# Patient Record
Sex: Male | Born: 1940
Health system: Southern US, Community
[De-identification: ages and names within clinical notes are randomized; demographics above are authoritative.]

## PROBLEM LIST (undated history)

## (undated) DIAGNOSIS — R310 Gross hematuria: Secondary | ICD-10-CM

## (undated) DIAGNOSIS — N401 Enlarged prostate with lower urinary tract symptoms: Secondary | ICD-10-CM

## (undated) DIAGNOSIS — I259 Chronic ischemic heart disease, unspecified: Secondary | ICD-10-CM

## (undated) DIAGNOSIS — K219 Gastro-esophageal reflux disease without esophagitis: Secondary | ICD-10-CM

## (undated) DIAGNOSIS — N281 Cyst of kidney, acquired: Secondary | ICD-10-CM

## (undated) DIAGNOSIS — E785 Hyperlipidemia, unspecified: Secondary | ICD-10-CM

## (undated) DIAGNOSIS — Z8719 Personal history of other diseases of the digestive system: Secondary | ICD-10-CM

## (undated) DIAGNOSIS — G4730 Sleep apnea, unspecified: Secondary | ICD-10-CM

## (undated) DIAGNOSIS — N200 Calculus of kidney: Secondary | ICD-10-CM

## (undated) DIAGNOSIS — I219 Acute myocardial infarction, unspecified: Secondary | ICD-10-CM

## (undated) DIAGNOSIS — N138 Other obstructive and reflux uropathy: Secondary | ICD-10-CM

## (undated) DIAGNOSIS — I2699 Other pulmonary embolism without acute cor pulmonale: Secondary | ICD-10-CM

## (undated) DIAGNOSIS — M1990 Unspecified osteoarthritis, unspecified site: Secondary | ICD-10-CM

## (undated) DIAGNOSIS — I2510 Atherosclerotic heart disease of native coronary artery without angina pectoris: Secondary | ICD-10-CM

## (undated) DIAGNOSIS — J189 Pneumonia, unspecified organism: Secondary | ICD-10-CM

## (undated) DIAGNOSIS — R972 Elevated prostate specific antigen [PSA]: Secondary | ICD-10-CM

## (undated) DIAGNOSIS — N529 Male erectile dysfunction, unspecified: Secondary | ICD-10-CM

## (undated) DIAGNOSIS — I499 Cardiac arrhythmia, unspecified: Secondary | ICD-10-CM

## (undated) DIAGNOSIS — R319 Hematuria, unspecified: Secondary | ICD-10-CM

## (undated) DIAGNOSIS — I509 Heart failure, unspecified: Secondary | ICD-10-CM

## (undated) DIAGNOSIS — D510 Vitamin B12 deficiency anemia due to intrinsic factor deficiency: Secondary | ICD-10-CM

## (undated) DIAGNOSIS — Z9289 Personal history of other medical treatment: Secondary | ICD-10-CM

## (undated) DIAGNOSIS — I10 Essential (primary) hypertension: Secondary | ICD-10-CM

## (undated) DIAGNOSIS — Z87442 Personal history of urinary calculi: Secondary | ICD-10-CM

## (undated) DIAGNOSIS — G629 Polyneuropathy, unspecified: Secondary | ICD-10-CM

## (undated) DIAGNOSIS — M4800 Spinal stenosis, site unspecified: Secondary | ICD-10-CM

## (undated) HISTORY — PX: BACK SURGERY: SHX140

## (undated) HISTORY — PX: OTHER SURGICAL HISTORY: SHX169

## (undated) HISTORY — DX: Other obstructive and reflux uropathy: N13.8

## (undated) HISTORY — DX: Hyperlipidemia, unspecified: E78.5

## (undated) HISTORY — DX: Other pulmonary embolism without acute cor pulmonale: I26.99

## (undated) HISTORY — DX: Hematuria, unspecified: R31.9

## (undated) HISTORY — DX: Vitamin B12 deficiency anemia due to intrinsic factor deficiency: D51.0

## (undated) HISTORY — DX: Spinal stenosis, site unspecified: M48.00

## (undated) HISTORY — DX: Chronic ischemic heart disease, unspecified: I25.9

## (undated) HISTORY — DX: Cardiac arrhythmia, unspecified: I49.9

## (undated) HISTORY — DX: Atherosclerotic heart disease of native coronary artery without angina pectoris: I25.10

## (undated) HISTORY — DX: Calculus of kidney: N20.0

## (undated) HISTORY — DX: Male erectile dysfunction, unspecified: N52.9

## (undated) HISTORY — DX: Other obstructive and reflux uropathy: N40.1

## (undated) HISTORY — DX: Personal history of urinary calculi: Z87.442

## (undated) HISTORY — DX: Sleep apnea, unspecified: G47.30

## (undated) HISTORY — DX: Elevated prostate specific antigen (PSA): R97.20

## (undated) HISTORY — PX: LAMINECTOMY: SHX219

## (undated) HISTORY — PX: CHOLECYSTECTOMY: SHX55

## (undated) HISTORY — DX: Heart failure, unspecified: I50.9

## (undated) HISTORY — DX: Morbid (severe) obesity due to excess calories: E66.01

## (undated) HISTORY — DX: Cyst of kidney, acquired: N28.1

## (undated) HISTORY — DX: Gross hematuria: R31.0

## (undated) HISTORY — DX: Polyneuropathy, unspecified: G62.9

## (undated) HISTORY — PX: APPENDECTOMY: SHX54

---

## 1989-09-06 DIAGNOSIS — I259 Chronic ischemic heart disease, unspecified: Secondary | ICD-10-CM

## 1989-09-06 HISTORY — DX: Chronic ischemic heart disease, unspecified: I25.9

## 1993-09-06 DIAGNOSIS — J189 Pneumonia, unspecified organism: Secondary | ICD-10-CM

## 1993-09-06 HISTORY — DX: Pneumonia, unspecified organism: J18.9

## 2005-07-23 ENCOUNTER — Ambulatory Visit: Payer: Self-pay | Admitting: Urology

## 2005-08-10 ENCOUNTER — Other Ambulatory Visit: Payer: Self-pay

## 2005-08-10 ENCOUNTER — Ambulatory Visit: Payer: Self-pay | Admitting: Surgery

## 2005-08-13 ENCOUNTER — Ambulatory Visit: Payer: Self-pay | Admitting: Surgery

## 2006-01-25 ENCOUNTER — Ambulatory Visit: Payer: Self-pay | Admitting: Internal Medicine

## 2006-04-23 ENCOUNTER — Inpatient Hospital Stay: Payer: Self-pay | Admitting: Internal Medicine

## 2006-04-23 ENCOUNTER — Other Ambulatory Visit: Payer: Self-pay

## 2006-04-24 ENCOUNTER — Other Ambulatory Visit: Payer: Self-pay

## 2006-04-25 ENCOUNTER — Other Ambulatory Visit: Payer: Self-pay

## 2006-06-08 ENCOUNTER — Ambulatory Visit: Payer: Self-pay | Admitting: Unknown Physician Specialty

## 2006-09-22 ENCOUNTER — Ambulatory Visit: Payer: Self-pay | Admitting: Pain Medicine

## 2006-10-05 ENCOUNTER — Ambulatory Visit: Payer: Self-pay | Admitting: Pain Medicine

## 2006-11-10 ENCOUNTER — Ambulatory Visit: Payer: Self-pay | Admitting: Pain Medicine

## 2006-11-21 ENCOUNTER — Ambulatory Visit: Payer: Self-pay | Admitting: Pain Medicine

## 2006-12-20 ENCOUNTER — Ambulatory Visit: Payer: Self-pay | Admitting: Pain Medicine

## 2007-03-28 ENCOUNTER — Ambulatory Visit: Payer: Self-pay | Admitting: Internal Medicine

## 2007-04-12 ENCOUNTER — Ambulatory Visit: Payer: Self-pay | Admitting: Unknown Physician Specialty

## 2007-05-10 ENCOUNTER — Ambulatory Visit: Payer: Self-pay | Admitting: Neurology

## 2007-07-26 ENCOUNTER — Other Ambulatory Visit: Payer: Self-pay

## 2007-07-26 ENCOUNTER — Ambulatory Visit: Payer: Self-pay | Admitting: Unknown Physician Specialty

## 2007-07-31 ENCOUNTER — Inpatient Hospital Stay: Payer: Self-pay | Admitting: Unknown Physician Specialty

## 2008-05-06 ENCOUNTER — Ambulatory Visit: Payer: Self-pay | Admitting: Urology

## 2008-09-06 DIAGNOSIS — I2510 Atherosclerotic heart disease of native coronary artery without angina pectoris: Secondary | ICD-10-CM

## 2008-09-06 HISTORY — DX: Atherosclerotic heart disease of native coronary artery without angina pectoris: I25.10

## 2009-05-13 ENCOUNTER — Inpatient Hospital Stay: Payer: Self-pay | Admitting: Internal Medicine

## 2009-05-14 ENCOUNTER — Encounter: Payer: Self-pay | Admitting: Cardiovascular Disease

## 2009-05-15 ENCOUNTER — Encounter: Payer: Self-pay | Admitting: Cardiovascular Disease

## 2010-04-01 ENCOUNTER — Ambulatory Visit: Payer: Self-pay | Admitting: Unknown Physician Specialty

## 2010-04-06 ENCOUNTER — Ambulatory Visit: Payer: Self-pay | Admitting: Oncology

## 2010-04-20 ENCOUNTER — Ambulatory Visit: Payer: Self-pay | Admitting: Unknown Physician Specialty

## 2010-04-23 ENCOUNTER — Inpatient Hospital Stay: Payer: Self-pay | Admitting: Unknown Physician Specialty

## 2010-04-25 ENCOUNTER — Encounter: Payer: Self-pay | Admitting: Cardiovascular Disease

## 2010-04-26 ENCOUNTER — Encounter: Payer: Self-pay | Admitting: Cardiovascular Disease

## 2010-05-01 ENCOUNTER — Ambulatory Visit: Payer: Self-pay | Admitting: Cardiovascular Disease

## 2010-05-07 ENCOUNTER — Ambulatory Visit: Payer: Self-pay | Admitting: Oncology

## 2010-05-15 ENCOUNTER — Ambulatory Visit: Payer: Self-pay | Admitting: Oncology

## 2010-05-28 ENCOUNTER — Ambulatory Visit: Payer: Self-pay | Admitting: Cardiovascular Disease

## 2010-05-28 DIAGNOSIS — E785 Hyperlipidemia, unspecified: Secondary | ICD-10-CM

## 2010-05-28 DIAGNOSIS — I2510 Atherosclerotic heart disease of native coronary artery without angina pectoris: Secondary | ICD-10-CM | POA: Insufficient documentation

## 2010-05-28 DIAGNOSIS — I4891 Unspecified atrial fibrillation: Secondary | ICD-10-CM | POA: Insufficient documentation

## 2010-06-06 ENCOUNTER — Ambulatory Visit: Payer: Self-pay | Admitting: Oncology

## 2010-10-06 NOTE — Assessment & Plan Note (Signed)
Summary: NP6/AMD   Visit Type:  Initial Consult Primary Alexandrya Chim:  Darrol Poke.  CC:  F/U [facility] from back surgery with an abnormal EKG..  History of Present Illness: Mr. Niess is a 70 year old gentleman with a history of coronary artery disease, PTCA in 1991 of his RCA that later occluded 100%, with chest pain in September 2010 and stent placed to his LAD, also a history of atrial fibrillation following recent back surgery in the spontaneously converted with improved pain control presents to establish care. he does have diabetes and complications including lower extremity neuropathy.  He reports that since his discharge following his back surgery for lumbar spinal stenosis and DJD, he has felt very fatigued. He did participate in physical therapy though could not afford to keep going with this treatment. He denies any chest pain, shortness of breath, lower extremity edema. He denies any tachycardia palpitations. He has not had atrial fibrillation before 05/2010. He has not been taking his simvastatin and asked to take a "break "once in a while. He is unable to tolerate aspirin due to her history of GI problems 20 years ago  Cardiac catheterization September 2000 and details a 75% proximal LAD lesion, 100% RCA lesion, 30% left circumflex lesion a xience 2.75 x 15 mm DES stent was placed.  EKG shows normal sinus rhythm with a rate of 89 beats per minute, no significant ST or T wave changes    Preventive Screening-Counseling & Management  Alcohol-Tobacco     Smoking Status: never  Caffeine-Diet-Exercise     Does Patient Exercise: yes      Drug Use:  no.    Current Medications (verified): 1)  B-12 Injection .... One Monthly 2)  Effient 10 Mg Tabs (Prasugrel Hcl) .... One Tablet Once Daily 3)  Th Fish Oil Extra Strength 1200 Mg Caps (Omega-3 Fatty Acids) .... Two Tablets Once Daily 4)  Glimepiride 4 Mg Tabs (Glimepiride) .... One Tablet Once Daily 5)  Hydrochlorothiazide 25 Mg Tabs  (Hydrochlorothiazide) .... One Tablet Once Daily 6)  Omeprazole 20 Mg Cpdr (Omeprazole) .... One Tablet Once Daily 7)  Percocet 5-325 Mg Tabs (Oxycodone-Acetaminophen) .Marland Kitchen.. 1 Tablet Every Four Hours As Needed 8)  Simvastatin 40 Mg Tabs (Simvastatin) .... One Tablet Once Daily 9)  Valium 5 Mg Tabs (Diazepam) .... One Tablet As Needed 10)  Viagra 100 Mg Tabs (Sildenafil Citrate) .... One Tablet Once Daily As Needed 11)  Vitamin D 2000 I.u. .... One Tablet Daily  Allergies (verified): 1)  ! * Nitroglycerin 2)  ! * Novacaine  Past History:  Family History: Last updated: 06-18-10 Mother: CHF; deceased Father:  Deceased   Siblings: 1 Brother deceased MI age 56                3 living sisters; 2 with tachycardia.  Social History: Last updated: 18-Jun-2010 Married  Retired  Tobacco Use - No.  Alcohol Use - yes--rare Regular Exercise - yes--walks. Drug Use - no  Risk Factors: Exercise: yes (2010-06-18)  Risk Factors: Smoking Status: never (June 18, 2010)  Past Medical History: CAD; 2010 (stent) Ischemic heart disease with an angioplasty in 1991 Hyperlipidemia spinal stenosis pernicious anemia peripheral neuropathy elevated PSA previous pulmonary embolus morbid obesity sleep apnea kidney stones diabetes  Past Surgical History: CAD; stent 2010 Bilateral inguinal hernia repair cholecystectomy appendectomy laminectomy  Family History: Mother: CHF; deceased Father:  Deceased   Siblings: 1 Brother deceased MI age 24  3 living sisters; 2 with tachycardia.  Social History: Married  Retired  Tobacco Use - No.  Alcohol Use - yes--rare Regular Exercise - yes--walks. Drug Use - no Smoking Status:  never Does Patient Exercise:  yes Drug Use:  no  Review of Systems       The patient complains of dyspnea on exertion.  The patient denies fever, weight loss, weight gain, vision loss, decreased hearing, hoarseness, chest pain, syncope, peripheral edema,  prolonged cough, abdominal pain, incontinence, muscle weakness, depression, and enlarged lymph nodes.         Fatigue and back pain  Vital Signs:  Patient profile:   70 year old male Height:      73 inches Weight:      277 pounds BMI:     36.68 Pulse rate:   89 / minute BP sitting:   152 / 77  (left arm) Cuff size:   large  Vitals Entered By: Bishop Dublin, CMA (May 28, 2010 11:00 AM)  Physical Exam  General:  Well developed, well nourished, in no acute distress. Head:  normocephalic and atraumatic Neck:  Neck supple, no JVD. No masses, thyromegaly or abnormal cervical nodes. Lungs:  Clear bilaterally to auscultation and percussion. Heart:  Non-displaced PMI, chest non-tender; regular rate and rhythm, S1, S2 without murmurs, rubs or gallops. Carotid upstroke normal, no bruit. Pedals normal pulses. No edema, no varicosities. Abdomen:   abdomen soft and non-tender without masses, obese Msk:  Back normal, normal gait. Muscle strength and tone normal. Pulses:  diminished pulses in his lower extremities, trace to 1+ bilaterally Extremities:  No clubbing or cyanosis. Neurologic:  Alert and oriented x 3. Skin:  Intact without lesions or rashes. Psych:  Normal affect.   Impression & Recommendations:  Problem # 1:  CAD (ICD-414.00) history of coronary artery disease. He has not been taking his simvastatin. We have encouraged him to restart his simvastatin. Will try to obtain his most recent lipid panel for our records  His updated medication list for this problem includes:    Effient 10 Mg Tabs (Prasugrel hcl) ..... One tablet once daily    Metoprolol Tartrate 25 Mg Tabs (Metoprolol tartrate) .Marland Kitchen... Take one tablet by mouth twice a day  Problem # 2:  ATRIAL FIBRILLATION (ICD-427.31) Postop mitral fibrillation following back surgery. We will start him on metoprolol 25 mg b.i.d. for improved rhythm control. His wife reports that he reaches heart rates in the 100s with minimal  exertion.  His updated medication list for this problem includes:    Effient 10 Mg Tabs (Prasugrel hcl) ..... One tablet once daily    Metoprolol Tartrate 25 Mg Tabs (Metoprolol tartrate) .Marland Kitchen... Take one tablet by mouth twice a day  Problem # 3:  HYPERLIPIDEMIA-MIXED (ICD-272.4) He has not been taking his simvastatin. We have encouraged him to restart the medication.  His updated medication list for this problem includes:    Simvastatin 40 Mg Tabs (Simvastatin) ..... One tablet once daily  Patient Instructions: 1)  Your physician has recommended you make the following change in your medication: START Metoprolol two times a day  2)  Your physician wants you to follow-up in:  6 months  You will receive a reminder letter in the mail two months in advance. If you don't receive a letter, please call our office to schedule the follow-up appointment. Prescriptions: METOPROLOL TARTRATE 25 MG TABS (METOPROLOL TARTRATE) Take one tablet by mouth twice a day  #180 x 4   Entered  by:   Benedict Needy, RN   Authorized by:   Dossie Arbour MD   Signed by:   Benedict Needy, RN on 05/28/2010   Method used:   Electronically to        [facility]  [address] (retail)       [address]       [place], [place]  [address]       Ph: [phone]       Fax: [phone]   RxID:   [id number]   Appended Document: NP6/AMD notes indicate he has a history of sleep apnea and previous DVT  Echocardiogram done at outside facility shows normal systolic function, atria of normal size no significant valvular disease. This was done prior to his back surgery by Dr. Juel Burrow

## 2010-10-06 NOTE — Letter (Signed)
Summary: [facility] - Cardiac Cath  [facility] - Cardiac Cath   Imported By: Marylou Mccoy 06/30/2010 14:15:11  _____________________________________________________________________  External Attachment:    Type:   Image     Comment:   External Document

## 2010-10-06 NOTE — Letter (Signed)
Summary: Patient Receipt of NPP  Patient Receipt of NPP   Imported By: Marylou Mccoy 06/09/2010 13:59:27  _____________________________________________________________________  External Attachment:    Type:   Image     Comment:   External Document

## 2010-10-06 NOTE — Letter (Signed)
Summary: [facility] - Cardiac Cath  [facility] - Cardiac Cath   Imported By: Marylou Mccoy 06/30/2010 14:16:00  _____________________________________________________________________  External Attachment:    Type:   Image     Comment:   External Document

## 2011-03-24 ENCOUNTER — Encounter: Payer: Self-pay | Admitting: Cardiology

## 2011-05-19 ENCOUNTER — Ambulatory Visit (INDEPENDENT_AMBULATORY_CARE_PROVIDER_SITE_OTHER): Payer: Medicare Other | Admitting: Cardiovascular Disease

## 2011-05-19 ENCOUNTER — Encounter: Payer: Self-pay | Admitting: Cardiovascular Disease

## 2011-05-19 VITALS — BP 152/76 | HR 73 | Ht 73.0 in | Wt 289.0 lb

## 2011-05-19 DIAGNOSIS — E785 Hyperlipidemia, unspecified: Secondary | ICD-10-CM

## 2011-05-19 DIAGNOSIS — I2510 Atherosclerotic heart disease of native coronary artery without angina pectoris: Secondary | ICD-10-CM

## 2011-05-19 DIAGNOSIS — I4891 Unspecified atrial fibrillation: Secondary | ICD-10-CM

## 2011-05-19 MED ORDER — CLOPIDOGREL BISULFATE 75 MG PO TABS: 75.0000 mg | ORAL_TABLET | Freq: Every day | ORAL | Status: DC

## 2011-05-19 NOTE — Assessment & Plan Note (Signed)
He is currently on simvastatin. Goal LDL less than 70. Cholesterol is monitored by Dr. Yates Decamp.

## 2011-05-19 NOTE — Assessment & Plan Note (Signed)
Maintaining normal sinus rhythm. Off warfarin 

## 2011-05-19 NOTE — Progress Notes (Signed)
Patient ID: Cody Price, male    DOB: 1940-10-01, 70 y.o.   MRN: [id number]  HPI Comments: Cody Price is a 70 year old gentleman with a history of coronary artery disease, PTCA in 1991 of his RCA that later occluded 100%, with chest pain in September 2010 and stent placed to his LAD, also a history of atrial fibrillation following recent back surgery in the spontaneously converted with improved pain control presents to establish care. he does have diabetes and complications including lower extremity neuropathy.    back surgery for lumbar spinal stenosis and DJD, he has felt very fatigued.  He is unable to tolerate aspirin due to her history of GI problems 20 years ago He stopped the metoprolol on his own as he reported having fatigue.  He states that his blood pressure typically runs mildly elevated with systolic pressures in the 140, occasionally 150 range.  He does state that his sugars have been running a little bit high.  He denies any significant nausea or chest discomfort or chest tightness or heaviness. This was his previous anginal equivalent.   Cardiac catheterization September 2000 and details a 75% proximal LAD lesion, 100% RCA lesion, 30% left circumflex lesion a xience 2.75 x 15 mm DES stent was placed.   EKG shows normal sinus rhythm with a rate of 71 beats per minute, no significant ST or T wave changes        Outpatient Encounter Prescriptions as of 05/19/2011  Medication Sig Dispense Refill  . Cholecalciferol (VITAMIN D) 2000 UNITS CAPS Take by mouth daily.        . Coenzyme Q10 (CO Q-10 PO) Take by mouth.        . Cyanocobalamin (VITAMIN B-12 IJ) Inject as directed every 30 (thirty) days.        . diazepam (VALIUM) 5 MG tablet Take 5 mg by mouth as needed.        Marland Kitchen glimepiride (AMARYL) 4 MG tablet Take 4 mg by mouth daily.        . hydrochlorothiazide 25 MG tablet Take 25 mg by mouth daily.        . Omega-3 Fatty Acids (TH FISH OIL EXTRA STRENGTH) 1200 MG CAPS Take  2 tablets by mouth daily.        Marland Kitchen omeprazole (PRILOSEC) 20 MG capsule Take 20 mg by mouth daily.        Marland Kitchen oxyCODONE-acetaminophen (PERCOCET) 5-325 MG per tablet Take 1 tablet by mouth every 4 (four) hours as needed.        . sildenafil (VIAGRA) 100 MG tablet Take 100 mg by mouth daily as needed.        . simvastatin (ZOCOR) 40 MG tablet Take 40 mg by mouth daily.        Marland Kitchen DISCONTD: prasugrel (EFFIENT) 10 MG TABS Take by mouth daily.        . clopidogrel (PLAVIX) 75 MG tablet Take 1 tablet (75 mg total) by mouth daily.  30 tablet  11  . DISCONTD: metoprolol tartrate (LOPRESSOR) 25 MG tablet Take 25 mg by mouth 2 (two) times daily.           Review of Systems  Constitutional: Negative.   HENT: Negative.   Eyes: Negative.   Respiratory: Negative.   Cardiovascular: Negative.   Gastrointestinal: Negative.   Musculoskeletal: Negative.   Skin: Negative.   Neurological: Negative.   Hematological: Negative.   Psychiatric/Behavioral: Negative.   All other systems reviewed and are negative.  BP 152/76  Pulse 73  Ht 6\' 1"  (1.854 m)  Wt 289 lb (131.09 kg)  BMI 38.13 kg/m2 Recheck of his blood pressure showed systolic pressure in the low 140s over 70  Physical Exam  Nursing note and vitals reviewed. Constitutional: He is oriented to person, place, and time. He appears well-developed and well-nourished.  HENT:  Head: Normocephalic.  Nose: Nose normal.  Mouth/Throat: Oropharynx is clear and moist.  Eyes: Conjunctivae are normal. Pupils are equal, round, and reactive to light.  Neck: Normal range of motion. Neck supple. No JVD present.  Cardiovascular: Normal rate, regular rhythm, S1 normal, S2 normal, normal heart sounds and intact distal pulses.  Exam reveals no gallop and no friction rub.   No murmur heard. Pulmonary/Chest: Effort normal and breath sounds normal. No respiratory distress. He has no wheezes. He has no rales. He exhibits no tenderness.  Abdominal: Soft. Bowel sounds  are normal. He exhibits no distension. There is no tenderness.  Musculoskeletal: Normal range of motion. He exhibits no edema and no tenderness.  Lymphadenopathy:    He has no cervical adenopathy.  Neurological: He is alert and oriented to person, place, and time. Coordination normal.  Skin: Skin is warm and dry. No rash noted. No erythema.  Psychiatric: He has a normal mood and affect. His behavior is normal. Judgment and thought content normal.           Assessment and Plan

## 2011-05-19 NOTE — Assessment & Plan Note (Addendum)
Currently with no symptoms of angina. No further workup at this time. Continue current medication regimen. He did comment that the effient is very expensive. Will change him to Plavix at his request. There could be a slight interaction between omeprazole and Plavix though he is now 2 years out from his stent. He is unable to tolerate aspirin, even 81 mg secondary to GI irritation.

## 2011-05-19 NOTE — Patient Instructions (Addendum)
You are doing well. Please stop the effient Start plavix one daily Please call us if you have new issues that need to be addressed before your next appt.  We will call you for a follow up Appt. In 12 months

## 2012-04-21 ENCOUNTER — Encounter: Payer: Self-pay | Admitting: *Deleted

## 2012-04-21 NOTE — Telephone Encounter (Signed)
error 

## 2012-06-07 ENCOUNTER — Telehealth: Payer: Self-pay | Admitting: Cardiovascular Disease

## 2012-06-07 NOTE — Telephone Encounter (Signed)
Pt wife calling pt needs an epidural in his back. Pt needs to stop plavix 7 days prior is pt ok to do this. Gavin Potters [phone] Dr Yves Dill

## 2012-06-07 NOTE — Telephone Encounter (Signed)
Has appt with you 10/15 Ok to hold Plavix?

## 2012-06-07 NOTE — Telephone Encounter (Signed)
Ok to hold plavix and asa for procedure

## 2012-06-08 NOTE — Telephone Encounter (Signed)
Letter faxed to [phone]

## 2012-06-11 ENCOUNTER — Other Ambulatory Visit: Payer: Self-pay | Admitting: Cardiovascular Disease

## 2012-06-12 NOTE — Telephone Encounter (Signed)
Has appt with Dr. Mariah Milling 06/20/12 Will send in 1 refill

## 2012-06-20 ENCOUNTER — Encounter: Payer: Self-pay | Admitting: Cardiovascular Disease

## 2012-06-20 ENCOUNTER — Ambulatory Visit (INDEPENDENT_AMBULATORY_CARE_PROVIDER_SITE_OTHER): Payer: Medicare Other | Admitting: Cardiovascular Disease

## 2012-06-20 VITALS — BP 152/80 | HR 62 | Ht 74.0 in | Wt 280.0 lb

## 2012-06-20 DIAGNOSIS — R079 Chest pain, unspecified: Secondary | ICD-10-CM

## 2012-06-20 DIAGNOSIS — I2510 Atherosclerotic heart disease of native coronary artery without angina pectoris: Secondary | ICD-10-CM

## 2012-06-20 DIAGNOSIS — I4891 Unspecified atrial fibrillation: Secondary | ICD-10-CM

## 2012-06-20 DIAGNOSIS — E785 Hyperlipidemia, unspecified: Secondary | ICD-10-CM

## 2012-06-20 DIAGNOSIS — E119 Type 2 diabetes mellitus without complications: Secondary | ICD-10-CM

## 2012-06-20 MED ORDER — LISINOPRIL 10 MG PO TABS: 10.0000 mg | ORAL_TABLET | Freq: Every day | ORAL | Status: DC

## 2012-06-20 NOTE — Progress Notes (Signed)
Patient ID: Cody Price, male    DOB: 10-29-1940, 71 y.o.   MRN: [id number]  HPI Comments: Cody Price is a 71 year old gentleman with a history of coronary artery disease, PTCA in 1991 of his RCA that later occluded 100%, with chest pain in September 2010 and stent placed to his LAD, also a history of atrial fibrillation following recent back surgery in the spontaneously converted with improved pain control presents to establish care. he does have diabetes and complications including lower extremity neuropathy.   s/p back surgery for lumbar spinal stenosis and DJD,   He is unable to tolerate aspirin due to her history of GI problems 20 years ago, erosion He stopped the metoprolol on his own as he reported having fatigue.  Hemoglobin A1c 6.3 Total cholesterol 114, LDL 43 He denies any significant nausea or chest discomfort or chest tightness or heaviness. This was his previous anginal equivalent. He had epidural shots in his back last week with no significant improvement in his symptoms.   Cardiac catheterization September 2000 and details a 75% proximal LAD lesion, 100% RCA lesion, 30% left circumflex lesion a xience 2.75 x 15 mm DES stent was placed.   EKG shows normal sinus rhythm with a rate of 62 beats per minute, no significant ST or T wave changes      Outpatient Encounter Prescriptions as of 06/20/2012  Medication Sig Dispense Refill  . Cholecalciferol (VITAMIN D) 2000 UNITS CAPS Take by mouth daily.        . clopidogrel (PLAVIX) 75 MG tablet TAKE ONE TABLET BY MOUTH EVERY DAY  30 tablet  1  . Coenzyme Q10 (CO Q-10 PO) Take by mouth.        . Cyanocobalamin (VITAMIN B-12 IJ) Inject as directed every 30 (thirty) days.        . diazepam (VALIUM) 5 MG tablet Take 5 mg by mouth as needed.        Marland Kitchen glimepiride (AMARYL) 4 MG tablet Take 4 mg by mouth daily.        . hydrochlorothiazide 25 MG tablet Take 25 mg by mouth daily.        . Omega-3 Fatty Acids (TH FISH OIL EXTRA STRENGTH)  1200 MG CAPS Take 2 tablets by mouth daily.        Marland Kitchen omeprazole (PRILOSEC) 20 MG capsule Take 20 mg by mouth daily.        Marland Kitchen oxyCODONE-acetaminophen (PERCOCET) 5-325 MG per tablet Take 1 tablet by mouth every 4 (four) hours as needed.        . sildenafil (VIAGRA) 100 MG tablet Take 100 mg by mouth daily as needed.        . simvastatin (ZOCOR) 40 MG tablet Take 40 mg by mouth daily.        Marland Kitchen lisinopril (PRINIVIL,ZESTRIL) 10 MG tablet Take 1 tablet (10 mg total) by mouth daily.  90 tablet  3     Review of Systems  Constitutional: Negative.   HENT: Negative.   Eyes: Negative.   Respiratory: Negative.   Cardiovascular: Negative.   Gastrointestinal: Negative.   Musculoskeletal: Positive for back pain.  Skin: Negative.   Neurological: Negative.   Hematological: Negative.   Psychiatric/Behavioral: Negative.   All other systems reviewed and are negative.    BP 152/80  Pulse 62  Ht 6\' 2"  (1.88 m)  Wt 280 lb (127.007 kg)  BMI 35.95 kg/m2  Physical Exam  Nursing note and vitals reviewed. Constitutional: He is oriented  to person, place, and time. He appears well-developed and well-nourished.  HENT:  Head: Normocephalic.  Nose: Nose normal.  Mouth/Throat: Oropharynx is clear and moist.  Eyes: Conjunctivae normal are normal. Pupils are equal, round, and reactive to light.  Neck: Normal range of motion. Neck supple. No JVD present.  Cardiovascular: Normal rate, regular rhythm, S1 normal, S2 normal, normal heart sounds and intact distal pulses.  Exam reveals no gallop and no friction rub.   No murmur heard. Pulmonary/Chest: Effort normal and breath sounds normal. No respiratory distress. He has no wheezes. He has no rales. He exhibits no tenderness.  Abdominal: Soft. Bowel sounds are normal. He exhibits no distension. There is no tenderness.  Musculoskeletal: Normal range of motion. He exhibits no edema and no tenderness.  Lymphadenopathy:    He has no cervical adenopathy.    Neurological: He is alert and oriented to person, place, and time. Coordination normal.  Skin: Skin is warm and dry. No rash noted. No erythema.  Psychiatric: He has a normal mood and affect. His behavior is normal. Judgment and thought content normal.           Assessment and Plan

## 2012-06-20 NOTE — Patient Instructions (Addendum)
You are doing well. Please start lisinopril 10 mg one a day  Please call us if you have new issues that need to be addressed before your next appt.  Your physician wants you to follow-up in: 12 months.  You will receive a reminder letter in the mail two months in advance. If you don't receive a letter, please call our office to schedule the follow-up appointment.

## 2012-06-20 NOTE — Assessment & Plan Note (Signed)
Maintaining normal sinus rhythm. No changes to his medications made

## 2012-06-20 NOTE — Assessment & Plan Note (Signed)
We have encouraged continued exercise, careful diet management in an effort to lose weight. Hemoglobin A1c well controlled 

## 2012-06-20 NOTE — Assessment & Plan Note (Signed)
Currently with no symptoms of angina. No further workup at this time. Continue current medication regimen. 

## 2012-06-20 NOTE — Assessment & Plan Note (Signed)
Cholesterol is at goal on the current lipid regimen. No changes to the medications were made.  

## 2012-07-06 ENCOUNTER — Ambulatory Visit: Payer: Self-pay | Admitting: Physical Medicine and Rehabilitation

## 2012-08-12 ENCOUNTER — Other Ambulatory Visit: Payer: Self-pay | Admitting: Cardiovascular Disease

## 2012-08-14 ENCOUNTER — Other Ambulatory Visit: Payer: Self-pay

## 2012-08-14 MED ORDER — CLOPIDOGREL BISULFATE 75 MG PO TABS: 75.0000 mg | ORAL_TABLET | Freq: Every day | ORAL | Status: DC

## 2012-08-14 NOTE — Telephone Encounter (Signed)
Refill sent for plavix  

## 2012-09-06 HISTORY — PX: CARPAL TUNNEL RELEASE: SHX101

## 2013-04-06 ENCOUNTER — Inpatient Hospital Stay: Payer: Self-pay | Admitting: Internal Medicine

## 2013-04-06 LAB — COMPREHENSIVE METABOLIC PANEL
Albumin: 4.1 g/dL (ref 3.4–5.0)
BUN: 14 mg/dL (ref 7–18)
Bilirubin,Total: 1.5 mg/dL — ABNORMAL HIGH (ref 0.2–1.0)
Chloride: 101 mmol/L (ref 98–107)
Co2: 28 mmol/L (ref 21–32)
Creatinine: 0.91 mg/dL (ref 0.60–1.30)
EGFR (African American): >60
Glucose: 178 mg/dL — ABNORMAL HIGH (ref 65–99)
Potassium: 3 mmol/L — ABNORMAL LOW (ref 3.5–5.1)
SGOT(AST): 18 U/L (ref 15–37)
SGPT (ALT): 16 U/L (ref 12–78)
Sodium: 136 mmol/L (ref 136–145)

## 2013-04-06 LAB — CBC
HGB: 14.8 g/dL (ref 13.0–18.0)
RDW: 13.8 % (ref 11.5–14.5)
WBC: 10.7 10*3/uL — ABNORMAL HIGH (ref 3.8–10.6)

## 2013-04-06 LAB — DIFFERENTIAL
Basophil #: 0 10*3/uL (ref 0.0–0.1)
Eosinophil #: 0.1 10*3/uL (ref 0.0–0.7)
Eosinophil %: 0.5 %
Lymphocyte %: 15.2 %
Monocyte #: 2.7 x10 3/mm — ABNORMAL HIGH (ref 0.2–1.0)

## 2013-04-06 LAB — URINALYSIS, COMPLETE
Bacteria: NONE SEEN
Bilirubin,UR: NEGATIVE
Blood: NEGATIVE
Glucose,UR: NEGATIVE mg/dL (ref 0–75)
Ketone: NEGATIVE
Leukocyte Esterase: NEGATIVE
Protein: NEGATIVE
RBC,UR: 1 /HPF (ref 0–5)
Specific Gravity: 1.059 (ref 1.003–1.030)
WBC UR: 1 /HPF (ref 0–5)

## 2013-04-06 LAB — MAGNESIUM: Magnesium: 1.5 mg/dL — ABNORMAL LOW

## 2013-04-07 LAB — CLOSTRIDIUM DIFFICILE BY PCR

## 2013-04-07 LAB — CBC WITH DIFFERENTIAL/PLATELET
Basophil #: 0.1 10*3/uL (ref 0.0–0.1)
Basophil %: 0.8 %
Eosinophil %: 0.8 %
HCT: 33.4 % — ABNORMAL LOW (ref 40.0–52.0)
HGB: 12.2 g/dL — ABNORMAL LOW (ref 13.0–18.0)
Lymphocyte #: 1.5 10*3/uL (ref 1.0–3.6)
MCH: 31.6 pg (ref 26.0–34.0)
MCHC: 36.6 g/dL — ABNORMAL HIGH (ref 32.0–36.0)
Monocyte #: 1.8 x10 3/mm — ABNORMAL HIGH (ref 0.2–1.0)
Monocyte %: 26.1 %
Neutrophil #: 3.5 10*3/uL (ref 1.4–6.5)
Neutrophil %: 50.2 %
Platelet: 74 10*3/uL — ABNORMAL LOW (ref 150–440)
RBC: 3.86 10*6/uL — ABNORMAL LOW (ref 4.40–5.90)
RDW: 14.4 % (ref 11.5–14.5)
WBC: 7 10*3/uL (ref 3.8–10.6)

## 2013-04-07 LAB — BASIC METABOLIC PANEL
BUN: 8 mg/dL (ref 7–18)
Calcium, Total: 8.3 mg/dL — ABNORMAL LOW (ref 8.5–10.1)
Chloride: 106 mmol/L (ref 98–107)
Co2: 28 mmol/L (ref 21–32)
Glucose: 132 mg/dL — ABNORMAL HIGH (ref 65–99)
Osmolality: 278 (ref 275–301)
Potassium: 3.2 mmol/L — ABNORMAL LOW (ref 3.5–5.1)
Sodium: 139 mmol/L (ref 136–145)

## 2013-04-07 LAB — MAGNESIUM: Magnesium: 1.8 mg/dL

## 2013-04-10 LAB — STOOL CULTURE

## 2013-05-16 ENCOUNTER — Other Ambulatory Visit: Payer: Self-pay | Admitting: Cardiovascular Disease

## 2013-05-16 NOTE — Telephone Encounter (Signed)
Refilled Plavix sent to [facility] pharmacy.

## 2013-06-20 ENCOUNTER — Ambulatory Visit: Payer: Self-pay | Admitting: Neurosurgery

## 2013-06-21 ENCOUNTER — Ambulatory Visit (INDEPENDENT_AMBULATORY_CARE_PROVIDER_SITE_OTHER): Payer: Medicare Other | Admitting: Cardiovascular Disease

## 2013-06-21 ENCOUNTER — Encounter: Payer: Self-pay | Admitting: Cardiovascular Disease

## 2013-06-21 VITALS — BP 140/62 | HR 89 | Ht 74.0 in | Wt 264.2 lb

## 2013-06-21 DIAGNOSIS — G8929 Other chronic pain: Secondary | ICD-10-CM

## 2013-06-21 DIAGNOSIS — E119 Type 2 diabetes mellitus without complications: Secondary | ICD-10-CM

## 2013-06-21 DIAGNOSIS — I2510 Atherosclerotic heart disease of native coronary artery without angina pectoris: Secondary | ICD-10-CM

## 2013-06-21 DIAGNOSIS — M549 Dorsalgia, unspecified: Secondary | ICD-10-CM

## 2013-06-21 DIAGNOSIS — E785 Hyperlipidemia, unspecified: Secondary | ICD-10-CM

## 2013-06-21 DIAGNOSIS — I4891 Unspecified atrial fibrillation: Secondary | ICD-10-CM

## 2013-06-21 NOTE — Progress Notes (Signed)
Patient ID: Cody Price, male    DOB: 12-29-1940, 72 y.o.   MRN: [id number]  HPI Comments: Cody Price is a 72 year old gentleman with a history of coronary artery disease, PTCA in 1991 of his RCA that later occluded 100%, with chest pain in September 2010 and stent placed to his LAD, also a history of atrial fibrillation following recent back surgery in the spontaneously converted with improved pain control presents to establish care. he does have diabetes and complications including lower extremity neuropathy.   s/p back surgery for lumbar spinal stenosis and DJD,   He is unable to tolerate aspirin due to her history of GI problems 20 years ago, erosion He stopped the metoprolol on his own as he reported having fatigue.  Lab work from 2013 :  Hemoglobin A1c 6.3 Total cholesterol 114, LDL 43 He denies any significant nausea or chest discomfort or chest tightness or heaviness. This was his previous anginal equivalent.  His back continues to be a major issue. Recently at today flat for 2 hours for MRA. Is being seen in [place] for his back disease.  He reports having gastroenteritis in August 2014. In the hospital for dehydration also having problems with his hands, possible carpal tunnel  Cardiac catheterization September 2000 and details a 75% proximal LAD lesion, 100% RCA lesion, 30% left circumflex lesion a xience 2.75 x 15 mm DES stent was placed.   EKG shows normal sinus rhythm with a rate of 89 beats per minute, no significant ST or T wave changes      Outpatient Encounter Prescriptions as of 06/21/2013  Medication Sig Dispense Refill  . Cholecalciferol (VITAMIN D) 2000 UNITS CAPS Take by mouth daily.        . clopidogrel (PLAVIX) 75 MG tablet TAKE ONE TABLET BY MOUTH EVERY DAY  30 tablet  3  . Coenzyme Q10 (CO Q-10 PO) Take by mouth.        . Cyanocobalamin (VITAMIN B-12 IJ) Inject as directed every 30 (thirty) days.        . diazepam (VALIUM) 5 MG tablet Take 5 mg by mouth  as needed.        Marland Kitchen glimepiride (AMARYL) 4 MG tablet Take 4 mg by mouth daily.        . hydrochlorothiazide 25 MG tablet Take 25 mg by mouth daily.        Marland Kitchen lisinopril (PRINIVIL,ZESTRIL) 10 MG tablet Take 1 tablet (10 mg total) by mouth daily.  90 tablet  3  . Omega-3 Fatty Acids (TH FISH OIL EXTRA STRENGTH) 1200 MG CAPS Take 2 tablets by mouth daily.        Marland Kitchen omeprazole (PRILOSEC) 20 MG capsule Take 20 mg by mouth daily.        Marland Kitchen oxyCODONE-acetaminophen (PERCOCET) 5-325 MG per tablet Take 1 tablet by mouth every 4 (four) hours as needed.        . sildenafil (VIAGRA) 100 MG tablet Take 100 mg by mouth daily as needed.        . simvastatin (ZOCOR) 40 MG tablet Take 40 mg by mouth daily.         No facility-administered encounter medications on file as of 06/21/2013.    Review of Systems  Constitutional: Negative.   HENT: Negative.   Eyes: Negative.   Respiratory: Negative.   Cardiovascular: Negative.   Gastrointestinal: Negative.   Endocrine: Negative.   Musculoskeletal: Positive for arthralgias, back pain, neck pain and neck stiffness.  Pain in his hands  Skin: Negative.   Allergic/Immunologic: Negative.   Neurological: Negative.   Hematological: Negative.   Psychiatric/Behavioral: Negative.   All other systems reviewed and are negative.    BP 140/62  Pulse 89  Ht 6\' 2"  (1.88 m)  Wt 264 lb 4 oz (119.863 kg)  BMI 33.91 kg/m2  Physical Exam  Nursing note and vitals reviewed. Constitutional: He is oriented to person, place, and time. He appears well-developed and well-nourished.  HENT:  Head: Normocephalic.  Nose: Nose normal.  Mouth/Throat: Oropharynx is clear and moist.  Eyes: Conjunctivae are normal. Pupils are equal, round, and reactive to light.  Neck: Normal range of motion. Neck supple. No JVD present.  Cardiovascular: Normal rate, regular rhythm, S1 normal, S2 normal, normal heart sounds and intact distal pulses.  Exam reveals no gallop and no friction rub.    No murmur heard. Pulmonary/Chest: Effort normal and breath sounds normal. No respiratory distress. He has no wheezes. He has no rales. He exhibits no tenderness.  Abdominal: Soft. Bowel sounds are normal. He exhibits no distension. There is no tenderness.  Musculoskeletal: Normal range of motion. He exhibits no edema and no tenderness.  Lymphadenopathy:    He has no cervical adenopathy.  Neurological: He is alert and oriented to person, place, and time. Coordination normal.  Skin: Skin is warm and dry. No rash noted. No erythema.  Psychiatric: He has a normal mood and affect. His behavior is normal. Judgment and thought content normal.      Assessment and Plan

## 2013-06-21 NOTE — Assessment & Plan Note (Signed)
Sees [facility]

## 2013-06-21 NOTE — Assessment & Plan Note (Signed)
Currently with no symptoms of angina. No further workup at this time. Continue current medication regimen. 

## 2013-06-21 NOTE — Assessment & Plan Note (Signed)
We have encouraged continued exercise, careful diet management in an effort to lose weight. 

## 2013-06-21 NOTE — Patient Instructions (Signed)
You are doing well. No medication changes were made.  Please call us if you have new issues that need to be addressed before your next appt.  Your physician wants you to follow-up in: 12 months.  You will receive a reminder letter in the mail two months in advance. If you don't receive a letter, please call our office to schedule the follow-up appointment. 

## 2013-06-21 NOTE — Assessment & Plan Note (Signed)
   Maintaining NSR. 

## 2013-06-21 NOTE — Assessment & Plan Note (Signed)
Cholesterol is at goal on the current lipid regimen. No changes to the medications were made.  

## 2013-07-03 ENCOUNTER — Encounter: Payer: Self-pay | Admitting: *Deleted

## 2013-07-16 ENCOUNTER — Other Ambulatory Visit: Payer: Self-pay | Admitting: *Deleted

## 2013-07-16 ENCOUNTER — Other Ambulatory Visit: Payer: Self-pay | Admitting: Cardiovascular Disease

## 2013-07-16 MED ORDER — LISINOPRIL 10 MG PO TABS: 10.0000 mg | ORAL_TABLET | Freq: Every day | ORAL | Status: DC

## 2013-07-16 NOTE — Telephone Encounter (Signed)
Requested Prescriptions   Signed Prescriptions Disp Refills  . lisinopril (PRINIVIL,ZESTRIL) 10 MG tablet 90 tablet 3    Sig: Take 1 tablet (10 mg total) by mouth daily.    Authorizing Provider: GOLLAN, TIMOTHY J    Ordering User: LOPEZ, MARINA C    

## 2013-08-06 ENCOUNTER — Ambulatory Visit: Payer: Self-pay | Admitting: Unknown Physician Specialty

## 2013-10-18 ENCOUNTER — Other Ambulatory Visit: Payer: Self-pay | Admitting: Cardiovascular Disease

## 2013-10-25 ENCOUNTER — Ambulatory Visit: Payer: Self-pay | Admitting: Unknown Physician Specialty

## 2013-10-26 LAB — PATHOLOGY REPORT

## 2014-04-25 ENCOUNTER — Emergency Department: Payer: Self-pay | Admitting: Emergency Medicine

## 2014-04-25 LAB — COMPREHENSIVE METABOLIC PANEL
ALBUMIN: 3.9 g/dL (ref 3.4–5.0)
ALT: 13 U/L — AB
Alkaline Phosphatase: 64 U/L
Anion Gap: 10 (ref 7–16)
BILIRUBIN TOTAL: 1 mg/dL (ref 0.2–1.0)
BUN: 16 mg/dL (ref 7–18)
CALCIUM: 9.6 mg/dL (ref 8.5–10.1)
CHLORIDE: 101 mmol/L (ref 98–107)
Co2: 26 mmol/L (ref 21–32)
Creatinine: 1.11 mg/dL (ref 0.60–1.30)
EGFR (Non-African Amer.): >60
Glucose: 177 mg/dL — ABNORMAL HIGH (ref 65–99)
OSMOLALITY: 279 (ref 275–301)
POTASSIUM: 4 mmol/L (ref 3.5–5.1)
SGOT(AST): 16 U/L (ref 15–37)
Sodium: 137 mmol/L (ref 136–145)
Total Protein: 7.2 g/dL (ref 6.4–8.2)

## 2014-04-25 LAB — URINALYSIS, COMPLETE
BACTERIA: NONE SEEN
Bilirubin,UR: NEGATIVE
Blood: NEGATIVE
Glucose,UR: 50 mg/dL (ref 0–75)
KETONE: NEGATIVE
Leukocyte Esterase: NEGATIVE
Nitrite: NEGATIVE
PH: 5 (ref 4.5–8.0)
Protein: NEGATIVE
RBC,UR: 1 /HPF (ref 0–5)
Specific Gravity: 1.023 (ref 1.003–1.030)
Squamous Epithelial: 1

## 2014-04-25 LAB — CBC
HCT: 38.8 % — AB (ref 40.0–52.0)
HGB: 13.5 g/dL (ref 13.0–18.0)
MCH: 31.3 pg (ref 26.0–34.0)
MCHC: 34.9 g/dL (ref 32.0–36.0)
MCV: 90 fL (ref 80–100)
PLATELETS: 70 10*3/uL — AB (ref 150–440)
RBC: 4.32 10*6/uL — ABNORMAL LOW (ref 4.40–5.90)
RDW: 15 % — ABNORMAL HIGH (ref 11.5–14.5)
WBC: 11.4 10*3/uL — AB (ref 3.8–10.6)

## 2014-05-08 DIAGNOSIS — E669 Obesity, unspecified: Secondary | ICD-10-CM | POA: Insufficient documentation

## 2014-05-08 DIAGNOSIS — N200 Calculus of kidney: Secondary | ICD-10-CM | POA: Insufficient documentation

## 2014-05-08 DIAGNOSIS — E119 Type 2 diabetes mellitus without complications: Secondary | ICD-10-CM | POA: Insufficient documentation

## 2014-05-08 DIAGNOSIS — I10 Essential (primary) hypertension: Secondary | ICD-10-CM | POA: Insufficient documentation

## 2014-05-08 DIAGNOSIS — Z87442 Personal history of urinary calculi: Secondary | ICD-10-CM | POA: Insufficient documentation

## 2014-05-08 DIAGNOSIS — IMO0002 Reserved for concepts with insufficient information to code with codable children: Secondary | ICD-10-CM | POA: Insufficient documentation

## 2014-05-08 DIAGNOSIS — R972 Elevated prostate specific antigen [PSA]: Secondary | ICD-10-CM | POA: Insufficient documentation

## 2014-05-08 DIAGNOSIS — Z87898 Personal history of other specified conditions: Secondary | ICD-10-CM | POA: Insufficient documentation

## 2014-05-08 DIAGNOSIS — M4800 Spinal stenosis, site unspecified: Secondary | ICD-10-CM | POA: Insufficient documentation

## 2014-05-08 DIAGNOSIS — Z86711 Personal history of pulmonary embolism: Secondary | ICD-10-CM | POA: Insufficient documentation

## 2014-05-08 DIAGNOSIS — D649 Anemia, unspecified: Secondary | ICD-10-CM | POA: Insufficient documentation

## 2014-05-31 ENCOUNTER — Ambulatory Visit: Payer: Self-pay | Admitting: Neurosurgery

## 2014-06-12 ENCOUNTER — Other Ambulatory Visit (HOSPITAL_COMMUNITY): Payer: Self-pay | Admitting: Neurosurgery

## 2014-06-17 ENCOUNTER — Other Ambulatory Visit: Payer: Self-pay | Admitting: Cardiovascular Disease

## 2014-06-21 ENCOUNTER — Ambulatory Visit (INDEPENDENT_AMBULATORY_CARE_PROVIDER_SITE_OTHER): Payer: Medicare Other | Admitting: Cardiovascular Disease

## 2014-06-21 ENCOUNTER — Encounter: Payer: Self-pay | Admitting: Cardiovascular Disease

## 2014-06-21 ENCOUNTER — Encounter (INDEPENDENT_AMBULATORY_CARE_PROVIDER_SITE_OTHER): Payer: Self-pay

## 2014-06-21 VITALS — BP 150/80 | HR 69 | Ht 73.0 in | Wt 274.2 lb

## 2014-06-21 DIAGNOSIS — I2510 Atherosclerotic heart disease of native coronary artery without angina pectoris: Secondary | ICD-10-CM

## 2014-06-21 DIAGNOSIS — E785 Hyperlipidemia, unspecified: Secondary | ICD-10-CM

## 2014-06-21 DIAGNOSIS — E1159 Type 2 diabetes mellitus with other circulatory complications: Secondary | ICD-10-CM

## 2014-06-21 DIAGNOSIS — G8929 Other chronic pain: Secondary | ICD-10-CM

## 2014-06-21 DIAGNOSIS — M549 Dorsalgia, unspecified: Secondary | ICD-10-CM

## 2014-06-21 DIAGNOSIS — I4891 Unspecified atrial fibrillation: Secondary | ICD-10-CM

## 2014-06-21 MED ORDER — CLOPIDOGREL BISULFATE 75 MG PO TABS: 75.0000 mg | ORAL_TABLET | Freq: Every day | ORAL | Status: DC

## 2014-06-21 NOTE — Assessment & Plan Note (Signed)
Currently with no symptoms of angina. No further workup at this time. Continue current medication regimen. 

## 2014-06-21 NOTE — Progress Notes (Signed)
Patient ID: Cody Price, male    DOB: 07-28-1941, 73 y.o.   MRN: [id number]  HPI Comments: Cody Price is a 73 year old gentleman with a history of coronary artery disease, PTCA in 1991 of his RCA that later occluded 100%, with chest pain in September 2010 and stent placed to his LAD, also a history of atrial fibrillation following recent back surgery in the spontaneously converted with improved pain control presents to establish care. he does have diabetes and complications including lower extremity neuropathy. s/p back surgery for lumbar spinal stenosis and DJD,    He is unable to tolerate aspirin due to her history of GI problems 20 years ago, erosion He stopped the metoprolol on his own as he reported having fatigue.   In followup today, he reports that he is scheduled to have back surgery in November 2015 in [place]. Continues to have severe back pain. He is active, Limited in his activities by his back pain but denies any significant shortness of breath or chest pain with exertion. He feels that his heart is doing fine. No anginal symptoms. Hemoglobin A1c has improved from 6.1 down to 5.8 Total cholesterol of 80, LDL 32 in August 2015 Minimal lower extremity edema He denies any significant nausea, chest tightness or heaviness. This was his previous anginal equivalent.  Cardiac catheterization September 2000 and details a 75% proximal LAD lesion, 100% RCA lesion, 30% left circumflex lesion a xience 2.75 x 15 mm DES stent was placed.   EKG shows normal sinus rhythm with a rate of 69 beats per minute, no significant ST or T wave changes      Outpatient Encounter Prescriptions as of 06/21/2014  Medication Sig  . Cholecalciferol (VITAMIN D) 2000 UNITS CAPS Take by mouth daily.    . clopidogrel (PLAVIX) 75 MG tablet TAKE ONE TABLET BY MOUTH ONCE DAILY  . Coenzyme Q10 (CO Q-10 PO) Take by mouth.    . Cyanocobalamin (VITAMIN B-12 IJ) Inject as directed every 30 (thirty) days.    .  diazepam (VALIUM) 5 MG tablet Take 5 mg by mouth as needed.    . diltiazem (CARDIZEM CD) 180 MG 24 hr capsule Take 180 mg by mouth daily.  Marland Kitchen glimepiride (AMARYL) 4 MG tablet Take 4 mg by mouth daily.    . hydrochlorothiazide 25 MG tablet Take 25 mg by mouth daily.    Marland Kitchen lisinopril (PRINIVIL,ZESTRIL) 10 MG tablet Take 1 tablet (10 mg total) by mouth daily.  . Omega-3 Fatty Acids (TH FISH OIL EXTRA STRENGTH) 1200 MG CAPS Take 2 tablets by mouth daily.    Marland Kitchen omeprazole (PRILOSEC) 20 MG capsule Take 20 mg by mouth 2 (two) times daily.   Marland Kitchen oxyCODONE-acetaminophen (PERCOCET) 5-325 MG per tablet Take 1 tablet by mouth every 4 (four) hours as needed.    . sildenafil (VIAGRA) 100 MG tablet Take 100 mg by mouth daily as needed.    . simvastatin (ZOCOR) 40 MG tablet Take 40 mg by mouth daily.      Review of Systems  Constitutional: Negative.   HENT: Negative.   Eyes: Negative.   Respiratory: Negative.   Cardiovascular: Negative.   Gastrointestinal: Negative.   Endocrine: Negative.   Musculoskeletal: Positive for arthralgias, back pain, neck pain and neck stiffness.       Pain in his hands  Skin: Negative.   Allergic/Immunologic: Negative.   Neurological: Negative.   Hematological: Negative.   Psychiatric/Behavioral: Negative.   All other systems reviewed and are negative.  BP 150/80  Pulse 69  Ht 6\' 1"  (1.854 m)  Wt 274 lb 4 oz (124.399 kg)  BMI 36.19 kg/m2  Physical Exam  Nursing note and vitals reviewed. Constitutional: He is oriented to person, place, and time. He appears well-developed and well-nourished.  HENT:  Head: Normocephalic.  Nose: Nose normal.  Mouth/Throat: Oropharynx is clear and moist.  Eyes: Conjunctivae are normal. Pupils are equal, round, and reactive to light.  Neck: Normal range of motion. Neck supple. No JVD present.  Cardiovascular: Normal rate, regular rhythm, S1 normal, S2 normal, normal heart sounds and intact distal pulses.  Exam reveals no gallop and no  friction rub.   No murmur heard. Pulmonary/Chest: Effort normal and breath sounds normal. No respiratory distress. He has no wheezes. He has no rales. He exhibits no tenderness.  Abdominal: Soft. Bowel sounds are normal. He exhibits no distension. There is no tenderness.  Musculoskeletal: Normal range of motion. He exhibits no edema and no tenderness.  Lymphadenopathy:    He has no cervical adenopathy.  Neurological: He is alert and oriented to person, place, and time. Coordination normal.  Skin: Skin is warm and dry. No rash noted. No erythema.  Psychiatric: He has a normal mood and affect. His behavior is normal. Judgment and thought content normal.      Assessment and Plan

## 2014-06-21 NOTE — Patient Instructions (Addendum)
Your next appointment will be scheduled in our new office located at :  [facility]  [address], [place], [place] [address]   You are doing well. No medication changes were made.  Ok to stop plavic 10 days before the surgery Consider taking aspirin 81 mg during those 10 days   Please call us if you have new issues that need to be addressed before your next appt.  Your physician wants you to follow-up in: 6 months.  You will receive a reminder letter in the mail two months in advance. If you don't receive a letter, please call our office to schedule the follow-up appointment.

## 2014-06-21 NOTE — Assessment & Plan Note (Signed)
He is scheduled for surgery in November 2015. He would be susceptible risk. No further cardiac testing needed. Would recommend minimizing IV fluids to avoid risk of arrhythmia in the post surgical period

## 2014-06-21 NOTE — Assessment & Plan Note (Signed)
Maintaining NSR. No changes to his medications

## 2014-06-21 NOTE — Assessment & Plan Note (Signed)
Cholesterol is at goal on the current lipid regimen. No changes to the medications were made. we did offer to cut his statin in half as his cholesterol is very low. He preferred to stay on his current dose

## 2014-06-21 NOTE — Assessment & Plan Note (Signed)
well controlled. We have encouraged continued exercise, careful diet management in an effort to lose weight.

## 2014-06-26 ENCOUNTER — Ambulatory Visit: Payer: Self-pay | Admitting: Urology

## 2014-07-03 ENCOUNTER — Encounter (HOSPITAL_COMMUNITY): Payer: Self-pay | Admitting: Pharmacy Technician

## 2014-07-08 ENCOUNTER — Encounter (HOSPITAL_COMMUNITY): Payer: Self-pay

## 2014-07-08 ENCOUNTER — Encounter (HOSPITAL_COMMUNITY)
Admission: RE | Admit: 2014-07-08 | Discharge: 2014-07-08 | Disposition: A | Discharge status: Home / Self Care | Payer: Medicare Other | Source: Ambulatory Visit | Attending: Neurosurgery | Admitting: Neurosurgery

## 2014-07-08 DIAGNOSIS — Z01812 Encounter for preprocedural laboratory examination: Secondary | ICD-10-CM | POA: Diagnosis not present

## 2014-07-08 DIAGNOSIS — M549 Dorsalgia, unspecified: Secondary | ICD-10-CM | POA: Insufficient documentation

## 2014-07-08 HISTORY — DX: Pneumonia, unspecified organism: J18.9

## 2014-07-08 HISTORY — DX: Essential (primary) hypertension: I10

## 2014-07-08 HISTORY — DX: Acute myocardial infarction, unspecified: I21.9

## 2014-07-08 HISTORY — DX: Gastro-esophageal reflux disease without esophagitis: K21.9

## 2014-07-08 HISTORY — DX: Personal history of other diseases of the digestive system: Z87.19

## 2014-07-08 HISTORY — DX: Unspecified osteoarthritis, unspecified site: M19.90

## 2014-07-08 HISTORY — DX: Personal history of other medical treatment: Z92.89

## 2014-07-08 LAB — BASIC METABOLIC PANEL
Anion gap: 13 (ref 5–15)
BUN: 14 mg/dL (ref 6–23)
CALCIUM: 10.1 mg/dL (ref 8.4–10.5)
CO2: 26 mEq/L (ref 19–32)
Chloride: 103 mEq/L (ref 96–112)
Creatinine, Ser: 0.78 mg/dL (ref 0.50–1.35)
GFR calc non Af Amer: 88 mL/min — ABNORMAL LOW (ref >90)
Glucose, Bld: 181 mg/dL — ABNORMAL HIGH (ref 70–99)
POTASSIUM: 4 meq/L (ref 3.7–5.3)
SODIUM: 142 meq/L (ref 137–147)

## 2014-07-08 LAB — ABO/RH: ABO/RH(D): A POS

## 2014-07-08 LAB — CBC
HCT: 38.4 % — ABNORMAL LOW (ref 39.0–52.0)
Hemoglobin: 13.6 g/dL (ref 13.0–17.0)
MCH: 30.6 pg (ref 26.0–34.0)
MCHC: 35.4 g/dL (ref 30.0–36.0)
MCV: 86.5 fL (ref 78.0–100.0)
Platelets: 62 10*3/uL — ABNORMAL LOW (ref 150–400)
RBC: 4.44 MIL/uL (ref 4.22–5.81)
RDW: 14.4 % (ref 11.5–15.5)
WBC: 6 10*3/uL (ref 4.0–10.5)

## 2014-07-08 LAB — TYPE AND SCREEN
ABO/RH(D): A POS
Antibody Screen: NEGATIVE

## 2014-07-08 LAB — SURGICAL PCR SCREEN
MRSA, PCR: NEGATIVE
Staphylococcus aureus: POSITIVE — AB

## 2014-07-08 NOTE — Progress Notes (Signed)
Pt aware of need to hold plavix, substitutiion  Is recommended /w aspirin- 81mg ., per Dr. Donivan Scull note.  Reinforced same with pt.  Pt. Reports aspirin "burns my stomach". Suggested to pt. That he take aspirin EC or /&  /w food.

## 2014-07-08 NOTE — Pre-Procedure Instructions (Signed)
Cody Price  07/08/2014   Your procedure is scheduled on:  07/17/2014  Report to [facility] Admitting at 7:30 AM.  Call this number if you have problems the morning of surgery: [phone]   Remember:   Do not eat food or drink liquids after midnight. On TUESDAY  Take these medicines the morning of surgery with A SIP OF WATER: Diltiazem, Omeprazole, pain medicine    Do not wear jewelry  Do not wear lotions, powders, or perfumes. You may wear deodorant.   Men may shave face and neck.  Do not bring valuables to the hospital.  [facility] is not responsible                  for any belongings or valuables.               Contacts, dentures or bridgework may not be worn into surgery.  Leave suitcase in the car. After surgery it may be brought to your room.  For patients admitted to the hospital, discharge time is determined by your                treatment team.               Patients discharged the day of surgery will not be allowed to drive  home.  Name and phone number of your driver: /w spouse   Special Instructions: Special Instructions: Cody Price - Preparing for Surgery  Before surgery, you can play an important role.  Because skin is not sterile, your skin needs to be as free of germs as possible.  You can reduce the number of germs on you skin by washing with CHG (chlorahexidine gluconate) soap before surgery.  CHG is an antiseptic cleaner which kills germs and bonds with the skin to continue killing germs even after washing.  Please DO NOT use if you have an allergy to CHG or antibacterial soaps.  If your skin becomes reddened/irritated stop using the CHG and inform your nurse when you arrive at Short Stay.  Do not shave (including legs and underarms) for at least 48 hours prior to the first CHG shower.  You may shave your face.  Please follow these instructions carefully:   1.  Shower with CHG Soap the night before surgery and the  morning of Surgery.  2.  If  you choose to wash your hair, wash your hair first as usual with your  normal shampoo.  3.  After you shampoo, rinse your hair and body thoroughly to remove the  Shampoo.  4.  Use CHG as you would any other liquid soap.  You can apply chg directly to the skin and wash gently with scrungie or a clean washcloth.  5.  Apply the CHG Soap to your body ONLY FROM THE NECK DOWN.    Do not use on open wounds or open sores.  Avoid contact with your eyes, ears, mouth and genitals (private parts).  Wash genitals (private parts)   with your normal soap.  6.  Wash thoroughly, paying special attention to the area where your surgery will be performed.  7.  Thoroughly rinse your body with warm water from the neck down.  8.  DO NOT shower/wash with your normal soap after using and rinsing off   the CHG Soap.  9.  Pat yourself dry with a clean towel.            10.  Wear clean pajamas.  11.  Place clean sheets on your bed the night of your first shower and do not sleep with pets.  Day of Surgery  Do not apply any lotions/deodorants the morning of surgery.  Please wear clean clothes to the hospital/surgery center.    Please read over the following fact sheets that you were given: Pain Booklet, Coughing and Deep Breathing, Blood Transfusion Information, MRSA Information and Surgical Site Infection Prevention

## 2014-07-08 NOTE — Progress Notes (Signed)
   07/08/14 1035  OBSTRUCTIVE SLEEP APNEA  Have you ever been diagnosed with sleep apnea through a sleep study? No  Do you snore loudly (loud enough to be heard through closed doors)?  0  Do you often feel tired, fatigued, or sleepy during the daytime? 0  Has anyone observed you stop breathing during your sleep? 0  Do you have, or are you being treated for high blood pressure? 1  BMI more than 35 kg/m2? 1  Age over 72 years old? 1  Neck circumference greater than 40 cm/16 inches? 1  Gender: 1  Obstructive Sleep Apnea Score 5  Score 4 or greater  Results sent to PCP

## 2014-07-09 ENCOUNTER — Encounter (HOSPITAL_COMMUNITY): Payer: Self-pay | Admitting: Vascular Surgery

## 2014-07-09 NOTE — Progress Notes (Signed)
Anesthesia Chart Review:  Patient is a 73 year old male scheduled for L3-4 PLIF with revisio of existing hardware on 07/17/14 by Dr. Christella Noa.  History includes former smoker, CAD/MI s/p PTCA '91 RCA that later occluded and s/p LAD stent 05/2009, post-operative afib ~ 2011, HLD, pernicious anemia, peripheral neuropathy, PE, OSA, GERD, hiatal hernia. He is unable to tolerate ASA due to GI issues/erosion and stopped metoprolol due to fatigue. BMI is consistent with obesity. PCP is Dr. Lisette Grinder (Care Everywhere).     Cardiologist is Dr. Rockey Situ, who cleared patient with CV acceptable risk on 06/21/14.  No further cardiac testing needed but recommended minimizing IV fluids to avoid risk of arrhythmia in the post surgical period.  According to Dr. Donivan Scull notes, cardiac catheterization in 05/2009 [facility]) showed a 75% proximal LAD lesion, 100% RCA lesion, 30% left circumflex lesion. A Xience 2.75 x 15 mm DES stent was placed in the mid LAD with 0% residual stenosis on 05/15/2009.  Preoperative labs noted. PLT count low at 62K.  No reported history of thrombocytopenia or documented in most recent PCP note in Care Everywhere.  A1C 6.1 01/29/14.  No recent CBC noted in Care Everywhere. There are comparison labs from 02/14/12 under Media tab that show a PLT count of 107K and 04/25/14 labs from [facility] show a PLT count of 70K.  Only rare ETOH use reported.  PLT count called to Manuela Schwartz at Dr. Lacy Duverney office.  She will review with him for additional recommendations. I recommended input from his PCP since I don't see any mention of thrombocytopenia in Dr. Thomes Dinning notes--although it doesn't appear to be a completely new issue, but worse since 2013.  I'll await update from Dr. Lacy Duverney office.  George [facility] Short Stay Center/Anesthesiology Phone [phone] 07/09/2014 5:32 PM

## 2014-07-16 ENCOUNTER — Ambulatory Visit: Payer: Self-pay | Admitting: Oncology

## 2014-07-17 ENCOUNTER — Telehealth: Payer: Self-pay | Admitting: Cardiovascular Disease

## 2014-07-17 ENCOUNTER — Encounter (HOSPITAL_COMMUNITY): Admission: RE | Payer: Self-pay | Source: Ambulatory Visit

## 2014-07-17 ENCOUNTER — Inpatient Hospital Stay (HOSPITAL_COMMUNITY): Admission: RE | Admit: 2014-07-17 | Payer: Medicare Other | Source: Ambulatory Visit | Admitting: Neurosurgery

## 2014-07-17 SURGERY — POSTERIOR LUMBAR FUSION 1 WITH HARDWARE REMOVAL
Anesthesia: General

## 2014-07-17 NOTE — Telephone Encounter (Signed)
I was just trying to help him out. The last CBC I have access to was 07/08/2014 which shows a platelet count of 62,000. This was 9 days ago. It would be nice to have a current CBC to make an informed decision regarding the patient's surgery and medications.   However, it does not look like that will be the case. I advise him to continue to stay off his Plavix until his Platelet count returns to normal. He will need to follow up with his PCP and Hem/Onc to have this lab done and determine why his platelet count dipped low. He can then get in touch with his surgeon regarding rescheduling the surgery.

## 2014-07-17 NOTE — Telephone Encounter (Signed)
Pt was suppose to have a produce today but blood count was low,  And so they wont' do procedure until it  That goes up, so pt wanted to know if he can go back on blood thinner. Pt was suppose to have back surgery.  Please call pt, he is worried.

## 2014-07-17 NOTE — Telephone Encounter (Signed)
Spoke w/ pt.  Advised him of Ryan's recommendation.  Pt cut me off after I asked him to come in for labs.  He states that he will not come over for any more labs, as he has had them drawn in [place] and [place], and each visit costs him $40.  Pt became upset, asking why Dr. Rockey Situ did not call him personally to speak w/ him.  Advised him that Dr. Rockey Situ is in clinic seeing pts at the moment, that I had asked Christell Faith, PA his opinion to avoid pt having to wait until after clinic.  He states that if anyone wants to know about his labs, that we need to call Dr. Thomes Dinning office to discuss.  Pt repeatedly states that he we need to call Dr. Thomes Dinning office and hung up on me.

## 2014-07-17 NOTE — Telephone Encounter (Signed)
-  Platelet count was 62,000 on 07/08/2014  -Now 07/17/2014 if there has not been a more recent CBC drawn (I do not seen one) please see if he can come in today for a stat CBC so we can make a clinical decision and forward to his surgeon --If platelet count has improved surgeon can readdress  --If platelet count remains suppressed will hold Plavix   -Prior platelet count of 107,000 in 2014

## 2014-07-17 NOTE — Telephone Encounter (Signed)
Spoke w/ pt.  He reports that he was sched for back surgery, but his platelets were low, so his procedure was cancelled.  He reports that he has not been given any further instructions.  He is unsure of when this will be rescheduled.  He has been off of his Plavix for 10 days in preparation.  He would like to know if he should restart his Plavix or wait to see how soon his platelets will be back up to have this rescheduled.  Please advise.  Thank you.

## 2014-07-18 ENCOUNTER — Other Ambulatory Visit: Payer: Self-pay | Admitting: Cardiovascular Disease

## 2014-07-19 ENCOUNTER — Other Ambulatory Visit: Payer: Self-pay | Admitting: Cardiovascular Disease

## 2014-07-19 NOTE — Telephone Encounter (Signed)
Would discuss the low platelets with Dr. Gilford Rile.  Would repeat the blood work before assuming it is low. Certainly could be a lab error. We can certainly check his lab work without a fee through our clinic  If the repeat blood work shows low platelets, may need referral to hematology Dr before proceeding with surgery  In terms of the aspirin, Plavix question Would probably stay on at least one low-dose baby aspirin Would probably hold Plavix for now as this tends to stay in his system longer If he does have a nosebleed or other bleed from low platelets, would be less of an issue with low-dose aspirin

## 2014-07-22 NOTE — Telephone Encounter (Signed)
Spoke w/ pt.  Advised him of Dr. Donivan Scull recommendation. He reports that he is sched to have repeat blood work on Wednesday.  When is suggested possible referral to hematology, he states that "I'm taking care of that on Wednesday".  Pt is agreeable to staying on low dose aspirin and reports that he has not yet resumed his Plavix.  Asked pt to keep [place] posted on his condition and call w/ any questions or concerns.

## 2014-07-24 ENCOUNTER — Ambulatory Visit: Payer: Self-pay | Admitting: Oncology

## 2014-07-24 LAB — IRON AND TIBC
Iron Bind.Cap.(Total): 331 ug/dL (ref 250–450)
Iron Saturation: 20 %
Iron: 66 ug/dL (ref 65–175)
Unbound Iron-Bind.Cap.: 265 ug/dL

## 2014-07-24 LAB — CBC CANCER CENTER
BASOS ABS: 0 x10 3/mm (ref 0.0–0.1)
Basophil %: 0.6 %
Eosinophil #: 0.1 x10 3/mm (ref 0.0–0.7)
Eosinophil %: 0.7 %
HCT: 39.6 % — ABNORMAL LOW (ref 40.0–52.0)
HGB: 13.3 g/dL (ref 13.0–18.0)
Lymphocyte #: 1.5 x10 3/mm (ref 1.0–3.6)
Lymphocyte %: 20.5 %
MCH: 30 pg (ref 26.0–34.0)
MCHC: 33.6 g/dL (ref 32.0–36.0)
MCV: 89 fL (ref 80–100)
MONOS PCT: 34.4 %
Monocyte #: 2.4 x10 3/mm — ABNORMAL HIGH (ref 0.2–1.0)
Neutrophil #: 3.1 x10 3/mm (ref 1.4–6.5)
Neutrophil %: 43.8 %
Platelet: 69 x10 3/mm — ABNORMAL LOW (ref 150–440)
RBC: 4.44 10*6/uL (ref 4.40–5.90)
RDW: 15 % — ABNORMAL HIGH (ref 11.5–14.5)
WBC: 7.1 x10 3/mm (ref 3.8–10.6)

## 2014-07-24 LAB — APTT: Activated PTT: 33.9 secs (ref 23.6–35.9)

## 2014-07-24 LAB — PROTIME-INR
INR: 1
Prothrombin Time: 13.5 secs (ref 11.5–14.7)

## 2014-07-24 LAB — LACTATE DEHYDROGENASE: LDH: 151 U/L (ref 85–241)

## 2014-07-24 LAB — FOLATE: Folic Acid: 11.5 ng/mL (ref 3.1–100.0)

## 2014-07-24 LAB — FERRITIN: Ferritin (ARMC): 23 ng/mL (ref 8–388)

## 2014-07-26 LAB — PROT IMMUNOELECTROPHORES(ARMC)

## 2014-08-06 ENCOUNTER — Ambulatory Visit: Payer: Self-pay | Admitting: Oncology

## 2014-08-07 LAB — PROTIME-INR
INR: 1
Prothrombin Time: 13.5 secs (ref 11.5–14.7)

## 2014-08-07 LAB — CBC CANCER CENTER
Basophil #: 0.1 x10 3/mm (ref 0.0–0.1)
Basophil %: 0.9 %
EOS PCT: 0.7 %
Eosinophil #: 0 x10 3/mm (ref 0.0–0.7)
HCT: 42.1 % (ref 40.0–52.0)
HGB: 14.4 g/dL (ref 13.0–18.0)
LYMPHS ABS: 1.3 x10 3/mm (ref 1.0–3.6)
Lymphocyte %: 22.4 %
MCH: 30.4 pg (ref 26.0–34.0)
MCHC: 34.1 g/dL (ref 32.0–36.0)
MCV: 89 fL (ref 80–100)
MONO ABS: 1.7 x10 3/mm — AB (ref 0.2–1.0)
Monocyte %: 28.7 %
NEUTROS PCT: 47.3 %
Neutrophil #: 2.7 x10 3/mm (ref 1.4–6.5)
PLATELETS: 59 x10 3/mm — AB (ref 150–440)
RBC: 4.74 10*6/uL (ref 4.40–5.90)
RDW: 14.5 % (ref 11.5–14.5)
WBC: 5.8 x10 3/mm (ref 3.8–10.6)

## 2014-08-07 LAB — APTT: Activated PTT: 33.2 secs (ref 23.6–35.9)

## 2014-08-14 LAB — CBC CANCER CENTER
BASOS PCT: 0.5 %
Basophil #: 0.1 x10 3/mm (ref 0.0–0.1)
Eosinophil #: 0 x10 3/mm (ref 0.0–0.7)
Eosinophil %: 0.1 %
HCT: 42.4 % (ref 40.0–52.0)
HGB: 14.4 g/dL (ref 13.0–18.0)
LYMPHS ABS: 1.9 x10 3/mm (ref 1.0–3.6)
LYMPHS PCT: 17.3 %
MCH: 30.1 pg (ref 26.0–34.0)
MCHC: 33.9 g/dL (ref 32.0–36.0)
MCV: 89 fL (ref 80–100)
MONO ABS: 2.8 x10 3/mm — AB (ref 0.2–1.0)
Monocyte %: 25 %
NEUTROS ABS: 6.3 x10 3/mm (ref 1.4–6.5)
NEUTROS PCT: 57.1 %
PLATELETS: 79 x10 3/mm — AB (ref 150–440)
RBC: 4.78 10*6/uL (ref 4.40–5.90)
RDW: 14.6 % — AB (ref 11.5–14.5)
WBC: 11.1 x10 3/mm — ABNORMAL HIGH (ref 3.8–10.6)

## 2014-08-17 HISTORY — PX: CARDIAC CATHETERIZATION: SHX172

## 2014-08-21 LAB — CBC CANCER CENTER
Basophil: 1 %
Comment - H1-Com1: NORMAL
Comment - H1-Com2: NORMAL
Eosinophil: 1 %
HCT: 37.2 % — ABNORMAL LOW (ref 40.0–52.0)
HGB: 12.8 g/dL — ABNORMAL LOW (ref 13.0–18.0)
LYMPHS PCT: 30 %
MCH: 30.4 pg (ref 26.0–34.0)
MCHC: 34.4 g/dL (ref 32.0–36.0)
MCV: 88 fL (ref 80–100)
MONOS PCT: 25 %
Platelet: 93 x10 3/mm — ABNORMAL LOW (ref 150–440)
RBC: 4.22 10*6/uL — ABNORMAL LOW (ref 4.40–5.90)
RDW: 14.4 % (ref 11.5–14.5)
Segmented Neutrophils: 38 %
Variant Lymphocyte: 5 %
WBC: 7.8 x10 3/mm (ref 3.8–10.6)

## 2014-08-23 ENCOUNTER — Ambulatory Visit: Payer: Self-pay | Admitting: Cardiovascular Disease

## 2014-08-23 ENCOUNTER — Ambulatory Visit (INDEPENDENT_AMBULATORY_CARE_PROVIDER_SITE_OTHER): Payer: Medicare Other

## 2014-08-23 ENCOUNTER — Telehealth: Payer: Self-pay | Admitting: Cardiovascular Disease

## 2014-08-23 DIAGNOSIS — R079 Chest pain, unspecified: Secondary | ICD-10-CM

## 2014-08-23 LAB — TROPONIN I: Troponin-I: <0.02 ng/mL

## 2014-08-23 LAB — CK: CK, Total: 72 U/L (ref 39–308)

## 2014-08-23 NOTE — Telephone Encounter (Signed)
Spoke w/ pt's wife.  She reports that pt has been having increasing chest pain on and off for the past week, worse over the past 2 days.  Reports pain in center of chest that radiates to both arms, pt will become SOB, have n/v and sweat profusely.  Reports that sx occur during exertion or rest and subside on their own.  She states that pt has stents, has been off of blood thinners for some time in an effort "to get his blood back up before he has back surgery". Advised her that pt needs immediate attention but we have no openings in clinic. She states that she would feel more comfortable taking pt to ED, she just wanted to see if pt could be seen in the office instead.   Advised Dr. Rockey Situ of pt's sx. Advised pt's wife that Dr. Rockey Situ recommend pt come to the office for EKG and stat troponin & cardiac enzymes. She is agreeable to this and states that she will have pt here within the hour.

## 2014-08-23 NOTE — Telephone Encounter (Signed)
Please see nurse visit for today.

## 2014-08-23 NOTE — Telephone Encounter (Signed)
Pt wife calling stating that pt has had chest pains and arm pains over a week. Wants to come in

## 2014-08-23 NOTE — Progress Notes (Signed)
1.) Reason for visit: Chest pain  2.) Name of MD requesting visit: Dr. Rockey Situ  3.) H&P:  Pt's wife called this am stating that pt has been having increasing chest pain on and off for the past week, worse over the past 2 days. Pt reports pain in the center of his chest that radiates to both arms, pt will become SOB, have n/v and sweat profusely.  She reports that sx occur during exertion or rest and subside on their own.  She states that pt has stents, has been off of blood thinners for some time in an effort "to get his blood back up before he has back surgery."  4.) ROS related to problem: Pt presents to the office, wincing and stating that he is in quite a bit of pain.  He is unable to tell me if chest pain or neck/back pain is worse.  EKG performed and pt given orders for STAT labs to be taken to the [facility] now.  Advised pt that I asked that results be paged to Dr. Rockey Situ and for them to wait in [place] for our call.   5.) Assessment and plan per MD:   Dr. Rockey Situ reviewed labs & EKG.  Advised to that these were WNL, he can go home for now, but we can set up a cath.  Advised pt that if his sx worsen or continue, to call 911 or proceed to ED.  He verbalizes understanding and states that he does not feel that a cath needs to be done urgently and will wait to see how he feels over the weekend.

## 2014-08-23 NOTE — Telephone Encounter (Signed)
Called pt's home, as pt has not arrived to office yet.  No answer.

## 2014-08-23 NOTE — Telephone Encounter (Signed)
EKG unchanged, cardiac enzymes negative If he continues to have symptoms, would schedule a cardiac catheterization for next week at [facility]

## 2014-08-23 NOTE — Patient Instructions (Signed)
Please take orders to the [facility] for STAT labs:  Troponin & cardiac enzymes  Please remain at the [facility] until we call you with results

## 2014-08-25 ENCOUNTER — Inpatient Hospital Stay: Payer: Self-pay | Admitting: Internal Medicine

## 2014-08-25 LAB — BASIC METABOLIC PANEL
Anion Gap: 7 (ref 7–16)
BUN: 18 mg/dL (ref 7–18)
CHLORIDE: 99 mmol/L (ref 98–107)
CREATININE: 1.38 mg/dL — AB (ref 0.60–1.30)
Calcium, Total: 9.6 mg/dL (ref 8.5–10.1)
Co2: 28 mmol/L (ref 21–32)
EGFR (African American): >60
EGFR (Non-African Amer.): 54 — ABNORMAL LOW
Glucose: 249 mg/dL — ABNORMAL HIGH (ref 65–99)
OSMOLALITY: 279 (ref 275–301)
Potassium: 4.3 mmol/L (ref 3.5–5.1)
Sodium: 134 mmol/L — ABNORMAL LOW (ref 136–145)

## 2014-08-25 LAB — CBC
HCT: 35.5 % — AB (ref 40.0–52.0)
HGB: 12.5 g/dL — ABNORMAL LOW (ref 13.0–18.0)
MCH: 31 pg (ref 26.0–34.0)
MCHC: 35.1 g/dL (ref 32.0–36.0)
MCV: 88 fL (ref 80–100)
Platelet: 72 10*3/uL — ABNORMAL LOW (ref 150–440)
RBC: 4.02 10*6/uL — ABNORMAL LOW (ref 4.40–5.90)
RDW: 15 % — AB (ref 11.5–14.5)
WBC: 8.6 10*3/uL (ref 3.8–10.6)

## 2014-08-25 LAB — TROPONIN I
Troponin-I: <0.02 ng/mL
Troponin-I: <0.02 ng/mL
Troponin-I: <0.02 ng/mL

## 2014-08-26 DIAGNOSIS — E785 Hyperlipidemia, unspecified: Secondary | ICD-10-CM

## 2014-08-26 DIAGNOSIS — R079 Chest pain, unspecified: Secondary | ICD-10-CM

## 2014-08-26 DIAGNOSIS — I10 Essential (primary) hypertension: Secondary | ICD-10-CM

## 2014-08-26 DIAGNOSIS — I25110 Atherosclerotic heart disease of native coronary artery with unstable angina pectoris: Secondary | ICD-10-CM

## 2014-08-26 LAB — BASIC METABOLIC PANEL
Anion Gap: 5 — ABNORMAL LOW (ref 7–16)
BUN: 19 mg/dL — ABNORMAL HIGH (ref 7–18)
Calcium, Total: 9.2 mg/dL (ref 8.5–10.1)
Chloride: 101 mmol/L (ref 98–107)
Co2: 29 mmol/L (ref 21–32)
Creatinine: 1.11 mg/dL (ref 0.60–1.30)
EGFR (African American): >60
EGFR (Non-African Amer.): >60
Glucose: 156 mg/dL — ABNORMAL HIGH (ref 65–99)
OSMOLALITY: 276 (ref 275–301)
POTASSIUM: 4.2 mmol/L (ref 3.5–5.1)
Sodium: 135 mmol/L — ABNORMAL LOW (ref 136–145)

## 2014-08-26 LAB — LIPID PANEL
Cholesterol: 94 mg/dL (ref 0–200)
HDL: 23 mg/dL — AB (ref 40–60)
Ldl Cholesterol, Calc: 33 mg/dL (ref 0–100)
Triglycerides: 191 mg/dL (ref 0–200)
VLDL CHOLESTEROL, CALC: 38 mg/dL (ref 5–40)

## 2014-08-26 LAB — PROTIME-INR
INR: 1.1
Prothrombin Time: 14.3 secs (ref 11.5–14.7)

## 2014-08-28 ENCOUNTER — Encounter: Payer: Self-pay | Admitting: Cardiovascular Disease

## 2014-09-06 ENCOUNTER — Ambulatory Visit: Payer: Self-pay | Admitting: Oncology

## 2014-09-16 ENCOUNTER — Ambulatory Visit (INDEPENDENT_AMBULATORY_CARE_PROVIDER_SITE_OTHER): Payer: Medicare Other | Admitting: Cardiovascular Disease

## 2014-09-16 ENCOUNTER — Encounter: Payer: Self-pay | Admitting: Cardiovascular Disease

## 2014-09-16 VITALS — BP 130/66 | HR 65 | Ht 74.0 in | Wt 267.5 lb

## 2014-09-16 DIAGNOSIS — E1159 Type 2 diabetes mellitus with other circulatory complications: Secondary | ICD-10-CM

## 2014-09-16 DIAGNOSIS — G8929 Other chronic pain: Secondary | ICD-10-CM

## 2014-09-16 DIAGNOSIS — E785 Hyperlipidemia, unspecified: Secondary | ICD-10-CM

## 2014-09-16 DIAGNOSIS — I2510 Atherosclerotic heart disease of native coronary artery without angina pectoris: Secondary | ICD-10-CM

## 2014-09-16 DIAGNOSIS — M549 Dorsalgia, unspecified: Secondary | ICD-10-CM

## 2014-09-16 DIAGNOSIS — I4891 Unspecified atrial fibrillation: Secondary | ICD-10-CM

## 2014-09-16 NOTE — Assessment & Plan Note (Signed)
We have encouraged continued exercise, careful diet management in an effort to lose weight. 

## 2014-09-16 NOTE — Assessment & Plan Note (Signed)
Maintaining normal sinus rhythm. No medication changes

## 2014-09-16 NOTE — Assessment & Plan Note (Signed)
Recent cardiac cath showing stable disease. Suggested he continue his Plavix, simvastatin

## 2014-09-16 NOTE — Progress Notes (Signed)
Patient ID: Cody Price, male    DOB: 1941-07-02, 74 y.o.   MRN: [id number]  HPI Comments: [name] is a 74 year old gentleman with a history of coronary artery disease, PTCA in 1991 of his RCA that later occluded 100%, with chest pain in September 2010 and stent placed to his LAD, also a history of atrial fibrillation following recent back surgery in the spontaneously converted with improved pain control presenting for routine follow-up of his coronary artery disease. history of diabetes and complications including lower extremity neuropathy. s/p back surgery for lumbar spinal stenosis and DJD  He is unable to tolerate aspirin due to her history of GI problems 20 years ago, erosion He previously stopped the metoprolol on his own as he reported having fatigue.  Previously seen by Dr. Grayland Ormond for low platelets, treated with prednisone  In followup today, he continues to have severe back pain. He takes pain medication He presented to the emergency room December 20 with chest pain. Cardiac enzymes were negative Recovered well from his cardiac catheterization 08/26/2014. Cardiac catheterization was performed for chest pain. This showed an occluded proximal RCA which was chronic, mild to moderate proximal left circumflex, moderate distal left circumflex disease of a small vessel, moderate proximal OM disease moderate size vessel, LAD proximal stent patent, normal ejection fraction. Medical management was recommended.  Since the cardiac catheterization, he denies any further episodes of chest pain. He does have significant fatigue. He is active, Limited in his activities by his back pain but denies any significant shortness of breath or chest pain with exertion.  Minimal lower extremity edema He denies any significant nausea, chest tightness or heaviness. This was his previous anginal equivalent.  EKG on today's visit shows normal sinus rhythm with rate 65 bpm, no significant ST or T-wave  changes Recent lab work 08/26/2014 showing total cholesterol 94, LDL 33, HDL 23  Cardiac catheterization September 2000 and details a 75% proximal LAD lesion, 100% RCA lesion, 30% left circumflex lesion a xience 2.75 x 15 mm DES stent was placed.     Allergies  Allergen Reactions  . Nitroglycerin Other (See Comments)    Makes Blood pressure go to low.   . Procaine Hcl Other (See Comments)    Passes out    Outpatient Encounter Prescriptions as of 09/16/2014  Medication Sig  . Cholecalciferol (VITAMIN D) 2000 UNITS CAPS Take by mouth daily.    . clopidogrel (PLAVIX) 75 MG tablet Take 1 tablet (75 mg total) by mouth daily.  . Coenzyme Q10 (CO Q-10 PO) Take 1 capsule by mouth daily.   . Cyanocobalamin (VITAMIN B-12 IJ) Inject 1 vial into the muscle every 30 (thirty) days.   . diazepam (VALIUM) 5 MG tablet Take 5 mg by mouth every 12 (twelve) hours as needed for muscle spasms.   Marland Kitchen diltiazem (CARDIZEM CD) 180 MG 24 hr capsule Take 180 mg by mouth daily before breakfast.   . glimepiride (AMARYL) 4 MG tablet Take 4 mg by mouth daily. Sometimes pt. Takes this med. Twice daily  . hydrochlorothiazide 25 MG tablet Take 25 mg by mouth daily.    Marland Kitchen lisinopril (PRINIVIL,ZESTRIL) 10 MG tablet TAKE ONE TABLET BY MOUTH ONCE DAILY  . Omega-3 Fatty Acids (TH FISH OIL EXTRA STRENGTH) 1200 MG CAPS Take 1 tablet by mouth daily.   Marland Kitchen omeprazole (PRILOSEC) 20 MG capsule Take 20 mg by mouth 2 (two) times daily.   Marland Kitchen oxyCODONE-acetaminophen (PERCOCET) 5-325 MG per tablet Take 2 tablets by mouth  every 4 (four) hours as needed for moderate pain.   . sildenafil (VIAGRA) 100 MG tablet Take 100 mg by mouth daily as needed for erectile dysfunction.   . simvastatin (ZOCOR) 40 MG tablet Take 40 mg by mouth daily before breakfast.     Past Medical History  Diagnosis Date  . CAD (coronary artery disease) 2010    stent  . Ischemic heart disease 1991    with angioplasty  . HLD (hyperlipidemia)   . Spinal stenosis   .  Pernicious anemia   . Peripheral neuropathy   . Elevated PSA   . Pulmonary embolism     previous  . Morbid obesity   . Kidney stone   . Diabetes mellitus   . Hypertension   . Myocardial infarction   . Sleep apnea     pt. denies sleep apnea, pt. states he has had 2 studies - last one 2011  . Pneumonia 1995    /w PE,   . H/O CT scan     recent renal CT due to hematuria, cystoscopy - normal    . GERD (gastroesophageal reflux disease)   . History of hiatal hernia   . Arthritis     degenerative back     Past Surgical History  Procedure Laterality Date  . Cad      stent 2010  . Bilateral inguinal hernia repair    . Cholecystectomy    . Appendectomy    . Laminectomy    . Back surgery      x3 back surgeries - /w fusioin, 1- [place]  . Carpal tunnel release  2014    bilateral   . Cardiac catheterization  08/17/2014    Social History  reports that he quit smoking about 28 years ago. He does not have any smokeless tobacco history on file. He reports that he drinks alcohol. He reports that he does not use illicit drugs.  Family History family history includes Heart attack in his brother; Heart failure in his mother.   Review of Systems  Constitutional: Negative.   Respiratory: Negative.   Cardiovascular: Negative.   Gastrointestinal: Negative.   Musculoskeletal: Positive for back pain, arthralgias, neck pain and neck stiffness.       Pain in his hands  Neurological: Negative.   Hematological: Negative.   Psychiatric/Behavioral: Negative.   All other systems reviewed and are negative.   BP 130/66 mmHg  Pulse 65  Ht 6\' 2"  (1.88 m)  Wt 267 lb 8 oz (121.337 kg)  BMI 34.33 kg/m2  Physical Exam  Constitutional: He is oriented to person, place, and time. He appears well-developed and well-nourished.  Obese  HENT:  Head: Normocephalic.  Nose: Nose normal.  Mouth/Throat: Oropharynx is clear and moist.  Eyes: Conjunctivae are normal. Pupils are equal,  round, and reactive to light.  Neck: Normal range of motion. Neck supple. No JVD present.  Cardiovascular: Normal rate, regular rhythm, S1 normal, S2 normal, normal heart sounds and intact distal pulses.  Exam reveals no gallop and no friction rub.   No murmur heard. Pulmonary/Chest: Effort normal and breath sounds normal. No respiratory distress. He has no wheezes. He has no rales. He exhibits no tenderness.  Abdominal: Soft. Bowel sounds are normal. He exhibits no distension. There is no tenderness.  Musculoskeletal: Normal range of motion. He exhibits no edema or tenderness.  Lymphadenopathy:    He has no cervical adenopathy.  Neurological: He is alert and oriented to person, place, and  time. Coordination normal.  Skin: Skin is warm and dry. No rash noted. No erythema.  Psychiatric: He has a normal mood and affect. His behavior is normal. Judgment and thought content normal.      Assessment and Plan   Nursing note and vitals reviewed.

## 2014-09-16 NOTE — Assessment & Plan Note (Signed)
Cholesterol is at goal on the current lipid regimen. No changes to the medications were made.  

## 2014-09-16 NOTE — Patient Instructions (Signed)
You are doing well. No medication changes were made.  Please call us if you have new issues that need to be addressed before your next appt.  Your physician wants you to follow-up in: 6 months.  You will receive a reminder letter in the mail two months in advance. If you don't receive a letter, please call our office to schedule the follow-up appointment.   

## 2014-09-16 NOTE — Assessment & Plan Note (Signed)
He has indicated that he does not want surgery for his back. Currently on pain medication

## 2014-12-27 NOTE — Discharge Summary (Signed)
PATIENT NAME:  Cody Price, Cody Price MR#:  [id number] DATE OF BIRTH:  May 15, 1941  DATE OF ADMISSION:  04/06/2013 DATE OF DISCHARGE:  04/08/2013  PRIMARY CARE PHYSICIAN: Dr. Lisette Grinder.   FINAL DIAGNOSES: 1.  Gastroenteritis, possibly viral, with dehydration.  2.  Degenerative disk disease. With chronic low back pain.  3.  Diabetes mellitus, with diabetic peripheral neuropathy.  4.  Coronary artery disease.  5.  Hypertension.  6.  Hyperlipidemia.  7.  Gastroesophageal reflux disease.  8.  Testosterone deficiency.   HISTORY AND PHYSICAL: Please see dictated admission history and physical.   [place]: The patient was admitted after having nausea, vomiting, abdominal pain, and diarrhea. He was placed on IV fluids for replacement of his dehydration. Stool studies were sent, which were negative for C. difficile, Salmonella, shigella. His CBC revealed evidence of monocytosis, which suggests possible viral gastroenteritis. His diet was slowly advanced, his abdominal pain was better. He had some nausea, but no further vomiting. Due to the location of his discomfort, mainly in the left lower quadrant, he underwent CT of the abdomen and pelvis, showed no significant abnormalities. He was placed empirically on Flagyl for possibility of underlying diverticulitis. He remained afebrile.   His blood pressure medications were slowly reintroduced except for hydrochlorothiazide. He was ambulating around in the room and felt back to baseline, so at this time he will be discharged home in stable condition with his physical activity to be up as tolerated. He should follow a 2 gram sodium, 1800 calorie ADA diet. He should check his blood sugar daily and record this. He should check his blood pressure 2 to 3 times a week. We have recommended that he keep his diet bland for the next 2 to 3 days. He will follow up with Dr. Lisette Grinder in the next 1 to 2 weeks.   DISCHARGE MEDICATIONS: 1.  Percocet 5/325 mg  1 to 2 p.o. every 4 hours p.r.n., severe pain.  2.  Valium 5 mg p.o. t.i.d. as needed for anxiety.  3.  Viagra 100 mg p.o. daily as needed.  4.  Vitamin B12 1000 mcg IM q. month.  5.  Cardizem CD 180 mg p.o. daily.  6.  Fish oil 1000 mg, 2 p.o. daily.  7.  Plavix 75 mg p.o. daily.  8.  Simvastatin 40 mg p.o. at bedtime.  9.  Vitamin D 1000 units p.o. b.i.d.  10.  Testosterone 200 mg IM every 4 weeks.  11.  Glimiperide 4 mg p.o. b.i.d.  12.  Ondansetron 4 mg p.o. q. 8 hours as needed for nausea or vomiting.  13.  Pantoprazole 40 mg p.o. daily.  14.  Flagyl 500 mg p.o. t.i.d. x 7 days.   He was given instructions to hold hydrochlorothiazide for now, to restart this if his blood pressure gets over 140/90.   He should stop omeprazole given his use of clopidogrel. This was replaced with a pantoprazole prescription, and written instructions given to the patient.     ____________________________ Adin Hector, MD [id number] D: 04/08/2013 09:53:18 ET T: 04/08/2013 11:51:16 ET JOB#: [id number]  cc: Tama High III, MD, <Dictator> John B. Sarina Ser, MD  Ramonita Lab MD ELECTRONICALLY SIGNED 04/09/2013 7:54

## 2014-12-27 NOTE — H&P (Signed)
PATIENT NAME:  Cody Price, Cody Price MR#:  [id number] DATE OF BIRTH:  14-Sep-1940  DATE OF ADMISSION:  04/06/2013  PRIMARY CARE PHYSICIAN:  Lisette Grinder, M.D.   CHIEF COMPLAINT: Nausea, vomiting and diarrhea.   HISTORY OF PRESENT ILLNESS: This is a 74 year old man who states that he ate something last Tuesday night at [employer], some ham and he did bring it home. That night he started developing nausea, vomiting, diarrhea. His son ate the leftovers and then also got sick, but his son got over it, but the patient is still sick with nausea, vomiting and diarrhea. He has lost 15 pounds, unable to keep anything down. He has severe abdominal pain, 8 out of 10 in intensity a knot-like feeling in his stomach that comes and goes. No blood in the bowel movements. No blood in the vomitus. In the ER, he was found to be hypokalemic and hospitalist services were contacted for further evaluation.   PAST MEDICAL HISTORY: Diabetes with neuropathy, coronary artery disease, hypertension, hyperlipidemia, large platelets, gastroesophageal reflux disease, anxiety and low testosterone.   PAST SURGICAL HISTORY: Low back surgery x3. Appendectomy, gallbladder and 3 hernia surgeries.   ALLERGIES: AMITRIPTYLINE, ASPIRIN, LYRICA AND METFORMIN, NEURONTIN, NITROGLYCERIN AND NOVOCAIN.   SOCIAL HISTORY: No smoking. Rare alcohol. No drug use. He is disabled. He used to be a Dealer in the past.   FAMILY HISTORY: Mother died at 1 of an MI. Father died of an MI at age 36.   REVIEW OF SYSTEMS:  CONSTITUTIONAL: Positive for fever. Positive for chills. Positive for sweats. Positive for weight loss 15 pounds over the last few days. Positive for fatigue.  EYES: He does wear glasses.  EARS, NOSE, MOUTH, AND THROAT: No hearing loss. No sore throat. No difficulty swallowing.  CARDIOVASCULAR: Positive for chest pain No palpitations.  RESPIRATORY: No shortness of breath. No cough. No sputum. No hemoptysis.  GASTROINTESTINAL: Positive for  nausea. Positive for vomiting. No hematemesis. Positive for abdominal pain. Positive for diarrhea. No bright red blood per rectum. No melena.  GENITOURINARY: Positive for burning on urination and decreased urination.  MUSCULOSKELETAL: Positive for neuropathy pain.  INTEGUMENT: No rashes or eruptions.  NEUROLOGIC: Positive for neuropathy.  PSYCHIATRIC: On medication for anxiety.  ENDOCRINE: No thyroid problems.  HEMATOLOGIC AND LYMPHATIC: History of large platelets.   PHYSICAL EXAMINATION: VITAL SIGNS: Temperature 97.6, pulse 63, respirations 20, blood pressure 134/60, pulse ox 98% on room air.  GENERAL: Obese male lying flat in bed in no respiratory distress.  EYES: Conjunctivae and lids normal. Pupils equal, round, and reactive to light. Extraocular muscles intact. No nystagmus.  EARS, NOSE, MOUTH, AND THROAT: Tympanic membranes: No erythema. Nasal mucosa: No erythema. Throat: No erythema, no exudate seen. Lips and gums: No lesions.  NECK: No JVD. No bruits. No lymphadenopathy. No thyromegaly. No thyroid nodules palpated.  RESPIRATORY:  Lungs clear to auscultation. No use of accessory muscles to breathe. No rhonchi, rales, or wheeze heard.  CARDIOVASCULAR: S1, S2 normal. No gallops, rubs or murmurs heard. Carotid upstroke 2+ bilaterally. No bruits. Dorsalis pedis pulses 2+ bilaterally, 1+ edema, bilateral lower extremity.  ABDOMEN: Soft. Positive tenderness throughout the entire abdomen. No organomegaly/splenomegaly. Normoactive bowel sounds. No masses felt.  LYMPHATIC: No lymph nodes in the neck.  MUSCULOSKELETAL: 1+ edema. No clubbing. No cyanosis.  SKIN: No ulcers or lesions seen.  NEUROLOGIC: Cranial nerves II through XII grossly intact. Deep tendon reflexes 1+ bilateral lower extremities.  PSYCHIATRIC: The patient is oriented to person, place and time.  LABORATORY AND RADIOLOGICAL DATA: Urinalysis negative. White blood cell count 10.7, hemoglobin 14.8, platelet count 84 and large,  glucose 178, BUN 14, creatinine 0.91, sodium 136, potassium 3.0, chloride 101, CO2 28, calcium 9.2, total bilirubin 1.5. Other liver function tests normal range. Lipase 145. I added on a magnesium which was 1.5. CT scan of the abdomen and pelvis showed post surgical changes about the abdomen, but no comment on those post surgical changes. Prostate enlargement with slight irregularity, mild splenomegaly. No acute abnormality. EKG no acute changes.   ASSESSMENT AND PLAN: 1.  Acute gastroenteritis with severe abdominal pain, nausea, vomiting and diarrhea and weight loss. This still could be a viral gastroenteritis but could also be bacterial or even a food poisoning. We will give supportive care with IV pain medications and oral pain medications if able to tolerate. IV nausea medications. We will give IV fluid hydration and another fluid bolus wide open and then next liter with potassium. We will try to hold off on antibiotics. We will send stool cultures.  2.  Hypokalemia and hypomagnesemia. We will give a magnesium bolus 2 grams IV x1, repeat in the a.m. and then a potassium in the IV fluids and potassium bolus and recheck labs in the a.m.  3.  Hypertension. We will hold hydrochlorothiazide at this point since that can worsen electrolyte abnormalities.  4.  Diabetes with neuropathy. We will put on sliding scale and hold on the glyburide at this point since the patient is going to be on liquid diet initially.  5.  Coronary artery disease. The patient is not on a beta blocker at this time. Continue Plavix.  6.  Hyperlipidemia. Continue simvastatin.  7.  Obesity with a BMI of 34.2. Weight loss is recommended.   TIME SPENT ON ADMISSION: 55 minutes.   CODE STATUS: THE PATIENT IS A FULL CODE.    ____________________________ Tana Conch. Leslye Peer, MD [id number] D: 04/06/2013 13:09:01 ET T: 04/06/2013 13:46:54 ET JOB#: [id number]  cc: Tana Conch. Leslye Peer, MD, <Dictator> John B. Sarina Ser, MD Marisue Brooklyn  MD ELECTRONICALLY SIGNED 04/20/2013 16:31

## 2014-12-28 NOTE — H&P (Signed)
PATIENT NAME:  Cody Price, Cody Price MR#:  [id number] DATE OF BIRTH:  Nov 12, 1940  DATE OF ADMISSION:  08/25/2014  PRIMARY CARE PHYSICIAN: Lisette Grinder, MD  CHIEF COMPLAINT: Chest pain.   History Of Present Illness: A 74 year old obese, [name] patient with history of prior CAD with stent in 2010 presents to the Emergency Room with worsening chest pain. The patient noticed that he had some left-sided chest pain going to his jaw and left arm. He initially noticed on Friday, called Dr. Donivan Scull office, when he had blood work and EKG done which was normal. He was told return to the Emergency Room if he had any further worsening. Pain seems to be slowly worsening. This is nonexertional. The patient comes to the Emergency Room, presently his pain is almost resolved, but is being admitted for cardiac catheterization which is scheduled tomorrow with Dr. Rockey Situ.   He does not have any nausea, vomiting, shortness of breath; had some sweating with the pain. No relation to position or food.   The patient stopped taking his Plavix 2 months prior for a back surgery. He does have chronic thrombocytopenia ranging between 60-80 without any bleeding, for which he follows with Dr. Grayland Ormond.   HE IS ALLERGIC TO ASPIRIN AND NITROGLYCERIN. ASPIRIN CAUSES PEPTIC ULCER DISEASE AND NITROGLYCERIN CAUSES HYPOTENSION.   PAST MEDICAL HISTORY:  1.  Diabetes mellitus with neuropathy.  2.  CAD with PCI in 2010.  3.  Hypertension.  4.  Hyperlipidemia.  5.  Thrombocytopenia.  6.  GERD. 7.  Anxiety.  8.  Low testosterone levels.  9.  Morbid obesity.  10.  Chronic pain syndrome.   PAST SURGICAL HISTORY:  1.  Low back surgery x 3.  2.  Appendectomy. 3.  Gallbladder surgery. 4.  Three hernia surgeries. 5.  Cardiac catheterization in 2010.   ALLERGIES: AMITRIPTYLINE, ASPIRIN, LYRICA, METFORMIN, NEURONTIN, NITROGLYCERIN AND NOVOCAIN.   SOCIAL HISTORY: The patient does not smoke. Rare alcohol use. No drug use. 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   FAMILY HISTORY: Mother died the age of 28 of MI; father died of MI at age 75.   REVIEW OF SYSTEMS: CONSTITUTIONAL: No fever, fatigue.  EYES: No blurred vision, pain or redness.  EARS, NOSE, AND THROAT: No tinnitus, ear pain or hearing loss.  RESPIRATORY: No cough, wheeze, hemoptysis.  CARDIOVASCULAR: Has chest pain.  GASTROINTESTINAL: No nausea, vomiting, diarrhea, abdominal pain.  GENITOURINARY: No dysuria, hematuria, frequency.  ENDOCRINOLOGY: No polyuria, nocturia, thyroid problems.  HEMATOLOGIC AND LYMPHATIC: No anemia, easy bruising, bleeding.  INTEGUMENTARY: No acne, rash, lesion.  MUSCULOSKELETAL: Has chronic back pain.  NEUROLOGIC: No focal numbness, weakness.  PSYCHIATRIC: No anxiety or depression.   HOME MEDICATIONS:  1.  Cardizem 180 mg daily.  2.  Hydrochlorothiazide 25 mg daily.  3.  Fish oil 1000 mg 2 capsules daily. 4.  Flonase 1 spray nasal daily.  5.  Glimepiride 4 mg 2 times a day. 6.  Omeprazole 20 mg 2 times a day.  7.  Oxycodone 5 mg every 6 hours as needed.  8.  Percocet 5/325 one tablet every 4 hours as needed for pain.  9.  Simvastatin 40 mg daily.  10.  Testosterone injection monthly.  11.  Valium 5 mg oral 3 times a day as needed.  12.  Viagra 100 mg oral as needed.  13.  Vitamin B12 at 1000 mcg injectable once a month.  14.  Vitamin D3 at 1000 international units oral  2 times a day.   PHYSICAL EXAMINATION: VITAL SIGNS: Temperature 97.7, pulse of 73, blood pressure 140/56, saturating 98% on room air.  GENERAL: Obese, [name] patient lying in bed, overall seems comfortable, conversational, cooperative with examination. PSYCHIATRIC: Alert, oriented x 3, pleasant.  HEENT: Atraumatic, normocephalic. Oral mucosa moist and pink. External ears and nose normal. No pallor. No icterus. Pupils bilaterally equal and reactive to light.  NECK: Supple. No thyromegaly. No palpable lymph nodes. Trachea midline.  No carotid bruit or JVD.  CARDIOVASCULAR: S1, S2, without any murmurs. Peripheral pulses 2+. No edema.  RESPIRATORY: Normal work of breathing. Clear to auscultation both sides.  GASTROINTESTINAL: Soft abdomen, nontender. Bowel sounds present. No organomegaly palpable.  SKIN: Warm and dry. No petechiae, rash, ulcers.  MUSCULOSKELETAL: No joint swelling, redness, effusion of the large joints. Normal muscle tone.  NEUROLOGICAL: Motor strength 5/5 in upper and lower extremities. Sensation was intact all over.  LYMPHATIC: No cervical lymphadenopathy.  GENITOURINARY: No significant bladder distention.   LABORATORY STUDIES: Chest x-ray shows nothing acute.   Glucose of 249, BUN 18, creatinine 1.38 with sodium of 134, potassium 4.3, GFR 54. Troponin less than 0.02. WBC 8.6, hemoglobin 12.5, platelets 72,000.   EKG shows normal sinus rhythm with poor R wave progression in the atrial leads.  ASSESSMENT AND PLAN:  1.  Unstable angina in a patient of coronary artery disease, prior percutaneous coronary arteriogram in 2010. At this point, the patient will be admitted to the hospitalist service for further evaluation and management. He will be placed on bedrest. The patient has received a dose of Plavix.  THE PATIENT STATES THAT HE IS ALLERGIC TO ASPIRIN, WHICH HAS CAUSED PEPTIC ULCER DISEASE IN THE PAST. HE IS ALSO ALLERGIC TO NITROGLYCERIN, WHICH CAUSES HYPOTENSION. I have tried to discuss with the patient regarding using nitroglycerin in a monitored condition, but he does not want nitroglycerin. Use morphine for any pain he may have. He is scheduled for cath tomorrow with Dr. Rockey Situ. 2.  For his coronary artery disease, we will add Toprol-XL 25 mg daily. Continue the statin. He is on.  3.  Hypertension. Continue hydrochlorothiazide, add Toprol, decrease the Cardizem dose, monitor closely.  4.  Diabetes mellitus. Continue his glimepiride. Put him on sliding scale insulin, diabetic diet.  5.  Chronic kidney  disease stage 3, creatinine seems to be worse compared to his last available blood work. We will put him on gentle hydration 16 mL per hour considering he is going to get contrast with the catheterization.  6.  Chronic thrombocytopenia, stable. No bleeding. 7.  Chronic pain syndrome. Continue home medications.  8.  Deep venous thrombosis prophylaxis. Sequential compression devices. No heparin or Lovenox.  9.  Code status is full code.   TIME SPENT TODAY ON THIS CASE: Forty-five minutes.   ____________________________ Leia Alf Arabelle Bollig, MD srs:TT D: 08/25/2014 16:10:19 ET T: 08/25/2014 17:00:15 ET JOB#: [id number]  cc: Alveta Heimlich R. Darvin Neighbours, MD, <Dictator> Minna Merritts, MD Hewitt Blade. Sarina Ser, MD  Alveta Heimlich Arlice Colt MD ELECTRONICALLY SIGNED 08/27/2014 20:50

## 2014-12-28 NOTE — Consult Note (Signed)
General Aspect Primary Cardiologist: Dr. Rockey Situ, MD _______________________ 74 year old male with history of CAD with history of remote MI s/p PTCA to RCA in 1991 s/p PCI/DES to LAD in 2010, remote history of a-fib following back surgery (converted with improved pain control), HTN, HLD, DM2 with peripheral neuropathy, history of PE, sleep apnea (patient denies), and morbid obesity who called our office on 12/18 with a 2-3 week history of persistent chest pain that radiated to his neck and left arm. He came in for an EKG and cardiac enzymes that were WNL. He was advised we could set up an outpatient cath but to come to the ED should his pain worsen. He presented to the ED on 12/20 with persistent chest pain that radiated to his neck and left arm. Troponin negative x 4. EKG NSR, 75 bpm, few PACs/PVCs, baseline artifact, nonspecific st/t changes. He is for cardiac cath today.  _____________________  PMH: 1. CAD with history of remote MI s/p PTCA to RCA in 1991 s/p PCI/DES to LAD in 2010 2. Remote history of a-fib following back surgery (converted with improved pain control) 3. HTN 4. HLD 5. DM2 with peripheral neuroapthy 6. History of PE 7. Sleep apnea (patient denies) 8. Morbid obesity ______________________   Present Illness 74 year old male with the above problem list who presented to [facility] on 12/20 with a 2-3 week history of persistent chest pain that radiated to his neck and left arm. He was initially evaluated at our office through cardiac enzymes and EKG that were WNL.  Patient with known CAD with remote MI. Status post PTCA in [facility] at [facility] to the RCA.  Subsequent cath showed 75% LAD, known 100% RCA, 30% LCx with PCI/DES to LAD. No evaluations or interventions have been done since that time. He has a remote history of a-fib in the setting of pain related to back surgery. Once his pain was well controlled he converted back to NSR. No known further episodes of a-fib. He has been preparing to  undergo a 4th back surgery and has been off his Plavix for the past 2 months (stent is 95-78 years old). He is noted to have chronic thrombocytopenia. In prep for this surgery his was being treated by ortho with high-dose prednisone. Unfortunately, a around the same time his symptoms developed is when his prednisone was stopped without a taper.   Patient called our office with a 2-3 week history of persistent chest pain that radiated to his neck and left arm. He came in for EKG and cardiac enzymes, both of which were WNL. He was advised to come to the ER if his pain worsened. He presented to the ER on 12/20 for further evalution.   Upon his arrival to [facility] ED he was found to have troponin negative x 4, EKG NSR, 75, PACs, PVCs, baseline artifact, nonspecific st/t changes. Chest pain occurs both at rest and with exertion. It is associated with nausea. No diaphoresis, vomiting, SOB, presyncope, or syncope. No edema. He is currently chest pain free. He was started on Toprol 25 mg. He is scheduled for cath this AM.   Physical Exam:  GEN well developed, well nourished, no acute distress, obese   HEENT hearing intact to voice, moist oral mucosa   NECK supple   RESP normal resp effort  clear BS   CARD Regular rate and rhythm  Normal, S1, S2  No murmur   ABD denies tenderness  soft   EXTR negative edema  SKIN normal to palpation   NEURO motor/sensory function intact   PSYCH alert, A+O to time, place, person, good insight   Review of Systems:  Subjective/Chief Complaint Chest pain   General: Fatigue   Skin: No Complaints   ENT: No Complaints   Eyes: No Complaints   Neck: No Complaints   Respiratory: No Complaints   Cardiovascular: Chest pain or discomfort  Tightness   Gastrointestinal: Nausea   Genitourinary: No Complaints   Vascular: No Complaints   Musculoskeletal: No Complaints   Neurologic: No Complaints   Hematologic: No Complaints   Endocrine: No Complaints    Psychiatric: No Complaints   Review of Systems: All other systems were reviewed and found to be negative   Medications/Allergies Reviewed Medications/Allergies reviewed   Family & Social History:  Family and Social History:  Family History Coronary Artery Disease  Hypertension   Social History negative tobacco, negative ETOH, negative Illicit drugs   + Tobacco Prior (greater than 1 year)   Place of Living Home     Ulcer:    Hyperlipidemia:    HTN:    Gastric Reflux:    Cardiac Stents:    back injury:    neuropathy:    diabetes type 2:    Myocardial Infarct:    Appendectomy:    Cholecystectomy:    Back surgery x 2:    Hernia surgery:    Angioplasty x 2:          Admit Diagnosis:   UNSTABLE ANGINA: Onset Date: 26-Aug-2014, Status: Active, Description: UNSTABLE ANGINA  Home Medications: Medication Instructions Status  Percocet 5/325 oral tablet 1 tab(s) orally every 4 hours, As Needed - for Pain Active  Valium 5 mg oral tablet 1 tab(s) orally 3 times a day, As Needed Active  Viagra 100 mg oral tablet 1 tab(s) orally , As Needed Active  simvastatin 40 mg oral tablet 1 tab(s) orally once a day (at bedtime) Active  Vitamin D3 1000 intl units oral tablet 1 tab(s) orally 2 times a day Active  glimepiride tablet 4 mg 1 tab(s) orally 2 times a day Active  Cardizem CD 180 mg/24 hours capsule, extended release 1 cap(s) orally once a day Active  Fish Oil 1000 mg oral capsule 2 cap(s) orally once a day Active  hydrochlorothiazide 25 mg oral tablet 1 tab(s) orally once a day Active  omeprazole 20 mg oral delayed release capsule 1 cap(s) orally 2 times a day Active  Flonase 50 mcg/inh nasal spray 1 spray(s) nasal once a day Active  oxyCODONE 5 mg oral tablet 1 tab(s) orally every 6 hours, As Needed Active  testosterone cypionate 200 mg/mL intramuscular solution 1 milliliter(s) intramuscular once a month Active  Vitamin B-12 1000 mcg/mL injectable solution 1  milliliter(s) injectable once a month Active   Lab Results:  Routine Chem:  21-Dec-15 03:55   Cholesterol, Serum 94  Triglycerides, Serum 191  HDL (INHOUSE)  23  VLDL Cholesterol Calculated 38  LDL Cholesterol Calculated 33 (Result(s) reported on 26 Aug 2014 at 09:40AM.)  Glucose, Serum  156  BUN  19  Creatinine (comp) 1.11  Sodium, Serum  135  Potassium, Serum 4.2  Chloride, Serum 101  CO2, Serum 29  Calcium (Total), Serum 9.2  Anion Gap  5  Osmolality (calc) 276  eGFR (African American) >60  eGFR (Non-African American) >60 (eGFR values <51m/min/1.73 m2 may be an indication of chronic kidney disease (CKD). Calculated eGFR, using the MRDR Study equation, is useful in  patients  with stable renal function. The eGFR calculation will not be reliable in acutely ill patients when serum creatinine is changing rapidly. It is not useful in patients on dialysis. The eGFR calculation may not be applicable to patients at the low and high extremes of body sizes, pregnant women, and vegetarians.)  Cardiac:  20-Dec-15 13:49   Troponin I < 0.02 (0.00-0.05 0.05 ng/mL or less: NEGATIVE  Repeat testing in 3-6 hrs  if clinically indicated. >0.05 ng/mL: POTENTIAL  MYOCARDIAL INJURY. Repeat  testing in 3-6 hrs if  clinically indicated. NOTE: An increase or decrease  of 30% or more on serial  testing suggests a  clinically important change)    17:54   Troponin I < 0.02 (0.00-0.05 0.05 ng/mL or less: NEGATIVE  Repeat testing in 3-6 hrs  if clinically indicated. >0.05 ng/mL: POTENTIAL  MYOCARDIAL INJURY. Repeat  testing in 3-6 hrs if  clinically indicated. NOTE: An increase or decrease  of 30% or more on serial  testing suggests a  clinically important change)    22:34   Troponin I < 0.02 (0.00-0.05 0.05 ng/mL or less: NEGATIVE  Repeat testing in 3-6 hrs  if clinically indicated. >0.05 ng/mL: POTENTIAL  MYOCARDIAL INJURY. Repeat  testing in 3-6 hrs if  clinically  indicated. NOTE: An increase or decrease  of 30% or more on serial  testing suggests a  clinically important change)  Routine Coag:  21-Dec-15 03:55   Prothrombin 14.3  INR 1.1 (INR reference interval applies to patients on anticoagulant therapy. A single INR therapeutic range for coumarins is not optimal for all indications; however, the suggested range for most indications is 2.0 - 3.0. Exceptions to the INR Reference Range may include: Prosthetic heart valves, acute myocardial infarction, prevention of myocardial infarction, and combinations of aspirin and anticoagulant. The need for a higher or lower target INR must be assessed individually. Reference: The Pharmacology and Management of the Vitamin K  antagonists: the seventh ACCP Conference on Antithrombotic and Thrombolytic Therapy. BULAG.5364 Sept:126 (3suppl): N9146842. A HCT value >55% may artifactually increase the PT.  In one study,  the increase was an average of 25%. Reference:  "Effect on Routine and Special Coagulation Testing Values of Citrate Anticoagulant Adjustment in Patients with High HCT Values." American Journal of Clinical Pathology 2006;126:400-405.)   EKG:  EKG Interp. by me   Interpretation EKG shows NSR, 75, PACs/PVCs, baseline artifact, nonspecific st/t changes   Radiology Results: XRay:    20-Dec-15 15:24, Chest Portable Single View  Chest Portable Single View   REASON FOR EXAM:    chest pain  COMMENTS:       PROCEDURE: DXR - DXR PORTABLE CHEST SINGLE VIEW  - Aug 25 2014  3:24PM     CLINICAL DATA:  Chest pain radiating into the left arm and left side  of the neck for 2-3 days.    EXAM:  PORTABLE CHEST - 1 VIEW    COMPARISON:  PA and lateral chest 04/20/2010.    FINDINGS:  The lungs are clear. Heart size is upper normal. There is no  pneumothorax or pleural effusion. No focal bony abnormality is  identified.     IMPRESSION:  No acute disease.      Electronically Signed    By:  Inge Rise M.D.    On: 08/25/2014 15:47         Verified By: Ramond Dial, M.D.,    Novocain: Other  Nitroglycerin: Other  Metformin: N/V/Diarrhea  Aspirin: GI Distress  Amitriptyline: Unknown  Neurontin: Unknown  Lyrica: Unknown  Vital Signs/Nurse's Notes: **Vital Signs.:   21-Dec-15 04:34  Vital Signs Type Routine  Temperature Temperature (F) 98.6  Celsius 37  Pulse Pulse 71  Respirations Respirations 18  Systolic BP Systolic BP 211  Diastolic BP (mmHg) Diastolic BP (mmHg) 68  Mean BP 81  Pulse Ox % Pulse Ox % 94  Pulse Ox Activity Level  At rest  Oxygen Delivery Room Air/ 21 %    Impression 74 year old male with history of CAD with history of remote MI s/p PTCA to RCA in 1991 s/p PCI/DES to LAD in 2010, remote history of a-fib following back surgery (converted with improved pain control), HTN, HLD, DM2 with peripheral neuropathy, history of PE, sleep apnea (patient denies), and morbid obesity who called our office on 12/18 with a 2-3 week history of persistent chest pain that radiated to his neck and left arm. He came in for an EKG and cardiac enzymes that were WNL. He was advised we could set up an outpatient cath but to come to the ED should his pain worsen. He presented to the ED on 12/20 with persistent chest pain that radiated to his neck and left arm. Troponin negative x 4. EKG NSR, 75 bpm, few PACs/PVCs, baseline artifact, nonspecific st/t changes. He is for cardiac cath today.   1. Chest pain with moderate risk for cardiac etiology: cardiac cath this morning - risk and benefits of cardiac cath were discussed with him in detail including bleeding, bruising, infection, renal damage, stroke, and death. He understands these risks and is willing to move forward with the procedure. -If cardiac cath is unrarkable for cardiac etiology of symptoms given his recent history of high-dose prednisone and discontinuation without taper and onset of symptoms along this  timeline could consider cortisol level vs ACTH stim test to ensure adrenal function   2. CAD as above: -Continue Toprol 25 mg, simvastatin 40 mg  3. HTN: -Stable -Toprol 25 mg  -Diltiazem 120 mg  3. HLD:  -Check FLP -Simvastatin 40 mg  4. DM2 with peripheral neuropathy: -SSI per IM  5. Chronic thrombocytopenia: -Stable  6. Chronic back pain: -No further plans to move forward with surgery  7. CKD stage III: -Stable   Electronic Signatures: Rise Mu (PA-C)  (Signed 21-Dec-15 09:02)  Authored: General Aspect/Present Illness, History and Physical Exam, Review of System, Family & Social History, Past Medical History, Home Medications, Labs, EKG , Radiology, Allergies, Vital Signs/Nurse's Notes, Impression/Plan Ida Rogue (MD)  (Signed 21-Dec-15 09:51)  Authored: General Aspect/Present Illness, History and Physical Exam, Review of System, Family & Social History, Health Issues, Labs, EKG , Impression/Plan  Co-Signer: General Aspect/Present Illness, Home Medications, Allergies   Last Updated: 21-Dec-15 09:51 by Ida Rogue (MD)

## 2014-12-28 NOTE — Consult Note (Signed)
Novocain: Other  Nitroglycerin: Other  Metformin: N/V/Diarrhea  Aspirin: GI Distress  Amitriptyline: Unknown  Neurontin: Unknown  Lyrica: Unknown   Impression Contacted by teh ER that Mr. Eloy End presented to the ER with chest pain. He is seen in [facility] clinic as an outpt. He called the office for chest pain sx on Friday. outpt EKG and troponin negative. We suggested he present to the ER for further discomfort. He presents today. --We will try to arrange cardiac cath for tomorrow late morning (schedule is full) NPO in the AM   Electronic Signatures: Ida Rogue (MD)  (Signed 20-Dec-15 15:41)  Authored: Allergies, Impression/Plan   Last Updated: 20-Dec-15 15:41 by Ida Rogue (MD)

## 2014-12-30 ENCOUNTER — Ambulatory Visit
Admit: 2014-12-30 | Disposition: A | Discharge status: Home / Self Care | Payer: Self-pay | Attending: Urology | Admitting: Urology

## 2015-02-10 DIAGNOSIS — D693 Immune thrombocytopenic purpura: Secondary | ICD-10-CM | POA: Insufficient documentation

## 2015-03-17 ENCOUNTER — Encounter: Payer: Self-pay | Admitting: Cardiovascular Disease

## 2015-03-17 ENCOUNTER — Ambulatory Visit (INDEPENDENT_AMBULATORY_CARE_PROVIDER_SITE_OTHER): Payer: Medicare Other | Admitting: Cardiovascular Disease

## 2015-03-17 VITALS — BP 144/60 | HR 64 | Ht 72.0 in | Wt 271.5 lb

## 2015-03-17 DIAGNOSIS — I2510 Atherosclerotic heart disease of native coronary artery without angina pectoris: Secondary | ICD-10-CM

## 2015-03-17 DIAGNOSIS — I4891 Unspecified atrial fibrillation: Secondary | ICD-10-CM

## 2015-03-17 DIAGNOSIS — G8929 Other chronic pain: Secondary | ICD-10-CM

## 2015-03-17 DIAGNOSIS — E785 Hyperlipidemia, unspecified: Secondary | ICD-10-CM

## 2015-03-17 DIAGNOSIS — M549 Dorsalgia, unspecified: Secondary | ICD-10-CM

## 2015-03-17 NOTE — Progress Notes (Signed)
Patient ID: Cody Price, male    DOB: [birth year], 74 y.o.   MRN: [id number]  HPI Comments: Cody Price is a 74 year old gentleman with a history of coronary artery disease, PTCA in 1991 of his RCA that later occluded 100%, with chest pain in September 2010 and stent placed to his LAD, also a history of atrial fibrillation following recent back surgery in the spontaneously converted with improved pain control presenting for routine follow-up of his coronary artery disease. history of diabetes and complications including lower extremity neuropathy. s/p back surgery for lumbar spinal stenosis and DJD  He is unable to tolerate aspirin due to her history of GI problems 20 years ago, erosion He previously stopped the metoprolol on his own as he reported having fatigue.  Previously seen by Dr. Grayland Ormond for low platelets, treated with prednisone  In follow-up today, he reports that he is doing well. Denies any significant chest pain or shortness of breath with exertion. Tries to stay active. Goes out early in the morning, tends to his garden, usually finishes his day in the mid afternoon Has neuropathy. Severe chronic back pain for which she takes pain medication  EKG on today's visit shows normal sinus rhythm with no significant ST or T-wave changes  Other past medical history He presented to the emergency room August 25 2014 with chest pain. Cardiac enzymes were negative Recovered well from his cardiac catheterization 08/26/2014. Cardiac catheterization was performed for chest pain. This showed an occluded proximal RCA which was chronic, mild to moderate proximal left circumflex, moderate distal left circumflex disease of a small vessel, moderate proximal OM disease moderate size vessel, LAD proximal stent patent, normal ejection fraction. Medical management was recommended.  Since the cardiac catheterization, he denies any further episodes of chest pain. He does have significant fatigue. He is  active, Limited in his activities by his back pain but denies any significant shortness of breath or chest pain with exertion.  Minimal lower extremity edema He denies any significant nausea, chest tightness or heaviness. This was his previous anginal equivalent.   lab work 08/26/2014 showing total cholesterol 94, LDL 33, HDL 23  Cardiac catheterization September 2000 and details a 75% proximal LAD lesion, 100% RCA lesion, 30% left circumflex lesion a xience 2.75 x 15 mm DES stent was placed.     Allergies  Allergen Reactions  . Nitroglycerin Other (See Comments)    Makes Blood pressure go to low.   . Procaine Hcl Other (See Comments)    Passes out    Outpatient Encounter Prescriptions as of 03/17/2015  Medication Sig  . Cholecalciferol (VITAMIN D) 2000 UNITS CAPS Take by mouth daily.    . clopidogrel (PLAVIX) 75 MG tablet Take 1 tablet (75 mg total) by mouth daily.  . Coenzyme Q10 (CO Q-10 PO) Take 1 capsule by mouth daily.   . Cyanocobalamin (VITAMIN B-12 IJ) Inject 1 vial into the muscle every 30 (thirty) days.   . diazepam (VALIUM) 5 MG tablet Take 5 mg by mouth every 12 (twelve) hours as needed for muscle spasms.   Marland Kitchen diltiazem (CARDIZEM CD) 180 MG 24 hr capsule Take 180 mg by mouth daily before breakfast.   . glimepiride (AMARYL) 4 MG tablet Take 4 mg by mouth daily. Sometimes pt. Takes this med. Twice daily  . hydrochlorothiazide 25 MG tablet Take 25 mg by mouth daily.    Marland Kitchen lisinopril (PRINIVIL,ZESTRIL) 10 MG tablet TAKE ONE TABLET BY MOUTH ONCE DAILY  . Omega-3 Fatty Acids (  TH FISH OIL EXTRA STRENGTH) 1200 MG CAPS Take 1 tablet by mouth daily.   Marland Kitchen omeprazole (PRILOSEC) 20 MG capsule Take 20 mg by mouth 2 (two) times daily.   Marland Kitchen oxyCODONE-acetaminophen (PERCOCET) 5-325 MG per tablet Take 2 tablets by mouth every 4 (four) hours as needed for moderate pain.   . sildenafil (VIAGRA) 100 MG tablet Take 100 mg by mouth daily as needed for erectile dysfunction.   . simvastatin (ZOCOR)  40 MG tablet Take 40 mg by mouth daily before breakfast.   . testosterone cypionate (DEPOTESTOSTERONE CYPIONATE) 200 MG/ML injection every 30 (thirty) days.   No facility-administered encounter medications on file as of 03/17/2015.    Past Medical History  Diagnosis Date  . CAD (coronary artery disease) 2010    stent  . Ischemic heart disease 1991    with angioplasty  . HLD (hyperlipidemia)   . Spinal stenosis   . Pernicious anemia   . Peripheral neuropathy   . Elevated PSA   . Pulmonary embolism     previous  . Morbid obesity   . Kidney stone   . Diabetes mellitus   . Hypertension   . Myocardial infarction   . Sleep apnea     pt. denies sleep apnea, pt. states he has had 2 studies - last one 2011  . Pneumonia 1995    /w PE,   . H/O CT scan     recent renal CT due to hematuria, cystoscopy - normal    . GERD (gastroesophageal reflux disease)   . History of hiatal hernia   . Arthritis     degenerative back     Past Surgical History  Procedure Laterality Date  . Cad      stent 2010  . Bilateral inguinal hernia repair    . Cholecystectomy    . Appendectomy    . Laminectomy    . Back surgery      x3 back surgeries - /w fusioin, 1- [place]  . Carpal tunnel release  2014    bilateral   . Cardiac catheterization  08/17/2014    Social History  reports that he quit smoking about 29 years ago. He does not have any smokeless tobacco history on file. He reports that he drinks alcohol. He reports that he does not use illicit drugs.  Family History family history includes Heart attack in his brother; Heart failure in his mother.   Review of Systems  Constitutional: Negative.   Respiratory: Negative.   Cardiovascular: Negative.   Gastrointestinal: Negative.   Musculoskeletal: Positive for back pain, arthralgias, neck pain and neck stiffness.       Pain in his hands  Neurological: Negative.   Hematological: Negative.   Psychiatric/Behavioral: Negative.    All other systems reviewed and are negative.   BP 144/60 mmHg  Pulse 64  Ht 6' (1.829 m)  Wt 271 lb 8 oz (123.152 kg)  BMI 36.81 kg/m2  Physical Exam  Constitutional: He is oriented to person, place, and time. He appears well-developed and well-nourished.  Obese  HENT:  Head: Normocephalic.  Nose: Nose normal.  Mouth/Throat: Oropharynx is clear and moist.  Eyes: Conjunctivae are normal. Pupils are equal, round, and reactive to light.  Neck: Normal range of motion. Neck supple. No JVD present.  Cardiovascular: Normal rate, regular rhythm, S1 normal, S2 normal, normal heart sounds and intact distal pulses.  Exam reveals no gallop and no friction rub.   No murmur heard. Pulmonary/Chest: Effort  normal and breath sounds normal. No respiratory distress. He has no wheezes. He has no rales. He exhibits no tenderness.  Abdominal: Soft. Bowel sounds are normal. He exhibits no distension. There is no tenderness.  Musculoskeletal: Normal range of motion. He exhibits no edema or tenderness.  Lymphadenopathy:    He has no cervical adenopathy.  Neurological: He is alert and oriented to person, place, and time. Coordination normal.  Skin: Skin is warm and dry. No rash noted. No erythema.  Psychiatric: He has a normal mood and affect. His behavior is normal. Judgment and thought content normal.      Assessment and Plan   Nursing note and vitals reviewed.

## 2015-03-17 NOTE — Assessment & Plan Note (Signed)
Maintaining normal sinus rhythm. No medication changes

## 2015-03-17 NOTE — Assessment & Plan Note (Signed)
Takes pain medication daily, Valium for sleep Several prior back surgeries. Was told he was not a candidate for additional back surgery as platelets were too low

## 2015-03-17 NOTE — Patient Instructions (Signed)
You are doing well. No medication changes were made.  Please call us if you have new issues that need to be addressed before your next appt.  Your physician wants you to follow-up in: 6 months.  You will receive a reminder letter in the mail two months in advance. If you don't receive a letter, please call our office to schedule the follow-up appointment.   

## 2015-03-17 NOTE — Assessment & Plan Note (Signed)
Currently with no symptoms of angina. No further workup at this time. Continue current medication regimen. 

## 2015-03-17 NOTE — Assessment & Plan Note (Signed)
Cholesterol is at goal on the current lipid regimen. No changes to the medications were made.  

## 2015-05-26 DIAGNOSIS — E538 Deficiency of other specified B group vitamins: Secondary | ICD-10-CM | POA: Insufficient documentation

## 2015-07-17 ENCOUNTER — Other Ambulatory Visit: Payer: Self-pay | Admitting: Cardiovascular Disease

## 2015-09-23 ENCOUNTER — Ambulatory Visit (INDEPENDENT_AMBULATORY_CARE_PROVIDER_SITE_OTHER): Payer: Medicare Other | Admitting: Cardiovascular Disease

## 2015-09-23 ENCOUNTER — Encounter: Payer: Self-pay | Admitting: Cardiovascular Disease

## 2015-09-23 VITALS — BP 145/60 | HR 63 | Ht 74.0 in | Wt 265.8 lb

## 2015-09-23 DIAGNOSIS — R0602 Shortness of breath: Secondary | ICD-10-CM | POA: Diagnosis not present

## 2015-09-23 DIAGNOSIS — M549 Dorsalgia, unspecified: Secondary | ICD-10-CM

## 2015-09-23 DIAGNOSIS — R079 Chest pain, unspecified: Secondary | ICD-10-CM | POA: Diagnosis not present

## 2015-09-23 DIAGNOSIS — G8929 Other chronic pain: Secondary | ICD-10-CM

## 2015-09-23 DIAGNOSIS — D696 Thrombocytopenia, unspecified: Secondary | ICD-10-CM

## 2015-09-23 DIAGNOSIS — I2510 Atherosclerotic heart disease of native coronary artery without angina pectoris: Secondary | ICD-10-CM

## 2015-09-23 DIAGNOSIS — I4891 Unspecified atrial fibrillation: Secondary | ICD-10-CM

## 2015-09-23 DIAGNOSIS — I209 Angina pectoris, unspecified: Secondary | ICD-10-CM | POA: Insufficient documentation

## 2015-09-23 DIAGNOSIS — E1159 Type 2 diabetes mellitus with other circulatory complications: Secondary | ICD-10-CM

## 2015-09-23 DIAGNOSIS — E785 Hyperlipidemia, unspecified: Secondary | ICD-10-CM

## 2015-09-23 NOTE — Progress Notes (Signed)
Patient ID: Cody Price, male    DOB: [birth year], 75 y.o.   MRN: [id number]  HPI Comments: Cody Price is a 75 year old gentleman with a history of coronary artery disease, PTCA in 1991 of his RCA that later occluded 100%, with chest pain in September 2010 and stent placed to his LAD, also a history of atrial fibrillation following recent back surgery in the spontaneously converted with improved pain control presenting for routine follow-up of his coronary artery disease. history of diabetes and complications including lower extremity neuropathy. s/p back surgery for lumbar spinal stenosis and DJD  He is unable to tolerate aspirin due to her history of GI problems 20 years ago, erosion He previously stopped the metoprolol on his own as he reported having fatigue.  Previously seen by Dr. Grayland Ormond for low platelets, treated with prednisone  In follow-up today, he reports having chest pain 2 days ago lasting all day Does not think it was his heart, just got his attention. Pain left of his mediastinum, not reproducible. May have over done it the day prior, lifted something heavy  Continues to have chronic health issues such as neuropathy in his hands and feet, chronic right low back pain Previously seen in the pain clinic, received shots, on pain medications Reports feeling weaker the past 2-3 weeks Platelets typically run 50-70  Recent lab work reviewed showing total cholesterol 111, hemoglobin A1c of 6  Nonsmoker EKG from today reviewed with him showing normal sinus rhythm, rate 66 bpm, no significant ST or T-wave changes, left axis deviation, no change from prior EKG  Other past medical history He presented to the emergency room August 25 2014 with chest pain. Cardiac enzymes were negative Recovered well from his cardiac catheterization 08/26/2014. Cardiac catheterization was performed for chest pain. This showed an occluded proximal RCA which was chronic, mild to moderate proximal left  circumflex, moderate distal left circumflex disease of a small vessel, moderate proximal OM disease moderate size vessel, LAD proximal stent patent, normal ejection fraction. Medical management was recommended.  Since the cardiac catheterization, he denies any further episodes of chest pain. He does have significant fatigue. He is active, Limited in his activities by his back pain but denies any significant shortness of breath or chest pain with exertion.  Minimal lower extremity edema He denies any significant nausea, chest tightness or heaviness. This was his previous anginal equivalent.   lab work 08/26/2014 showing total cholesterol 94, LDL 33, HDL 23  Cardiac catheterization September 2000 and details a 75% proximal LAD lesion, 100% RCA lesion, 30% left circumflex lesion a xience 2.75 x 15 mm DES stent was placed.     Allergies  Allergen Reactions  . Nitroglycerin Other (See Comments)    Makes Blood pressure go to low.   . Procaine Hcl Other (See Comments)    Passes out    Outpatient Encounter Prescriptions as of 09/23/2015  Medication Sig  . Cholecalciferol (VITAMIN D) 2000 UNITS CAPS Take by mouth daily.    . clopidogrel (PLAVIX) 75 MG tablet TAKE ONE TABLET BY MOUTH ONCE DAILY  . Coenzyme Q10 (CO Q-10 PO) Take 1 capsule by mouth daily.   . Cyanocobalamin (VITAMIN B-12 IJ) Inject 1 vial into the muscle every 30 (thirty) days.   . diazepam (VALIUM) 5 MG tablet Take 5 mg by mouth every 12 (twelve) hours as needed for muscle spasms.   Marland Kitchen diltiazem (CARDIZEM CD) 180 MG 24 hr capsule Take 180 mg by mouth daily before breakfast.   .  glimepiride (AMARYL) 4 MG tablet Take 4 mg by mouth daily. Sometimes pt. Takes this med. Twice daily  . hydrochlorothiazide 25 MG tablet Take 25 mg by mouth daily.    Marland Kitchen lisinopril (PRINIVIL,ZESTRIL) 10 MG tablet TAKE ONE TABLET BY MOUTH ONCE DAILY  . Omega-3 Fatty Acids (TH FISH OIL EXTRA STRENGTH) 1200 MG CAPS Take 1 tablet by mouth daily.   Marland Kitchen omeprazole  (PRILOSEC) 20 MG capsule Take 20 mg by mouth 2 (two) times daily.   Marland Kitchen oxyCODONE-acetaminophen (PERCOCET) 5-325 MG per tablet Take 2 tablets by mouth every 4 (four) hours as needed for moderate pain.   . sildenafil (VIAGRA) 100 MG tablet Take 100 mg by mouth daily as needed for erectile dysfunction.   . simvastatin (ZOCOR) 40 MG tablet Take 40 mg by mouth daily before breakfast.   . testosterone cypionate (DEPOTESTOSTERONE CYPIONATE) 200 MG/ML injection every 30 (thirty) days.   No facility-administered encounter medications on file as of 09/23/2015.    Past Medical History  Diagnosis Date  . CAD (coronary artery disease) 2010    stent  . Ischemic heart disease 1991    with angioplasty  . HLD (hyperlipidemia)   . Spinal stenosis   . Pernicious anemia   . Peripheral neuropathy ([place])   . Elevated PSA   . Pulmonary embolism ([place])     previous  . Morbid obesity ([place])   . Kidney stone   . Diabetes mellitus   . Hypertension   . Myocardial infarction ([place])   . Sleep apnea     pt. denies sleep apnea, pt. states he has had 2 studies - last one 2011  . Pneumonia 1995    /w PE,   . H/O CT scan     recent renal CT due to hematuria, cystoscopy - normal    . GERD (gastroesophageal reflux disease)   . History of hiatal hernia   . Arthritis     degenerative back     Past Surgical History  Procedure Laterality Date  . Cad      stent 2010  . Bilateral inguinal hernia repair    . Cholecystectomy    . Appendectomy    . Laminectomy    . Back surgery      x3 back surgeries - /w fusioin, 1- [place]  . Carpal tunnel release  2014    bilateral   . Cardiac catheterization  08/17/2014    Social History  reports that he quit smoking about 29 years ago. He does not have any smokeless tobacco history on file. He reports that he drinks alcohol. He reports that he does not use illicit drugs.  Family History family history includes Heart attack in his brother; Heart failure in his  mother.   Review of Systems  Constitutional: Negative.   Respiratory: Negative.   Cardiovascular: Negative.   Gastrointestinal: Negative.   Musculoskeletal: Positive for back pain, arthralgias, neck pain and neck stiffness.       Pain in his hands  Neurological: Negative.   Hematological: Negative.   Psychiatric/Behavioral: Negative.   All other systems reviewed and are negative.   BP 145/60 mmHg  Pulse 63  Ht 6\' 2"  (1.88 m)  Wt 265 lb 12 oz (120.543 kg)  BMI 34.11 kg/m2  Physical Exam  Constitutional: He is oriented to person, place, and time. He appears well-developed and well-nourished.  Obese  HENT:  Head: Normocephalic.  Nose: Nose normal.  Mouth/Throat: Oropharynx is clear and moist.  Eyes: Conjunctivae are normal. Pupils are equal, round, and reactive to light.  Neck: Normal range of motion. Neck supple. No JVD present.  Cardiovascular: Normal rate, regular rhythm, S1 normal, S2 normal, normal heart sounds and intact distal pulses.  Exam reveals no gallop and no friction rub.   No murmur heard. Pulmonary/Chest: Effort normal and breath sounds normal. No respiratory distress. He has no wheezes. He has no rales. He exhibits no tenderness.  Abdominal: Soft. Bowel sounds are normal. He exhibits no distension. There is no tenderness.  Musculoskeletal: Normal range of motion. He exhibits no edema or tenderness.  Lymphadenopathy:    He has no cervical adenopathy.  Neurological: He is alert and oriented to person, place, and time. Coordination normal.  Skin: Skin is warm and dry. No rash noted. No erythema.  Psychiatric: He has a normal mood and affect. His behavior is normal. Judgment and thought content normal.      Assessment and Plan   Nursing note and vitals reviewed.

## 2015-09-23 NOTE — Assessment & Plan Note (Signed)
Maintaining normal sinus rhythm. No medication changes 

## 2015-09-23 NOTE — Patient Instructions (Signed)
You are doing well. No medication changes were made.  Please call if you have more chest pain symptoms We might need to perform a cardiac cath  Please call us if you have new issues that need to be addressed before your next appt.  Your physician wants you to follow-up in: 6 months.  You will receive a reminder letter in the mail two months in advance. If you don't receive a letter, please call our office to schedule the follow-up appointment.

## 2015-09-23 NOTE — Assessment & Plan Note (Signed)
Hemoglobin A1c well controlled We have encouraged continued exercise, careful diet management in an effort to lose weight.

## 2015-09-23 NOTE — Assessment & Plan Note (Signed)
Long discussion today concerning his recent chest pain 2 days ago. He's been active the past 2 days with no recurrent symptoms. Etiology of his chest pain is unclear, unable to exclude ischemia. We have discussed this with him. He does not want stress testing or catheterization at this time. He reports he is unable to take nitroglycerin sublingual and he does not want any as this causes profound hypotension. He will call us if he has additional episodes of chest pain for workup at that time.

## 2015-09-23 NOTE — Assessment & Plan Note (Signed)
Cholesterol is at goal on the current lipid regimen. No changes to the medications were made.  

## 2015-09-23 NOTE — Assessment & Plan Note (Signed)
Recent chest pain symptoms discussed above. He will call for any additional episodes of chest discomfort. EKG unchanged, no symptoms in the past 2 days. Atypical in nature, presenting at rest.

## 2015-09-23 NOTE — Assessment & Plan Note (Signed)
Platelets typically run 50-70 Managed by hematology, previously treated with steroids

## 2015-09-23 NOTE — Assessment & Plan Note (Signed)
He reports he is not a good surgical candidate given low platelets Currently on pain medication

## 2015-09-25 ENCOUNTER — Encounter: Payer: Self-pay | Admitting: *Deleted

## 2015-09-30 ENCOUNTER — Encounter: Payer: Self-pay | Admitting: Urology

## 2015-09-30 ENCOUNTER — Ambulatory Visit (INDEPENDENT_AMBULATORY_CARE_PROVIDER_SITE_OTHER): Payer: Medicare Other | Admitting: Urology

## 2015-09-30 ENCOUNTER — Telehealth: Payer: Self-pay | Admitting: Urology

## 2015-09-30 VITALS — BP 152/70 | HR 83 | Ht 72.0 in | Wt 268.3 lb

## 2015-09-30 DIAGNOSIS — R351 Nocturia: Secondary | ICD-10-CM

## 2015-09-30 DIAGNOSIS — N528 Other male erectile dysfunction: Secondary | ICD-10-CM | POA: Diagnosis not present

## 2015-09-30 DIAGNOSIS — N529 Male erectile dysfunction, unspecified: Secondary | ICD-10-CM

## 2015-09-30 LAB — BLADDER SCAN AMB NON-IMAGING: SCAN RESULT: 0

## 2015-09-30 NOTE — Progress Notes (Signed)
09/30/2015 9:36 AM   Cody Price 12-13-1940 [id number]  Referring provider: Madelyn Brunner, MD [facility] [place], [place] [address]  Chief Complaint  Patient presents with  . Nocturia    x 1 month    HPI: Patient is 75 year old [name] who has experienced nocturia for the last month.  Prior to a month ago, he was not getting up at night to urinate. Now he is getting up twice nightly. He is experiencing increase in his back pain and this is causing him to wake up at night. It is then when he uses the restroom. He states the urge to urinate and does not wake him up. He has not experienced any dysuria, gross hematuria or suprapubic pain. He has not had any fevers, chills, nausea or vomiting. He has been tested for sleep apnea in the past and was found not to have apneic events.  He is I PSS score is 13/2. His PVR is 0 mL.      IPSS      09/30/15 0800       International Prostate Symptom Score   How often have you had the sensation of not emptying your bladder? Not at All     How often have you had to urinate less than every two hours? Almost always     How often have you found you stopped and started again several times when you urinated? Less than 1 in 5 times     How often have you found it difficult to postpone urination? Almost always     How often have you had a weak urinary stream? Not at All     How often have you had to strain to start urination? Not at All     How many times did you typically get up at night to urinate? 2 Times     Total IPSS Score 13     Quality of Life due to urinary symptoms   If you were to spend the rest of your life with your urinary condition just the way it is now how would you feel about that? Mostly Satisfied        Score:  1-7 Mild 8-19 Moderate 20-35 Severe  Erectile dysfunction He has been having difficulty with erections for several years.   His major complaint is achieving an erection.   His libido is preserved.   His risk factors for ED are age, hyperlipidemia, diabetes, anticoagulation therapy, hypertension, hypogonadism and BPH.  He denies any painful erections or curvatures with his erections.   He has tried PDE 5 inhibitors in the past, but they have been ineffective.  PMH: Past Medical History  Diagnosis Date  . CAD (coronary artery disease) 2010    stent  . Ischemic heart disease 1991    with angioplasty  . HLD (hyperlipidemia)   . Spinal stenosis   . Pernicious anemia   . Peripheral neuropathy ([place])   . Elevated PSA   . Pulmonary embolism ([place])     previous  . Morbid obesity ([place])   . Kidney stone   . Diabetes mellitus   . Hypertension   . Myocardial infarction ([place])   . Sleep apnea     pt. denies sleep apnea, pt. states he has had 2 studies - last one 2011  . Pneumonia 1995    /w PE,   . H/O CT scan     recent renal CT due to  hematuria, cystoscopy - normal    . GERD (gastroesophageal reflux disease)   . History of hiatal hernia   . Arthritis     degenerative back   . Renal cyst   . ED (erectile dysfunction)   . Hematuria   . BPH with obstruction/lower urinary tract symptoms   . History of kidney stones     Surgical History: Past Surgical History  Procedure Laterality Date  . Cad      stent 2010  . Bilateral inguinal hernia repair    . Cholecystectomy    . Appendectomy    . Laminectomy    . Back surgery      x3 back surgeries - /w fusioin, 1- [place]  . Carpal tunnel release  2014    bilateral   . Cardiac catheterization  08/17/2014    Home Medications:    Medication List       This list is accurate as of: 09/30/15  9:36 AM.  Always use your most recent med list.               clopidogrel 75 MG tablet  Commonly known as:  PLAVIX  TAKE ONE TABLET BY MOUTH ONCE DAILY     CO Q-10 PO  Take 1 capsule by mouth daily.     diltiazem 180 MG 24 hr capsule  Commonly known as:  CARDIZEM CD  Take 180 mg by mouth daily  before breakfast.     glimepiride 4 MG tablet  Commonly known as:  AMARYL  Take 4 mg by mouth daily. Sometimes pt. Takes this med. Twice daily     hydrochlorothiazide 25 MG tablet  Commonly known as:  HYDRODIURIL  Take 25 mg by mouth daily.     lisinopril 10 MG tablet  Commonly known as:  PRINIVIL,ZESTRIL  TAKE ONE TABLET BY MOUTH ONCE DAILY     omeprazole 20 MG capsule  Commonly known as:  PRILOSEC  Take 20 mg by mouth 2 (two) times daily.     PERCOCET 5-325 MG tablet  Generic drug:  oxyCODONE-acetaminophen  Take 2 tablets by mouth every 4 (four) hours as needed for moderate pain.     simvastatin 40 MG tablet  Commonly known as:  ZOCOR  Take 40 mg by mouth daily before breakfast.     testosterone cypionate 200 MG/ML injection  Commonly known as:  DEPOTESTOSTERONE CYPIONATE  every 30 (thirty) days.     TH FISH OIL EXTRA STRENGTH 1200 MG Caps  Take 1 tablet by mouth daily.     VALIUM 5 MG tablet  Generic drug:  diazepam  Take 5 mg by mouth every 12 (twelve) hours as needed for muscle spasms.     VIAGRA 100 MG tablet  Generic drug:  sildenafil  Take 100 mg by mouth daily as needed for erectile dysfunction.     VITAMIN B-12 IJ  Inject 1 vial into the muscle every 30 (thirty) days.     Vitamin D 2000 units Caps  Take by mouth daily.        Allergies:  Allergies  Allergen Reactions  . Amitriptyline   . Lyrica [Pregabalin]   . Neurontin [Gabapentin]   . Nitroglycerin Other (See Comments)    Makes Blood pressure go to low.   . Novocain [Procaine]   . Procaine Hcl Other (See Comments)    Passes out    Family History: Family History  Problem Relation Age of Onset  . Heart failure Mother   .  Heart attack Brother   . Skin cancer Sister   . Heart disease Father   . Kidney disease Neg Hx   . Prostate cancer Neg Hx     Social History:  reports that he quit smoking about 29 years ago. He does not have any smokeless tobacco history on file. He reports that he  drinks alcohol. He reports that he does not use illicit drugs.  ROS: UROLOGY Frequent Urination?: Yes Hard to postpone urination?: No Burning/pain with urination?: No Get up at night to urinate?: No Leakage of urine?: No Urine stream starts and stops?: No Trouble starting stream?: No Do you have to strain to urinate?: No Blood in urine?: No Urinary tract infection?: No Sexually transmitted disease?: No Injury to kidneys or bladder?: No Painful intercourse?: No Weak stream?: Yes Erection problems?: Yes Penile pain?: No  Gastrointestinal Nausea?: No Vomiting?: No Indigestion/heartburn?: No Diarrhea?: No Constipation?: No  Constitutional Fever: No Night sweats?: Yes Weight loss?: No Fatigue?: No  Skin Skin rash/lesions?: No Itching?: No  Eyes Blurred vision?: No Double vision?: No  Ears/Nose/Throat Sore throat?: No Sinus problems?: No  Hematologic/Lymphatic Swollen glands?: No Easy bruising?: No  Cardiovascular Leg swelling?: No Chest pain?: No  Respiratory Cough?: No Shortness of breath?: No  Endocrine Excessive thirst?: No  Musculoskeletal Back pain?: No Joint pain?: No  Neurological Headaches?: No Dizziness?: No  Psychologic Depression?: No Anxiety?: No  Physical Exam: BP 152/70 mmHg  Pulse 83  Ht 6' (1.829 m)  Wt 268 lb 4.8 oz (121.7 kg)  BMI 36.38 kg/m2  Constitutional: Well nourished. Alert and oriented, No acute distress. HEENT: [place] AT, moist mucus membranes. Trachea midline, no masses. Cardiovascular: No clubbing, cyanosis, or edema. Respiratory: Normal respiratory effort, no increased work of breathing. GI: Abdomen is soft, non tender, non distended, no abdominal masses. Liver and spleen not palpable.  No hernias appreciated.  Stool sample for occult testing is not indicated.   GU: No CVA tenderness.  No bladder fullness or masses.  Patient with uncircumcised phallus.  Foreskin easily retracted  Urethral meatus is patent.  No  penile discharge. No penile lesions or rashes. Scrotum without lesions, cysts, rashes and/or edema.  Testicles are located scrotally bilaterally. No masses are appreciated in the testicles. Left and right epididymis are normal. Rectal: Patient with  normal sphincter tone. Anus and perineum without scarring or rashes. No rectal masses are appreciated. Prostate (could not palpated entire gland due to buttocks tissue). Skin: No rashes, bruises or suspicious lesions. Lymph: No cervical or inguinal adenopathy. Neurologic: Grossly intact, no focal deficits, moving all 4 extremities. Psychiatric: Normal mood and affect.  Laboratory Data: Lab Results  Component Value Date   WBC 8.6 08/25/2014   HGB 12.5* 08/25/2014   HCT 35.5* 08/25/2014   MCV 88 08/25/2014   PLT 72* 08/25/2014   Lab Results  Component Value Date   CREATININE 1.11 08/26/2014   Lab Results  Component Value Date   AST 16 04/25/2014   Lab Results  Component Value Date   ALT 13* 04/25/2014    Pertinent Imaging: Results for DEMEIR, QUATTRONE (MRN [id number]) as of 09/30/2015 09:26  Ref. Range 09/30/2015 09:02  Scan Result Unknown 0    Assessment & Plan:    1. Nocturia:  Patient is not being woken of with the urge to urinate at this time. He is experiencing an increase in his back pain. He does not want to address his back pain further as he is frustrated with his  restriction on receiving narcotic pain medications. He would like a medication to see if it would reduce trips to the bathroom in the evening. I have given him Myrbetriq 50 mg samples.  I have advised the patient of the side effects of Myrbetriq, such as: elevation in BP, urinary retention and/or HA.  He will RTC in one month for an IPSS score and PVR.  - BLADDER SCAN AMB NON-IMAGING  2. Erectile dysfunction:   Patient is no longer having erections with PDE 5 inhibitors. He would like to pursue intracavernosal injections. [facility]  prescription for Trimix.  Patient will contact the office when he receives his tri-mix medication for his injection of a test dose of Trimix.     Return in about 1 month (around 10/31/2015) for IPSS score and PVR.  These notes generated with voice recognition software. I apologize for typographical errors.  Zara Council, [facility] [address], [place], [place] [address] [phone]

## 2015-09-30 NOTE — Telephone Encounter (Signed)
Medication has been called into [facility].

## 2015-09-30 NOTE — Telephone Encounter (Signed)
Would you call in a Trimix prescription to [facility]?

## 2015-10-02 DIAGNOSIS — R351 Nocturia: Secondary | ICD-10-CM | POA: Insufficient documentation

## 2015-10-02 DIAGNOSIS — N529 Male erectile dysfunction, unspecified: Secondary | ICD-10-CM | POA: Insufficient documentation

## 2015-11-03 ENCOUNTER — Inpatient Hospital Stay
Admission: EM | Admit: 2015-11-03 | Discharge: 2015-11-06 | DRG: 871 | Disposition: A | Discharge status: Home Health | Payer: Medicare Other | Attending: Internal Medicine | Admitting: Internal Medicine

## 2015-11-03 ENCOUNTER — Emergency Department: Payer: Medicare Other

## 2015-11-03 ENCOUNTER — Encounter: Payer: Self-pay | Admitting: Emergency Medicine

## 2015-11-03 ENCOUNTER — Ambulatory Visit: Payer: Medicare Other | Admitting: Urology

## 2015-11-03 DIAGNOSIS — Z888 Allergy status to other drugs, medicaments and biological substances status: Secondary | ICD-10-CM

## 2015-11-03 DIAGNOSIS — D696 Thrombocytopenia, unspecified: Secondary | ICD-10-CM | POA: Diagnosis present

## 2015-11-03 DIAGNOSIS — Z87891 Personal history of nicotine dependence: Secondary | ICD-10-CM | POA: Diagnosis not present

## 2015-11-03 DIAGNOSIS — K219 Gastro-esophageal reflux disease without esophagitis: Secondary | ICD-10-CM | POA: Diagnosis present

## 2015-11-03 DIAGNOSIS — G629 Polyneuropathy, unspecified: Secondary | ICD-10-CM | POA: Diagnosis present

## 2015-11-03 DIAGNOSIS — I4891 Unspecified atrial fibrillation: Secondary | ICD-10-CM | POA: Diagnosis present

## 2015-11-03 DIAGNOSIS — N529 Male erectile dysfunction, unspecified: Secondary | ICD-10-CM | POA: Diagnosis present

## 2015-11-03 DIAGNOSIS — R062 Wheezing: Secondary | ICD-10-CM

## 2015-11-03 DIAGNOSIS — Z955 Presence of coronary angioplasty implant and graft: Secondary | ICD-10-CM

## 2015-11-03 DIAGNOSIS — Z7952 Long term (current) use of systemic steroids: Secondary | ICD-10-CM

## 2015-11-03 DIAGNOSIS — I2510 Atherosclerotic heart disease of native coronary artery without angina pectoris: Secondary | ICD-10-CM | POA: Diagnosis present

## 2015-11-03 DIAGNOSIS — M549 Dorsalgia, unspecified: Secondary | ICD-10-CM | POA: Diagnosis present

## 2015-11-03 DIAGNOSIS — J9601 Acute respiratory failure with hypoxia: Secondary | ICD-10-CM | POA: Diagnosis present

## 2015-11-03 DIAGNOSIS — Z885 Allergy status to narcotic agent status: Secondary | ICD-10-CM | POA: Diagnosis not present

## 2015-11-03 DIAGNOSIS — Z6835 Body mass index (BMI) 35.0-35.9, adult: Secondary | ICD-10-CM

## 2015-11-03 DIAGNOSIS — N401 Enlarged prostate with lower urinary tract symptoms: Secondary | ICD-10-CM | POA: Diagnosis present

## 2015-11-03 DIAGNOSIS — A419 Sepsis, unspecified organism: Secondary | ICD-10-CM | POA: Diagnosis not present

## 2015-11-03 DIAGNOSIS — Z7902 Long term (current) use of antithrombotics/antiplatelets: Secondary | ICD-10-CM

## 2015-11-03 DIAGNOSIS — J189 Pneumonia, unspecified organism: Secondary | ICD-10-CM | POA: Diagnosis present

## 2015-11-03 DIAGNOSIS — Z7951 Long term (current) use of inhaled steroids: Secondary | ICD-10-CM

## 2015-11-03 DIAGNOSIS — G8929 Other chronic pain: Secondary | ICD-10-CM | POA: Diagnosis present

## 2015-11-03 DIAGNOSIS — E119 Type 2 diabetes mellitus without complications: Secondary | ICD-10-CM | POA: Diagnosis present

## 2015-11-03 DIAGNOSIS — R0602 Shortness of breath: Secondary | ICD-10-CM

## 2015-11-03 DIAGNOSIS — Z7984 Long term (current) use of oral hypoglycemic drugs: Secondary | ICD-10-CM | POA: Diagnosis not present

## 2015-11-03 DIAGNOSIS — I252 Old myocardial infarction: Secondary | ICD-10-CM | POA: Diagnosis not present

## 2015-11-03 DIAGNOSIS — E785 Hyperlipidemia, unspecified: Secondary | ICD-10-CM | POA: Diagnosis present

## 2015-11-03 DIAGNOSIS — Z79891 Long term (current) use of opiate analgesic: Secondary | ICD-10-CM

## 2015-11-03 DIAGNOSIS — I10 Essential (primary) hypertension: Secondary | ICD-10-CM | POA: Diagnosis present

## 2015-11-03 DIAGNOSIS — Z79899 Other long term (current) drug therapy: Secondary | ICD-10-CM | POA: Diagnosis not present

## 2015-11-03 DIAGNOSIS — Z86711 Personal history of pulmonary embolism: Secondary | ICD-10-CM

## 2015-11-03 DIAGNOSIS — M479 Spondylosis, unspecified: Secondary | ICD-10-CM | POA: Diagnosis present

## 2015-11-03 DIAGNOSIS — M4800 Spinal stenosis, site unspecified: Secondary | ICD-10-CM | POA: Diagnosis present

## 2015-11-03 LAB — CBC WITH DIFFERENTIAL/PLATELET
BASOS ABS: 0 10*3/uL (ref 0–0.1)
Basophils Relative: 0 %
EOS ABS: 0 10*3/uL (ref 0–0.7)
Eosinophils Relative: 0 %
HEMATOCRIT: 36.5 % — AB (ref 40.0–52.0)
HEMOGLOBIN: 12.9 g/dL — AB (ref 13.0–18.0)
LYMPHS PCT: 5 %
Lymphs Abs: 0.9 10*3/uL — ABNORMAL LOW (ref 1.0–3.6)
MCH: 30.9 pg (ref 26.0–34.0)
MCHC: 35.4 g/dL (ref 32.0–36.0)
MCV: 87.2 fL (ref 80.0–100.0)
Monocytes Absolute: 6 10*3/uL — ABNORMAL HIGH (ref 0.2–1.0)
Monocytes Relative: 32 %
NEUTROS PCT: 63 %
Neutro Abs: 12 10*3/uL — ABNORMAL HIGH (ref 1.4–6.5)
Platelets: 75 10*3/uL — ABNORMAL LOW (ref 150–440)
RBC: 4.19 MIL/uL — AB (ref 4.40–5.90)
RDW: 14.1 % (ref 11.5–14.5)
WBC: 18.9 10*3/uL — AB (ref 3.8–10.6)

## 2015-11-03 LAB — COMPREHENSIVE METABOLIC PANEL
ALK PHOS: 44 U/L (ref 38–126)
ALT: 14 U/L — ABNORMAL LOW (ref 17–63)
ANION GAP: 9 (ref 5–15)
AST: 25 U/L (ref 15–41)
Albumin: 4.1 g/dL (ref 3.5–5.0)
BILIRUBIN TOTAL: 1 mg/dL (ref 0.3–1.2)
BUN: 14 mg/dL (ref 6–20)
CALCIUM: 10 mg/dL (ref 8.9–10.3)
CO2: 25 mmol/L (ref 22–32)
Chloride: 101 mmol/L (ref 101–111)
Creatinine, Ser: 0.85 mg/dL (ref 0.61–1.24)
GFR calc non Af Amer: >60 mL/min (ref >60)
Glucose, Bld: 190 mg/dL — ABNORMAL HIGH (ref 65–99)
Potassium: 3.6 mmol/L (ref 3.5–5.1)
SODIUM: 135 mmol/L (ref 135–145)
TOTAL PROTEIN: 6.9 g/dL (ref 6.5–8.1)

## 2015-11-03 LAB — GLUCOSE, CAPILLARY
GLUCOSE-CAPILLARY: 338 mg/dL — AB (ref 65–99)
Glucose-Capillary: 368 mg/dL — ABNORMAL HIGH (ref 65–99)

## 2015-11-03 LAB — TROPONIN I: Troponin I: 0.03 ng/mL (ref <0.031)

## 2015-11-03 MED ORDER — PANTOPRAZOLE SODIUM 40 MG PO TBEC
40.0000 mg | DELAYED_RELEASE_TABLET | Freq: Every day | ORAL | Status: DC
  Administered 2015-11-04 – 2015-11-06 (×3): 40 mg via ORAL
  Filled 2015-11-03 (×3): qty 1

## 2015-11-03 MED ORDER — LISINOPRIL 10 MG PO TABS
10.0000 mg | ORAL_TABLET | Freq: Every day | ORAL | Status: DC
  Administered 2015-11-04 – 2015-11-06 (×3): 10 mg via ORAL
  Filled 2015-11-03 (×3): qty 1

## 2015-11-03 MED ORDER — DEXTROSE 5 % IV SOLN
1.0000 g | INTRAVENOUS | Status: DC
  Administered 2015-11-04 – 2015-11-05 (×2): 1 g via INTRAVENOUS
  Filled 2015-11-03 (×2): qty 10

## 2015-11-03 MED ORDER — OXYCODONE-ACETAMINOPHEN 5-325 MG PO TABS
2.0000 | ORAL_TABLET | ORAL | Status: DC | PRN
  Administered 2015-11-04 – 2015-11-06 (×5): 2 via ORAL
  Filled 2015-11-03 (×5): qty 2

## 2015-11-03 MED ORDER — SIMVASTATIN 40 MG PO TABS: 40.0000 mg | ORAL_TABLET | Freq: Every day | ORAL | Status: DC

## 2015-11-03 MED ORDER — HYDROCHLOROTHIAZIDE 25 MG PO TABS
25.0000 mg | ORAL_TABLET | Freq: Every day | ORAL | Status: DC
  Administered 2015-11-04 – 2015-11-06 (×3): 25 mg via ORAL
  Filled 2015-11-03 (×3): qty 1

## 2015-11-03 MED ORDER — IPRATROPIUM-ALBUTEROL 0.5-2.5 (3) MG/3ML IN SOLN
3.0000 mL | Freq: Four times a day (QID) | RESPIRATORY_TRACT | Status: DC
  Administered 2015-11-03 – 2015-11-04 (×4): 3 mL via RESPIRATORY_TRACT
  Filled 2015-11-03 (×5): qty 3

## 2015-11-03 MED ORDER — INSULIN ASPART 100 UNIT/ML ~~LOC~~ SOLN
0.0000 [IU] | Freq: Every day | SUBCUTANEOUS | Status: DC
  Administered 2015-11-03: 5 [IU] via SUBCUTANEOUS
  Filled 2015-11-03: qty 3

## 2015-11-03 MED ORDER — IPRATROPIUM-ALBUTEROL 0.5-2.5 (3) MG/3ML IN SOLN
9.0000 mL | Freq: Once | RESPIRATORY_TRACT | Status: AC
  Administered 2015-11-03: 9 mL via RESPIRATORY_TRACT
  Filled 2015-11-03: qty 9

## 2015-11-03 MED ORDER — SODIUM CHLORIDE 0.9 % IV SOLN
INTRAVENOUS | Status: DC
Start: 2015-11-03 — End: 2015-11-06
  Administered 2015-11-03 – 2015-11-05 (×5): via INTRAVENOUS

## 2015-11-03 MED ORDER — DIAZEPAM 2 MG PO TABS: 5.0000 mg | ORAL_TABLET | Freq: Two times a day (BID) | ORAL | Status: DC | PRN

## 2015-11-03 MED ORDER — DEXTROSE 5 % IV SOLN
1.0000 g | Freq: Once | INTRAVENOUS | Status: AC
  Administered 2015-11-03: 1 g via INTRAVENOUS
  Filled 2015-11-03: qty 10

## 2015-11-03 MED ORDER — ENOXAPARIN SODIUM 40 MG/0.4ML ~~LOC~~ SOLN
40.0000 mg | SUBCUTANEOUS | Status: DC
  Administered 2015-11-03 – 2015-11-05 (×3): 40 mg via SUBCUTANEOUS
  Filled 2015-11-03 (×3): qty 0.4

## 2015-11-03 MED ORDER — DEXTROSE 5 % IV SOLN
500.0000 mg | Freq: Once | INTRAVENOUS | Status: AC
  Administered 2015-11-03: 500 mg via INTRAVENOUS
  Filled 2015-11-03: qty 500

## 2015-11-03 MED ORDER — DEXTROSE 5 % IV SOLN
500.0000 mg | INTRAVENOUS | Status: DC
  Administered 2015-11-04 – 2015-11-05 (×2): 500 mg via INTRAVENOUS
  Filled 2015-11-03 (×2): qty 500

## 2015-11-03 MED ORDER — METHYLPREDNISOLONE SODIUM SUCC 125 MG IJ SOLR
125.0000 mg | Freq: Once | INTRAMUSCULAR | Status: AC
  Administered 2015-11-03: 125 mg via INTRAVENOUS
  Filled 2015-11-03: qty 2

## 2015-11-03 MED ORDER — INSULIN ASPART 100 UNIT/ML ~~LOC~~ SOLN
0.0000 [IU] | Freq: Three times a day (TID) | SUBCUTANEOUS | Status: DC
  Administered 2015-11-03: 9 [IU] via SUBCUTANEOUS
  Administered 2015-11-04: 0 [IU] via SUBCUTANEOUS
  Administered 2015-11-04: 2 [IU] via SUBCUTANEOUS
  Administered 2015-11-04: 3 [IU] via SUBCUTANEOUS
  Filled 2015-11-03: qty 3
  Filled 2015-11-03: qty 2
  Filled 2015-11-03: qty 9

## 2015-11-03 MED ORDER — DILTIAZEM HCL ER COATED BEADS 180 MG PO CP24
180.0000 mg | ORAL_CAPSULE | Freq: Every day | ORAL | Status: DC
  Administered 2015-11-04 – 2015-11-06 (×3): 180 mg via ORAL
  Filled 2015-11-03 (×3): qty 1

## 2015-11-03 MED ORDER — CLOPIDOGREL BISULFATE 75 MG PO TABS
75.0000 mg | ORAL_TABLET | Freq: Every day | ORAL | Status: DC
Start: 2015-11-04 — End: 2015-11-06
  Administered 2015-11-04 – 2015-11-06 (×3): 75 mg via ORAL
  Filled 2015-11-03 (×3): qty 1

## 2015-11-03 MED ORDER — GUAIFENESIN-DM 100-10 MG/5ML PO SYRP
5.0000 mL | ORAL_SOLUTION | ORAL | Status: DC | PRN
  Administered 2015-11-03 – 2015-11-05 (×3): 5 mL via ORAL
  Filled 2015-11-03 (×2): qty 5
  Filled 2015-11-03: qty 35

## 2015-11-03 NOTE — ED Notes (Signed)
Cough x 1 week, wife with same symptoms

## 2015-11-03 NOTE — ED Provider Notes (Signed)
Care transitioned from [facility]  ED ECG REPORT I, Cody Price, the attending physician, personally viewed and interpreted this ECG.   Date: 11/03/2015  EKG Time: 752  Rate: 99  Rhythm: normal sinus rhythm  Axis: Left axis deviation  Intervals:none  ST&T Change: No ST segment elevation or depression. T-wave inversions in 1 as well as aVL. T-wave inversions appear new from EKG done about one month ago on 09/23/2015.  Report from the PA is that the patient has been on antibiotics and is worsening. He was reporting a cough with some chest pain which is described as a pressure. I examined the patient at the bedside and he says that he has been on doxycycline over the past 2 days. He is also been taking prednisone without any improvement. He says this feels similar to when he had a pneumonia several years ago. He denies any smoking. Denies any sick contacts. Says he has been coughing for about 7-10 days. Mild amount of sputum production.  He is in no acute distress. Speaks in full sentences. However, he does have diffuse wheezing on his lung exam.  Chest x-ray showing a left lower lobe infiltrate consistent with pneumonia. Also within elevated white blood cell count of 18. Patient aware of need for admission to the hospital for this pneumonia. Has a failure of outpatient antibiotics. Signed out to Dr. Bridgett Larsson. Patient denies being a minute at a hospital in the past 3 months. We'll treat for community-acquired pneumonia. Also with new T-wave inversions. Troponin of 0.03.    Cody Pyo, MD 11/03/15 [phone]

## 2015-11-03 NOTE — H&P (Signed)
[facility] [place] NAME: Cody Price    MR#:  [id number]  DATE OF BIRTH:  19-Feb-1941  DATE OF ADMISSION:  11/03/2015  PRIMARY CARE PHYSICIAN: Madelyn Brunner, MD   REQUESTING/REFERRING PHYSICIAN: Orbie Pyo, MD  CHIEF COMPLAINT:   Chief Complaint  Patient presents with  . Cough   Cough, shortness breath and chest tightness for 1 week. HISTORY OF PRESENT ILLNESS:  Cody Price  is a 75 y.o. male with a known history of hypertension, diabetes, CAD and a pulmonary embolism. The patient has had cough, shortness of breath and chest tightness for the past one week. He started to have a cough 1 week ago and was diagnosed with bronchitis, treated with doxycycline, prednisone and nebulizer. Mother his symptoms has been worsening for the past that 2 days. He denies any fever or chills, no headache or dizziness no orthopnea or nocturnal dyspnea or leg edema.  PAST MEDICAL HISTORY:   Past Medical History  Diagnosis Date  . CAD (coronary artery disease) 2010    stent  . Ischemic heart disease 1991    with angioplasty  . HLD (hyperlipidemia)   . Spinal stenosis   . Pernicious anemia   . Peripheral neuropathy ([place])   . Elevated PSA   . Pulmonary embolism ([place])     previous  . Morbid obesity ([place])   . Kidney stone   . Diabetes mellitus   . Hypertension   . Myocardial infarction ([place])   . Sleep apnea     pt. denies sleep apnea, pt. states he has had 2 studies - last one 2011  . Pneumonia 1995    /w PE,   . H/O CT scan     recent renal CT due to hematuria, cystoscopy - normal    . GERD (gastroesophageal reflux disease)   . History of hiatal hernia   . Arthritis     degenerative back   . Renal cyst   . ED (erectile dysfunction)   . Hematuria   . BPH with obstruction/lower urinary tract symptoms   . History of kidney stones     PAST SURGICAL HISTORY:   Past Surgical History  Procedure Laterality Date  . Cad       stent 2010  . Bilateral inguinal hernia repair    . Cholecystectomy    . Appendectomy    . Laminectomy    . Back surgery      x3 back surgeries - /w fusioin, 1- [place]  . Carpal tunnel release  2014    bilateral   . Cardiac catheterization  08/17/2014    SOCIAL HISTORY:   Social History  Substance Use Topics  . Smoking status: Former Smoker -- 3.00 packs/day for 15 years    Quit date: 10/18/1985  . Smokeless tobacco: Not on file     Comment: tobacco use- no   . Alcohol Use: Yes     Comment: rare    FAMILY HISTORY:   Family History  Problem Relation Age of Onset  . Heart failure Mother   . Heart attack Brother   . Skin cancer Sister   . Heart disease Father   . Kidney disease Neg Hx   . Prostate cancer Neg Hx     DRUG ALLERGIES:   Allergies  Allergen Reactions  . Amitriptyline   . Lyrica [Pregabalin]   . Neurontin [Gabapentin]   . Nitroglycerin Other (See Comments)  Makes Blood pressure go to low.   . Novocain [Procaine]   . Procaine Hcl Other (See Comments)    Passes out    REVIEW OF SYSTEMS:  CONSTITUTIONAL: No fever, fatigue or weakness.  EYES: No blurred or double vision.  EARS, NOSE, AND THROAT: No tinnitus or ear pain.  RESPIRATORY: has cough, shortness of breath, wheezing but no hemoptysis.  CARDIOVASCULAR: Has chest tightness, no orthopnea, edema.  GASTROINTESTINAL: No nausea, vomiting, diarrhea or abdominal pain.  GENITOURINARY: No dysuria, hematuria.  ENDOCRINE: No polyuria, nocturia,  HEMATOLOGY: No anemia, easy bruising or bleeding SKIN: No rash or lesion. MUSCULOSKELETAL: No joint pain or arthritis.   NEUROLOGIC: No tingling, numbness, weakness.  PSYCHIATRY: No anxiety or depression.   MEDICATIONS AT HOME:   Prior to Admission medications   Medication Sig Start Date End Date Taking? Authorizing Provider  albuterol (PROAIR HFA) 108 (90 Base) MCG/ACT inhaler Inhale 1-2 puffs into the lungs every 6 (six) hours as  needed. 11/01/15 10/31/16 Yes Historical Provider, MD  Cholecalciferol (VITAMIN D) 2000 UNITS CAPS Take by mouth daily.     Yes Historical Provider, MD  clopidogrel (PLAVIX) 75 MG tablet TAKE ONE TABLET BY MOUTH ONCE DAILY 07/18/15  Yes Minna Merritts, MD  Coenzyme Q10 (CO Q-10 PO) Take 1 capsule by mouth daily.    Yes Historical Provider, MD  Cyanocobalamin (VITAMIN B-12 IJ) Inject 1 vial into the muscle every 30 (thirty) days.    Yes Historical Provider, MD  diazepam (VALIUM) 5 MG tablet Take 5 mg by mouth every 12 (twelve) hours as needed for muscle spasms.    Yes Historical Provider, MD  diltiazem (CARDIZEM CD) 180 MG 24 hr capsule Take 180 mg by mouth daily before breakfast.    Yes Historical Provider, MD  doxycycline (VIBRA-TABS) 100 MG tablet Take 1 tablet by mouth 2 (two) times daily. 11/01/15 11/11/15 Yes Historical Provider, MD  glimepiride (AMARYL) 4 MG tablet Take 4 mg by mouth daily. Sometimes pt. Takes this med. Twice daily   Yes Historical Provider, MD  hydrochlorothiazide 25 MG tablet Take 25 mg by mouth daily.     Yes Historical Provider, MD  HYDROcodone-homatropine (HYCODAN) 5-1.5 MG/5ML syrup Take 5 mLs by mouth every 6 (six) hours as needed. 11/01/15 11/11/15 Yes Historical Provider, MD  lisinopril (PRINIVIL,ZESTRIL) 10 MG tablet TAKE ONE TABLET BY MOUTH ONCE DAILY 07/18/15  Yes Minna Merritts, MD  Omega-3 Fatty Acids (TH FISH OIL EXTRA STRENGTH) 1200 MG CAPS Take 1 tablet by mouth daily.    Yes Historical Provider, MD  omeprazole (PRILOSEC) 20 MG capsule Take 20 mg by mouth 2 (two) times daily.    Yes Historical Provider, MD  oxyCODONE-acetaminophen (PERCOCET) 5-325 MG per tablet Take 2 tablets by mouth every 4 (four) hours as needed for moderate pain.    Yes Historical Provider, MD  predniSONE (DELTASONE) 10 MG tablet Take 1-2 tablets by mouth daily. Take 2 tablets for five days, then 1 tablet for five days 11/01/15 11/11/15 Yes Historical Provider, MD  sildenafil (VIAGRA) 100 MG  tablet Take 100 mg by mouth daily as needed for erectile dysfunction.    Yes Historical Provider, MD  simvastatin (ZOCOR) 40 MG tablet Take 40 mg by mouth daily before breakfast.    Yes Historical Provider, MD  testosterone cypionate (DEPOTESTOSTERONE CYPIONATE) 200 MG/ML injection every 30 (thirty) days. 02/11/15  Yes Historical Provider, MD      VITAL SIGNS:  Blood pressure 111/53, pulse 86, temperature 98.8 F (37.1 C),  temperature source Oral, resp. rate 25, height 6' (1.829 m), weight 120.203 kg (265 lb), SpO2 95 %.  PHYSICAL EXAMINATION:  GENERAL:  75 y.o.-year-old patient lying in the bed with no acute distress. Obese. EYES: Pupils equal, round, reactive to light and accommodation. No scleral icterus. Extraocular muscles intact.  HEENT: Head atraumatic, normocephalic. Oropharynx and nasopharynx clear.  NECK:  Supple, no jugular venous distention. No thyroid enlargement, no tenderness.  LUNGS: Normal breath sounds bilaterally, no wheezing, rales or rhonchi but has crackles. No use of accessory muscles of respiration.  CARDIOVASCULAR: S1, S2 normal. No murmurs, rubs, or gallops.  ABDOMEN: Soft, nontender, nondistended. Bowel sounds present. No organomegaly or mass.  EXTREMITIES: No pedal edema, cyanosis, or clubbing.  NEUROLOGIC: Cranial nerves II through XII are intact. Muscle strength 5/5 in all extremities. Sensation intact. Gait not checked.  PSYCHIATRIC: The patient is alert and oriented x 3.  SKIN: No obvious rash, lesion, or ulcer.   LABORATORY PANEL:   CBC  Recent Labs Lab 11/03/15 0815  WBC 18.9*  HGB 12.9*  HCT 36.5*  PLT 75*   ------------------------------------------------------------------------------------------------------------------  Chemistries   Recent Labs Lab 11/03/15 0815  NA 135  K 3.6  CL 101  CO2 25  GLUCOSE 190*  BUN 14  CREATININE 0.85  CALCIUM 10.0  AST 25  ALT 14*  ALKPHOS 44  BILITOT 1.0    ------------------------------------------------------------------------------------------------------------------  Cardiac Enzymes  Recent Labs Lab 11/03/15 0815  TROPONINI 0.03   ------------------------------------------------------------------------------------------------------------------  RADIOLOGY:  Dg Chest 2 View  11/03/2015  CLINICAL DATA:  Cough. EXAM: CHEST  2 VIEW COMPARISON:  08/25/2014. FINDINGS: Mediastinum and hilar structures are normal. Borderline cardiomegaly. Left lower lobe infiltrate most consistent with pneumonia. No pleural effusion or pneumothorax . IMPRESSION: 1. Left lower lobe infiltrate consistent with pneumonia. 2. Borderline cardiomegaly. Electronically Signed   By: Marcello Moores  Register   On: 11/03/2015 08:10    EKG:   Orders placed or performed during the hospital encounter of 11/03/15  . ED EKG  . ED EKG    IMPRESSION AND PLAN:   Pneumonia (LLL, CAP) The patient will be admitted to medical floor. I will continue Zithromax and Rocephin, follow-up CBC, blood culture and sputum culture. Give DuoNeb and Robitussin is seen when necessary.  Leukoctyosis. Due to pneumonia, follow-up CBC.  HTN. Controlled, continue hypertension medication. DM. Hold by mouth diabetes medication and start sliding scale, check hemoglobin A1c.  CAD. Continue Plavix and Zocor. Chronic thrombocytopenia. Stable  All the records are reviewed and case discussed with ED provider. Management plans discussed with the patient, his wife and they are in agreement.  CODE STATUS: full code.  TOTAL TIME TAKING CARE OF THIS PATIENT: 56 minutes.    Demetrios Loll M.D on 11/03/2015 at 10:56 AM  Between 7am to 6pm - Pager - [phone]  After 6pm go to [email or web address] - password [email or web address] [facility]  Office  [phone]  CC: Primary care physician; Madelyn Brunner, MD

## 2015-11-03 NOTE — ED Notes (Addendum)
States he developed a cough about 1 week. Has been seen by his PCP and placed on antibiotics but states he feels worse  Was unable to lie flat last pm to sleep  resp labored with exertion   Also having some chest discomfort

## 2015-11-03 NOTE — ED Notes (Signed)
Pt moved to room 7  Report given to [employer]

## 2015-11-03 NOTE — ED Provider Notes (Signed)
[facility] Emergency Department Provider Note  ____________________________________________  Time seen: Approximately 7:32 AM  I have reviewed the triage vital signs and the nursing notes.   HISTORY  Chief Complaint Cough    HPI Cody Price is a 75 y.o. male who presents to emergency department complaining of cough, shortness of breath, chest tightness/pain. Patient states that symptoms have been ongoing 1 week. Initially symptoms began with cough. He was seen by his primary care provider 3 days prior and was diagnosed with "bronchitis" and placed on antibiotics, prednisone, and albuterol inhaler. Patient states that his symptoms have continued to worsen. He states over the last 36 hours he has developed shortness of breath that is unresolved with use of medications.Patient does have a significant cardiac history and upon questioning patient is endorsing some "chest tightness/pain" over the last 24 hours. Patient denies fevers or chills, headache, neck pain, nasal congestion, sore throat, abdominal pain, nausea or vomiting.   Past Medical History  Diagnosis Date  . CAD (coronary artery disease) 2010    stent  . Ischemic heart disease 1991    with angioplasty  . HLD (hyperlipidemia)   . Spinal stenosis   . Pernicious anemia   . Peripheral neuropathy ([place])   . Elevated PSA   . Pulmonary embolism ([place])     previous  . Morbid obesity ([place])   . Kidney stone   . Diabetes mellitus   . Hypertension   . Myocardial infarction ([place])   . Sleep apnea     pt. denies sleep apnea, pt. states he has had 2 studies - last one 2011  . Pneumonia 1995    /w PE,   . H/O CT scan     recent renal CT due to hematuria, cystoscopy - normal    . GERD (gastroesophageal reflux disease)   . History of hiatal hernia   . Arthritis     degenerative back   . Renal cyst   . ED (erectile dysfunction)   . Hematuria   . BPH with obstruction/lower urinary tract symptoms   .  History of kidney stones     Patient Active Problem List   Diagnosis Date Noted  . Nocturia 10/02/2015  . Erectile dysfunction of organic origin 10/02/2015  . Angina pectoris ([place]) 09/23/2015  . Thrombocytopenia ([place]) 09/23/2015  . Chronic back pain 06/21/2013  . Diabetes mellitus ([place]) 06/20/2012  . Hyperlipidemia 05/28/2010  . Coronary atherosclerosis 05/28/2010  . ATRIAL FIBRILLATION 05/28/2010    Past Surgical History  Procedure Laterality Date  . Cad      stent 2010  . Bilateral inguinal hernia repair    . Cholecystectomy    . Appendectomy    . Laminectomy    . Back surgery      x3 back surgeries - /w fusioin, 1- [place]  . Carpal tunnel release  2014    bilateral   . Cardiac catheterization  08/17/2014    Current Outpatient Rx  Name  Route  Sig  Dispense  Refill  . Cholecalciferol (VITAMIN D) 2000 UNITS CAPS   Oral   Take by mouth daily.           . clopidogrel (PLAVIX) 75 MG tablet      TAKE ONE TABLET BY MOUTH ONCE DAILY   90 tablet   2   . Coenzyme Q10 (CO Q-10 PO)   Oral   Take 1 capsule by mouth daily.          Marland Kitchen  Cyanocobalamin (VITAMIN B-12 IJ)   Intramuscular   Inject 1 vial into the muscle every 30 (thirty) days.          . diazepam (VALIUM) 5 MG tablet   Oral   Take 5 mg by mouth every 12 (twelve) hours as needed for muscle spasms.          Marland Kitchen diltiazem (CARDIZEM CD) 180 MG 24 hr capsule   Oral   Take 180 mg by mouth daily before breakfast.          . glimepiride (AMARYL) 4 MG tablet   Oral   Take 4 mg by mouth daily. Sometimes pt. Takes this med. Twice daily         . hydrochlorothiazide 25 MG tablet   Oral   Take 25 mg by mouth daily.           Marland Kitchen lisinopril (PRINIVIL,ZESTRIL) 10 MG tablet      TAKE ONE TABLET BY MOUTH ONCE DAILY   90 tablet   2   . Omega-3 Fatty Acids (TH FISH OIL EXTRA STRENGTH) 1200 MG CAPS   Oral   Take 1 tablet by mouth daily.          Marland Kitchen omeprazole (PRILOSEC) 20 MG capsule    Oral   Take 20 mg by mouth 2 (two) times daily.          Marland Kitchen oxyCODONE-acetaminophen (PERCOCET) 5-325 MG per tablet   Oral   Take 2 tablets by mouth every 4 (four) hours as needed for moderate pain.          . sildenafil (VIAGRA) 100 MG tablet   Oral   Take 100 mg by mouth daily as needed for erectile dysfunction.          . simvastatin (ZOCOR) 40 MG tablet   Oral   Take 40 mg by mouth daily before breakfast.          . testosterone cypionate (DEPOTESTOSTERONE CYPIONATE) 200 MG/ML injection      every 30 (thirty) days.           Allergies Amitriptyline; Lyrica; Neurontin; Nitroglycerin; Novocain; and Procaine hcl  Family History  Problem Relation Age of Onset  . Heart failure Mother   . Heart attack Brother   . Skin cancer Sister   . Heart disease Father   . Kidney disease Neg Hx   . Prostate cancer Neg Hx     Social History Social History  Substance Use Topics  . Smoking status: Former Smoker -- 3.00 packs/day for 15 years    Quit date: 10/18/1985  . Smokeless tobacco: None     Comment: tobacco use- no   . Alcohol Use: Yes     Comment: rare     Review of Systems  Constitutional: No fever/chills Eyes: No visual changes. No discharge ENT: No sore throat. No nasal congestion. Cardiovascular: Positive for chest pain. Respiratory: Positive for productive cough. Positive for SOB. Gastrointestinal: No abdominal pain.  No nausea, no vomiting.   Musculoskeletal: Negative for back pain. Skin: Negative for rash. Neurological: Negative for headaches, focal weakness or numbness. 10-point ROS otherwise negative.  ____________________________________________   PHYSICAL EXAM:  VITAL SIGNS: ED Triage Vitals  Enc Vitals Group     BP 11/03/15 0722 163/57 mmHg     Pulse Rate 11/03/15 0722 2     Resp 11/03/15 0722 18     Temp 11/03/15 0722 98.4 F (36.9 C)     Temp Source 11/03/15  [id number] Oral     SpO2 11/03/15 0722 96 %     Weight 11/03/15 0722 265 lb  (120.203 kg)     Height 11/03/15 0722 6' (1.829 m)     Head Cir --      Peak Flow --      Pain Score 11/03/15 0723 5     Pain Loc --      Pain Edu? --      Excl. in [place]? --      Constitutional: Alert and oriented. Mildly ill-appearing and in mild distress. Eyes: Conjunctivae are normal. PERRL. EOMI. Head: Atraumatic. ENT:      Ears: EACs and TMs are unremarkable bilaterally. Cerumen is noted bilaterally.      Nose: Mild clear congestion/rhinnorhea.      Mouth/Throat: Mucous membranes are moist. Oropharynx is not erythematous and nonedematous. Uvula is midline. Neck: No stridor.   Hematological/Lymphatic/Immunilogical: No cervical lymphadenopathy. Cardiovascular: Normal rate, regular rhythm. Normal S1 and S2.  Good peripheral circulation. Respiratory: Increased respiratory effort with tachypnea. No retractions. Lungs with mild rhonchi bilateral lower lung fields. Good air entry into the bases. her decreased breath sounds. no wheezing noted. Gastrointestinal: Soft and nontender. No distention. No bruits. No pulsatile mass.. Musculoskeletal: No lower extremity edema. Neurologic:  Normal speech and language. No gross focal neurologic deficits are appreciated.  Skin:  Skin is warm, dry and intact. No rash noted. Psychiatric: Mood and affect are normal. Speech and behavior are normal. Patient exhibits appropriate insight and judgement.   ____________________________________________   LABS (all labs ordered are listed, but only abnormal results are displayed)  Labs Reviewed  COMPREHENSIVE METABOLIC PANEL  CBC WITH DIFFERENTIAL/PLATELET  TROPONIN I   ____________________________________________  EKG   ____________________________________________  RADIOLOGY   No results found.  ____________________________________________    PROCEDURES  Procedure(s) performed:       Medications - No data to display   ____________________________________________   INITIAL  IMPRESSION / ASSESSMENT AND PLAN / ED COURSE  Pertinent labs & imaging results that were available during my care of the patient were reviewed by me and considered in my medical decision making (see chart for details).  Patient presents to emergency department for complaint of a weeklong history of cough. Patient states that he was seen by his primary care provider and started on antibiotics, steroids, albuterol inhaler for symptoms. He states that he has worsened with medication use. Over the last 36 hours he has developed shortness of breath with tachypnea and increased work of breathing. Patient does have a significant cardiac history and is now endorsing chest pain in addition to other symptoms. At this time based on patient's presentation and history patient is moved to the major side of emergency department for closer monitoring with continuous cardiac and pulse ox monitoring. Labs, x-ray, EKG is ordered. Patient care is discussed with Dr. Clearnce Hasten who accepts patient care.     ____________________________________________        Darletta Moll, PA-C 11/03/15 [id number]  Orbie Pyo, MD 11/03/15 [phone]

## 2015-11-04 LAB — STREP PNEUMONIAE URINARY ANTIGEN: STREP PNEUMO URINARY ANTIGEN: NEGATIVE

## 2015-11-04 LAB — GLUCOSE, CAPILLARY
GLUCOSE-CAPILLARY: 231 mg/dL — AB (ref 65–99)
GLUCOSE-CAPILLARY: 132 mg/dL — AB (ref 65–99)
Glucose-Capillary: 202 mg/dL — ABNORMAL HIGH (ref 65–99)
Glucose-Capillary: 175 mg/dL — ABNORMAL HIGH (ref 65–99)
Glucose-Capillary: 204 mg/dL — ABNORMAL HIGH (ref 65–99)

## 2015-11-04 LAB — EXPECTORATED SPUTUM ASSESSMENT W GRAM STAIN, RFLX TO RESP C

## 2015-11-04 LAB — EXPECTORATED SPUTUM ASSESSMENT W REFEX TO RESP CULTURE

## 2015-11-04 LAB — HEMOGLOBIN A1C: HEMOGLOBIN A1C: 6.1 % — AB (ref 4.0–6.0)

## 2015-11-04 MED ORDER — IPRATROPIUM-ALBUTEROL 0.5-2.5 (3) MG/3ML IN SOLN
3.0000 mL | Freq: Four times a day (QID) | RESPIRATORY_TRACT | Status: DC | PRN
  Administered 2015-11-06: 3 mL via RESPIRATORY_TRACT
  Filled 2015-11-04: qty 3

## 2015-11-04 MED ORDER — ATORVASTATIN CALCIUM 20 MG PO TABS
20.0000 mg | ORAL_TABLET | Freq: Every day | ORAL | Status: DC
  Administered 2015-11-04 – 2015-11-05 (×2): 20 mg via ORAL
  Filled 2015-11-04 (×2): qty 1

## 2015-11-04 MED ORDER — INSULIN ASPART 100 UNIT/ML ~~LOC~~ SOLN
0.0000 [IU] | Freq: Three times a day (TID) | SUBCUTANEOUS | Status: DC
  Administered 2015-11-04: 2 [IU] via SUBCUTANEOUS
  Administered 2015-11-05: 3 [IU] via SUBCUTANEOUS
  Administered 2015-11-05 – 2015-11-06 (×3): 2 [IU] via SUBCUTANEOUS
  Administered 2015-11-06: 3 [IU] via SUBCUTANEOUS
  Filled 2015-11-04 (×3): qty 2
  Filled 2015-11-04: qty 3
  Filled 2015-11-04: qty 2
  Filled 2015-11-04: qty 3

## 2015-11-04 NOTE — Progress Notes (Signed)
Inpatient Diabetes Program Recommendations  AACE/ADA: New Consensus Statement on Inpatient Glycemic Control (2015)  Target Ranges:  Prepandial:   less than 140 mg/dL      Peak postprandial:   less than 180 mg/dL (1-2 hours)      Critically ill patients:  140 - 180 mg/dL  Results for Cody Price, Cody Price (MRN [id number]) as of 11/04/2015 09:56  Ref. Range 11/03/2015 16:27 11/03/2015 21:42 11/04/2015 07:27  Glucose-Capillary Latest Ref Range: 65-99 mg/dL 368 (H) 338 (H) 231 (H)   Review of Glycemic Control  Diabetes history: DM2 Outpatient Diabetes medications: Amaryl 4 mg daily Current orders for Inpatient glycemic control: Novolog 0-9 units TID with meals, Novolog 0-5 units QHS  Inpatient Diabetes Program Recommendations: Correction (SSI): Please consider increasing Novolog correction to moderate scale. HgbA1C: A1C in process.  NOTE: Patient received a one time dose of Solumedrol 125 mg on 11/03/15 at 10:40 am which is likely cause of hyperglycemia.  Thanks, Barnie Alderman, RN, MSN, CDE Diabetes Coordinator Inpatient Diabetes Program [phone] (Team Pager from [place] to [place]) [phone] ([place] office) [phone] [facility] office) [phone] [facility] office)

## 2015-11-04 NOTE — Progress Notes (Signed)
[facility] NAME: Cody Price    MR#:  [id number]  DATE OF BIRTH:  September 24, 1940  SUBJECTIVE:  States breathing somewhat improved today however is not back to baseline no fevers chills  REVIEW OF SYSTEMS:  CONSTITUTIONAL: No fever, fatigue or weakness.  EYES: No blurred or double vision.  EARS, NOSE, AND THROAT: No tinnitus or ear pain.  RESPIRATORY: Positive cough, shortness of breath, denies wheezing or hemoptysis.  CARDIOVASCULAR: No chest pain, orthopnea, edema.  GASTROINTESTINAL: No nausea, vomiting, diarrhea or abdominal pain.  GENITOURINARY: No dysuria, hematuria.  ENDOCRINE: No polyuria, nocturia,  HEMATOLOGY: No anemia, easy bruising or bleeding SKIN: No rash or lesion. MUSCULOSKELETAL: No joint pain or arthritis.   NEUROLOGIC: No tingling, numbness, weakness.  PSYCHIATRY: No anxiety or depression.   DRUG ALLERGIES:   Allergies  Allergen Reactions  . Amitriptyline   . Lyrica [Pregabalin]   . Neurontin [Gabapentin]   . Nitroglycerin Other (See Comments)    Makes Blood pressure go to low.   . Novocain [Procaine]   . Procaine Hcl Other (See Comments)    Passes out    VITALS:  Blood pressure 131/60, pulse 91, temperature 98.2 F (36.8 C), temperature source Oral, resp. rate 22, height 6' (1.829 m), weight 118.616 kg (261 lb 8 oz), SpO2 96 %.  PHYSICAL EXAMINATION:  VITAL SIGNS: Filed Vitals:   11/04/15 0612 11/04/15 1235  BP: 126/63 131/60  Pulse: 75 91  Temp: 97.6 F (36.4 C) 98.2 F (36.8 C)  Resp: 24 2   GENERAL:74 y.o.male currently in no acute distress.  HEAD: Normocephalic, atraumatic.  EYES: Pupils equal, round, reactive to light. Extraocular muscles intact. No scleral icterus.  MOUTH: Moist mucosal membrane. Dentition intact. No abscess noted.  EAR, NOSE, THROAT: Clear without exudates. No external lesions.  NECK: Supple. No thyromegaly. No nodules. No JVD.  PULMONARY: Decreased breath  sounds without wheeze rails or rhonci. No use of accessory muscles, poor respiratory effort. Poor air entry bilaterally CHEST: Nontender to palpation.  CARDIOVASCULAR: S1 and S2. Regular rate and rhythm. No murmurs, rubs, or gallops. No edema. Pedal pulses 2+ bilaterally.  GASTROINTESTINAL: Soft, nontender, nondistended. No masses. Positive bowel sounds. No hepatosplenomegaly.  MUSCULOSKELETAL: No swelling, clubbing, or edema. Range of motion full in all extremities.  NEUROLOGIC: Cranial nerves II through XII are intact. No gross focal neurological deficits. Sensation intact. Reflexes intact.  SKIN: No ulceration, lesions, rashes, or cyanosis. Skin warm and dry. Turgor intact.  PSYCHIATRIC: Mood, affect within normal limits. The patient is awake, alert and oriented x 3. Insight, judgment intact.      LABORATORY PANEL:   CBC  Recent Labs Lab 11/03/15 0815  WBC 18.9*  HGB 12.9*  HCT 36.5*  PLT 75*   ------------------------------------------------------------------------------------------------------------------  Chemistries   Recent Labs Lab 11/03/15 0815  NA 135  K 3.6  CL 101  CO2 25  GLUCOSE 190*  BUN 14  CREATININE 0.85  CALCIUM 10.0  AST 25  ALT 14*  ALKPHOS 44  BILITOT 1.0   ------------------------------------------------------------------------------------------------------------------  Cardiac Enzymes  Recent Labs Lab 11/03/15 0815  TROPONINI 0.03   ------------------------------------------------------------------------------------------------------------------  RADIOLOGY:  Dg Chest 2 View  11/03/2015  CLINICAL DATA:  Cough. EXAM: CHEST  2 VIEW COMPARISON:  08/25/2014. FINDINGS: Mediastinum and hilar structures are normal. Borderline cardiomegaly. Left lower lobe infiltrate most consistent with pneumonia. No pleural effusion or pneumothorax . IMPRESSION: 1. Left lower lobe infiltrate consistent with pneumonia. 2. Borderline cardiomegaly.  Electronically Signed   By: Marcello Moores  Register   On: 11/03/2015 08:10    EKG:   Orders placed or performed during the hospital encounter of 11/03/15  . ED EKG  . ED EKG    ASSESSMENT AND PLAN:   75 year old Caucasian gentleman with history of type 2 diabetes non-insulin-requiring presenting with shortness of breath cough admitted 11/03/15  1. Sepsis present on arrival: Secondary to community acquired pneumonia, follow culture data continue azithromycin and ceftriaxone continue oxygen, breathing treatments as required 2. Essential hypertension: Lisinopril 3. Type 2 diabetes non-insulin-requiring poorly controlled: Hold oral agents at insulin sliding scale for increased coverage today given poor control 4. GERD without esophagitis: PPI therapy 5. Hyperlipidemia unspecified statin therapy 6. Venous thromboembolism prophylactic: Lovenox     All the records are reviewed and case discussed with Care Management/Social Workerr. Management plans discussed with the patient, family and they are in agreement.  CODE STATUS: Full  TOTAL TIME TAKING CARE OF THIS PATIENT: 28 minutes.   POSSIBLE D/C IN 1-2 DAYS, DEPENDING ON CLINICAL CONDITION.  Disposition: Physical therapy  Trysten Bernard,  Karenann Cai.D on 11/04/2015 at 2:13 PM  Between 7am to 6pm - Pager - [phone]  After 6pm: House Pager: - [facility]  Office  [phone]  CC: Primary care physician; Madelyn Brunner, MD

## 2015-11-04 NOTE — Progress Notes (Signed)
Pharmacist - Prescriber Communication  Per [place], the order for simvastatin 40 mg has been changed to atorvastatin 20 mg mg to avoid drug-drug interaction with diltiazem. Please call pharmacy if you have any questions about this substitution.  Laural Benes, Pharm.D., BCPS Clinical Pharmacist 11/04/2015 [phone]

## 2015-11-04 NOTE — Evaluation (Signed)
Physical Therapy Evaluation Patient Details Name: Cody Price MRN: [id number] DOB: 1941/03/19 Today's Date: 11/04/2015   History of Present Illness  presented to ER seconadry to cough, SOB and chest tightness; admitted with L LL PNA.  Clinical Impression  Upon evaluation, patient alert and oriented; follows commands and demonstrates good insight/safety awareness.  Bilat UE/LE strength and ROM grossly WFL and symmetrical; reports baseline paresthesia bilat knees distally (unchanged with this admission).  Able to complete bed mobility with mod indep; sit/stand, basic transfers and gait (250') with RW, cga/close sup.  Performs mild dynamic gait components without buckling, LOB or safety concern.  Gait speed [facility] for age/genger (10' walk time, 6 seconds). Vitals stable and WFL (sats mid-90s; HR 80-90s) without reports of chest tightness/pain; BORG 8-9/10. Would benefit from skilled PT to promote mobility and functional endurance during remaining hospitalization; anticipate no skilled PT needs upon discharge.      Follow Up Recommendations No PT follow up (anticipate no PT needs upon discharge)    Equipment Recommendations       Recommendations for Other Services       Precautions / Restrictions Precautions Precautions: Fall Restrictions Weight Bearing Restrictions: No      Mobility  Bed Mobility Overal bed mobility: Modified Independent                Transfers Overall transfer level: Needs assistance Equipment used: Rolling walker (2 wheeled) Transfers: Sit to/from Stand Sit to Stand: Supervision         General transfer comment: requires UE assist to complete  Ambulation/Gait Ambulation/Gait assistance: Min guard;Supervision Ambulation Distance (Feet): 250 Feet Assistive device: Rolling walker (2 wheeled)   Gait velocity: 10' walk time, 6 seconds Gait velocity interpretation: at or above normal speed for age/gender General Gait Details: reciprocal stepping  with fair step height/length; fair cadence and gait speed.  Able to complete mild dynamic gait components without buckling, LOB or safety concern.  Patient does prefer use of RW at this time due to weakness  Stairs            Wheelchair Mobility    Modified Rankin (Stroke Patients Only)       Balance Overall balance assessment: Needs assistance Sitting-balance support: No upper extremity supported;Feet supported Sitting balance-Leahy Scale: Good     Standing balance support: Bilateral upper extremity supported Standing balance-Leahy Scale: Fair                               Pertinent Vitals/Pain Pain Assessment: 0-10 Pain Score: 4  Pain Location: back Pain Descriptors / Indicators: Aching;Sore (chronic) Pain Intervention(s): Limited activity within patient's tolerance;Monitored during session;Repositioned    Home Living Family/patient expects to be discharged to:: Private residence Living Arrangements: Alone   Type of Home: House Home Access: Stairs to enter Entrance Stairs-Rails: None Entrance Stairs-Number of Steps: 1 Home Layout: One level Home Equipment: Walker - 2 wheels      Prior Function Level of Independence: Independent         Comments: Indep with ADLs, household activities; denies fall history.     Hand Dominance        Extremity/Trunk Assessment   Upper Extremity Assessment: Overall WFL for tasks assessed           Lower Extremity Assessment: Overall WFL for tasks assessed (grossly 4+ to 5/5 throughout; does report baseline paresthesia bilat knees distally (with near-absent sensation plantar surface of bilat feet)  due to neuropathy)         Communication   Communication: No difficulties  Cognition Arousal/Alertness: Awake/alert Behavior During Therapy: WFL for tasks assessed/performed Overall Cognitive Status: Within Functional Limits for tasks assessed                      General Comments       Exercises        Assessment/Plan    PT Assessment Patient needs continued PT services  PT Diagnosis Difficulty walking;Generalized weakness   PT Problem List Cardiopulmonary status limiting activity;Decreased activity tolerance  PT Treatment Interventions DME instruction;Gait training;Stair training;Functional mobility training;Therapeutic activities;Therapeutic exercise;Balance training;Patient/family education   PT Goals (Current goals can be found in the Care Plan section) Acute Rehab PT Goals Patient Stated Goal: to return home PT Goal Formulation: With patient Time For Goal Achievement: 11/18/15 Potential to Achieve Goals: Good    Frequency Min 2X/week   Barriers to discharge Decreased caregiver support      Co-evaluation               End of Session Equipment Utilized During Treatment: Gait belt Activity Tolerance: Patient tolerated treatment well Patient left: in bed;with call bell/phone within reach;with bed alarm set Nurse Communication: Mobility status         Time: 1414-1431 PT Time Calculation (min) (ACUTE ONLY): 17 min   Charges:   PT Evaluation $PT Eval Low Complexity: 1 Procedure     PT G Codes:        Rica Heather H. Owens Shark, PT, DPT, NCS 11/04/2015, 3:30 PM [phone]

## 2015-11-05 LAB — GLUCOSE, CAPILLARY
Glucose-Capillary: 156 mg/dL — ABNORMAL HIGH (ref 65–99)
Glucose-Capillary: 150 mg/dL — ABNORMAL HIGH (ref 65–99)
Glucose-Capillary: 203 mg/dL — ABNORMAL HIGH (ref 65–99)

## 2015-11-05 MED ORDER — DOCUSATE SODIUM 100 MG PO CAPS
100.0000 mg | ORAL_CAPSULE | Freq: Two times a day (BID) | ORAL | Status: DC
  Administered 2015-11-05 – 2015-11-06 (×3): 100 mg via ORAL
  Filled 2015-11-05 (×3): qty 1

## 2015-11-05 MED ORDER — AZITHROMYCIN 250 MG PO TABS
500.0000 mg | ORAL_TABLET | Freq: Every day | ORAL | Status: DC
  Administered 2015-11-06: 500 mg via ORAL
  Filled 2015-11-05: qty 2

## 2015-11-05 MED ORDER — POLYETHYLENE GLYCOL 3350 17 G PO PACK
17.0000 g | PACK | Freq: Every day | ORAL | Status: DC | PRN
  Administered 2015-11-05: 17 g via ORAL
  Filled 2015-11-05: qty 1

## 2015-11-05 MED ORDER — ALUM & MAG HYDROXIDE-SIMETH 200-200-20 MG/5ML PO SUSP: 30.0000 mL | ORAL | Status: DC | PRN

## 2015-11-05 MED ORDER — GUAIFENESIN-CODEINE 100-10 MG/5ML PO SOLN
10.0000 mL | ORAL | Status: DC | PRN
  Administered 2015-11-05 – 2015-11-06 (×2): 10 mL via ORAL
  Filled 2015-11-05 (×2): qty 10

## 2015-11-05 MED ORDER — CEFUROXIME AXETIL 250 MG/5ML PO SUSR
250.0000 mg | Freq: Two times a day (BID) | ORAL | Status: DC
  Administered 2015-11-05 – 2015-11-06 (×2): 250 mg via ORAL
  Filled 2015-11-05 (×3): qty 5

## 2015-11-05 NOTE — Progress Notes (Addendum)
[facility] at [place] NAME: Cody Price    MR#:  [id number]  DATE OF BIRTH:  1940/12/23  SUBJECTIVE:  States feels poorly today Complains of worsening cough  REVIEW OF SYSTEMS:  CONSTITUTIONAL: No fever, fatigue or weakness.  EYES: No blurred or double vision.  EARS, NOSE, AND THROAT: No tinnitus or ear pain.  RESPIRATORY: Positive cough, shortness of breath, denies wheezing or hemoptysis.  CARDIOVASCULAR: No chest pain, orthopnea, edema.  GASTROINTESTINAL: No nausea, vomiting, diarrhea or abdominal pain.  GENITOURINARY: No dysuria, hematuria.  ENDOCRINE: No polyuria, nocturia,  HEMATOLOGY: No anemia, easy bruising or bleeding SKIN: No rash or lesion. MUSCULOSKELETAL: No joint pain or arthritis.   NEUROLOGIC: No tingling, numbness, weakness.  PSYCHIATRY: No anxiety or depression.   DRUG ALLERGIES:   Allergies  Allergen Reactions  . Amitriptyline   . Lyrica [Pregabalin]   . Neurontin [Gabapentin]   . Nitroglycerin Other (See Comments)    Makes Blood pressure go to low.   . Novocain [Procaine]   . Procaine Hcl Other (See Comments)    Passes out    VITALS:  Blood pressure 131/66, pulse 66, temperature 98 F (36.7 C), temperature source Oral, resp. rate 19, height 6' (1.829 m), weight 118.616 kg (261 lb 8 oz), SpO2 97 %.  PHYSICAL EXAMINATION:  VITAL SIGNS: Filed Vitals:   11/05/15 0504 11/05/15 1350  BP: 124/65 131/66  Pulse: 72 66  Temp: 97.9 F (36.6 C) 98 F (36.7 C)  Resp: 61 19   GENERAL:75 y.o.male currently in no acute distress.  HEAD: Normocephalic, atraumatic.  EYES: Pupils equal, round, reactive to light. Extraocular muscles intact. No scleral icterus.  MOUTH: Moist mucosal membrane. Dentition intact. No abscess noted.  EAR, NOSE, THROAT: Clear without exudates. No external lesions.  NECK: Supple. No thyromegaly. No nodules. No JVD.  PULMONARY: Decreased breath sounds without wheeze rails or rhonci.  No use of accessory muscles, poor respiratory effort. Poor air entry bilaterally CHEST: Nontender to palpation.  CARDIOVASCULAR: S1 and S2. Regular rate and rhythm. No murmurs, rubs, or gallops. No edema. Pedal pulses 2+ bilaterally.  GASTROINTESTINAL: Soft, nontender, nondistended. No masses. Positive bowel sounds. No hepatosplenomegaly.  MUSCULOSKELETAL: No swelling, clubbing, or edema. Range of motion full in all extremities.  NEUROLOGIC: Cranial nerves II through XII are intact. No gross focal neurological deficits. Sensation intact. Reflexes intact.  SKIN: No ulceration, lesions, rashes, or cyanosis. Skin warm and dry. Turgor intact.  PSYCHIATRIC: Mood, affect within normal limits. The patient is awake, alert and oriented x 3. Insight, judgment intact.      LABORATORY PANEL:   CBC  Recent Labs Lab 11/03/15 0815  WBC 18.9*  HGB 12.9*  HCT 36.5*  PLT 75*   ------------------------------------------------------------------------------------------------------------------  Chemistries   Recent Labs Lab 11/03/15 0815  NA 135  K 3.6  CL 101  CO2 25  GLUCOSE 190*  BUN 14  CREATININE 0.85  CALCIUM 10.0  AST 25  ALT 14*  ALKPHOS 44  BILITOT 1.0   ------------------------------------------------------------------------------------------------------------------  Cardiac Enzymes  Recent Labs Lab 11/03/15 0815  TROPONINI 0.03   ------------------------------------------------------------------------------------------------------------------  RADIOLOGY:  No results found.  EKG:   Orders placed or performed during the hospital encounter of 11/03/15  . ED EKG  . ED EKG    ASSESSMENT AND PLAN:   75 year old Caucasian gentleman with history of type 2 diabetes non-insulin-requiring presenting with shortness of breath cough admitted 11/03/15  1. Sepsis present on arrival: Secondary to community  acquired pneumonia, follow culture data continue azithromycin and  ceftriaxone continue oxygen, breathing treatments as required-change antibiotics to oral 2. Essential hypertension: Lisinopril 3. Type 2 diabetes non-insulin-requiring poorly controlled: Hold oral agents at insulin sliding scale for increased coverage today given poor control 4. GERD without esophagitis: PPI therapy 5. Hyperlipidemia unspecified statin therapy 6. Venous thromboembolism prophylactic: Lovenox  Disposition: Home without needs   All the records are reviewed and case discussed with Care Management/Social Workerr. Management plans discussed with the patient, family and they are in agreement.  CODE STATUS: Full  TOTAL TIME TAKING CARE OF THIS PATIENT: 28 minutes.   POSSIBLE D/C IN 1 DAYS, DEPENDING ON CLINICAL CONDITION.  Disposition: Physical therapy  Hower,  Karenann Cai.D on 11/05/2015 at 2:37 PM  Between 7am to 6pm - Pager - [phone]  After 6pm: House Pager: - [facility]  Office  [phone]  CC: Primary care physician; Madelyn Brunner, MD

## 2015-11-05 NOTE — Care Management Important Message (Signed)
Important Message  Patient Details  Name: MADYX BLINDER MRN: [id number] Date of Birth: [birth year]   Medicare Important Message Given:  Yes    Juliann Pulse A Kenzlee Fishburn 11/05/2015, 10:15 AM

## 2015-11-05 NOTE — Progress Notes (Signed)
Initial Nutrition Assessment   INTERVENTION:   Meals and Snacks: Cater to patient preferences on Heart/Carb Modified diet order Medical Food Supplement Therapy: pt may benefit on follow if intake inadequate   NUTRITION DIAGNOSIS:   Inadequate oral intake related to poor appetite as evidenced by per patient/family report.  GOAL:   Patient will meet greater than or equal to 90% of their needs  MONITOR:    (Energy Intake, Glucose Profile, Anthropometrics, Digestive system)  REASON FOR ASSESSMENT:   Malnutrition Screening Tool    ASSESSMENT:   Pt admitted with sepsis secondary to pna.  Past Medical History  Diagnosis Date  . CAD (coronary artery disease) 2010    stent  . Ischemic heart disease 1991    with angioplasty  . HLD (hyperlipidemia)   . Spinal stenosis   . Pernicious anemia   . Peripheral neuropathy ([place])   . Elevated PSA   . Pulmonary embolism ([place])     previous  . Morbid obesity ([place])   . Kidney stone   . Diabetes mellitus   . Hypertension   . Myocardial infarction ([place])   . Sleep apnea     pt. denies sleep apnea, pt. states he has had 2 studies - last one 2011  . Pneumonia 1995    /w PE,   . H/O CT scan     recent renal CT due to hematuria, cystoscopy - normal    . GERD (gastroesophageal reflux disease)   . History of hiatal hernia   . Arthritis     degenerative back   . Renal cyst   . ED (erectile dysfunction)   . Hematuria   . BPH with obstruction/lower urinary tract symptoms   . History of kidney stones     Diet Order:  Diet heart healthy/carb modified Room service appropriate?: Yes; Fluid consistency:: Thin    Current Nutrition: Pt reports eating eggs and some toast this am. Pt reports not having much of an appetite this am, however also asking for meat servings on trays. Recorded po intake 75-100% of meals since admission.   Food/Nutrition-Related History: Pt reports poor appetite for the past couple of weeks. Pt reports still trying  to eat but not as much as usual. Pt reports eating applesauce, chicken noodle soups, and lighter foods.    Scheduled Medications:  . atorvastatin  20 mg Oral q1800  . azithromycin  500 mg Intravenous Q24H  . cefTRIAXone (ROCEPHIN)  IV  1 g Intravenous Q24H  . clopidogrel  75 mg Oral Daily  . diltiazem  180 mg Oral QAC breakfast  . docusate sodium  100 mg Oral BID  . enoxaparin (LOVENOX) injection  40 mg Subcutaneous Q24H  . hydrochlorothiazide  25 mg Oral Daily  . insulin aspart  0-15 Units Subcutaneous TID WC  . lisinopril  10 mg Oral Daily  . pantoprazole  40 mg Oral Daily    Continuous Medications:  . sodium chloride 75 mL/hr at 11/05/15 0646     Electrolyte/Renal Profile and Glucose Profile:   Recent Labs Lab 11/03/15 0815  NA 135  K 3.6  CL 101  CO2 25  BUN 14  CREATININE 0.85  CALCIUM 10.0  GLUCOSE 190*   Protein Profile:  Recent Labs Lab 11/03/15 0815  ALBUMIN 4.1    Gastrointestinal Profile: Last BM:  11/01/2015   Nutrition-Focused Physical Exam Findings: Nutrition-Focused physical exam completed. Findings are WDL for fat depletion, muscle depletion, and edema.    Weight Change: Pt reports weight  loss of 3lbs from UBW of 266lbs when admitted. Pr CHL weight encounters pt weight loss of 2.6% in one month.    Height:   Ht Readings from Last 1 Encounters:  11/03/15 6' (1.829 m)    Weight:   Wt Readings from Last 1 Encounters:  11/03/15 261 lb 8 oz (118.616 kg)   Wt Readings from Last 10 Encounters:  11/03/15 261 lb 8 oz (118.616 kg)  09/30/15 268 lb 4.8 oz (121.7 kg)  09/23/15 265 lb 12 oz (120.543 kg)  03/17/15 271 lb 8 oz (123.152 kg)  09/16/14 267 lb 8 oz (121.337 kg)  08/23/14 263 lb 8 oz (119.523 kg)  07/08/14 267 lb (121.11 kg)  06/21/14 274 lb 4 oz (124.399 kg)  06/21/13 264 lb 4 oz (119.863 kg)  06/20/12 280 lb (127.007 kg)    Ideal Body Weight:   80.9kg  BMI:  Body mass index is 35.46 kg/(m^2).  Estimated Nutritional Needs:    Kcal:  BEE: 1587kcals, TEE: (IF 1.1-1.3)(AF 1.2) [id number], using IBW of 80.9kg  Protein:  89-105g protein (1.1-1.3g/kg)  Fluid:  2023-2436mL of fluid (25-54mL/kg)  EDUCATION NEEDS:   No education needs identified at this time   [place], RD, LDN Pager [phone] Weekend/On-Call Pager [phone]

## 2015-11-05 NOTE — Progress Notes (Signed)
Educated patient about needing the bed alarm on due to his history of falls but he refused.

## 2015-11-06 ENCOUNTER — Inpatient Hospital Stay: Payer: Medicare Other

## 2015-11-06 LAB — GLUCOSE, CAPILLARY
Glucose-Capillary: 142 mg/dL — ABNORMAL HIGH (ref 65–99)
Glucose-Capillary: 193 mg/dL — ABNORMAL HIGH (ref 65–99)

## 2015-11-06 MED ORDER — AZITHROMYCIN 250 MG PO TABS: 250.0000 mg | ORAL_TABLET | Freq: Every day | ORAL | Status: DC

## 2015-11-06 MED ORDER — PREDNISONE 10 MG (21) PO TBPK: 10.0000 mg | ORAL_TABLET | Freq: Every day | ORAL | Status: DC

## 2015-11-06 MED ORDER — METHYLPREDNISOLONE SODIUM SUCC 125 MG IJ SOLR
60.0000 mg | INTRAMUSCULAR | Status: DC
  Administered 2015-11-06: 60 mg via INTRAVENOUS
  Filled 2015-11-06: qty 2

## 2015-11-06 MED ORDER — GUAIFENESIN-CODEINE 100-10 MG/5ML PO SOLN: 10.0000 mL | ORAL | Status: DC | PRN

## 2015-11-06 MED ORDER — CEFUROXIME AXETIL 250 MG PO TABS: 250.0000 mg | ORAL_TABLET | Freq: Two times a day (BID) | ORAL | Status: DC

## 2015-11-06 MED ORDER — FUROSEMIDE 10 MG/ML IJ SOLN
40.0000 mg | Freq: Once | INTRAMUSCULAR | Status: AC
  Administered 2015-11-06: 40 mg via INTRAVENOUS
  Filled 2015-11-06: qty 4

## 2015-11-06 NOTE — Progress Notes (Signed)
11/06/2015  SATURATION QUALIFICATIONS: (This note is used to comply with regulatory documentation for home oxygen)  Patient Saturations on Room Air at Rest = 95%  Patient Saturations on Room Air while Ambulating = 84%  Patient Saturations on 0 Liters of oxygen while Ambulating = 84%  Please briefly explain why patient needs home oxygen: Pt desaturates to 84% while ambulating  Dola Argyle, RN

## 2015-11-06 NOTE — Progress Notes (Signed)
SATURATION QUALIFICATIONS: (This note is used to comply with regulatory documentation for home oxygen)  Patient Saturations on Room Air at Rest = 95%  Patient Saturations on Room Air while Ambulating = 86%  Patient Saturations on 1 Liters of oxygen while Ambulating = 94%  Please briefly explain why patient needs home oxygen: Pt desaturates when ambulating on room air.  Dola Argyle, RN

## 2015-11-06 NOTE — Discharge Summary (Addendum)
[facility] [place] NAME: Cody Price    MR#:  [id number]  DATE OF BIRTH:  1940/09/22  DATE OF ADMISSION:  11/03/2015 ADMITTING PHYSICIAN: Demetrios Loll, MD  DATE OF DISCHARGE: No discharge date for patient encounter.  PRIMARY CARE PHYSICIAN: Madelyn Brunner, MD    ADMISSION DIAGNOSIS:  Wheezing [R06.2] CAP (community acquired pneumonia) [J18.9]  DISCHARGE DIAGNOSIS:  Acute hypoxic respiratory failure secondary to community-acquired pneumonia  SECONDARY DIAGNOSIS:   Past Medical History  Diagnosis Date  . CAD (coronary artery disease) 2010    stent  . Ischemic heart disease 1991    with angioplasty  . HLD (hyperlipidemia)   . Spinal stenosis   . Pernicious anemia   . Peripheral neuropathy ([place])   . Elevated PSA   . Pulmonary embolism ([place])     previous  . Morbid obesity ([place])   . Kidney stone   . Diabetes mellitus   . Hypertension   . Myocardial infarction ([place])   . Sleep apnea     pt. denies sleep apnea, pt. states he has had 2 studies - last one 2011  . Pneumonia 1995    /w PE,   . H/O CT scan     recent renal CT due to hematuria, cystoscopy - normal    . GERD (gastroesophageal reflux disease)   . History of hiatal hernia   . Arthritis     degenerative back   . Renal cyst   . ED (erectile dysfunction)   . Hematuria   . BPH with obstruction/lower urinary tract symptoms   . History of kidney stones     HOSPITAL COURSE:  Cody Price  is a 75 y.o. male admitted 11/03/2015 with chief complaint cough, shortness of breath, generalized weakness. Please see H&P performed by Dr. Bridgett Larsson for further information. He was noted to have pneumonia on chest x-ray upon arrival to the hospital. Started on antibiotics with ceftriaxone and azithromycin, breathing treatments, steroids with some improvement. He has no further desaturations at rest however on ambulation he desaturates into the mid 80s requiring supplemental oxygen.   DISCHARGE CONDITIONS:   Stable  CONSULTS OBTAINED:     DRUG ALLERGIES:   Allergies  Allergen Reactions  . Amitriptyline   . Lyrica [Pregabalin]   . Neurontin [Gabapentin]   . Nitroglycerin Other (See Comments)    Makes Blood pressure go to low.   . Novocain [Procaine]   . Procaine Hcl Other (See Comments)    Passes out    DISCHARGE MEDICATIONS:   Current Discharge Medication List    START taking these medications   Details  azithromycin (ZITHROMAX) 250 MG tablet Take 1 tablet (250 mg total) by mouth daily. Qty: 6 each, Refills: 0    cefUROXime (CEFTIN) 250 MG tablet Take 1 tablet (250 mg total) by mouth 2 (two) times daily with a meal. Qty: 8 tablet, Refills: 0    guaiFENesin-codeine 100-10 MG/5ML syrup Take 10 mLs by mouth every 4 (four) hours as needed for cough. Qty: 118 mL, Refills: 0    predniSONE (STERAPRED UNI-PAK 21 TAB) 10 MG (21) TBPK tablet Take 1 tablet (10 mg total) by mouth daily. 40mg  oral 1 day, then 20mg  oral for 2 days, then 10mg  oral 2 days, then stop Qty: 10 tablet, Refills: 0      CONTINUE these medications which have NOT CHANGED   Details  albuterol (PROAIR HFA) 108 (90 Base) MCG/ACT inhaler Inhale 1-2 puffs  into the lungs every 6 (six) hours as needed.    Cholecalciferol (VITAMIN D) 2000 UNITS CAPS Take by mouth daily.      clopidogrel (PLAVIX) 75 MG tablet TAKE ONE TABLET BY MOUTH ONCE DAILY Qty: 90 tablet, Refills: 2    Coenzyme Q10 (CO Q-10 PO) Take 1 capsule by mouth daily.     Cyanocobalamin (VITAMIN B-12 IJ) Inject 1 vial into the muscle every 30 (thirty) days.     diazepam (VALIUM) 5 MG tablet Take 5 mg by mouth every 12 (twelve) hours as needed for muscle spasms.     diltiazem (CARDIZEM CD) 180 MG 24 hr capsule Take 180 mg by mouth daily before breakfast.     doxycycline (VIBRA-TABS) 100 MG tablet Take 1 tablet by mouth 2 (two) times daily.    glimepiride (AMARYL) 4 MG tablet Take 4 mg by mouth daily. Sometimes pt. Takes this  med. Twice daily    hydrochlorothiazide 25 MG tablet Take 25 mg by mouth daily.      HYDROcodone-homatropine (HYCODAN) 5-1.5 MG/5ML syrup Take 5 mLs by mouth every 6 (six) hours as needed.    lisinopril (PRINIVIL,ZESTRIL) 10 MG tablet TAKE ONE TABLET BY MOUTH ONCE DAILY Qty: 90 tablet, Refills: 2    Omega-3 Fatty Acids (TH FISH OIL EXTRA STRENGTH) 1200 MG CAPS Take 1 tablet by mouth daily.     omeprazole (PRILOSEC) 20 MG capsule Take 20 mg by mouth 2 (two) times daily.     oxyCODONE-acetaminophen (PERCOCET) 5-325 MG per tablet Take 2 tablets by mouth every 4 (four) hours as needed for moderate pain.     sildenafil (VIAGRA) 100 MG tablet Take 100 mg by mouth daily as needed for erectile dysfunction.     simvastatin (ZOCOR) 40 MG tablet Take 40 mg by mouth daily before breakfast.     testosterone cypionate (DEPOTESTOSTERONE CYPIONATE) 200 MG/ML injection every 30 (thirty) days.      STOP taking these medications     predniSONE (DELTASONE) 10 MG tablet          DISCHARGE INSTRUCTIONS:    DIET:  Diabetic diet  DISCHARGE CONDITION:  Stable  ACTIVITY:  Activity as tolerated  OXYGEN:  Home Oxygen: Yes.     Oxygen Delivery: 2 liters/min via Patient connected to nasal cannula oxygen  DISCHARGE LOCATION:  home   If you experience worsening of your admission symptoms, develop shortness of breath, life threatening emergency, suicidal or homicidal thoughts you must seek medical attention immediately by calling 911 or calling your MD immediately  if symptoms less severe.  You Must read complete instructions/literature along with all the possible adverse reactions/side effects for all the Medicines you take and that have been prescribed to you. Take any new Medicines after you have completely understood and accpet all the possible adverse reactions/side effects.   Please note  You were cared for by a hospitalist during your hospital stay. If you have any questions about your  discharge medications or the care you received while you were in the hospital after you are discharged, you can call the unit and asked to speak with the hospitalist on call if the hospitalist that took care of you is not available. Once you are discharged, your primary care physician will handle any further medical issues. Please note that NO REFILLS for any discharge medications will be authorized once you are discharged, as it is imperative that you return to your primary care physician (or establish a relationship with a primary care  physician if you do not have one) for your aftercare needs so that they can reassess your need for medications and monitor your lab values.    On the day of Discharge:   VITAL SIGNS:  Blood pressure 122/67, pulse 66, temperature 98.4 F (36.9 C), temperature source Oral, resp. rate 18, height 6' (1.829 m), weight 261 lb 8 oz (118.616 kg), SpO2 95 %.  I/O:   Intake/Output Summary (Last 24 hours) at 11/06/15 1434 Last data filed at 11/06/15 0826  Gross per 24 hour  Intake   2597 ml  Output    650 ml  Net   1947 ml    PHYSICAL EXAMINATION:  GENERAL:  75 y.o.-year-old patient lying in the bed with no acute distress.  EYES: Pupils equal, round, reactive to light and accommodation. No scleral icterus. Extraocular muscles intact.  HEENT: Head atraumatic, normocephalic. Oropharynx and nasopharynx clear.  NECK:  Supple, no jugular venous distention. No thyroid enlargement, no tenderness.  LUNGS: Normal breath sounds bilaterally, no wheezing, scant rhonchi . No use of accessory muscles of respiration.  CARDIOVASCULAR: S1, S2 normal. No murmurs, rubs, or gallops.  ABDOMEN: Soft, non-tender, non-distended. Bowel sounds present. No organomegaly or mass.  EXTREMITIES: No pedal edema, cyanosis, or clubbing.  NEUROLOGIC: Cranial nerves II through XII are intact. Muscle strength 5/5 in all extremities. Sensation intact. Gait not checked.  PSYCHIATRIC: The patient is  alert and oriented x 3.  SKIN: No obvious rash, lesion, or ulcer.   DATA REVIEW:   CBC  Recent Labs Lab 11/03/15 0815  WBC 18.9*  HGB 12.9*  HCT 36.5*  PLT 75*    Chemistries   Recent Labs Lab 11/03/15 0815  NA 135  K 3.6  CL 101  CO2 25  GLUCOSE 190*  BUN 14  CREATININE 0.85  CALCIUM 10.0  AST 25  ALT 14*  ALKPHOS 44  BILITOT 1.0    Cardiac Enzymes  Recent Labs Lab 11/03/15 0815  TROPONINI 0.03    Microbiology Results  Results for orders placed or performed during the hospital encounter of 11/03/15  Culture, blood (routine x 2) Call MD if unable to obtain prior to antibiotics being given     Status: None (Preliminary result)   Collection Time: 11/03/15  1:21 PM  Result Value Ref Range Status   Specimen Description BLOOD RIGHT ASSIST CONTROL  Final   Special Requests BOTTLES DRAWN AEROBIC AND ANAEROBIC  3CC  Final   Culture NO GROWTH 2 DAYS  Final   Report Status PENDING  Incomplete  Culture, blood (routine x 2) Call MD if unable to obtain prior to antibiotics being given     Status: None (Preliminary result)   Collection Time: 11/03/15  1:29 PM  Result Value Ref Range Status   Specimen Description BLOOD LEFT ASSIST CONTROL  Final   Special Requests BOTTLES DRAWN AEROBIC AND ANAEROBIC 4CC  Final   Culture NO GROWTH 2 DAYS  Final   Report Status PENDING  Incomplete  Culture, sputum-assessment     Status: None   Collection Time: 11/03/15  4:39 PM  Result Value Ref Range Status   Specimen Description SPUTUM  Final   Special Requests NONE  Final   Sputum evaluation   Final    Sputum specimen not acceptable for testing.  Please recollect.   SPOKE WITH ANITA HARRIS AT 1817 11/03/15 SDR    Report Status 11/04/2015 FINAL  Final    RADIOLOGY:  Dg Chest Port 1 View  11/06/2015  CLINICAL DATA:  Worsening cough, shortness of Breath EXAM: PORTABLE CHEST 1 VIEW COMPARISON:  11/03/2015 FINDINGS: Cardiomediastinal silhouette is stable. Persistent streaky  infiltrate/ pneumonia in left lower lobe. No pulmonary edema. No pleural effusion. Bony thorax is stable. IMPRESSION: Persistent streaky infiltrate/ pneumonia in left lower lobe. No pulmonary edema. Electronically Signed   By: Lahoma Crocker M.D.   On: 11/06/2015 12:53     Management plans discussed with the patient, family and they are in agreement.  CODE STATUS:     Code Status Orders        Start     Ordered   11/03/15 1307  Full code   Continuous     11/03/15 1306    Code Status History    Date Active Date Inactive Code Status Order ID Comments User Context   This patient has a current code status but no historical code status.      TOTAL TIME TAKING CARE OF THIS PATIENT: 28 minutes.    Hower,  Karenann Cai.D on 11/06/2015 at 2:34 PM  Between 7am to 6pm - Pager - [phone]  After 6pm go to [email or web address] - password [email or web address] [facility]  Office  [phone]  CC: Primary care physician; Madelyn Brunner, MD

## 2015-11-06 NOTE — Care Management (Signed)
Plan for patient to discharge to home today.  Patient oxygen saturation was 84% with ambulation.  MD has order home O2.  Patient will need to be set up with home O2 prior to discharge.  Will from [facility] notified of referral .  Awaiting documentation of qualifying saturations.  O2 to be delivered to room prior to discharge.  RNCM signing off

## 2015-11-06 NOTE — Progress Notes (Signed)
11/06/2015 4:35 PM  Cody Price to be D/C'd Home per MD order.  Discussed prescriptions and follow up appointments with the patient. Prescriptions given to patient, medication list explained in detail. Pt verbalized understanding.    Medication List    STOP taking these medications        predniSONE 10 MG tablet  Commonly known as:  DELTASONE  Replaced by:  predniSONE 10 MG (21) Tbpk tablet      TAKE these medications        azithromycin 250 MG tablet  Commonly known as:  ZITHROMAX  Take 1 tablet (250 mg total) by mouth daily.     cefUROXime 250 MG tablet  Commonly known as:  CEFTIN  Take 1 tablet (250 mg total) by mouth 2 (two) times daily with a meal.     clopidogrel 75 MG tablet  Commonly known as:  PLAVIX  TAKE ONE TABLET BY MOUTH ONCE DAILY     CO Q-10 PO  Take 1 capsule by mouth daily.     diltiazem 180 MG 24 hr capsule  Commonly known as:  CARDIZEM CD  Take 180 mg by mouth daily before breakfast.     doxycycline 100 MG tablet  Commonly known as:  VIBRA-TABS  Take 1 tablet by mouth 2 (two) times daily.     glimepiride 4 MG tablet  Commonly known as:  AMARYL  Take 4 mg by mouth daily. Sometimes pt. Takes this med. Twice daily     guaiFENesin-codeine 100-10 MG/5ML syrup  Take 10 mLs by mouth every 4 (four) hours as needed for cough.     hydrochlorothiazide 25 MG tablet  Commonly known as:  HYDRODIURIL  Take 25 mg by mouth daily.     HYDROcodone-homatropine 5-1.5 MG/5ML syrup  Commonly known as:  HYCODAN  Take 5 mLs by mouth every 6 (six) hours as needed.     lisinopril 10 MG tablet  Commonly known as:  PRINIVIL,ZESTRIL  TAKE ONE TABLET BY MOUTH ONCE DAILY     omeprazole 20 MG capsule  Commonly known as:  PRILOSEC  Take 20 mg by mouth 2 (two) times daily.     PERCOCET 5-325 MG tablet  Generic drug:  oxyCODONE-acetaminophen  Take 2 tablets by mouth every 4 (four) hours as needed for moderate pain.     predniSONE 10 MG (21) Tbpk tablet  Commonly  known as:  STERAPRED UNI-PAK 21 TAB  Take 1 tablet (10 mg total) by mouth daily. 40mg  oral 1 day, then 20mg  oral for 2 days, then 10mg  oral 2 days, then stop     PROAIR HFA 108 (90 Base) MCG/ACT inhaler  Generic drug:  albuterol  Inhale 1-2 puffs into the lungs every 6 (six) hours as needed.     simvastatin 40 MG tablet  Commonly known as:  ZOCOR  Take 40 mg by mouth daily before breakfast.     testosterone cypionate 200 MG/ML injection  Commonly known as:  DEPOTESTOSTERONE CYPIONATE  every 30 (thirty) days.     TH FISH OIL EXTRA STRENGTH 1200 MG Caps  Take 1 tablet by mouth daily.     VALIUM 5 MG tablet  Generic drug:  diazepam  Take 5 mg by mouth every 12 (twelve) hours as needed for muscle spasms.     VIAGRA 100 MG tablet  Generic drug:  sildenafil  Take 100 mg by mouth daily as needed for erectile dysfunction.     VITAMIN B-12 IJ  Inject 1 vial  into the muscle every 30 (thirty) days.     Vitamin D 2000 units Caps  Take by mouth daily.        Filed Vitals:   11/06/15 1106 11/06/15 1343  BP: 130/58 122/67  Pulse:  66  Temp:  98.4 F (36.9 C)  Resp:  18    Skin clean, dry and intact without evidence of skin break down, no evidence of skin tears noted. IV catheter discontinued intact. Site without signs and symptoms of complications. Dressing and pressure applied. Pt denies pain at this time. No complaints noted.  An After Visit Summary was printed and given to the patient. Patient escorted via [place], and D/C home via private auto.  Dola Argyle

## 2015-11-08 LAB — CULTURE, BLOOD (ROUTINE X 2)
Culture: NO GROWTH
Culture: NO GROWTH

## 2016-01-01 ENCOUNTER — Encounter: Payer: Self-pay | Admitting: *Deleted

## 2016-01-01 ENCOUNTER — Ambulatory Visit (INDEPENDENT_AMBULATORY_CARE_PROVIDER_SITE_OTHER): Payer: Medicare Other | Admitting: Urology

## 2016-01-01 VITALS — BP 145/69 | HR 76 | Ht 73.0 in | Wt 260.6 lb

## 2016-01-01 DIAGNOSIS — R351 Nocturia: Secondary | ICD-10-CM

## 2016-01-01 DIAGNOSIS — N529 Male erectile dysfunction, unspecified: Secondary | ICD-10-CM

## 2016-01-01 DIAGNOSIS — N401 Enlarged prostate with lower urinary tract symptoms: Secondary | ICD-10-CM

## 2016-01-01 DIAGNOSIS — N400 Enlarged prostate without lower urinary tract symptoms: Secondary | ICD-10-CM | POA: Insufficient documentation

## 2016-01-01 DIAGNOSIS — N138 Other obstructive and reflux uropathy: Secondary | ICD-10-CM

## 2016-01-01 LAB — URINALYSIS, COMPLETE
Bilirubin, UA: NEGATIVE
Ketones, UA: NEGATIVE
Leukocytes, UA: NEGATIVE
NITRITE UA: NEGATIVE
PH UA: 7 (ref 5.0–7.5)
Protein, UA: NEGATIVE
Specific Gravity, UA: 1.02 (ref 1.005–1.030)
UUROB: 2 mg/dL — AB (ref 0.2–1.0)

## 2016-01-01 LAB — MICROSCOPIC EXAMINATION: BACTERIA UA: NONE SEEN

## 2016-01-01 NOTE — Progress Notes (Signed)
2:18 PM   Cody Price 11-Dec-1940 [id number]  Referring provider: Madelyn Brunner, MD [facility] [place], [place] [address]  Chief Complaint  Patient presents with  . Nocturia    follow up  . Erectile Dysfunction    HPI: Patient is a 75 year old [name] with BPH with LUTS, nocturia and ED who presents today for a one year follow up.    BPH WITH LUTS His IPSS score today is 4, which is mild lower urinary tract symptomatology. He is mostly satisfied with his quality life due to his urinary symptoms.  His previous IPSS score was 13/2.  His previous PVR is 0 mL.  He denies any dysuria, hematuria or suprapubic pain.  He also denies any recent fevers, chills, nausea or vomiting.  He does not have a family history of PCa.     IPSS      01/01/16 1400       International Prostate Symptom Score   How often have you had the sensation of not emptying your bladder? Not at All     How often have you had to urinate less than every two hours? Less than 1 in 5 times     How often have you found you stopped and started again several times when you urinated? Not at All     How often have you found it difficult to postpone urination? Not at All     How often have you had a weak urinary stream? Less than half the time     How often have you had to strain to start urination? Not at All     How many times did you typically get up at night to urinate? 1 Time     Total IPSS Score 4     Quality of Life due to urinary symptoms   If you were to spend the rest of your life with your urinary condition just the way it is now how would you feel about that? Mostly Satisfied        Score:  1-7 Mild 8-19 Moderate 20-35 Severe  Nocturia Patient states that his nocturia has spontaneously resolved.     Erectile dysfunction Patient was given a SHIM score sheet, but refused to fill it out.  He has been having difficulty with erections for several years.   His  major complaint is achieving an erection.  His libido is preserved.   His risk factors for ED are age, hyperlipidemia, diabetes, anticoagulation therapy, hypertension, hypogonadism and BPH.  He denies any painful erections or curvatures with his erections.   He has tried PDE 5 inhibitors in the past, but they have been ineffective.  He was given a prescription for Trimix injections, but he found it cost prohibitive.  He stated that he is just using the Viagra.  PMH: Past Medical History  Diagnosis Date  . CAD (coronary artery disease) 2010    stent  . Ischemic heart disease 1991    with angioplasty  . HLD (hyperlipidemia)   . Spinal stenosis   . Pernicious anemia   . Peripheral neuropathy ([place])   . Elevated PSA   . Pulmonary embolism ([place])     previous  . Morbid obesity ([place])   . Kidney stone   . Diabetes mellitus   . Hypertension   . Myocardial infarction ([place])   . Sleep apnea     pt. denies sleep apnea, pt. states  he has had 2 studies - last one 2011  . Pneumonia 1995    /w PE,   . H/O CT scan     recent renal CT due to hematuria, cystoscopy - normal    . GERD (gastroesophageal reflux disease)   . History of hiatal hernia   . Arthritis     degenerative back   . Renal cyst   . ED (erectile dysfunction)   . Hematuria   . BPH with obstruction/lower urinary tract symptoms   . History of kidney stones   . Gross hematuria     Surgical History: Past Surgical History  Procedure Laterality Date  . Cad      stent 2010  . Bilateral inguinal hernia repair    . Cholecystectomy    . Appendectomy    . Laminectomy    . Back surgery      x3 back surgeries - /w fusioin, 1- [place]  . Carpal tunnel release  2014    bilateral   . Cardiac catheterization  08/17/2014    Home Medications:    Medication List       This list is accurate as of: 01/01/16  2:18 PM.  Always use your most recent med list.               azithromycin 250 MG tablet  Commonly known as:   ZITHROMAX  Take 1 tablet (250 mg total) by mouth daily.     cefUROXime 250 MG tablet  Commonly known as:  CEFTIN  Take 1 tablet (250 mg total) by mouth 2 (two) times daily with a meal.     clopidogrel 75 MG tablet  Commonly known as:  PLAVIX  TAKE ONE TABLET BY MOUTH ONCE DAILY     CO Q-10 PO  Take 1 capsule by mouth daily.     diltiazem 180 MG 24 hr capsule  Commonly known as:  CARDIZEM CD  Take 180 mg by mouth daily before breakfast.     fluconazole 100 MG tablet  Commonly known as:  DIFLUCAN  Reported on 01/01/2016     glimepiride 4 MG tablet  Commonly known as:  AMARYL  Take 4 mg by mouth daily. Sometimes pt. Takes this med. Twice daily     guaiFENesin-codeine 100-10 MG/5ML syrup  Take 10 mLs by mouth every 4 (four) hours as needed for cough.     hydrochlorothiazide 25 MG tablet  Commonly known as:  HYDRODIURIL  Take 25 mg by mouth daily.     lisinopril 10 MG tablet  Commonly known as:  PRINIVIL,ZESTRIL  TAKE ONE TABLET BY MOUTH ONCE DAILY     omeprazole 20 MG capsule  Commonly known as:  PRILOSEC  Take 20 mg by mouth 2 (two) times daily.     ONE TOUCH ULTRA TEST test strip  Generic drug:  glucose blood     PERCOCET 5-325 MG tablet  Generic drug:  oxyCODONE-acetaminophen  Take 2 tablets by mouth every 4 (four) hours as needed for moderate pain.     predniSONE 10 MG (21) Tbpk tablet  Commonly known as:  STERAPRED UNI-PAK 21 TAB  Take 1 tablet (10 mg total) by mouth daily. 40mg  oral 1 day, then 20mg  oral for 2 days, then 10mg  oral 2 days, then stop     PROAIR HFA 108 (90 Base) MCG/ACT inhaler  Generic drug:  albuterol  Inhale 1-2 puffs into the lungs every 6 (six) hours as needed. Reported on 01/01/2016  simvastatin 40 MG tablet  Commonly known as:  ZOCOR  Take 40 mg by mouth daily before breakfast.     testosterone cypionate 200 MG/ML injection  Commonly known as:  DEPOTESTOSTERONE CYPIONATE  every 30 (thirty) days.     TH FISH OIL EXTRA STRENGTH  1200 MG Caps  Take 1 tablet by mouth daily.     VALIUM 5 MG tablet  Generic drug:  diazepam  Take 5 mg by mouth every 12 (twelve) hours as needed for muscle spasms.     VIAGRA 100 MG tablet  Generic drug:  sildenafil  Take 100 mg by mouth daily as needed for erectile dysfunction.     VITAMIN B-12 IJ  Inject 1 vial into the muscle every 30 (thirty) days.     Vitamin D 2000 units Caps  Take by mouth daily.        Allergies:  Allergies  Allergen Reactions  . Amitriptyline   . Lyrica [Pregabalin]   . Neurontin [Gabapentin]   . Nitroglycerin Other (See Comments)    Makes Blood pressure go to low.   . Novocain [Procaine]   . Procaine Hcl Other (See Comments)    Passes out    Family History: Family History  Problem Relation Age of Onset  . Heart failure Mother   . Heart attack Brother   . Skin cancer Sister   . Heart disease Father   . Kidney disease Neg Hx   . Prostate cancer Neg Hx     Social History:  reports that he quit smoking about 30 years ago. He does not have any smokeless tobacco history on file. He reports that he drinks alcohol. He reports that he does not use illicit drugs.  ROS: UROLOGY Frequent Urination?: No Hard to postpone urination?: No Burning/pain with urination?: No Get up at night to urinate?: No Leakage of urine?: No Urine stream starts and stops?: No Trouble starting stream?: No Do you have to strain to urinate?: No Blood in urine?: No Urinary tract infection?: No Sexually transmitted disease?: No Injury to kidneys or bladder?: No Painful intercourse?: No Weak stream?: No Erection problems?: Yes Penile pain?: No  Gastrointestinal Nausea?: No Vomiting?: No Indigestion/heartburn?: No Diarrhea?: No Constipation?: No  Constitutional Fever: No Night sweats?: No Weight loss?: No Fatigue?: No  Skin Skin rash/lesions?: No Itching?: No  Eyes Blurred vision?: No Double vision?: No  Ears/Nose/Throat Sore throat?: No Sinus  problems?: No  Hematologic/Lymphatic Swollen glands?: No Easy bruising?: No  Cardiovascular Leg swelling?: No Chest pain?: Yes  Respiratory Cough?: No Shortness of breath?: No  Endocrine Excessive thirst?: No  Musculoskeletal Back pain?: No Joint pain?: No  Neurological Headaches?: No Dizziness?: No  Psychologic Depression?: No Anxiety?: No  Physical Exam: BP 145/69 mmHg  Pulse 76  Ht 6\' 1"  (1.854 m)  Wt 260 lb 9.6 oz (118.207 kg)  BMI 34.39 kg/m2  Constitutional: Well nourished. Alert and oriented, No acute distress. HEENT: [place] AT, moist mucus membranes. Trachea midline, no masses. Cardiovascular: No clubbing, cyanosis, or edema. Respiratory: Normal respiratory effort, no increased work of breathing. GI: Abdomen is soft, non tender, non distended, no abdominal masses. Liver and spleen not palpable.  No hernias appreciated.  Stool sample for occult testing is not indicated.   GU: No CVA tenderness.  No bladder fullness or masses.  Patient with uncircumcised phallus.  Foreskin easily retracted  Urethral meatus is patent.  No penile discharge. No penile lesions or rashes. Scrotum without lesions, cysts, rashes and/or edema.  Testicles are located scrotally bilaterally. No masses are appreciated in the testicles. Left and right epididymis are normal. Rectal: Patient with  normal sphincter tone. Anus and perineum without scarring or rashes. No rectal masses are appreciated. Prostate (could not palpated entire gland due to buttocks tissue). Skin: No rashes, bruises or suspicious lesions. Lymph: No cervical or inguinal adenopathy. Neurologic: Grossly intact, no focal deficits, moving all 4 extremities. Psychiatric: Normal mood and affect.  Laboratory Data: Lab Results  Component Value Date   WBC 18.9* 11/03/2015   HGB 12.9* 11/03/2015   HCT 36.5* 11/03/2015   MCV 87.2 11/03/2015   PLT 75* 11/03/2015   Lab Results  Component Value Date   CREATININE 0.85 11/03/2015     Lab Results  Component Value Date   AST 25 11/03/2015   Lab Results  Component Value Date   ALT 14* 11/03/2015       Assessment & Plan:    1. Nocturia:  Resolved.  2. Erectile dysfunction:   Patient given samples of Viagra.  He will return in 1 year for SHIM score and exam.  3. BPH with LUTS:   IPSS score is 4/2.  We will continue to monitor.  He will have his IPSS score and exam  in 12 months.   Return in about 1 year (around 12/31/2016) for IPSS, SHIM and exam.  These notes generated with voice recognition software. I apologize for typographical errors.  Zara Council, [facility] [address], [place], [place] [address] [phone]

## 2016-01-02 LAB — PSA: Prostate Specific Ag, Serum: 2.3 ng/mL (ref 0.0–4.0)

## 2016-01-05 ENCOUNTER — Telehealth: Payer: Self-pay

## 2016-01-05 NOTE — Telephone Encounter (Signed)
-----   Message from Nori Riis, PA-C sent at 01/05/2016  1:44 PM EDT ----- PSA is stable.  We will see him in one year.

## 2016-01-05 NOTE — Telephone Encounter (Signed)
Spoke with pt in reference to PSA results. Pt voiced understanding.  

## 2016-03-24 ENCOUNTER — Ambulatory Visit (INDEPENDENT_AMBULATORY_CARE_PROVIDER_SITE_OTHER): Payer: Medicare Other | Admitting: Cardiovascular Disease

## 2016-03-24 ENCOUNTER — Encounter: Payer: Self-pay | Admitting: Cardiovascular Disease

## 2016-03-24 VITALS — BP 157/82 | HR 75 | Ht 74.0 in | Wt 263.0 lb

## 2016-03-24 DIAGNOSIS — E1159 Type 2 diabetes mellitus with other circulatory complications: Secondary | ICD-10-CM

## 2016-03-24 DIAGNOSIS — M549 Dorsalgia, unspecified: Secondary | ICD-10-CM

## 2016-03-24 DIAGNOSIS — I4891 Unspecified atrial fibrillation: Secondary | ICD-10-CM | POA: Diagnosis not present

## 2016-03-24 DIAGNOSIS — E785 Hyperlipidemia, unspecified: Secondary | ICD-10-CM

## 2016-03-24 DIAGNOSIS — I209 Angina pectoris, unspecified: Secondary | ICD-10-CM

## 2016-03-24 DIAGNOSIS — D696 Thrombocytopenia, unspecified: Secondary | ICD-10-CM

## 2016-03-24 DIAGNOSIS — I2510 Atherosclerotic heart disease of native coronary artery without angina pectoris: Secondary | ICD-10-CM | POA: Diagnosis not present

## 2016-03-24 DIAGNOSIS — G8929 Other chronic pain: Secondary | ICD-10-CM

## 2016-03-24 NOTE — Progress Notes (Signed)
Patient ID: Cody Price, male   DOB: 01-30-1941, 75 y.o.   MRN: [id number] Cardiology Office Note  Date:  03/24/2016   ID:  Cody Price, DOB 01/02/1941, MRN [id number]  PCP:  Cody Brunner, MD   Chief Complaint  Patient presents with  . other    6 month f/u. Meds reviewed verbally with pt.    HPI:  Cody Price is a 75 year old gentleman with a history of coronary artery disease, PTCA in 1991 of his RCA that later occluded 100%, with chest pain in September 2010 and stent placed to his LAD, also a history of atrial fibrillation following recent back surgery in the spontaneously converted with improved pain control presenting for routine follow-up of his coronary artery disease. history of diabetes and complications including lower extremity neuropathy. s/p back surgery for lumbar spinal stenosis and DJD He is unable to tolerate aspirin due to her history of GI problems 20 years ago, erosion He previously stopped the metoprolol on his own as he reported having fatigue.  Previously seen by Cody Price for low platelets, treated with prednisone  In follow-up, he denies any significant chest pain On his last clinic visit he did report having one episode, none since then He has been busy planting vegetables, growing tomatoes, corn, cantaloupe  Some significant fatigue at times, feels it is from his age Weight continues to be high, no regular exercise program Reports blood pressure runs [id number] to [id number] systolic, he does not feel it is high Overall no new complaints  Platelets typically run 50-70 If platelets were better, would like back surgery. He is troubled by chronic back pain Lab work reviewed showing hemoglobin A1c 6.1, total cholesterol 111, LDL 56  Nonsmoker   EKG from today reviewed with him showing normal sinus rhythm, rate 75 bpm, no significant ST or T-wave changes, left axis deviation, no change from prior EKG  Other past medical history He presented to the emergency  room August 25 2014 with chest pain. Cardiac enzymes were negative Recovered well from his cardiac catheterization 08/26/2014. Cardiac catheterization was performed for chest pain. This showed an occluded proximal RCA which was chronic, mild to moderate proximal left circumflex, moderate distal left circumflex disease of a small vessel, moderate proximal OM disease moderate size vessel, LAD proximal stent patent, normal ejection fraction. Medical management was recommended.  Since the cardiac catheterization, he denies any further episodes of chest pain. He does have significant fatigue. He is active, Limited in his activities by his back pain but denies any significant shortness of breath or chest pain with exertion.  Minimal lower extremity edema He denies any significant nausea, chest tightness or heaviness. This was his previous anginal equivalent.  lab work 08/26/2014 showing total cholesterol 94, LDL 33, HDL 23  Cardiac catheterization September 2000 and details a 75% proximal LAD lesion, 100% RCA lesion, 30% left circumflex lesion a xience 2.75 x 15 mm DES stent was placed.  PMH:   has a past medical history of CAD (coronary artery disease) (2010); Ischemic heart disease (1991); HLD (hyperlipidemia); Spinal stenosis; Pernicious anemia; Peripheral neuropathy ([place]); Elevated PSA; Pulmonary embolism ([place]); Morbid obesity ([place]); Kidney stone; Diabetes mellitus; Hypertension; Myocardial infarction ([place]); Sleep apnea; Pneumonia (1995); H/O CT scan; GERD (gastroesophageal reflux disease); History of hiatal hernia; Arthritis; Renal cyst; ED (erectile dysfunction); Hematuria; BPH with obstruction/lower urinary tract symptoms; History of kidney stones; and Gross hematuria.  PSH:    Past Surgical History  Procedure Laterality Date  .  Cad      stent 2010  . Bilateral inguinal hernia repair    . Cholecystectomy    . Appendectomy    . Laminectomy    . Back surgery      x3 back surgeries - /w  fusioin, 1- [place]  . Carpal tunnel release  2014    bilateral   . Cardiac catheterization  08/17/2014    Current Outpatient Prescriptions  Medication Sig Dispense Refill  . Cholecalciferol (VITAMIN D) 2000 UNITS CAPS Take by mouth daily.      . clopidogrel (PLAVIX) 75 MG tablet TAKE ONE TABLET BY MOUTH ONCE DAILY 90 tablet 2  . Coenzyme Q10 (CO Q-10 PO) Take 1 capsule by mouth daily.     . Cyanocobalamin (VITAMIN B-12 IJ) Inject 1 vial into the muscle every 30 (thirty) days.     . diazepam (VALIUM) 5 MG tablet Take 5 mg by mouth every 12 (twelve) hours as needed for muscle spasms.     Marland Kitchen diltiazem (CARDIZEM CD) 180 MG 24 hr capsule Take 180 mg by mouth daily before breakfast.     . glimepiride (AMARYL) 4 MG tablet Take 4 mg by mouth daily. Sometimes pt. Takes this med. Twice daily    . glucose blood (ONE TOUCH ULTRA TEST) test strip     . hydrochlorothiazide 25 MG tablet Take 25 mg by mouth daily.      Marland Kitchen lisinopril (PRINIVIL,ZESTRIL) 10 MG tablet TAKE ONE TABLET BY MOUTH ONCE DAILY 90 tablet 2  . Omega-3 Fatty Acids (TH FISH OIL EXTRA STRENGTH) 1200 MG CAPS Take 1 tablet by mouth daily.     Marland Kitchen omeprazole (PRILOSEC) 20 MG capsule Take 20 mg by mouth 2 (two) times daily.     Marland Kitchen oxyCODONE-acetaminophen (PERCOCET) 5-325 MG per tablet Take 2 tablets by mouth every 4 (four) hours as needed for moderate pain.     . sildenafil (VIAGRA) 100 MG tablet Take 100 mg by mouth daily as needed for erectile dysfunction.     . simvastatin (ZOCOR) 40 MG tablet Take 40 mg by mouth daily before breakfast.     . testosterone cypionate (DEPOTESTOSTERONE CYPIONATE) 200 MG/ML injection every 30 (thirty) days.     No current facility-administered medications for this visit.     Allergies:   Amitriptyline; Lyrica; Neurontin; Nitroglycerin; Novocain; and Procaine hcl   Social History:  The patient  reports that he quit smoking about 30 years ago. He does not have any smokeless tobacco history on file.  He reports that he drinks alcohol. He reports that he does not use illicit drugs.   Family History:   family history includes Heart attack in his brother; Heart disease in his father; Heart failure in his mother; Skin cancer in his sister. There is no history of Kidney disease or Prostate cancer.    Review of Systems: Review of Systems  Constitutional: Positive for malaise/fatigue.  Respiratory: Negative.   Cardiovascular: Negative.   Gastrointestinal: Negative.   Musculoskeletal: Positive for back pain.  Neurological: Negative.   Psychiatric/Behavioral: Negative.   All other systems reviewed and are negative.    PHYSICAL EXAM: VS:  BP 157/82 mmHg  Pulse 75  Ht 6\' 2"  (1.88 m)  Wt 263 lb (119.296 kg)  BMI 33.75 kg/m2 , BMI Body mass index is 33.75 kg/(m^2). GEN: Well nourished, well developed, in no acute distress, obese HEENT: normal Neck: no JVD, carotid bruits, or masses Cardiac: RRR; no murmurs, rubs, or gallops,no edema  Respiratory:  clear to auscultation bilaterally, normal work of breathing GI: soft, nontender, nondistended, + BS MS: no deformity or atrophy Skin: warm and dry, no rash Neuro:  Strength and sensation are intact Psych: euthymic mood, full affect    Recent Labs: 11/03/2015: ALT 14*; BUN 14; Creatinine, Ser 0.85; Hemoglobin 12.9*; Platelets 75*; Potassium 3.6; Sodium 135    Lipid Panel Lab Results  Component Value Date   CHOL 94 08/26/2014   HDL 23* 08/26/2014   LDLCALC 33 08/26/2014   TRIG 191 08/26/2014      Wt Readings from Last 3 Encounters:  03/24/16 263 lb (119.296 kg)  01/01/16 260 lb 9.6 oz (118.207 kg)  11/03/15 261 lb 8 oz (118.616 kg)       ASSESSMENT AND PLAN:  Atrial fibrillation, unspecified type ([place]) - Plan: EKG 12-Lead Maintaining normal sinus rhythm, frequent ectopy but he reports he is asymptomatic No changes to his medications  Angina pectoris ([place]) Currently with no symptoms of angina. No further workup at this  time. Continue current medication regimen.  Hyperlipidemia Cholesterol is at goal on the current lipid regimen. No changes to the medications were made.  Atherosclerosis of native coronary artery of native heart without angina pectoris As above, denies any angina Nonsmoker, diabetes well-controlled, cholesterol well controlled  Thrombocytopenia (H[facility]) Low platelets, previously managed with steroids  Type 2 diabetes mellitus with other circulatory complication ([place]) Diabetes well-controlled Recommended regular exercise program though limited secondary to his back  Chronic back pain Unable to perform surgery secondary to low platelets   Total encounter time more than 25 minutes  Greater than 50% was spent in counseling and coordination of care with the patient   Disposition:   F/U  6 months   Orders Placed This Encounter  Procedures  . EKG 12-Lead     Signed, Esmond Plants, M.D., Ph.D. 03/24/2016  [place], [place]

## 2016-03-24 NOTE — Patient Instructions (Signed)
Medication Instructions:   No new medication changes  Monitor the blood pressure   Follow-Up: It was a pleasure seeing you in the office today. Please call us if you have new issues that need to be addressed before your next appt.  [phone]  Your physician wants you to follow-up in: 6 months.  You will receive a reminder letter in the mail two months in advance. If you don't receive a letter, please call our office to schedule the follow-up appointment.  If you need a refill on your cardiac medications before your next appointment, please call your pharmacy.

## 2016-05-27 DIAGNOSIS — F1190 Opioid use, unspecified, uncomplicated: Secondary | ICD-10-CM | POA: Insufficient documentation

## 2016-09-21 ENCOUNTER — Encounter: Payer: Self-pay | Admitting: Cardiovascular Disease

## 2016-09-21 ENCOUNTER — Ambulatory Visit (INDEPENDENT_AMBULATORY_CARE_PROVIDER_SITE_OTHER): Payer: Medicare HMO | Admitting: Cardiovascular Disease

## 2016-09-21 VITALS — BP 154/78 | HR 94 | Ht 73.0 in | Wt 269.5 lb

## 2016-09-21 DIAGNOSIS — E1159 Type 2 diabetes mellitus with other circulatory complications: Secondary | ICD-10-CM | POA: Diagnosis not present

## 2016-09-21 DIAGNOSIS — G629 Polyneuropathy, unspecified: Secondary | ICD-10-CM | POA: Insufficient documentation

## 2016-09-21 DIAGNOSIS — D696 Thrombocytopenia, unspecified: Secondary | ICD-10-CM | POA: Diagnosis not present

## 2016-09-21 DIAGNOSIS — E782 Mixed hyperlipidemia: Secondary | ICD-10-CM | POA: Diagnosis not present

## 2016-09-21 DIAGNOSIS — G4700 Insomnia, unspecified: Secondary | ICD-10-CM

## 2016-09-21 DIAGNOSIS — I4891 Unspecified atrial fibrillation: Secondary | ICD-10-CM | POA: Diagnosis not present

## 2016-09-21 DIAGNOSIS — I2510 Atherosclerotic heart disease of native coronary artery without angina pectoris: Secondary | ICD-10-CM

## 2016-09-21 NOTE — Patient Instructions (Signed)

## 2016-09-21 NOTE — Progress Notes (Signed)
Cardiology Office Note  Date:  09/21/2016   ID:  Cody Price, DOB 01-07-1941, MRN [id number]  PCP:  Cody Brunner, MD   Chief Complaint  Patient presents with  . other    96mof/u. Pt c/o chest pain and sob at times. Reviewed meds with pt verbally.    HPI:  Mr. Cody Price a 76year old gentleman with a history of coronary artery disease, PTCA in 1991 of his RCA that later occluded 100%, with chest pain in September 2010 and stent placed to his LAD, also a history of atrial fibrillation following back surgery in the spontaneously converted with improved pain control presenting for routine follow-up of his coronary artery disease. history of diabetes and complications including lower extremity neuropathy. s/p back surgery for lumbar spinal stenosis and DJD He is unable to tolerate aspirin due to her history of GI problems 20 years ago, erosion He previously stopped the metoprolol on his own as he reported having fatigue.  Previously seen by Dr. FGrayland Ormondfor low platelets, treated with prednisone Quit smoking in 1987, smoked 20 years  In follow-up he reports no significant change to his cardiac issues Continues to be troubled by chronic back pain Unable to perform surgery secondary to low platelets Recent MRI of his back November 2017 Also has neuropathy in his lower extremities, Having problems with insomnia Would wake up in the middle of the night, moved to the recliner and sleeps better Takes Tylenol PM  BP 1768systolic at home Blood pressure elevated on today's visit. Does not want more medication  Compliant with hissimva 40 Total chol 93, LDL 33 HBA1C 6.3  Requires pain medication for chronic back pain  Over the summer is typically busy planting vegetables, growing tomatoes, corn, cantaloupe  Platelets typically run 50-70  EKG from today reviewed with him showing normal sinus rhythm, rate 94 bpm, no significant ST or T-wave changes, left axis deviation, PVCs and  APCs noted  Other past medical history He presented to the emergency room August 25 2014 with chest pain. Cardiac enzymes were negative Recovered well from his cardiac catheterization 08/26/2014. Cardiac catheterization was performed for chest pain. This showed an occluded proximal RCA which was chronic, mild to moderate proximal left circumflex, moderate distal left circumflex disease of a small vessel, moderate proximal OM disease moderate size vessel, LAD proximal stent patent, normal ejection fraction. Medical management was recommended.  Since the cardiac catheterization, he denies any further episodes of chest pain. He does have significant fatigue. He is active, Limited in his activities by his back pain but denies any significant shortness of breath or chest pain with exertion.  Minimal lower extremity edema He denies any significant nausea, chest tightness or heaviness. This was his previous anginal equivalent.  lab work 08/26/2014 showing total cholesterol 94, LDL 33, HDL 23  Cardiac catheterization September 2000 and details a 75% proximal LAD lesion, 100% RCA lesion, 30% left circumflex lesion a xience 2.75 x 15 mm DES stent was placed.  PMH:   has a past medical history of Arthritis; BPH with obstruction/lower urinary tract symptoms; CAD (coronary artery disease) (2010); Diabetes mellitus; ED (erectile dysfunction); Elevated PSA; GERD (gastroesophageal reflux disease); Gross hematuria; H/O CT scan; Hematuria; History of hiatal hernia; History of kidney stones; HLD (hyperlipidemia); Hypertension; Ischemic heart disease (1991); Kidney stone; Morbid obesity ([place]; Myocardial infarction; Peripheral neuropathy ([place]; Pernicious anemia; Pneumonia (1995); Pulmonary embolism ([place]; Renal cyst; Sleep apnea; and Spinal stenosis.  PSH:    Past Surgical  History:  Procedure Laterality Date  . APPENDECTOMY    . BACK SURGERY     x3 back surgeries - /w fusioin, 1- [place]  .  bilateral inguinal hernia repair    . cad     stent 2010  . CARDIAC CATHETERIZATION  08/17/2014  . CARPAL TUNNEL RELEASE  2014   bilateral   . CHOLECYSTECTOMY    . LAMINECTOMY      Current Outpatient Prescriptions  Medication Sig Dispense Refill  . Blood Glucose Monitoring Suppl (ONE TOUCH ULTRA 2) w/Device KIT Use as instructed.    . Cholecalciferol (VITAMIN D) 2000 UNITS CAPS Take by mouth daily.      . clopidogrel (PLAVIX) 75 MG tablet TAKE ONE TABLET BY MOUTH ONCE DAILY 90 tablet 2  . Coenzyme Q10 (CO Q-10 PO) Take 1 capsule by mouth daily.     . Cyanocobalamin (VITAMIN B-12 IJ) Inject 1 vial into the muscle every 30 (thirty) days.     . diazepam (VALIUM) 5 MG tablet Take 5 mg by mouth every 12 (twelve) hours as needed for muscle spasms.     Marland Kitchen diltiazem (CARDIZEM CD) 180 MG 24 hr capsule Take 180 mg by mouth daily before breakfast.     . glimepiride (AMARYL) 4 MG tablet Take 4 mg by mouth daily. Sometimes pt. Takes this med. Twice daily    . glucose blood (ONE TOUCH ULTRA TEST) test strip     . hydrochlorothiazide 25 MG tablet Take 25 mg by mouth daily.      Marland Kitchen lisinopril (PRINIVIL,ZESTRIL) 10 MG tablet TAKE ONE TABLET BY MOUTH ONCE DAILY 90 tablet 2  . Omega-3 Fatty Acids (TH FISH OIL EXTRA STRENGTH) 1200 MG CAPS Take 1 tablet by mouth daily.     Marland Kitchen omeprazole (PRILOSEC) 20 MG capsule Take 20 mg by mouth 2 (two) times daily.     Marland Kitchen oxyCODONE-acetaminophen (PERCOCET) 5-325 MG per tablet Take 2 tablets by mouth every 4 (four) hours as needed for moderate pain.     . sildenafil (VIAGRA) 100 MG tablet Take 100 mg by mouth daily as needed for erectile dysfunction.     . simvastatin (ZOCOR) 40 MG tablet Take 40 mg by mouth daily before breakfast.     . testosterone cypionate (DEPOTESTOSTERONE CYPIONATE) 200 MG/ML injection every 30 (thirty) days.     No current facility-administered medications for this visit.      Allergies:   Amitriptyline; Lyrica [pregabalin]; Metformin; Neurontin  [gabapentin]; Nitroglycerin; Novocain [procaine]; and Procaine hcl   Social History:  The patient  reports that he quit smoking about 30 years ago. He has a 45.00 pack-year smoking history. He has never used smokeless tobacco. He reports that he drinks alcohol. He reports that he does not use drugs.   Family History:   family history includes Heart attack in his brother; Heart disease in his father; Heart failure in his mother; Skin cancer in his sister.    Review of Systems: Review of Systems  Constitutional: Negative.   Respiratory: Negative.   Cardiovascular: Negative.   Gastrointestinal: Negative.   Musculoskeletal: Positive for back pain.  Neurological: Negative.        Neuropathy lower extremities  Psychiatric/Behavioral: Negative.   All other systems reviewed and are negative.    PHYSICAL EXAM: VS:  BP (!) 154/78 (BP Location: Left Arm, Patient Position: Sitting, Cuff Size: Normal)   Pulse 94   Ht '6\' 1"'  (1.854 m)   Wt 269 lb 8 oz (  122.2 kg)   BMI 35.56 kg/m  , BMI Body mass index is 35.56 kg/m. GEN: Well nourished, well developed, in no acute distress , obese HEENT: normal  Neck: no JVD, carotid bruits, or masses Cardiac: RRR; no murmurs, rubs, or gallops,no edema  Respiratory:  clear to auscultation bilaterally, normal work of breathing GI: soft, nontender, nondistended, + BS MS: no deformity or atrophy  Skin: warm and dry, no rash Neuro:  Strength and sensation are intact Psych: euthymic mood, full affect    Recent Labs: 11/03/2015: ALT 14; BUN 14; Creatinine, Ser 0.85; Hemoglobin 12.9; Platelets 75; Potassium 3.6; Sodium 135    Lipid Panel Lab Results  Component Value Date   CHOL 94 08/26/2014   HDL 23 (L) 08/26/2014   LDLCALC 33 08/26/2014   TRIG 191 08/26/2014      Wt Readings from Last 3 Encounters:  09/21/16 269 lb 8 oz (122.2 kg)  03/24/16 263 lb (119.3 kg)  01/01/16 260 lb 9.6 oz (118.2 kg)       ASSESSMENT AND PLAN:  Atrial  fibrillation, unspecified type ([place]) - Plan: EKG 12-Lead Remote history, Maintaining normal sinus rhythm Currently not on anticoagulation  Mixed hyperlipidemia Cholesterol is at goal on the current lipid regimen. No changes to the medications were made.  Type 2 diabetes mellitus with other circulatory complication, without long-term current use of insulin (HCC) Unable to exercise. Recommended low carbohydrate diet A1c relatively well-controlled  Thrombocytopenia (HCC) Managed by Dr. Grayland Price Reports he is unable to have surgery secondary to low platelets  Atherosclerosis of native coronary artery of native heart without angina pectoris Currently with no symptoms of angina. No further workup at this time. Continue current medication regimen.  Neuropathy He has tried numerous medications under the guidance of Dr. Gilford Rile Likely complication from his diabetes  Insomnia Likely secondary to chronic back pain, neuropathy He is taking Tylenol PM, Tylenol, pain medication   Total encounter time more than 25 minutes  Greater than 50% was spent in counseling and coordination of care with the patient   Disposition:   F/U  6 months   Orders Placed This Encounter  Procedures  . EKG 12-Lead     Signed, Esmond Plants, M.D., Ph.D. 09/21/2016  [place], [place]

## 2016-09-22 ENCOUNTER — Ambulatory Visit: Payer: Medicare Other | Admitting: Cardiovascular Disease

## 2016-12-31 NOTE — Progress Notes (Signed)
3:22 PM   Cody Price 1941/05/06 [id number]  Referring provider: Madelyn Brunner, MD [facility] [place], [place] [address]  Chief Complaint  Patient presents with  . Benign Prostatic Hypertrophy    1 year follow up   . Nocturia    HPI: Patient is a 75 year old [name] with BPH with LUTS and ED who presents today for a one year follow up.    BPH WITH LUTS His IPSS score today is 1, which is mild lower urinary tract symptomatology. He is delighted with his quality life due to his urinary symptoms.  His previous IPSS score was 4/2.  His previous PVR is 0 mL.  He denies any dysuria, hematuria or suprapubic pain.  He also denies any recent fevers, chills, nausea or vomiting.  He does not have a family history of PCa.     IPSS    Row Name 01/03/17 1500         International Prostate Symptom Score   How often have you had the sensation of not emptying your bladder? Not at All     How often have you had to urinate less than every two hours? Not at All     How often have you found you stopped and started again several times when you urinated? Not at All     How often have you found it difficult to postpone urination? Not at All     How often have you had a weak urinary stream? Not at All     How often have you had to strain to start urination? Not at All     How many times did you typically get up at night to urinate? 1 Time     Total IPSS Score 1       Quality of Life due to urinary symptoms   If you were to spend the rest of your life with your urinary condition just the way it is now how would you feel about that? Delighted        Score:  1-7 Mild 8-19 Moderate 20-35 Severe  Erectile dysfunction SHIM score is 12.  Patient was given a SHIM score sheet, but refused to fill it out at his last visit.  He has been having difficulty with erections for several years.   His major complaint is achieving an erection.  His libido is  preserved.   His risk factors for ED are age, hyperlipidemia, diabetes, anticoagulation therapy, hypertension, hypogonadism and BPH.  He denies any painful erections or curvatures with his erections.   He has tried PDE 5 inhibitors in the past, but they have been ineffective.  He was given a prescription for Trimix injections, but he found it cost prohibitive.  He stated that he is just using the Viagra.     SHIM    Row Name 01/03/17 1503         SHIM: Over the last 6 months:   How do you rate your confidence that you could get and keep an erection? Very Low     When you had erections with sexual stimulation, how often were your erections hard enough for penetration (entering your partner)? Sometimes (about half the time)     During sexual intercourse, how often were you able to maintain your erection after you had penetrated (entered) your partner? Sometimes (about half the time)     During sexual intercourse, how difficult was  it to maintain your erection to completion of intercourse? Very Difficult     When you attempted sexual intercourse, how often was it satisfactory for you? Sometimes (about half the time)       SHIM Total Score   SHIM 12         Testosterone deficiency Managed by his PCP with testosterone cypionate injections.  PMH: Past Medical History:  Diagnosis Date  . Arthritis    degenerative back   . BPH with obstruction/lower urinary tract symptoms   . CAD (coronary artery disease) 2010   stent  . Diabetes mellitus   . ED (erectile dysfunction)   . Elevated PSA   . GERD (gastroesophageal reflux disease)   . Gross hematuria   . H/O CT scan    recent renal CT due to hematuria, cystoscopy - normal    . Hematuria   . History of hiatal hernia   . History of kidney stones   . HLD (hyperlipidemia)   . Hypertension   . Ischemic heart disease 1991   with angioplasty  . Kidney stone   . Morbid obesity ([place])   . Myocardial infarction ([place])   . Peripheral  neuropathy   . Pernicious anemia   . Pneumonia 1995   /w PE,   . Pulmonary embolism ([place])    previous  . Renal cyst   . Sleep apnea    pt. denies sleep apnea, pt. states he has had 2 studies - last one 2011  . Spinal stenosis     Surgical History: Past Surgical History:  Procedure Laterality Date  . APPENDECTOMY    . BACK SURGERY     x3 back surgeries - /w fusioin, 1- [place]  . bilateral inguinal hernia repair    . cad     stent 2010  . CARDIAC CATHETERIZATION  08/17/2014  . CARPAL TUNNEL RELEASE  2014   bilateral   . CHOLECYSTECTOMY    . LAMINECTOMY      Home Medications:  Allergies as of 01/03/2017      Reactions   Amitriptyline    Lyrica [pregabalin]    Metformin Nausea Only   Neurontin [gabapentin]    Nitroglycerin Other (See Comments)   Makes Blood pressure go to low.    Novocain [procaine]    Procaine Hcl Other (See Comments)   Passes out      Medication List       Accurate as of 01/03/17  3:22 PM. Always use your most recent med list.          clopidogrel 75 MG tablet Commonly known as:  PLAVIX TAKE ONE TABLET BY MOUTH ONCE DAILY   CO Q-10 PO Take 1 capsule by mouth daily.   diltiazem 180 MG 24 hr capsule Commonly known as:  CARDIZEM CD Take 180 mg by mouth daily before breakfast.   glimepiride 4 MG tablet Commonly known as:  AMARYL Take 4 mg by mouth daily. Sometimes pt. Takes this med. Twice daily   hydrochlorothiazide 25 MG tablet Commonly known as:  HYDRODIURIL Take 25 mg by mouth daily.   lisinopril 10 MG tablet Commonly known as:  PRINIVIL,ZESTRIL TAKE ONE TABLET BY MOUTH ONCE DAILY   omeprazole 20 MG capsule Commonly known as:  PRILOSEC Take 20 mg by mouth 2 (two) times daily.   ONE TOUCH ULTRA 2 w/Device Kit Use as instructed.   ONE TOUCH ULTRA TEST test strip Generic drug:  glucose blood   PERCOCET 5-325 MG  tablet Generic drug:  oxyCODONE-acetaminophen Take 2 tablets by mouth every 4 (four) hours as needed  for moderate pain.   simvastatin 40 MG tablet Commonly known as:  ZOCOR Take 40 mg by mouth daily before breakfast.   testosterone cypionate 200 MG/ML injection Commonly known as:  DEPOTESTOSTERONE CYPIONATE every 30 (thirty) days.   FISH OIL BURP-LESS PO Take by mouth.   TH FISH OIL EXTRA STRENGTH 1200 MG Caps Take 1 tablet by mouth daily.   VALIUM 5 MG tablet Generic drug:  diazepam Take 5 mg by mouth every 12 (twelve) hours as needed for muscle spasms.   VIAGRA 100 MG tablet Generic drug:  sildenafil Take 100 mg by mouth daily as needed for erectile dysfunction.   VITAMIN B-12 IJ Inject 1 vial into the muscle every 30 (thirty) days.   Vitamin D 2000 units Caps Take by mouth daily.       Allergies:  Allergies  Allergen Reactions  . Amitriptyline   . Lyrica [Pregabalin]   . Metformin Nausea Only  . Neurontin [Gabapentin]   . Nitroglycerin Other (See Comments)    Makes Blood pressure go to low.   . Novocain [Procaine]   . Procaine Hcl Other (See Comments)    Passes out    Family History: Family History  Problem Relation Age of Onset  . Heart failure Mother   . Heart disease Father   . Heart attack Brother   . Skin cancer Sister   . Kidney disease Neg Hx   . Prostate cancer Neg Hx   . Kidney cancer Neg Hx   . Bladder Cancer Neg Hx     Social History:  reports that he quit smoking about 31 years ago. He has a 45.00 pack-year smoking history. He has never used smokeless tobacco. He reports that he drinks alcohol. He reports that he does not use drugs.  ROS: UROLOGY Frequent Urination?: No Hard to postpone urination?: No Burning/pain with urination?: No Get up at night to urinate?: Yes Leakage of urine?: No Urine stream starts and stops?: No Trouble starting stream?: No Do you have to strain to urinate?: No Blood in urine?: No Urinary tract infection?: No Sexually transmitted disease?: No Injury to kidneys or bladder?: No Painful intercourse?:  No Weak stream?: No Erection problems?: Yes Penile pain?: No  Gastrointestinal Nausea?: No Vomiting?: No Indigestion/heartburn?: No Diarrhea?: No Constipation?: No  Constitutional Fever: No Night sweats?: Yes Weight loss?: No Fatigue?: No  Skin Skin rash/lesions?: No Itching?: No  Eyes Blurred vision?: No Double vision?: No  Ears/Nose/Throat Sore throat?: No Sinus problems?: No  Hematologic/Lymphatic Swollen glands?: No Easy bruising?: No  Cardiovascular Leg swelling?: No Chest pain?: No  Respiratory Cough?: No Shortness of breath?: No  Endocrine Excessive thirst?: No  Musculoskeletal Back pain?: Yes Joint pain?: No  Neurological Headaches?: No Dizziness?: No  Psychologic Depression?: No Anxiety?: No  Physical Exam: BP (!) 142/69   Pulse 90   Ht 6' (1.829 m)   Wt 255 lb 11.2 oz (116 kg)   BMI 34.68 kg/m   Constitutional: Well nourished. Alert and oriented, No acute distress. HEENT: [place] AT, moist mucus membranes. Trachea midline, no masses. Cardiovascular: No clubbing, cyanosis, or edema. Respiratory: Normal respiratory effort, no increased work of breathing. GI: Abdomen is soft, non tender, non distended, no abdominal masses. Liver and spleen not palpable.  No hernias appreciated.  Stool sample for occult testing is not indicated.   GU: No CVA tenderness.  No bladder fullness or  masses.  Patient with uncircumcised phallus.  Foreskin easily retracted  Urethral meatus is patent.  No penile discharge. No penile lesions or rashes. Scrotum without lesions, cysts, rashes and/or edema.  Testicles are located scrotally bilaterally. No masses are appreciated in the testicles. Left and right epididymis are normal. Rectal: Patient with  normal sphincter tone. Anus and perineum without scarring or rashes. No rectal masses are appreciated. Prostate (could not palpated entire gland due to buttocks tissue). Skin: No rashes, bruises or suspicious  lesions. Lymph: No cervical or inguinal adenopathy. Neurologic: Grossly intact, no focal deficits, moving all 4 extremities. Psychiatric: Normal mood and affect.  Laboratory Data: Lab Results  Component Value Date   WBC 18.9 (H) 11/03/2015   HGB 12.9 (L) 11/03/2015   HCT 36.5 (L) 11/03/2015   MCV 87.2 11/03/2015   PLT 75 (L) 11/03/2015   Lab Results  Component Value Date   CREATININE 0.85 11/03/2015   Lab Results  Component Value Date   AST 25 11/03/2015   Lab Results  Component Value Date   ALT 14 (L) 11/03/2015    Assessment & Plan:    1. Erectile dysfunction:   Patient given samples of Viagra.  He will return in 1 year for SHIM score and exam.  2. BPH with LUTS:   IPSS score is 1/0.  We will continue to monitor.  He will have his IPSS score and exam  in 12 months.   Return in about 1 year (around 01/03/2018) for IPSS, SHIM and exam.  These notes generated with voice recognition software. I apologize for typographical errors.  Zara Council, [facility] [address], [place], [place] [address] [phone]

## 2017-01-03 ENCOUNTER — Ambulatory Visit (INDEPENDENT_AMBULATORY_CARE_PROVIDER_SITE_OTHER): Payer: Medicare HMO | Admitting: Urology

## 2017-01-03 ENCOUNTER — Encounter: Payer: Self-pay | Admitting: Urology

## 2017-01-03 VITALS — BP 142/69 | HR 90 | Ht 72.0 in | Wt 255.7 lb

## 2017-01-03 DIAGNOSIS — N138 Other obstructive and reflux uropathy: Secondary | ICD-10-CM

## 2017-01-03 DIAGNOSIS — N529 Male erectile dysfunction, unspecified: Secondary | ICD-10-CM | POA: Diagnosis not present

## 2017-01-03 DIAGNOSIS — N401 Enlarged prostate with lower urinary tract symptoms: Secondary | ICD-10-CM | POA: Diagnosis not present

## 2017-01-13 ENCOUNTER — Other Ambulatory Visit: Payer: Self-pay | Admitting: Internal Medicine

## 2017-01-13 DIAGNOSIS — D696 Thrombocytopenia, unspecified: Secondary | ICD-10-CM

## 2017-01-14 ENCOUNTER — Other Ambulatory Visit: Payer: Self-pay | Admitting: Internal Medicine

## 2017-01-14 DIAGNOSIS — D6859 Other primary thrombophilia: Secondary | ICD-10-CM

## 2017-01-17 ENCOUNTER — Ambulatory Visit: Admission: RE | Admit: 2017-01-17 | Payer: Medicare HMO | Source: Ambulatory Visit

## 2017-01-17 ENCOUNTER — Ambulatory Visit: Payer: Medicare HMO

## 2017-03-26 NOTE — Progress Notes (Signed)
Cardiology Office Note  Date:  03/28/2017   ID:  BERNICE MULLIN, DOB 09/15/1940, MRN [id number]  PCP:  Madelyn Brunner, MD   Chief Complaint  Patient presents with  . OTHER    6 month f/u c/o back pain. Meds reviewed verbally with pt.    HPI:  Mr. Ines is a 76 year old gentleman with a history of  coronary artery disease,  PTCA in 1991 of his RCA that later occluded 100%,  chest pain in September 2010   stent placed to his LAD Stable since that time atrial fibrillation following back surgery  spontaneously converted  Diabetes, lower extremity neuropathy. s/p back surgery for lumbar spinal stenosis and DJD  unable to tolerate aspirin, GI problems 20 years ago,  stopped the metoprolol on his own as he reported having fatigue.  seen by Dr. Grayland Ormond for low platelets, treated with prednisone Quit smoking in 1987, smoked 20 years presenting for routine follow-up of his coronary artery disease.  In follow-up today he reports having no significant cardiac issues Denies any anginal symptoms, no shortness of breath on exertion Continues to be troubled by chronic back pain, worse now Has been seen at [facility], scheduled to see neurosurgery later this week Appears to have been cleared by hematology for surgery He is eager to have surgery as he has severe pain, sciatica Much less active than he used to be over the past 6 months secondary to back limitations Over the summer is typically busy planting vegetables, growing tomatoes, corn, cantaloupe  Not doing much gardening this year  MRI of his back November 2017 Taking pain medication as well as Tylenol Now walking with a cane,  Compliant with hissimva 40 Total chol 93, LDL 33 HBA1C 6.3  EKG from today reviewed with him showing normal sinus rhythm, rate 73 bpm, no significant ST or T-wave changes  Other past medical history He presented to the emergency room August 25 2014 with chest pain. Cardiac enzymes were  negative Recovered well from his cardiac catheterization 08/26/2014. Cardiac catheterization was performed for chest pain. This showed an occluded proximal RCA which was chronic, mild to moderate proximal left circumflex, moderate distal left circumflex disease of a small vessel, moderate proximal OM disease moderate size vessel, LAD proximal stent patent, normal ejection fraction. Medical management was recommended.  Since the cardiac catheterization, he denies any further episodes of chest pain. He does have significant fatigue. He is active, Limited in his activities by his back pain but denies any significant shortness of breath or chest pain with exertion.  Minimal lower extremity edema He denies any significant nausea, chest tightness or heaviness. This was his previous anginal equivalent.  lab work 08/26/2014 showing total cholesterol 94, LDL 33, HDL 23  Cardiac catheterization September 2000 and details a 75% proximal LAD lesion, 100% RCA lesion, 30% left circumflex lesion a xience 2.75 x 15 mm DES stent was placed.  PMH:   has a past medical history of Arthritis; BPH with obstruction/lower urinary tract symptoms; CAD (coronary artery disease) (2010); Diabetes mellitus; ED (erectile dysfunction); Elevated PSA; GERD (gastroesophageal reflux disease); Gross hematuria; H/O CT scan; Hematuria; History of hiatal hernia; History of kidney stones; HLD (hyperlipidemia); Hypertension; Ischemic heart disease (1991); Kidney stone; Morbid obesity ([place]); Myocardial infarction ([place]); Peripheral neuropathy; Pernicious anemia; Pneumonia (1995); Pulmonary embolism ([place]); Renal cyst; Sleep apnea; and Spinal stenosis.  PSH:    Past Surgical History:  Procedure Laterality Date  . APPENDECTOMY    . BACK SURGERY  x3 back surgeries - /w fusioin, [place]  . bilateral inguinal hernia repair    . cad     stent 2010  . CARDIAC CATHETERIZATION  08/17/2014  . CARPAL TUNNEL RELEASE  2014    bilateral   . CHOLECYSTECTOMY    . LAMINECTOMY      Current Outpatient Prescriptions  Medication Sig Dispense Refill  . Blood Glucose Monitoring Suppl (ONE TOUCH ULTRA 2) w/Device KIT Use as instructed.    . Cholecalciferol (VITAMIN D) 2000 UNITS CAPS Take by mouth daily.      . clopidogrel (PLAVIX) 75 MG tablet TAKE ONE TABLET BY MOUTH ONCE DAILY 90 tablet 2  . Coenzyme Q10 (CO Q-10 PO) Take 1 capsule by mouth daily.     . Cyanocobalamin (VITAMIN B-12 IJ) Inject 1 vial into the muscle every 30 (thirty) days.     . diazepam (VALIUM) 5 MG tablet Take 5 mg by mouth every 12 (twelve) hours as needed for muscle spasms.     Marland Kitchen diltiazem (CARDIZEM CD) 180 MG 24 hr capsule Take 180 mg by mouth daily before breakfast.     . glimepiride (AMARYL) 4 MG tablet Take 4 mg by mouth daily. Sometimes pt. Takes this med. Twice daily    . glucose blood (ONE TOUCH ULTRA TEST) test strip     . hydrochlorothiazide 25 MG tablet Take 25 mg by mouth daily.      Marland Kitchen lisinopril (PRINIVIL,ZESTRIL) 10 MG tablet TAKE ONE TABLET BY MOUTH ONCE DAILY 90 tablet 2  . Omega-3 Fatty Acids (FISH OIL BURP-LESS PO) Take by mouth.    . Omega-3 Fatty Acids (TH FISH OIL EXTRA STRENGTH) 1200 MG CAPS Take 1 tablet by mouth daily.     Marland Kitchen omeprazole (PRILOSEC) 20 MG capsule Take 20 mg by mouth 2 (two) times daily.     Marland Kitchen oxyCODONE-acetaminophen (PERCOCET) 5-325 MG per tablet Take 2 tablets by mouth every 4 (four) hours as needed for moderate pain.     . sildenafil (VIAGRA) 100 MG tablet Take 100 mg by mouth daily as needed for erectile dysfunction.     . simvastatin (ZOCOR) 40 MG tablet Take 40 mg by mouth daily before breakfast.     . testosterone cypionate (DEPOTESTOSTERONE CYPIONATE) 200 MG/ML injection every 30 (thirty) days.     No current facility-administered medications for this visit.      Allergies:   Amitriptyline; Lyrica [pregabalin]; Metformin; Neurontin [gabapentin]; Nitroglycerin; Novocain [procaine]; and Procaine hcl    Social History:  The patient  reports that he quit smoking about 31 years ago. He has a 45.00 pack-year smoking history. He has never used smokeless tobacco. He reports that he drinks alcohol. He reports that he does not use drugs.   Family History:   family history includes Heart attack in his brother; Heart disease in his father; Heart failure in his mother; Skin cancer in his sister.    Review of Systems: Review of Systems  Constitutional: Negative.   Respiratory: Negative.   Cardiovascular: Negative.   Gastrointestinal: Negative.   Musculoskeletal: Positive for back pain.  Neurological: Negative.        Neuropathy lower extremities  Psychiatric/Behavioral: Negative.   All other systems reviewed and are negative.    PHYSICAL EXAM: VS:  BP 123/66 (BP Location: Left Arm, Patient Position: Sitting, Cuff Size: Large)   Pulse 73   Ht 6' 1" (1.854 m)   Wt 256 lb (116.1 kg)   BMI 33.78 kg/m  ,  BMI Body mass index is 33.78 kg/m.  GEN: Well nourished, well developed, in no acute distress , obese HEENT: normal  Neck: no JVD, carotid bruits, or masses Cardiac: RRR; no murmurs, rubs, or gallops,no edema  Respiratory:  clear to auscultation bilaterally, normal work of breathing GI: soft, nontender, nondistended, + BS MS: no deformity or atrophy  Skin: warm and dry, no rash Neuro:  Strength and sensation are intact Psych: euthymic mood, full affect    Recent Labs: No results found for requested labs within last 8760 hours.    Lipid Panel Lab Results  Component Value Date   CHOL 94 08/26/2014   HDL 23 (L) 08/26/2014   LDLCALC 33 08/26/2014   TRIG 191 08/26/2014      Wt Readings from Last 3 Encounters:  03/28/17 256 lb (116.1 kg)  01/03/17 255 lb 11.2 oz (116 kg)  09/21/16 269 lb 8 oz (122.2 kg)       ASSESSMENT AND PLAN:  Preop cardiovascular Acceptable risk for back surgery at [facility] Recommended he come off his Plavix 5 days or more, whatever is needed Would  suggest he consider taking low-dose aspirin 81 mg coated when he is off his Plavix No further testing needed, currently with no anginal symptoms, EKG stable  Atrial fibrillation, unspecified type ([place]) - Plan: EKG 12-Lead Remote history, Maintaining normal sinus rhythm Currently not on anticoagulation  Mixed hyperlipidemia Cholesterol is at goal on the current lipid regimen. No changes to the medications were made.  Type 2 diabetes mellitus with other circulatory complication, without long-term current use of insulin (HCC) Unable to exercise. Recommended low carbohydrate diet A1c relatively well-controlled   Thrombocytopenia (HCC) Recently seen by hematology at [facility]. Numbers are stable  Atherosclerosis of native coronary artery of native heart without angina pectoris Currently with no symptoms of angina. No further workup at this time. Continue current medication regimen.  Neuropathy Management per primary care    Total encounter time more than 25 minutes  Greater than 50% was spent in counseling and coordination of care with the patient   Disposition:   F/U  12 months   Orders Placed This Encounter  Procedures  . EKG 12-Lead     Signed, Esmond Plants, M.D., Ph.D. 03/28/2017  [place], [place]

## 2017-03-28 ENCOUNTER — Ambulatory Visit (INDEPENDENT_AMBULATORY_CARE_PROVIDER_SITE_OTHER): Payer: Medicare HMO | Admitting: Cardiovascular Disease

## 2017-03-28 ENCOUNTER — Encounter: Payer: Self-pay | Admitting: Cardiovascular Disease

## 2017-03-28 VITALS — BP 123/66 | HR 73 | Ht 73.0 in | Wt 256.0 lb

## 2017-03-28 DIAGNOSIS — I25118 Atherosclerotic heart disease of native coronary artery with other forms of angina pectoris: Secondary | ICD-10-CM | POA: Diagnosis not present

## 2017-03-28 DIAGNOSIS — I480 Paroxysmal atrial fibrillation: Secondary | ICD-10-CM | POA: Diagnosis not present

## 2017-03-28 DIAGNOSIS — E1159 Type 2 diabetes mellitus with other circulatory complications: Secondary | ICD-10-CM

## 2017-03-28 DIAGNOSIS — E782 Mixed hyperlipidemia: Secondary | ICD-10-CM

## 2017-03-28 NOTE — Patient Instructions (Signed)

## 2017-04-11 DIAGNOSIS — Z7902 Long term (current) use of antithrombotics/antiplatelets: Secondary | ICD-10-CM | POA: Insufficient documentation

## 2017-04-13 DIAGNOSIS — M961 Postlaminectomy syndrome, not elsewhere classified: Secondary | ICD-10-CM | POA: Insufficient documentation

## 2017-04-13 DIAGNOSIS — G8928 Other chronic postprocedural pain: Secondary | ICD-10-CM | POA: Insufficient documentation

## 2017-11-10 ENCOUNTER — Encounter: Payer: Self-pay | Admitting: Urology

## 2017-11-10 ENCOUNTER — Ambulatory Visit: Payer: Medicare HMO | Admitting: Urology

## 2017-11-10 VITALS — BP 159/68 | HR 69 | Ht 73.0 in | Wt 260.0 lb

## 2017-11-10 DIAGNOSIS — N138 Other obstructive and reflux uropathy: Secondary | ICD-10-CM

## 2017-11-10 DIAGNOSIS — R351 Nocturia: Secondary | ICD-10-CM | POA: Diagnosis not present

## 2017-11-10 DIAGNOSIS — N401 Enlarged prostate with lower urinary tract symptoms: Secondary | ICD-10-CM | POA: Diagnosis not present

## 2017-11-10 LAB — BLADDER SCAN AMB NON-IMAGING: Scan Result: 50

## 2017-11-10 NOTE — Progress Notes (Signed)
12:49 PM   Cody Price [birth year] [id number]  Referring provider: Madelyn Brunner, MD No address on file  Chief Complaint  Patient presents with  . Benign Prostatic Hypertrophy  . Erectile Dysfunction  . Follow-up    HPI: Patient is a 77 year old [name] with BPH with LUTS and ED who presents today for a one year follow up.    BPH WITH LUTS His IPSS score today is 14, which is moderate lower urinary tract symptomatology. He is terrible with his quality life due to his urinary symptoms.  His PVR is 50 mL.  His major complaints today are frequent urination x4-5, strong urgency, nocturia x1-3, urge incontinence, intermittency and a weak urinary stream.  His previous IPSS score was 1/0.  His previous PVR is 0 mL.  He denies any dysuria, hematuria or suprapubic pain.  He also denies any recent fevers, chills, nausea or vomiting.  He does not have a family history of PCa. IPSS    Row Name 11/10/17 1400         International Prostate Symptom Score   How often have you had the sensation of not emptying your bladder?  Not at All     How often have you had to urinate less than every two hours?  More than half the time     How often have you found you stopped and started again several times when you urinated?  Less than half the time     How often have you found it difficult to postpone urination?  Almost always     How often have you had a weak urinary stream?  Not at All     How often have you had to strain to start urination?  Not at All     How many times did you typically get up at night to urinate?  3 Times     Total IPSS Score  14       Quality of Life due to urinary symptoms   If you were to spend the rest of your life with your urinary condition just the way it is now how would you feel about that?  Terrible        Score:  1-7 Mild 8-19 Moderate 20-35 Severe  Erectile dysfunction He has been having difficulty with erections for several years.   His major  complaint is achieving an erection.  His libido is preserved.   His risk factors for ED are age, hyperlipidemia, diabetes, anticoagulation therapy, hypertension, hypogonadism and BPH.  He denies any painful erections or curvatures with his erections.   He has tried PDE 5 inhibitors in the past, but they have been ineffective.  He was given a prescription for Trimix injections, but he found it cost prohibitive.  He stated that he is just using the Viagra.    Testosterone deficiency Managed by his PCP with testosterone cypionate injections.  PMH: Past Medical History:  Diagnosis Date  . Arthritis    degenerative back   . BPH with obstruction/lower urinary tract symptoms   . CAD (coronary artery disease) 2010   stent  . Diabetes mellitus   . ED (erectile dysfunction)   . Elevated PSA   . GERD (gastroesophageal reflux disease)   . Gross hematuria   . H/O CT scan    recent renal CT due to hematuria, cystoscopy - normal    . Hematuria   . History of hiatal hernia   .  History of kidney stones   . HLD (hyperlipidemia)   . Hypertension   . Ischemic heart disease 1991   with angioplasty  . Kidney stone   . Morbid obesity ([place])   . Myocardial infarction ([place])   . Peripheral neuropathy   . Pernicious anemia   . Pneumonia 1995   /w PE,   . Pulmonary embolism ([place])    previous  . Renal cyst   . Sleep apnea    pt. denies sleep apnea, pt. states he has had 2 studies - last one 2011  . Spinal stenosis     Surgical History: Past Surgical History:  Procedure Laterality Date  . APPENDECTOMY    . BACK SURGERY     x3 back surgeries - /w fusioin, 1- [place]  . bilateral inguinal hernia repair    . cad     stent 2010  . CARDIAC CATHETERIZATION  08/17/2014  . CARPAL TUNNEL RELEASE  2014   bilateral   . CHOLECYSTECTOMY    . LAMINECTOMY      Home Medications:  Allergies as of 11/10/2017      Reactions   Amitriptyline    Lyrica [pregabalin]    Metformin Nausea Only    Neurontin [gabapentin]    Nitroglycerin Other (See Comments)   Makes Blood pressure go to low.    Novocain [procaine]    Procaine Hcl Other (See Comments)   Passes out      Medication List        Accurate as of 11/10/17 11:59 PM. Always use your most recent med list.          clopidogrel 75 MG tablet Commonly known as:  PLAVIX TAKE ONE TABLET BY MOUTH ONCE DAILY   CO Q-10 PO Take 1 capsule by mouth daily.   diltiazem 180 MG 24 hr capsule Commonly known as:  CARDIZEM CD Take 180 mg by mouth daily before breakfast.   glimepiride 4 MG tablet Commonly known as:  AMARYL Take 4 mg by mouth daily. Sometimes pt. Takes this med. Twice daily   hydrochlorothiazide 25 MG tablet Commonly known as:  HYDRODIURIL Take 25 mg by mouth daily.   lisinopril 10 MG tablet Commonly known as:  PRINIVIL,ZESTRIL TAKE ONE TABLET BY MOUTH ONCE DAILY   omeprazole 20 MG capsule Commonly known as:  PRILOSEC Take 20 mg by mouth 2 (two) times daily.   ONE TOUCH ULTRA 2 w/Device Kit Use as instructed.   ONE TOUCH ULTRA TEST test strip Generic drug:  glucose blood   PERCOCET 5-325 MG tablet Generic drug:  oxyCODONE-acetaminophen Take 2 tablets by mouth every 4 (four) hours as needed for moderate pain.   simvastatin 40 MG tablet Commonly known as:  ZOCOR Take 40 mg by mouth daily before breakfast.   testosterone cypionate 200 MG/ML injection Commonly known as:  DEPOTESTOSTERONE CYPIONATE every 30 (thirty) days.   FISH OIL BURP-LESS PO Take by mouth.   TH FISH OIL EXTRA STRENGTH 1200 MG Caps Take 1 tablet by mouth daily.   VALIUM 5 MG tablet Generic drug:  diazepam Take 5 mg by mouth every 12 (twelve) hours as needed for muscle spasms.   VIAGRA 100 MG tablet Generic drug:  sildenafil Take 100 mg by mouth daily as needed for erectile dysfunction.   VITAMIN B-12 IJ Inject 1 vial into the muscle every 30 (thirty) days.   Vitamin D 2000 units Caps Take by mouth daily.        Allergies:  Allergies  Allergen Reactions  . Amitriptyline   . Lyrica [Pregabalin]   . Metformin Nausea Only  . Neurontin [Gabapentin]   . Nitroglycerin Other (See Comments)    Makes Blood pressure go to low.   . Novocain [Procaine]   . Procaine Hcl Other (See Comments)    Passes out    Family History: Family History  Problem Relation Age of Onset  . Heart failure Mother   . Heart disease Father   . Heart attack Brother   . Skin cancer Sister   . Kidney disease Neg Hx   . Prostate cancer Neg Hx   . Kidney cancer Neg Hx   . Bladder Cancer Neg Hx     Social History:  reports that he quit smoking about 32 years ago. He has a 45.00 pack-year smoking history. he has never used smokeless tobacco. He reports that he drinks alcohol. He reports that he does not use drugs.  ROS: UROLOGY Frequent Urination?: Yes Hard to postpone urination?: Yes Burning/pain with urination?: No Get up at night to urinate?: Yes Leakage of urine?: Yes Urine stream starts and stops?: Yes Trouble starting stream?: No Do you have to strain to urinate?: No Blood in urine?: No Urinary tract infection?: No Sexually transmitted disease?: No Injury to kidneys or bladder?: No Painful intercourse?: No Weak stream?: Yes Erection problems?: Yes Penile pain?: No  Gastrointestinal Nausea?: No Vomiting?: No Indigestion/heartburn?: No Diarrhea?: No Constipation?: Yes  Constitutional Fever: No Night sweats?: No Weight loss?: No Fatigue?: No  Skin Skin rash/lesions?: No Itching?: No  Eyes Blurred vision?: No Double vision?: No  Ears/Nose/Throat Sore throat?: No Sinus problems?: No  Hematologic/Lymphatic Swollen glands?: No Easy bruising?: Yes  Cardiovascular Leg swelling?: No Chest pain?: No  Respiratory Cough?: No Shortness of breath?: No  Endocrine Excessive thirst?: No  Musculoskeletal Back pain?: Yes Joint pain?: No  Neurological Headaches?: No Dizziness?:  No  Psychologic Depression?: No Anxiety?: No  Physical Exam: BP (!) 159/68   Pulse 69   Ht '6\' 1"'$  (1.854 m)   Wt 260 lb (117.9 kg)   BMI 34.30 kg/m   Constitutional: Well nourished. Alert and oriented, No acute distress. HEENT: [place] AT, moist mucus membranes. Trachea midline, no masses. Cardiovascular: No clubbing, cyanosis, or edema. Respiratory: Normal respiratory effort, no increased work of breathing. GI: Abdomen is soft, non tender, non distended, no abdominal masses. Liver and spleen not palpable.  No hernias appreciated.  Stool sample for occult testing is not indicated.   GU: No CVA tenderness.  No bladder fullness or masses.  Patient with uncircumcised phallus.  Foreskin easily retracted  Urethral meatus is patent.  No penile discharge. No penile lesions or rashes. Scrotum without lesions, cysts, rashes and/or edema.  Testicles are located scrotally bilaterally. No masses are appreciated in the testicles. Left and right epididymis are normal. Rectal: Patient with  normal sphincter tone. Anus and perineum without scarring or rashes. No rectal masses are appreciated. Prostate (could not palpated entire gland due to buttocks tissue). Skin: No rashes, bruises or suspicious lesions. Lymph: No cervical or inguinal adenopathy. Neurologic: Grossly intact, no focal deficits, moving all 4 extremities. Psychiatric: Normal mood and affect.  Laboratory Data: Lab Results  Component Value Date   WBC 18.9 (H) 11/03/2015   HGB 12.9 (L) 11/03/2015   HCT 36.5 (L) 11/03/2015   MCV 87.2 11/03/2015   PLT 75 (L) 11/03/2015   Lab Results  Component Value Date   CREATININE 0.85 11/03/2015   Lab  Results  Component Value Date   AST 25 11/03/2015   Lab Results  Component Value Date   ALT 14 (L) 11/03/2015    Assessment & Plan:    1. BPH with LUTS  - IPSS score is 14/6, it is worsening  - Continue conservative management, avoiding bladder irritants and timed voiding's  - most  bothersome symptoms is/are frequency, urgency and urge incontinence  - samples given of Myrbetriq 25 mg daily, # 28; I have advised the patient of the side effects of Myrbetriq, such as: elevation in BP, urinary retention and/or HA.  - RTC in 3 weeks for I PSS and PVR   2. Nocturia  - I explained to the patient that nocturia is often multi-factorial and difficult to treat.  Sleeping disorders, heart conditions, peripheral vascular disease, diabetes, an enlarged prostate for men, an urethral stricture causing bladder outlet obstruction and/or certain medications can contribute to nocturia.  - I have suggested that the patient avoid caffeine after noon and alcohol in the evening.  He or she may also benefit from fluid restrictions after 6:00 in the evening and voiding just prior to bedtime.  - I have explained that research studies have showed that over 84% of patients with sleep apnea reported frequent nighttime urination.   With sleep apnea, oxygen decreases, carbon dioxide increases, the blood become more acidic, the heart rate drops and blood vessels in the lung constrict.  The body is then alerted that something is very wrong. The sleeper must wake enough to reopen the airway. By this time, the heart is racing and experiences a false signal of fluid overload. The heart excretes a hormone-like protein that tells the body to get rid of sodium and water, resulting in nocturia.  -  I also informed the patient that a recent study noted that decreasing sodium intake to 2.3 grams daily, if they don't have issues with hyponatremia, can also reduce the number of nightly voids  - The patient may benefit from a discussion with his or her primary care physician to see if he or she has risk factors for sleep apnea or other sleep disturbances and obtaining a sleep study.  3. Erectile dysfunction PDE5i not effective  Not interested in injections at this time  Return in about 3 weeks (around 12/01/2017) for IPSS  and PVR.  These notes generated with voice recognition software. I apologize for typographical errors.  Zara Council, [facility] [address], [place], [place] [address] [phone]

## 2017-12-01 ENCOUNTER — Ambulatory Visit: Payer: Medicare HMO | Admitting: Urology

## 2017-12-05 ENCOUNTER — Ambulatory Visit: Payer: Medicare HMO | Admitting: Urology

## 2018-01-03 ENCOUNTER — Ambulatory Visit: Payer: Medicare HMO | Admitting: Urology

## 2018-02-13 ENCOUNTER — Encounter: Payer: Self-pay | Admitting: Cardiovascular Disease

## 2018-02-13 ENCOUNTER — Ambulatory Visit: Payer: Medicare HMO | Admitting: Cardiovascular Disease

## 2018-02-13 VITALS — BP 120/60 | HR 89 | Ht 73.5 in | Wt 254.0 lb

## 2018-02-13 DIAGNOSIS — E1159 Type 2 diabetes mellitus with other circulatory complications: Secondary | ICD-10-CM

## 2018-02-13 DIAGNOSIS — I2510 Atherosclerotic heart disease of native coronary artery without angina pectoris: Secondary | ICD-10-CM

## 2018-02-13 DIAGNOSIS — R531 Weakness: Secondary | ICD-10-CM | POA: Diagnosis not present

## 2018-02-13 DIAGNOSIS — I480 Paroxysmal atrial fibrillation: Secondary | ICD-10-CM | POA: Diagnosis not present

## 2018-02-13 DIAGNOSIS — E782 Mixed hyperlipidemia: Secondary | ICD-10-CM | POA: Diagnosis not present

## 2018-02-13 DIAGNOSIS — R079 Chest pain, unspecified: Secondary | ICD-10-CM | POA: Diagnosis not present

## 2018-02-13 MED ORDER — APIXABAN 5 MG PO TABS: 5.0000 mg | ORAL_TABLET | Freq: Two times a day (BID) | ORAL | 6 refills | Status: DC

## 2018-02-13 NOTE — Progress Notes (Signed)
Cardiology Office Note  Date:  02/13/2018   ID:  Cody Price, DOB 1940/10/27, MRN [id number]  PCP:  Baxter Hire, MD   Chief Complaint  Patient presents with  . other    12 month follow up. Pt. c/o shortness of breath, burning across chest, chest pain on a scale from 1-10, being a 9, weakness and fatigue.     HPI:  Cody Price is a 77 year old gentleman with a history of  coronary artery disease,  PTCA in 1991 of his RCA that later occluded 100%,  chest pain in September 2010   stent placed to his LAD Stable since that time atrial fibrillation following back surgery  spontaneously converted , previously declined anticoagulation Diabetes, lower extremity neuropathy. s/p back surgery for lumbar spinal stenosis and DJD  unable to tolerate aspirin, GI problems 20 years ago,  stopped the metoprolol on his own as he reported having fatigue.  seen by Dr. Grayland Ormond for low platelets, treated with prednisone Quit smoking in 1987, smoked 20 years Chronically low platelets, avg 60s to 70s presenting for routine follow-up of his coronary artery disease.  In follow-up he reports that over the past 2 weeks he has had new symptoms Chest burning, at rest No energy, gets weak, has to sit no energy to do anything Wants to be put in the hospital did not specific as to his symptoms, just weakness  Seen by primary care last week noted to be in atrial fibrillation, no medication changes made Reports his sugars are running higher No exercise program Denies typical anginal symptoms on exertion, no shortness of breath,  Only chest pain at rest with weakness  Lab work reviewed Sugars 266 HBA1C 7.4 Total chol 89, TSH 1.8  chronic back pain,  Has been seen at [facility], neurosurgery  Walking with a cane  Other past medical history reviewed MRI of his back November 2017 Taking pain medication as well as Tylenol  Compliant with hissimva 40 Total chol 93, LDL 33 HBA1C 6.3  EKG personally  reviewed by myself on todays visit Shows atrial fibrillation ventricular rate 89 bpm no significant ST-T wave changes  Other past medical history He presented to the emergency room August 25 2014 with chest pain. Cardiac enzymes were negative Recovered well from his cardiac catheterization 08/26/2014. Cardiac catheterization was performed for chest pain. This showed an occluded proximal RCA which was chronic, mild to moderate proximal left circumflex, moderate distal left circumflex disease of a small vessel, moderate proximal OM disease moderate size vessel, LAD proximal stent patent, normal ejection fraction. Medical management was recommended.  Since the cardiac catheterization, he denies any further episodes of chest pain. He does have significant fatigue. He is active, Limited in his activities by his back pain but denies any significant shortness of breath or chest pain with exertion.  Minimal lower extremity edema He denies any significant nausea, chest tightness or heaviness. This was his previous anginal equivalent.  lab work 08/26/2014 showing total cholesterol 94, LDL 33, HDL 23  Cardiac catheterization September 2000 and details a 75% proximal LAD lesion, 100% RCA lesion, 30% left circumflex lesion a xience 2.75 x 15 mm DES stent was placed.  PMH:   has a past medical history of Arthritis, BPH with obstruction/lower urinary tract symptoms, CAD (coronary artery disease) (2010), Diabetes mellitus, ED (erectile dysfunction), Elevated PSA, GERD (gastroesophageal reflux disease), Gross hematuria, H/O CT scan, Hematuria, History of hiatal hernia, History of kidney stones, HLD (hyperlipidemia), Hypertension, Ischemic heart disease (1991),  Kidney stone, Morbid obesity ([place]), Myocardial infarction [facility]), Peripheral neuropathy, Pernicious anemia, Pneumonia (1995), Pulmonary embolism ([place]), Renal cyst, Sleep apnea, and Spinal stenosis.  PSH:    Past Surgical History:  Procedure  Laterality Date  . APPENDECTOMY    . BACK SURGERY     x3 back surgeries - /w fusioin, 1- [place]  . bilateral inguinal hernia repair    . cad     stent 2010  . CARDIAC CATHETERIZATION  08/17/2014  . CARPAL TUNNEL RELEASE  2014   bilateral   . CHOLECYSTECTOMY    . LAMINECTOMY      Current Outpatient Medications  Medication Sig Dispense Refill  . Blood Glucose Monitoring Suppl (ONE TOUCH ULTRA 2) w/Device KIT Use as instructed.    . Cholecalciferol (VITAMIN D) 2000 UNITS CAPS Take by mouth daily.      . clopidogrel (PLAVIX) 75 MG tablet TAKE ONE TABLET BY MOUTH ONCE DAILY 90 tablet 2  . Coenzyme Q10 (CO Q-10 PO) Take 1 capsule by mouth daily.     . Cyanocobalamin (VITAMIN B-12 IJ) Inject 1 vial into the muscle every 30 (thirty) days.     . diazepam (VALIUM) 5 MG tablet Take 5 mg by mouth every 12 (twelve) hours as needed for muscle spasms.     Marland Kitchen diltiazem (CARDIZEM CD) 180 MG 24 hr capsule Take 180 mg by mouth daily before breakfast.     . glimepiride (AMARYL) 4 MG tablet Take 4 mg by mouth daily. Sometimes pt. Takes this med. Twice daily    . glucose blood (ONE TOUCH ULTRA TEST) test strip     . hydrochlorothiazide 25 MG tablet Take 25 mg by mouth daily.      Marland Kitchen lisinopril (PRINIVIL,ZESTRIL) 10 MG tablet TAKE ONE TABLET BY MOUTH ONCE DAILY 90 tablet 2  . Omega-3 Fatty Acids (TH FISH OIL EXTRA STRENGTH) 1200 MG CAPS Take 1 tablet by mouth daily.     Marland Kitchen omeprazole (PRILOSEC) 20 MG capsule Take 20 mg by mouth 2 (two) times daily.     Marland Kitchen oxyCODONE-acetaminophen (PERCOCET) 10-325 MG tablet Take 1 tablet by mouth every 4 (four) hours as needed.     . sildenafil (VIAGRA) 100 MG tablet Take 100 mg by mouth daily as needed for erectile dysfunction.     . simvastatin (ZOCOR) 40 MG tablet Take 40 mg by mouth daily before breakfast.     . testosterone cypionate (DEPOTESTOSTERONE CYPIONATE) 200 MG/ML injection every 30 (thirty) days.     No current facility-administered medications for  this visit.      Allergies:   Amitriptyline; Lyrica [pregabalin]; Metformin; Neurontin [gabapentin]; Nitroglycerin; Novocain [procaine]; and Procaine hcl   Social History:  The patient  reports that he quit smoking about 32 years ago. He has a 45.00 pack-year smoking history. He has never used smokeless tobacco. He reports that he drinks alcohol. He reports that he does not use drugs.   Family History:   family history includes Heart attack in his brother; Heart disease in his father; Heart failure in his mother; Skin cancer in his sister.    Review of Systems: Review of Systems  Constitutional: Positive for malaise/fatigue.       Weakness  Respiratory: Negative.   Cardiovascular: Positive for chest pain.  Gastrointestinal: Negative.   Musculoskeletal: Positive for back pain.  Neurological: Negative.        Neuropathy lower extremities  Psychiatric/Behavioral: Negative.   All other systems reviewed and are negative.  PHYSICAL EXAM: VS:  BP 120/60 (BP Location: Left Arm, Patient Position: Sitting, Cuff Size: Normal)   Pulse 89   Ht 6' 1.5" (1.867 m)   Wt 254 lb (115.2 kg)   BMI 33.06 kg/m  , BMI Body mass index is 33.06 kg/m.  Constitutional:  oriented to person, place, and time. No distress.  HENT:  Head: Normocephalic and atraumatic.  Eyes:  no discharge. No scleral icterus.  Neck: Normal range of motion. Neck supple. No JVD present.  Cardiovascular: irregularly irregular, normal heart sounds and intact distal pulses. Exam reveals no gallop and no friction rub. No edema No murmur heard. Pulmonary/Chest: Effort normal and breath sounds normal. No stridor. No respiratory distress.  no wheezes.  no rales.  no tenderness.  Abdominal: Soft.  no distension.  no tenderness.  Musculoskeletal: Normal range of motion.  no  tenderness or deformity.  Neurological:  normal muscle tone. Coordination normal. No atrophy Skin: Skin is warm and dry. No rash noted. not diaphoretic.   Psychiatric:  normal mood and affect. behavior is normal. Thought content normal.    Recent Labs: No results found for requested labs within last 8760 hours.    Lipid Panel Lab Results  Component Value Date   CHOL 94 08/26/2014   HDL 23 (L) 08/26/2014   LDLCALC 33 08/26/2014   TRIG 191 08/26/2014      Wt Readings from Last 3 Encounters:  02/13/18 254 lb (115.2 kg)  11/10/17 260 lb (117.9 kg)  03/28/17 256 lb (116.1 kg)       ASSESSMENT AND PLAN:  Atrial fibrillation, unspecified type ([place]) - Plan: EKG 12-Lead Previously declined anticoagulation Appears to be in atrial fibrillation today Long discussion with him, this could be possibly causing his weakness Suggested he stop the Plavix and we will start eliquis 5 mg twice a day Recommend we meet again in one month to discuss getting him back to normal sinus rhythm  Mixed hyperlipidemia Cholesterol is at goal on the current lipid regimen. No changes to the medications were made. stable  Type 2 diabetes mellitus with other circulatory complication, without long-term current use of insulin (HCC)  Recommended low carbohydrate diet A1c elevated, will defer management to primary care Unable to exercise  Thrombocytopenia [facility]) previously seen by hematology at [facility]. Numbers are stable Numbers around 60-70  Atherosclerosis of native coronary artery of native heart without angina pectoris Take symptoms of chest pain arrest and weakness possibly from atrial fibrillation no unable to exclude ischemia We have recommended pharmacologic Myoview as he is unable to treadmill  Neuropathy Management per primary care stable   Total encounter time more than 45 minutes  Greater than 50% was spent in counseling and coordination of care with the patient   Disposition:   F/U  1 months   No orders of the defined types were placed in this encounter.    Signed, Esmond Plants, M.D., Ph.D. 02/13/2018  [place], [place]

## 2018-02-13 NOTE — Patient Instructions (Addendum)
You are in atrial fibrillation today  Medication Instructions:   Stop the plavix Start eliquis 5 mg twice a day  Labwork:  No new labs needed  Testing/Procedures:  We will order a lexiscan myoview for chest pain, CAD, angina  [name]  Your caregiver has ordered a Stress Test with nuclear imaging. The purpose of this test is to evaluate the blood supply to your heart muscle. This procedure is referred to as a "Non-Invasive Stress Test." This is because other than having an IV started in your vein, nothing is inserted or "invades" your body. Cardiac stress tests are done to find areas of poor blood flow to the heart by determining the extent of coronary artery disease (CAD). Some patients exercise on a treadmill, which naturally increases the blood flow to your heart, while others who are  unable to walk on a treadmill due to physical limitations have a pharmacologic/chemical stress agent called Lexiscan . This medicine will mimic walking on a treadmill by temporarily increasing your coronary blood flow.   Please note: these test may take anywhere between 2-4 hours to complete  PLEASE REPORT TO [place] AT THE FIRST DESK WILL DIRECT YOU WHERE TO GO  Date of Procedure: Tuesday 02/14/18  Arrival Time for Procedure: 8:45 am   Instructions regarding medication:   _x___ : Hold diabetes medication morning of procedure (glimepiride)   _x___:  Hold other medications as follows: diltiazem- the morning of your procedure  PLEASE NOTIFY THE OFFICE AT LEAST 24 HOURS IN ADVANCE IF YOU ARE UNABLE TO KEEP YOUR APPOINTMENT.  [phone] AND  PLEASE NOTIFY NUCLEAR MEDICINE AT [facility] AT LEAST 24 HOURS IN ADVANCE IF YOU ARE UNABLE TO KEEP YOUR APPOINTMENT. [phone]  How to prepare for your Myoview test:  1. Do not eat or drink after midnight 2. No caffeine for 24 hours prior to test 3. No smoking 24 hours prior to test. 4. Your medication may be taken  with water.  If your doctor stopped a medication because of this test, do not take that medication. 5. Ladies, please do not wear dresses.  Skirts or pants are appropriate. Please wear a short sleeve shirt. 6. No perfume, cologne or lotion. 7. Wear comfortable walking shoes. No heels!   Follow-Up: It was a pleasure seeing you in the office today. Please call us if you have new issues that need to be addressed before your next appt.  [phone]  Your physician wants you to follow-up in: 1 month.   If you need a refill on your cardiac medications before your next appointment, please call your pharmacy.  For educational health videos Log in to : [email or web address] Or : [email or web address], password : [email or web address]

## 2018-02-14 ENCOUNTER — Encounter
Admission: RE | Admit: 2018-02-14 | Discharge: 2018-02-14 | Disposition: A | Discharge status: Home / Self Care | Payer: Medicare HMO | Source: Ambulatory Visit | Attending: Cardiovascular Disease | Admitting: Cardiovascular Disease

## 2018-02-14 DIAGNOSIS — R079 Chest pain, unspecified: Secondary | ICD-10-CM | POA: Diagnosis not present

## 2018-02-14 MED ORDER — REGADENOSON 0.4 MG/5ML IV SOLN
0.4000 mg | Freq: Once | INTRAVENOUS | Status: AC
  Administered 2018-02-14: 0.4 mg via INTRAVENOUS

## 2018-02-14 MED ORDER — TECHNETIUM TC 99M TETROFOSMIN IV KIT
30.0000 | PACK | Freq: Once | INTRAVENOUS | Status: AC | PRN
  Administered 2018-02-14: 30.5 via INTRAVENOUS

## 2018-02-14 MED ORDER — TECHNETIUM TC 99M TETROFOSMIN IV KIT
13.0000 | PACK | Freq: Once | INTRAVENOUS | Status: AC | PRN
  Administered 2018-02-14: 13 via INTRAVENOUS

## 2018-02-15 ENCOUNTER — Telehealth: Payer: Self-pay | Admitting: Cardiovascular Disease

## 2018-02-15 LAB — NM MYOCAR MULTI W/SPECT W/WALL MOTION / EF
CHL CUP NUCLEAR SRS: 8
CHL CUP RESTING HR STRESS: 86 {beats}/min
CSEPHR: 75 %
LV dias vol: 144 mL (ref 62–150)
LVSYSVOL: 54 mL
Peak HR: 109 {beats}/min
SDS: 1
SSS: 2
TID: 1.46

## 2018-02-15 NOTE — Telephone Encounter (Signed)
Please call pt with stress test results.

## 2018-02-15 NOTE — Telephone Encounter (Signed)
I called and advised the patient's wife his stress test has not been resulted yet and we will call with results once Dr. Rockey Situ signs off.  She voices understanding.

## 2018-03-18 NOTE — Progress Notes (Signed)
Cardiology Office Note  Date:  03/20/2018   ID:  RIGGS DINEEN, DOB 06-06-1941, MRN [id number]  PCP:  Baxter Hire, MD   Chief Complaint  Patient presents with  . other    1 month follow up and discuss Myoview. Meds reviewed by the pt. verbally. Pt. c/o chest pain and shortness of breath that comes and goes; symptoms for about 1 month.     HPI:  Mr. Forti is a 77 year old gentleman with a history of  coronary artery disease,  PTCA in 1991 of his RCA that later occluded 100%,  chest pain in September 2010   stent placed to his LAD Stable since that time atrial fibrillation following back surgery  spontaneously converted , previously declined anticoagulation Diabetes, lower extremity neuropathy. s/p back surgery for lumbar spinal stenosis and DJD  unable to tolerate aspirin, GI problems 20 years ago,  stopped the metoprolol on his own as he reported having fatigue.  seen by Dr. Grayland Ormond for low platelets, treated with prednisone Quit smoking in 1987, smoked 20 years Chronically low platelets, avg 60s to 70s presenting for routine follow-up of his coronary artery disease.  On his last clinic visit he reported having chest pain and general malaise Stress test was ordered Stress test showed no significant  Ischemia Significant GI uptake artifact fixed inferior wall perfusion defect consistent with prior inferior wall MI dating back to 1991 Normal wall motion, EF estimated at 49% No EKG changes concerning for ischemia at peak stress or in recovery. Rhythm is atrial fibrillation with PVCs Low risk scan  Reports he continues to have atypical type chest pain Up at 5 AM,doesn't long day out on his property Generally feels tired Muscle aches, In his chest especially when bending over and reaching up to pick up something Hurts in the office  Today even sitting on the table, pushes on his lower ribs Active with mowing and planting his garden  Lab work reviewed with him  on B12  shots through primary care HBA1C 7.4 up from 6.4 Trying to change his diet Total chol 89, LDL 34 TSH 1.8  permanent Atrial fib since 2017. Previously declined anticoagulation Plavix held and started on anticoagulation on last clinic visit Tolerating eliquis  chronic back pain,  Has been seen at [facility], neurosurgery  Walking with a cane  EKG personally reviewed by myself on todays visit Shows atrial fibrillation with rate 90 bpmPVCs old inferior MI  Other past medical history reviewed MRI of his back November 2017 Taking pain medication as well as Tylenol  He presented to the emergency room August 25 2014 with chest pain. Cardiac enzymes were negative Recovered well from his cardiac catheterization 08/26/2014. Cardiac catheterization was performed for chest pain. This showed an occluded proximal RCA which was chronic, mild to moderate proximal left circumflex, moderate distal left circumflex disease of a small vessel, moderate proximal OM disease moderate size vessel, LAD proximal stent patent, normal ejection fraction. Medical management was recommended.  Since the cardiac catheterization, he denies any further episodes of chest pain. He does have significant fatigue. He is active, Limited in his activities by his back pain but denies any significant shortness of breath or chest pain with exertion.  Minimal lower extremity edema He denies any significant nausea, chest tightness or heaviness. This was his previous anginal equivalent.  lab work 08/26/2014 showing total cholesterol 94, LDL 33, HDL 23  Cardiac catheterization September 2000 and details a 75% proximal LAD lesion, 100% RCA lesion, 30%  left circumflex lesion a xience 2.75 x 15 mm DES stent was placed.  PMH:   has a past medical history of Arthritis, BPH with obstruction/lower urinary tract symptoms, CAD (coronary artery disease) (2010), Diabetes mellitus, ED (erectile dysfunction), Elevated PSA, GERD (gastroesophageal  reflux disease), Gross hematuria, H/O CT scan, Hematuria, History of hiatal hernia, History of kidney stones, HLD (hyperlipidemia), Hypertension, Ischemic heart disease (1991), Kidney stone, Morbid obesity ([place]), Myocardial infarction [facility]), Peripheral neuropathy, Pernicious anemia, Pneumonia (1995), Pulmonary embolism ([place]), Renal cyst, Sleep apnea, and Spinal stenosis.  PSH:    Past Surgical History:  Procedure Laterality Date  . APPENDECTOMY    . BACK SURGERY     x3 back surgeries - /w fusioin, 1- [place]  . bilateral inguinal hernia repair    . cad     stent 2010  . CARDIAC CATHETERIZATION  08/17/2014  . CARPAL TUNNEL RELEASE  2014   bilateral   . CHOLECYSTECTOMY    . LAMINECTOMY      Current Outpatient Medications  Medication Sig Dispense Refill  . apixaban (ELIQUIS) 5 MG TABS tablet Take 1 tablet (5 mg total) by mouth 2 (two) times daily. 60 tablet 6  . Blood Glucose Monitoring Suppl (ONE TOUCH ULTRA 2) w/Device KIT Use as instructed.    . Cholecalciferol (VITAMIN D) 2000 UNITS CAPS Take by mouth daily.      . Coenzyme Q10 (CO Q-10 PO) Take 1 capsule by mouth daily.     . Cyanocobalamin (VITAMIN B-12 IJ) Inject 1 vial into the muscle every 30 (thirty) days.     . diazepam (VALIUM) 5 MG tablet Take 5 mg by mouth every 12 (twelve) hours as needed for muscle spasms.     Marland Kitchen diltiazem (CARDIZEM CD) 180 MG 24 hr capsule Take 180 mg by mouth daily before breakfast.     . glimepiride (AMARYL) 4 MG tablet Take 4 mg by mouth daily. Sometimes pt. Takes this med. Twice daily    . glucose blood (ONE TOUCH ULTRA TEST) test strip     . hydrochlorothiazide 25 MG tablet Take 25 mg by mouth daily.      Marland Kitchen lisinopril (PRINIVIL,ZESTRIL) 10 MG tablet TAKE ONE TABLET BY MOUTH ONCE DAILY 90 tablet 2  . Omega-3 Fatty Acids (TH FISH OIL EXTRA STRENGTH) 1200 MG CAPS Take 1 tablet by mouth daily.     Marland Kitchen omeprazole (PRILOSEC) 20 MG capsule Take 20 mg by mouth 2 (two) times daily.     Marland Kitchen  oxyCODONE-acetaminophen (PERCOCET) 10-325 MG tablet Take 1 tablet by mouth every 4 (four) hours as needed.     . sildenafil (VIAGRA) 100 MG tablet Take 100 mg by mouth daily as needed for erectile dysfunction.     . simvastatin (ZOCOR) 40 MG tablet Take 40 mg by mouth daily before breakfast.     . testosterone cypionate (DEPOTESTOSTERONE CYPIONATE) 200 MG/ML injection every 30 (thirty) days.     No current facility-administered medications for this visit.      Allergies:   Amitriptyline; Lyrica [pregabalin]; Metformin; Neurontin [gabapentin]; Nitroglycerin; Novocain [procaine]; and Procaine hcl   Social History:  The patient  reports that he quit smoking about 32 years ago. He has a 45.00 pack-year smoking history. He has never used smokeless tobacco. He reports that he drinks alcohol. He reports that he does not use drugs.   Family History:   family history includes Heart attack in his brother; Heart disease in his father; Heart failure in his  mother; Skin cancer in his sister.    Review of Systems: Review of Systems  Constitutional: Positive for malaise/fatigue.       Weakness  Respiratory: Negative.   Cardiovascular: Positive for chest pain.  Gastrointestinal: Negative.   Musculoskeletal: Positive for back pain.  Neurological: Negative.        Neuropathy lower extremities  Psychiatric/Behavioral: Negative.   All other systems reviewed and are negative.   PHYSICAL EXAM: VS:  BP 140/60 (BP Location: Left Arm, Patient Position: Sitting, Cuff Size: Normal)   Pulse 90   Ht '6\' 1"'  (1.854 m)   Wt 254 lb 8 oz (115.4 kg)   BMI 33.58 kg/m  , BMI Body mass index is 33.58 kg/m. Constitutional:  oriented to person, place, and time. No distress.  HENT:  Head: Normocephalic and atraumatic.  Eyes:  no discharge. No scleral icterus.  Neck: Normal range of motion. Neck supple. No JVD present.  Cardiovascular: irregularly irregular, normal heart sounds and intact distal pulses. Exam reveals  no gallop and no friction rub. No edema No murmur heard. Pulmonary/Chest: Effort normal and breath sounds normal. No stridor. No respiratory distress.  no wheezes.  no rales.  no tenderness.  Abdominal: Soft.  no distension.  no tenderness.  Musculoskeletal: Normal range of motion.  no  tenderness or deformity.  Neurological:  normal muscle tone. Coordination normal. No atrophy Skin: Skin is warm and dry. No rash noted. not diaphoretic.  Psychiatric:  normal mood and affect. behavior is normal. Thought content normal.    Recent Labs: No results found for requested labs within last 8760 hours.    Lipid Panel Lab Results  Component Value Date   CHOL 94 08/26/2014   HDL 23 (L) 08/26/2014   LDLCALC 33 08/26/2014   TRIG 191 08/26/2014      Wt Readings from Last 3 Encounters:  03/20/18 254 lb 8 oz (115.4 kg)  02/13/18 254 lb (115.2 kg)  11/10/17 260 lb (117.9 kg)       ASSESSMENT AND PLAN:  Atrial fibrillation, unspecified type ([place]) - Plan: EKG 12-Lead Tolerating anticoagulation Plavix previously held Permanent atrial fibrillation, we'll continue to monitor rate  Mixed hyperlipidemia Cholesterol is at goal on the current lipid regimen. No changes to the medications were made. stable  Type 2 diabetes mellitus with other circulatory complication, without long-term current use of insulin (HCC)  Recommended low carbohydrate diet A1c elevated, he seems surprised No regular exercise though only working in his yard  Thrombocytopenia [facility]) previously seen by hematology at [facility]. Numbers around 60-70. Numbers have been stable  Atherosclerosis of native coronary artery of native heart without angina pectoris Long discussion concerning his Myoview Fixed inferior wall defect with no other significant ischemia He is having atypical chest pain Recommended he try to hold the simvastatin for several weeks to see if this helps his discomfort Unable to exclude myalgias Less likely  GERD. Always seems to present at rest with movement reaching upwards and bending over  Neuropathy Management per primary care Stable. Chronic pain   Total encounter time more than 25 minutes  Greater than 50% was spent in counseling and coordination of care with the patient   Disposition:   F/U  1 months   Orders Placed This Encounter  Procedures  . EKG 12-Lead     Signed, Esmond Plants, M.D., Ph.D. 03/20/2018  [place], [place]

## 2018-03-20 ENCOUNTER — Encounter: Payer: Self-pay | Admitting: Cardiovascular Disease

## 2018-03-20 ENCOUNTER — Ambulatory Visit: Payer: Medicare HMO | Admitting: Cardiovascular Disease

## 2018-03-20 VITALS — BP 140/60 | HR 90 | Ht 73.0 in | Wt 254.5 lb

## 2018-03-20 DIAGNOSIS — R531 Weakness: Secondary | ICD-10-CM

## 2018-03-20 DIAGNOSIS — E782 Mixed hyperlipidemia: Secondary | ICD-10-CM | POA: Diagnosis not present

## 2018-03-20 DIAGNOSIS — D696 Thrombocytopenia, unspecified: Secondary | ICD-10-CM | POA: Diagnosis not present

## 2018-03-20 DIAGNOSIS — I480 Paroxysmal atrial fibrillation: Secondary | ICD-10-CM

## 2018-03-20 DIAGNOSIS — E1159 Type 2 diabetes mellitus with other circulatory complications: Secondary | ICD-10-CM

## 2018-03-20 DIAGNOSIS — I25118 Atherosclerotic heart disease of native coronary artery with other forms of angina pectoris: Secondary | ICD-10-CM | POA: Diagnosis not present

## 2018-03-20 NOTE — Patient Instructions (Addendum)
Medication Instructions:   No medication changes made  Ok to hold simvastatin for a few weeks to see if chest pain gets better  Labwork:  No new labs needed  Testing/Procedures:  No further testing at this time   Follow-Up: It was a pleasure seeing you in the office today. Please call us if you have new issues that need to be addressed before your next appt.  [phone]  Your physician wants you to follow-up in: 12 months.  You will receive a reminder letter in the mail two months in advance. If you don't receive a letter, please call our office to schedule the follow-up appointment.  If you need a refill on your cardiac medications before your next appointment, please call your pharmacy.  For educational health videos Log in to : [email or web address] Or : [email or web address], password : [email or web address]

## 2018-05-22 ENCOUNTER — Other Ambulatory Visit: Payer: Self-pay | Admitting: Student

## 2018-05-22 DIAGNOSIS — R634 Abnormal weight loss: Secondary | ICD-10-CM

## 2018-05-22 DIAGNOSIS — R1084 Generalized abdominal pain: Secondary | ICD-10-CM

## 2018-05-22 DIAGNOSIS — R630 Anorexia: Secondary | ICD-10-CM

## 2018-05-22 DIAGNOSIS — R112 Nausea with vomiting, unspecified: Secondary | ICD-10-CM

## 2018-05-25 ENCOUNTER — Ambulatory Visit
Admission: RE | Admit: 2018-05-25 | Discharge: 2018-05-25 | Disposition: A | Discharge status: Home / Self Care | Payer: Medicare HMO | Source: Ambulatory Visit | Attending: Student | Admitting: Student

## 2018-05-29 ENCOUNTER — Telehealth: Payer: Self-pay | Admitting: Cardiovascular Disease

## 2018-05-29 NOTE — Telephone Encounter (Signed)
° °  [facility] Pre-operative Risk Assessment    Request for surgical clearance:  1. What type of surgery is being performed? Colonoscopy and Upper Endoscopy   2. When is this surgery scheduled? 07/17/18  3. What type of clearance is required (medical clearance vs. Pharmacy clearance to hold med vs. Both)? Both   4. Are there any medications that need to be held prior to surgery and how long? Eliquis to hold 3 days before   5. Practice name and name of physician performing surgery? [facility] Tammi Klippel PA   6. What is your office phone number [phone]   7.   What is your office fax number [phone]  8.   Anesthesia type (None, local, MAC, general) ?

## 2018-05-29 NOTE — Telephone Encounter (Signed)
Last myoview 02/14/18. Last office visit 03/20/18. On Eliquis.

## 2018-05-31 ENCOUNTER — Ambulatory Visit
Admission: RE | Admit: 2018-05-31 | Discharge: 2018-05-31 | Disposition: A | Discharge status: Home / Self Care | Payer: Medicare HMO | Source: Ambulatory Visit | Attending: Student | Admitting: Student

## 2018-05-31 DIAGNOSIS — N400 Enlarged prostate without lower urinary tract symptoms: Secondary | ICD-10-CM | POA: Insufficient documentation

## 2018-05-31 DIAGNOSIS — K5710 Diverticulosis of small intestine without perforation or abscess without bleeding: Secondary | ICD-10-CM | POA: Diagnosis not present

## 2018-05-31 DIAGNOSIS — R630 Anorexia: Secondary | ICD-10-CM | POA: Insufficient documentation

## 2018-05-31 DIAGNOSIS — R112 Nausea with vomiting, unspecified: Secondary | ICD-10-CM | POA: Insufficient documentation

## 2018-05-31 DIAGNOSIS — R1084 Generalized abdominal pain: Secondary | ICD-10-CM | POA: Insufficient documentation

## 2018-05-31 DIAGNOSIS — R634 Abnormal weight loss: Secondary | ICD-10-CM | POA: Insufficient documentation

## 2018-05-31 DIAGNOSIS — I2510 Atherosclerotic heart disease of native coronary artery without angina pectoris: Secondary | ICD-10-CM | POA: Diagnosis not present

## 2018-05-31 DIAGNOSIS — R161 Splenomegaly, not elsewhere classified: Secondary | ICD-10-CM | POA: Insufficient documentation

## 2018-05-31 DIAGNOSIS — I313 Pericardial effusion (noninflammatory): Secondary | ICD-10-CM | POA: Diagnosis not present

## 2018-05-31 MED ORDER — IOPAMIDOL (ISOVUE-300) INJECTION 61%
100.0000 mL | Freq: Once | INTRAVENOUS | Status: AC | PRN
  Administered 2018-05-31: 100 mL via INTRAVENOUS

## 2018-06-01 ENCOUNTER — Encounter: Payer: Self-pay | Admitting: Urology

## 2018-06-01 ENCOUNTER — Ambulatory Visit: Payer: Medicare HMO | Admitting: Urology

## 2018-06-01 VITALS — BP 144/75 | HR 93 | Resp 16 | Ht 73.0 in | Wt 247.0 lb

## 2018-06-01 DIAGNOSIS — N138 Other obstructive and reflux uropathy: Secondary | ICD-10-CM | POA: Diagnosis not present

## 2018-06-01 DIAGNOSIS — N529 Male erectile dysfunction, unspecified: Secondary | ICD-10-CM | POA: Diagnosis not present

## 2018-06-01 DIAGNOSIS — N401 Enlarged prostate with lower urinary tract symptoms: Secondary | ICD-10-CM

## 2018-06-01 NOTE — Progress Notes (Signed)
2:14 PM   Cody Price 08-Dec-1940 [id number]  Referring provider: Madelyn Brunner, MD No address on file  Chief Complaint  Patient presents with  . Benign Prostatic Hypertrophy    HPI: Patient is a 77 year old [name] with BPH with LUTS and ED who presents today for a one year follow up.    BPH WITH LUTS His IPSS score today is 4, which is mild lower urinary tract symptomatology.  He is delighted with his quality life due to his urinary symptoms.   His major complaints today are nocturia x 1-3.  His previous IPSS score was 14/5.  His previous PVR is 50 mL.  He denies any dysuria, hematuria or suprapubic pain.  He also denies any recent fevers, chills, nausea or vomiting.  He does not have a family history of PCa. IPSS    Row Name 06/01/18 1300         International Prostate Symptom Score   How often have you had the sensation of not emptying your bladder?  Not at All     How often have you had to urinate less than every two hours?  Less than half the time     How often have you found you stopped and started again several times when you urinated?  Less than 1 in 5 times     How often have you found it difficult to postpone urination?  Not at All     How often have you had a weak urinary stream?  Not at All     How often have you had to strain to start urination?  Not at All     How many times did you typically get up at night to urinate?  1 Time     Total IPSS Score  4       Quality of Life due to urinary symptoms   If you were to spend the rest of your life with your urinary condition just the way it is now how would you feel about that?  Delighted        Score:  1-7 Mild 8-19 Moderate 20-35 Severe  Erectile dysfunction SHIM score is 15, mild to moderate.  He has been having difficulty with erections for several years.   His major complaint is achieving an erection.  His libido is preserved.   His risk factors for ED are age, hyperlipidemia, diabetes,  anticoagulation therapy, hypertension, testosterone deficiency and BPH.  He denies any painful erections or curvatures with his erections.   He has tried PDE 5 inhibitors in the past, but they have been ineffective.  He was given a prescription for Trimix injections, but he found it cost prohibitive.  He stated that he is just using the Viagra. SHIM    Row Name 06/01/18 1338         SHIM: Over the last 6 months:   How do you rate your confidence that you could get and keep an erection?  Very Low     When you had erections with sexual stimulation, how often were your erections hard enough for penetration (entering your partner)?  Sometimes (about half the time)     During sexual intercourse, how often were you able to maintain your erection after you had penetrated (entered) your partner?  Most Times (much more than half the time)     During sexual intercourse, how difficult was it to maintain your erection to completion of  intercourse?  Difficult     When you attempted sexual intercourse, how often was it satisfactory for you?  Most Times (much more than half the time)       SHIM Total Score   SHIM  15         Testosterone deficiency Managed by his PCP with testosterone cypionate injections.  PMH: Past Medical History:  Diagnosis Date  . Arthritis    degenerative back   . BPH with obstruction/lower urinary tract symptoms   . CAD (coronary artery disease) 2010   stent  . Diabetes mellitus   . ED (erectile dysfunction)   . Elevated PSA   . GERD (gastroesophageal reflux disease)   . Gross hematuria   . H/O CT scan    recent renal CT due to hematuria, cystoscopy - normal    . Hematuria   . History of hiatal hernia   . History of kidney stones   . HLD (hyperlipidemia)   . Hypertension   . Ischemic heart disease 1991   with angioplasty  . Kidney stone   . Morbid obesity ([place])   . Myocardial infarction ([place])   . Peripheral neuropathy   . Pernicious anemia   . Pneumonia 1995     /w PE,   . Pulmonary embolism ([place])    previous  . Renal cyst   . Sleep apnea    pt. denies sleep apnea, pt. states he has had 2 studies - last one 2011  . Spinal stenosis     Surgical History: Past Surgical History:  Procedure Laterality Date  . APPENDECTOMY    . BACK SURGERY     x3 back surgeries - /w fusioin, 1- [facility]  . bilateral inguinal hernia repair    . cad     stent 2010  . CARDIAC CATHETERIZATION  08/17/2014  . CARPAL TUNNEL RELEASE  2014   bilateral   . CHOLECYSTECTOMY    . LAMINECTOMY      Home Medications:  Allergies as of 06/01/2018      Reactions   Amitriptyline    Lyrica [pregabalin]    Metformin Nausea Only   Neurontin [gabapentin]    Nitroglycerin Other (See Comments)   Makes Blood pressure go to low.    Novocain [procaine]    Procaine Hcl Other (See Comments)   Passes out      Medication List        Accurate as of 06/01/18  2:14 PM. Always use your most recent med list.          apixaban 5 MG Tabs tablet Commonly known as:  ELIQUIS Take 1 tablet (5 mg total) by mouth 2 (two) times daily.   CO Q-10 PO Take 1 capsule by mouth daily.   diltiazem 180 MG 24 hr capsule Commonly known as:  CARDIZEM CD Take 180 mg by mouth daily before breakfast.   glimepiride 4 MG tablet Commonly known as:  AMARYL Take 4 mg by mouth daily. Sometimes pt. Takes this med. Twice daily   hydrochlorothiazide 25 MG tablet Commonly known as:  HYDRODIURIL Take 25 mg by mouth daily.   lisinopril 10 MG tablet Commonly known as:  PRINIVIL,ZESTRIL TAKE ONE TABLET BY MOUTH ONCE DAILY   omeprazole 20 MG capsule Commonly known as:  PRILOSEC Take 20 mg by mouth 2 (two) times daily.   ONE TOUCH ULTRA 2 w/Device Kit Use as instructed.   ONE TOUCH ULTRA TEST test strip Generic drug:  glucose blood  oxyCODONE-acetaminophen 10-325 MG tablet Commonly known as:  PERCOCET Take 1 tablet by mouth every 4 (four) hours as needed.   simvastatin 40 MG  tablet Commonly known as:  ZOCOR Take 40 mg by mouth daily before breakfast.   sucralfate 1 g tablet Commonly known as:  CARAFATE DISSOLVE 1 TABLET IN 1 TABLESPOON OF WATER AND TAKE BY MOUTH 3 TIMES A DAY. HOLD VITAMIN D.   testosterone cypionate 200 MG/ML injection Commonly known as:  DEPOTESTOSTERONE CYPIONATE every 30 (thirty) days.   TH FISH OIL EXTRA STRENGTH 1200 MG Caps Take 1 tablet by mouth daily.   VALIUM 5 MG tablet Generic drug:  diazepam Take 5 mg by mouth every 12 (twelve) hours as needed for muscle spasms.   VIAGRA 100 MG tablet Generic drug:  sildenafil Take 100 mg by mouth daily as needed for erectile dysfunction.   VITAMIN B-12 IJ Inject 1 vial into the muscle every 30 (thirty) days.   Vitamin D 2000 units Caps Take by mouth daily.       Allergies:  Allergies  Allergen Reactions  . Amitriptyline   . Lyrica [Pregabalin]   . Metformin Nausea Only  . Neurontin [Gabapentin]   . Nitroglycerin Other (See Comments)    Makes Blood pressure go to low.   . Novocain [Procaine]   . Procaine Hcl Other (See Comments)    Passes out    Family History: Family History  Problem Relation Age of Onset  . Heart failure Mother   . Heart disease Father   . Heart attack Brother   . Skin cancer Sister   . Kidney disease Neg Hx   . Prostate cancer Neg Hx   . Kidney cancer Neg Hx   . Bladder Cancer Neg Hx     Social History:  reports that he quit smoking about 32 years ago. He has a 45.00 pack-year smoking history. He has quit using smokeless tobacco. He reports that he drinks alcohol. He reports that he does not use drugs.  ROS: UROLOGY Frequent Urination?: No Hard to postpone urination?: No Burning/pain with urination?: No Get up at night to urinate?: Yes Leakage of urine?: No Urine stream starts and stops?: No Trouble starting stream?: No Do you have to strain to urinate?: No Blood in urine?: No Urinary tract infection?: No Sexually transmitted  disease?: No Injury to kidneys or bladder?: No Painful intercourse?: No Weak stream?: No Erection problems?: Yes Penile pain?: No  Gastrointestinal Nausea?: No Vomiting?: No Indigestion/heartburn?: No Diarrhea?: No Constipation?: No  Constitutional Fever: No Night sweats?: No Weight loss?: No Fatigue?: No  Skin Skin rash/lesions?: Yes Itching?: No  Eyes Blurred vision?: No Double vision?: No  Ears/Nose/Throat Sore throat?: No Sinus problems?: No  Hematologic/Lymphatic Swollen glands?: No Easy bruising?: No  Cardiovascular Leg swelling?: No Chest pain?: No  Respiratory Cough?: No Shortness of breath?: No  Endocrine Excessive thirst?: No  Musculoskeletal Back pain?: Yes Joint pain?: No  Neurological Headaches?: No Dizziness?: No  Psychologic Depression?: No Anxiety?: No  Physical Exam: BP (!) 144/75   Pulse 93   Resp 16   Ht '6\' 1"'  (1.854 m)   Wt 247 lb (112 kg)   BMI 32.59 kg/m   Constitutional: Well nourished. Alert and oriented, No acute distress. HEENT: [place] AT, moist mucus membranes. Trachea midline, no masses. Cardiovascular: No clubbing, cyanosis, or edema. Respiratory: Normal respiratory effort, no increased work of breathing. GI: Abdomen is soft, non tender, non distended, no abdominal masses. Liver and spleen not  palpable.  No hernias appreciated.  Stool sample for occult testing is not indicated.   GU: No CVA tenderness.  No bladder fullness or masses.  Patient with uncircumcised phallus.  Foreskin easily retracted   Urethral meatus is patent.  No penile discharge. No penile lesions or rashes. Scrotum without lesions, cysts, rashes and/or edema.  Testicles are located scrotally bilaterally. No masses are appreciated in the testicles. Left and right epididymis are normal. Rectal: Patient with  normal sphincter tone. Anus and perineum without scarring or rashes. No rectal masses are appreciated. Prostate is approximately 60 grams, could  only palpate the apex and midportion of gland, no nodules are appreciated.  Skin: No rashes, bruises or suspicious lesions. Lymph: No cervical or inguinal adenopathy. Neurologic: Grossly intact, no focal deficits, moving all 4 extremities. Psychiatric: Normal mood and affect.  Laboratory Data: Lab Results  Component Value Date   WBC 18.9 (H) 11/03/2015   HGB 12.9 (L) 11/03/2015   HCT 36.5 (L) 11/03/2015   MCV 87.2 11/03/2015   PLT 75 (L) 11/03/2015   Lab Results  Component Value Date   CREATININE 0.85 11/03/2015   Lab Results  Component Value Date   AST 25 11/03/2015   Lab Results  Component Value Date   ALT 14 (L) 11/03/2015   I have reviewed the labs.  Assessment & Plan:    1. BPH with LUTS IPSS score is 4/0, it is improving Continue conservative management, avoiding bladder irritants and timed voiding's RTC in one year for I PSS and exam  2. Erectile dysfunction PDE5i not effective  Not interested in injections at this time  Return in about 1 year (around 06/02/2019) for I PSS and exam .  These notes generated with voice recognition software. I apologize for typographical errors.  Zara Council, PA-C  [facility] [address] [place], [place] [address] [phone]

## 2018-06-05 ENCOUNTER — Inpatient Hospital Stay
Admission: EM | Admit: 2018-06-05 | Discharge: 2018-06-08 | DRG: 247 | Disposition: A | Discharge status: Home / Self Care | Payer: Medicare HMO | Attending: Internal Medicine | Admitting: Internal Medicine

## 2018-06-05 ENCOUNTER — Emergency Department: Payer: Medicare HMO

## 2018-06-05 ENCOUNTER — Other Ambulatory Visit: Payer: Self-pay

## 2018-06-05 ENCOUNTER — Encounter: Payer: Self-pay | Admitting: Emergency Medicine

## 2018-06-05 DIAGNOSIS — E1141 Type 2 diabetes mellitus with diabetic mononeuropathy: Secondary | ICD-10-CM | POA: Diagnosis present

## 2018-06-05 DIAGNOSIS — Z79890 Hormone replacement therapy (postmenopausal): Secondary | ICD-10-CM

## 2018-06-05 DIAGNOSIS — Z7901 Long term (current) use of anticoagulants: Secondary | ICD-10-CM

## 2018-06-05 DIAGNOSIS — I10 Essential (primary) hypertension: Secondary | ICD-10-CM | POA: Diagnosis present

## 2018-06-05 DIAGNOSIS — R079 Chest pain, unspecified: Secondary | ICD-10-CM | POA: Diagnosis present

## 2018-06-05 DIAGNOSIS — E785 Hyperlipidemia, unspecified: Secondary | ICD-10-CM | POA: Diagnosis present

## 2018-06-05 DIAGNOSIS — Z7984 Long term (current) use of oral hypoglycemic drugs: Secondary | ICD-10-CM

## 2018-06-05 DIAGNOSIS — K219 Gastro-esophageal reflux disease without esophagitis: Secondary | ICD-10-CM | POA: Diagnosis present

## 2018-06-05 DIAGNOSIS — Z955 Presence of coronary angioplasty implant and graft: Secondary | ICD-10-CM

## 2018-06-05 DIAGNOSIS — I4821 Permanent atrial fibrillation: Secondary | ICD-10-CM | POA: Diagnosis present

## 2018-06-05 DIAGNOSIS — N529 Male erectile dysfunction, unspecified: Secondary | ICD-10-CM | POA: Diagnosis present

## 2018-06-05 DIAGNOSIS — E669 Obesity, unspecified: Secondary | ICD-10-CM | POA: Diagnosis present

## 2018-06-05 DIAGNOSIS — I214 Non-ST elevation (NSTEMI) myocardial infarction: Secondary | ICD-10-CM | POA: Diagnosis not present

## 2018-06-05 DIAGNOSIS — Z8249 Family history of ischemic heart disease and other diseases of the circulatory system: Secondary | ICD-10-CM

## 2018-06-05 DIAGNOSIS — Z87891 Personal history of nicotine dependence: Secondary | ICD-10-CM

## 2018-06-05 DIAGNOSIS — M4800 Spinal stenosis, site unspecified: Secondary | ICD-10-CM | POA: Diagnosis present

## 2018-06-05 DIAGNOSIS — I252 Old myocardial infarction: Secondary | ICD-10-CM

## 2018-06-05 DIAGNOSIS — Z23 Encounter for immunization: Secondary | ICD-10-CM

## 2018-06-05 DIAGNOSIS — Z6832 Body mass index (BMI) 32.0-32.9, adult: Secondary | ICD-10-CM

## 2018-06-05 DIAGNOSIS — D696 Thrombocytopenia, unspecified: Secondary | ICD-10-CM | POA: Diagnosis present

## 2018-06-05 DIAGNOSIS — G5790 Unspecified mononeuropathy of unspecified lower limb: Secondary | ICD-10-CM | POA: Diagnosis present

## 2018-06-05 DIAGNOSIS — I2510 Atherosclerotic heart disease of native coronary artery without angina pectoris: Secondary | ICD-10-CM | POA: Diagnosis present

## 2018-06-05 LAB — TROPONIN I
TROPONIN I: 0.05 ng/mL — AB (ref <0.03)
TROPONIN I: 0.66 ng/mL — AB (ref <0.03)
TROPONIN I: 0.6 ng/mL — AB (ref <0.03)
Troponin I: 0.54 ng/mL (ref <0.03)

## 2018-06-05 LAB — BASIC METABOLIC PANEL
Anion gap: 10 (ref 5–15)
BUN: 10 mg/dL (ref 8–23)
CO2: 26 mmol/L (ref 22–32)
Calcium: 10.2 mg/dL (ref 8.9–10.3)
Chloride: 101 mmol/L (ref 98–111)
Creatinine, Ser: 0.84 mg/dL (ref 0.61–1.24)
GFR calc Af Amer: >60 mL/min (ref >60)
GFR calc non Af Amer: >60 mL/min (ref >60)
GLUCOSE: 268 mg/dL — AB (ref 70–99)
Potassium: 3.6 mmol/L (ref 3.5–5.1)
Sodium: 137 mmol/L (ref 135–145)

## 2018-06-05 LAB — CBC
HCT: 38.9 % — ABNORMAL LOW (ref 40.0–52.0)
Hemoglobin: 14.2 g/dL (ref 13.0–18.0)
MCH: 32.2 pg (ref 26.0–34.0)
MCHC: 36.5 g/dL — ABNORMAL HIGH (ref 32.0–36.0)
MCV: 88.1 fL (ref 80.0–100.0)
PLATELETS: 82 10*3/uL — AB (ref 150–440)
RBC: 4.42 MIL/uL (ref 4.40–5.90)
RDW: 16 % — ABNORMAL HIGH (ref 11.5–14.5)
WBC: 6.9 10*3/uL (ref 3.8–10.6)

## 2018-06-05 LAB — HEPATIC FUNCTION PANEL
ALT: 12 U/L (ref 0–44)
AST: 22 U/L (ref 15–41)
Albumin: 4.4 g/dL (ref 3.5–5.0)
Alkaline Phosphatase: 55 U/L (ref 38–126)
BILIRUBIN INDIRECT: 1.2 mg/dL — AB (ref 0.3–0.9)
Bilirubin, Direct: 0.3 mg/dL — ABNORMAL HIGH (ref 0.0–0.2)
TOTAL PROTEIN: 6.6 g/dL (ref 6.5–8.1)
Total Bilirubin: 1.5 mg/dL — ABNORMAL HIGH (ref 0.3–1.2)

## 2018-06-05 LAB — GLUCOSE, CAPILLARY
GLUCOSE-CAPILLARY: 148 mg/dL — AB (ref 70–99)
Glucose-Capillary: 105 mg/dL — ABNORMAL HIGH (ref 70–99)

## 2018-06-05 LAB — CREATININE, SERUM
Creatinine, Ser: 0.81 mg/dL (ref 0.61–1.24)
GFR calc Af Amer: >60 mL/min (ref >60)
GFR calc non Af Amer: >60 mL/min (ref >60)

## 2018-06-05 LAB — HEPARIN LEVEL (UNFRACTIONATED): HEPARIN UNFRACTIONATED: 2.24 [IU]/mL — AB (ref 0.30–0.70)

## 2018-06-05 LAB — PROTIME-INR
INR: 1.4
PROTHROMBIN TIME: 17 s — AB (ref 11.4–15.2)

## 2018-06-05 LAB — APTT: aPTT: 40 seconds — ABNORMAL HIGH (ref 24–36)

## 2018-06-05 LAB — BRAIN NATRIURETIC PEPTIDE: B NATRIURETIC PEPTIDE 5: 235 pg/mL — AB (ref 0.0–100.0)

## 2018-06-05 MED ORDER — VITAMIN D3 25 MCG (1000 UNIT) PO TABS
1000.0000 [IU] | ORAL_TABLET | Freq: Two times a day (BID) | ORAL | Status: DC
  Administered 2018-06-06 – 2018-06-08 (×4): 1000 [IU] via ORAL
  Filled 2018-06-05 (×7): qty 1

## 2018-06-05 MED ORDER — LISINOPRIL 10 MG PO TABS
10.0000 mg | ORAL_TABLET | Freq: Every day | ORAL | Status: DC
  Administered 2018-06-06 – 2018-06-08 (×3): 10 mg via ORAL
  Filled 2018-06-05 (×3): qty 1

## 2018-06-05 MED ORDER — GLIMEPIRIDE 2 MG PO TABS
4.0000 mg | ORAL_TABLET | Freq: Two times a day (BID) | ORAL | Status: DC
  Administered 2018-06-06: 4 mg via ORAL
  Filled 2018-06-05 (×2): qty 1
  Filled 2018-06-05: qty 2
  Filled 2018-06-05: qty 1

## 2018-06-05 MED ORDER — HYDROCHLOROTHIAZIDE 25 MG PO TABS
25.0000 mg | ORAL_TABLET | Freq: Every day | ORAL | Status: DC
  Administered 2018-06-06: 25 mg via ORAL
  Filled 2018-06-05: qty 1

## 2018-06-05 MED ORDER — ASPIRIN 81 MG PO CHEW
324.0000 mg | CHEWABLE_TABLET | Freq: Once | ORAL | Status: AC
  Administered 2018-06-05: 324 mg via ORAL
  Filled 2018-06-05: qty 4

## 2018-06-05 MED ORDER — ONDANSETRON HCL 4 MG/2ML IJ SOLN
4.0000 mg | Freq: Four times a day (QID) | INTRAMUSCULAR | Status: DC | PRN
  Administered 2018-06-05 – 2018-06-07 (×3): 4 mg via INTRAVENOUS
  Filled 2018-06-05 (×3): qty 2

## 2018-06-05 MED ORDER — OXYCODONE-ACETAMINOPHEN 5-325 MG PO TABS
1.0000 | ORAL_TABLET | ORAL | Status: DC | PRN
  Administered 2018-06-05 – 2018-06-08 (×12): 1 via ORAL
  Filled 2018-06-05 (×13): qty 1

## 2018-06-05 MED ORDER — OXYCODONE-ACETAMINOPHEN 10-325 MG PO TABS: 1.0000 | ORAL_TABLET | ORAL | Status: DC | PRN

## 2018-06-05 MED ORDER — HEPARIN BOLUS VIA INFUSION
4000.0000 [IU] | Freq: Once | INTRAVENOUS | Status: AC
  Administered 2018-06-05: 4000 [IU] via INTRAVENOUS
  Filled 2018-06-05: qty 4000

## 2018-06-05 MED ORDER — OXYCODONE HCL 5 MG PO TABS
5.0000 mg | ORAL_TABLET | ORAL | Status: DC | PRN
  Administered 2018-06-05 – 2018-06-07 (×11): 5 mg via ORAL
  Administered 2018-06-07: 15:00:00 via ORAL
  Administered 2018-06-08 (×2): 5 mg via ORAL
  Filled 2018-06-05 (×14): qty 1

## 2018-06-05 MED ORDER — MAGNESIUM OXIDE 400 (241.3 MG) MG PO TABS
400.0000 mg | ORAL_TABLET | Freq: Every day | ORAL | Status: DC
  Administered 2018-06-06 – 2018-06-08 (×3): 400 mg via ORAL
  Filled 2018-06-05 (×3): qty 1

## 2018-06-05 MED ORDER — APIXABAN 5 MG PO TABS: 5.0000 mg | ORAL_TABLET | Freq: Two times a day (BID) | ORAL | Status: DC

## 2018-06-05 MED ORDER — DIAZEPAM 5 MG PO TABS
5.0000 mg | ORAL_TABLET | Freq: Three times a day (TID) | ORAL | Status: DC | PRN
  Administered 2018-06-06: 5 mg via ORAL
  Filled 2018-06-05: qty 1

## 2018-06-05 MED ORDER — INSULIN ASPART 100 UNIT/ML ~~LOC~~ SOLN
0.0000 [IU] | Freq: Three times a day (TID) | SUBCUTANEOUS | Status: DC
  Administered 2018-06-06: 2 [IU] via SUBCUTANEOUS
  Filled 2018-06-05: qty 1

## 2018-06-05 MED ORDER — HEPARIN (PORCINE) IN NACL 100-0.45 UNIT/ML-% IJ SOLN
1250.0000 [IU]/h | INTRAMUSCULAR | Status: DC
  Administered 2018-06-05 – 2018-06-06 (×2): 1250 [IU]/h via INTRAVENOUS
  Filled 2018-06-05 (×2): qty 250

## 2018-06-05 MED ORDER — ACETAMINOPHEN 325 MG PO TABS: 650.0000 mg | ORAL_TABLET | ORAL | Status: DC | PRN

## 2018-06-05 MED ORDER — OMEGA-3-ACID ETHYL ESTERS 1 G PO CAPS
2.0000 g | ORAL_CAPSULE | Freq: Every day | ORAL | Status: DC
  Administered 2018-06-06 – 2018-06-08 (×3): 2 g via ORAL
  Filled 2018-06-05 (×3): qty 2

## 2018-06-05 MED ORDER — MORPHINE SULFATE (PF) 2 MG/ML IV SOLN
1.0000 mg | INTRAVENOUS | Status: DC | PRN
  Administered 2018-06-05 (×2): 1 mg via INTRAVENOUS
  Filled 2018-06-05 (×2): qty 1

## 2018-06-05 MED ORDER — CO Q-10 200 MG PO CAPS: 200.0000 mg | ORAL_CAPSULE | Freq: Every day | ORAL | Status: DC

## 2018-06-05 MED ORDER — INFLUENZA VAC SPLIT HIGH-DOSE 0.5 ML IM SUSY
0.5000 mL | PREFILLED_SYRINGE | INTRAMUSCULAR | Status: AC
  Administered 2018-06-08: 0.5 mL via INTRAMUSCULAR
  Filled 2018-06-05 (×2): qty 0.5

## 2018-06-05 MED ORDER — SIMVASTATIN 20 MG PO TABS
40.0000 mg | ORAL_TABLET | Freq: Every day | ORAL | Status: DC
  Administered 2018-06-05: 40 mg via ORAL
  Filled 2018-06-05: qty 2

## 2018-06-05 MED ORDER — PANTOPRAZOLE SODIUM 40 MG PO TBEC
40.0000 mg | DELAYED_RELEASE_TABLET | Freq: Every day | ORAL | Status: DC
  Administered 2018-06-06 – 2018-06-08 (×3): 40 mg via ORAL
  Filled 2018-06-05 (×3): qty 1

## 2018-06-05 MED ORDER — DILTIAZEM HCL ER COATED BEADS 180 MG PO CP24
180.0000 mg | ORAL_CAPSULE | Freq: Every day | ORAL | Status: DC
  Administered 2018-06-06 – 2018-06-08 (×3): 180 mg via ORAL
  Filled 2018-06-05 (×3): qty 1

## 2018-06-05 MED ORDER — FENTANYL CITRATE (PF) 100 MCG/2ML IJ SOLN
50.0000 ug | Freq: Once | INTRAMUSCULAR | Status: AC
  Administered 2018-06-05: 50 ug via INTRAVENOUS
  Filled 2018-06-05: qty 2

## 2018-06-05 NOTE — Consult Note (Signed)
ANTICOAGULATION CONSULT NOTE - Initial Consult  Pharmacy Consult for heparin dosing. Indication: chest pain/ACS  Allergies  Allergen Reactions  . Amitriptyline   . Lyrica [Pregabalin]   . Metformin Nausea Only  . Neurontin [Gabapentin]   . Nitroglycerin Other (See Comments)    Makes Blood pressure go to low.   . Novocain [Procaine]   . Procaine Hcl Other (See Comments)    Passes out    Patient Measurements: Height: 6\' 1"  (185.4 cm) Weight: 247 lb (112 kg) IBW/kg (Calculated) : 79.9 Heparin Dosing Weight: 103.5  Vital Signs: Temp: 97.7 F (36.5 C) (09/30 0926) Temp Source: Oral (09/30 0926) BP: 109/52 (09/30 1800) Pulse Rate: 65 (09/30 1715)  Labs: Recent Labs    06/05/18 0928 06/05/18 1546  HGB 14.2  --   HCT 38.9*  --   PLT 82*  --   LABPROT 17.0*  --   INR 1.40  --   CREATININE 0.84 0.81  TROPONINI 0.05* 0.66*    Estimated Creatinine Clearance: 101.7 mL/min (by C-G formula based on SCr of 0.81 mg/dL).   Medical History: Past Medical History:  Diagnosis Date  . Arthritis    degenerative back   . BPH with obstruction/lower urinary tract symptoms   . CAD (coronary artery disease) 2010   stent  . Diabetes mellitus   . ED (erectile dysfunction)   . Elevated PSA   . GERD (gastroesophageal reflux disease)   . Gross hematuria   . H/O CT scan    recent renal CT due to hematuria, cystoscopy - normal    . Hematuria   . History of hiatal hernia   . History of kidney stones   . HLD (hyperlipidemia)   . Hypertension   . Ischemic heart disease 1991   with angioplasty  . Kidney stone   . Morbid obesity ([place])   . Myocardial infarction ([place])   . Peripheral neuropathy   . Pernicious anemia   . Pneumonia 1995   /w PE,   . Pulmonary embolism ([place])    previous  . Renal cyst   . Sleep apnea    pt. denies sleep apnea, pt. states he has had 2 studies - last one 2011  . Spinal stenosis     Medications:  Medications Prior to Admission  Medication Sig  Dispense Refill Last Dose  . acetaminophen (TYLENOL) 500 MG tablet Take 500 mg by mouth daily.   06/05/2018 at am  . apixaban (ELIQUIS) 5 MG TABS tablet Take 1 tablet (5 mg total) by mouth 2 (two) times daily. 60 tablet 6 06/05/2018 at 0700  . cholecalciferol (VITAMIN D) 1000 units tablet Take 1,000 Units by mouth 2 (two) times daily.   06/05/2018 at am  . Coenzyme Q10 (CO Q-10) 200 MG CAPS Take 200 mg by mouth daily.   06/05/2018 at am  . Cyanocobalamin (VITAMIN B-12 IJ) Inject 1 vial into the muscle every 30 (thirty) days.    Past Week at Unknown time  . diazepam (VALIUM) 5 MG tablet Take 5 mg by mouth 3 (three) times daily as needed for anxiety or muscle spasms.    unknown at unknown  . diltiazem (CARDIZEM CD) 180 MG 24 hr capsule Take 180 mg by mouth daily before breakfast.    06/05/2018 at am  . glimepiride (AMARYL) 4 MG tablet Take 4 mg by mouth 2 (two) times daily.    06/05/2018 at am  . hydrochlorothiazide 25 MG tablet Take 25 mg by mouth daily.  06/05/2018 at am  . lisinopril (PRINIVIL,ZESTRIL) 10 MG tablet TAKE ONE TABLET BY MOUTH ONCE DAILY (Patient taking differently: Take 10 mg by mouth daily. ) 90 tablet 2 06/05/2018 at am  . magnesium oxide (MAG-OX) 400 MG tablet Take 400 mg by mouth daily.   06/05/2018 at am  . omega-3 acid ethyl esters (LOVAZA) 1 g capsule Take 2 g by mouth daily.   06/05/2018 at am  . omeprazole (PRILOSEC) 20 MG capsule Take 20 mg by mouth 2 (two) times daily.    06/05/2018 at am  . oxyCODONE-acetaminophen (PERCOCET) 10-325 MG tablet Take 1 tablet by mouth every 4 (four) hours as needed for pain.    unknown at unknown  . sildenafil (VIAGRA) 100 MG tablet Take 100 mg by mouth daily as needed for erectile dysfunction.    unknown at unknown  . simvastatin (ZOCOR) 40 MG tablet Take 40 mg by mouth at bedtime.    06/04/2018 at pm  . sucralfate (CARAFATE) 1 g tablet 1 g 3 (three) times daily. Patient dissolves this in 1 tablespoon of warm water each time.  0 06/05/2018 at am  .  testosterone cypionate (DEPOTESTOSTERONE CYPIONATE) 200 MG/ML injection Inject 200 mg into the muscle every 28 (twenty-eight) days.    Past Week at Unknown time    Assessment: Patient previously on apixaban 5 mg twice a day for a.fib with last dose recorded at 0700 today 06/05/18.  Goal of Therapy:  Heparin level 0.3-0.7 units/ml aPTT 66-102 seconds Monitor platelets by anticoagulation protocol: Yes   Plan:  Will bolus with 4000 units then will start heparin infusion at 1250 units/hr. Check anti-Xa level every 8 hours until both aPPT and heparin level correlate to therapeutic range.  Heparin level will be drawn daily. Continue to monitor H&H and platelets  Forrest Moron 06/05/2018,6:34 PM

## 2018-06-05 NOTE — ED Provider Notes (Signed)
[facility] Emergency Department Provider Note  ____________________________________________   I have reviewed the triage vital signs and the nursing notes. Where available I have reviewed prior notes and, if possible and indicated, outside hospital notes.    HISTORY  Chief Complaint Abdominal Pain and Chest Pain    HPI Cody Price is a 77 y.o. male a history of arthritis, CAD status post stent MI in the past, erectile dysfunction, reflux disease hematuria multiple allergies, hypertension diabetes etc. who presents today complaining of a chest tightness which is been there every day all day for 3 weeks at least.  It is worse when he exerts himself better when he relaxes.  Is not positional there is no difference light leaning forward or lying back.  Is a tightness that does not radiate.  He is also been having some stomach pains.  He did have a CT scan on the 25th of this month which showed a very small pericardial effusion but no intra-abdominal pathology.  It is not food related.  He denies any change in stooling.  He denies any vomiting.  He does have a history of atrial fibrillation and he is on Eliquis which he takes as prescribed.  No melena no bright red blood per rectum.    Past Medical History:  Diagnosis Date  . Arthritis    degenerative back   . BPH with obstruction/lower urinary tract symptoms   . CAD (coronary artery disease) 2010   stent  . Diabetes mellitus   . ED (erectile dysfunction)   . Elevated PSA   . GERD (gastroesophageal reflux disease)   . Gross hematuria   . H/O CT scan    recent renal CT due to hematuria, cystoscopy - normal    . Hematuria   . History of hiatal hernia   . History of kidney stones   . HLD (hyperlipidemia)   . Hypertension   . Ischemic heart disease 1991   with angioplasty  . Kidney stone   . Morbid obesity ([place])   . Myocardial infarction ([place])   . Peripheral neuropathy   . Pernicious anemia   .  Pneumonia 1995   /w PE,   . Pulmonary embolism ([place])    previous  . Renal cyst   . Sleep apnea    pt. denies sleep apnea, pt. states he has had 2 studies - last one 2011  . Spinal stenosis     Patient Active Problem List   Diagnosis Date Noted  . Weakness 02/13/2018  . Chronic postoperative pain 04/13/2017  . Lumbar post-laminectomy syndrome 04/13/2017  . Antiplatelet or antithrombotic long-term use 04/11/2017  . Neuropathy 09/21/2016  . Chronic, continuous use of opioids 05/27/2016  . Erectile dysfunction 01/01/2016  . BPH with obstruction/lower urinary tract symptoms 01/01/2016  . Pneumonia 11/03/2015  . Nocturia 10/02/2015  . Erectile dysfunction of organic origin 10/02/2015  . Angina pectoris ([place]) 09/23/2015  . Thrombocytopenia ([place]) 09/23/2015  . B12 deficiency 05/26/2015  . Chronic idiopathic thrombocytopenia ([place]) 02/10/2015  . Anemia 05/08/2014  . Diabetes mellitus type 2, uncomplicated ([place]) 29/52/8413  . DDD (degenerative disc disease) 05/08/2014  . Elevated PSA 05/08/2014  . H/O ulcer disease 05/08/2014  . History of kidney stones 05/08/2014  . Hx of pulmonary embolus 05/08/2014  . Hypertension 05/08/2014  . Kidney stones 05/08/2014  . Obesity 05/08/2014  . Spinal stenosis 05/08/2014  . Chronic back pain 06/21/2013  . Diabetes mellitus ([place]) 06/20/2012  . Hyperlipidemia 05/28/2010  . Coronary  atherosclerosis 05/28/2010  . ATRIAL FIBRILLATION 05/28/2010    Past Surgical History:  Procedure Laterality Date  . APPENDECTOMY    . BACK SURGERY     x3 back surgeries - /w fusioin, 1- [place]  . bilateral inguinal hernia repair    . cad     stent 2010  . CARDIAC CATHETERIZATION  08/17/2014  . CARPAL TUNNEL RELEASE  2014   bilateral   . CHOLECYSTECTOMY    . LAMINECTOMY      Prior to Admission medications   Medication Sig Start Date End Date Taking? Authorizing Provider  apixaban (ELIQUIS) 5 MG TABS tablet Take 1 tablet (5 mg total) by mouth  2 (two) times daily. 02/13/18   Minna Merritts, MD  Blood Glucose Monitoring Suppl (ONE TOUCH ULTRA 2) w/Device KIT Use as instructed. 05/28/16   [provider]  Cholecalciferol (VITAMIN D) 2000 UNITS CAPS Take by mouth daily.      [provider]  Coenzyme Q10 (CO Q-10 PO) Take 1 capsule by mouth daily.     [provider]  Cyanocobalamin (VITAMIN B-12 IJ) Inject 1 vial into the muscle every 30 (thirty) days.     [provider]  diazepam (VALIUM) 5 MG tablet Take 5 mg by mouth every 12 (twelve) hours as needed for muscle spasms.     [provider]  diltiazem (CARDIZEM CD) 180 MG 24 hr capsule Take 180 mg by mouth daily before breakfast.     [provider]  glimepiride (AMARYL) 4 MG tablet Take 4 mg by mouth daily. Sometimes pt. Takes this med. Twice daily    [provider]  glucose blood (ONE TOUCH ULTRA TEST) test strip  12/22/15   [provider]  hydrochlorothiazide 25 MG tablet Take 25 mg by mouth daily.      [provider]  lisinopril (PRINIVIL,ZESTRIL) 10 MG tablet TAKE ONE TABLET BY MOUTH ONCE DAILY 07/18/15   Minna Merritts, MD  Omega-3 Fatty Acids (TH FISH OIL EXTRA STRENGTH) 1200 MG CAPS Take 1 tablet by mouth daily.     [provider]  omeprazole (PRILOSEC) 20 MG capsule Take 20 mg by mouth 2 (two) times daily.     [provider]  oxyCODONE-acetaminophen (PERCOCET) 10-325 MG tablet Take 1 tablet by mouth every 4 (four) hours as needed.     [provider]  sildenafil (VIAGRA) 100 MG tablet Take 100 mg by mouth daily as needed for erectile dysfunction.     [provider]  simvastatin (ZOCOR) 40 MG tablet Take 40 mg by mouth daily before breakfast.     [provider]  sucralfate (CARAFATE) 1 g tablet DISSOLVE 1 TABLET IN 1 TABLESPOON OF WATER AND TAKE BY MOUTH 3 TIMES A DAY. HOLD VITAMIN D. 05/23/18   [provider]  testosterone cypionate  (DEPOTESTOSTERONE CYPIONATE) 200 MG/ML injection every 30 (thirty) days. 02/11/15   [provider]    Allergies Amitriptyline; Lyrica [pregabalin]; Metformin; Neurontin [gabapentin]; Nitroglycerin; Novocain [procaine]; and Procaine hcl  Family History  Problem Relation Age of Onset  . Heart failure Mother   . Heart disease Father   . Heart attack Brother   . Skin cancer Sister   . Kidney disease Neg Hx   . Prostate cancer Neg Hx   . Kidney cancer Neg Hx   . Bladder Cancer Neg Hx     Social History Social History   Tobacco Use  . Smoking status: Former  Smoker    Packs/day: 3.00    Years: 15.00    Pack years: 45.00    Last attempt to quit: 10/18/1985    Years since quitting: 32.6  . Smokeless tobacco: Former Systems developer  . Tobacco comment: tobacco use- no   Substance Use Topics  . Alcohol use: Yes    Comment: rare  . Drug use: No    Review of Systems Constitutional: No fever/chills Eyes: No visual changes. ENT: No sore throat. No stiff neck no neck pain Cardiovascular: Denies chest pain. Respiratory: Denies shortness of breath. Gastrointestinal:   no vomiting.  No diarrhea.  No constipation. Genitourinary: Negative for dysuria. Musculoskeletal: Negative lower extremity swelling Skin: Negative for rash. Neurological: Negative for severe headaches, focal weakness or numbness.   ____________________________________________   PHYSICAL EXAM:  VITAL SIGNS: ED Triage Vitals  Enc Vitals Group     BP 06/05/18 0926 126/90     Pulse Rate 06/05/18 0926 72     Resp 06/05/18 0926 20     Temp 06/05/18 0926 97.7 F (36.5 C)     Temp Source 06/05/18 0926 Oral     SpO2 06/05/18 0926 99 %     Weight 06/05/18 0927 247 lb (112 kg)     Height 06/05/18 0927 '6\' 1"'  (1.854 m)     Head Circumference --      Peak Flow --      Pain Score 06/05/18 0933 8     Pain Loc --      Pain Edu? --      Excl. in [place]? --     Constitutional: Alert and oriented. Well appearing and in no  acute distress. Eyes: Conjunctivae are normal Head: Atraumatic HEENT: No congestion/rhinnorhea. Mucous membranes are moist.  Oropharynx non-erythematous Neck:   Nontender with no meningismus, no masses, no stridor Cardiovascular: Normal rate, regular rhythm. Grossly normal heart sounds.  Good peripheral circulation. Respiratory: Normal respiratory effort.  No retractions. Lungs CTAB. Abdominal: Soft and nontender. No distention. No guarding no rebound Back:  There is no focal tenderness or step off.  there is no midline tenderness there are no lesions noted. there is no CVA tenderness Musculoskeletal: No lower extremity tenderness, no upper extremity tenderness. No joint effusions, no DVT signs strong distal pulses no edema Neurologic:  Normal speech and language. No gross focal neurologic deficits are appreciated.  Skin:  Skin is warm, dry and intact. No rash noted. Psychiatric: Mood and affect are normal. Speech and behavior are normal.  ____________________________________________   LABS (all labs ordered are listed, but only abnormal results are displayed)  Labs Reviewed  BASIC METABOLIC PANEL - Abnormal; Notable for the following components:      Result Value   Glucose, Bld 268 (*)    All other components within normal limits  CBC - Abnormal; Notable for the following components:   HCT 38.9 (*)    MCHC 36.5 (*)    RDW 16.0 (*)    Platelets 82 (*)    All other components within normal limits  TROPONIN I - Abnormal; Notable for the following components:   Troponin I 0.05 (*)    All other components within normal limits  PROTIME-INR - Abnormal; Notable for the following components:   Prothrombin Time 17.0 (*)    All other components within normal limits  HEPATIC FUNCTION PANEL  BRAIN NATRIURETIC PEPTIDE    Pertinent labs  results that were available during my care of the patient were reviewed by me and  considered in my medical decision making (see chart for  details). ____________________________________________  EKG  I personally interpreted any EKGs ordered by me or triage Atrial fibrillation rate 90 bpm no acute ST elevation or depression, borderline LAD no acute ischemia PVC noted  ____________________________________________  RADIOLOGY  Pertinent labs & imaging results that were available during my care of the patient were reviewed by me and considered in my medical decision making (see chart for details). If possible, patient and/or family made aware of any abnormal findings.  Dg Chest 2 View  Result Date: 06/05/2018 CLINICAL DATA:  Onset of squeezing sub sternal chest and abdominal pain beginning a couple of weeks ago associated with shortness of breath. History of coronary artery disease and previous MI. Former smoker. EXAM: CHEST - 2 VIEW COMPARISON:  PA and lateral chest x-ray of November 03, 2015. FINDINGS: The lungs are well-expanded. The interstitial markings are coarse. The heart and pulmonary vascularity are normal. The mediastinum is normal in width. There is calcification in the wall of the aortic arch. There is no pleural effusion. There is multilevel degenerative disc disease of the thoracic spine. IMPRESSION: Chronic bronchitic-smoking related changes. No pneumonia, CHF, nor other acute cardiopulmonary abnormality. Thoracic aortic atherosclerosis. Electronically Signed   By: David  Martinique M.D.   On: 06/05/2018 10:16   ____________________________________________    PROCEDURES  Procedure(s) performed: None  Procedures  Critical Care performed: None  ____________________________________________   INITIAL IMPRESSION / ASSESSMENT AND PLAN / ED COURSE  Pertinent labs & imaging results that were available during my care of the patient were reviewed by me and considered in my medical decision making (see chart for details).  Patient with somewhat atypical chest pain, he has been having chest pressure now for the last 3  weeks uninterruptedly which lessens the suspicion for ACS.  He also complains of abdominal pain but his abdomen is benign.  This is not the kind of history when usually goes from PE or dissection, he has a small pericardial effusion noted on CT scan pericarditis or pericardial effusion could cause some of this although patient does not have any real positional changes.  He no other does he have alterans on his EKG. EKG does not show to my read any acute ischemic changes which is not unusual considering the length of time that he has been having the symptoms however, his troponin is borderline.  I talked to Dr. Fletcher Anon, I very much appreciate consult, he does not feel that any further intervention is needed in the emergency room.  I have given him aspirin, patient was initially reluctant to take it because of a history of GI problems but we did discuss the risk benefits and alternatives of aspirin therapy and he agrees to it.  In addition, patient is already anticoagulated and cardiology does not feel he needs heparin which I agree with.  Low suspicion for PE, patient does not have any pleuritic pain, nor does he have any shortness of breath.  And again patient has had noninterrupted pain for 3 weeks making acute infarct less likely however certainly not outside the possibility.  Giving him fentanyl for his discomfort.  His abdomen on serial exams is completely benign no evidence at this time of ischemic gut, and no complaints of ongoing abdominal pain here.    ____________________________________________   FINAL CLINICAL IMPRESSION(S) / ED DIAGNOSES  Final diagnoses:  Chest pain, unspecified type      This chart was dictated using voice recognition software.  Despite  best efforts to proofread,  errors can occur which can change meaning.   3   Schuyler Amor, MD 06/05/18 [phone]

## 2018-06-05 NOTE — ED Notes (Signed)
Urine collected just in case needed and sent to lab   Pt snacking on graham crackers and sprite. Wife at bedside having coffee.  Lm edt

## 2018-06-05 NOTE — ED Triage Notes (Signed)
Pt presents to ED via POV with c/o CP and Abd pain that started "a couple of weeks ago". Pt also endorses some SOB. Pt states CP is substernal with no radiation, states "it's like someone is squeezing me to death".

## 2018-06-05 NOTE — Plan of Care (Signed)

## 2018-06-05 NOTE — Consult Note (Signed)
Cardiology Consultation:   Patient ID: Cody Price MRN: [id number]; DOB: March 29, 1941  Admit date: 06/05/2018 Date of Consult: 06/05/2018  Primary Care Provider: Baxter Hire, MD Primary Cardiologist: No primary care provider on file. Cody Price Primary Electrophysiologist:  None    Patient Profile:   Cody Price is a 77 y.o. male known to [facility] with a hx of CAD s/p PCI (1991 stent to RCA later occluded 100%, 2010 DES to LAD ,2015 cath with recommendation for plavix), paroxysmal Afib, HTN, HLD, DM2 with lower extremity neuropathy, spinal stenosis s/p surgery,GERD, and prior smoker who is being seen today for the evaluation of chest pain at the request of Cody Price.  History of Present Illness:   Cody Price is a 77 yo male with PMH as above known to [facility] and with known CAD s/p PCI and paroxysmal Afib on Eliquis. Patient is a former smoker.   Last seen by [facility] as an outpatient with Cody Price for paroxysmal atrial fibrillation in 03/2018 and in atrial fib at the time of this visit. Per documentation, patient with c/o atypical chest pain but noted as stable since DES was placed to his LAD in 2010. Per 02/2018 office visit, patient c/o atypical chest pain and general malaise, described as burning that occurred at rest without radiation and was associated with fatigue. He subsequently underwent a 02/2018 stress test that showed normal wall motion, EF 49%, and ruled a low risk scan though unable to exclude a small region of fixed inferior wall perfusion defect. Also of note, per previous documentation, the patient has declined anticoagulation in the past and is documented as not able to tolerate ASA well d/t self reported and remote (20 years prior) h/o GI problems and thrombocytopenia. Per Cody Price, patient also previously stopped metoprolol d/t c/o fatigue.   05/31/18 CT scan of abdomen showed interval small pericardial effusion without signs of cardiac tamponade, stable  splenomegaly, mild colonic and distal ileal diverticulosis, and mild to diffuse pancreatic atrophy. Also noted were mild coronary artery atherosclerosis.   Today 06/05/2018, patient presented to [facility] ED with chest tightness and pain x3 weeks. The chest pain was described as Price sided, non-pleuritic, non-positional, non-radiating and unrelated to eating. At its worse, the pain was described as stabbing and rated 15/10. Per patient report, he became "weak in his knees" and light headed due to extreme chest pain when the pain was at its worst but did not pass out. Per patient report, the chest pain is currently and has been associated with both stomach and back pains. Patient denied any associated hematemesis or hematochezia. He denied any recent episodes of emesis but does endorse current abdominal pain and nausea associated with his chest pain. He reportedly decided to come to the ED because of new SOB associated with the chest pain this morning. PTA medications include Eliquis, and patient reports medication compliance. 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   In the ED Vitals: BP 126/90, HR 72, RR 20, T 97.7, SpO2 99% Labs: glucose 268, plts 82, K 3.6, Cr 0.84, AST 22, ALT 12, WBC 6.9. Hgb 14.2 Troponin 0.05 (and cycling); BNP 235 EKG: Atrial fibrillation with ventricular rate 90bpm  CXR: Chronic bronchitic-smoking related changes. No pneumonia, CHF, nor other acute cardiopulmonary abnormality. Thoracic aortic atherosclerosis.   During physical exam, patient continues to report Price sided, non-pleuritic, non positional, non-radiating chest pain, described as dull  and achy. No pericardial friction rub heard on exam and patient did not report alleviation of chest pain with leaning forward versus laying back in the bed. He does note resolution of his SOB now that he has been resting in bed. Intermittent PVCs were noted on  telemetry during exam; however, patient denied feeling the PVCs and reports he is asymptomatic with his afib. Patient has received ASA in the emergency room despite his reluctance to initially take the medication. He has also received fentanyl for his discomfort.  Not yet on heparin.  Update: Troponin 0.05  0.66   Past Medical History:  Diagnosis Date  . Arthritis    degenerative back   . BPH with obstruction/lower urinary tract symptoms   . CAD (coronary artery disease) 2010   stent  . Diabetes mellitus   . ED (erectile dysfunction)   . Elevated PSA   . GERD (gastroesophageal reflux disease)   . Gross hematuria   . H/O CT scan    recent renal CT due to hematuria, cystoscopy - normal    . Hematuria   . History of hiatal hernia   . History of kidney stones   . HLD (hyperlipidemia)   . Hypertension   . Ischemic heart disease 1991   with angioplasty  . Kidney stone   . Morbid obesity ([name])   . Myocardial infarction ([name])   . Peripheral neuropathy   . Pernicious anemia   . Pneumonia 1995   /w PE,   . Pulmonary embolism ([place])    previous  . Renal cyst   . Sleep apnea    pt. denies sleep apnea, pt. states he has had 2 studies - last one 2011  . Spinal stenosis     Past Surgical History:  Procedure Laterality Date  . APPENDECTOMY    . BACK SURGERY     x3 back surgeries - /w fusioin, 1- [place]  . bilateral inguinal hernia repair    . cad     stent 2010  . CARDIAC CATHETERIZATION  08/17/2014  . CARPAL TUNNEL RELEASE  2014   bilateral   . CHOLECYSTECTOMY    . LAMINECTOMY       Home Medications:  Prior to Admission medications   Medication Sig Start Date End Date Taking? Authorizing Provider  acetaminophen (TYLENOL) 500 MG tablet Take 500 mg by mouth daily.   Yes [provider]  apixaban (ELIQUIS) 5 MG TABS tablet Take 1 tablet (5 mg total) by mouth 2 (two) times daily. 02/13/18  Yes Minna Merritts, MD  cholecalciferol (VITAMIN D) 1000 units  tablet Take 1,000 Units by mouth 2 (two) times daily.   Yes [provider]  Coenzyme Q10 (CO Q-10) 200 MG CAPS Take 200 mg by mouth daily.   Yes [provider]  Cyanocobalamin (VITAMIN B-12 IJ) Inject 1 vial into the muscle every 30 (thirty) days.    Yes [provider]  diazepam (VALIUM) 5 MG tablet Take 5 mg by mouth 3 (three) times daily as needed for anxiety or muscle spasms.    Yes [provider]  diltiazem (CARDIZEM CD) 180 MG 24 hr capsule Take 180 mg by mouth daily before breakfast.    Yes [provider]  glimepiride (AMARYL) 4 MG tablet Take 4 mg by mouth 2 (two) times daily.    Yes [provider]  hydrochlorothiazide 25 MG tablet Take 25 mg by mouth daily.     Yes [provider]  lisinopril (PRINIVIL,ZESTRIL) 10 MG tablet TAKE ONE TABLET BY MOUTH ONCE DAILY Patient taking differently: Take 10 mg by mouth daily.  07/18/15  Yes Gollan, Kathlene November, MD  magnesium oxide (MAG-OX) 400 MG tablet Take 400 mg by mouth daily.   Yes [provider]  omega-3 acid ethyl esters (LOVAZA) 1 g capsule Take 2 g by mouth daily.   Yes [provider]  omeprazole (PRILOSEC) 20 MG capsule Take 20 mg by mouth 2 (two) times daily.    Yes [provider]  oxyCODONE-acetaminophen (PERCOCET) 10-325 MG tablet Take 1 tablet by mouth every 4 (four) hours as needed for pain.    Yes [provider]  sildenafil (VIAGRA) 100 MG tablet Take 100 mg by mouth daily as needed for erectile dysfunction.    Yes [provider]  simvastatin (ZOCOR) 40 MG tablet Take 40 mg by mouth at bedtime.    Yes [provider]  sucralfate (CARAFATE) 1 g tablet 1 g 3 (three) times daily. Patient dissolves this in 1 tablespoon of warm water each time.   Yes [provider]  testosterone cypionate (DEPOTESTOSTERONE CYPIONATE) 200 MG/ML injection Inject 200 mg into the muscle every 28 (twenty-eight) days.    Yes  [provider]    Inpatient Medications: Scheduled Meds: . insulin aspart  0-9 Units Subcutaneous TID WC   Continuous Infusions:  PRN Meds: morphine injection, ondansetron (ZOFRAN) IV  Allergies:    Allergies  Allergen Reactions  . Amitriptyline   . Lyrica [Pregabalin]   . Metformin Nausea Only  . Neurontin [Gabapentin]   . Nitroglycerin Other (See Comments)    Makes Blood pressure go to low.   . Novocain [Procaine]   . Procaine Hcl Other (See Comments)    Passes out    Social History:   Social History   Socioeconomic History  . Marital status: Married    Spouse name: Not on file  . Number of children: Not on file  . Years of education: Not on file  . Highest education level: Not on file  Occupational History  . Not on file  Social Needs  . Financial resource strain: Not on file  . Food insecurity:    Worry: Not on file    Inability: Not on file  . Transportation needs:    Medical: Not on file    Non-medical: Not on file  Tobacco Use  . Smoking status: Former Smoker    Packs/day: 3.00    Years: 15.00    Pack years: 45.00    Last attempt to quit: 10/18/1985    Years since quitting: 32.6  . Smokeless tobacco: Former Systems developer  . Tobacco comment: tobacco use- no   Substance and Sexual Activity  . Alcohol use: Yes    Comment: rare  . Drug use: No  . Sexual activity: Not on file  Lifestyle  . Physical activity:    Days per week: Not on file    Minutes per session: Not on file  . Stress: Not on file  Relationships  . Social connections:    Talks on phone: Not on file    Gets together: Not on file    Attends religious service: Not on file    Active member of club or organization: Not on file    Attends meetings of clubs or organizations: Not on file    Relationship status: Not on file  . Intimate partner violence:    Fear of current or ex partner: Not  on file    Emotionally abused: Not on file    Physically abused: Not on file    Forced sexual  activity: Not on file  Other Topics Concern  . Not on file  Social History Narrative   Walks regularly. Retired.     Family History:    Family History  Problem Relation Age of Onset  . Heart failure Mother   . Heart disease Father   . Heart attack Brother   . Skin cancer Sister   . Kidney disease Neg Hx   . Prostate cancer Neg Hx   . Kidney cancer Neg Hx   . Bladder Cancer Neg Hx      ROS:  Please see the history of present illness.   All other ROS reviewed and negative.     Physical Exam/Data:   Vitals:   06-Jun-2018 1045 2018-06-06 1100 06-06-2018 1130 06-06-18 1230  BP:  (!) 116/48 (!) 108/55 (!) 101/57  Pulse: 69 69 81 (!) 113  Resp: 18 15 15 16   Temp:      TempSrc:      SpO2: 98% 99% 97% 98%  Weight:      Height:       No intake or output data in the 24 hours ending June 06, 2018 1522 Filed Weights   Jun 06, 2018 0927  Weight: 112 kg   Body mass index is 32.59 kg/m.  General:  Well nourished, well developed, in no acute distress. Accompanied by wife and cousin HEENT: normal Lymph: no adenopathy Neck: no JVD Endocrine:  No thryomegaly Vascular: No carotid bruits; FA pulses 2+ bilaterally without bruits  Cardiac:  IRIR, ventricular rate  60s on telemetry, soft heart sounds/ S1, S2; no murmur  Lungs:  clear to auscultation bilaterally, no wheezing, rhonchi or rales  Abd: firm, nontender, no hepatomegaly  Ext: no edema Musculoskeletal:  No deformities,  Skin: warm and dry  Neuro:   no focal abnormalities noted Psych:  Normal affect   EKG: Refer to HPI Telemetry:  Telemetry was personally reviewed and demonstrates:  IRIR with ventricular rate in 60s-70s  CV Studies:   Relevant CV Studies:  06-Jun-2018 TTE Pending results  02/14/2018 NM Pharmacological myocardial perfusion imaging study with no significant  Ischemia Significant GI uptake artifact making interpretation of inferior wall perfusion difficult. Unable to exclude small region of fixed inferior wall  perfusion defect Normal wall motion, EF estimated at 49% No EKG changes concerning for ischemia at peak stress or in recovery. Rhythm is atrial fibrillation with PVCs Low risk scan   08/26/2014  Cardiac Cath:  -Coronary Circulation: Distal LAD: The vessel was small size.  There is a tubular 70% stenosis.  Proximal circumflex: There was a tubular 40% stenosis.  Distal circumflex: The vessel is small size there was a diffuse 50% stent stenosis.  1st  OM: The vessel was medium-sized.  There is a diffuse 50% stenosis RCA: The distal was supplied by limited collaterals from the distal LAD and the distal circumflex.  Proximal RCA: There was a diffuse 99% stenosis. Occluded proximal RCA (chronic) mild to moderate proximal left circumflex, moderate distal left circumflex (small vessel) and moderate proximal OM disease (moderate sized vessel).  LAD proximal stent is patent.  Normal EF estimated at greater than 55%.  No significant MR.  Medical management recommended.   *Suggested restart Plavix.    Laboratory Data:  Chemistry Recent Labs  Lab 2018/06/06 0928  NA 137  K 3.6  CL 101  CO2 26  GLUCOSE  268*  BUN 10  CREATININE 0.84  CALCIUM 10.2  GFRNONAA >60  GFRAA >60  ANIONGAP 10    Recent Labs  Lab 06/05/18 0928  PROT 6.6  ALBUMIN 4.4  AST 22  ALT 12  ALKPHOS 55  BILITOT 1.5*   Hematology Recent Labs  Lab 06/05/18 0928  WBC 6.9  RBC 4.42  HGB 14.2  HCT 38.9*  MCV 88.1  MCH 32.2  MCHC 36.5*  RDW 16.0*  PLT 82*   Cardiac Enzymes Recent Labs  Lab 06/05/18 0928  TROPONINI 0.05*   No results for input(s): TROPIPOC in the last 168 hours.  BNP Recent Labs  Lab 06/05/18 0928  BNP 235.0*    DDimer No results for input(s): DDIMER in the last 168 hours.  Radiology/Studies:  Dg Chest 2 View  Result Date: 06/05/2018 CLINICAL DATA:  Onset of squeezing sub sternal chest and abdominal pain beginning a couple of weeks ago associated with shortness of breath. History of  coronary artery disease and previous MI. Former smoker. EXAM: CHEST - 2 VIEW COMPARISON:  PA and lateral chest x-ray of November 03, 2015. FINDINGS: The lungs are well-expanded. The interstitial markings are coarse. The heart and pulmonary vascularity are normal. The mediastinum is normal in width. There is calcification in the wall of the aortic arch. There is no pleural effusion. There is multilevel degenerative disc disease of the thoracic spine. IMPRESSION: Chronic bronchitic-smoking related changes. No pneumonia, CHF, nor other acute cardiopulmonary abnormality. Thoracic aortic atherosclerosis. Electronically Signed   By: David  Martinique M.D.   On: 06/05/2018 10:16    Assessment and Plan:   NSTEMI with H/o CAD s/p PCI and mildly elevated troponin, current Afib - Current chest pain described as mild aching, Price sided, non-pleuritic, non-positional, non-radiating x3w.  - Atypical in nature but patient also reports as similar to pain experienced before previous stenting.  - No STE. EKG significant for Afib of unknown start date / time.  - Troponin 0.05  0.66 , BNP 235 - Continue to cycle troponin.  - Consider updating A1C, TSH - Cannot r/o cardiac etiology given risk factors and known h/o CAD with PCI.  - Renal function stable with Cr 0.81 and at baseline - Daily BMET, CBC. Continue to monitor renal function and electrolytes, as well as hemoglobin and platelets in setting of known but stable thrombocytopenia. - As above, CT scan shows signs of pericardial effusion. Patient without pericardial friction rub on exam and denies alleviation in pain with leaning forward. Cannot rule out at this time as possible etiology and pending results of today's echo.  - Recommend ASA 81mg , statin zocor 40mg , and nitro PRN as needed for further CP. Consider reducing current ASA 324mg  to 81mg  daily and place on PPI to protect against GI irritation given patient's reported intolerance to ASA.  Continue HCTZ and  monitor renal function. Continue lisinopril 10mg  daily. Consider starting on lopressor over Coreg once admitted.  - If plan for cardiac cath, hold Eliquis before cath and start on heparin - Troponin elevation and cardiac history with PCI as well as risk factors for cardiac etiology. Consider cardiac catheterization during admission to definitively r/o of cardiac etiology as cannot rule out cardiac /ischemic event at this time. Renal function is stable. If cardiac cath planned, will need to place patient on heparin and wait for Eliquis to wash out. Given patient's reported intolerance with antiplatelet therapy in the past, will also need to ensure patient will be compliant with antiplatelet therapy,  which will be prescribed if patient requires cardiac cath intervention. Further recommendations pending decision to cath / Dr. Fletcher Anon to see patient   Paroxysmal Atrial Fibrillation - Currently Afib. Patient is asymptomatic while in Afib and onset of Afib unknown; however, patient reports medication compliance on Eliquis and taking as prescribed at time of last visit with Cody Price in 03/2018. - Consider updating A1C, TSH. Continue to monitor electrolytes -CHA2DS2VASc score of at least 4  (HTN, agex2, DM2). Recommend continue long term anticoagulation at discharge. - Continue po diltiazem 180mg  v lopressor as directly above  - Currently rate controlled. In the future, can consider rhythm control if patient continues to remain anticoagulated.   HTN - BP currently 126/62 - Continue diltiazem 180mg  and titrate as HR and BP allow. - Continue HCTZ 25mg  daily - Continue lisinopril 10mg  daily - Continue to monitor  HLD - Continue Zocor 40mg   - AST ALT within normal range  - Consider updating lipid panel.  - Last known LDL 33 in 2015 - Goal LDL <70  Thrombocytopenia - Previously seen by [facility] hematology - Numbers stable around 60-70 with current plts 80s - Continue to monitor, particularly if undergo  procedures  DM2 with complication, on long term insulin - SSI - A1C 6.1 in 2017. Recommend update A1C - Documented neuropathy - Per IM, PCP    For questions or updates, please contact [place] Please consult [email or web address] for contact info under     Signed, Arvil Chaco, PA-C  06/05/2018 3:22 PM

## 2018-06-05 NOTE — Progress Notes (Signed)
PHARMACIST - PHYSICIAN ORDER COMMUNICATION  CONCERNING: P&T Medication Policy on Herbal Medications  DESCRIPTION:  This patient's order for:   Co Q 10 Caps 200mg    has been noted.  This product(s) is classified as an "herbal" or natural product. Due to a lack of definitive safety studies or FDA approval, nonstandard manufacturing practices, plus the potential risk of unknown drug-drug interactions while on inpatient medications, the Pharmacy and Therapeutics Committee does not permit the use of "herbal" or natural products of this type within [facility].   ACTION TAKEN: The pharmacy department is unable to verify this order at this time. Please reevaluate patient's clinical condition at discharge and address if the herbal or natural product(s) should be resumed at that time.  Pernell Dupre, PharmD, BCPS Clinical Pharmacist 06/05/2018 6:33 PM

## 2018-06-05 NOTE — Progress Notes (Signed)
Family Meeting Note  Advance Directive:{yes  Today a meeting took place with the patient and wife in the emergency room Cody Price came in with chest pain for two weeks on and off. Has chronic back pain with back surgeries, CAD with angioplasty and stent in the past, hypertension, diabetes, morbid obesity being admitted for chest pain rule out. He is chronic a fib on eliquis. Discuss code status with Cody Price with patient's wife in the room. Patient wishes to be full code. Time spent 16 minutes  Fritzi Mandes, MD

## 2018-06-05 NOTE — ED Notes (Signed)
Report given to admitting provider in ed at this time, she states she will inform Sona patel the pt admitting doctor that the pt latest troponin level is .66

## 2018-06-05 NOTE — H&P (Signed)
[name] NAME: Cody Price    MR#:  [id number]  DATE OF BIRTH:  Nov 17, 1940  DATE OF ADMISSION:  06/05/2018  PRIMARY CARE PHYSICIAN: Baxter Hire, MD   REQUESTING/REFERRING PHYSICIAN: Dr. Burlene Arnt  CHIEF COMPLAINT:   Chest pain on and off for two weeks  HISTORY OF PRESENT ILLNESS:  Cody Price  is a 77 y.o. male with a known history of coronary artery disease status post stent in the remote past, diabetes, obesity, hypertension comes to the emergency room with on and off chest discomfort mid substernal for last two weeks. Patient did not call his cardiologist or PCP. He states he is been compliant with his medication. There seems to be issues with noncompliance with diet according to wife. EKG does not show acute ST elevation her depression. She was chronic a fib. Patient on eliquis for anticoagulation. Troponin is .05. ER physician spoke with Dr. Fletcher Anon cardiology recommends admission for rule out. Cardiology also recommend not to start IV heparin.  CT scan of the abdomen was done for abdominal pain on 31 May 2018 which showed interval small pericardial effusion. No signs of cardiac tapenade. No signs of heart failure.  pt is being admitted for further evaluation of management.  My of you stress test was done in June 2019 which showed normal wall motion. EF of 49%. Unable to exclude small region of fixed inferior wall perfusion defect. Low risk scan PAST MEDICAL HISTORY:   Past Medical History:  Diagnosis Date  . Arthritis    degenerative back   . BPH with obstruction/lower urinary tract symptoms   . CAD (coronary artery disease) 2010   stent  . Diabetes mellitus   . ED (erectile dysfunction)   . Elevated PSA   . GERD (gastroesophageal reflux disease)   . Gross hematuria   . H/O CT scan    recent renal CT due to hematuria, cystoscopy - normal    . Hematuria   . History of hiatal hernia   . History of  kidney stones   . HLD (hyperlipidemia)   . Hypertension   . Ischemic heart disease 1991   with angioplasty  . Kidney stone   . Morbid obesity ([place])   . Myocardial infarction ([place])   . Peripheral neuropathy   . Pernicious anemia   . Pneumonia 1995   /w PE,   . Pulmonary embolism ([place])    previous  . Renal cyst   . Sleep apnea    pt. denies sleep apnea, pt. states he has had 2 studies - last one 2011  . Spinal stenosis     PAST SURGICAL HISTOIRY:   Past Surgical History:  Procedure Laterality Date  . APPENDECTOMY    . BACK SURGERY     x3 back surgeries - /w fusioin, 1- [facility]  . bilateral inguinal hernia repair    . cad     stent 2010  . CARDIAC CATHETERIZATION  08/17/2014  . CARPAL TUNNEL RELEASE  2014   bilateral   . CHOLECYSTECTOMY    . LAMINECTOMY      SOCIAL HISTORY:   Social History   Tobacco Use  . Smoking status: Former Smoker    Packs/day: 3.00    Years: 15.00    Pack years: 45.00    Last attempt to quit: 10/18/1985    Years since quitting: 32.6  . Smokeless tobacco: Former Systems developer  . Tobacco comment: tobacco use-  no   Substance Use Topics  . Alcohol use: Yes    Comment: rare    FAMILY HISTORY:   Family History  Problem Relation Age of Onset  . Heart failure Mother   . Heart disease Father   . Heart attack Brother   . Skin cancer Sister   . Kidney disease Neg Hx   . Prostate cancer Neg Hx   . Kidney cancer Neg Hx   . Bladder Cancer Neg Hx     DRUG ALLERGIES:   Allergies  Allergen Reactions  . Amitriptyline   . Lyrica [Pregabalin]   . Metformin Nausea Only  . Neurontin [Gabapentin]   . Nitroglycerin Other (See Comments)    Makes Blood pressure go to low.   . Novocain [Procaine]   . Procaine Hcl Other (See Comments)    Passes out    REVIEW OF SYSTEMS:  Review of Systems  Constitutional: Negative for chills, fever and weight loss.  HENT: Negative for ear discharge, ear pain and nosebleeds.   Eyes: Negative for  blurred vision, pain and discharge.  Respiratory: Negative for sputum production, shortness of breath, wheezing and stridor.   Cardiovascular: Positive for chest pain. Negative for palpitations, orthopnea and PND.  Gastrointestinal: Negative for abdominal pain, diarrhea, nausea and vomiting.  Genitourinary: Negative for frequency and urgency.  Musculoskeletal: Negative for back pain and joint pain.  Neurological: Negative for sensory change, speech change, focal weakness and weakness.  Psychiatric/Behavioral: Negative for depression and hallucinations. The patient is not nervous/anxious.      MEDICATIONS AT HOME:   Prior to Admission medications   Medication Sig Start Date End Date Taking? Authorizing Provider  acetaminophen (TYLENOL) 500 MG tablet Take 500 mg by mouth daily.   Yes [provider]  apixaban (ELIQUIS) 5 MG TABS tablet Take 1 tablet (5 mg total) by mouth 2 (two) times daily. 02/13/18  Yes Minna Merritts, MD  cholecalciferol (VITAMIN D) 1000 units tablet Take 1,000 Units by mouth 2 (two) times daily.   Yes [provider]  Coenzyme Q10 (CO Q-10) 200 MG CAPS Take 200 mg by mouth daily.   Yes [provider]  Cyanocobalamin (VITAMIN B-12 IJ) Inject 1 vial into the muscle every 30 (thirty) days.    Yes [provider]  diazepam (VALIUM) 5 MG tablet Take 5 mg by mouth 3 (three) times daily as needed for anxiety or muscle spasms.    Yes [provider]  diltiazem (CARDIZEM CD) 180 MG 24 hr capsule Take 180 mg by mouth daily before breakfast.    Yes [provider]  glimepiride (AMARYL) 4 MG tablet Take 4 mg by mouth 2 (two) times daily.    Yes [provider]  hydrochlorothiazide 25 MG tablet Take 25 mg by mouth daily.     Yes [provider]  lisinopril (PRINIVIL,ZESTRIL) 10 MG tablet TAKE ONE TABLET BY MOUTH ONCE DAILY Patient taking differently: Take 10 mg by mouth daily.  07/18/15  Yes Gollan, Kathlene November, MD   magnesium oxide (MAG-OX) 400 MG tablet Take 400 mg by mouth daily.   Yes [provider]  omega-3 acid ethyl esters (LOVAZA) 1 g capsule Take 2 g by mouth daily.   Yes [provider]  omeprazole (PRILOSEC) 20 MG capsule Take 20 mg by mouth 2 (two) times daily.    Yes [provider]  oxyCODONE-acetaminophen (PERCOCET) 10-325 MG tablet Take 1 tablet by mouth every 4 (four) hours as needed for pain.  Yes [provider]  sildenafil (VIAGRA) 100 MG tablet Take 100 mg by mouth daily as needed for erectile dysfunction.    Yes [provider]  simvastatin (ZOCOR) 40 MG tablet Take 40 mg by mouth at bedtime.    Yes [provider]  sucralfate (CARAFATE) 1 g tablet 1 g 3 (three) times daily. Patient dissolves this in 1 tablespoon of warm water each time.   Yes [provider]  testosterone cypionate (DEPOTESTOSTERONE CYPIONATE) 200 MG/ML injection Inject 200 mg into the muscle every 28 (twenty-eight) days.    Yes [provider]      VITAL SIGNS:  Blood pressure 126/90, pulse 72, temperature 97.7 F (36.5 C), temperature source Oral, resp. rate 20, height 6\' 1"  (1.854 m), weight 112 kg, SpO2 99 %.  PHYSICAL EXAMINATION:  GENERAL:  77 y.o.-year-old patient lying in the bed with no acute distress.  EYES: Pupils equal, round, reactive to light and accommodation. No scleral icterus. Extraocular muscles intact.  HEENT: Head atraumatic, normocephalic. Oropharynx and nasopharynx clear.  NECK:  Supple, no jugular venous distention. No thyroid enlargement, no tenderness.  LUNGS: Normal breath sounds bilaterally, no wheezing, rales,rhonchi or crepitation. No use of accessory muscles of respiration.  CARDIOVASCULAR: S1, S2 normal. No murmurs, rubs, or gallops.  ABDOMEN: Soft, nontender, nondistended. Bowel sounds present. No organomegaly or mass.  EXTREMITIES: No pedal edema, cyanosis, or clubbing.  NEUROLOGIC: Cranial nerves II  through XII are intact. Muscle strength 5/5 in all extremities. Sensation intact. Gait not checked.  PSYCHIATRIC: The patient is alert and oriented x 3.  SKIN: No obvious rash, lesion, or ulcer.   LABORATORY PANEL:   CBC Recent Labs  Lab 06/05/18 0928  WBC 6.9  HGB 14.2  HCT 38.9*  PLT 82*   ------------------------------------------------------------------------------------------------------------------  Chemistries  Recent Labs  Lab 06/05/18 0928  NA 137  K 3.6  CL 101  CO2 26  GLUCOSE 268*  BUN 10  CREATININE 0.84  CALCIUM 10.2  AST 22  ALT 12  ALKPHOS 55  BILITOT 1.5*   ------------------------------------------------------------------------------------------------------------------  Cardiac Enzymes Recent Labs  Lab 06/05/18 0928  TROPONINI 0.05*   ------------------------------------------------------------------------------------------------------------------  RADIOLOGY:  Dg Chest 2 View  Result Date: 06/05/2018 CLINICAL DATA:  Onset of squeezing sub sternal chest and abdominal pain beginning a couple of weeks ago associated with shortness of breath. History of coronary artery disease and previous MI. Former smoker. EXAM: CHEST - 2 VIEW COMPARISON:  PA and lateral chest x-ray of November 03, 2015. FINDINGS: The lungs are well-expanded. The interstitial markings are coarse. The heart and pulmonary vascularity are normal. The mediastinum is normal in width. There is calcification in the wall of the aortic arch. There is no pleural effusion. There is multilevel degenerative disc disease of the thoracic spine. IMPRESSION: Chronic bronchitic-smoking related changes. No pneumonia, CHF, nor other acute cardiopulmonary abnormality. Thoracic aortic atherosclerosis. Electronically Signed   By: David  Martinique M.D.   On: 06/05/2018 10:16    EKG:   A fib with RVR  no acute ST elevation her depression IMPRESSION AND PLAN:   Cody Price  is a 77 y.o. male with a known  history of coronary artery disease status post stent in the remote past, diabetes, obesity, hypertension comes to the emergency room with on and off chest discomfort mid substernal for last two weeks. Patient did not call his cardiologist or PCP. He states he is been compliant with his medication. There seems to be issues with noncompliance with diet  according to wife. EKG does not show acute ST elevation her depression. She was chronic a fib. Patient on eliquis for anticoagulation. Troponin is .05.  1. chest pain rule out MI. Patient has history of coronary artery disease status post stent and angioplasty in the past -recent myoview stress test in June 2019 was essentially a low risk scan. Normal wall motion. EF 49% -continue aspirin, statins, diltiazem. Lisinopril -patient is intolerant to beta blockers(causes fatigue and he stopped on his own in the past) -cardiology consultation with Dr. Fletcher Anon -echo of the heart   2. type II diabetes continue MRL and sliding scale insulin  3. A fib on eliquis will resume it  4. Hypertension continue hydrochlorothiazide, lisinopril, diltiazem  5. DVT prophylaxis already on eliquis  All the records are reviewed and case discussed with ED provider. Management plans discussed with the patient, family and they are in agreement.  CODE STATUS: full  TOTAL TIME TAKING CARE OF THIS PATIENT: *45* minutes.    Fritzi Mandes M.D on 06/05/2018 at 11:18 AM  Between 7am to 6pm - Pager - [phone]  After 6pm go to [email or web address] - password [email or web address] [facility]  [facility]  Office  [phone]  CC: Primary care physician; Baxter Hire, MD

## 2018-06-05 NOTE — ED Notes (Signed)
Pt asking for pain meds. Lorrie,RN is aware.

## 2018-06-06 ENCOUNTER — Observation Stay (HOSPITAL_COMMUNITY)
Admit: 2018-06-06 | Discharge: 2018-06-06 | Disposition: A | Discharge status: Home / Self Care | Payer: Medicare HMO | Attending: Internal Medicine | Admitting: Internal Medicine

## 2018-06-06 DIAGNOSIS — I340 Nonrheumatic mitral (valve) insufficiency: Secondary | ICD-10-CM | POA: Diagnosis not present

## 2018-06-06 DIAGNOSIS — K219 Gastro-esophageal reflux disease without esophagitis: Secondary | ICD-10-CM | POA: Diagnosis present

## 2018-06-06 DIAGNOSIS — Z87891 Personal history of nicotine dependence: Secondary | ICD-10-CM | POA: Diagnosis not present

## 2018-06-06 DIAGNOSIS — G5790 Unspecified mononeuropathy of unspecified lower limb: Secondary | ICD-10-CM | POA: Diagnosis present

## 2018-06-06 DIAGNOSIS — M4800 Spinal stenosis, site unspecified: Secondary | ICD-10-CM | POA: Diagnosis present

## 2018-06-06 DIAGNOSIS — E669 Obesity, unspecified: Secondary | ICD-10-CM | POA: Diagnosis present

## 2018-06-06 DIAGNOSIS — Z7984 Long term (current) use of oral hypoglycemic drugs: Secondary | ICD-10-CM | POA: Diagnosis not present

## 2018-06-06 DIAGNOSIS — I4821 Permanent atrial fibrillation: Secondary | ICD-10-CM | POA: Diagnosis not present

## 2018-06-06 DIAGNOSIS — I2510 Atherosclerotic heart disease of native coronary artery without angina pectoris: Secondary | ICD-10-CM | POA: Diagnosis present

## 2018-06-06 DIAGNOSIS — Z23 Encounter for immunization: Secondary | ICD-10-CM | POA: Diagnosis present

## 2018-06-06 DIAGNOSIS — Z79890 Hormone replacement therapy (postmenopausal): Secondary | ICD-10-CM | POA: Diagnosis not present

## 2018-06-06 DIAGNOSIS — Z8249 Family history of ischemic heart disease and other diseases of the circulatory system: Secondary | ICD-10-CM | POA: Diagnosis not present

## 2018-06-06 DIAGNOSIS — Z7901 Long term (current) use of anticoagulants: Secondary | ICD-10-CM | POA: Diagnosis not present

## 2018-06-06 DIAGNOSIS — I252 Old myocardial infarction: Secondary | ICD-10-CM | POA: Diagnosis not present

## 2018-06-06 DIAGNOSIS — I10 Essential (primary) hypertension: Secondary | ICD-10-CM | POA: Diagnosis present

## 2018-06-06 DIAGNOSIS — I214 Non-ST elevation (NSTEMI) myocardial infarction: Secondary | ICD-10-CM | POA: Diagnosis present

## 2018-06-06 DIAGNOSIS — E785 Hyperlipidemia, unspecified: Secondary | ICD-10-CM | POA: Diagnosis present

## 2018-06-06 DIAGNOSIS — E1141 Type 2 diabetes mellitus with diabetic mononeuropathy: Secondary | ICD-10-CM | POA: Diagnosis present

## 2018-06-06 DIAGNOSIS — D696 Thrombocytopenia, unspecified: Secondary | ICD-10-CM | POA: Diagnosis present

## 2018-06-06 DIAGNOSIS — N529 Male erectile dysfunction, unspecified: Secondary | ICD-10-CM | POA: Diagnosis present

## 2018-06-06 DIAGNOSIS — Z955 Presence of coronary angioplasty implant and graft: Secondary | ICD-10-CM | POA: Diagnosis not present

## 2018-06-06 DIAGNOSIS — R079 Chest pain, unspecified: Secondary | ICD-10-CM | POA: Diagnosis present

## 2018-06-06 DIAGNOSIS — Z6832 Body mass index (BMI) 32.0-32.9, adult: Secondary | ICD-10-CM | POA: Diagnosis not present

## 2018-06-06 LAB — APTT
APTT: 71 s — AB (ref 24–36)
aPTT: 91 seconds — ABNORMAL HIGH (ref 24–36)

## 2018-06-06 LAB — CBC
HEMATOCRIT: 39 % — AB (ref 40.0–52.0)
HEMOGLOBIN: 14 g/dL (ref 13.0–18.0)
MCH: 32 pg (ref 26.0–34.0)
MCHC: 35.8 g/dL (ref 32.0–36.0)
MCV: 89.3 fL (ref 80.0–100.0)
Platelets: 80 10*3/uL — ABNORMAL LOW (ref 150–440)
RBC: 4.37 MIL/uL — AB (ref 4.40–5.90)
RDW: 16 % — ABNORMAL HIGH (ref 11.5–14.5)
WBC: 7.7 10*3/uL (ref 3.8–10.6)

## 2018-06-06 LAB — GLUCOSE, CAPILLARY
Glucose-Capillary: 110 mg/dL — ABNORMAL HIGH (ref 70–99)
Glucose-Capillary: 180 mg/dL — ABNORMAL HIGH (ref 70–99)
Glucose-Capillary: 165 mg/dL — ABNORMAL HIGH (ref 70–99)
Glucose-Capillary: 113 mg/dL — ABNORMAL HIGH (ref 70–99)

## 2018-06-06 LAB — ECHOCARDIOGRAM COMPLETE
Height: 73 in
Weight: 3958.4 oz

## 2018-06-06 LAB — HEPARIN LEVEL (UNFRACTIONATED): Heparin Unfractionated: 1.65 IU/mL — ABNORMAL HIGH (ref 0.30–0.70)

## 2018-06-06 MED ORDER — SODIUM CHLORIDE 0.9 % IV SOLN: 250.0000 mL | INTRAVENOUS | Status: DC | PRN

## 2018-06-06 MED ORDER — SODIUM CHLORIDE 0.9 % WEIGHT BASED INFUSION
3.0000 mL/kg/h | INTRAVENOUS | Status: DC
  Administered 2018-06-07: 3 mL/kg/h via INTRAVENOUS

## 2018-06-06 MED ORDER — ASPIRIN 81 MG PO CHEW
81.0000 mg | CHEWABLE_TABLET | Freq: Every day | ORAL | Status: DC
  Administered 2018-06-06: 81 mg via ORAL
  Filled 2018-06-06: qty 1

## 2018-06-06 MED ORDER — SODIUM CHLORIDE 0.9% FLUSH: 3.0000 mL | INTRAVENOUS | Status: DC | PRN

## 2018-06-06 MED ORDER — INSULIN ASPART 100 UNIT/ML ~~LOC~~ SOLN
0.0000 [IU] | Freq: Three times a day (TID) | SUBCUTANEOUS | Status: DC
  Administered 2018-06-06 – 2018-06-07 (×2): 2 [IU] via SUBCUTANEOUS
  Administered 2018-06-07 – 2018-06-08 (×2): 1 [IU] via SUBCUTANEOUS
  Filled 2018-06-06 (×4): qty 1

## 2018-06-06 MED ORDER — SUCRALFATE 1 GM/10ML PO SUSP
1.0000 g | Freq: Three times a day (TID) | ORAL | Status: DC
  Administered 2018-06-06 – 2018-06-08 (×5): 1 g via ORAL
  Filled 2018-06-06 (×10): qty 10

## 2018-06-06 MED ORDER — ATORVASTATIN CALCIUM 20 MG PO TABS
40.0000 mg | ORAL_TABLET | Freq: Every day | ORAL | Status: DC
  Administered 2018-06-06 – 2018-06-07 (×2): 40 mg via ORAL
  Filled 2018-06-06 (×2): qty 2

## 2018-06-06 MED ORDER — SODIUM CHLORIDE 0.9 % WEIGHT BASED INFUSION
1.0000 mL/kg/h | INTRAVENOUS | Status: DC
  Administered 2018-06-07: 1 mL/kg/h via INTRAVENOUS

## 2018-06-06 MED ORDER — INSULIN ASPART 100 UNIT/ML ~~LOC~~ SOLN: 0.0000 [IU] | Freq: Every day | SUBCUTANEOUS | Status: DC

## 2018-06-06 MED ORDER — ASPIRIN 81 MG PO CHEW
81.0000 mg | CHEWABLE_TABLET | ORAL | Status: AC
  Administered 2018-06-07: 81 mg via ORAL
  Filled 2018-06-06: qty 1

## 2018-06-06 MED ORDER — SODIUM CHLORIDE 0.9% FLUSH
3.0000 mL | Freq: Two times a day (BID) | INTRAVENOUS | Status: DC
  Administered 2018-06-06 (×2): 3 mL via INTRAVENOUS

## 2018-06-06 NOTE — Progress Notes (Signed)
Patient ID: Cody Price, male   DOB: Jan 18, 1941, 77 y.o.   MRN: [id number]  [facility] PROGRESS NOTE  ULYSEE FYOCK TKW:[id number] DOB: [birth year] DOA: 06/05/2018 PCP: Baxter Hire, MD  HPI/Subjective: Patient states his chest pain is in the center of his chest.  Comes and goes but never goes away.  Right now about 6 out of 10 intensity.  Sometimes worse after moving around and after food.  He does not think this is acid reflux.  His gallbladder is already removed. His cardiac enzyme was elevated.  Objective: Vitals:   06/06/18 0436 06/06/18 1000  BP: 105/67 110/65  Pulse: 71 77  Resp:    Temp: 97.7 F (36.5 C) 97.8 F (36.6 C)  SpO2: 100% 100%    Intake/Output Summary (Last 24 hours) at 06/06/2018 1411 Last data filed at 06/06/2018 0800 Gross per 24 hour  Intake 167.69 ml  Output 675 ml  Net -507.31 ml   Filed Weights   06/05/18 0927 06/05/18 1844  Weight: 112 kg 112.2 kg    ROS: Review of Systems  Constitutional: Negative for chills and fever.  Eyes: Negative for blurred vision.  Respiratory: Positive for shortness of breath. Negative for cough.   Cardiovascular: Positive for chest pain.  Gastrointestinal: Positive for abdominal pain. Negative for constipation, diarrhea, nausea and vomiting.  Genitourinary: Negative for dysuria.  Musculoskeletal: Negative for joint pain.  Neurological: Negative for dizziness and headaches.   Exam: Physical Exam  Constitutional: He is oriented to person, place, and time.  HENT:  Nose: No mucosal edema.  Mouth/Throat: No oropharyngeal exudate or posterior oropharyngeal edema.  Eyes: Pupils are equal, round, and reactive to light. Conjunctivae, EOM and lids are normal.  Neck: No JVD present. Carotid bruit is not present. No edema present. No thyroid mass and no thyromegaly present.  Cardiovascular: S1 normal and S2 normal. Exam reveals no gallop.  No murmur heard. Pulses:      Dorsalis pedis pulses are 2+ on the right  side, and 2+ on the left side.  Respiratory: No respiratory distress. He has decreased breath sounds in the right lower field and the left lower field. He has no wheezes. He has no rhonchi. He has no rales.  GI: Soft. Bowel sounds are normal. There is no tenderness.  Musculoskeletal:       Right ankle: He exhibits swelling.       Left ankle: He exhibits swelling.  Lymphadenopathy:    He has no cervical adenopathy.  Neurological: He is alert and oriented to person, place, and time. No cranial nerve deficit.  Skin: Skin is warm. No rash noted. Nails show no clubbing.  Psychiatric: He has a normal mood and affect.      Data Reviewed: Basic Metabolic Panel: Recent Labs  Lab 06/05/18 0928 06/05/18 1546  NA 137  --   K 3.6  --   CL 101  --   CO2 26  --   GLUCOSE 268*  --   BUN 10  --   CREATININE 0.84 0.81  CALCIUM 10.2  --    Liver Function Tests: Recent Labs  Lab 06/05/18 0928  AST 22  ALT 12  ALKPHOS 55  BILITOT 1.5*  PROT 6.6  ALBUMIN 4.4   CBC: Recent Labs  Lab 06/05/18 0928 06/06/18 1157  WBC 6.9 7.7  HGB 14.2 14.0  HCT 38.9* 39.0*  MCV 88.1 89.3  PLT 82* 80*   Cardiac Enzymes: Recent Labs  Lab 06/05/18 0928 06/05/18  1546 06/05/18 1831 06/05/18 2117  TROPONINI 0.05* 0.66* 0.60* 0.54*   BNP (last 3 results) Recent Labs    06/05/18 0928  BNP 235.0*    CBG: Recent Labs  Lab 06/05/18 1540 06/05/18 2114 06/06/18 0750 06/06/18 1151  GLUCAP 105* 148* 110* 180*     Studies: Dg Chest 2 View  Result Date: 06/05/2018 CLINICAL DATA:  Onset of squeezing sub sternal chest and abdominal pain beginning a couple of weeks ago associated with shortness of breath. History of coronary artery disease and previous MI. Former smoker. EXAM: CHEST - 2 VIEW COMPARISON:  PA and lateral chest x-ray of November 03, 2015. FINDINGS: The lungs are well-expanded. The interstitial markings are coarse. The heart and pulmonary vascularity are normal. The mediastinum is  normal in width. There is calcification in the wall of the aortic arch. There is no pleural effusion. There is multilevel degenerative disc disease of the thoracic spine. IMPRESSION: Chronic bronchitic-smoking related changes. No pneumonia, CHF, nor other acute cardiopulmonary abnormality. Thoracic aortic atherosclerosis. Electronically Signed   By: David  Martinique M.D.   On: 06/05/2018 10:16    Scheduled Meds: . [START ON 06/07/2018] aspirin  81 mg Oral Pre-Cath  . aspirin  81 mg Oral Daily  . cholecalciferol  1,000 Units Oral BID  . diltiazem  180 mg Oral QAC breakfast  . glimepiride  4 mg Oral BID  . hydrochlorothiazide  25 mg Oral Daily  . Influenza vac split quadrivalent PF  0.5 mL Intramuscular Tomorrow-1000  . insulin aspart  0-9 Units Subcutaneous TID WC  . lisinopril  10 mg Oral Daily  . magnesium oxide  400 mg Oral Daily  . omega-3 acid ethyl esters  2 g Oral Daily  . pantoprazole  40 mg Oral Daily  . simvastatin  40 mg Oral QHS  . sodium chloride flush  3 mL Intravenous Q12H   Continuous Infusions: . sodium chloride    . [START ON 06/07/2018] sodium chloride     Followed by  . [START ON 06/07/2018] sodium chloride    . heparin 1,250 Units/hr (06/06/18 0710)    Assessment/Plan:  1. NSTEMI. Hx CAD.  Troponin rise from 0.05 to0.66 and then stayed flat after that.  Patient on heparin drip.  Holding Eliquis for cardiac catheterization for tomorrow.  Continue aspirin and statin. 2. Chronic atrial fibrillation on Eliquis at home.  Heparin drip while here. 3. Hyperlipidemia unspecified on Zocor 4. Type 2 diabetes mellitus.  Hold glimepiride for tomorrow morning.  Sliding scale for now. 5. Hypertension on hydrochlorothiazide lisinopril and diltiazem 6. GERD on PPI  Code Status:     Code Status Orders  (From admission, onward)         Start     Ordered   06/05/18 1534  Full code  Continuous     06/05/18 1533        Code Status History    Date Active Date Inactive Code  Status Order ID Comments User Context   11/03/2015 1306 11/06/2015 2018 Full Code [id number]  Demetrios Loll, MD Inpatient    Advance Directive Documentation     Most Recent Value  Type of Advance Directive  Living will  Pre-existing out of facility DNR order (yellow form or pink MOST form)  -  "MOST" Form in Place?  -     Family Communication: Permission to speak in front of family at the bedside Disposition Plan: To be determined based on cardiac cath results  Consultants:  Cardiology  Time spent: 28 minutes  [place]

## 2018-06-06 NOTE — Consult Note (Signed)
ANTICOAGULATION CONSULT NOTE -  Follow up [facility] for heparin dosing. Indication: chest pain/ACS  Allergies  Allergen Reactions  . Amitriptyline   . Lyrica [Pregabalin]   . Metformin Nausea Only  . Neurontin [Gabapentin]   . Nitroglycerin Other (See Comments)    Makes Blood pressure go to low.   . Novocain [Procaine]   . Procaine Hcl Other (See Comments)    Passes out    Patient Measurements: Height: 6\' 1"  (185.4 cm) Weight: 247 lb 6.4 oz (112.2 kg) IBW/kg (Calculated) : 79.9 Heparin Dosing Weight: 103.5  Vital Signs: Temp: 97.8 F (36.6 C) (10/01 1000) Temp Source: Oral (10/01 0436) BP: 110/65 (10/01 1000) Pulse Rate: 77 (10/01 1000)  Labs: Recent Labs    06/05/18 0928 06/05/18 1546 06/05/18 1831 06/05/18 1907 06/05/18 2117 06/06/18 0345 06/06/18 1157  HGB 14.2  --   --   --   --   --  14.0  HCT 38.9*  --   --   --   --   --  39.0*  PLT 82*  --   --   --   --   --  80*  APTT  --   --   --  40*  --  91* 71*  LABPROT 17.0*  --   --   --   --   --   --   INR 1.40  --   --   --   --   --   --   HEPARINUNFRC  --   --   --  2.24*  --  1.65*  --   CREATININE 0.84 0.81  --   --   --   --   --   TROPONINI 0.05* 0.66* 0.60*  --  0.54*  --   --     Estimated Creatinine Clearance: 101.8 mL/min (by C-G formula based on SCr of 0.81 mg/dL).   Assessment: Patient previously on apixaban 5 mg twice a day for a.fib with last dose recorded at 0700 today 06/05/18.  Heparin Course: Will bolus with 4000 units then will start heparin infusion at 1250 units/hr.  10/01 aPTT 91, heparin level 1.65. Continue current regimen. Recheck aPTT, heparin level, and CBC with tomorrow AM labs  Goal of Therapy:  Heparin level 0.3-0.7 units/ml aPTT 66-102 seconds Monitor platelets by anticoagulation protocol: Yes   Plan:  10/1 1200 aPTT 71, therapeutic x2. Continue heparin drip at current rate. Will check aPTT, HL and CBC in AM. Hgb and plt count stable.   Rayna Sexton L 06/06/2018,12:56 PM

## 2018-06-06 NOTE — Care Management Note (Signed)
Case Management Note  Patient Details  Name: Cody Price MRN: [id number] Date of Birth: Aug 24, 1941  Subjective/Objective:             Independent in all adls, denies issues accessing medical care, obtaining medications or with transportation.  Current with PCP.  No discharge needs identified at present by care manager or members of care team.  Will have cardiac cath tomorrow.  Currently on room air.  Chronic Eliquis.          Action/Plan:   Expected Discharge Date:                  Expected Discharge Plan:     In-House Referral:     Discharge planning Services  CM Consult  Post Acute Care Choice:    Choice offered to:     DME Arranged:    DME Agency:     HH Arranged:    HH Agency:     Status of Service:  In process, will continue to follow  If discussed at Long Length of Stay Meetings, dates discussed:    Additional Comments:  Elza Rafter, RN 06/06/2018, 5:37 PM

## 2018-06-06 NOTE — Progress Notes (Signed)
*  PRELIMINARY RESULTS* Echocardiogram 2D Echocardiogram has been performed.  Sherrie Sport 06/06/2018, 10:54 AM

## 2018-06-06 NOTE — Plan of Care (Signed)
  Problem: Activity: Goal: Risk for activity intolerance will decrease Outcome: Progressing   Problem: Nutrition: Goal: Adequate nutrition will be maintained Outcome: Progressing   Problem: Coping: Goal: Level of anxiety will decrease Outcome: Progressing Note:  Valium given once for some anxiety, did help   Problem: Elimination: Goal: Will not experience complications related to urinary retention Outcome: Progressing   Problem: Safety: Goal: Ability to remain free from injury will improve Outcome: Progressing   Problem: Cardiac: Goal: Ability to achieve and maintain adequate cardiovascular perfusion will improve Outcome: Progressing Note:  Pt started on a heparin gtt   Problem: Pain Managment: Goal: General experience of comfort will improve Outcome: Not Progressing Note:  Pt still continues to have pain, pt has chronic back pain and states 6 is his goal number, also having some chest pain, treated twice for pain with percocet & oxy IR, which helps   Problem: Education: Goal: Knowledge of General Education information will improve Description Including pain rating scale, medication(s)/side effects and non-pharmacologic comfort measures Outcome: Completed/Met   Problem: Clinical Measurements: Goal: Will remain free from infection Outcome: Not Applicable

## 2018-06-06 NOTE — Progress Notes (Signed)
Progress Note  Patient Name: Cody Price Date of Encounter: 06/06/2018  Primary Cardiologist: Ida Rogue, MD  Subjective   He continues to have moderate lower sternal chest discomfort.  This worsens slightly with deep breathing but otw hasn't changed much since yesterday.  This type of discomfort has been present for three wks - usually worse in the AM and only slightly better in the afternoons.  No change w/ food, position changes, or palpation.  Inpatient Medications    Scheduled Meds: . cholecalciferol  1,000 Units Oral BID  . diltiazem  180 mg Oral QAC breakfast  . glimepiride  4 mg Oral BID  . hydrochlorothiazide  25 mg Oral Daily  . Influenza vac split quadrivalent PF  0.5 mL Intramuscular Tomorrow-1000  . insulin aspart  0-9 Units Subcutaneous TID WC  . lisinopril  10 mg Oral Daily  . magnesium oxide  400 mg Oral Daily  . omega-3 acid ethyl esters  2 g Oral Daily  . pantoprazole  40 mg Oral Daily  . simvastatin  40 mg Oral QHS   Continuous Infusions: . heparin 1,250 Units/hr (06/06/18 0710)   PRN Meds: acetaminophen, diazepam, morphine injection, ondansetron (ZOFRAN) IV, oxyCODONE-acetaminophen **AND** oxyCODONE   Vital Signs    Vitals:   06/05/18 1835 06/05/18 1844 06/05/18 2009 06/06/18 0436  BP: 109/65  114/67 105/67  Pulse: (!) 57  75 71  Resp:      Temp: 97.7 F (36.5 C)  97.9 F (36.6 C) 97.7 F (36.5 C)  TempSrc: Oral  Oral Oral  SpO2: 99%  97% 100%  Weight:  112.2 kg    Height:  6\' 1"  (1.854 m)      Intake/Output Summary (Last 24 hours) at 06/06/2018 0813 Last data filed at 06/06/2018 0710 Gross per 24 hour  Intake 167.69 ml  Output 575 ml  Net -407.31 ml   Filed Weights   06/05/18 0927 06/05/18 1844  Weight: 112 kg 112.2 kg    Physical Exam   GEN: Well nourished, well developed, in no acute distress.  HEENT: Grossly normal.  Neck: Supple, no JVD, carotid bruits, or masses. Cardiac: Irreg irreg, no murmurs, rubs, or gallops. No  clubbing, cyanosis, edema.  Radials/DP/PT 1+ and equal bilaterally.  Respiratory:  Respirations regular and unlabored, clear to auscultation bilaterally. GI: Soft, nontender, nondistended, BS + x 4. MS: no deformity or atrophy. Skin: warm and dry, no rash. Neuro:  Strength and sensation are intact. Psych: AAOx3.  Normal affect.  Labs    Chemistry Recent Labs  Lab 06/05/18 0928 06/05/18 1546  NA 137  --   K 3.6  --   CL 101  --   CO2 26  --   GLUCOSE 268*  --   BUN 10  --   CREATININE 0.84 0.81  CALCIUM 10.2  --   PROT 6.6  --   ALBUMIN 4.4  --   AST 22  --   ALT 12  --   ALKPHOS 55  --   BILITOT 1.5*  --   GFRNONAA >60 >60  GFRAA >60 >60  ANIONGAP 10  --      Hematology Recent Labs  Lab 06/05/18 0928  WBC 6.9  RBC 4.42  HGB 14.2  HCT 38.9*  MCV 88.1  MCH 32.2  MCHC 36.5*  RDW 16.0*  PLT 82*    Cardiac Enzymes Recent Labs  Lab 06/05/18 0928 06/05/18 1546 06/05/18 1831 06/05/18 2117  TROPONINI 0.05* 0.66* 0.60* 0.54*  BNP Recent Labs  Lab 06/05/18 0928  BNP 235.0*     Radiology    Dg Chest 2 View  Result Date: 06/05/2018 CLINICAL DATA:  Onset of squeezing sub sternal chest and abdominal pain beginning a couple of weeks ago associated with shortness of breath. History of coronary artery disease and previous MI. Former smoker. EXAM: CHEST - 2 VIEW COMPARISON:  PA and lateral chest x-ray of November 03, 2015. FINDINGS: The lungs are well-expanded. The interstitial markings are coarse. The heart and pulmonary vascularity are normal. The mediastinum is normal in width. There is calcification in the wall of the aortic arch. There is no pleural effusion. There is multilevel degenerative disc disease of the thoracic spine. IMPRESSION: Chronic bronchitic-smoking related changes. No pneumonia, CHF, nor other acute cardiopulmonary abnormality. Thoracic aortic atherosclerosis. Electronically Signed   By: David  Martinique M.D.   On: 06/05/2018 10:16     Telemetry    Afib, 80's - Personally Reviewed  Cardiac Studies   02/2018 stress test that showed normal wall motion, EF 49%, and ruled a low risk scan though unable to exclude a small region of fixed inferior wall perfusion defect   Patient Profile     77 y.o. male known to [facility] with a hx of CAD s/p PCI (1991 PTCA to RCA later occluded 100%, 2010 DES to LAD ,2015 cath with recommendation for plavix), paroxysmal Afib, HTN, HLD, DM2 with lower extremity neuropathy, spinal stenosis s/p surgery,GERD, and prior smoker who was admitted 9/30 with a 3 wk h/o constant chest pain with worsening abd and back pain, and was found to have an elevated troponin.  Assessment & Plan    1. NSTEMI/CAD:  Pt presented 9/30 with a three wk h/o constant chest pain that worsened and became associated with abd and back pain.  CT abd/pelvis w/o acute findings.  Trop 0.05  0.66  0.60  0.54.  He continues to have chest pain that is sl worse with deep breathing.  Tele stable - rate controlled afib.  No new ST changes on admission ecg to suggest new ischemia or pericarditis.  As previously planned, eliquis is on hold and he is scheduled for diagnostic cath in the AM.  His pain is atypical for angina given prolonged duration. He tells me that he is also scheduled for an EGD/colonoscopy in November. Cont heparin, statin. Will add asa for now, though he was not on @ home previously.  2.   Permanent atrial fibrillation:  Rate controlled.  On eliquis @ home in setting of CHA2DS2VASc of 5.  This is on hold pending cath. Cont heparin.  3.  Essential HTN:  Stable on acei and dilt.  4.  HL:  Cont statin.  LDL 34 in 08/2017 w/ nl lft's on admission.  5.  DM II:  Insulin per IM.  6.  GERD:  Cont PPI.  Due for EGD in November.  ? Role in symptoms over the past three wks.  Also reports anorexia and wt loss over the past 3 months (10 lbs).  Signed, Murray Hodgkins, NP  06/06/2018, 8:13 AM    For questions or updates, please  contact   Please consult [email or web address] for contact info under Cardiology/STEMI.

## 2018-06-06 NOTE — Consult Note (Signed)
ANTICOAGULATION CONSULT NOTE - Initial Consult  Pharmacy Consult for heparin dosing. Indication: chest pain/ACS  Allergies  Allergen Reactions  . Amitriptyline   . Lyrica [Pregabalin]   . Metformin Nausea Only  . Neurontin [Gabapentin]   . Nitroglycerin Other (See Comments)    Makes Blood pressure go to low.   . Novocain [Procaine]   . Procaine Hcl Other (See Comments)    Passes out    Patient Measurements: Height: 6\' 1"  (185.4 cm) Weight: 247 lb 6.4 oz (112.2 kg) IBW/kg (Calculated) : 79.9 Heparin Dosing Weight: 103.5  Vital Signs: Temp: 97.7 F (36.5 C) (10/01 0436) Temp Source: Oral (10/01 0436) BP: 105/67 (10/01 0436) Pulse Rate: 71 (10/01 0436)  Labs: Recent Labs    06/05/18 0928 06/05/18 1546 06/05/18 1831 06/05/18 1907 06/05/18 2117 06/06/18 0345  HGB 14.2  --   --   --   --   --   HCT 38.9*  --   --   --   --   --   PLT 82*  --   --   --   --   --   APTT  --   --   --  40*  --  91*  LABPROT 17.0*  --   --   --   --   --   INR 1.40  --   --   --   --   --   HEPARINUNFRC  --   --   --  2.24*  --  1.65*  CREATININE 0.84 0.81  --   --   --   --   TROPONINI 0.05* 0.66* 0.60*  --  0.54*  --     Estimated Creatinine Clearance: 101.8 mL/min (by C-G formula based on SCr of 0.81 mg/dL).   Medical History: Past Medical History:  Diagnosis Date  . Arthritis    degenerative back   . BPH with obstruction/lower urinary tract symptoms   . CAD (coronary artery disease) 2010   stent  . Diabetes mellitus   . ED (erectile dysfunction)   . Elevated PSA   . GERD (gastroesophageal reflux disease)   . Gross hematuria   . H/O CT scan    recent renal CT due to hematuria, cystoscopy - normal    . Hematuria   . History of hiatal hernia   . History of kidney stones   . HLD (hyperlipidemia)   . Hypertension   . Ischemic heart disease 1991   with angioplasty  . Kidney stone   . Morbid obesity ([place])   . Myocardial infarction ([place])   . Peripheral neuropathy   .  Pernicious anemia   . Pneumonia 1995   /w PE,   . Pulmonary embolism ([place])    previous  . Renal cyst   . Sleep apnea    pt. denies sleep apnea, pt. states he has had 2 studies - last one 2011  . Spinal stenosis     Medications:  Medications Prior to Admission  Medication Sig Dispense Refill Last Dose  . acetaminophen (TYLENOL) 500 MG tablet Take 500 mg by mouth daily.   06/05/2018 at am  . apixaban (ELIQUIS) 5 MG TABS tablet Take 1 tablet (5 mg total) by mouth 2 (two) times daily. 60 tablet 6 06/05/2018 at 0700  . cholecalciferol (VITAMIN D) 1000 units tablet Take 1,000 Units by mouth 2 (two) times daily.   06/05/2018 at am  . Coenzyme Q10 (CO Q-10) 200 MG CAPS Take 200  mg by mouth daily.   06/05/2018 at am  . Cyanocobalamin (VITAMIN B-12 IJ) Inject 1 vial into the muscle every 30 (thirty) days.    Past Week at Unknown time  . diazepam (VALIUM) 5 MG tablet Take 5 mg by mouth 3 (three) times daily as needed for anxiety or muscle spasms.    unknown at unknown  . diltiazem (CARDIZEM CD) 180 MG 24 hr capsule Take 180 mg by mouth daily before breakfast.    06/05/2018 at am  . glimepiride (AMARYL) 4 MG tablet Take 4 mg by mouth 2 (two) times daily.    06/05/2018 at am  . hydrochlorothiazide 25 MG tablet Take 25 mg by mouth daily.     06/05/2018 at am  . lisinopril (PRINIVIL,ZESTRIL) 10 MG tablet TAKE ONE TABLET BY MOUTH ONCE DAILY (Patient taking differently: Take 10 mg by mouth daily. ) 90 tablet 2 06/05/2018 at am  . magnesium oxide (MAG-OX) 400 MG tablet Take 400 mg by mouth daily.   06/05/2018 at am  . omega-3 acid ethyl esters (LOVAZA) 1 g capsule Take 2 g by mouth daily.   06/05/2018 at am  . omeprazole (PRILOSEC) 20 MG capsule Take 20 mg by mouth 2 (two) times daily.    06/05/2018 at am  . oxyCODONE-acetaminophen (PERCOCET) 10-325 MG tablet Take 1 tablet by mouth every 4 (four) hours as needed for pain.    unknown at unknown  . sildenafil (VIAGRA) 100 MG tablet Take 100 mg by mouth daily as needed  for erectile dysfunction.    unknown at unknown  . simvastatin (ZOCOR) 40 MG tablet Take 40 mg by mouth at bedtime.    06/04/2018 at pm  . sucralfate (CARAFATE) 1 g tablet 1 g 3 (three) times daily. Patient dissolves this in 1 tablespoon of warm water each time.  0 06/05/2018 at am  . testosterone cypionate (DEPOTESTOSTERONE CYPIONATE) 200 MG/ML injection Inject 200 mg into the muscle every 28 (twenty-eight) days.    Past Week at Unknown time    Assessment: Patient previously on apixaban 5 mg twice a day for a.fib with last dose recorded at 0700 today 06/05/18.  Goal of Therapy:  Heparin level 0.3-0.7 units/ml aPTT 66-102 seconds Monitor platelets by anticoagulation protocol: Yes   Plan:  Will bolus with 4000 units then will start heparin infusion at 1250 units/hr. Check anti-Xa level every 8 hours until both aPPT and heparin level correlate to therapeutic range.  Heparin level will be drawn daily. Continue to monitor H&H and platelets  10/01 aPTT 91, heparin level 1.65. Continue current regimen. Recheck aPTT, heparin level, and CBC with tomorrow AM labs.  Cody Price 06/06/2018,7:01 AM

## 2018-06-06 NOTE — Telephone Encounter (Signed)
Acceptable risk to hold anticoagulation 3 days prior to procedure if needed Would restart following procedure

## 2018-06-06 NOTE — H&P (View-Only) (Signed)
Progress Note  Patient Name: Cody Price Date of Encounter: 06/06/2018  Primary Cardiologist: Ida Rogue, MD  Subjective   He continues to have moderate lower sternal chest discomfort.  This worsens slightly with deep breathing but otw hasn't changed much since yesterday.  This type of discomfort has been present for three wks - usually worse in the AM and only slightly better in the afternoons.  No change w/ food, position changes, or palpation.  Inpatient Medications    Scheduled Meds: . cholecalciferol  1,000 Units Oral BID  . diltiazem  180 mg Oral QAC breakfast  . glimepiride  4 mg Oral BID  . hydrochlorothiazide  25 mg Oral Daily  . Influenza vac split quadrivalent PF  0.5 mL Intramuscular Tomorrow-1000  . insulin aspart  0-9 Units Subcutaneous TID WC  . lisinopril  10 mg Oral Daily  . magnesium oxide  400 mg Oral Daily  . omega-3 acid ethyl esters  2 g Oral Daily  . pantoprazole  40 mg Oral Daily  . simvastatin  40 mg Oral QHS   Continuous Infusions: . heparin 1,250 Units/hr (06/06/18 0710)   PRN Meds: acetaminophen, diazepam, morphine injection, ondansetron (ZOFRAN) IV, oxyCODONE-acetaminophen **AND** oxyCODONE   Vital Signs    Vitals:   06/05/18 1835 06/05/18 1844 06/05/18 2009 06/06/18 0436  BP: 109/65  114/67 105/67  Pulse: (!) 57  75 71  Resp:      Temp: 97.7 F (36.5 C)  97.9 F (36.6 C) 97.7 F (36.5 C)  TempSrc: Oral  Oral Oral  SpO2: 99%  97% 100%  Weight:  112.2 kg    Height:  6\' 1"  (1.854 m)      Intake/Output Summary (Last 24 hours) at 06/06/2018 0813 Last data filed at 06/06/2018 0710 Gross per 24 hour  Intake 167.69 ml  Output 575 ml  Net -407.31 ml   Filed Weights   06/05/18 0927 06/05/18 1844  Weight: 112 kg 112.2 kg    Physical Exam   GEN: Well nourished, well developed, in no acute distress.  HEENT: Grossly normal.  Neck: Supple, no JVD, carotid bruits, or masses. Cardiac: Irreg irreg, no murmurs, rubs, or gallops. No  clubbing, cyanosis, edema.  Radials/DP/PT 1+ and equal bilaterally.  Respiratory:  Respirations regular and unlabored, clear to auscultation bilaterally. GI: Soft, nontender, nondistended, BS + x 4. MS: no deformity or atrophy. Skin: warm and dry, no rash. Neuro:  Strength and sensation are intact. Psych: AAOx3.  Normal affect.  Labs    Chemistry Recent Labs  Lab 06/05/18 0928 06/05/18 1546  NA 137  --   K 3.6  --   CL 101  --   CO2 26  --   GLUCOSE 268*  --   BUN 10  --   CREATININE 0.84 0.81  CALCIUM 10.2  --   PROT 6.6  --   ALBUMIN 4.4  --   AST 22  --   ALT 12  --   ALKPHOS 55  --   BILITOT 1.5*  --   GFRNONAA >60 >60  GFRAA >60 >60  ANIONGAP 10  --      Hematology Recent Labs  Lab 06/05/18 0928  WBC 6.9  RBC 4.42  HGB 14.2  HCT 38.9*  MCV 88.1  MCH 32.2  MCHC 36.5*  RDW 16.0*  PLT 82*    Cardiac Enzymes Recent Labs  Lab 06/05/18 0928 06/05/18 1546 06/05/18 1831 06/05/18 2117  TROPONINI 0.05* 0.66* 0.60* 0.54*  BNP Recent Labs  Lab 06/05/18 0928  BNP 235.0*     Radiology    Dg Chest 2 View  Result Date: 06/05/2018 CLINICAL DATA:  Onset of squeezing sub sternal chest and abdominal pain beginning a couple of weeks ago associated with shortness of breath. History of coronary artery disease and previous MI. Former smoker. EXAM: CHEST - 2 VIEW COMPARISON:  PA and lateral chest x-ray of November 03, 2015. FINDINGS: The lungs are well-expanded. The interstitial markings are coarse. The heart and pulmonary vascularity are normal. The mediastinum is normal in width. There is calcification in the wall of the aortic arch. There is no pleural effusion. There is multilevel degenerative disc disease of the thoracic spine. IMPRESSION: Chronic bronchitic-smoking related changes. No pneumonia, CHF, nor other acute cardiopulmonary abnormality. Thoracic aortic atherosclerosis. Electronically Signed   By: David  Martinique M.D.   On: 06/05/2018 10:16     Telemetry    Afib, 80's - Personally Reviewed  Cardiac Studies   02/2018 stress test that showed normal wall motion, EF 49%, and ruled a low risk scan though unable to exclude a small region of fixed inferior wall perfusion defect   Patient Profile     77 y.o. male known to [facility] with a hx of CAD s/p PCI (1991 PTCA to RCA later occluded 100%, 2010 DES to LAD ,2015 cath with recommendation for plavix), paroxysmal Afib, HTN, HLD, DM2 with lower extremity neuropathy, spinal stenosis s/p surgery,GERD, and prior smoker who was admitted 9/30 with a 3 wk h/o constant chest pain with worsening abd and back pain, and was found to have an elevated troponin.  Assessment & Plan    1. NSTEMI/CAD:  Pt presented 9/30 with a three wk h/o constant chest pain that worsened and became associated with abd and back pain.  CT abd/pelvis w/o acute findings.  Trop 0.05  0.66  0.60  0.54.  He continues to have chest pain that is sl worse with deep breathing.  Tele stable - rate controlled afib.  No new ST changes on admission ecg to suggest new ischemia or pericarditis.  As previously planned, eliquis is on hold and he is scheduled for diagnostic cath in the AM.  His pain is atypical for angina given prolonged duration. He tells me that he is also scheduled for an EGD/colonoscopy in November. Cont heparin, statin. Will add asa for now, though he was not on @ home previously.  2.   Permanent atrial fibrillation:  Rate controlled.  On eliquis @ home in setting of CHA2DS2VASc of 5.  This is on hold pending cath. Cont heparin.  3.  Essential HTN:  Stable on acei and dilt.  4.  HL:  Cont statin.  LDL 34 in 08/2017 w/ nl lft's on admission.  5.  DM II:  Insulin per IM.  6.  GERD:  Cont PPI.  Due for EGD in November.  ? Role in symptoms over the past three wks.  Also reports anorexia and wt loss over the past 3 months (10 lbs).  Signed, Murray Hodgkins, NP  06/06/2018, 8:13 AM    For questions or updates, please  contact   Please consult [email or web address] for contact info under Cardiology/STEMI.

## 2018-06-06 NOTE — Progress Notes (Signed)
Pt complains of nausea with no vomitus. PRN zofran will be given. I will continue to assess.

## 2018-06-06 NOTE — Progress Notes (Signed)
RN spoke to [employer], [place]. Plan is cardiac cath on Wed. Orders for heart carb diet received. I will continue to assess.

## 2018-06-07 ENCOUNTER — Encounter
Admission: EM | Disposition: A | Discharge status: Home / Self Care | Payer: Self-pay | Source: Home / Self Care | Attending: Internal Medicine

## 2018-06-07 ENCOUNTER — Encounter: Payer: Self-pay | Admitting: *Deleted

## 2018-06-07 HISTORY — PX: CORONARY STENT INTERVENTION: CATH118234

## 2018-06-07 HISTORY — PX: LEFT HEART CATH AND CORONARY ANGIOGRAPHY: CATH118249

## 2018-06-07 LAB — GLUCOSE, CAPILLARY
GLUCOSE-CAPILLARY: 123 mg/dL — AB (ref 70–99)
GLUCOSE-CAPILLARY: 156 mg/dL — AB (ref 70–99)
Glucose-Capillary: 138 mg/dL — ABNORMAL HIGH (ref 70–99)
Glucose-Capillary: 110 mg/dL — ABNORMAL HIGH (ref 70–99)
Glucose-Capillary: 158 mg/dL — ABNORMAL HIGH (ref 70–99)

## 2018-06-07 LAB — POCT ACTIVATED CLOTTING TIME
ACTIVATED CLOTTING TIME: 268 s
ACTIVATED CLOTTING TIME: 274 s

## 2018-06-07 LAB — BASIC METABOLIC PANEL
Anion gap: 8 (ref 5–15)
BUN: 13 mg/dL (ref 8–23)
CO2: 28 mmol/L (ref 22–32)
Calcium: 10 mg/dL (ref 8.9–10.3)
Chloride: 103 mmol/L (ref 98–111)
Creatinine, Ser: 0.71 mg/dL (ref 0.61–1.24)
GFR calc Af Amer: >60 mL/min (ref >60)
GFR calc non Af Amer: >60 mL/min (ref >60)
Glucose, Bld: 120 mg/dL — ABNORMAL HIGH (ref 70–99)
Potassium: 3.5 mmol/L (ref 3.5–5.1)
Sodium: 139 mmol/L (ref 135–145)

## 2018-06-07 LAB — CBC
HCT: 36.8 % — ABNORMAL LOW (ref 40.0–52.0)
Hemoglobin: 13.5 g/dL (ref 13.0–18.0)
MCH: 32.3 pg (ref 26.0–34.0)
MCHC: 36.6 g/dL — ABNORMAL HIGH (ref 32.0–36.0)
MCV: 88.3 fL (ref 80.0–100.0)
Platelets: 80 10*3/uL — ABNORMAL LOW (ref 150–440)
RBC: 4.17 MIL/uL — ABNORMAL LOW (ref 4.40–5.90)
RDW: 16.8 % — ABNORMAL HIGH (ref 11.5–14.5)
WBC: 7.2 10*3/uL (ref 3.8–10.6)

## 2018-06-07 LAB — APTT: aPTT: 71 seconds — ABNORMAL HIGH (ref 24–36)

## 2018-06-07 LAB — HEPARIN LEVEL (UNFRACTIONATED)
HEPARIN UNFRACTIONATED: 0.42 [IU]/mL (ref 0.30–0.70)
Heparin Unfractionated: 0.61 IU/mL (ref 0.30–0.70)

## 2018-06-07 SURGERY — LEFT HEART CATH AND CORONARY ANGIOGRAPHY
Anesthesia: Moderate Sedation

## 2018-06-07 MED ORDER — CLOPIDOGREL BISULFATE 75 MG PO TABS
75.0000 mg | ORAL_TABLET | Freq: Every day | ORAL | Status: DC
  Administered 2018-06-08: 75 mg via ORAL
  Filled 2018-06-07: qty 1

## 2018-06-07 MED ORDER — ASPIRIN 81 MG PO CHEW: 81.0000 mg | CHEWABLE_TABLET | Freq: Every day | ORAL | Status: DC

## 2018-06-07 MED ORDER — OXYCODONE HCL 5 MG PO TABS
ORAL_TABLET | ORAL | Status: AC
  Filled 2018-06-07: qty 1

## 2018-06-07 MED ORDER — LABETALOL HCL 5 MG/ML IV SOLN: 10.0000 mg | INTRAVENOUS | Status: AC | PRN

## 2018-06-07 MED ORDER — HEPARIN SODIUM (PORCINE) 1000 UNIT/ML IJ SOLN
INTRAMUSCULAR | Status: AC
  Filled 2018-06-07: qty 1

## 2018-06-07 MED ORDER — ADULT MULTIVITAMIN W/MINERALS CH
1.0000 | ORAL_TABLET | Freq: Every day | ORAL | Status: DC
  Administered 2018-06-07: 1 via ORAL
  Filled 2018-06-07 (×2): qty 1

## 2018-06-07 MED ORDER — VERAPAMIL HCL 2.5 MG/ML IV SOLN
INTRAVENOUS | Status: DC | PRN
  Administered 2018-06-07: 2.5 mg via INTRAVENOUS

## 2018-06-07 MED ORDER — HEPARIN (PORCINE) IN NACL 2000-0.9 UNIT/L-% IV SOLN
INTRAVENOUS | Status: DC | PRN
  Administered 2018-06-07: 7000 mL

## 2018-06-07 MED ORDER — VERAPAMIL HCL 2.5 MG/ML IV SOLN
INTRAVENOUS | Status: AC
  Filled 2018-06-07: qty 2

## 2018-06-07 MED ORDER — NITROGLYCERIN 1 MG/10 ML FOR IR/CATH LAB
INTRA_ARTERIAL | Status: DC | PRN
  Administered 2018-06-07: 200 ug via INTRA_ARTERIAL
  Administered 2018-06-07: 100 ug via INTRA_ARTERIAL

## 2018-06-07 MED ORDER — ASPIRIN 81 MG PO CHEW
CHEWABLE_TABLET | ORAL | Status: AC
  Filled 2018-06-07: qty 1

## 2018-06-07 MED ORDER — NITROGLYCERIN 5 MG/ML IV SOLN
INTRAVENOUS | Status: AC
  Filled 2018-06-07: qty 10

## 2018-06-07 MED ORDER — CLOPIDOGREL BISULFATE 75 MG PO TABS
ORAL_TABLET | ORAL | Status: DC | PRN
  Administered 2018-06-07: 600 mg via ORAL

## 2018-06-07 MED ORDER — HEPARIN SODIUM (PORCINE) 1000 UNIT/ML IJ SOLN
INTRAMUSCULAR | Status: DC | PRN
  Administered 2018-06-07: 2000 [IU] via INTRAVENOUS
  Administered 2018-06-07: 5000 [IU] via INTRAVENOUS

## 2018-06-07 MED ORDER — CLOPIDOGREL BISULFATE 75 MG PO TABS
ORAL_TABLET | ORAL | Status: AC
  Filled 2018-06-07: qty 8

## 2018-06-07 MED ORDER — FENTANYL CITRATE (PF) 100 MCG/2ML IJ SOLN
INTRAMUSCULAR | Status: DC | PRN
  Administered 2018-06-07: 25 ug via INTRAVENOUS

## 2018-06-07 MED ORDER — HEPARIN (PORCINE) IN NACL 1000-0.9 UT/500ML-% IV SOLN: INTRAVENOUS | Status: DC | PRN

## 2018-06-07 MED ORDER — SODIUM CHLORIDE 0.9 % IV SOLN
INTRAVENOUS | Status: AC
  Administered 2018-06-07: 15:00:00 via INTRAVENOUS

## 2018-06-07 MED ORDER — HEPARIN (PORCINE) IN NACL 1000-0.9 UT/500ML-% IV SOLN
INTRAVENOUS | Status: AC
  Filled 2018-06-07: qty 1000

## 2018-06-07 MED ORDER — HYDRALAZINE HCL 20 MG/ML IJ SOLN: 5.0000 mg | INTRAMUSCULAR | Status: AC | PRN

## 2018-06-07 MED ORDER — ENSURE ENLIVE PO LIQD: 237.0000 mL | ORAL | Status: DC

## 2018-06-07 MED ORDER — SODIUM CHLORIDE 0.9% FLUSH
3.0000 mL | Freq: Two times a day (BID) | INTRAVENOUS | Status: DC
  Administered 2018-06-07 (×2): 3 mL via INTRAVENOUS

## 2018-06-07 MED ORDER — SODIUM CHLORIDE 0.9% FLUSH: 3.0000 mL | INTRAVENOUS | Status: DC | PRN

## 2018-06-07 MED ORDER — IOPAMIDOL (ISOVUE-300) INJECTION 61%
INTRAVENOUS | Status: DC | PRN
  Administered 2018-06-07: 130 mL via INTRA_ARTERIAL

## 2018-06-07 MED ORDER — MIDAZOLAM HCL 2 MG/2ML IJ SOLN
INTRAMUSCULAR | Status: AC
  Filled 2018-06-07: qty 2

## 2018-06-07 MED ORDER — HEPARIN (PORCINE) IN NACL 100-0.45 UNIT/ML-% IJ SOLN
1250.0000 [IU]/h | INTRAMUSCULAR | Status: DC
  Administered 2018-06-07: 1250 [IU]/h via INTRAVENOUS
  Filled 2018-06-07: qty 250

## 2018-06-07 MED ORDER — SODIUM CHLORIDE 0.9 % IV SOLN: 250.0000 mL | INTRAVENOUS | Status: DC | PRN

## 2018-06-07 MED ORDER — HEPARIN (PORCINE) IN NACL 100-0.45 UNIT/ML-% IJ SOLN: 1250.0000 [IU]/h | INTRAMUSCULAR | Status: DC

## 2018-06-07 MED ORDER — POTASSIUM CHLORIDE CRYS ER 20 MEQ PO TBCR
40.0000 meq | EXTENDED_RELEASE_TABLET | Freq: Once | ORAL | Status: AC
  Administered 2018-06-07: 40 meq via ORAL
  Filled 2018-06-07: qty 2

## 2018-06-07 MED ORDER — FENTANYL CITRATE (PF) 100 MCG/2ML IJ SOLN
INTRAMUSCULAR | Status: AC
  Filled 2018-06-07: qty 2

## 2018-06-07 MED ORDER — MIDAZOLAM HCL 2 MG/2ML IJ SOLN
INTRAMUSCULAR | Status: DC | PRN
  Administered 2018-06-07: 1 mg via INTRAVENOUS

## 2018-06-07 SURGICAL SUPPLY — 18 items
BALLN MINITREK RX 2.0X12 (BALLOONS) ×3
BALLN ~~LOC~~ TREK RX 2.75X12 (BALLOONS) ×3
BALLOON MINITREK RX 2.0X12 (BALLOONS) ×1 IMPLANT
BALLOON ~~LOC~~ TREK RX 2.75X12 (BALLOONS) ×1 IMPLANT
CATH INFINITI 5 FR JL3.5 (CATHETERS) ×3 IMPLANT
CATH INFINITI JR4 5F (CATHETERS) ×6 IMPLANT
CATH LAUNCHER 6FR AL1 (CATHETERS) ×1 IMPLANT
CATHETER LAUNCHER 6FR AL1 (CATHETERS) ×3
DEVICE INFLAT 30 PLUS (MISCELLANEOUS) ×3 IMPLANT
DEVICE RAD TR BAND REGULAR (VASCULAR PRODUCTS) ×3 IMPLANT
GLIDESHEATH SLEND SS 6F .021 (SHEATH) ×3 IMPLANT
KIT MANI 3VAL PERCEP (MISCELLANEOUS) ×3 IMPLANT
PACK CARDIAC CATH (CUSTOM PROCEDURE TRAY) ×3 IMPLANT
STENT RESOLUTE ONYX 2.5X18 (Permanent Stent) ×3 IMPLANT
WIRE ASAHI PROWATER 180CM (WIRE) ×3 IMPLANT
WIRE HITORQ VERSACORE ST 145CM (WIRE) ×3 IMPLANT
WIRE ROSEN-J .035X260CM (WIRE) ×3 IMPLANT
WIRE RUNTHROUGH .014X180CM (WIRE) ×3 IMPLANT

## 2018-06-07 NOTE — Progress Notes (Signed)
Patient ID: Cody Price, male   DOB: 09-05-1941, 77 y.o.   MRN: [id number]  [facility] PROGRESS NOTE  Cody Price:[id number] DOB: 1941/05/29 DOA: 06/05/2018 PCP: Baxter Hire, MD  HPI/Subjective: Patient feeling better with regards to his chest pain.  He was seen in the recovery area after cardiac cath and stent in the LAD.  Patient states that he still has problems with acid reflux.  He was hoping to go home today and he wants to see me tomorrow morning  Objective: Vitals:   06/07/18 1345 06/07/18 1406  BP:  (!) 118/56  Pulse:  62  Resp: 13   Temp:  (!) 97.5 F (36.4 C)  SpO2:  95%    Filed Weights   06/05/18 0927 06/05/18 1844 06/07/18 0917  Weight: 112 kg 112.2 kg 112.2 kg    ROS: Review of Systems  Constitutional: Negative for chills and fever.  Eyes: Negative for blurred vision.  Respiratory: Negative for cough and shortness of breath.   Cardiovascular: Negative for chest pain.  Gastrointestinal: Positive for abdominal pain. Negative for constipation, diarrhea, nausea and vomiting.  Genitourinary: Negative for dysuria.  Musculoskeletal: Negative for joint pain.  Neurological: Negative for dizziness and headaches.   Exam: Physical Exam  Constitutional: He is oriented to person, place, and time.  HENT:  Nose: No mucosal edema.  Mouth/Throat: No oropharyngeal exudate or posterior oropharyngeal edema.  Eyes: Pupils are equal, round, and reactive to light. Conjunctivae, EOM and lids are normal.  Neck: No JVD present. Carotid bruit is not present. No edema present. No thyroid mass and no thyromegaly present.  Cardiovascular: S1 normal and S2 normal. Exam reveals no gallop.  No murmur heard. Pulses:      Dorsalis pedis pulses are 2+ on the right side, and 2+ on the left side.  Respiratory: No respiratory distress. He has no decreased breath sounds. He has no wheezes. He has no rhonchi. He has no rales.  GI: Soft. Bowel sounds are normal. There is no  tenderness.  Musculoskeletal:       Right ankle: He exhibits swelling.       Left ankle: He exhibits swelling.  Lymphadenopathy:    He has no cervical adenopathy.  Neurological: He is alert and oriented to person, place, and time. No cranial nerve deficit.  Skin: Skin is warm. No rash noted. Nails show no clubbing.  Psychiatric: He has a normal mood and affect.      Data Reviewed: Basic Metabolic Panel: Recent Labs  Lab 06/05/18 0928 06/05/18 1546 06/07/18 0517  NA 137  --  139  K 3.6  --  3.5  CL 101  --  103  CO2 26  --  28  GLUCOSE 268*  --  120*  BUN 10  --  13  CREATININE 0.84 0.81 0.71  CALCIUM 10.2  --  10.0   Liver Function Tests: Recent Labs  Lab 06/05/18 0928  AST 22  ALT 12  ALKPHOS 55  BILITOT 1.5*  PROT 6.6  ALBUMIN 4.4   CBC: Recent Labs  Lab 06/05/18 0928 06/06/18 1157 06/07/18 0517  WBC 6.9 7.7 7.2  HGB 14.2 14.0 13.5  HCT 38.9* 39.0* 36.8*  MCV 88.1 89.3 88.3  PLT 82* 80* 80*   Cardiac Enzymes: Recent Labs  Lab 06/05/18 0928 06/05/18 1546 06/05/18 1831 06/05/18 2117  TROPONINI 0.05* 0.66* 0.60* 0.54*   BNP (last 3 results) Recent Labs    06/05/18 0928  BNP 235.0*  CBG: Recent Labs  Lab 06/06/18 1637 06/06/18 2103 06/07/18 0737 06/07/18 0914 06/07/18 1146  GLUCAP 165* 113* 138* 123* 110*      Scheduled Meds: . aspirin  81 mg Oral Daily  . atorvastatin  40 mg Oral QHS  . cholecalciferol  1,000 Units Oral BID  . [START ON 06/08/2018] clopidogrel  75 mg Oral Q breakfast  . diltiazem  180 mg Oral QAC breakfast  . Influenza vac split quadrivalent PF  0.5 mL Intramuscular Tomorrow-1000  . insulin aspart  0-5 Units Subcutaneous QHS  . insulin aspart  0-9 Units Subcutaneous TID WC  . lisinopril  10 mg Oral Daily  . magnesium oxide  400 mg Oral Daily  . omega-3 acid ethyl esters  2 g Oral Daily  . pantoprazole  40 mg Oral Daily  . sodium chloride flush  3 mL Intravenous Q12H  . sucralfate  1 g Oral TID WC & HS    Continuous Infusions: . sodium chloride 75 mL/hr at 06/07/18 1438  . sodium chloride    . heparin 1,250 Units/hr (06/07/18 1532)    Assessment/Plan:  1. NSTEMI. Hx CAD.  Patient status post cardiac catheterization today with stent in the LAD.  Continue aspirin and statin.  Plavix added.  Patient not on beta-blocker currently will discuss with cardiology about this. 2. Chronic atrial fibrillation on Eliquis at home.  Heparin drip held while going for cardiac catheterization.  Likely can resume Eliquis tomorrow. 3. Hyperlipidemia unspecified.  Change to Lipitor.  Check lipid profile tomorrow 4. Type 2 diabetes mellitus.  Hold glimepiride. Sliding scale for now. 5. Hypertension on hydrochlorothiazide lisinopril and diltiazem 6. GERD on PPI and Carafate.  States he is still good to go for procedure with Dr. Vira Agar next month.  I told him that cardiology does not like to stop the Plavix after stent for 6 months and that this is not a good idea to do this procedure next month.  Code Status:     Code Status Orders  (From admission, onward)         Start     Ordered   06/05/18 1534  Full code  Continuous     06/05/18 1533        Code Status History    Date Active Date Inactive Code Status Order ID Comments User Context   11/03/2015 1306 11/06/2015 2018 Full Code [id number]  Demetrios Loll, MD Inpatient    Advance Directive Documentation     Most Recent Value  Type of Advance Directive  Living will  Pre-existing out of facility DNR order (yellow form or pink MOST form)  -  "MOST" Form in Place?  -     Family Communication: Permission to speak in front of family in recovery area Disposition Plan: Evaluate tomorrow morning after cardiology for potential discharge  Consultants:  Cardiology  Time spent: 27 minutes  [place]

## 2018-06-07 NOTE — Progress Notes (Signed)
Pt returns from cardiac cath s/p stent placement. Right radial site is clean, dry and intact. No hematoma noted. VSS. I will continue to assess.

## 2018-06-07 NOTE — Progress Notes (Signed)
Progress Note  Patient Name: Cody Price Date of Encounter: 06/07/2018  Primary Cardiologist: Ida Rogue, MD   Subjective   Continues to have constant lower sternal chest pain.  NPO for cath today.  Inpatient Medications    Scheduled Meds: . aspirin  81 mg Oral Daily  . atorvastatin  40 mg Oral QHS  . cholecalciferol  1,000 Units Oral BID  . diltiazem  180 mg Oral QAC breakfast  . Influenza vac split quadrivalent PF  0.5 mL Intramuscular Tomorrow-1000  . insulin aspart  0-5 Units Subcutaneous QHS  . insulin aspart  0-9 Units Subcutaneous TID WC  . lisinopril  10 mg Oral Daily  . magnesium oxide  400 mg Oral Daily  . omega-3 acid ethyl esters  2 g Oral Daily  . pantoprazole  40 mg Oral Daily  . potassium chloride  40 mEq Oral Once  . sodium chloride flush  3 mL Intravenous Q12H  . sucralfate  1 g Oral TID WC & HS   Continuous Infusions: . sodium chloride    . sodium chloride 1 mL/kg/hr (06/07/18 0534)  . heparin 1,250 Units/hr (06/07/18 0648)   PRN Meds: sodium chloride, acetaminophen, diazepam, morphine injection, ondansetron (ZOFRAN) IV, oxyCODONE-acetaminophen **AND** oxyCODONE, sodium chloride flush   Vital Signs    Vitals:   06/06/18 1636 06/06/18 2005 06/07/18 0537 06/07/18 0735  BP: 117/73 (!) 148/68 112/60 119/70  Pulse: 64 69 68 (!) 43  Resp:  18  18  Temp: 97.8 F (36.6 C) 97.8 F (36.6 C) 98.1 F (36.7 C) 97.8 F (36.6 C)  TempSrc: Oral Oral Oral Oral  SpO2: 95% 96% 98% 97%  Weight:      Height:        Intake/Output Summary (Last 24 hours) at 06/07/2018 0758 Last data filed at 06/07/2018 0713 Gross per 24 hour  Intake 800.51 ml  Output 1650 ml  Net -849.49 ml   Filed Weights   06/05/18 0927 06/05/18 1844  Weight: 112 kg 112.2 kg    Telemetry    Afib, 60's, pvc's - Personally Reviewed  Physical Exam   GEN: No acute distress.   Neck: No JVD Cardiac: IR, IR, no murmurs, rubs, or gallops.  Respiratory: Clear to auscultation  bilaterally. GI: Soft, nontender, non-distended  MS: No edema; No deformity. Neuro:  Nonfocal  Psych: Normal affect   Labs    Chemistry Recent Labs  Lab 06/05/18 0928 06/05/18 1546 06/07/18 0517  NA 137  --  139  K 3.6  --  3.5  CL 101  --  103  CO2 26  --  28  GLUCOSE 268*  --  120*  BUN 10  --  13  CREATININE 0.84 0.81 0.71  CALCIUM 10.2  --  10.0  PROT 6.6  --   --   ALBUMIN 4.4  --   --   AST 22  --   --   ALT 12  --   --   ALKPHOS 55  --   --   BILITOT 1.5*  --   --   GFRNONAA >60 >60 >60  GFRAA >60 >60 >60  ANIONGAP 10  --  8     Hematology Recent Labs  Lab 06/05/18 0928 06/06/18 1157 06/07/18 0517  WBC 6.9 7.7 7.2  RBC 4.42 4.37* 4.17*  HGB 14.2 14.0 13.5  HCT 38.9* 39.0* 36.8*  MCV 88.1 89.3 88.3  MCH 32.2 32.0 32.3  MCHC 36.5* 35.8 36.6*  RDW 16.0*  16.0* 16.8*  PLT 82* 80* 80*    Cardiac Enzymes Recent Labs  Lab 06/05/18 0928 06/05/18 1546 06/05/18 1831 06/05/18 2117  TROPONINI 0.05* 0.66* 0.60* 0.54*    BNP Recent Labs  Lab 06/05/18 0928  BNP 235.0*     Radiology    Dg Chest 2 View  Result Date: 06/05/2018 CLINICAL DATA:  Onset of squeezing sub sternal chest and abdominal pain beginning a couple of weeks ago associated with shortness of breath. History of coronary artery disease and previous MI. Former smoker. EXAM: CHEST - 2 VIEW COMPARISON:  PA and lateral chest x-ray of November 03, 2015. FINDINGS: The lungs are well-expanded. The interstitial markings are coarse. The heart and pulmonary vascularity are normal. The mediastinum is normal in width. There is calcification in the wall of the aortic arch. There is no pleural effusion. There is multilevel degenerative disc disease of the thoracic spine. IMPRESSION: Chronic bronchitic-smoking related changes. No pneumonia, CHF, nor other acute cardiopulmonary abnormality. Thoracic aortic atherosclerosis. Electronically Signed   By: David  Martinique M.D.   On: 06/05/2018 10:16    Cardiac  Studies   2D Echocardiogram 10.1.2019  Study Conclusions   - Left ventricle: The cavity size was normal. There was moderate   focal basal hypertrophy of the septum. Systolic function was   normal. The estimated ejection fraction was in the range of 55%   to 60%. There is hypokinesis of the basalinferior myocardium.   Doppler parameters are consistent with restrictive physiology,   indicative of decreased left ventricular diastolic compliance   and/or increased left atrial pressure. Doppler parameters are   consistent with high ventricular filling pressure. - Mitral valve: Mildly thickened leaflets . There was moderate   regurgitation. - Left atrium: The atrium was mildly dilated. - Right ventricle: The cavity size was normal. Wall thickness was   normal. Systolic function was normal. - Right atrium: The atrium was mildly dilated. - Pulmonary arteries: Systolic pressure was moderately increased,   in the range of 40 mm Hg to 45 mm Hg plus central venous/right   atrial pressure. - Pericardium, extracardiac: A trivial pericardial effusion was   identified posterior to the heart.   Patient Profile     77 [facility] a hx ofCAD s/p PCI (1991 PTCA to RCA later occluded 100%, 2010DES toLAD ,2015 cathwith recommendation for plavix), paroxysmal Afib, HTN, HLD, DM2 with lower extremity neuropathy, spinal stenosis s/p surgery,GERD, and prior smokerwho was admitted 9/30 with a 3 wk h/o constant chest pain with worsening abd and back pain, and was found to have an elevated troponin.  Assessment & Plan    1. NSTEMI/CAD: Admitted 9/30 w/ a three wk h/o constant chest pain that worsened and became associated w/ abd and back pain.  He now says that it also worsened w/ exertion.  CT Abd/pelvis w/o acute findings. Trop 0.05  0.66  0.60  0.54. He continyues to have c/p that is sl worse w/ deep breathing. Tele stable. Eliquis remains on hold and he has been heparinized.  Plan for  cath today.  The patient understands that risks include but are not limited to stroke (1 in 1000), death (1 in 68), kidney failure [usually temporary] (1 in 500), bleeding (1 in 200), allergic reaction [possibly serious] (1 in 200), and agrees to proceed. Cont asa/statin.  2.  Permanent Afib:  Rate remains well controlled on home dose of dilt.  Cont heparin for now w/ plan to transition back to eliquis post-cath.  3.  Essential HTN: stable on acei/dilt.  4.  HL:  LDL 34 08/2017. Nl lft's. Cont statin.  5.  DMII:  Insulin per IM.  6.  GERD:  Cont PPI.  Plan for egd/colonoscopy in November.  For questions or updates, please contact [place] Please consult [email or web address] for contact info under        Signed, Murray Hodgkins, NP  06/07/2018, 7:58 AM

## 2018-06-07 NOTE — Interval H&P Note (Signed)
History and Physical Interval Note:  06/07/2018 9:52 AM  Cody Price  has presented today for cardiac catheterization, with the diagnosis of NSTEMI. The various methods of treatment have been discussed with the patient and family. After consideration of risks, benefits and other options for treatment, the patient has consented to  Procedure(s): LEFT HEART CATH AND CORONARY ANGIOGRAPHY (N/A) as a surgical intervention .  The patient's history has been reviewed, patient examined, no change in status, stable for surgery.  I have reviewed the patient's chart and labs.  Questions were answered to the patient's satisfaction.    Cath Lab Visit (complete for each Cath Lab visit)  Clinical Evaluation Leading to the Procedure:   ACS: Yes.    Non-ACS:  N/A  Isay Perleberg

## 2018-06-07 NOTE — Consult Note (Signed)
ANTICOAGULATION CONSULT NOTE -  Follow up [place] for heparin dosing. Indication: chest pain/ACS  Allergies  Allergen Reactions  . Amitriptyline   . Lyrica [Pregabalin]   . Metformin Nausea Only  . Neurontin [Gabapentin]   . Nitroglycerin Other (See Comments)    Makes Blood pressure go to low.   . Novocain [Procaine]   . Procaine Hcl Other (See Comments)    Passes out    Patient Measurements: Height: 6\' 1"  (185.4 cm) Weight: 247 lb 5.7 oz (112.2 kg) IBW/kg (Calculated) : 79.9 Heparin Dosing Weight: 103.5  Vital Signs: Temp: 97.7 F (36.5 C) (10/02 0917) Temp Source: Oral (10/02 0917) BP: 115/56 (10/02 1300) Pulse Rate: 60 (10/02 1315)  Labs: Recent Labs    06/05/18 0928 06/05/18 1546 06/05/18 1831  06/05/18 1907 06/05/18 2117 06/06/18 0345 06/06/18 1157 06/07/18 0517  HGB 14.2  --   --   --   --   --   --  14.0 13.5  HCT 38.9*  --   --   --   --   --   --  39.0* 36.8*  PLT 82*  --   --   --   --   --   --  80* 80*  APTT  --   --   --    < > 40*  --  91* 71* 71*  LABPROT 17.0*  --   --   --   --   --   --   --   --   INR 1.40  --   --   --   --   --   --   --   --   HEPARINUNFRC  --   --   --   --  2.24*  --  1.65*  --  0.61  CREATININE 0.84 0.81  --   --   --   --   --   --  0.71  TROPONINI 0.05* 0.66* 0.60*  --   --  0.54*  --   --   --    < > = values in this interval not displayed.    Estimated Creatinine Clearance: 103.1 mL/min (by C-G formula based on SCr of 0.71 mg/dL).   Assessment: Patient previously on apixaban 5 mg twice a day for a.fib with last dose recorded at 0700 today 06/05/18.  Heparin Course: Will bolus with 4000 units then will start heparin infusion at 1250 units/hr.  10/01 aPTT 91, heparin level 1.65. Continue current regimen. Recheck aPTT, heparin level, and CBC with tomorrow AM labs  10/1 1200 aPTT 71, therapeutic x2. Continue heparin drip at current rate. Will check aPTT, HL and CBC in AM. Hgb and plt count  stable.   Goal of Therapy:  Heparin level 0.3-0.7 units/ml aPTT 66-102 seconds Monitor platelets by anticoagulation protocol: Yes   Plan: Heparin infusion was held this morning during cardiac catheterization. Pharmacy consulted post cath to resume heparin infusion two hours post TR band deflation. TR band deflated to 0 mL at 13:15 per post procedure assessment documentation. Will restart heparin infusion at 15:15 at previous therapeutic rate of 12.5 mL/hr. Check heparin level 8 hours after resuming heparin infusion.   Laural Benes, Pharm.D., BCPS Clinical Pharmacist 06/07/2018,1:32 PM

## 2018-06-07 NOTE — Telephone Encounter (Signed)
Copy of phone encounter faxed to Tammi Klippel, [place] at [facility] via [facility].

## 2018-06-07 NOTE — Progress Notes (Signed)
Initial Nutrition Assessment  DOCUMENTATION CODES:   Not applicable  INTERVENTION:  Liberalize diet to encourage PO intake MVI Ensure Enlive q 24: To provide 350kcal 20g per serving   NUTRITION DIAGNOSIS:   Inadequate oral intake related to acute illness as evidenced by estimated needs(NPO x 1 day for procedure, current diet order insufficent to meet needs).   GOAL:   Patient will meet greater than or equal to 90% of their needs  MONITOR:   PO intake, Diet advancement, Supplement acceptance  REASON FOR ASSESSMENT:   Malnutrition Screening Tool    ASSESSMENT:   77 y.o. male with a known history of coronary artery disease status post stent in the remote past, diabetes, obesity, hypertension comes to the emergency room with on and off chest discomfort mid substernal for last two weeks.  Pt s/p catherization at visit, wife and daughter in room. Pt reports eating 3 meals/day and a snack before bread. Breakfast is usually ham/sausage/bacon biscuit w/ a fried eggs and coffee. Lunch is 2-3 hotdogs w/ must, chili, onion. Dinner is fried fish/chicken, sometimes steak. Snacks on peanut butter crackers before bed. Pt stated all greens constipate him, especially leafy greens. Pt reports liking green beans if they are cooked correctly (w/fat back).  Spoke w/ pt and family about following a heart healthy; pt stated he was going to enjoy the foods that he likes and not interested in making adjustments to eating patterns.   Pt reports chronic constipation and finds relief from drinking prune juice. Educating pt on adding fiber supplement such as Metamucil to his water to aid in productive BM's. Last BM was 9/30 per pt report.   Medications Reviewed: vitD, novoLog (0-9) w/meals, Mag-ox, Omega-3, protonix  Lab Reviewed: Glucose 120 (H)  NUTRITION - FOCUSED PHYSICAL EXAM:    Most Recent Value  Orbital Region  No depletion  Upper Arm Region  No depletion  Thoracic and Lumbar Region  No  depletion  Buccal Region  No depletion  Temple Region  No depletion  Clavicle Bone Region  No depletion  Clavicle and Acromion Bone Region  No depletion  Scapular Bone Region  No depletion  Dorsal Hand  No depletion  Patellar Region  No depletion  Anterior Thigh Region  No depletion  Posterior Calf Region  No depletion  Edema (RD Assessment)  None  Hair  Reviewed  Eyes  Reviewed  Mouth  Reviewed  Skin  Reviewed  Nails  Reviewed       Diet Order:   Diet Order            Diet Carb Modified Fluid consistency: Thin; Room service appropriate? Yes  Diet effective now              EDUCATION NEEDS:   No education needs have been identified at this time  Skin:  Skin Assessment: Reviewed RN Assessment  Last BM:  9/30  Height:   Ht Readings from Last 1 Encounters:  06/07/18 6\' 1"  (1.854 m)    Weight:   Wt Readings from Last 1 Encounters:  06/07/18 112.2 kg    Ideal Body Weight:  83.6 kg  BMI:  Body mass index is 32.63 kg/m.  Estimated Nutritional Needs:   Kcal:  2090-2200 (25-26 IBW)  Protein:  120-135g  Fluid:  2.2L    Lajuan Lines, RD, LDN  After Hours/Weekend Pager: [phone]

## 2018-06-07 NOTE — Consult Note (Signed)
ANTICOAGULATION CONSULT NOTE -  Follow up [place] for heparin dosing. Indication: chest pain/ACS  Allergies  Allergen Reactions  . Amitriptyline   . Lyrica [Pregabalin]   . Metformin Nausea Only  . Neurontin [Gabapentin]   . Nitroglycerin Other (See Comments)    Makes Blood pressure go to low.   . Novocain [Procaine]   . Procaine Hcl Other (See Comments)    Passes out    Patient Measurements: Height: 6\' 1"  (185.4 cm) Weight: 247 lb 6.4 oz (112.2 kg) IBW/kg (Calculated) : 79.9 Heparin Dosing Weight: 103.5  Vital Signs: Temp: 97.8 F (36.6 C) (10/02 0735) Temp Source: Oral (10/02 0735) BP: 119/70 (10/02 0735) Pulse Rate: 43 (10/02 0735)  Labs: Recent Labs    06/05/18 0928 06/05/18 1546 06/05/18 1831  06/05/18 1907 06/05/18 2117 06/06/18 0345 06/06/18 1157 06/07/18 0517  HGB 14.2  --   --   --   --   --   --  14.0 13.5  HCT 38.9*  --   --   --   --   --   --  39.0* 36.8*  PLT 82*  --   --   --   --   --   --  80* 80*  APTT  --   --   --    < > 40*  --  91* 71* 71*  LABPROT 17.0*  --   --   --   --   --   --   --   --   INR 1.40  --   --   --   --   --   --   --   --   HEPARINUNFRC  --   --   --   --  2.24*  --  1.65*  --  0.61  CREATININE 0.84 0.81  --   --   --   --   --   --  0.71  TROPONINI 0.05* 0.66* 0.60*  --   --  0.54*  --   --   --    < > = values in this interval not displayed.    Estimated Creatinine Clearance: 103.1 mL/min (by C-G formula based on SCr of 0.71 mg/dL).   Assessment: Patient previously on apixaban 5 mg twice a day for a.fib with last dose recorded at 0700 today 06/05/18.  Heparin Course: Will bolus with 4000 units then will start heparin infusion at 1250 units/hr.  10/01 aPTT 91, heparin level 1.65. Continue current regimen. Recheck aPTT, heparin level, and CBC with tomorrow AM labs  10/1 1200 aPTT 71, therapeutic x2. Continue heparin drip at current rate. Will check aPTT, HL and CBC in AM. Hgb and plt count  stable.   Goal of Therapy:  Heparin level 0.3-0.7 units/ml aPTT 66-102 seconds Monitor platelets by anticoagulation protocol: Yes   Plan: 10/2 05:17 HL and aPTT remain therapeutic. CBC stable. Continue current rate. Pharmacy will continue to monitor daily per protocol. Since HL and aPTT now correlate we will proceed with monitoring via HL.   Laural Benes, Pharm.D., BCPS Clinical Pharmacist 06/07/2018,9:00 AM

## 2018-06-08 ENCOUNTER — Telehealth: Payer: Self-pay | Admitting: Cardiovascular Disease

## 2018-06-08 ENCOUNTER — Encounter: Payer: Self-pay | Admitting: Internal Medicine

## 2018-06-08 LAB — BASIC METABOLIC PANEL
ANION GAP: 6 (ref 5–15)
BUN: 9 mg/dL (ref 8–23)
CHLORIDE: 104 mmol/L (ref 98–111)
CO2: 29 mmol/L (ref 22–32)
Calcium: 10.1 mg/dL (ref 8.9–10.3)
Creatinine, Ser: 0.76 mg/dL (ref 0.61–1.24)
GFR calc non Af Amer: >60 mL/min (ref >60)
Glucose, Bld: 145 mg/dL — ABNORMAL HIGH (ref 70–99)
POTASSIUM: 4.4 mmol/L (ref 3.5–5.1)
SODIUM: 139 mmol/L (ref 135–145)

## 2018-06-08 LAB — CBC
HCT: 36.6 % — ABNORMAL LOW (ref 40.0–52.0)
HEMOGLOBIN: 13.1 g/dL (ref 13.0–18.0)
MCH: 31.9 pg (ref 26.0–34.0)
MCHC: 35.6 g/dL (ref 32.0–36.0)
MCV: 89.6 fL (ref 80.0–100.0)
Platelets: 75 10*3/uL — ABNORMAL LOW (ref 150–440)
RBC: 4.09 MIL/uL — AB (ref 4.40–5.90)
RDW: 16.9 % — ABNORMAL HIGH (ref 11.5–14.5)
WBC: 7.1 10*3/uL (ref 3.8–10.6)

## 2018-06-08 LAB — GLUCOSE, CAPILLARY: Glucose-Capillary: 123 mg/dL — ABNORMAL HIGH (ref 70–99)

## 2018-06-08 LAB — LIPID PANEL
CHOL/HDL RATIO: 3 ratio
Cholesterol: 69 mg/dL (ref 0–200)
HDL: 23 mg/dL — AB (ref >40)
LDL Cholesterol: 23 mg/dL (ref 0–99)
TRIGLYCERIDES: 113 mg/dL (ref <150)
VLDL: 23 mg/dL (ref 0–40)

## 2018-06-08 LAB — HEPARIN LEVEL (UNFRACTIONATED): Heparin Unfractionated: 0.4 IU/mL (ref 0.30–0.70)

## 2018-06-08 MED ORDER — CLOPIDOGREL BISULFATE 75 MG PO TABS: 75.0000 mg | ORAL_TABLET | Freq: Every day | ORAL | 0 refills | Status: DC

## 2018-06-08 MED ORDER — ENSURE ENLIVE PO LIQD: 237.0000 mL | ORAL | 0 refills | Status: DC

## 2018-06-08 MED ORDER — ATORVASTATIN CALCIUM 40 MG PO TABS: 40.0000 mg | ORAL_TABLET | Freq: Every day | ORAL | 0 refills | Status: DC

## 2018-06-08 MED ORDER — ADULT MULTIVITAMIN W/MINERALS CH: 1.0000 | ORAL_TABLET | Freq: Every day | ORAL | 0 refills | Status: AC

## 2018-06-08 MED ORDER — GLIMEPIRIDE 2 MG PO TABS: 2.0000 mg | ORAL_TABLET | Freq: Every day | ORAL | 0 refills | Status: DC

## 2018-06-08 MED ORDER — APIXABAN 5 MG PO TABS
5.0000 mg | ORAL_TABLET | Freq: Two times a day (BID) | ORAL | Status: DC
  Administered 2018-06-08: 5 mg via ORAL
  Filled 2018-06-08: qty 1

## 2018-06-08 NOTE — Telephone Encounter (Signed)
Patient irritated and only wants to see Dr Rockey Situ. He says they changed some of his medications when he left the hospital today and he doesn't trust all these doctors.  Advised him that it is ok to take the medications as they prescribed as the cardiology service is part of Dr Donivan Scull office. Patient was adamant that he wanted to see Dr Rockey Situ to find out what he wanted him to take. I offered to review all his medications with him and go over the list from the hospital while he looked at his medication bottles as well. Patient stated he had not gone to the pharmacy yet and was not at home and did not have his list or bottles with him. Was able to reschedule patient to see Dr Rockey Situ on 06/13/18. Patient agreeable to appointment change. Advised patient to take his medications as prescribed at discharge today and to call us if he wishes to go over them.  He verbalized understanding.

## 2018-06-08 NOTE — Discharge Summary (Signed)
[name] NAME: Cody Price    MR#:  [id number]  DATE OF BIRTH:  1941/01/31  DATE OF ADMISSION:  06/05/2018 ADMITTING PHYSICIAN: Fritzi Mandes, MD  DATE OF DISCHARGE: 06/08/2018 11:30 AM  PRIMARY CARE PHYSICIAN: Baxter Hire, MD    ADMISSION DIAGNOSIS:  Chest pain, unspecified type [R07.9]  DISCHARGE DIAGNOSIS:  Active Problems:   Chest pain   NSTEMI (non-ST elevated myocardial infarction) ([place])   SECONDARY DIAGNOSIS:   Past Medical History:  Diagnosis Date  . Arthritis    degenerative back   . BPH with obstruction/lower urinary tract symptoms   . CAD (coronary artery disease) 2010   stent  . Diabetes mellitus   . ED (erectile dysfunction)   . Elevated PSA   . GERD (gastroesophageal reflux disease)   . Gross hematuria   . H/O CT scan    recent renal CT due to hematuria, cystoscopy - normal    . Hematuria   . History of hiatal hernia   . History of kidney stones   . HLD (hyperlipidemia)   . Hypertension   . Ischemic heart disease 1991   with angioplasty  . Kidney stone   . Morbid obesity ([place])   . Myocardial infarction ([place])   . Peripheral neuropathy   . Pernicious anemia   . Pneumonia 1995   /w PE,   . Pulmonary embolism ([place])    previous  . Renal cyst   . Sleep apnea    pt. denies sleep apnea, pt. states he has had 2 studies - last one 2011  . Spinal stenosis     HOSPITAL COURSE:   1.   NSTEMI with history of coronary artery disease.  Patient had a cardiac catheterization and stent in the LAD.  Patient was placed on Plavix to keep the stent open.  Because of the patient's thrombocytopenia and high risk of bleeding his aspirin was discontinued but he did receive aspirin on admission and during the hospitalization.  Patient's Zocor was switched over to Lipitor high intensity statin.  Cardiology did not want to prescribe a beta-blocker because the patient was well controlled for chronic atrial fibrillation  with calcium channel blocker.  The patient was held on his Eliquis and placed on heparin drip during the hospital course and placed on Eliquis upon discharge. 2.  Chronic atrial fibrillation on Eliquis at home.  Eliquis was held and heparin drip was started while here.  Patient placed back on Eliquis upon going home.  Heart rate is well controlled with Cardizem CD. 3.  Hyperlipidemia unspecified.  LDL is 23.  Because of the  NSTEMI, I switched his Zocor over to Lipitor. 4.  Type 2 diabetes mellitus.  His sugars have been on the lower side and we held his glimepiride.  Can go back on a lower dose at home. 5.  GERD.  Patient on PPI and Carafate.  Patient states that he still going for procedure with Dr. Vira Agar next month.  I told him that cardiology would not like him to stop the Plavix after a stent for at least 6 months and this is not a good idea to have this procedure next month.  He says he still having the procedure. 6.  Reviewed note from Lars Pinks from [facility] in [place], that the patient does not trust the doctors in the hospital and wanted Dr. Rockey Situ to regulate his medications.  He did not mention any of this  to me when I discharged him today.   DISCHARGE CONDITIONS:   Satisfactory  CONSULTS OBTAINED:  Treatment Team:  Wellington Hampshire, MD  DRUG ALLERGIES:   Allergies  Allergen Reactions  . Amitriptyline   . Lyrica [Pregabalin]   . Metformin Nausea Only  . Neurontin [Gabapentin]   . Nitroglycerin Other (See Comments)    Makes Blood pressure go to low.   . Novocain [Procaine]   . Procaine Hcl Other (See Comments)    Passes out    DISCHARGE MEDICATIONS:   Allergies as of 06/08/2018      Reactions   Amitriptyline    Lyrica [pregabalin]    Metformin Nausea Only   Neurontin [gabapentin]    Nitroglycerin Other (See Comments)   Makes Blood pressure go to low.    Novocain [procaine]    Procaine Hcl Other (See Comments)   Passes out      Medication List     STOP taking these medications   hydrochlorothiazide 25 MG tablet Commonly known as:  HYDRODIURIL   simvastatin 40 MG tablet Commonly known as:  ZOCOR     TAKE these medications   acetaminophen 500 MG tablet Commonly known as:  TYLENOL Take 500 mg by mouth daily. Notes to patient:  None given today   apixaban 5 MG Tabs tablet Commonly known as:  ELIQUIS Take 1 tablet (5 mg total) by mouth 2 (two) times daily.   atorvastatin 40 MG tablet Commonly known as:  LIPITOR Take 1 tablet (40 mg total) by mouth at bedtime.   cholecalciferol 1000 units tablet Commonly known as:  VITAMIN D Take 1,000 Units by mouth 2 (two) times daily.   clopidogrel 75 MG tablet Commonly known as:  PLAVIX Take 1 tablet (75 mg total) by mouth daily with breakfast.   Co Q-10 200 MG Caps Take 200 mg by mouth daily. Notes to patient:  NONE GIVEN TODAY   diltiazem 180 MG 24 hr capsule Commonly known as:  CARDIZEM CD Take 180 mg by mouth daily before breakfast.   feeding supplement (ENSURE ENLIVE) Liqd Take 237 mLs by mouth daily. Notes to patient:  NONE GIVEN TODAY   glimepiride 2 MG tablet Commonly known as:  AMARYL Take 1 tablet (2 mg total) by mouth daily with breakfast. What changed:    medication strength  how much to take  when to take this   lisinopril 10 MG tablet Commonly known as:  PRINIVIL,ZESTRIL TAKE ONE TABLET BY MOUTH ONCE DAILY   magnesium oxide 400 MG tablet Commonly known as:  MAG-OX Take 400 mg by mouth daily.   multivitamin with minerals Tabs tablet Take 1 tablet by mouth daily.   omega-3 acid ethyl esters 1 g capsule Commonly known as:  LOVAZA Take 2 g by mouth daily.   omeprazole 20 MG capsule Commonly known as:  PRILOSEC Take 20 mg by mouth 2 (two) times daily.   oxyCODONE-acetaminophen 10-325 MG tablet Commonly known as:  PERCOCET Take 1 tablet by mouth every 4 (four) hours as needed for pain. Notes to patient:  DOSE GIVEN AT 0930   sucralfate 1 g  tablet Commonly known as:  CARAFATE 1 g 3 (three) times daily. Patient dissolves this in 1 tablespoon of warm water each time.   testosterone cypionate 200 MG/ML injection Commonly known as:  DEPOTESTOSTERONE CYPIONATE Inject 200 mg into the muscle every 28 (twenty-eight) days. Notes to patient:  RESUME REGULAR SCHEDULE   VALIUM 5 MG tablet Generic drug:  diazepam  Take 5 mg by mouth 3 (three) times daily as needed for anxiety or muscle spasms.   VIAGRA 100 MG tablet Generic drug:  sildenafil Take 100 mg by mouth daily as needed for erectile dysfunction.   VITAMIN B-12 IJ Inject 1 vial into the muscle every 30 (thirty) days. Notes to patient:  RESUME YOUR REGULAR SCHEDULE        DISCHARGE INSTRUCTIONS:   Follow-up with PMD 5 days Follow-up cardiology 1 week Follow-up with cardiac rehab  If you experience worsening of your admission symptoms, develop shortness of breath, life threatening emergency, suicidal or homicidal thoughts you must seek medical attention immediately by calling 911 or calling your MD immediately  if symptoms less severe.  You Must read complete instructions/literature along with all the possible adverse reactions/side effects for all the Medicines you take and that have been prescribed to you. Take any new Medicines after you have completely understood and accept all the possible adverse reactions/side effects.   Please note  You were cared for by a hospitalist during your hospital stay. If you have any questions about your discharge medications or the care you received while you were in the hospital after you are discharged, you can call the unit and asked to speak with the hospitalist on call if the hospitalist that took care of you is not available. Once you are discharged, your primary care physician will handle any further medical issues. Please note that NO REFILLS for any discharge medications will be authorized once you are discharged, as it is  imperative that you return to your primary care physician (or establish a relationship with a primary care physician if you do not have one) for your aftercare needs so that they can reassess your need for medications and monitor your lab values.    Today   CHIEF COMPLAINT:   Chief Complaint  Patient presents with  . Abdominal Pain  . Chest Pain    HISTORY OF PRESENT ILLNESS:  Cody Price  is a 77 y.o. male with a known history of CAD presents with chest pain.   VITAL SIGNS:  Blood pressure 125/67, pulse 75, temperature 97.9 F (36.6 C), temperature source Oral, resp. rate 18, height 6\' 1"  (1.854 m), weight 112.2 kg, SpO2 97 %.    PHYSICAL EXAMINATION:  GENERAL:  77 y.o.-year-old patient lying in the bed with no acute distress.  EYES: Pupils equal, round, reactive to light and accommodation. No scleral icterus. Extraocular muscles intact.  HEENT: Head atraumatic, normocephalic. Oropharynx and nasopharynx clear.  NECK:  Supple, no jugular venous distention. No thyroid enlargement, no tenderness.  LUNGS: Normal breath sounds bilaterally, no wheezing, rales,rhonchi or crepitation. No use of accessory muscles of respiration.  CARDIOVASCULAR: S1, S2 normal. No murmurs, rubs, or gallops.  ABDOMEN: Soft, non-tender, non-distended. Bowel sounds present. No organomegaly or mass.  EXTREMITIES: No pedal edema, cyanosis, or clubbing.  NEUROLOGIC: Cranial nerves II through XII are intact. Muscle strength 5/5 in all extremities. Sensation intact. Gait not checked.  PSYCHIATRIC: The patient is alert and oriented x 3.  SKIN: No obvious rash, lesion, or ulcer.   DATA REVIEW:   CBC Recent Labs  Lab 06/08/18 0518  WBC 7.1  HGB 13.1  HCT 36.6*  PLT 75*    Chemistries  Recent Labs  Lab 06/05/18 0928  06/08/18 0518  NA 137   < > 139  K 3.6   < > 4.4  CL 101   < > 104  CO2 26   < >  29  GLUCOSE 268*   < > 145*  BUN 10   < > 9  CREATININE 0.84   < > 0.76  CALCIUM 10.2   < > 10.1   AST 22  --   --   ALT 12  --   --   ALKPHOS 55  --   --   BILITOT 1.5*  --   --    < > = values in this interval not displayed.    Cardiac Enzymes Recent Labs  Lab 06/05/18 2117  TROPONINI 0.54*     Management plans discussed with the patient, and he is in agreement.  CODE STATUS:  Code Status History    Date Active Date Inactive Code Status Order ID Comments User Context   06/05/2018 1533 06/08/2018 1508 Full Code [id number]  Fritzi Mandes, MD ED   11/03/2015 1306 11/06/2015 2018 Full Code [id number]  Demetrios Loll, MD Inpatient    Advance Directive Documentation     Most Recent Value  Type of Advance Directive  Healthcare Power of Attorney  Pre-existing out of facility DNR order (yellow form or pink MOST form)  -  "MOST" Form in Place?  -      TOTAL TIME TAKING CARE OF THIS PATIENT: 35 minutes.    Loletha Grayer M.D on 06/08/2018 at 5:01 PM  Between 7am to 6pm - Pager - [phone]  After 6pm go to [email or web address] - password [email or web address] [facility]  [phone]  CC: Primary care physician; Baxter Hire, MD

## 2018-06-08 NOTE — Progress Notes (Signed)
Progress Note  Patient Name: Cody Price Date of Encounter: 06/08/2018  Primary Cardiologist: Ida Rogue, MD   Subjective   No chest pain or shortness of breath.  He had LAD stent done yesterday with chronic occlusion of the RCA.  Inpatient Medications    Scheduled Meds: . apixaban  5 mg Oral BID  . atorvastatin  40 mg Oral QHS  . cholecalciferol  1,000 Units Oral BID  . clopidogrel  75 mg Oral Q breakfast  . diltiazem  180 mg Oral QAC breakfast  . feeding supplement (ENSURE ENLIVE)  237 mL Oral Q24H  . Influenza vac split quadrivalent PF  0.5 mL Intramuscular Tomorrow-1000  . insulin aspart  0-5 Units Subcutaneous QHS  . insulin aspart  0-9 Units Subcutaneous TID WC  . lisinopril  10 mg Oral Daily  . magnesium oxide  400 mg Oral Daily  . multivitamin with minerals  1 tablet Oral Daily  . omega-3 acid ethyl esters  2 g Oral Daily  . pantoprazole  40 mg Oral Daily  . sodium chloride flush  3 mL Intravenous Q12H  . sucralfate  1 g Oral TID WC & HS   Continuous Infusions: . sodium chloride     PRN Meds: sodium chloride, acetaminophen, diazepam, morphine injection, ondansetron (ZOFRAN) IV, oxyCODONE-acetaminophen **AND** oxyCODONE, sodium chloride flush   Vital Signs    Vitals:   06/07/18 1633 06/07/18 1936 06/08/18 0505 06/08/18 0716  BP: 124/65 118/90 126/70 125/67  Pulse: 60 68 71 75  Resp: 18  16 18   Temp: 97.7 F (36.5 C) 98 F (36.7 C) 98.2 F (36.8 C) 97.9 F (36.6 C)  TempSrc: Oral Oral Oral Oral  SpO2: 100% 95% 96% 97%  Weight:      Height:        Intake/Output Summary (Last 24 hours) at 06/08/2018 0906 Last data filed at 06/08/2018 0753 Gross per 24 hour  Intake 318.23 ml  Output 1075 ml  Net -756.77 ml   Filed Weights   06/05/18 0927 06/05/18 1844 06/07/18 0917  Weight: 112 kg 112.2 kg 112.2 kg    Telemetry    Atrial fibrillation with ventricular rate in the 70s.- Personally Reviewed  ECG    Atrial fibrillation with old septal  infarct no significant ST or T wave changes.- Personally Reviewed  Physical Exam   GEN: No acute distress.   Neck: No JVD Cardiac: RRR, no murmurs, rubs, or gallops.  Respiratory: Clear to auscultation bilaterally. GI: Soft, nontender, non-distended  MS: No edema; No deformity. Neuro:  Nonfocal  Psych: Normal affect  Right radial pulses normal with no hematoma.  Labs    Chemistry Recent Labs  Lab 06/05/18 0928 06/05/18 1546 06/07/18 0517 06/08/18 0518  NA 137  --  139 139  K 3.6  --  3.5 4.4  CL 101  --  103 104  CO2 26  --  28 29  GLUCOSE 268*  --  120* 145*  BUN 10  --  13 9  CREATININE 0.84 0.81 0.71 0.76  CALCIUM 10.2  --  10.0 10.1  PROT 6.6  --   --   --   ALBUMIN 4.4  --   --   --   AST 22  --   --   --   ALT 12  --   --   --   ALKPHOS 55  --   --   --   BILITOT 1.5*  --   --   --  GFRNONAA >60 >60 >60 >60  GFRAA >60 >60 >60 >60  ANIONGAP 10  --  8 6     Hematology Recent Labs  Lab 06/06/18 1157 06/07/18 0517 06/08/18 0518  WBC 7.7 7.2 7.1  RBC 4.37* 4.17* 4.09*  HGB 14.0 13.5 13.1  HCT 39.0* 36.8* 36.6*  MCV 89.3 88.3 89.6  MCH 32.0 32.3 31.9  MCHC 35.8 36.6* 35.6  RDW 16.0* 16.8* 16.9*  PLT 80* 80* 75*    Cardiac Enzymes Recent Labs  Lab 06/05/18 0928 06/05/18 1546 06/05/18 1831 06/05/18 2117  TROPONINI 0.05* 0.66* 0.60* 0.54*   No results for input(s): TROPIPOC in the last 168 hours.   BNP Recent Labs  Lab 06/05/18 0928  BNP 235.0*     DDimer No results for input(s): DDIMER in the last 168 hours.   Radiology    No results found.  Cardiac Studies   Cardiac cath yesterday:  Conclusions: 1. Severe 2-vessel coronary artery disease, including 90% mid LAD stenosis involving small D1 branch just beyond old stent and chronic total occlusion of the distal RCA.  Severe apical LAD disease is stable from prior catheterization. 2. Moderate, non-obstructive disease involving the LCx. 3. Patent proximal LAD stent with mild in-stent  restenosis. 4. Mildly elevated left ventricular filling pressure. 5. Successful PCI to mid LAD using Resolute Onyx 2.5 x 18 mm drug-eluting stent with 0% residual stenosis and TIMI-3 flow.  Recommendations: 1. Medical therapy for chronic total occlusion of RCA and small diagonal branch. 2. Aggressive secondary prevention. 3. Restart heparin infusion 2 hours after deflation of TR band.  Recommend to resume Apixaban, at currently prescribed dose and frequency, on 06/08/18 if no evidence of bleeding or vascular injury.  Recommend concurrent antiplatelet therapy of Clopidogrel 75mg  daily for 12 months.   Nelva Bush, MD [place]  Patient Profile     77 [facility] a hx ofCAD s/p PCI (1991PTCAto RCA later occluded 100%, 2010DES toLAD ,2015 cathwith recommendation for plavix), paroxysmal Afib, HTN, HLD, DM2 with lower extremity neuropathy, spinal stenosis s/p surgery,GERD, and prior smokerwhowas admitted 9/30 with a 3 wk h/o constant chest pain with worsening abd and back pain, and was found to have an elevated troponin.  Assessment & Plan    1.  Non-ST elevation myocardial infarction: Status post successful PCI and drug-eluting stent placement to the LAD.  Recommend Plavix 75 mg once daily without aspirin given that he is on long-term anticoagulation with Eliquis.  Recommend outpatient cardiac rehab.  2.  Chronic atrial fibrillation: Given that ventricular rate has been well controlled on diltiazem.  We elected not to switch to a beta-blocker.  Stop heparin and resume Eliquis.  3.  Essential hypertension: Blood pressure is well controlled on current medications.  The patient can be discharged home from a cardiac standpoint.  We will arrange for follow-up in 1 to 2 weeks.  I discussed the case with Dr. Leslye Peer.  For questions or updates, please contact [place] Please consult [email or web address] for contact info under        Signed, Kathlyn Sacramento, MD    06/08/2018, 9:06 AM

## 2018-06-08 NOTE — Telephone Encounter (Signed)
Pt c/o medication issue:  1. Name of Medication: HCTZ and Cholesterol med   2. How are you currently taking this medication (dosage and times per day)? Please advise   3. Are you having a reaction (difficulty breathing--STAT)? No   4. What is your medication issue? Patient recent admission and wants to know why hctz was stopped and to clarify changing from simvastatin to atorvastatin and if taking it is necessary

## 2018-06-08 NOTE — Consult Note (Addendum)
ANTICOAGULATION CONSULT NOTE -  Follow up [facility] for heparin dosing. Indication: chest pain/ACS  Allergies  Allergen Reactions  . Amitriptyline   . Lyrica [Pregabalin]   . Metformin Nausea Only  . Neurontin [Gabapentin]   . Nitroglycerin Other (See Comments)    Makes Blood pressure go to low.   . Novocain [Procaine]   . Procaine Hcl Other (See Comments)    Passes out    Patient Measurements: Height: 6\' 1"  (185.4 cm) Weight: 247 lb 5.7 oz (112.2 kg) IBW/kg (Calculated) : 79.9 Heparin Dosing Weight: 103.5  Vital Signs: Temp: 98 F (36.7 C) (10/02 1936) Temp Source: Oral (10/02 1936) BP: 118/90 (10/02 1936) Pulse Rate: 68 (10/02 1936)  Labs: Recent Labs    06/05/18 0928 06/05/18 1546 06/05/18 1831  06/05/18 2117 06/06/18 0345 06/06/18 1157 06/07/18 0517 06/07/18 2318  HGB 14.2  --   --   --   --   --  14.0 13.5  --   HCT 38.9*  --   --   --   --   --  39.0* 36.8*  --   PLT 82*  --   --   --   --   --  80* 80*  --   APTT  --   --   --    < >  --  91* 71* 71*  --   LABPROT 17.0*  --   --   --   --   --   --   --   --   INR 1.40  --   --   --   --   --   --   --   --   HEPARINUNFRC  --   --   --    < >  --  1.65*  --  0.61 0.42  CREATININE 0.84 0.81  --   --   --   --   --  0.71  --   TROPONINI 0.05* 0.66* 0.60*  --  0.54*  --   --   --   --    < > = values in this interval not displayed.    Estimated Creatinine Clearance: 103.1 mL/min (by C-G formula based on SCr of 0.71 mg/dL).   Assessment: Patient previously on apixaban 5 mg twice a day for a.fib with last dose recorded at 0700 today 06/05/18.  Heparin Course: Will bolus with 4000 units then will start heparin infusion at 1250 units/hr.  10/01 aPTT 91, heparin level 1.65. Continue current regimen. Recheck aPTT, heparin level, and CBC with tomorrow AM labs  10/1 1200 aPTT 71, therapeutic x2. Continue heparin drip at current rate. Will check aPTT, HL and CBC in AM. Hgb and plt count  stable.   Goal of Therapy:  Heparin level 0.3-0.7 units/ml aPTT 66-102 seconds Monitor platelets by anticoagulation protocol: Yes   Plan: Heparin infusion was held this morning during cardiac catheterization. Pharmacy consulted post cath to resume heparin infusion two hours post TR band deflation. TR band deflated to 0 mL at 13:15 per post procedure assessment documentation. Will restart heparin infusion at 15:15 at previous therapeutic rate of 12.5 mL/hr. Check heparin level 8 hours after resuming heparin infusion.  10/02 2300 heparin level 0.42. Continue current regimen. Recheck heparin level and CBC with tomorrow AM labs.  10/03 AM heparin level 0.40. Continue current regimen. Recheck heparin level and CBC with tomorrow AM labs.  Sim Boast, PharmD, BCPS  06/08/18 12:53 AM

## 2018-06-08 NOTE — Progress Notes (Signed)
Discharged to home with son.  Radial Site is clean and dry.  No swelling, no bleeding.  Prescriptions and changes in medications explained to me.  Follow up appointments made.

## 2018-06-12 DIAGNOSIS — I25118 Atherosclerotic heart disease of native coronary artery with other forms of angina pectoris: Secondary | ICD-10-CM | POA: Insufficient documentation

## 2018-06-12 NOTE — Progress Notes (Signed)
Cardiology Office Note  Date:  06/13/2018   ID:  Cody Price, DOB 21-Feb-1941, MRN [id number]  PCP:  Baxter Hire, MD   Chief Complaint  Patient presents with  . OTHER    Post cardiac cath/[facility] c/o chest discomfort and discuss medications pt is mentioned he isn't changing any of his meds until His cardiologist has made changes! Meds reviewed verbally with pt.    HPI:  Cody Price is a 77 year old gentleman with a history of  coronary artery disease,  PTCA in 1991 of his RCA that later occluded 100%,  chest pain in September 2010   stent placed to his LAD Stable since that time atrial fibrillation following back surgery  spontaneously converted , previously declined anticoagulation Diabetes, lower extremity neuropathy. s/p back surgery for lumbar spinal stenosis and DJD  unable to tolerate aspirin, GI problems 20 years ago,  stopped the metoprolol on his own as he reported having fatigue.  seen by Dr. Grayland Ormond for low platelets, treated with prednisone Quit smoking in 1987, smoked 20 years Chronically low platelets, avg 60s to 70s presenting for routine follow-up of his coronary artery disease.  Recent hospitalization with discharge 4 days ago non-STEMI 06/07/2018 Hospital records reviewed with the patient in detail Cardiac catheterization, stent to the LAD Aspirin held given thrombocytopenia Not on beta-blocker as he has adequate rate control on calcium channel blocker for chronic atrial fibrillation Restarted on Eliquis  Cardiac catheterization June 07, 2018 1. Severe 2-vessel coronary artery disease, including 90% mid LAD stenosis involving small D1 branch just beyond old stent and chronic total occlusion of the distal RCA.  Severe apical LAD disease is stable from prior catheterization. 2. Moderate, non-obstructive disease involving the LCx. 3. Patent proximal LAD stent with mild in-stent restenosis. 4. Mildly elevated left ventricular filling pressure. 5. Successful  PCI to mid LAD using Resolute Onyx 2.5 x 18 mm drug-eluting stent with 0% residual stenosis and TIMI-3 flow.  Recommendations: 1. Medical therapy for chronic total occlusion of RCA and small diagonal branch.  In follow-up today denies any significant chest pain Ambulating without problems Concerned about hospital changing his medications He was discharged on Lipitor but preferred to go back on simvastatin Diabetes pills were cut in half down to 2 was previously taking four Feels the HCTZ is enough for mildly elevated pressures seen on echocardiogram Does not think he needs Lasix Denies any leg swelling  Main complaint is abdominal fullness Reports he is seen Dr. Vira Agar Potentially was being scheduled for colonoscopy EGD next month November but now has a new stent and is on Plavix  Hemoglobin A1c ranging between 6 and 7  permanent Atrial fib since 2017. Previously declined anticoagulation Now Tolerating eliquis  chronic back pain,  Has been seen at [facility], neurosurgery  Walking with a cane  EKG personally reviewed by myself on todays visit Shows atrial fibrillation with rate 95 bpm no significant ST or T wave changes  Other past medical history reviewed MRI of his back November 2017 Taking pain medication as well as Tylenol  He presented to the emergency room August 25 2014 with chest pain. Cardiac enzymes were negative Recovered well from his cardiac catheterization 08/26/2014. Cardiac catheterization was performed for chest pain. This showed an occluded proximal RCA which was chronic, mild to moderate proximal left circumflex, moderate distal left circumflex disease of a small vessel, moderate proximal OM disease moderate size vessel, LAD proximal stent patent, normal ejection fraction. Medical management was recommended.  Since the  cardiac catheterization, he denies any further episodes of chest pain. He does have significant fatigue. He is active, Limited in his  activities by his back pain but denies any significant shortness of breath or chest pain with exertion.  Minimal lower extremity edema He denies any significant nausea, chest tightness or heaviness. This was his previous anginal equivalent.  lab work 08/26/2014 showing total cholesterol 94, LDL 33, HDL 23  Cardiac catheterization September 2000 and details a 75% proximal LAD lesion, 100% RCA lesion, 30% left circumflex lesion a xience 2.75 x 15 mm DES stent was placed.  PMH:   has a past medical history of Arthritis, BPH with obstruction/lower urinary tract symptoms, CAD (coronary artery disease) (2010), Diabetes mellitus, ED (erectile dysfunction), Elevated PSA, GERD (gastroesophageal reflux disease), Gross hematuria, H/O CT scan, Hematuria, History of hiatal hernia, History of kidney stones, HLD (hyperlipidemia), Hypertension, Ischemic heart disease (1991), Kidney stone, Morbid obesity ([place]), Myocardial infarction [facility]), Peripheral neuropathy, Pernicious anemia, Pneumonia (1995), Pulmonary embolism ([place]), Renal cyst, Sleep apnea, and Spinal stenosis.  PSH:    Past Surgical History:  Procedure Laterality Date  . APPENDECTOMY    . BACK SURGERY     x3 back surgeries - /w fusioin, 1- [place]  . bilateral inguinal hernia repair    . cad     stent 2010  . CARDIAC CATHETERIZATION  08/17/2014  . CARPAL TUNNEL RELEASE  2014   bilateral   . CHOLECYSTECTOMY    . CORONARY STENT INTERVENTION N/A 06/07/2018   Procedure: CORONARY STENT INTERVENTION;  Surgeon: Nelva Bush, MD;  Location: [facility];  Service: Cardiovascular;  Laterality: N/A;  . LAMINECTOMY    . LEFT HEART CATH AND CORONARY ANGIOGRAPHY N/A 06/07/2018   Procedure: LEFT HEART CATH AND CORONARY ANGIOGRAPHY;  Surgeon: Nelva Bush, MD;  Location: [facility];  Service: Cardiovascular;  Laterality: N/A;    Current Outpatient Medications  Medication Sig Dispense Refill  . acetaminophen (TYLENOL)  500 MG tablet Take 500 mg by mouth as needed.     Marland Kitchen apixaban (ELIQUIS) 5 MG TABS tablet Take 1 tablet (5 mg total) by mouth 2 (two) times daily. 60 tablet 6  . clopidogrel (PLAVIX) 75 MG tablet Take 1 tablet (75 mg total) by mouth daily with breakfast. 30 tablet 0  . Coenzyme Q10 (CO Q-10) 200 MG CAPS Take 200 mg by mouth daily.    . Cyanocobalamin (VITAMIN B-12 IJ) Inject 1 vial into the muscle every 30 (thirty) days.     . diazepam (VALIUM) 5 MG tablet Take 5 mg by mouth 3 (three) times daily as needed for anxiety or muscle spasms.     Marland Kitchen diltiazem (CARDIZEM CD) 180 MG 24 hr capsule Take 180 mg by mouth daily before breakfast.     . feeding supplement, ENSURE ENLIVE, (ENSURE ENLIVE) LIQD Take 237 mLs by mouth daily. 60 Bottle 0  . glimepiride (AMARYL) 4 MG tablet Take 4 mg by mouth 2 (two) times daily.    . hydrochlorothiazide (HYDRODIURIL) 25 MG tablet Take 25 mg by mouth daily.    Marland Kitchen lisinopril (PRINIVIL,ZESTRIL) 10 MG tablet TAKE ONE TABLET BY MOUTH ONCE DAILY (Patient taking differently: Take 10 mg by mouth daily. ) 90 tablet 2  . magnesium oxide (MAG-OX) 400 MG tablet Take 400 mg by mouth daily.    . Multiple Vitamin (MULTIVITAMIN WITH MINERALS) TABS tablet Take 1 tablet by mouth daily. 30 tablet 0  . omega-3 acid ethyl esters (LOVAZA) 1  g capsule Take 2 g by mouth daily.    Marland Kitchen omeprazole (PRILOSEC) 20 MG capsule Take 20 mg by mouth 2 (two) times daily.     Marland Kitchen oxyCODONE-acetaminophen (PERCOCET) 10-325 MG tablet Take 1 tablet by mouth every 4 (four) hours as needed for pain.     . sildenafil (VIAGRA) 100 MG tablet Take 100 mg by mouth daily as needed for erectile dysfunction.     . simvastatin (ZOCOR) 40 MG tablet Take 40 mg by mouth daily.    . sucralfate (CARAFATE) 1 g tablet 1 g 3 (three) times daily. Patient dissolves this in 1 tablespoon of warm water each time.  0  . testosterone cypionate (DEPOTESTOSTERONE CYPIONATE) 200 MG/ML injection Inject 200 mg into the muscle every 28  (twenty-eight) days.      No current facility-administered medications for this visit.      Allergies:   Amitriptyline; Lyrica [pregabalin]; Metformin; Neurontin [gabapentin]; Nitroglycerin; Novocain [procaine]; and Procaine hcl   Social History:  The patient  reports that he quit smoking about 32 years ago. He has a 45.00 pack-year smoking history. He has quit using smokeless tobacco. He reports that he drinks alcohol. He reports that he does not use drugs.   Family History:   family history includes Heart attack in his brother; Heart disease in his father; Heart failure in his mother; Skin cancer in his sister.    Review of Systems: Review of Systems  Constitutional: Negative.        Weakness  Respiratory: Negative.   Cardiovascular: Negative.   Gastrointestinal: Negative.   Musculoskeletal: Positive for back pain.  Neurological: Negative.        Neuropathy lower extremities  Psychiatric/Behavioral: Negative.   All other systems reviewed and are negative.   PHYSICAL EXAM: VS:  BP 128/64 (BP Location: Left Arm, Patient Position: Sitting, Cuff Size: Normal)   Pulse 95   Ht 6\' 1"  (1.854 m)   Wt 241 lb 8 oz (109.5 kg)   BMI 31.86 kg/m  , BMI Body mass index is 31.86 kg/m.  No significant change in exam Constitutional:  oriented to person, place, and time. No distress. Obese HENT:  Head: Normocephalic and atraumatic.  Eyes:  no discharge. No scleral icterus.  Neck: Normal range of motion. Neck supple. No JVD present.  Cardiovascular: irregularly irregular, normal heart sounds and intact distal pulses. Exam reveals no gallop and no friction rub. No edema No murmur heard. Pulmonary/Chest: Effort normal and breath sounds normal. No stridor. No respiratory distress.  no wheezes.  no rales.  no tenderness.  Abdominal: Soft.  no distension.  no tenderness.  Musculoskeletal: Normal range of motion.  no  tenderness or deformity.  Neurological: Grossly nonfocal Skin: Skin is warm  and dry. No rash noted. not diaphoretic.  Psychiatric:  normal mood and affect. behavior is normal. Thought content normal.    Recent Labs: 06/05/2018: ALT 12; B Natriuretic Peptide 235.0 06/08/2018: BUN 9; Creatinine, Ser 0.76; Hemoglobin 13.1; Platelets 75; Potassium 4.4; Sodium 139    Lipid Panel Lab Results  Component Value Date   CHOL 69 06/08/2018   HDL 23 (L) 06/08/2018   LDLCALC 23 06/08/2018   TRIG 113 06/08/2018      Wt Readings from Last 3 Encounters:  06/13/18 241 lb 8 oz (109.5 kg)  06/07/18 247 lb 5.7 oz (112.2 kg)  06/01/18 247 lb (112 kg)       ASSESSMENT AND PLAN:  Atrial fibrillation, unspecified type ([place]) - Plan: EKG  12-Lead Rate well controlled, tolerating anticoagulation On Eliquis  Mixed hyperlipidemia Prefers to take simvastatin as he knows that he tolerates this Numbers at goal regardless  Type 2 diabetes mellitus with other circulatory complication, without long-term current use of insulin ([facility])  Recommended low carbohydrate diet  weight down 6 pounds she attributes to GI problem  Thrombocytopenia ([place]) previously seen by hematology at [facility]. Numbers around 60-70.  Stable  Atherosclerosis of native coronary artery of native heart without angina pectoris Stent to his LAD on recent hospitalization last week Recommended he not stop the Plavix for at least 6 months if not a year  Neuropathy Management per primary care Stable. Chronic pain   Total encounter time more than 25 minutes  Greater than 50% was spent in counseling and coordination of care with the patient   Disposition:   F/U 6 months   Orders Placed This Encounter  Procedures  . EKG 12-Lead     Signed, Esmond Plants, M.D., Ph.D. 06/13/2018  [place], [place]

## 2018-06-13 ENCOUNTER — Encounter: Payer: Self-pay | Admitting: Cardiovascular Disease

## 2018-06-13 ENCOUNTER — Ambulatory Visit: Payer: Medicare HMO | Admitting: Cardiovascular Disease

## 2018-06-13 ENCOUNTER — Telehealth: Payer: Self-pay

## 2018-06-13 VITALS — BP 128/64 | HR 95 | Ht 73.0 in | Wt 241.5 lb

## 2018-06-13 DIAGNOSIS — I480 Paroxysmal atrial fibrillation: Secondary | ICD-10-CM | POA: Diagnosis not present

## 2018-06-13 DIAGNOSIS — E1159 Type 2 diabetes mellitus with other circulatory complications: Secondary | ICD-10-CM | POA: Diagnosis not present

## 2018-06-13 DIAGNOSIS — I25118 Atherosclerotic heart disease of native coronary artery with other forms of angina pectoris: Secondary | ICD-10-CM

## 2018-06-13 DIAGNOSIS — E782 Mixed hyperlipidemia: Secondary | ICD-10-CM | POA: Diagnosis not present

## 2018-06-13 DIAGNOSIS — R531 Weakness: Secondary | ICD-10-CM

## 2018-06-13 NOTE — Patient Instructions (Addendum)
Medication Instructions:   Stay on plavix/clopidogrel That keeps the stent open Do not stop  Labwork:  No new labs needed  Testing/Procedures:  No further testing at this time   Follow-Up: It was a pleasure seeing you in the office today. Please call us if you have new issues that need to be addressed before your next appt.  [phone]  Your physician wants you to follow-up in: 6 months.  You will receive a reminder letter in the mail two months in advance. If you don't receive a letter, please call our office to schedule the follow-up appointment.  If you need a refill on your cardiac medications before your next appointment, please call your pharmacy.  For educational health videos Log in to : [email or web address] Or : [email or web address], password : [email or web address]

## 2018-06-13 NOTE — Telephone Encounter (Signed)
EMMI Follow-up: Mrs. Bochicchio left a voice message that they had received several automated calls from my number and checking to see why [place] had called.  I explained our process of 2 automated calls post discharge and we where just checking to see how Cody Price was doing and to see if he had any questions.  Mr. Chavous has some questions about medication changes but will talk with Dr. Candis Musa about this today. Thanked me for checking on her husband.  No other needs noted.

## 2018-06-14 ENCOUNTER — Telehealth: Payer: Self-pay | Admitting: Cardiovascular Disease

## 2018-06-14 ENCOUNTER — Other Ambulatory Visit: Payer: Self-pay

## 2018-06-14 ENCOUNTER — Encounter: Payer: Self-pay | Admitting: Emergency Medicine

## 2018-06-14 ENCOUNTER — Emergency Department
Admission: EM | Admit: 2018-06-14 | Discharge: 2018-06-14 | Disposition: A | Discharge status: Home / Self Care | Payer: Medicare HMO | Attending: Emergency Medicine | Admitting: Emergency Medicine

## 2018-06-14 DIAGNOSIS — Z79899 Other long term (current) drug therapy: Secondary | ICD-10-CM | POA: Insufficient documentation

## 2018-06-14 DIAGNOSIS — I10 Essential (primary) hypertension: Secondary | ICD-10-CM | POA: Diagnosis not present

## 2018-06-14 DIAGNOSIS — Z7984 Long term (current) use of oral hypoglycemic drugs: Secondary | ICD-10-CM | POA: Diagnosis not present

## 2018-06-14 DIAGNOSIS — I2510 Atherosclerotic heart disease of native coronary artery without angina pectoris: Secondary | ICD-10-CM | POA: Insufficient documentation

## 2018-06-14 DIAGNOSIS — I252 Old myocardial infarction: Secondary | ICD-10-CM | POA: Diagnosis not present

## 2018-06-14 DIAGNOSIS — Z87891 Personal history of nicotine dependence: Secondary | ICD-10-CM | POA: Diagnosis not present

## 2018-06-14 DIAGNOSIS — E1140 Type 2 diabetes mellitus with diabetic neuropathy, unspecified: Secondary | ICD-10-CM | POA: Diagnosis not present

## 2018-06-14 DIAGNOSIS — Z7902 Long term (current) use of antithrombotics/antiplatelets: Secondary | ICD-10-CM | POA: Insufficient documentation

## 2018-06-14 DIAGNOSIS — R319 Hematuria, unspecified: Secondary | ICD-10-CM

## 2018-06-14 LAB — COMPREHENSIVE METABOLIC PANEL
ALK PHOS: 55 U/L (ref 38–126)
ALT: 18 U/L (ref 0–44)
AST: 15 U/L (ref 15–41)
Albumin: 4.8 g/dL (ref 3.5–5.0)
Anion gap: 10 (ref 5–15)
BUN: 20 mg/dL (ref 8–23)
CALCIUM: 10.5 mg/dL — AB (ref 8.9–10.3)
CHLORIDE: 101 mmol/L (ref 98–111)
CO2: 24 mmol/L (ref 22–32)
CREATININE: 0.96 mg/dL (ref 0.61–1.24)
Glucose, Bld: 183 mg/dL — ABNORMAL HIGH (ref 70–99)
Potassium: 4.1 mmol/L (ref 3.5–5.1)
Sodium: 135 mmol/L (ref 135–145)
Total Bilirubin: 2.3 mg/dL — ABNORMAL HIGH (ref 0.3–1.2)
Total Protein: 7.3 g/dL (ref 6.5–8.1)

## 2018-06-14 LAB — CBC WITH DIFFERENTIAL/PLATELET
ABS IMMATURE GRANULOCYTES: 0.06 10*3/uL (ref 0.00–0.07)
BASOS PCT: 0 %
Basophils Absolute: 0 10*3/uL (ref 0.0–0.1)
EOS PCT: 1 %
Eosinophils Absolute: 0.1 10*3/uL (ref 0.0–0.5)
HCT: 40.9 % (ref 39.0–52.0)
Hemoglobin: 14.6 g/dL (ref 13.0–17.0)
Immature Granulocytes: 1 %
Lymphocytes Relative: 23 %
Lymphs Abs: 2 10*3/uL (ref 0.7–4.0)
MCH: 31.5 pg (ref 26.0–34.0)
MCHC: 35.7 g/dL (ref 30.0–36.0)
MCV: 88.1 fL (ref 80.0–100.0)
MONO ABS: 2.8 10*3/uL — AB (ref 0.1–1.0)
Monocytes Relative: 33 %
NEUTROS ABS: 3.7 10*3/uL (ref 1.7–7.7)
Neutrophils Relative %: 42 %
PLATELETS: 79 10*3/uL — AB (ref 150–400)
RBC: 4.64 MIL/uL (ref 4.22–5.81)
RDW: 16.1 % — ABNORMAL HIGH (ref 11.5–15.5)
WBC: 8.7 10*3/uL (ref 4.0–10.5)
nRBC: 0 % (ref 0.0–0.2)

## 2018-06-14 LAB — URINALYSIS, COMPLETE (UACMP) WITH MICROSCOPIC
BILIRUBIN URINE: NEGATIVE
Bacteria, UA: NONE SEEN
GLUCOSE, UA: 50 mg/dL — AB
Ketones, ur: NEGATIVE mg/dL
LEUKOCYTES UA: NEGATIVE
NITRITE: NEGATIVE
PROTEIN: 30 mg/dL — AB
Specific Gravity, Urine: 1.023 (ref 1.005–1.030)
pH: 6 (ref 5.0–8.0)

## 2018-06-14 NOTE — ED Triage Notes (Addendum)
Pt arrived with complaints of blood in his urine today. Pt started plavix yesterday. Pt denies any acute pain

## 2018-06-14 NOTE — ED Provider Notes (Signed)
[facility] Emergency Department Provider Note  ____________________________________________   I have reviewed the triage vital signs and the nursing notes. Where available I have reviewed prior notes and, if possible and indicated, outside hospital notes.    HISTORY  Chief Complaint Hematuria    HPI Cody Price is a 77 y.o. male with a history of multiple to her medical problems including trouble cytopenia, paroxysmal atrial fibrillation on Eliquis, who had recent stents placed about a week ago, has been placed again on Plavix yesterday and had a faint blush of blood in his urine today.  No frank hematuria no nausea no vomiting no obstructive symptoms, no lightheadedness, does not feel otherwise unwell.  He did see his cardiologist yesterday who had restarted his Plavix.    Past Medical History:  Diagnosis Date  . Arthritis    degenerative back   . BPH with obstruction/lower urinary tract symptoms   . CAD (coronary artery disease) 2010   stent  . Diabetes mellitus   . ED (erectile dysfunction)   . Elevated PSA   . GERD (gastroesophageal reflux disease)   . Gross hematuria   . H/O CT scan    recent renal CT due to hematuria, cystoscopy - normal    . Hematuria   . History of hiatal hernia   . History of kidney stones   . HLD (hyperlipidemia)   . Hypertension   . Ischemic heart disease 1991   with angioplasty  . Kidney stone   . Morbid obesity ([place])   . Myocardial infarction ([place])   . Peripheral neuropathy   . Pernicious anemia   . Pneumonia 1995   /w PE,   . Pulmonary embolism ([place])    previous  . Renal cyst   . Sleep apnea    pt. denies sleep apnea, pt. states he has had 2 studies - last one 2011  . Spinal stenosis     Patient Active Problem List   Diagnosis Date Noted  . Atherosclerosis of native coronary artery of native heart with stable angina pectoris ([place]) 06/12/2018  . Chest pain 06/05/2018  . NSTEMI (non-ST elevated  myocardial infarction) ([place])   . Weakness 02/13/2018  . Chronic postoperative pain 04/13/2017  . Lumbar post-laminectomy syndrome 04/13/2017  . Antiplatelet or antithrombotic long-term use 04/11/2017  . Neuropathy 09/21/2016  . Chronic, continuous use of opioids 05/27/2016  . Erectile dysfunction 01/01/2016  . BPH with obstruction/lower urinary tract symptoms 01/01/2016  . Pneumonia 11/03/2015  . Nocturia 10/02/2015  . Erectile dysfunction of organic origin 10/02/2015  . Angina pectoris ([place]) 09/23/2015  . Thrombocytopenia ([place]) 09/23/2015  . B12 deficiency 05/26/2015  . Chronic idiopathic thrombocytopenia ([place]) 02/10/2015  . Anemia 05/08/2014  . Diabetes mellitus type 2, uncomplicated ([place]) 91/63/8466  . DDD (degenerative disc disease) 05/08/2014  . Elevated PSA 05/08/2014  . H/O ulcer disease 05/08/2014  . History of kidney stones 05/08/2014  . Hx of pulmonary embolus 05/08/2014  . Hypertension 05/08/2014  . Kidney stones 05/08/2014  . Obesity 05/08/2014  . Spinal stenosis 05/08/2014  . Chronic back pain 06/21/2013  . Diabetes mellitus ([place]) 06/20/2012  . Hyperlipidemia 05/28/2010  . Coronary atherosclerosis 05/28/2010  . ATRIAL FIBRILLATION 05/28/2010    Past Surgical History:  Procedure Laterality Date  . APPENDECTOMY    . BACK SURGERY     x3 back surgeries - /w fusioin, 1- [place]  . bilateral inguinal hernia repair    . cad  stent 2010  . CARDIAC CATHETERIZATION  08/17/2014  . CARPAL TUNNEL RELEASE  2014   bilateral   . CHOLECYSTECTOMY    . CORONARY STENT INTERVENTION N/A 06/07/2018   Procedure: CORONARY STENT INTERVENTION;  Surgeon: Nelva Bush, MD;  Location: [facility];  Service: Cardiovascular;  Laterality: N/A;  . LAMINECTOMY    . LEFT HEART CATH AND CORONARY ANGIOGRAPHY N/A 06/07/2018   Procedure: LEFT HEART CATH AND CORONARY ANGIOGRAPHY;  Surgeon: Nelva Bush, MD;  Location: [facility];  Service: Cardiovascular;   Laterality: N/A;    Prior to Admission medications   Medication Sig Start Date End Date Taking? Authorizing Provider  acetaminophen (TYLENOL) 500 MG tablet Take 500 mg by mouth as needed.     [provider]  apixaban (ELIQUIS) 5 MG TABS tablet Take 1 tablet (5 mg total) by mouth 2 (two) times daily. 02/13/18   Minna Merritts, MD  clopidogrel (PLAVIX) 75 MG tablet Take 1 tablet (75 mg total) by mouth daily with breakfast. 06/08/18   Loletha Grayer, MD  Coenzyme Q10 (CO Q-10) 200 MG CAPS Take 200 mg by mouth daily.    [provider]  Cyanocobalamin (VITAMIN B-12 IJ) Inject 1 vial into the muscle every 30 (thirty) days.     [provider]  diazepam (VALIUM) 5 MG tablet Take 5 mg by mouth 3 (three) times daily as needed for anxiety or muscle spasms.     [provider]  diltiazem (CARDIZEM CD) 180 MG 24 hr capsule Take 180 mg by mouth daily before breakfast.     [provider]  feeding supplement, ENSURE ENLIVE, (ENSURE ENLIVE) LIQD Take 237 mLs by mouth daily. 06/08/18   Loletha Grayer, MD  glimepiride (AMARYL) 4 MG tablet Take 4 mg by mouth 2 (two) times daily.    [provider]  hydrochlorothiazide (HYDRODIURIL) 25 MG tablet Take 25 mg by mouth daily.    [provider]  lisinopril (PRINIVIL,ZESTRIL) 10 MG tablet TAKE ONE TABLET BY MOUTH ONCE DAILY Patient taking differently: Take 10 mg by mouth daily.  07/18/15   Minna Merritts, MD  magnesium oxide (MAG-OX) 400 MG tablet Take 400 mg by mouth daily.    [provider]  Multiple Vitamin (MULTIVITAMIN WITH MINERALS) TABS tablet Take 1 tablet by mouth daily. 06/08/18   Loletha Grayer, MD  omega-3 acid ethyl esters (LOVAZA) 1 g capsule Take 2 g by mouth daily.    [provider]  omeprazole (PRILOSEC) 20 MG capsule Take 20 mg by mouth 2 (two) times daily.     [provider]  oxyCODONE-acetaminophen (PERCOCET) 10-325 MG tablet Take 1 tablet by  mouth every 4 (four) hours as needed for pain.     [provider]  sildenafil (VIAGRA) 100 MG tablet Take 100 mg by mouth daily as needed for erectile dysfunction.     [provider]  simvastatin (ZOCOR) 40 MG tablet Take 40 mg by mouth daily.    [provider]  sucralfate (CARAFATE) 1 g tablet 1 g 3 (three) times daily. Patient dissolves this in 1 tablespoon of warm water each time.    [provider]  testosterone cypionate (DEPOTESTOSTERONE CYPIONATE) 200 MG/ML injection Inject 200 mg into the muscle every 28 (twenty-eight) days.     [provider]    Allergies Amitriptyline; Lyrica [pregabalin]; Metformin; Neurontin [gabapentin]; Nitroglycerin; Novocain [procaine]; and Procaine hcl  Family History  Problem Relation Age of Onset  . Heart  failure Mother   . Heart disease Father   . Heart attack Brother   . Skin cancer Sister   . Kidney disease Neg Hx   . Prostate cancer Neg Hx   . Kidney cancer Neg Hx   . Bladder Cancer Neg Hx     Social History Social History   Tobacco Use  . Smoking status: Former Smoker    Packs/day: 3.00    Years: 15.00    Pack years: 45.00    Last attempt to quit: 10/18/1985    Years since quitting: 32.6  . Smokeless tobacco: Former Systems developer  . Tobacco comment: tobacco use- no   Substance Use Topics  . Alcohol use: Yes    Comment: rare  . Drug use: No    Review of Systems Constitutional: No fever/chills Eyes: No visual changes. ENT: No sore throat. No stiff neck no neck pain Cardiovascular: Denies chest pain. Respiratory: Denies shortness of breath. Gastrointestinal:   no vomiting.  No diarrhea.  No constipation. Genitourinary: Negative for dysuria. Musculoskeletal: Negative lower extremity swelling Skin: Negative for rash. Neurological: Negative for severe headaches, focal weakness or numbness.   ____________________________________________   PHYSICAL EXAM:  VITAL SIGNS: ED Triage Vitals   Enc Vitals Group     BP 06/14/18 1436 (!) 150/59     Pulse Rate 06/14/18 1436 82     Resp --      Temp 06/14/18 1436 (!) 97.5 F (36.4 C)     Temp Source 06/14/18 1436 Oral     SpO2 06/14/18 1436 98 %     Weight 06/14/18 1436 241 lb (109.3 kg)     Height 06/14/18 1436 6\' 1"  (1.854 m)     Head Circumference --      Peak Flow --      Pain Score 06/14/18 1618 9     Pain Loc --      Pain Edu? --      Excl. in [place]? --     Constitutional: Alert and oriented. Well appearing and in no acute distress. Eyes: Conjunctivae are normal Head: Atraumatic HEENT: No congestion/rhinnorhea. Mucous membranes are moist.  Oropharynx non-erythematous Neck:   Nontender with no meningismus, no masses, no stridor Cardiovascular: Normal rate, regular rhythm. Grossly normal heart sounds.  Good peripheral circulation. Respiratory: Normal respiratory effort.  No retractions. Lungs CTAB. Abdominal: Soft and nontender. No distention. No guarding no rebound Back:  There is no focal tenderness or step off.  there is no midline tenderness there are no lesions noted. there is no CVA tenderness Normal external genitalia Musculoskeletal: No lower extremity tenderness, no upper extremity tenderness. No joint effusions, no DVT signs strong distal pulses no edema Neurologic:  Normal speech and language. No gross focal neurologic deficits are appreciated.  Skin:  Skin is warm, dry and intact. No rash noted. Psychiatric: Mood and affect are normal. Speech and behavior are normal.  ____________________________________________   LABS (all labs ordered are listed, but only abnormal results are displayed)  Labs Reviewed  URINALYSIS, COMPLETE (UACMP) WITH MICROSCOPIC - Abnormal; Notable for the following components:      Result Value   Color, Urine YELLOW (*)    APPearance CLEAR (*)    Glucose, UA 50 (*)    Hgb urine dipstick LARGE (*)    Protein, ur 30 (*)    RBC / HPF >50 (*)    All other components within normal  limits  CBC WITH DIFFERENTIAL/PLATELET - Abnormal; Notable for the following components:  RDW 16.1 (*)    Platelets 79 (*)    Monocytes Absolute 2.8 (*)    All other components within normal limits  COMPREHENSIVE METABOLIC PANEL - Abnormal; Notable for the following components:   Glucose, Bld 183 (*)    Calcium 10.5 (*)    Total Bilirubin 2.3 (*)    All other components within normal limits    Pertinent labs  results that were available during my care of the patient were reviewed by me and considered in my medical decision making (see chart for details). ____________________________________________  EKG  I personally interpreted any EKGs ordered by me or triage Sinus rhythm rate 61 bpm no acute ST elevation or depression nonspecific ST changes ____________________________________________  RADIOLOGY  Pertinent labs & imaging results that were available during my care of the patient were reviewed by me and considered in my medical decision making (see chart for details). If possible, patient and/or family made aware of any abnormal findings.  No results found. ____________________________________________    PROCEDURES  Procedure(s) performed: None  Procedures  Critical Care performed: None  ____________________________________________   INITIAL IMPRESSION / ASSESSMENT AND PLAN / ED COURSE  Pertinent labs & imaging results that were available during my care of the patient were reviewed by me and considered in my medical decision making (see chart for details).  Patient here with a blush of hematuria, he does have red blood cells in his urine he does have low platelets which is baseline for him, I talked to his cardiologist, Dr. Stanford Breed, who commended EKG to see if the patient happened to be in A. fib at this time which he is not.  Patient has no chest pain or shortness of breath hemoglobin is reassuring.  He feels that given recent stents Plavix is more important at this  point in Eliquis although there is a risk of taking off Eliquis which is a a history of atrial fibrillation.  Patient has no history of CVA.  Neurologist would prefer to keep the Plavix and get rid of the Eliquis given new bleeding, patient has a urologist and will follow-up.  I did explain to the patient that there is a risk to going off Eliquis to include CVA, and he is comfortable with this risk.  He has been off Eliquis before.  Given his symptoms of new hematuria with these anticoagulation medications he will stay on the Plavix, bleeding precautions and return precautions were given and understood.    ____________________________________________   FINAL CLINICAL IMPRESSION(S) / ED DIAGNOSES  Final diagnoses:  None      This chart was dictated using voice recognition software.  Despite best efforts to proofread,  errors can occur which can change meaning.      Schuyler Amor, MD 06/14/18 [phone]

## 2018-06-14 NOTE — Telephone Encounter (Signed)
Called and s/w patient. He states he did not start the plavix until yesterday evening when he took his Eliquis. He also took Eliquis this morning. He saw Dr Rockey Situ yesterday who advised him to continue both Eliquis and plavix. Patient had not continued both when discharged from the hospital on 10/3 as advised. This morning he started having pink to dark pink tinged urine. Says throughout the day it starts out bright pink then gets lighter. He complains of lower back pain but states he always has lower back pain so he is not sure if this is his kidneys or not.   Discussed with Christell Faith, PA-C who advised for patient to go to the ER for evaluation.  Patient verbalized understanding and also called his wife who verbalized understanding as well.

## 2018-06-14 NOTE — Discharge Instructions (Addendum)
Cardiologist would like you to stop the Eliquis at this time and continue the Plavix.  If you have severe bleeding, lightheadedness or you feel worse in any way return to the emergency room.  This would also include if you have significant pain or difficulty voiding or fever.  We have also recommended that you follow-up with your urologist tomorrow.  As you cannot remember their name, we are giving you information about who is on-call for this facility for urology but we do strongly advised that you follow closely with your urologist in the day or 2 for this as well.  There is some concern about stopping Eliquis as it does slightly increase her your chance of stroke with a history of atrial fibrillation.  If you have any numbness or weakness or other concerns return to the emergency department

## 2018-06-14 NOTE — Telephone Encounter (Signed)
Pt c/o medication issue:  1. Name of Medication: Plavix   2. How are you currently taking this medication (dosage and times per day)? 75 mg po q day   3. Are you having a reaction (difficulty breathing--STAT)? Yes   4. What is your medication issue? Tinged pink this morning in urine and now more red in color .  Patient daughter says he took plavix and eliquis last night and eliquis this morning . Please call to discuss

## 2018-06-14 NOTE — ED Notes (Signed)
Pt has been on Eliquis for 6 months, recently started on Plavix also (within the last week). Pt was here and had cardiac stent placed a week or so ago

## 2018-06-14 NOTE — ED Triage Notes (Signed)
First Nurse Note:  C/O Rectal Bleeding, onset of symptoms this am.  Sent for ED evaluation by PCP.  AAOx3.  Skin warm and dry. NAD

## 2018-07-05 ENCOUNTER — Other Ambulatory Visit: Payer: Self-pay | Admitting: Cardiovascular Disease

## 2018-07-12 ENCOUNTER — Ambulatory Visit: Payer: Medicare HMO | Admitting: Nurse Practitioner

## 2018-07-12 ENCOUNTER — Other Ambulatory Visit: Payer: Self-pay | Admitting: Cardiovascular Disease

## 2018-07-17 ENCOUNTER — Encounter: Admission: RE | Payer: Self-pay | Source: Ambulatory Visit

## 2018-07-17 ENCOUNTER — Ambulatory Visit
Admission: RE | Admit: 2018-07-17 | Payer: Medicare HMO | Source: Ambulatory Visit | Admitting: Unknown Physician Specialty

## 2018-07-17 SURGERY — EGD (ESOPHAGOGASTRODUODENOSCOPY)
Anesthesia: General

## 2018-09-16 DIAGNOSIS — R69 Illness, unspecified: Secondary | ICD-10-CM | POA: Diagnosis not present

## 2018-09-25 ENCOUNTER — Other Ambulatory Visit: Payer: Self-pay

## 2018-09-25 ENCOUNTER — Ambulatory Visit: Payer: Medicare HMO | Admitting: Urology

## 2018-09-25 ENCOUNTER — Encounter: Payer: Self-pay | Admitting: Urology

## 2018-09-25 VITALS — BP 138/69 | HR 96 | Temp 96.1°F | Ht 73.0 in | Wt 248.0 lb

## 2018-09-25 DIAGNOSIS — N481 Balanitis: Secondary | ICD-10-CM | POA: Diagnosis not present

## 2018-09-25 DIAGNOSIS — N529 Male erectile dysfunction, unspecified: Secondary | ICD-10-CM | POA: Diagnosis not present

## 2018-09-25 MED ORDER — NYSTATIN 100000 UNIT/GM EX CREA: 1.0000 "application " | TOPICAL_CREAM | Freq: Two times a day (BID) | CUTANEOUS | 0 refills | Status: DC

## 2018-09-25 NOTE — Progress Notes (Signed)
3:40 PM   JABRE HEO 10/01/1940 [id number]  Referring provider: Baxter Hire, MD [place], [place] [address]  Chief Complaint  Patient presents with  . Rash    rash on penis    HPI: Patient is a 78 year old Caucasian diabetic male with BPH with LUTS and ED who presents today for the complaint of rash on the penis.    Patient states his rash has been present on his penis for several months.   He states it comes and goes.  He has been trying OTC without relief.   He also has tried changing soap and detergent without a clearance of the rash.   He has not had any penile discharge or dysuria.  He is not sexually active.  Patient denies any gross hematuria or suprapubic/flank pain.  Patient denies any fevers, chills, nausea or vomiting.   Erectile dysfunction SHIM score is 15, mild to moderate.  He has been having difficulty with erections for several years.   His major complaint is achieving an erection.  His libido is preserved.   His risk factors for ED are age, hyperlipidemia, diabetes, anticoagulation therapy, hypertension, testosterone deficiency and BPH.  He denies any painful erections or curvatures with his erections.   He has tried PDE 5 inhibitors in the past, but they have been ineffective.  He was given a prescription for Trimix injections, but he found it cost prohibitive.  He stated that he is just using the Viagra.  He is now interested in starting the Trimix injections.     Testosterone deficiency Managed by his PCP with testosterone cypionate injections.  PMH: Past Medical History:  Diagnosis Date  . Arthritis    degenerative back   . BPH with obstruction/lower urinary tract symptoms   . CAD (coronary artery disease) 2010   stent  . Diabetes mellitus   . ED (erectile dysfunction)   . Elevated PSA   . GERD (gastroesophageal reflux disease)   . Gross hematuria   . H/O CT scan    recent renal CT due to hematuria, cystoscopy - normal    .  Hematuria   . History of hiatal hernia   . History of kidney stones   . HLD (hyperlipidemia)   . Hypertension   . Ischemic heart disease 1991   with angioplasty  . Kidney stone   . Morbid obesity ([place])   . Myocardial infarction ([place])   . Peripheral neuropathy   . Pernicious anemia   . Pneumonia 1995   /w PE,   . Pulmonary embolism ([place])    previous  . Renal cyst   . Sleep apnea    pt. denies sleep apnea, pt. states he has had 2 studies - last one 2011  . Spinal stenosis     Surgical History: Past Surgical History:  Procedure Laterality Date  . APPENDECTOMY    . BACK SURGERY     x3 back surgeries - /w fusioin, 1- [place]  . bilateral inguinal hernia repair    . cad     stent 2010  . CARDIAC CATHETERIZATION  08/17/2014  . CARPAL TUNNEL RELEASE  2014   bilateral   . CHOLECYSTECTOMY    . CORONARY STENT INTERVENTION N/A 06/07/2018   Procedure: CORONARY STENT INTERVENTION;  Surgeon: Nelva Bush, MD;  Location: [facility];  Service: Cardiovascular;  Laterality: N/A;  . LAMINECTOMY    . LEFT HEART CATH AND CORONARY  ANGIOGRAPHY N/A 06/07/2018   Procedure: LEFT HEART CATH AND CORONARY ANGIOGRAPHY;  Surgeon: Nelva Bush, MD;  Location: [facility];  Service: Cardiovascular;  Laterality: N/A;    Home Medications:  Allergies as of 09/25/2018      Reactions   Amitriptyline    Lyrica [pregabalin]    Metformin Nausea Only   Neurontin [gabapentin]    Nitroglycerin Other (See Comments)   Makes Blood pressure go to low.    Novocain [procaine]    Procaine Hcl Other (See Comments)   Passes out      Medication List       Accurate as of September 25, 2018  3:40 PM. Always use your most recent med list.        acetaminophen 500 MG tablet Commonly known as:  TYLENOL Take 500 mg by mouth as needed.   atorvastatin 40 MG tablet Commonly known as:  LIPITOR TAKE 1 TABLET BY MOUTH EVERYDAY AT BEDTIME   clopidogrel 75 MG tablet Commonly  known as:  PLAVIX TAKE 1 TABLET (75 MG TOTAL) BY MOUTH DAILY WITH BREAKFAST.   Co Q-10 200 MG Caps Take 200 mg by mouth daily.   diltiazem 180 MG 24 hr capsule Commonly known as:  CARDIZEM CD Take 180 mg by mouth daily before breakfast.   feeding supplement (ENSURE ENLIVE) Liqd Take 237 mLs by mouth daily.   glimepiride 4 MG tablet Commonly known as:  AMARYL Take 4 mg by mouth 2 (two) times daily.   hydrochlorothiazide 25 MG tablet Commonly known as:  HYDRODIURIL Take 25 mg by mouth daily.   lisinopril 10 MG tablet Commonly known as:  PRINIVIL,ZESTRIL TAKE ONE TABLET BY MOUTH ONCE DAILY   multivitamin with minerals Tabs tablet Take 1 tablet by mouth daily.   nystatin cream Commonly known as:  MYCOSTATIN Apply 1 application topically 2 (two) times daily.   omega-3 acid ethyl esters 1 g capsule Commonly known as:  LOVAZA Take 2 g by mouth daily.   omeprazole 20 MG capsule Commonly known as:  PRILOSEC Take 20 mg by mouth 2 (two) times daily.   oxyCODONE-acetaminophen 10-325 MG tablet Commonly known as:  PERCOCET Take 1 tablet by mouth every 4 (four) hours as needed for pain.   simvastatin 40 MG tablet Commonly known as:  ZOCOR Take 40 mg by mouth daily.   testosterone cypionate 200 MG/ML injection Commonly known as:  DEPOTESTOSTERONE CYPIONATE Inject 200 mg into the muscle every 28 (twenty-eight) days.   VALIUM 5 MG tablet Generic drug:  diazepam Take 5 mg by mouth 3 (three) times daily as needed for anxiety or muscle spasms.   VIAGRA 100 MG tablet Generic drug:  sildenafil Take 100 mg by mouth daily as needed for erectile dysfunction.   VITAMIN B-12 IJ Inject 1 vial into the muscle every 30 (thirty) days.       Allergies:  Allergies  Allergen Reactions  . Amitriptyline   . Lyrica [Pregabalin]   . Metformin Nausea Only  . Neurontin [Gabapentin]   . Nitroglycerin Other (See Comments)    Makes Blood pressure go to low.   . Novocain [Procaine]   .  Procaine Hcl Other (See Comments)    Passes out    Family History: Family History  Problem Relation Age of Onset  . Heart failure Mother   . Heart disease Father   . Heart attack Brother   . Skin cancer Sister   . Kidney disease Neg Hx   . Prostate  cancer Neg Hx   . Kidney cancer Neg Hx   . Bladder Cancer Neg Hx     Social History:  reports that he quit smoking about 32 years ago. He has a 45.00 pack-year smoking history. He has quit using smokeless tobacco. He reports current alcohol use. He reports that he does not use drugs.  ROS: UROLOGY Frequent Urination?: No Hard to postpone urination?: No Burning/pain with urination?: No Get up at night to urinate?: No Leakage of urine?: No Urine stream starts and stops?: No Trouble starting stream?: No Do you have to strain to urinate?: No Blood in urine?: No Urinary tract infection?: No Sexually transmitted disease?: No Injury to kidneys or bladder?: No Painful intercourse?: No Weak stream?: No Erection problems?: No Penile pain?: No  Gastrointestinal Nausea?: No Vomiting?: No Indigestion/heartburn?: No Diarrhea?: No Constipation?: No  Constitutional Fever: No Night sweats?: No Weight loss?: No Fatigue?: No  Skin Skin rash/lesions?: Yes Itching?: No  Eyes Blurred vision?: No Double vision?: No  Ears/Nose/Throat Sore throat?: No Sinus problems?: No  Hematologic/Lymphatic Swollen glands?: No Easy bruising?: No  Cardiovascular Leg swelling?: No Chest pain?: No  Respiratory Cough?: No Shortness of breath?: No  Endocrine Excessive thirst?: No  Musculoskeletal Back pain?: Yes Joint pain?: No  Neurological Headaches?: No Dizziness?: No  Psychologic Depression?: No Anxiety?: No  Physical Exam: BP 138/69   Pulse 96   Temp (!) 96.1 F (35.6 C)   Ht 6\' 1"  (1.854 m)   Wt 248 lb (112.5 kg)   BMI 32.72 kg/m   Constitutional:  Well nourished. Alert and oriented, No acute distress. HEENT:  [place] AT, moist mucus membranes.  Trachea midline, no masses. Cardiovascular: No clubbing, cyanosis, or edema. Respiratory: Normal respiratory effort, no increased work of breathing. GI: Abdomen is soft, non tender, non distended, no abdominal masses. Liver and spleen not palpable.  No hernias appreciated.  Stool sample for occult testing is not indicated.   GU: No CVA tenderness.  No bladder fullness or masses.  Patient with uncircumcised phallus. Foreskin easily retracted.  Balanitis present on the glans.  Urethral meatus is patent.  No penile discharge. No penile lesions or rashes. Scrotum without lesions, cysts, rashes and/or edema.  Testicles are located scrotally bilaterally. No masses are appreciated in the testicles. Left and right epididymis are normal. Rectal: Deferred. Skin: No rashes, bruises or suspicious lesions. Neurologic: Grossly intact, no focal deficits, moving all 4 extremities.  Has neuropathy in both hands.   Psychiatric: Normal mood and affect.  Laboratory Data: Lab Results  Component Value Date   WBC 8.7 06/14/2018   HGB 14.6 06/14/2018   HCT 40.9 06/14/2018   MCV 88.1 06/14/2018   PLT 79 (L) 06/14/2018   Lab Results  Component Value Date   CREATININE 0.96 06/14/2018   Lab Results  Component Value Date   AST 15 06/14/2018   Lab Results  Component Value Date   ALT 18 06/14/2018   I have reviewed the labs.  Assessment & Plan:    1. Balanitis  - explained to the patient that this is due to a yeast infection on the head of his penis and his not being circumcised with DM makes him more prone to have this  - advised to pull back the foreskin and wash with soapy water and completely dry the head of the penis and pull the foreskin back over the glans BID  - sent a prescription for Nystatin cream to be applied BID  - patient  would like a circumcision, but I explained that he could not be done in the office due to risk of bleeding due to his anticoagulation and  thrombocytopenia and he would be a poor surgical candidate at this time due to his recent MI (06/2018)  - will RTC in 12/2018 for recheck  2. Erectile dysfunction  - explained to the patient that due to his recent MI (06/2018) it would not be safe to start Trimix at this time  - will RTC in 12/2018 to reassess    Return in about 3 months (around 12/25/2018) for recheck .  These notes generated with voice recognition software. I apologize for typographical errors.  Zara Council, PA-C  [facility] [address] [place], [place] [address] [phone]

## 2018-09-27 ENCOUNTER — Other Ambulatory Visit: Payer: Self-pay | Admitting: Cardiovascular Disease

## 2018-10-02 DIAGNOSIS — E538 Deficiency of other specified B group vitamins: Secondary | ICD-10-CM | POA: Diagnosis not present

## 2018-11-02 DIAGNOSIS — E538 Deficiency of other specified B group vitamins: Secondary | ICD-10-CM | POA: Diagnosis not present

## 2018-11-02 DIAGNOSIS — R69 Illness, unspecified: Secondary | ICD-10-CM | POA: Diagnosis not present

## 2018-11-17 DIAGNOSIS — G8929 Other chronic pain: Secondary | ICD-10-CM | POA: Diagnosis not present

## 2018-11-17 DIAGNOSIS — Z6833 Body mass index (BMI) 33.0-33.9, adult: Secondary | ICD-10-CM | POA: Diagnosis not present

## 2018-11-17 DIAGNOSIS — M2550 Pain in unspecified joint: Secondary | ICD-10-CM | POA: Diagnosis not present

## 2018-11-17 DIAGNOSIS — I2510 Atherosclerotic heart disease of native coronary artery without angina pectoris: Secondary | ICD-10-CM | POA: Diagnosis not present

## 2018-11-17 DIAGNOSIS — E119 Type 2 diabetes mellitus without complications: Secondary | ICD-10-CM | POA: Diagnosis not present

## 2018-11-17 DIAGNOSIS — E785 Hyperlipidemia, unspecified: Secondary | ICD-10-CM | POA: Diagnosis not present

## 2018-11-17 DIAGNOSIS — I252 Old myocardial infarction: Secondary | ICD-10-CM | POA: Diagnosis not present

## 2018-11-17 DIAGNOSIS — E669 Obesity, unspecified: Secondary | ICD-10-CM | POA: Diagnosis not present

## 2018-11-17 DIAGNOSIS — Z7902 Long term (current) use of antithrombotics/antiplatelets: Secondary | ICD-10-CM | POA: Diagnosis not present

## 2018-11-17 DIAGNOSIS — I10 Essential (primary) hypertension: Secondary | ICD-10-CM | POA: Diagnosis not present

## 2018-12-04 DIAGNOSIS — E538 Deficiency of other specified B group vitamins: Secondary | ICD-10-CM | POA: Diagnosis not present

## 2018-12-12 DIAGNOSIS — E119 Type 2 diabetes mellitus without complications: Secondary | ICD-10-CM | POA: Diagnosis not present

## 2018-12-19 DIAGNOSIS — E119 Type 2 diabetes mellitus without complications: Secondary | ICD-10-CM | POA: Diagnosis not present

## 2018-12-19 DIAGNOSIS — G6289 Other specified polyneuropathies: Secondary | ICD-10-CM | POA: Diagnosis not present

## 2018-12-19 DIAGNOSIS — D693 Immune thrombocytopenic purpura: Secondary | ICD-10-CM | POA: Diagnosis not present

## 2018-12-19 DIAGNOSIS — Z0000 Encounter for general adult medical examination without abnormal findings: Secondary | ICD-10-CM | POA: Diagnosis not present

## 2018-12-19 DIAGNOSIS — R69 Illness, unspecified: Secondary | ICD-10-CM | POA: Diagnosis not present

## 2018-12-19 DIAGNOSIS — I10 Essential (primary) hypertension: Secondary | ICD-10-CM | POA: Diagnosis not present

## 2018-12-19 DIAGNOSIS — E7800 Pure hypercholesterolemia, unspecified: Secondary | ICD-10-CM | POA: Diagnosis not present

## 2018-12-19 DIAGNOSIS — M5416 Radiculopathy, lumbar region: Secondary | ICD-10-CM | POA: Diagnosis not present

## 2018-12-25 ENCOUNTER — Ambulatory Visit: Payer: Medicare HMO | Admitting: Urology

## 2019-01-04 DIAGNOSIS — E538 Deficiency of other specified B group vitamins: Secondary | ICD-10-CM | POA: Diagnosis not present

## 2019-02-04 DIAGNOSIS — R69 Illness, unspecified: Secondary | ICD-10-CM | POA: Diagnosis not present

## 2019-02-05 DIAGNOSIS — E538 Deficiency of other specified B group vitamins: Secondary | ICD-10-CM | POA: Diagnosis not present

## 2019-02-12 ENCOUNTER — Ambulatory Visit: Payer: Medicare HMO | Admitting: Urology

## 2019-02-16 ENCOUNTER — Telehealth: Payer: Self-pay

## 2019-02-16 NOTE — Telephone Encounter (Signed)
Virtual Visit Pre-Appointment Phone Call  "Cody Price, I am calling you today to discuss your upcoming appointment. We are currently trying to limit exposure to the virus that causes COVID-19 by seeing patients at home rather than in the office."  1. "What is the BEST phone number to call the day of the visit?" - include this in appointment notes  2. Do you have or have access to (through a family member/friend) a smartphone with video capability that we can use for your visit?" a. If yes - list this number in appt notes as cell (if different from BEST phone #) and list the appointment type as a VIDEO visit in appointment notes b. If no - list the appointment type as a PHONE visit in appointment notes  3. Confirm consent - "In the setting of the current Covid19 crisis, you are scheduled for a phone visit with your provider on 03/14/2019 at 10:40AM.  Just as we do with many in-office visits, in order for you to participate in this visit, we must obtain consent.  If you'd like, I can send this to your mychart (if signed up) or email for you to review.  Otherwise, I can obtain your verbal consent now.  All virtual visits are billed to your insurance company just like a normal visit would be.  By agreeing to a virtual visit, we'd like you to understand that the technology does not allow for your provider to perform an examination, and thus may limit your provider's ability to fully assess your condition. If your provider identifies any concerns that need to be evaluated in person, we will make arrangements to do so.  Finally, though the technology is pretty good, we cannot assure that it will always work on either your or our end, and in the setting of a video visit, we may have to convert it to a phone-only visit.  In either situation, we cannot ensure that we have a secure connection.  Are you willing to proceed?" STAFF: Did the patient verbally acknowledge consent to telehealth visit? Document YES/NO  here: YES PER WIFE Cody Price  4. Advise patient to be prepared - "Two hours prior to your appointment, go ahead and check your blood pressure, pulse, oxygen saturation, and your weight (if you have the equipment to check those) and write them all down. When your visit starts, your provider will ask you for this information. If you have an Apple Watch or Kardia device, please plan to have heart rate information ready on the day of your appointment. Please have a pen and paper handy nearby the day of the visit as well."  5. Give patient instructions for MyChart download to smartphone OR [email or web address] as below if video visit (depending on what platform provider is using)  6. Inform patient they will receive a phone call 15 minutes prior to their appointment time (may be from unknown caller ID) so they should be prepared to answer    TELEPHONE CALL NOTE  Cody Price has been deemed a candidate for a follow-up tele-health visit to limit community exposure during the Covid-19 pandemic. I spoke with the patient via phone to ensure availability of phone/video source, confirm preferred email & phone number, and discuss instructions and expectations.  I reminded Cody Price to be prepared with any vital sign and/or heart rhythm information that could potentially be obtained via home monitoring, at the time of his visit. I reminded Cody Price to expect a phone call prior  to his visit.  Cody Price 02/16/2019 9:06 AM    FULL LENGTH CONSENT FOR TELE-HEALTH VISIT   I hereby voluntarily request, consent and authorize [facility] and its employed or contracted physicians, physician assistants, nurse practitioners or other licensed health care professionals (the Practitioner), to provide me with telemedicine health care services (the Services") as deemed necessary by the treating Practitioner. I acknowledge and consent to receive the Services by the Practitioner via telemedicine. I  understand that the telemedicine visit will involve communicating with the Practitioner through live audiovisual communication technology and the disclosure of certain medical information by electronic transmission. I acknowledge that I have been given the opportunity to request an in-person assessment or other available alternative prior to the telemedicine visit and am voluntarily participating in the telemedicine visit.  I understand that I have the right to withhold or withdraw my consent to the use of telemedicine in the course of my care at any time, without affecting my right to future care or treatment, and that the Practitioner or I may terminate the telemedicine visit at any time. I understand that I have the right to inspect all information obtained and/or recorded in the course of the telemedicine visit and may receive copies of available information for a reasonable fee.  I understand that some of the potential risks of receiving the Services via telemedicine include:   Delay or interruption in medical evaluation due to technological equipment failure or disruption;  Information transmitted may not be sufficient (e.g. poor resolution of images) to allow for appropriate medical decision making by the Practitioner; and/or   In rare instances, security protocols could fail, causing a breach of personal health information.  Furthermore, I acknowledge that it is my responsibility to provide information about my medical history, conditions and care that is complete and accurate to the best of my ability. I acknowledge that Practitioner's advice, recommendations, and/or decision may be based on factors not within their control, such as incomplete or inaccurate data provided by me or distortions of diagnostic images or specimens that may result from electronic transmissions. I understand that the practice of medicine is not an exact science and that Practitioner makes no warranties or guarantees  regarding treatment outcomes. I acknowledge that I will receive a copy of this consent concurrently upon execution via email to the email address I last provided but may also request a printed copy by calling the office of [facility].    I understand that my insurance will be billed for this visit.   I have read or had this consent read to me.  I understand the contents of this consent, which adequately explains the benefits and risks of the Services being provided via telemedicine.   I have been provided ample opportunity to ask questions regarding this consent and the Services and have had my questions answered to my satisfaction.  I give my informed consent for the services to be provided through the use of telemedicine in my medical care  By participating in this telemedicine visit I agree to the above.

## 2019-03-08 DIAGNOSIS — E538 Deficiency of other specified B group vitamins: Secondary | ICD-10-CM | POA: Diagnosis not present

## 2019-03-11 NOTE — Progress Notes (Signed)
4:01 PM   Cody Price July 15, 1941 [id number]  Referring provider: Baxter Hire, MD [place],  [place] [address]  Chief Complaint  Patient presents with  . Benign Prostatic Hypertrophy    3 month    HPI: Cody Price is a 78 year old diabetic male with a history of a penile rash, BPH with LUTS and ED who presents today for follow up.    Penile rash He states the rash is still occurring on and off, but the nystatin cream is helping.    BPH WITH LUTS  (prostate and/or bladder) IPSS score: 9/5    PVR: 26  mL     Previous score: 4/0 Previous PVR: 50 mL  Major complaint(s):  Frequency x a lot, urgency, nocturia x 3-4, intermittency and straining to urinate x a few months years. Denies any dysuria, hematuria or suprapubic pain.  He wants medicine for his prostate.   Denies any recent fevers, chills, nausea or vomiting.  IPSS    Row Name 03/12/19 1300         International Prostate Symptom Score   How often have you had the sensation of not emptying your bladder?  Not at All     How often have you had to urinate less than every two hours?  Less than half the time     How often have you found you stopped and started again several times when you urinated?  Less than half the time     How often have you found it difficult to postpone urination?  Less than 1 in 5 times     How often have you had a weak urinary stream?  Not at All     How often have you had to strain to start urination?  Less than 1 in 5 times     How many times did you typically get up at night to urinate?  3 Times     Total IPSS Score  9       Quality of Life due to urinary symptoms   If you were to spend the rest of your life with your urinary condition just the way it is now how would you feel about that?  Unhappy        Score:  1-7 Mild 8-19 Moderate 20-35 Severe   Erectile dysfunction SHIM score is 4, which is severe ED.  His previous SHIM was 15.  He has been having difficulty  with erections for several years.   His major complaint is achieving an erection.  His libido is preserved.   His risk factors for ED are age, hyperlipidemia, diabetes, MI, anticoagulation therapy, hypertension, testosterone deficiency and BPH.  He denies any painful erections or curvatures with his erections.   He has tried PDE 5 inhibitors in the past, but they have been ineffective.  He would like to try the Trimix at this time.  SHIM    Row Name 03/12/19 1333         SHIM: Over the last 6 months:   How do you rate your confidence that you could get and keep an erection?  Very Low     When you had erections with sexual stimulation, how often were your erections hard enough for penetration (entering your partner)?  Almost Never or Never     During sexual intercourse, how often were you able to maintain your erection after you had penetrated (entered) your partner?  Almost  Never or Never     During sexual intercourse, how difficult was it to maintain your erection to completion of intercourse?  Extremely Difficult       SHIM Total Score   SHIM  4        Testosterone deficiency Managed by his PCP with testosterone cypionate injections.  PMH: Past Medical History:  Diagnosis Date  . Arthritis    degenerative back   . BPH with obstruction/lower urinary tract symptoms   . CAD (coronary artery disease) 2010   stent  . Diabetes mellitus   . ED (erectile dysfunction)   . Elevated PSA   . GERD (gastroesophageal reflux disease)   . Gross hematuria   . H/O CT scan    recent renal CT due to hematuria, cystoscopy - normal    . Hematuria   . History of hiatal hernia   . History of kidney stones   . HLD (hyperlipidemia)   . Hypertension   . Ischemic heart disease 1991   with angioplasty  . Kidney stone   . Morbid obesity ([place])   . Myocardial infarction ([place])   . Peripheral neuropathy   . Pernicious anemia   . Pneumonia 1995   /w PE,   . Pulmonary embolism ([place])    previous  .  Renal cyst   . Sleep apnea    pt. denies sleep apnea, pt. states he has had 2 studies - last one 2011  . Spinal stenosis     Surgical History: Past Surgical History:  Procedure Laterality Date  . APPENDECTOMY    . BACK SURGERY     x3 back surgeries - /w fusioin, 1- [place]  . bilateral inguinal hernia repair    . cad     stent 2010  . CARDIAC CATHETERIZATION  08/17/2014  . CARPAL TUNNEL RELEASE  2014   bilateral   . CHOLECYSTECTOMY    . CORONARY STENT INTERVENTION N/A 06/07/2018   Procedure: CORONARY STENT INTERVENTION;  Surgeon: Nelva Bush, MD;  Location: [facility];  Service: Cardiovascular;  Laterality: N/A;  . LAMINECTOMY    . LEFT HEART CATH AND CORONARY ANGIOGRAPHY N/A 06/07/2018   Procedure: LEFT HEART CATH AND CORONARY ANGIOGRAPHY;  Surgeon: Nelva Bush, MD;  Location: [facility];  Service: Cardiovascular;  Laterality: N/A;    Home Medications:  Allergies as of 03/12/2019      Reactions   Amitriptyline    Lyrica [pregabalin]    Metformin Nausea Only   Neurontin [gabapentin]    Nitroglycerin Other (See Comments)   Makes Blood pressure go to low.    Novocain [procaine]    Procaine Hcl Other (See Comments)   Passes out      Medication List       Accurate as of March 12, 2019  4:01 PM. If you have any questions, ask your nurse or doctor.        acetaminophen 500 MG tablet Commonly known as: TYLENOL Take 500 mg by mouth as needed.   atorvastatin 40 MG tablet Commonly known as: LIPITOR TAKE 1 TABLET BY MOUTH EVERYDAY AT BEDTIME   clopidogrel 75 MG tablet Commonly known as: PLAVIX TAKE 1 TABLET (75 MG TOTAL) BY MOUTH DAILY WITH BREAKFAST.   Co Q-10 200 MG Caps Take 200 mg by mouth daily.   diltiazem 180 MG 24 hr capsule Commonly known as: CARDIZEM CD Take 180 mg by mouth daily before breakfast.   feeding supplement (ENSURE ENLIVE) Liqd  Take 237 mLs by mouth daily.   finasteride 5 MG tablet Commonly known as:  Proscar Take 1 tablet (5 mg total) by mouth daily. Started by: Zara Council, PA-C   glimepiride 4 MG tablet Commonly known as: AMARYL Take 4 mg by mouth 2 (two) times daily.   hydrochlorothiazide 25 MG tablet Commonly known as: HYDRODIURIL Take 25 mg by mouth daily.   lisinopril 10 MG tablet Commonly known as: ZESTRIL TAKE ONE TABLET BY MOUTH ONCE DAILY   multivitamin with minerals Tabs tablet Take 1 tablet by mouth daily.   nystatin cream Commonly known as: MYCOSTATIN Apply 1 application topically 2 (two) times daily.   omega-3 acid ethyl esters 1 g capsule Commonly known as: LOVAZA Take 2 g by mouth daily.   omeprazole 20 MG capsule Commonly known as: PRILOSEC Take 20 mg by mouth 2 (two) times daily.   oxyCODONE-acetaminophen 10-325 MG tablet Commonly known as: PERCOCET Take 1 tablet by mouth every 4 (four) hours as needed for pain.   simvastatin 40 MG tablet Commonly known as: ZOCOR Take 40 mg by mouth daily.   tamsulosin 0.4 MG Caps capsule Commonly known as: FLOMAX Take 1 capsule (0.4 mg total) by mouth daily. Started by: Zara Council, PA-C   testosterone cypionate 200 MG/ML injection Commonly known as: DEPOTESTOSTERONE CYPIONATE Inject 200 mg into the muscle every 28 (twenty-eight) days.   Valium 5 MG tablet Generic drug: diazepam Take 5 mg by mouth 3 (three) times daily as needed for anxiety or muscle spasms.   Viagra 100 MG tablet Generic drug: sildenafil Take 100 mg by mouth daily as needed for erectile dysfunction.   VITAMIN B-12 IJ Inject 1 vial into the muscle every 30 (thirty) days.       Allergies:  Allergies  Allergen Reactions  . Amitriptyline   . Lyrica [Pregabalin]   . Metformin Nausea Only  . Neurontin [Gabapentin]   . Nitroglycerin Other (See Comments)    Makes Blood pressure go to low.   . Novocain [Procaine]   . Procaine Hcl Other (See Comments)    Passes out    Family History: Family History  Problem Relation  Age of Onset  . Heart failure Mother   . Heart disease Father   . Heart attack Brother   . Skin cancer Sister   . Kidney disease Neg Hx   . Prostate cancer Neg Hx   . Kidney cancer Neg Hx   . Bladder Cancer Neg Hx     Social History:  reports that he quit smoking about 33 years ago. He has a 45.00 pack-year smoking history. He has quit using smokeless tobacco. He reports current alcohol use. He reports that he does not use drugs.  ROS: UROLOGY Frequent Urination?: Yes Hard to postpone urination?: Yes Burning/pain with urination?: No Get up at night to urinate?: Yes Leakage of urine?: No Urine stream starts and stops?: Yes Trouble starting stream?: No Do you have to strain to urinate?: Yes Blood in urine?: No Urinary tract infection?: No Sexually transmitted disease?: No Injury to kidneys or bladder?: No Painful intercourse?: No Weak stream?: No Erection problems?: Yes Penile pain?: No  Gastrointestinal Nausea?: No Vomiting?: No Indigestion/heartburn?: No Diarrhea?: No Constipation?: No  Constitutional Fever: No Night sweats?: No Weight loss?: No Fatigue?: No  Skin Skin rash/lesions?: No Itching?: No  Eyes Blurred vision?: No Double vision?: No  Ears/Nose/Throat Sore throat?: No Sinus problems?: No  Hematologic/Lymphatic Swollen glands?: No Easy bruising?: No  Cardiovascular Leg swelling?:  No Chest pain?: Yes  Respiratory Cough?: No Shortness of breath?: No  Endocrine Excessive thirst?: No  Musculoskeletal Back pain?: Yes Joint pain?: No  Neurological Headaches?: No Dizziness?: No  Psychologic Depression?: No Anxiety?: No  Physical Exam: BP 123/66   Pulse (!) 101   Ht 6\' 1"  (1.854 m)   Wt 260 lb (117.9 kg)   BMI 34.30 kg/m   Constitutional:  Well nourished. Alert and oriented, No acute distress. HEENT: [place] AT, moist mucus membranes.  Trachea midline, no masses. Cardiovascular: No clubbing, cyanosis, or edema. Respiratory:  Normal respiratory effort, no increased work of breathing. GI: Abdomen is soft, non tender, non distended, no abdominal masses. Liver and spleen not palpable.  No hernias appreciated.  Stool sample for occult testing is not indicated.   GU: No CVA tenderness.  No bladder fullness or masses.  Patient with uncircumcised phallus.  Foreskin easily retracted.  Urethral meatus is patent.  No penile discharge. No penile lesions or rashes. Scrotum without lesions, cysts, rashes and/or edema.  Testicles are located scrotally bilaterally. No masses are appreciated in the testicles. Left and right epididymis are normal. Rectal: Patient with  normal sphincter tone. Anus and perineum without scarring or rashes. No rectal masses are appreciated. Prostate is approximately 60 grams, no nodules are appreciated.  Neurologic: Grossly intact, no focal deficits, moving all 4 extremities. Psychiatric: Normal mood and affect.  Laboratory Data: Lab Results  Component Value Date   WBC 8.7 06/14/2018   HGB 14.6 06/14/2018   HCT 40.9 06/14/2018   MCV 88.1 06/14/2018   PLT 79 (L) 06/14/2018   Lab Results  Component Value Date   CREATININE 0.96 06/14/2018   Lab Results  Component Value Date   AST 15 06/14/2018   Lab Results  Component Value Date   ALT 18 06/14/2018   I have reviewed the labs.  Assessment & Plan:    1. Balanitis Controlled with the Nystatin cream  2. Erectile dysfunction He has an appointment with his cardiologist this week and will seek clearance for sexual activity  3. BPH with LUTS IPSS score is 9/5, it is worsening Continue conservative management, avoiding bladder irritants and timed voiding's Most bothersome symptoms is/are frequency, urgency, nocturia, intermittency and straining to urinate  Initiate alpha-blocker (tamsulosin), discussed side effects  Initiate 5 alpha reductase inhibitor (finasteride), discussed side effects  RTC in 3 months for I PSS and PVR    Return in  about 3 months (around 06/12/2019) for IPSS and PVR.  These notes generated with voice recognition software. I apologize for typographical errors.  Zara Council, PA-C  [facility] [address] [place], [place] [address] [phone]

## 2019-03-12 ENCOUNTER — Other Ambulatory Visit: Payer: Self-pay

## 2019-03-12 ENCOUNTER — Ambulatory Visit: Payer: Medicare HMO | Admitting: Urology

## 2019-03-12 ENCOUNTER — Encounter: Payer: Self-pay | Admitting: Urology

## 2019-03-12 VITALS — BP 123/66 | HR 101 | Ht 73.0 in | Wt 260.0 lb

## 2019-03-12 DIAGNOSIS — N401 Enlarged prostate with lower urinary tract symptoms: Secondary | ICD-10-CM

## 2019-03-12 DIAGNOSIS — N481 Balanitis: Secondary | ICD-10-CM

## 2019-03-12 DIAGNOSIS — N529 Male erectile dysfunction, unspecified: Secondary | ICD-10-CM | POA: Diagnosis not present

## 2019-03-12 DIAGNOSIS — N138 Other obstructive and reflux uropathy: Secondary | ICD-10-CM

## 2019-03-12 LAB — BLADDER SCAN AMB NON-IMAGING

## 2019-03-12 MED ORDER — TAMSULOSIN HCL 0.4 MG PO CAPS: 0.4000 mg | ORAL_CAPSULE | Freq: Every day | ORAL | 3 refills | Status: DC

## 2019-03-12 MED ORDER — FINASTERIDE 5 MG PO TABS: 5.0000 mg | ORAL_TABLET | Freq: Every day | ORAL | 3 refills | Status: DC

## 2019-03-13 ENCOUNTER — Telehealth: Payer: Self-pay | Admitting: Cardiovascular Disease

## 2019-03-13 NOTE — Telephone Encounter (Signed)

## 2019-03-13 NOTE — Progress Notes (Signed)
Cardiology Office Note  Date:  03/14/2019   ID:  Cody Price, DOB [birth year], MRN [id number]  PCP:  Baxter Hire, MD   Chief Complaint  Patient presents with  . office visit    6 month F/U    HPI:  Cody Price is a 78 year old gentleman with a history of  coronary artery disease,  PTCA in 1991 of his RCA that later occluded 100%,  chest pain in September 2010   stent placed to his LAD Stable since that time atrial fibrillation following back surgery  spontaneously converted , previously declined anticoagulation Diabetes, lower extremity neuropathy. s/p back surgery for lumbar spinal stenosis and DJD  unable to tolerate aspirin, GI problems 20 years ago,  stopped the metoprolol on his own as he reported having fatigue.  seen by Dr. Grayland Ormond for low platelets, treated with prednisone Quit smoking in 1987, smoked 20 years Chronically low platelets, avg 60s to 70s presenting for routine follow-up of his coronary artery disease.  Hematuria 06/14/2018 eliquis was stopped, platelets at that time 79 On plavix alone today Back in atrial fibrillation on EKG Heart rate 101 with urology 03/12/2019  Recent platelets of 67  Active works in the garden, works in bursts then sits , back pain Denies SOB, no edema Has neuorpathy  Almost 10 months since stent as placed He does not want dual "blood thinners"  He has seen urology for hematuria Frequency x a lot, urgency, nocturia x 3-4   Total chol 69, LDL 23 HBA1C 6.4  chronic back pain,  Has been [facility], neurosurgery   EKG personally reviewed by myself on todays visit Shows atrial fibrillation with rate 92 bpm no significant ST or T wave changes  hospitalization  non-STEMI 06/07/2018 Cardiac catheterization, stent to the LAD Aspirin held given thrombocytopenia Not on beta-blocker as he has adequate rate control on calcium channel blocker for chronic atrial fibrillation Restarted on Eliquis  Cardiac catheterization  June 07, 2018 1. Severe 2-vessel coronary artery disease, including 90% mid LAD stenosis involving small D1 branch just beyond old stent and chronic total occlusion of the distal RCA.  Severe apical LAD disease is stable from prior catheterization. 2. Moderate, non-obstructive disease involving the LCx. 3. Patent proximal LAD stent with mild in-stent restenosis. 4. Mildly elevated left ventricular filling pressure. 5. Successful PCI to mid LAD using Resolute Onyx 2.5 x 18 mm drug-eluting stent with 0% residual stenosis and TIMI-3 flow.  Recommendations: 1. Medical therapy for chronic total occlusion of RCA and small diagonal branch.  Other past medical hx permanent Atrial fib since 2017. Previously declined anticoagulation Was taking eliquis until 06/2018, stopped for hematuria   emergency room August 25 2014 with chest pain. Cardiac enzymes were negative  cardiac catheterization 08/26/2014.  This showed an occluded proximal RCA which was chronic, mild to moderate proximal left circumflex, moderate distal left circumflex disease of a small vessel, moderate proximal OM disease moderate size vessel, LAD proximal stent patent, normal ejection fraction. Medical management was recommended.  Cardiac catheterization September 2000 and details a 75% proximal LAD lesion, 100% RCA lesion, 30% left circumflex lesion a xience 2.75 x 15 mm DES stent was placed.  PMH:   has a past medical history of Arthritis, BPH with obstruction/lower urinary tract symptoms, CAD (coronary artery disease) (2010), Diabetes mellitus, ED (erectile dysfunction), Elevated PSA, GERD (gastroesophageal reflux disease), Gross hematuria, H/O CT scan, Hematuria, History of hiatal hernia, History of kidney stones, HLD (hyperlipidemia), Hypertension, Ischemic heart disease (  1991), Kidney stone, Morbid obesity ([place]), Myocardial infarction [facility]), Peripheral neuropathy, Pernicious anemia, Pneumonia (1995), Pulmonary embolism ([place]),  Renal cyst, Sleep apnea, and Spinal stenosis.  PSH:    Past Surgical History:  Procedure Laterality Date  . APPENDECTOMY    . BACK SURGERY     x3 back surgeries - /w fusioin, 1- [place]  . bilateral inguinal hernia repair    . cad     stent 2010  . CARDIAC CATHETERIZATION  08/17/2014  . CARPAL TUNNEL RELEASE  2014   bilateral   . CHOLECYSTECTOMY    . CORONARY STENT INTERVENTION N/A 06/07/2018   Procedure: CORONARY STENT INTERVENTION;  Surgeon: Nelva Bush, MD;  Location: [facility];  Service: Cardiovascular;  Laterality: N/A;  . LAMINECTOMY    . LEFT HEART CATH AND CORONARY ANGIOGRAPHY N/A 06/07/2018   Procedure: LEFT HEART CATH AND CORONARY ANGIOGRAPHY;  Surgeon: Nelva Bush, MD;  Location: [facility];  Service: Cardiovascular;  Laterality: N/A;    Current Outpatient Medications  Medication Sig Dispense Refill  . acetaminophen (TYLENOL) 500 MG tablet Take 500 mg by mouth as needed.     Marland Kitchen atorvastatin (LIPITOR) 40 MG tablet TAKE 1 TABLET BY MOUTH EVERYDAY AT BEDTIME 90 tablet 3  . clopidogrel (PLAVIX) 75 MG tablet TAKE 1 TABLET (75 MG TOTAL) BY MOUTH DAILY WITH BREAKFAST. 90 tablet 1  . Coenzyme Q10 (CO Q-10) 200 MG CAPS Take 200 mg by mouth daily.    . Cyanocobalamin (VITAMIN B-12 IJ) Inject 1 vial into the muscle every 30 (thirty) days.     . diazepam (VALIUM) 5 MG tablet Take 5 mg by mouth 3 (three) times daily as needed for anxiety or muscle spasms.     Marland Kitchen diltiazem (CARDIZEM CD) 180 MG 24 hr capsule Take 180 mg by mouth daily before breakfast.     . feeding supplement, ENSURE ENLIVE, (ENSURE ENLIVE) LIQD Take 237 mLs by mouth daily. 60 Bottle 0  . finasteride (PROSCAR) 5 MG tablet Take 1 tablet (5 mg total) by mouth daily. 90 tablet 3  . glimepiride (AMARYL) 4 MG tablet Take 4 mg by mouth 2 (two) times daily.    . hydrochlorothiazide (HYDRODIURIL) 25 MG tablet Take 25 mg by mouth daily.    Marland Kitchen lisinopril (PRINIVIL,ZESTRIL) 10 MG tablet TAKE  ONE TABLET BY MOUTH ONCE DAILY (Patient taking differently: Take 10 mg by mouth daily. ) 90 tablet 2  . Multiple Vitamin (MULTIVITAMIN WITH MINERALS) TABS tablet Take 1 tablet by mouth daily. 30 tablet 0  . nystatin cream (MYCOSTATIN) Apply 1 application topically 2 (two) times daily. 30 g 0  . omega-3 acid ethyl esters (LOVAZA) 1 g capsule Take 2 g by mouth daily.    Marland Kitchen omeprazole (PRILOSEC) 20 MG capsule Take 20 mg by mouth 2 (two) times daily.     Marland Kitchen oxyCODONE-acetaminophen (PERCOCET) 10-325 MG tablet Take 1 tablet by mouth every 4 (four) hours as needed for pain.     . sildenafil (VIAGRA) 100 MG tablet Take 100 mg by mouth daily as needed for erectile dysfunction.     . simvastatin (ZOCOR) 40 MG tablet Take 40 mg by mouth daily.    . tamsulosin (FLOMAX) 0.4 MG CAPS capsule Take 1 capsule (0.4 mg total) by mouth daily. 90 capsule 3  . testosterone cypionate (DEPOTESTOSTERONE CYPIONATE) 200 MG/ML injection Inject 200 mg into the muscle every 28 (twenty-eight) days.      No current facility-administered medications for this  visit.      Allergies:   Amitriptyline, Lyrica [pregabalin], Metformin, Neurontin [gabapentin], Nitroglycerin, Novocain [procaine], and Procaine hcl   Social History:  The patient  reports that he quit smoking about 33 years ago. He has a 45.00 pack-year smoking history. He has quit using smokeless tobacco. He reports current alcohol use. He reports that he does not use drugs.   Family History:   family history includes Heart attack in his brother; Heart disease in his father; Heart failure in his mother; Skin cancer in his sister.    Review of Systems: Review of Systems  Constitutional: Positive for malaise/fatigue.       Weakness  Respiratory: Negative.   Cardiovascular: Negative.   Gastrointestinal: Negative.   Musculoskeletal: Positive for back pain.  Neurological: Negative.        Neuropathy lower extremities  Psychiatric/Behavioral: Negative.   All other  systems reviewed and are negative.   PHYSICAL EXAM: VS:  BP 130/80 (BP Location: Left Arm, Patient Position: Sitting, Cuff Size: Normal)   Pulse 92   Ht 6\' 2"  (1.88 m)   Wt 252 lb 12 oz (114.6 kg)   SpO2 98%   BMI 32.45 kg/m  , BMI Body mass index is 32.45 kg/m.  No significant change in exam Constitutional:  oriented to person, place, and time. No distress. Obese HENT:  Head: Normocephalic and atraumatic.  Eyes:  no discharge. No scleral icterus.  Neck: Normal range of motion. Neck supple. No JVD present.  Cardiovascular: irregularly irregular, normal heart sounds and intact distal pulses. Exam reveals no gallop and no friction rub. No edema No murmur heard. Pulmonary/Chest: Effort normal and breath sounds normal. No stridor. No respiratory distress.  no wheezes.  no rales.  no tenderness.  Abdominal: Soft.  no distension.  no tenderness.  Musculoskeletal: Normal range of motion.  no  tenderness or deformity.  Neurological: Grossly nonfocal Skin: Skin is warm and dry. No rash noted. not diaphoretic.  Psychiatric:  normal mood and affect. behavior is normal. Thought content normal.    Recent Labs: 06/05/2018: B Natriuretic Peptide 235.0 06/14/2018: ALT 18; BUN 20; Creatinine, Ser 0.96; Hemoglobin 14.6; Platelets 79; Potassium 4.1; Sodium 135    Lipid Panel Lab Results  Component Value Date   CHOL 69 06/08/2018   HDL 23 (L) 06/08/2018   LDLCALC 23 06/08/2018   TRIG 113 06/08/2018      Wt Readings from Last 3 Encounters:  03/14/19 252 lb 12 oz (114.6 kg)  03/12/19 260 lb (117.9 kg)  09/25/18 248 lb (112.5 kg)     ASSESSMENT AND PLAN:  Atrial fibrillation, unspecified type ([place]) - Plan: EKG 12-Lead Case discussed with Dr. Saunders Revel, He has been on Plavix 10 months, we will now hold Plavix start low-dose aspirin and restart Eliquis 5 twice daily  Mixed hyperlipidemia Cholesterol is at goal on the current lipid regimen. No changes to the medications were made.  Type 2  diabetes mellitus with other circulatory complication, without long-term current use of insulin ([place]) Numbers at goal Weight is stable  Thrombocytopenia ([place]) previously seen by hematology at [facility]. Numbers around 70.  We will need to monitor carefully on low-dose aspirin with Eliquis  Atherosclerosis of native coronary artery of native heart without angina pectoris Stent to his LAD  We will change to low-dose aspirin with Eliquis 5 twice daily, monitor for hematuria  Neuropathy Management per primary care Stable. Chronic pain   Total encounter time more than 45 minutes  Greater than  50% was spent in counseling and coordination of care with the patient   Disposition:   F/U 2 month   Orders Placed This Encounter  Procedures  . EKG 12-Lead     Signed, Esmond Plants, M.D., Ph.D. 03/14/2019  [place], [place]

## 2019-03-14 ENCOUNTER — Encounter: Payer: Self-pay | Admitting: Cardiovascular Disease

## 2019-03-14 ENCOUNTER — Telehealth: Payer: Self-pay | Admitting: Cardiovascular Disease

## 2019-03-14 ENCOUNTER — Other Ambulatory Visit: Payer: Self-pay

## 2019-03-14 ENCOUNTER — Ambulatory Visit: Payer: Medicare HMO | Admitting: Cardiovascular Disease

## 2019-03-14 VITALS — BP 130/80 | HR 92 | Ht 74.0 in | Wt 252.8 lb

## 2019-03-14 DIAGNOSIS — E1159 Type 2 diabetes mellitus with other circulatory complications: Secondary | ICD-10-CM

## 2019-03-14 DIAGNOSIS — I25118 Atherosclerotic heart disease of native coronary artery with other forms of angina pectoris: Secondary | ICD-10-CM | POA: Diagnosis not present

## 2019-03-14 DIAGNOSIS — I480 Paroxysmal atrial fibrillation: Secondary | ICD-10-CM

## 2019-03-14 DIAGNOSIS — E782 Mixed hyperlipidemia: Secondary | ICD-10-CM | POA: Diagnosis not present

## 2019-03-14 MED ORDER — APIXABAN 5 MG PO TABS: 5.0000 mg | ORAL_TABLET | Freq: Two times a day (BID) | ORAL | 3 refills | Status: DC

## 2019-03-14 MED ORDER — ASPIRIN EC 81 MG PO TBEC: 81.0000 mg | DELAYED_RELEASE_TABLET | Freq: Every day | ORAL | 3 refills | Status: DC

## 2019-03-14 NOTE — Telephone Encounter (Signed)
Left voicemail message for patient to call back regarding changes.

## 2019-03-14 NOTE — Patient Instructions (Addendum)
Medication Instructions:  Your physician has recommended you make the following change in your medication:  1. HOLD Plavix (Clopidigrel) 2. START Eliquis (Apixaban) 5 mg twice a day 3. START Aspirin 81 mg once daily   If you need a refill on your cardiac medications before your next appointment, please call your pharmacy.    Lab work: No new labs needed   If you have labs (blood work) drawn today and your tests are completely normal, you will receive your results only by: Marland Kitchen MyChart Message (if you have MyChart) OR . A paper copy in the mail If you have any lab test that is abnormal or we need to change your treatment, we will call you to review the results.   Testing/Procedures: No new testing needed   Follow-Up: At [facility], you and your health needs are our priority.  As part of our continuing mission to provide you with exceptional heart care, we have created designated Provider Care Teams.  These Care Teams include your primary Cardiologist (physician) and Advanced Practice Providers (APPs -  Physician Assistants and Nurse Practitioners) who all work together to provide you with the care you need, when you need it.  . You will need a follow up appointment in 2 month   . Providers on your designated Care Team:   . Murray Hodgkins, NP . Christell Faith, PA-C . Marrianne Mood, PA-C  Any Other Special Instructions Will Be Listed Below (If Applicable).  For educational health videos Log in to : [email or web address] Or : [email or web address], password : [email or web address]

## 2019-03-14 NOTE — Telephone Encounter (Signed)
Spoke with patients wife per release form and reviewed that Dr. Rockey Situ wanted him to discontinue the Plavix and start Eliquis 5 mg twice a day and start a baby aspirin 81 mg once a day. She reports that she will try to talk him into the aspirin because he reports some stomach irritation. Advised that I would mail out a new summary and to please have him call if he should have any questions. She verbalized understanding with no further questions.

## 2019-03-20 DIAGNOSIS — D485 Neoplasm of uncertain behavior of skin: Secondary | ICD-10-CM | POA: Diagnosis not present

## 2019-03-20 DIAGNOSIS — Z85828 Personal history of other malignant neoplasm of skin: Secondary | ICD-10-CM | POA: Diagnosis not present

## 2019-03-20 DIAGNOSIS — Z08 Encounter for follow-up examination after completed treatment for malignant neoplasm: Secondary | ICD-10-CM | POA: Diagnosis not present

## 2019-03-20 DIAGNOSIS — L821 Other seborrheic keratosis: Secondary | ICD-10-CM | POA: Diagnosis not present

## 2019-03-20 DIAGNOSIS — L570 Actinic keratosis: Secondary | ICD-10-CM | POA: Diagnosis not present

## 2019-03-22 ENCOUNTER — Other Ambulatory Visit: Payer: Self-pay | Admitting: Cardiovascular Disease

## 2019-03-24 DIAGNOSIS — R69 Illness, unspecified: Secondary | ICD-10-CM | POA: Diagnosis not present

## 2019-04-09 DIAGNOSIS — E538 Deficiency of other specified B group vitamins: Secondary | ICD-10-CM | POA: Diagnosis not present

## 2019-04-17 DIAGNOSIS — E119 Type 2 diabetes mellitus without complications: Secondary | ICD-10-CM | POA: Diagnosis not present

## 2019-04-19 DIAGNOSIS — X32XXXA Exposure to sunlight, initial encounter: Secondary | ICD-10-CM | POA: Diagnosis not present

## 2019-04-19 DIAGNOSIS — L570 Actinic keratosis: Secondary | ICD-10-CM | POA: Diagnosis not present

## 2019-04-24 DIAGNOSIS — Z87898 Personal history of other specified conditions: Secondary | ICD-10-CM | POA: Diagnosis not present

## 2019-04-24 DIAGNOSIS — G6289 Other specified polyneuropathies: Secondary | ICD-10-CM | POA: Diagnosis not present

## 2019-04-24 DIAGNOSIS — I10 Essential (primary) hypertension: Secondary | ICD-10-CM | POA: Diagnosis not present

## 2019-04-24 DIAGNOSIS — D693 Immune thrombocytopenic purpura: Secondary | ICD-10-CM | POA: Diagnosis not present

## 2019-04-24 DIAGNOSIS — E119 Type 2 diabetes mellitus without complications: Secondary | ICD-10-CM | POA: Diagnosis not present

## 2019-04-24 DIAGNOSIS — Z0001 Encounter for general adult medical examination with abnormal findings: Secondary | ICD-10-CM | POA: Diagnosis not present

## 2019-05-01 DIAGNOSIS — R1084 Generalized abdominal pain: Secondary | ICD-10-CM | POA: Diagnosis not present

## 2019-05-01 DIAGNOSIS — D649 Anemia, unspecified: Secondary | ICD-10-CM | POA: Diagnosis not present

## 2019-05-01 DIAGNOSIS — R1013 Epigastric pain: Secondary | ICD-10-CM | POA: Diagnosis not present

## 2019-05-01 DIAGNOSIS — Z86010 Personal history of colonic polyps: Secondary | ICD-10-CM | POA: Diagnosis not present

## 2019-05-01 DIAGNOSIS — R198 Other specified symptoms and signs involving the digestive system and abdomen: Secondary | ICD-10-CM | POA: Diagnosis not present

## 2019-05-01 DIAGNOSIS — R638 Other symptoms and signs concerning food and fluid intake: Secondary | ICD-10-CM | POA: Diagnosis not present

## 2019-05-04 DIAGNOSIS — R749 Abnormal serum enzyme level, unspecified: Secondary | ICD-10-CM | POA: Diagnosis not present

## 2019-05-10 DIAGNOSIS — R69 Illness, unspecified: Secondary | ICD-10-CM | POA: Diagnosis not present

## 2019-05-11 ENCOUNTER — Telehealth: Payer: Self-pay | Admitting: Cardiovascular Disease

## 2019-05-11 DIAGNOSIS — E538 Deficiency of other specified B group vitamins: Secondary | ICD-10-CM | POA: Diagnosis not present

## 2019-05-15 NOTE — Telephone Encounter (Signed)
° °  [facility] Pre-operative Risk Assessment    Request for surgical clearance:  1. What type of surgery is being performed? Colonoscopy and or upper endoscopy   2. When is this surgery scheduled? 07/11/19   3. What type of clearance is required (medical clearance vs. Pharmacy clearance to hold med vs. Both)? Both   4. Are there any medications that need to be held prior to surgery and how long? Eliquis x 3 days or please advise   5. Practice name and name of physician performing surgery?  [facility]  6. What is your office phone number [phone]    7.   What is your office fax number [phone]   8.   Anesthesia type (None, local, MAC, general) ? Monitored anesthesia    Clarisse Gouge 05/15/2019, 2:45 PM  _________________________________________________________________   (provider comments below)

## 2019-05-15 NOTE — Telephone Encounter (Signed)
Please comment on eliquis. 

## 2019-05-16 NOTE — Telephone Encounter (Signed)
Patient with diagnosis of A.FIB on ELIQUIS for anticoagulation.    Procedure: COLOSCOPY AND ENDOSCOPY Date of procedure: 07/11/2019  CHADS2-VASc score of 5 (HTN, AGE X 2, DM2, CAD)  CrCl > 100ML/MIN Platelet count = 79  Per office protocol, patient can hold ELIQUIS for 2 days prior to procedure.

## 2019-05-17 DIAGNOSIS — X32XXXA Exposure to sunlight, initial encounter: Secondary | ICD-10-CM | POA: Diagnosis not present

## 2019-05-17 DIAGNOSIS — L570 Actinic keratosis: Secondary | ICD-10-CM | POA: Diagnosis not present

## 2019-05-17 NOTE — Telephone Encounter (Signed)
   Primary Cardiologist: Ida Rogue, MD  Chart reviewed as part of pre-operative protocol coverage. Patient was contacted 05/17/2019 in reference to pre-operative risk assessment for pending surgery as outlined below.  Cody Price was last seen on 03/14/2019 by Dr. Rockey Situ.  Since that day, Cody Price has done fine from a cardiac standpoint. His main issues at this time are stomach pains and back pains. He is somewhat limited in activity by back pain but is able to walk on level ground and perform ADLs/household chores without chest pain or SOB. He can easily complete 4 METs without anginal complaints.  Therefore, based on ACC/AHA guidelines, the patient would be at acceptable risk for the planned procedure without further cardiovascular testing.   Per pharmacy recommendations, patient can hold eliquis 2 days prior to his upcoming endoscopic procedure. He should restart eliquis as soon as he is cleared to do so by his gastroenterologist.  I will route this recommendation to the requesting party via [facility] fax function and remove from pre-op pool.  Please call with questions.  Abigail Butts, PA-C 05/17/2019, 3:36 PM

## 2019-05-24 ENCOUNTER — Other Ambulatory Visit: Payer: Self-pay | Admitting: Gastroenterology

## 2019-05-24 DIAGNOSIS — R1900 Intra-abdominal and pelvic swelling, mass and lump, unspecified site: Secondary | ICD-10-CM | POA: Diagnosis not present

## 2019-05-24 DIAGNOSIS — R1084 Generalized abdominal pain: Secondary | ICD-10-CM | POA: Diagnosis not present

## 2019-05-24 DIAGNOSIS — R1032 Left lower quadrant pain: Secondary | ICD-10-CM | POA: Diagnosis not present

## 2019-05-30 ENCOUNTER — Other Ambulatory Visit: Payer: Self-pay

## 2019-05-30 ENCOUNTER — Ambulatory Visit
Admission: RE | Admit: 2019-05-30 | Discharge: 2019-05-30 | Disposition: A | Discharge status: Home / Self Care | Payer: Medicare HMO | Source: Ambulatory Visit | Attending: Gastroenterology | Admitting: Gastroenterology

## 2019-05-30 DIAGNOSIS — R1032 Left lower quadrant pain: Secondary | ICD-10-CM

## 2019-05-30 DIAGNOSIS — R161 Splenomegaly, not elsewhere classified: Secondary | ICD-10-CM | POA: Diagnosis not present

## 2019-05-30 DIAGNOSIS — R102 Pelvic and perineal pain: Secondary | ICD-10-CM | POA: Diagnosis not present

## 2019-05-30 MED ORDER — IOHEXOL 300 MG/ML  SOLN
100.0000 mL | Freq: Once | INTRAMUSCULAR | Status: AC | PRN
  Administered 2019-05-30: 100 mL via INTRAVENOUS

## 2019-05-31 NOTE — Progress Notes (Signed)
9:02 AM   Cody Price 1941-02-09 [id number]  Referring provider: Baxter Hire, MD [place],  [place] [address]  Chief Complaint  Patient presents with  . Benign Prostatic Hypertrophy    HPI: Cody Price is a 78 year old diabetic male with a history of a penile rash, BPH with LUTS and ED who presents today for follow up.    Penile rash Occurs occasionally.  Responds to Nystatin cream.    BPH WITH LUTS  (prostate and/or bladder) IPSS score: 9/1   PVR: 26  mL     Previous score: 9/5     Previous PVR: 26 mL  Major complaint(s):  None.  Patient denies any dysuria or suprapubic/flank pain.  Patient denies any fevers, chills, nausea or vomiting.   He did admit to having an episode of gross hematuria after he had his cardiac stents placed, but he was seen in the ED and it was attributed to his anticoagulant therapy.   He did undergo a hematuria work up in 2015 with CTU and cystoscopy.  Findings positive for right kidney cyst.  He has had a recent contrast CT in 05/2019 which the cyst in the right kidney.    IPSS    Row Name 06/04/19 1500         International Prostate Symptom Score   How often have you had the sensation of not emptying your bladder?  Not at All     How often have you had to urinate less than every two hours?  Less than half the time     How often have you found you stopped and started again several times when you urinated?  Less than 1 in 5 times     How often have you found it difficult to postpone urination?  More than half the time     How often have you had a weak urinary stream?  Not at All     How often have you had to strain to start urination?  Not at All     How many times did you typically get up at night to urinate?  2 Times     Total IPSS Score  9       Quality of Life due to urinary symptoms   If you were to spend the rest of your life with your urinary condition just the way it is now how would you feel about that?  Pleased         Score:  1-7 Mild 8-19 Moderate 20-35 Severe   Erectile dysfunction His previous SHIM was 4.  He has been having difficulty with erections for several years.   His major complaint is achieving an erection.  His libido is preserved.   His risk factors for ED are age, hyperlipidemia, diabetes, MI, anticoagulation therapy, hypertension, testosterone deficiency and BPH.  He denies any painful erections or curvatures with his erections.   He has tried PDE 5 inhibitors in the past, but they have been ineffective.  He is not interested in other treatment options at this time.     Testosterone deficiency Managed by his PCP with testosterone cypionate injections.  PMH: Past Medical History:  Diagnosis Date  . Arthritis    degenerative back   . BPH with obstruction/lower urinary tract symptoms   . CAD (coronary artery disease) 2010   stent  . Diabetes mellitus   . ED (erectile dysfunction)   . Elevated PSA   .  GERD (gastroesophageal reflux disease)   . Gross hematuria   . H/O CT scan    recent renal CT due to hematuria, cystoscopy - normal    . Hematuria   . History of hiatal hernia   . History of kidney stones   . HLD (hyperlipidemia)   . Hypertension   . Ischemic heart disease 1991   with angioplasty  . Kidney stone   . Morbid obesity ([place])   . Myocardial infarction ([place])   . Peripheral neuropathy   . Pernicious anemia   . Pneumonia 1995   /w PE,   . Pulmonary embolism ([place])    previous  . Renal cyst   . Sleep apnea    pt. denies sleep apnea, pt. states he has had 2 studies - last one 2011  . Spinal stenosis     Surgical History: Past Surgical History:  Procedure Laterality Date  . APPENDECTOMY    . BACK SURGERY     x3 back surgeries - /w fusioin, 1- [facility]  . bilateral inguinal hernia repair    . cad     stent 2010  . CARDIAC CATHETERIZATION  08/17/2014  . CARPAL TUNNEL RELEASE  2014   bilateral   . CHOLECYSTECTOMY    . CORONARY STENT  INTERVENTION N/A 06/07/2018   Procedure: CORONARY STENT INTERVENTION;  Surgeon: Nelva Bush, MD;  Location: [facility];  Service: Cardiovascular;  Laterality: N/A;  . LAMINECTOMY    . LEFT HEART CATH AND CORONARY ANGIOGRAPHY N/A 06/07/2018   Procedure: LEFT HEART CATH AND CORONARY ANGIOGRAPHY;  Surgeon: Nelva Bush, MD;  Location: [facility];  Service: Cardiovascular;  Laterality: N/A;    Home Medications:  Allergies as of 06/04/2019      Reactions   Amitriptyline    Dicyclomine    Other reaction(s): Abdominal Pain   Lyrica [pregabalin]    Metformin Nausea Only   Neurontin [gabapentin]    Nitroglycerin Other (See Comments)   Makes Blood pressure go to low.    Novocain [procaine]    Procaine Hcl Other (See Comments)   Passes out      Medication List       Accurate as of June 04, 2019 11:59 PM. If you have any questions, ask your nurse or doctor.        acetaminophen 500 MG tablet Commonly known as: TYLENOL Take 500 mg by mouth as needed.   apixaban 5 MG Tabs tablet Commonly known as: ELIQUIS Take 1 tablet (5 mg total) by mouth 2 (two) times daily.   aspirin EC 81 MG tablet Take 1 tablet (81 mg total) by mouth daily.   atorvastatin 40 MG tablet Commonly known as: LIPITOR TAKE 1 TABLET BY MOUTH EVERYDAY AT BEDTIME   Co Q-10 200 MG Caps Take 200 mg by mouth daily.   diltiazem 180 MG 24 hr capsule Commonly known as: CARDIZEM CD Take 180 mg by mouth daily before breakfast.   feeding supplement (ENSURE ENLIVE) Liqd Take 237 mLs by mouth daily.   finasteride 5 MG tablet Commonly known as: Proscar Take 1 tablet (5 mg total) by mouth daily.   glimepiride 4 MG tablet Commonly known as: AMARYL Take 4 mg by mouth 2 (two) times daily.   hydrochlorothiazide 25 MG tablet Commonly known as: HYDRODIURIL Take 25 mg by mouth daily.   Linzess 72 MCG capsule Generic drug: linaclotide TAKE 1 CAPSULE (72 MCG TOTAL) BY MOUTH ONCE DAILY    lisinopril  10 MG tablet Commonly known as: ZESTRIL TAKE ONE TABLET BY MOUTH ONCE DAILY   multivitamin with minerals Tabs tablet Take 1 tablet by mouth daily.   nystatin cream Commonly known as: MYCOSTATIN Apply 1 application topically 2 (two) times daily.   omega-3 acid ethyl esters 1 g capsule Commonly known as: LOVAZA Take 2 g by mouth daily.   omeprazole 20 MG capsule Commonly known as: PRILOSEC Take 20 mg by mouth 2 (two) times daily.   oxyCODONE-acetaminophen 10-325 MG tablet Commonly known as: PERCOCET Take 1 tablet by mouth every 4 (four) hours as needed for pain.   polyethylene glycol-electrolytes 420 g solution Commonly known as: NuLYTELY/GoLYTELY Take by mouth.   simvastatin 40 MG tablet Commonly known as: ZOCOR Take 40 mg by mouth daily.   tamsulosin 0.4 MG Caps capsule Commonly known as: FLOMAX Take 1 capsule (0.4 mg total) by mouth daily.   testosterone cypionate 200 MG/ML injection Commonly known as: DEPOTESTOSTERONE CYPIONATE Inject 200 mg into the muscle every 28 (twenty-eight) days.   Valium 5 MG tablet Generic drug: diazepam Take 5 mg by mouth 3 (three) times daily as needed for anxiety or muscle spasms.   Viagra 100 MG tablet Generic drug: sildenafil Take 100 mg by mouth daily as needed for erectile dysfunction.   VITAMIN B-12 IJ Inject 1 vial into the muscle every 30 (thirty) days.       Allergies:  Allergies  Allergen Reactions  . Amitriptyline   . Dicyclomine     Other reaction(s): Abdominal Pain  . Lyrica [Pregabalin]   . Metformin Nausea Only  . Neurontin [Gabapentin]   . Nitroglycerin Other (See Comments)    Makes Blood pressure go to low.   . Novocain [Procaine]   . Procaine Hcl Other (See Comments)    Passes out    Family History: Family History  Problem Relation Age of Onset  . Heart failure Mother   . Heart disease Father   . Heart attack Brother   . Skin cancer Sister   . Kidney disease Neg Hx   . Prostate  cancer Neg Hx   . Kidney cancer Neg Hx   . Bladder Cancer Neg Hx     Social History:  reports that he quit smoking about 33 years ago. He has a 45.00 pack-year smoking history. He has quit using smokeless tobacco. He reports current alcohol use. He reports that he does not use drugs.  ROS: UROLOGY Frequent Urination?: No Hard to postpone urination?: Yes Burning/pain with urination?: No Get up at night to urinate?: No Leakage of urine?: No Urine stream starts and stops?: No Trouble starting stream?: No Do you have to strain to urinate?: No Blood in urine?: No Urinary tract infection?: No Sexually transmitted disease?: No Injury to kidneys or bladder?: No Painful intercourse?: No Weak stream?: No Erection problems?: Yes Penile pain?: No  Gastrointestinal Nausea?: No Vomiting?: No Indigestion/heartburn?: No Diarrhea?: Yes Constipation?: Yes  Constitutional Fever: No Night sweats?: No Weight loss?: Yes Fatigue?: No  Skin Skin rash/lesions?: No Itching?: Yes  Eyes Blurred vision?: No Double vision?: No  Ears/Nose/Throat Sore throat?: No Sinus problems?: No  Hematologic/Lymphatic Swollen glands?: No Easy bruising?: No  Cardiovascular Leg swelling?: No Chest pain?: No  Respiratory Cough?: No Shortness of breath?: No  Endocrine Excessive thirst?: No  Musculoskeletal Back pain?: Yes Joint pain?: No  Neurological Headaches?: No Dizziness?: No  Psychologic Depression?: No Anxiety?: No  Physical Exam: BP 108/65 (BP Location: Left Arm, Patient Position: Sitting,  Cuff Size: Normal)   Pulse 86   Ht 6\' 2"  (1.88 m)   Wt 250 lb (113.4 kg)   BMI 32.10 kg/m   Constitutional:  Well nourished. Alert and oriented, No acute distress. HEENT: [place] AT, moist mucus membranes.  Trachea midline, no masses. Cardiovascular: No clubbing, cyanosis, or edema. Respiratory: Normal respiratory effort, no increased work of breathing. GI: Abdomen is soft, non tender,  non distended, no abdominal masses. Liver and spleen not palpable.  No hernias appreciated.  Stool sample for occult testing is not indicated.   GU: No CVA tenderness.  No bladder fullness or masses.  Patient with uncircumcised phallus.  Foreskin easily retracted.  Urethral meatus is patent.  No penile discharge. No penile lesions or rashes. Scrotum without lesions, cysts, rashes and/or edema.  Testicles are located scrotally bilaterally. No masses are appreciated in the testicles. Left and right epididymis are normal. Rectal: Patient with  normal sphincter tone. Anus and perineum without scarring or rashes. No rectal masses are appreciated. Prostate is approximately 60 grams, no nodules are appreciated. Seminal vesicles could not be palpated.   Skin: No rashes, bruises or suspicious lesions. Lymph: No inguinal adenopathy. Neurologic: Grossly intact, no focal deficits, moving all 4 extremities. Psychiatric: Normal mood and affect.  Laboratory Data: Urinalysis Component     Latest Ref Rng & Units 06/05/2019  Color, Urine     YELLOW AMBER (A)  Appearance     CLEAR CLEAR  Specific Gravity, Urine     1.005 - 1.030 1.020  pH     5.0 - 8.0 7.0  Glucose, UA     NEGATIVE mg/dL 100 (A)  Hgb urine dipstick     NEGATIVE NEGATIVE  Bilirubin Urine     NEGATIVE SMALL (A)  Ketones, ur     NEGATIVE mg/dL TRACE (A)  Protein     NEGATIVE mg/dL NEGATIVE  Nitrite     NEGATIVE NEGATIVE  Leukocytes,Ua     NEGATIVE NEGATIVE  Squamous Epithelial / LPF     0 - 5 0-5  WBC, UA     0 - 5 WBC/hpf 0-5  RBC / HPF     0 - 5 RBC/hpf 0-5  Bacteria, UA     NONE SEEN FEW (A)  Mucus      PRESENT   Lab Results  Component Value Date   WBC 8.7 06/14/2018   HGB 14.6 06/14/2018   HCT 40.9 06/14/2018   MCV 88.1 06/14/2018   PLT 79 (L) 06/14/2018   Lab Results  Component Value Date   CREATININE 0.96 06/14/2018   Lab Results  Component Value Date   AST 15 06/14/2018   Lab Results  Component Value  Date   ALT 18 06/14/2018   I have reviewed the labs.  Pertinent Imaging Results for LEXIE, SWENSON (MRN [id number]) as of 06/04/2019 15:22  Ref. Range 06/04/2019 15:17  Scan Result Unknown 26    Assessment & Plan:    1. Gross hematuria I explained to the patient that there are a number of causes that can be associated with blood in the urine, such as stones, UTI's, damage to the urinary tract and/or cancer. He had upper tract imaging in 05/2019 which did not identify any concerning GU findings Following the imaging study,  I've recommended a cystoscopy. I described how this is performed, typically in an office setting with a flexible cystoscope. We described the risks, benefits, and possible side effects, the most common of which is a  minor amount of blood in the urine and/or burning which usually resolves in 24 to 48 hours.  The patient had the opportunity to ask questions which were answered. Based upon this discussion, the patient is willing to proceed. Therefore, I've ordered a cystoscopy. RTC for cystoscopy for hematuria evaluation   2. Balanitis Controlled with the Nystatin cream  3. BPH with LUTS IPSS score is 9/1, it is stable  Continue conservative management, avoiding bladder irritants and timed voiding's No complaints at this visit.   Continue tamsulosin 0.4 mg daily and finasteride 5 mg daily - refills given  4. ED Not interested in sexual activity at this time   Return for schedule cystoscopy for hematuria .  These notes generated with voice recognition software. I apologize for typographical errors.  Zara Council, PA-C  [facility] [address] [place], [place] [address] [phone]

## 2019-06-04 ENCOUNTER — Encounter: Payer: Self-pay | Admitting: Urology

## 2019-06-04 ENCOUNTER — Other Ambulatory Visit: Payer: Self-pay

## 2019-06-04 ENCOUNTER — Ambulatory Visit: Payer: Medicare HMO | Admitting: Urology

## 2019-06-04 VITALS — BP 108/65 | HR 86 | Ht 74.0 in | Wt 250.0 lb

## 2019-06-04 DIAGNOSIS — N138 Other obstructive and reflux uropathy: Secondary | ICD-10-CM | POA: Diagnosis not present

## 2019-06-04 DIAGNOSIS — N401 Enlarged prostate with lower urinary tract symptoms: Secondary | ICD-10-CM | POA: Diagnosis not present

## 2019-06-04 DIAGNOSIS — R351 Nocturia: Secondary | ICD-10-CM | POA: Diagnosis not present

## 2019-06-04 LAB — BLADDER SCAN AMB NON-IMAGING: Scan Result: 26

## 2019-06-05 ENCOUNTER — Other Ambulatory Visit
Admission: RE | Admit: 2019-06-05 | Discharge: 2019-06-05 | Disposition: A | Discharge status: Home / Self Care | Payer: Medicare HMO | Source: Ambulatory Visit | Attending: Urology | Admitting: Urology

## 2019-06-05 ENCOUNTER — Ambulatory Visit: Payer: Medicare HMO | Admitting: Urology

## 2019-06-05 DIAGNOSIS — R351 Nocturia: Secondary | ICD-10-CM | POA: Diagnosis not present

## 2019-06-05 DIAGNOSIS — N138 Other obstructive and reflux uropathy: Secondary | ICD-10-CM | POA: Diagnosis not present

## 2019-06-05 DIAGNOSIS — N401 Enlarged prostate with lower urinary tract symptoms: Secondary | ICD-10-CM | POA: Diagnosis not present

## 2019-06-05 LAB — URINALYSIS, COMPLETE (UACMP) WITH MICROSCOPIC
Glucose, UA: 100 mg/dL — AB
Hgb urine dipstick: NEGATIVE
Leukocytes,Ua: NEGATIVE
Nitrite: NEGATIVE
Protein, ur: NEGATIVE mg/dL
Specific Gravity, Urine: 1.02 (ref 1.005–1.030)
pH: 7 (ref 5.0–8.0)

## 2019-06-07 ENCOUNTER — Telehealth: Payer: Self-pay | Admitting: Family Medicine

## 2019-06-07 DIAGNOSIS — H524 Presbyopia: Secondary | ICD-10-CM | POA: Diagnosis not present

## 2019-06-07 NOTE — Telephone Encounter (Signed)
-----   Message from Nori Riis, PA-C sent at 06/06/2019  3:10 PM EDT ----- I have spoken to Cody Price regarding the presence of the microscopic blood in his urine and with the gross hematuria he experienced a few months ago, I do recommend that he have a cystoscopy.  He would like to be scheduled in [place] office.

## 2019-06-08 NOTE — Telephone Encounter (Signed)
Appt made and confirmed

## 2019-06-12 DIAGNOSIS — E538 Deficiency of other specified B group vitamins: Secondary | ICD-10-CM | POA: Diagnosis not present

## 2019-06-12 DIAGNOSIS — Z23 Encounter for immunization: Secondary | ICD-10-CM | POA: Diagnosis not present

## 2019-06-26 ENCOUNTER — Other Ambulatory Visit: Payer: Self-pay

## 2019-06-26 DIAGNOSIS — N138 Other obstructive and reflux uropathy: Secondary | ICD-10-CM

## 2019-06-26 DIAGNOSIS — N401 Enlarged prostate with lower urinary tract symptoms: Secondary | ICD-10-CM

## 2019-06-27 DIAGNOSIS — R69 Illness, unspecified: Secondary | ICD-10-CM | POA: Diagnosis not present

## 2019-06-29 ENCOUNTER — Other Ambulatory Visit
Admission: RE | Admit: 2019-06-29 | Discharge: 2019-06-29 | Disposition: A | Discharge status: Home / Self Care | Payer: Medicare HMO | Attending: Urology | Admitting: Urology

## 2019-06-29 ENCOUNTER — Other Ambulatory Visit: Payer: Self-pay

## 2019-06-29 ENCOUNTER — Encounter: Payer: Self-pay | Admitting: Urology

## 2019-06-29 ENCOUNTER — Ambulatory Visit: Payer: Medicare HMO | Admitting: Urology

## 2019-06-29 VITALS — BP 136/71 | HR 84 | Ht 73.0 in | Wt 240.0 lb

## 2019-06-29 DIAGNOSIS — N138 Other obstructive and reflux uropathy: Secondary | ICD-10-CM | POA: Insufficient documentation

## 2019-06-29 DIAGNOSIS — N401 Enlarged prostate with lower urinary tract symptoms: Secondary | ICD-10-CM | POA: Diagnosis not present

## 2019-06-29 DIAGNOSIS — R310 Gross hematuria: Secondary | ICD-10-CM

## 2019-06-29 LAB — URINALYSIS, COMPLETE (UACMP) WITH MICROSCOPIC
Bacteria, UA: NONE SEEN
Bilirubin Urine: NEGATIVE
Glucose, UA: 100 mg/dL — AB
Hgb urine dipstick: NEGATIVE
Ketones, ur: NEGATIVE mg/dL
Leukocytes,Ua: NEGATIVE
Nitrite: NEGATIVE
Protein, ur: NEGATIVE mg/dL
RBC / HPF: NONE SEEN RBC/hpf (ref 0–5)
Specific Gravity, Urine: 1.025 (ref 1.005–1.030)
pH: 6 (ref 5.0–8.0)

## 2019-06-29 NOTE — Progress Notes (Signed)
   06/29/19  CC:  Chief Complaint  Patient presents with  . Cysto    HPI: 78 year old male with a personal history of gross hematuria at the time of cardiac cath who presents today for cystoscopic evaluation.  Notably, he has a personal history of BPH status post TURP.  He is followed by Zara Council.  See previous notes for details.  Most recent imaging in the form of CT abdomen pelvis with contrast on 05/30/2019 - for any GU pathology.  No delayed imaging on this study but he did have negative hematuria evaluation in 2015.  Blood pressure 136/71, pulse 84, height 6\' 1"  (1.854 m), weight 240 lb (108.9 kg). NED. A&Ox3.   No respiratory distress   Abd soft, NT, ND Normal phallus with bilateral descended testicles  Cystoscopy Procedure Note  Patient identification was confirmed, informed consent was obtained, and patient was prepped using Betadine solution.  Lidocaine jelly was administered per urethral meatus.     Pre-Procedure: - Inspection reveals a normal caliber ureteral meatus.  Procedure: The flexible cystoscope was introduced without difficulty - No urethral strictures/lesions are present. - Enlarged prostate with irregular fossa, prostatic regrowth and irregular TURP defect, friable prostatic mucosa - Normal bladder neck - Bilateral ureteral orifices identified - Bladder mucosa  reveals no ulcers, tumors, or lesions - No bladder stones - Moderate trabeculation  Retroflexion shows with hypervascularity of the bladder neck, otherwise unremarkable   Post-Procedure: - Patient tolerated the procedure well  Assessment/ Plan:  1. Gross hematuria Friable prostatic mucosa most likely underlying etiology of gross hematuria   Remainder of the bladder is unremarkable  Cross-sectional imaging shows no upper tract pathology, no delayed imaging however the likelihood of upper tract urothelial cancer is relatively low and has had previous negative evaluations.  Offered  CT urogram but declined.  2. BPH with obstruction/lower urinary tract symptoms Prostatic regrowth, prostamegaly appreciated on cystoscopy today Symptoms relatively stable on Flomax and finasteride, not interested in surgical intervention at this time   F/u [facility] in 3 months  Hollice Espy, MD

## 2019-07-06 ENCOUNTER — Other Ambulatory Visit: Payer: Self-pay

## 2019-07-06 ENCOUNTER — Other Ambulatory Visit
Admission: RE | Admit: 2019-07-06 | Discharge: 2019-07-06 | Disposition: A | Discharge status: Home / Self Care | Payer: Medicare HMO | Source: Ambulatory Visit | Attending: Internal Medicine | Admitting: Internal Medicine

## 2019-07-06 DIAGNOSIS — Z20828 Contact with and (suspected) exposure to other viral communicable diseases: Secondary | ICD-10-CM | POA: Insufficient documentation

## 2019-07-06 DIAGNOSIS — Z01812 Encounter for preprocedural laboratory examination: Secondary | ICD-10-CM | POA: Insufficient documentation

## 2019-07-06 LAB — SARS CORONAVIRUS 2 (TAT 6-24 HRS): SARS Coronavirus 2: NEGATIVE

## 2019-07-11 ENCOUNTER — Ambulatory Visit
Admission: RE | Admit: 2019-07-11 | Discharge: 2019-07-11 | Disposition: A | Discharge status: Home / Self Care | Payer: Medicare HMO | Attending: Internal Medicine | Admitting: Internal Medicine

## 2019-07-11 ENCOUNTER — Encounter: Payer: Self-pay | Admitting: *Deleted

## 2019-07-11 ENCOUNTER — Encounter
Admission: RE | Disposition: A | Discharge status: Home / Self Care | Payer: Self-pay | Source: Home / Self Care | Attending: Internal Medicine

## 2019-07-11 ENCOUNTER — Ambulatory Visit: Payer: Medicare HMO | Admitting: Anesthesiology

## 2019-07-11 DIAGNOSIS — G4730 Sleep apnea, unspecified: Secondary | ICD-10-CM | POA: Insufficient documentation

## 2019-07-11 DIAGNOSIS — Z7901 Long term (current) use of anticoagulants: Secondary | ICD-10-CM | POA: Insufficient documentation

## 2019-07-11 DIAGNOSIS — K589 Irritable bowel syndrome without diarrhea: Secondary | ICD-10-CM | POA: Diagnosis not present

## 2019-07-11 DIAGNOSIS — N138 Other obstructive and reflux uropathy: Secondary | ICD-10-CM | POA: Insufficient documentation

## 2019-07-11 DIAGNOSIS — M4800 Spinal stenosis, site unspecified: Secondary | ICD-10-CM | POA: Insufficient documentation

## 2019-07-11 DIAGNOSIS — K228 Other specified diseases of esophagus: Secondary | ICD-10-CM | POA: Diagnosis not present

## 2019-07-11 DIAGNOSIS — R1084 Generalized abdominal pain: Secondary | ICD-10-CM | POA: Diagnosis not present

## 2019-07-11 DIAGNOSIS — K2970 Gastritis, unspecified, without bleeding: Secondary | ICD-10-CM | POA: Diagnosis not present

## 2019-07-11 DIAGNOSIS — D124 Benign neoplasm of descending colon: Secondary | ICD-10-CM | POA: Diagnosis not present

## 2019-07-11 DIAGNOSIS — K317 Polyp of stomach and duodenum: Secondary | ICD-10-CM | POA: Diagnosis not present

## 2019-07-11 DIAGNOSIS — I10 Essential (primary) hypertension: Secondary | ICD-10-CM | POA: Diagnosis not present

## 2019-07-11 DIAGNOSIS — Z7984 Long term (current) use of oral hypoglycemic drugs: Secondary | ICD-10-CM | POA: Insufficient documentation

## 2019-07-11 DIAGNOSIS — K648 Other hemorrhoids: Secondary | ICD-10-CM | POA: Diagnosis not present

## 2019-07-11 DIAGNOSIS — Z888 Allergy status to other drugs, medicaments and biological substances status: Secondary | ICD-10-CM | POA: Insufficient documentation

## 2019-07-11 DIAGNOSIS — Z7982 Long term (current) use of aspirin: Secondary | ICD-10-CM | POA: Insufficient documentation

## 2019-07-11 DIAGNOSIS — K3189 Other diseases of stomach and duodenum: Secondary | ICD-10-CM | POA: Insufficient documentation

## 2019-07-11 DIAGNOSIS — E785 Hyperlipidemia, unspecified: Secondary | ICD-10-CM | POA: Diagnosis not present

## 2019-07-11 DIAGNOSIS — K2100 Gastro-esophageal reflux disease with esophagitis, without bleeding: Secondary | ICD-10-CM | POA: Diagnosis not present

## 2019-07-11 DIAGNOSIS — D123 Benign neoplasm of transverse colon: Secondary | ICD-10-CM | POA: Insufficient documentation

## 2019-07-11 DIAGNOSIS — G709 Myoneural disorder, unspecified: Secondary | ICD-10-CM | POA: Insufficient documentation

## 2019-07-11 DIAGNOSIS — Z955 Presence of coronary angioplasty implant and graft: Secondary | ICD-10-CM | POA: Insufficient documentation

## 2019-07-11 DIAGNOSIS — E1142 Type 2 diabetes mellitus with diabetic polyneuropathy: Secondary | ICD-10-CM | POA: Insufficient documentation

## 2019-07-11 DIAGNOSIS — Z884 Allergy status to anesthetic agent status: Secondary | ICD-10-CM | POA: Insufficient documentation

## 2019-07-11 DIAGNOSIS — K219 Gastro-esophageal reflux disease without esophagitis: Secondary | ICD-10-CM | POA: Insufficient documentation

## 2019-07-11 DIAGNOSIS — Z87442 Personal history of urinary calculi: Secondary | ICD-10-CM | POA: Insufficient documentation

## 2019-07-11 DIAGNOSIS — K5790 Diverticulosis of intestine, part unspecified, without perforation or abscess without bleeding: Secondary | ICD-10-CM | POA: Diagnosis not present

## 2019-07-11 DIAGNOSIS — Z86010 Personal history of colonic polyps: Secondary | ICD-10-CM | POA: Insufficient documentation

## 2019-07-11 DIAGNOSIS — Z86711 Personal history of pulmonary embolism: Secondary | ICD-10-CM | POA: Insufficient documentation

## 2019-07-11 DIAGNOSIS — K5730 Diverticulosis of large intestine without perforation or abscess without bleeding: Secondary | ICD-10-CM | POA: Insufficient documentation

## 2019-07-11 DIAGNOSIS — N401 Enlarged prostate with lower urinary tract symptoms: Secondary | ICD-10-CM | POA: Insufficient documentation

## 2019-07-11 DIAGNOSIS — K640 First degree hemorrhoids: Secondary | ICD-10-CM | POA: Diagnosis not present

## 2019-07-11 DIAGNOSIS — I252 Old myocardial infarction: Secondary | ICD-10-CM | POA: Diagnosis not present

## 2019-07-11 DIAGNOSIS — K449 Diaphragmatic hernia without obstruction or gangrene: Secondary | ICD-10-CM | POA: Diagnosis not present

## 2019-07-11 DIAGNOSIS — I25119 Atherosclerotic heart disease of native coronary artery with unspecified angina pectoris: Secondary | ICD-10-CM | POA: Insufficient documentation

## 2019-07-11 DIAGNOSIS — I4891 Unspecified atrial fibrillation: Secondary | ICD-10-CM | POA: Insufficient documentation

## 2019-07-11 DIAGNOSIS — Z6831 Body mass index (BMI) 31.0-31.9, adult: Secondary | ICD-10-CM | POA: Insufficient documentation

## 2019-07-11 DIAGNOSIS — R1013 Epigastric pain: Secondary | ICD-10-CM | POA: Diagnosis not present

## 2019-07-11 DIAGNOSIS — R194 Change in bowel habit: Secondary | ICD-10-CM | POA: Insufficient documentation

## 2019-07-11 DIAGNOSIS — Z87891 Personal history of nicotine dependence: Secondary | ICD-10-CM | POA: Insufficient documentation

## 2019-07-11 DIAGNOSIS — Z79899 Other long term (current) drug therapy: Secondary | ICD-10-CM | POA: Insufficient documentation

## 2019-07-11 DIAGNOSIS — M1990 Unspecified osteoarthritis, unspecified site: Secondary | ICD-10-CM | POA: Diagnosis not present

## 2019-07-11 DIAGNOSIS — K635 Polyp of colon: Secondary | ICD-10-CM | POA: Diagnosis not present

## 2019-07-11 DIAGNOSIS — D649 Anemia, unspecified: Secondary | ICD-10-CM | POA: Insufficient documentation

## 2019-07-11 HISTORY — PX: ESOPHAGOGASTRODUODENOSCOPY (EGD) WITH PROPOFOL: SHX5813

## 2019-07-11 HISTORY — PX: COLONOSCOPY WITH PROPOFOL: SHX5780

## 2019-07-11 LAB — GLUCOSE, CAPILLARY: Glucose-Capillary: 157 mg/dL — ABNORMAL HIGH (ref 70–99)

## 2019-07-11 SURGERY — ESOPHAGOGASTRODUODENOSCOPY (EGD) WITH PROPOFOL
Anesthesia: General

## 2019-07-11 MED ORDER — FENTANYL CITRATE (PF) 100 MCG/2ML IJ SOLN
INTRAMUSCULAR | Status: DC | PRN
  Administered 2019-07-11 (×4): 25 ug via INTRAVENOUS

## 2019-07-11 MED ORDER — MIDAZOLAM HCL 2 MG/2ML IJ SOLN
INTRAMUSCULAR | Status: AC
  Filled 2019-07-11: qty 2

## 2019-07-11 MED ORDER — PROPOFOL 10 MG/ML IV BOLUS
INTRAVENOUS | Status: DC | PRN
  Administered 2019-07-11: 30 mg via INTRAVENOUS

## 2019-07-11 MED ORDER — PHENYLEPHRINE HCL (PRESSORS) 10 MG/ML IV SOLN
INTRAVENOUS | Status: DC | PRN
  Administered 2019-07-11: 100 ug via INTRAVENOUS

## 2019-07-11 MED ORDER — PROPOFOL 500 MG/50ML IV EMUL
INTRAVENOUS | Status: DC | PRN
  Administered 2019-07-11: 50 ug/kg/min via INTRAVENOUS

## 2019-07-11 MED ORDER — MIDAZOLAM HCL 2 MG/2ML IJ SOLN
INTRAMUSCULAR | Status: DC | PRN
  Administered 2019-07-11 (×2): 1 mg via INTRAVENOUS

## 2019-07-11 MED ORDER — SODIUM CHLORIDE 0.9 % IV SOLN
INTRAVENOUS | Status: DC
  Administered 2019-07-11: 12:00:00 via INTRAVENOUS

## 2019-07-11 MED ORDER — FENTANYL CITRATE (PF) 100 MCG/2ML IJ SOLN
INTRAMUSCULAR | Status: AC
  Filled 2019-07-11: qty 2

## 2019-07-11 NOTE — Interval H&P Note (Signed)
History and Physical Interval Note:  07/11/2019 11:54 AM  Cody Price  has presented today for surgery, with the diagnosis of EPIGASTRIC PAIN,IBS,HX.OF COLON POLYPS.  The various methods of treatment have been discussed with the patient and family. After consideration of risks, benefits and other options for treatment, the patient has consented to  Procedure(s): ESOPHAGOGASTRODUODENOSCOPY (EGD) WITH PROPOFOL (N/A) COLONOSCOPY WITH PROPOFOL (N/A) as a surgical intervention.  The patient's history has been reviewed, patient examined, no change in status, stable for surgery.  I have reviewed the patient's chart and labs.  Questions were answered to the patient's satisfaction.     [place], [place]

## 2019-07-11 NOTE — Transfer of Care (Signed)
Immediate Anesthesia Transfer of Care Note  Patient: Cody Price  Procedure(s) Performed: ESOPHAGOGASTRODUODENOSCOPY (EGD) WITH PROPOFOL (N/A ) COLONOSCOPY WITH PROPOFOL (N/A )  Patient Location: PACU  Anesthesia Type:General  Level of Consciousness: awake, alert  and oriented  Airway & Oxygen Therapy: Patient Spontanous Breathing and Patient connected to nasal cannula oxygen  Post-op Assessment: Report given to RN, Post -op Vital signs reviewed and stable and Patient moving all extremities  Post vital signs: Reviewed and stable  Last Vitals:  Vitals Value Taken Time  BP    Temp    Pulse 90 07/11/19 1237  Resp 18 07/11/19 1237  SpO2 99 % 07/11/19 1237  Vitals shown include unvalidated device data.  Last Pain:  Vitals:   07/11/19 1127  TempSrc: Temporal  PainSc: 0-No pain         Complications: No apparent anesthesia complications

## 2019-07-11 NOTE — Anesthesia Postprocedure Evaluation (Signed)
Anesthesia Post Note  Patient: Cody Price  Procedure(s) Performed: ESOPHAGOGASTRODUODENOSCOPY (EGD) WITH PROPOFOL (N/A ) COLONOSCOPY WITH PROPOFOL (N/A )  Patient location during evaluation: Endoscopy Anesthesia Type: General Level of consciousness: awake and alert and oriented Pain management: pain level controlled Vital Signs Assessment: post-procedure vital signs reviewed and stable Respiratory status: spontaneous breathing Cardiovascular status: blood pressure returned to baseline Anesthetic complications: no     Last Vitals:  Vitals:   07/11/19 1246 07/11/19 1256  BP: 122/70 121/70  Pulse: 87 76  Resp: (!) 24 16  Temp:    SpO2: 97% 98%    Last Pain:  Vitals:   07/11/19 1256  TempSrc:   PainSc: 0-No pain                 [place]hire Tygart

## 2019-07-11 NOTE — H&P (Signed)
Outpatient short stay form Pre-procedure 07/11/2019 10:12 AM Cody Price, M.D.  Primary Physician:  Harrel Lemon, M.D.  Reason for visit:  Change in bowel habits, generalized abdominal pain.  History of present illness: As above. Patient with several months of progressive mild to moderate abdominal pain with essentially negative abdominal CT scan. Patient denies weight loss or gross gastrointestinal bleeding.    No current facility-administered medications for this encounter.   Current Outpatient Medications:  .  dicyclomine (BENTYL) 10 MG capsule, Take 10 mg by mouth 4 (four) times daily -  before meals and at bedtime., Disp: , Rfl:  .  acetaminophen (TYLENOL) 500 MG tablet, Take 500 mg by mouth as needed. , Disp: , Rfl:  .  apixaban (ELIQUIS) 5 MG TABS tablet, Take 1 tablet (5 mg total) by mouth 2 (two) times daily., Disp: 180 tablet, Rfl: 3 .  aspirin EC 81 MG tablet, Take 1 tablet (81 mg total) by mouth daily., Disp: 90 tablet, Rfl: 3 .  atorvastatin (LIPITOR) 40 MG tablet, TAKE 1 TABLET BY MOUTH EVERYDAY AT BEDTIME, Disp: 90 tablet, Rfl: 3 .  Coenzyme Q10 (CO Q-10) 200 MG CAPS, Take 200 mg by mouth daily., Disp: , Rfl:  .  Cyanocobalamin (VITAMIN B-12 IJ), Inject 1 vial into the muscle every 30 (thirty) days. , Disp: , Rfl:  .  diazepam (VALIUM) 5 MG tablet, Take 5 mg by mouth 3 (three) times daily as needed for anxiety or muscle spasms. , Disp: , Rfl:  .  diltiazem (CARDIZEM CD) 180 MG 24 hr capsule, Take 180 mg by mouth daily before breakfast. , Disp: , Rfl:  .  feeding supplement, ENSURE ENLIVE, (ENSURE ENLIVE) LIQD, Take 237 mLs by mouth daily., Disp: 60 Bottle, Rfl: 0 .  finasteride (PROSCAR) 5 MG tablet, Take 1 tablet (5 mg total) by mouth daily., Disp: 90 tablet, Rfl: 3 .  glimepiride (AMARYL) 4 MG tablet, Take 4 mg by mouth 2 (two) times daily., Disp: , Rfl:  .  hydrochlorothiazide (HYDRODIURIL) 25 MG tablet, Take 25 mg by mouth daily., Disp: , Rfl:  .  LINZESS 72 MCG  capsule, TAKE 1 CAPSULE (72 MCG TOTAL) BY MOUTH ONCE DAILY, Disp: , Rfl:  .  lisinopril (PRINIVIL,ZESTRIL) 10 MG tablet, TAKE ONE TABLET BY MOUTH ONCE DAILY (Patient taking differently: Take 10 mg by mouth daily. ), Disp: 90 tablet, Rfl: 2 .  Multiple Vitamin (MULTIVITAMIN WITH MINERALS) TABS tablet, Take 1 tablet by mouth daily., Disp: 30 tablet, Rfl: 0 .  nystatin cream (MYCOSTATIN), Apply 1 application topically 2 (two) times daily., Disp: 30 g, Rfl: 0 .  omega-3 acid ethyl esters (LOVAZA) 1 g capsule, Take 2 g by mouth daily., Disp: , Rfl:  .  omeprazole (PRILOSEC) 20 MG capsule, Take 20 mg by mouth 2 (two) times daily. , Disp: , Rfl:  .  omeprazole (PRILOSEC) 40 MG capsule, Take 40 mg by mouth 2 (two) times daily., Disp: , Rfl:  .  oxyCODONE-acetaminophen (PERCOCET) 10-325 MG tablet, Take 1 tablet by mouth every 4 (four) hours as needed for pain. , Disp: , Rfl:  .  polyethylene glycol-electrolytes (NULYTELY/GOLYTELY) 420 g solution, Take by mouth., Disp: , Rfl:  .  sildenafil (VIAGRA) 100 MG tablet, Take 100 mg by mouth daily as needed for erectile dysfunction. , Disp: , Rfl:  .  simvastatin (ZOCOR) 40 MG tablet, Take 40 mg by mouth daily., Disp: , Rfl:  .  sucralfate (CARAFATE) 1 g tablet, Take 1 g by  mouth 4 (four) times daily., Disp: , Rfl:  .  tamsulosin (FLOMAX) 0.4 MG CAPS capsule, Take 1 capsule (0.4 mg total) by mouth daily., Disp: 90 capsule, Rfl: 3 .  testosterone cypionate (DEPOTESTOSTERONE CYPIONATE) 200 MG/ML injection, Inject 200 mg into the muscle every 28 (twenty-eight) days. , Disp: , Rfl:   No medications prior to admission.     Allergies  Allergen Reactions  . Amitriptyline   . Dicyclomine     Other reaction(s): Abdominal Pain  . Lyrica [Pregabalin]   . Metformin Nausea Only  . Neurontin [Gabapentin]   . Nitroglycerin Other (See Comments)    Makes Blood pressure go to low.   . Novocain [Procaine]   . Procaine Hcl Other (See Comments)    Passes out     Past  Medical History:  Diagnosis Date  . Arthritis    degenerative back   . BPH with obstruction/lower urinary tract symptoms   . CAD (coronary artery disease) 2010   stent  . Diabetes mellitus   . ED (erectile dysfunction)   . Elevated PSA   . GERD (gastroesophageal reflux disease)   . Gross hematuria   . H/O CT scan    recent renal CT due to hematuria, cystoscopy - normal    . Hematuria   . History of hiatal hernia   . History of kidney stones   . HLD (hyperlipidemia)   . Hypertension   . Ischemic heart disease 1991   with angioplasty  . Kidney stone   . Morbid obesity ([place])   . Myocardial infarction ([place])   . Peripheral neuropathy   . Pernicious anemia   . Pneumonia 1995   /w PE,   . Pulmonary embolism ([place])    previous  . Renal cyst   . Sleep apnea    pt. denies sleep apnea, pt. states he has had 2 studies - last one 2011  . Spinal stenosis     Review of systems:  Otherwise negative.    Physical Exam  Gen: Alert, oriented. Appears stated age.  HEENT: [place]/AT. PERRLA. Lungs: CTA, no wheezes. CV: RR nl S1, S2. Abd: soft, benign, no masses. BS+ Ext: No edema. Pulses 2+    Planned procedures: Proceed with EGD and colonoscopy. The patient understands the nature of the planned procedure, indications, risks, alternatives and potential complications including but not limited to bleeding, infection, perforation, damage to internal organs and possible oversedation/side effects from anesthesia. The patient agrees and gives consent to proceed.  Please refer to procedure notes for findings, recommendations and patient disposition/instructions.     Cody Price, M.D. Gastroenterology 07/11/2019  10:12 AM

## 2019-07-11 NOTE — Anesthesia Preprocedure Evaluation (Addendum)
Anesthesia Evaluation  Patient identified by MRN, date of birth, ID band Patient awake    Reviewed: Allergy & Precautions, NPO status , Patient's Chart, lab work & pertinent test results  Airway Mallampati: III       Dental   Pulmonary sleep apnea , former smoker,    Pulmonary exam normal        Cardiovascular hypertension, + angina + CAD and + Past MI  + dysrhythmias Atrial Fibrillation      Neuro/Psych  Neuromuscular disease negative psych ROS   GI/Hepatic hiatal hernia, GERD  ,  Endo/Other  diabetes  Renal/GU      Musculoskeletal  (+) Arthritis , Osteoarthritis,    Abdominal Normal abdominal exam  (+)   Peds negative pediatric ROS (+)  Hematology  (+) Blood dyscrasia, anemia ,   Anesthesia Other Findings Past Medical History: No date: Arthritis     Comment:  degenerative back  No date: BPH with obstruction/lower urinary tract symptoms 12/21/2008: CAD (coronary artery disease)     Comment:  stent No date: Diabetes mellitus No date: ED (erectile dysfunction) No date: Elevated PSA No date: GERD (gastroesophageal reflux disease) No date: Gross hematuria No date: H/O CT scan     Comment:  recent renal CT due to hematuria, cystoscopy - normal   No date: Hematuria No date: History of hiatal hernia No date: History of kidney stones No date: HLD (hyperlipidemia) No date: Hypertension 1991: Ischemic heart disease     Comment:  with angioplasty No date: Kidney stone No date: Morbid obesity ([facility]) No date: Myocardial infarction ([place]) No date: Peripheral neuropathy No date: Pernicious anemia 1995: Pneumonia     Comment:  /w PE,  No date: Pulmonary embolism ([facility])     Comment:  previous No date: Renal cyst No date: Sleep apnea     Comment:  pt. denies sleep apnea, pt. states he has had 2 studies               - last one 12/21/09 No date: Spinal stenosis  Reproductive/Obstetrics                             Anesthesia Physical Anesthesia Plan  ASA: III  Anesthesia Plan: General   Post-op Pain Management:    Induction: Intravenous  PONV Risk Score and Plan:   Airway Management Planned: Nasal Cannula  Additional Equipment:   Intra-op Plan:   Post-operative Plan:   Informed Consent: I have reviewed the patients History and Physical, chart, labs and discussed the procedure including the risks, benefits and alternatives for the proposed anesthesia with the patient or authorized representative who has indicated his/her understanding and acceptance.     Dental advisory given  Plan Discussed with: CRNA and Surgeon  Anesthesia Plan Comments:         Anesthesia Quick Evaluation

## 2019-07-11 NOTE — Op Note (Signed)
[facility] Gastroenterology Patient Name: Cody Price Procedure Date: 07/11/2019 10:58 AM MRN: [id number] Account #: [id number] Date of Birth: 14-Apr-1941 Admit Type: Outpatient Age: 78 Room: [facility] ENDO ROOM 3 Gender: Male Note Status: Finalized Procedure:             Upper GI endoscopy Indications:           Abdominal pain in the left lower quadrant, Generalized                         abdominal pain Providers:             Benay Pike. Kanon Novosel MD, MD Medicines:             Propofol per Anesthesia Complications:         No immediate complications. Procedure:             Pre-Anesthesia Assessment:                        - The risks and benefits of the procedure and the                         sedation options and risks were discussed with the                         patient. All questions were answered and informed                         consent was obtained.                        - Patient identification and proposed procedure were                         verified prior to the procedure by the nurse. The                         procedure was verified in the procedure room.                        - ASA Grade Assessment: III - A patient with severe                         systemic disease.                        - After reviewing the risks and benefits, the patient                         was deemed in satisfactory condition to undergo the                         procedure.                        After obtaining informed consent, the endoscope was                         passed under direct vision. Throughout the procedure,  the patient's blood pressure, pulse, and oxygen                         saturations were monitored continuously. The Endoscope                         was introduced through the mouth, and advanced to the                         third part of duodenum. The upper GI endoscopy was                         accomplished without  difficulty. The patient tolerated                         the procedure well. Findings:      The Z-line was irregular and was found 37 to 38 cm from the incisors.       Mucosa was biopsied with a cold forceps for histology. One specimen       bottle was sent to pathology.      Patchy moderate inflammation characterized by erythema was found in the       gastric antrum. Biopsies were taken with a cold forceps for Helicobacter       pylori testing.      A single 6 mm semi-sessile polyp with no bleeding and stigmata of recent       bleeding was found in the cardia. Biopsies were taken with a cold       forceps for histology. Biopsies were taken with a cold forceps for       histology.      The examined duodenum was normal. Impression:            - Z-line irregular, 37 to 38 cm from the incisors.                         Biopsied.                        - Gastritis. Biopsied.                        - A single gastric polyp. Biopsied.                        - Normal examined duodenum. Recommendation:        - Await pathology results.                        - Proceed with colonoscopy Procedure Code(s):     --- Professional ---                        [phone], Esophagogastroduodenoscopy, flexible,                         transoral; with biopsy, single or multiple Diagnosis Code(s):     --- Professional ---                        R10.84, Generalized abdominal pain  R10.32, Left lower quadrant pain                        K31.7, Polyp of stomach and duodenum                        K29.70, Gastritis, unspecified, without bleeding                        K22.8, Other specified diseases of esophagus CPT copyright 2019 American Medical Association. All rights reserved. The codes documented in this report are preliminary and upon coder review may  be revised to meet current compliance requirements. Efrain Sella MD, MD 07/11/2019 12:17:52 PM This report has been signed  electronically. Number of Addenda: 0 Note Initiated On: 07/11/2019 10:58 AM Estimated Blood Loss:  Estimated blood loss: none.      [facility]

## 2019-07-11 NOTE — Op Note (Signed)
[facility] Gastroenterology Patient Name: Cody Price Procedure Date: 07/11/2019 10:57 AM MRN: [id number] Account #: [id number] Date of Birth: [birth year] Admit Type: Outpatient Age: 78 Room: [facility] ENDO ROOM 3 Gender: Male Note Status: Finalized Procedure:             Colonoscopy Indications:           Change in bowel habits Providers:             Benay Pike. Keyunna Coco MD, MD Medicines:             Propofol per Anesthesia Complications:         No immediate complications. Procedure:             Pre-Anesthesia Assessment:                        - The risks and benefits of the procedure and the                         sedation options and risks were discussed with the                         patient. All questions were answered and informed                         consent was obtained.                        - Patient identification and proposed procedure were                         verified prior to the procedure by the nurse. The                         procedure was verified in the procedure room.                        - ASA Grade Assessment: III - A patient with severe                         systemic disease.                        - After reviewing the risks and benefits, the patient                         was deemed in satisfactory condition to undergo the                         procedure.                        After obtaining informed consent, the colonoscope was                         passed under direct vision. Throughout the procedure,                         the patient's blood pressure, pulse, and oxygen  saturations were monitored continuously. The                         Colonoscope was introduced through the anus and                         advanced to the the cecum, identified by appendiceal                         orifice and ileocecal valve. The colonoscopy was                         performed without difficulty. The  patient tolerated                         the procedure well. The quality of the bowel                         preparation was adequate. The ileocecal valve,                         appendiceal orifice, and rectum were photographed. Findings:      The perianal and digital rectal examinations were normal. Pertinent       negatives include normal sphincter tone and no palpable rectal lesions.      Many small-mouthed diverticula were found in the sigmoid colon.      Two sessile polyps were found in the descending colon and transverse       colon. The polyps were 3 to 4 mm in size. These polyps were removed with       a jumbo cold forceps. Resection and retrieval were complete.      Non-bleeding internal hemorrhoids were found during retroflexion. The       hemorrhoids were Grade I (internal hemorrhoids that do not prolapse).      The exam was otherwise without abnormality. Impression:            - Diverticulosis in the sigmoid colon.                        - Two 3 to 4 mm polyps in the descending colon and in                         the transverse colon, removed with a jumbo cold                         forceps. Resected and retrieved.                        - Non-bleeding internal hemorrhoids.                        - The examination was otherwise normal. Recommendation:        - Await pathology results from EGD, also performed                         today.                        - Patient has a contact number available  for                         emergencies. The signs and symptoms of potential                         delayed complications were discussed with the patient.                         Return to normal activities tomorrow. Written                         discharge instructions were provided to the patient.                        - Resume previous diet.                        - Continue present medications.                        - Await pathology results.                         - If polyps are benign or adenomatous without                         dysplasia, I will advise NO further colonoscopy due to                         advanced age and/or severe comorbidity.                        - Return to physician assistant in 3 months.                        - The findings and recommendations were discussed with                         the patient. Procedure Code(s):     --- Professional ---                        [phone], Colonoscopy, flexible; with biopsy, single or                         multiple Diagnosis Code(s):     --- Professional ---                        K57.30, Diverticulosis of large intestine without                         perforation or abscess without bleeding                        R19.4, Change in bowel habit                        K63.5, Polyp of colon                        K64.0, First degree hemorrhoids  CPT copyright 2019 American Medical Association. All rights reserved. The codes documented in this report are preliminary and upon coder review may  be revised to meet current compliance requirements. Efrain Sella MD, MD 07/11/2019 12:35:38 PM This report has been signed electronically. Number of Addenda: 0 Note Initiated On: 07/11/2019 10:57 AM Scope Withdrawal Time: 0 hours 6 minutes 24 seconds  Total Procedure Duration: 0 hours 11 minutes 6 seconds  Estimated Blood Loss:  Estimated blood loss: none.      [facility]

## 2019-07-11 NOTE — Anesthesia Post-op Follow-up Note (Signed)
Anesthesia QCDR form completed.        

## 2019-07-12 ENCOUNTER — Encounter: Payer: Self-pay | Admitting: Internal Medicine

## 2019-07-12 LAB — SURGICAL PATHOLOGY

## 2019-07-13 DIAGNOSIS — E538 Deficiency of other specified B group vitamins: Secondary | ICD-10-CM | POA: Diagnosis not present

## 2019-07-15 ENCOUNTER — Other Ambulatory Visit: Payer: Self-pay | Admitting: Cardiovascular Disease

## 2019-08-13 DIAGNOSIS — E538 Deficiency of other specified B group vitamins: Secondary | ICD-10-CM | POA: Diagnosis not present

## 2019-08-15 DIAGNOSIS — R69 Illness, unspecified: Secondary | ICD-10-CM | POA: Diagnosis not present

## 2019-08-21 DIAGNOSIS — E119 Type 2 diabetes mellitus without complications: Secondary | ICD-10-CM | POA: Diagnosis not present

## 2019-08-28 DIAGNOSIS — I10 Essential (primary) hypertension: Secondary | ICD-10-CM | POA: Diagnosis not present

## 2019-08-28 DIAGNOSIS — M4807 Spinal stenosis, lumbosacral region: Secondary | ICD-10-CM | POA: Diagnosis not present

## 2019-08-28 DIAGNOSIS — E119 Type 2 diabetes mellitus without complications: Secondary | ICD-10-CM | POA: Diagnosis not present

## 2019-08-28 DIAGNOSIS — G8929 Other chronic pain: Secondary | ICD-10-CM | POA: Diagnosis not present

## 2019-08-28 DIAGNOSIS — Z87891 Personal history of nicotine dependence: Secondary | ICD-10-CM | POA: Diagnosis not present

## 2019-08-28 DIAGNOSIS — I259 Chronic ischemic heart disease, unspecified: Secondary | ICD-10-CM | POA: Diagnosis not present

## 2019-08-28 DIAGNOSIS — D693 Immune thrombocytopenic purpura: Secondary | ICD-10-CM | POA: Diagnosis not present

## 2019-09-13 DIAGNOSIS — E538 Deficiency of other specified B group vitamins: Secondary | ICD-10-CM | POA: Diagnosis not present

## 2019-09-15 DIAGNOSIS — E1165 Type 2 diabetes mellitus with hyperglycemia: Secondary | ICD-10-CM | POA: Diagnosis not present

## 2019-09-15 DIAGNOSIS — E785 Hyperlipidemia, unspecified: Secondary | ICD-10-CM | POA: Diagnosis not present

## 2019-09-15 DIAGNOSIS — E1151 Type 2 diabetes mellitus with diabetic peripheral angiopathy without gangrene: Secondary | ICD-10-CM | POA: Diagnosis not present

## 2019-09-15 DIAGNOSIS — D6869 Other thrombophilia: Secondary | ICD-10-CM | POA: Diagnosis not present

## 2019-09-15 DIAGNOSIS — E291 Testicular hypofunction: Secondary | ICD-10-CM | POA: Diagnosis not present

## 2019-09-15 DIAGNOSIS — I4891 Unspecified atrial fibrillation: Secondary | ICD-10-CM | POA: Diagnosis not present

## 2019-09-15 DIAGNOSIS — Z008 Encounter for other general examination: Secondary | ICD-10-CM | POA: Diagnosis not present

## 2019-09-15 DIAGNOSIS — G8929 Other chronic pain: Secondary | ICD-10-CM | POA: Diagnosis not present

## 2019-09-15 DIAGNOSIS — R69 Illness, unspecified: Secondary | ICD-10-CM | POA: Diagnosis not present

## 2019-09-15 DIAGNOSIS — E669 Obesity, unspecified: Secondary | ICD-10-CM | POA: Diagnosis not present

## 2019-09-15 DIAGNOSIS — E1140 Type 2 diabetes mellitus with diabetic neuropathy, unspecified: Secondary | ICD-10-CM | POA: Diagnosis not present

## 2019-09-17 ENCOUNTER — Other Ambulatory Visit: Payer: Self-pay | Admitting: Cardiovascular Disease

## 2019-10-03 DIAGNOSIS — R69 Illness, unspecified: Secondary | ICD-10-CM | POA: Diagnosis not present

## 2019-10-15 ENCOUNTER — Other Ambulatory Visit: Payer: Self-pay | Admitting: Cardiovascular Disease

## 2019-10-15 DIAGNOSIS — K2100 Gastro-esophageal reflux disease with esophagitis, without bleeding: Secondary | ICD-10-CM | POA: Diagnosis not present

## 2019-10-15 DIAGNOSIS — K581 Irritable bowel syndrome with constipation: Secondary | ICD-10-CM | POA: Diagnosis not present

## 2019-10-15 NOTE — Telephone Encounter (Signed)
Please schedule overdue F/U with Dr. Gollan. Thank you! 

## 2019-10-16 DIAGNOSIS — E538 Deficiency of other specified B group vitamins: Secondary | ICD-10-CM | POA: Diagnosis not present

## 2019-10-17 ENCOUNTER — Other Ambulatory Visit: Payer: Self-pay | Admitting: Cardiovascular Disease

## 2019-10-18 NOTE — Progress Notes (Signed)
Office Visit    Patient Name: [name] Date of Encounter: 10/19/2019  Primary Care Provider:  Baxter Hire, MD Primary Cardiologist:  Ida Rogue, MD Electrophysiologist:  None   Chief Complaint    Cody Price is a 79 y.o. male with a hx of CAD, atrial fibrillation, DM2, back surgery, thrombocytopenia, previous tobacco abuse presents today for follow up of CAD and atrial fibrillation.   Past Medical History    Past Medical History:  Diagnosis Date  . Arthritis    degenerative back   . BPH with obstruction/lower urinary tract symptoms   . CAD (coronary artery disease) 2010   stent  . Diabetes mellitus   . ED (erectile dysfunction)   . Elevated PSA   . GERD (gastroesophageal reflux disease)   . Gross hematuria   . H/O CT scan    recent renal CT due to hematuria, cystoscopy - normal    . Hematuria   . History of hiatal hernia   . History of kidney stones   . HLD (hyperlipidemia)   . Hypertension   . Ischemic heart disease 1991   with angioplasty  . Kidney stone   . Morbid obesity ([place])   . Myocardial infarction ([place])   . Peripheral neuropathy   . Pernicious anemia   . Pneumonia 1995   /w PE,   . Pulmonary embolism ([place])    previous  . Renal cyst   . Sleep apnea    pt. denies sleep apnea, pt. states he has had 2 studies - last one 2011  . Spinal stenosis    Past Surgical History:  Procedure Laterality Date  . APPENDECTOMY    . BACK SURGERY     x3 back surgeries - /w fusioin, 1- [place]  . bilateral inguinal hernia repair    . cad     stent 2010  . CARDIAC CATHETERIZATION  08/17/2014  . CARPAL TUNNEL RELEASE  2014   bilateral   . CHOLECYSTECTOMY    . COLONOSCOPY WITH PROPOFOL N/A 07/11/2019   Procedure: COLONOSCOPY WITH PROPOFOL;  Surgeon: Toledo, Benay Pike, MD;  Location: [facility];  Service: Gastroenterology;  Laterality: N/A;  . CORONARY STENT INTERVENTION N/A 06/07/2018   Procedure: CORONARY STENT INTERVENTION;   Surgeon: Nelva Bush, MD;  Location: [facility];  Service: Cardiovascular;  Laterality: N/A;  . ESOPHAGOGASTRODUODENOSCOPY (EGD) WITH PROPOFOL N/A 07/11/2019   Procedure: ESOPHAGOGASTRODUODENOSCOPY (EGD) WITH PROPOFOL;  Surgeon: Toledo, Benay Pike, MD;  Location: [facility];  Service: Gastroenterology;  Laterality: N/A;  . LAMINECTOMY    . LEFT HEART CATH AND CORONARY ANGIOGRAPHY N/A 06/07/2018   Procedure: LEFT HEART CATH AND CORONARY ANGIOGRAPHY;  Surgeon: Nelva Bush, MD;  Location: [facility];  Service: Cardiovascular;  Laterality: N/A;    Allergies  Allergies  Allergen Reactions  . Novocain [Procaine] Other (See Comments)    Syncope  . Procaine Hcl Other (See Comments)    Passes out  . Lyrica [Pregabalin]   . Neurontin [Gabapentin]   . Amitriptyline Other (See Comments)    Sleepy  . Dicyclomine Other (See Comments)    Other reaction(s): Abdominal Pain  . Metformin Nausea Only  . Nitroglycerin Other (See Comments)    Makes Blood pressure go to low.     History of Present Illness    Cody Price is a 79 y.o. male with a hx of CAD, atrial fibrillation, DM2, back surgery, thrombocytopenia, previous tobacco abuse last seen  03/14/19 by Dr. Rockey Situ.  CAD s/p PTCA of RCA in 1999 that later occluded 100%. He had stent placed to his LAD September 2010.   He had atrial fibrillation post prior back surgery and spontaneously converted. He was previously anticoagulated, but in the setting of hematuria and thrombocytopenia his Eliquis was stopped 06/14/2018. However, recurrent atrial fibrillation on EKG 03/12/19.   Hx of statin intolerance.   Labs reviewed via Care Everywhere:   08/2019 Hb 13.2, RBC 4.13, Platelet 58, K 3.9, creatinine 0.8, GFR 94  04/2019 LDL 30, total cholesterol 80, normal AST/ALT  Reports feeling overall well. No chest, pain, tightness. No SOB nor DOE. Checks BP at home and reports readings consistently 140s-160s.   He is unaware of  his atrial fibrillation with no symptomatic dyspnea nor palpitations.   Reports compliance with Eliquis. No bleeding complications. Reports the medication is expensive, but not unaffordable.   Checks BP at home with readings 160/60. We discussed adjusting his medication for a more optimal BP goal of 130/80. He declines. Tells me he feels poorly when his blood pressure "normal". Long discussion regarding risks of uncontrolled hypertension.   EKGs/Labs/Other Studies Reviewed:   The following studies were reviewed today:   Cardiac catheterization June 07, 2018 1. Severe 2-vessel coronary artery disease, including 90% mid LAD stenosis involving small D1 branch just beyond old stent and chronic total occlusion of the distal RCA.  Severe apical LAD disease is stable from prior catheterization. 2. Moderate, non-obstructive disease involving the LCx. 3. Patent proximal LAD stent with mild in-stent restenosis. 4. Mildly elevated left ventricular filling pressure. 5. Successful PCI to mid LAD using Resolute Onyx 2.5 x 18 mm drug-eluting stent with 0% residual stenosis and TIMI-3 flow.   Recommendations: 1. Medical therapy for chronic total occlusion of RCA and small diagonal branch.  EKG:  EKG is ordered today.  The ekg ordered today demonstrates atrial fibrillation 96 bpm with no acute ST/T wave changes.  Recent Labs: No results found for requested labs within last 8760 hours.  Recent Lipid Panel    Component Value Date/Time   CHOL 69 06/08/2018 0518   CHOL 94 08/26/2014 0355   TRIG 113 06/08/2018 0518   TRIG 191 08/26/2014 0355   HDL 23 (L) 06/08/2018 0518   HDL 23 (L) 08/26/2014 0355   CHOLHDL 3.0 06/08/2018 0518   VLDL 23 06/08/2018 0518   VLDL 38 08/26/2014 0355   LDLCALC 23 06/08/2018 0518   LDLCALC 33 08/26/2014 0355    Home Medications   Current Meds  Medication Sig  . acetaminophen (TYLENOL) 500 MG tablet Take 500 mg by mouth as needed.   Marland Kitchen apixaban (ELIQUIS) 5 MG TABS  tablet Take 1 tablet (5 mg total) by mouth 2 (two) times daily.  Marland Kitchen atorvastatin (LIPITOR) 40 MG tablet Take 1 tablet (40 mg total) by mouth daily at 6 PM.  . Coenzyme Q10 (CO Q-10) 200 MG CAPS Take 200 mg by mouth daily.  . cyanocobalamin (,VITAMIN B-12,) 1000 MCG/ML injection Inject 1,000 mcg into the muscle every 30 (thirty) days.  . Cyanocobalamin (VITAMIN B-12 IJ) Inject 1 vial into the muscle every 30 (thirty) days.   . diazepam (VALIUM) 5 MG tablet Take 5 mg by mouth 3 (three) times daily as needed for anxiety or muscle spasms.   Marland Kitchen dicyclomine (BENTYL) 10 MG capsule Take 10 mg by mouth 4 (four) times daily -  before meals and at bedtime.  Marland Kitchen diltiazem (CARDIZEM CD) 180 MG 24 hr  capsule Take 180 mg by mouth daily before breakfast.   . feeding supplement, ENSURE ENLIVE, (ENSURE ENLIVE) LIQD Take 237 mLs by mouth daily.  . finasteride (PROSCAR) 5 MG tablet Take 1 tablet (5 mg total) by mouth daily.  Marland Kitchen glimepiride (AMARYL) 4 MG tablet Take 4 mg by mouth 2 (two) times daily.  . hydrochlorothiazide (HYDRODIURIL) 25 MG tablet Take 25 mg by mouth daily.  Marland Kitchen LINZESS 72 MCG capsule TAKE 1 CAPSULE (72 MCG TOTAL) BY MOUTH ONCE DAILY  . lisinopril (PRINIVIL,ZESTRIL) 10 MG tablet TAKE ONE TABLET BY MOUTH ONCE DAILY (Patient taking differently: Take 10 mg by mouth daily. )  . Multiple Vitamin (MULTIVITAMIN WITH MINERALS) TABS tablet Take 1 tablet by mouth daily.  Marland Kitchen nystatin cream (MYCOSTATIN) Apply 1 application topically 2 (two) times daily.  Marland Kitchen omega-3 acid ethyl esters (LOVAZA) 1 g capsule Take 2 g by mouth daily.  Marland Kitchen omeprazole (PRILOSEC) 40 MG capsule Take 40 mg by mouth 2 (two) times daily.  Marland Kitchen oxyCODONE-acetaminophen (PERCOCET) 10-325 MG tablet Take 1 tablet by mouth every 4 (four) hours as needed for pain.   . polyethylene glycol-electrolytes (NULYTELY/GOLYTELY) 420 g solution Take by mouth.  . sildenafil (VIAGRA) 100 MG tablet Take 100 mg by mouth daily as needed for erectile dysfunction.   .  simvastatin (ZOCOR) 40 MG tablet Take 40 mg by mouth daily.  . sucralfate (CARAFATE) 1 g tablet Take 1 g by mouth 4 (four) times daily.  . tamsulosin (FLOMAX) 0.4 MG CAPS capsule Take 1 capsule (0.4 mg total) by mouth daily.  Marland Kitchen testosterone cypionate (DEPOTESTOSTERONE CYPIONATE) 200 MG/ML injection Inject 200 mg into the muscle every 28 (twenty-eight) days.     Review of Systems      Review of Systems  Constitution: Negative for chills, fever and malaise/fatigue.  Cardiovascular: Negative for chest pain, dyspnea on exertion, irregular heartbeat, leg swelling, near-syncope, orthopnea, palpitations and syncope.  Respiratory: Negative for cough, shortness of breath and wheezing.   Gastrointestinal: Negative for melena, nausea and vomiting.  Genitourinary: Negative for hematuria.  Neurological: Negative for dizziness, light-headedness and weakness.   All other systems reviewed and are otherwise negative except as noted above.  Physical Exam    VS:  BP (!) 150/64 (BP Location: Left Arm, Patient Position: Sitting, Cuff Size: Normal)   Pulse 96   Ht 6\' 1"  (1.854 m)   Wt 250 lb 8 oz (113.6 kg)   SpO2 98%   BMI 33.05 kg/m  , BMI Body mass index is 33.05 kg/m. GEN: Well nourished, overweight, well developed, in no acute distress. HEENT: normal. Neck: Supple, no JVD, carotid bruits, or masses. Cardiac: irregularly irregular, no murmurs, rubs, or gallops. No clubbing, cyanosis, edema.  Radials/DP/PT 2+ and equal bilaterally.  Respiratory:  Respirations regular and unlabored, clear to auscultation bilaterally. GI: Soft, nontender, nondistended, BS + x 4. MS: No deformity or atrophy. Skin: Warm and dry, no rash. Neuro:  Strength and sensation are intact. Psych: Normal affect.  Accessory Clinical Findings    ECG personally reviewed by me today - atrial fibrillation 96 bpm - no acute changes.  Assessment & Plan    1. Permanent atrial fibrillation - EKG today shows continued atrial  fibrillation 96 bpm. On review, in atrial fibrillation since at least October 2019. Asymptomatic and unaware of his atrial fibrillation. Continue Diltiazem 180mg  daily - we discussed increasing dose for optimization of BP/HR, but he declined. Continue Eliquis by 5mg  BID.  2. Chronic anticoagulation - Secondary to persistent  atrial fibrillation and CHADs2VASc of at least 5. CBC Dec 2020 with Hb 13.1. No bleeding complications. Continue Eliquis 5mg  BID. Samples provided in the office today.  3. CAD - Stable with no anginal symptoms. EKG today without acute ST/T wave changes. No indication for ischemic evaluation at this time. GDMT includes statin, CCB. No aspirin secondary to chronic anticoagulation. Ideally would be on beta blocker in setting of CAD/atrial fibrillation. Would likely benefit from Coreg for optimization of BP but declines medication changes today.   4. HLD - LDL goal <70. Lipid profile 04/2019 with LDL 30.  Continue Pravastatin.   5. HTN - BP not at goal Reports home readings 140s-160s. He declines medication changes today despite long discussion of risks of uncontrolled HTN.   6. DM2 -Recent A1c 08/21/19 6.6. Importance of diet and exercise discussed. If additional agent needed, consider SGLT2i for cardioprotective benefit such as Jardiance.   Disposition: Follow up in 6 month(s) with Dr. Rockey Situ or APP.    Loel Dubonnet, NP 10/19/2019, 8:59 AM

## 2019-10-19 ENCOUNTER — Encounter: Payer: Self-pay | Admitting: Family

## 2019-10-19 ENCOUNTER — Ambulatory Visit: Payer: Medicare HMO | Admitting: Family

## 2019-10-19 ENCOUNTER — Other Ambulatory Visit: Payer: Self-pay

## 2019-10-19 VITALS — BP 150/64 | HR 96 | Ht 73.0 in | Wt 250.5 lb

## 2019-10-19 DIAGNOSIS — E119 Type 2 diabetes mellitus without complications: Secondary | ICD-10-CM

## 2019-10-19 DIAGNOSIS — I4821 Permanent atrial fibrillation: Secondary | ICD-10-CM | POA: Diagnosis not present

## 2019-10-19 DIAGNOSIS — I2510 Atherosclerotic heart disease of native coronary artery without angina pectoris: Secondary | ICD-10-CM | POA: Diagnosis not present

## 2019-10-19 DIAGNOSIS — Z7901 Long term (current) use of anticoagulants: Secondary | ICD-10-CM | POA: Diagnosis not present

## 2019-10-19 DIAGNOSIS — E785 Hyperlipidemia, unspecified: Secondary | ICD-10-CM

## 2019-10-19 DIAGNOSIS — I10 Essential (primary) hypertension: Secondary | ICD-10-CM | POA: Diagnosis not present

## 2019-10-19 NOTE — Patient Instructions (Addendum)
Medication Instructions:  Continue current medications.   *If you need a refill on your cardiac medications before your next appointment, please call your pharmacy*  Lab Work: None today.   Testing/Procedures: You had an EKG today. It showed rate controlled atrial fibrillation. This is a stable finding for you.  Follow-Up: At [facility], you and your health needs are our priority.  As part of our continuing mission to provide you with exceptional heart care, we have created designated Provider Care Teams.  These Care Teams include your primary Cardiologist (physician) and Advanced Practice Providers (APPs -  Physician Assistants and Nurse Practitioners) who all work together to provide you with the care you need, when you need it.  Your next appointment:  6 months in person with Dr. Rockey Situ or APP  Other:  Recommend blood pressure goal closer to 130/80. If it is consistently high, please let us know and we will likely adjust one of your current medications.    Atrial Fibrillation  Atrial fibrillation is a type of heartbeat that is irregular or fast. If you have this condition, your heart beats without any order. This makes it hard for your heart to pump blood in a normal way. Atrial fibrillation may come and go, or it may become a long-lasting problem. If this condition is not treated, it can put you at higher risk for stroke, heart failure, and other heart problems. What are the causes? This condition may be caused by diseases that damage the heart. They include:  High blood pressure.  Heart failure.  Heart valve disease.  Heart surgery. Other causes include:  Diabetes.  Thyroid disease.  Being overweight.  Kidney disease. Sometimes the cause is not known. What increases the risk? You are more likely to develop this condition if:  You are older.  You smoke.  You exercise often and very hard.  You have a family history of this condition.  You are a  man.  You use drugs.  You drink a lot of alcohol.  You have lung conditions, such as emphysema, pneumonia, or COPD.  You have sleep apnea. What are the signs or symptoms? Common symptoms of this condition include:  A feeling that your heart is beating very fast.  Chest pain or discomfort.  Feeling short of breath.  Suddenly feeling light-headed or weak.  Getting tired easily during activity.  Fainting.  Sweating. In some cases, there are no symptoms. How is this treated? Treatment for this condition depends on underlying conditions and how you feel when you have atrial fibrillation. They include:  Medicines to: ? Prevent blood clots. ? Treat heart rate or heart rhythm problems.  Using devices, such as a pacemaker, to correct heart rhythm problems.  Doing surgery to remove the part of the heart that sends bad signals.  Closing an area where clots can form in the heart (left atrial appendage). In some cases, your doctor will treat other underlying conditions. Follow these instructions at home: Medicines  Take over-the-counter and prescription medicines only as told by your doctor.  Do not take any new medicines without first talking to your doctor.  If you are taking blood thinners: ? Talk with your doctor before you take any medicines that have aspirin or NSAIDs, such as ibuprofen, in them. ? Take your medicine exactly as told by your doctor. Take it at the same time each day. ? Avoid activities that could hurt or bruise you. Follow instructions about how to prevent falls. ? Wear a  bracelet that says you are taking blood thinners. Or, carry a card that lists what medicines you take. Lifestyle      Do not use any products that have nicotine or tobacco in them. These include cigarettes, e-cigarettes, and chewing tobacco. If you need help quitting, ask your doctor.  Eat heart-healthy foods. Talk with your doctor about the right eating plan for you.  Exercise  regularly as told by your doctor.  Do not drink alcohol.  Lose weight if you are overweight.  Do not use drugs, including cannabis. General instructions  If you have a condition that causes breathing to stop for a short period of time (apnea), treat it as told by your doctor.  Keep a healthy weight. Do not use diet pills unless your doctor says they are safe for you. Diet pills may make heart problems worse.  Keep all follow-up visits as told by your doctor. This is important. Contact a doctor if:  You notice a change in the speed, rhythm, or strength of your heartbeat.  You are taking a blood-thinning medicine and you get more bruising.  You get tired more easily when you move or exercise.  You have a sudden change in weight. Get help right away if:   You have pain in your chest or your belly (abdomen).  You have trouble breathing.  You have side effects of blood thinners, such as blood in your vomit, poop (stool), or pee (urine), or bleeding that cannot stop.  You have any signs of a stroke. "BE FAST" is an easy way to remember the main warning signs: ? B - Balance. Signs are dizziness, sudden trouble walking, or loss of balance. ? E - Eyes. Signs are trouble seeing or a change in how you see. ? F - Face. Signs are sudden weakness or loss of feeling in the face, or the face or eyelid drooping on one side. ? A - Arms. Signs are weakness or loss of feeling in an arm. This happens suddenly and usually on one side of the body. ? S - Speech. Signs are sudden trouble speaking, slurred speech, or trouble understanding what people say. ? T - Time. Time to call emergency services. Write down what time symptoms started.  You have other signs of a stroke, such as: ? A sudden, very bad headache with no known cause. ? Feeling like you may vomit (nausea). ? Vomiting. ? A seizure. These symptoms may be an emergency. Do not wait to see if the symptoms will go away. Get medical help  right away. Call your local emergency services (911 in the [place] Do not drive yourself to the hospital. Summary  Atrial fibrillation is a type of heartbeat that is irregular or fast.  You are at higher risk of this condition if you smoke, are older, have diabetes, or are overweight.  Follow your doctor's instructions about medicines, diet, exercise, and follow-up visits.  Get help right away if you have signs or symptoms of a stroke.  Get help right away if you cannot catch your breath, or you have chest pain or discomfort. This information is not intended to replace advice given to you by your health care provider. Make sure you discuss any questions you have with your health care provider. Document Revised: 02/14/2019 Document Reviewed: 02/14/2019 Elsevier Patient Education  [place].

## 2019-11-11 ENCOUNTER — Other Ambulatory Visit: Payer: Self-pay | Admitting: Cardiovascular Disease

## 2019-11-12 NOTE — Telephone Encounter (Signed)
Can you please review.  Patients medication list has Atorvastatin and Simvastatin.   Patients last office Note with [facility] States Continue Pravastatin.   Can you please let me know which one to fill.   Thanks!

## 2019-11-13 NOTE — Telephone Encounter (Signed)
Looks like Atorvastatin 40mg  was most recently prescribed. Would recommend continuing this as it is more effective than Simvastatin.   Pravastatin in note was error in dictation, my apologies.   Likely beneficial to call patient to ensure he is not taking both Simvastatin and Atorvastatin. Likely has not been removed from med list.   Thanks!

## 2019-11-13 NOTE — Telephone Encounter (Signed)
Do you know which ones he should be taking?   Thanks

## 2019-11-14 NOTE — Telephone Encounter (Signed)
I had Caitlin review patients chart to clarify medications. I will call patient to confirm and then update as well.  Tks

## 2019-11-14 NOTE — Telephone Encounter (Signed)
Called and spoke with patients wife per release form and advised that patient should only be taking atorvastatin 40 mg once daily and to make sure he is not taking the simvastatin as well. She verbalized understanding of instructions and will make sure she gets the medication corrected with him. Sending in refill of atorvastatin for them to pick up. No further questions at this time.

## 2019-11-15 DIAGNOSIS — Z0100 Encounter for examination of eyes and vision without abnormal findings: Secondary | ICD-10-CM | POA: Diagnosis not present

## 2019-11-15 DIAGNOSIS — E1101 Type 2 diabetes mellitus with hyperosmolarity with coma: Secondary | ICD-10-CM | POA: Diagnosis not present

## 2019-11-16 DIAGNOSIS — E538 Deficiency of other specified B group vitamins: Secondary | ICD-10-CM | POA: Diagnosis not present

## 2019-11-19 DIAGNOSIS — E119 Type 2 diabetes mellitus without complications: Secondary | ICD-10-CM | POA: Diagnosis not present

## 2019-11-21 DIAGNOSIS — R69 Illness, unspecified: Secondary | ICD-10-CM | POA: Diagnosis not present

## 2019-11-22 ENCOUNTER — Ambulatory Visit: Payer: Medicare HMO | Attending: Internal Medicine

## 2019-11-22 DIAGNOSIS — Z23 Encounter for immunization: Secondary | ICD-10-CM

## 2019-11-22 NOTE — Progress Notes (Signed)
   Covid-19 Vaccination Clinic  Name:  Cody Price    MRN: [id number] DOB: [birth year]  11/22/2019  [name] was observed post Covid-19 immunization for 15 minutes without incident. He was provided with Vaccine Information Sheet and instruction to access the V-Safe system.   [name] was instructed to call 911 with any severe reactions post vaccine: Marland Kitchen Difficulty breathing  . Swelling of face and throat  . A fast heartbeat  . A bad rash all over body  . Dizziness and weakness   Immunizations Administered    Name Date Dose VIS Date Route   Pfizer COVID-19 Vaccine 11/22/2019  8:56 AM 0.3 mL 08/17/2019 Intramuscular   Manufacturer: [place]   Lot: [id number]   [place]: [id number]

## 2019-11-26 DIAGNOSIS — E119 Type 2 diabetes mellitus without complications: Secondary | ICD-10-CM | POA: Diagnosis not present

## 2019-11-26 DIAGNOSIS — I10 Essential (primary) hypertension: Secondary | ICD-10-CM | POA: Diagnosis not present

## 2019-11-26 DIAGNOSIS — Z0000 Encounter for general adult medical examination without abnormal findings: Secondary | ICD-10-CM | POA: Diagnosis not present

## 2019-11-26 DIAGNOSIS — E538 Deficiency of other specified B group vitamins: Secondary | ICD-10-CM | POA: Diagnosis not present

## 2019-11-26 DIAGNOSIS — M4807 Spinal stenosis, lumbosacral region: Secondary | ICD-10-CM | POA: Diagnosis not present

## 2019-11-26 DIAGNOSIS — Z87891 Personal history of nicotine dependence: Secondary | ICD-10-CM | POA: Diagnosis not present

## 2019-12-01 ENCOUNTER — Ambulatory Visit: Payer: Medicare HMO

## 2019-12-18 ENCOUNTER — Ambulatory Visit: Payer: Medicare HMO | Attending: Internal Medicine

## 2019-12-18 DIAGNOSIS — E538 Deficiency of other specified B group vitamins: Secondary | ICD-10-CM | POA: Diagnosis not present

## 2019-12-18 DIAGNOSIS — Z23 Encounter for immunization: Secondary | ICD-10-CM

## 2019-12-18 NOTE — Progress Notes (Signed)
   Covid-19 Vaccination Clinic  Name:  [name]    MRN: [id number] DOB: 15-Apr-1941  12/18/2019  [name] was observed post Covid-19 immunization for 15 minutes without incident. He was provided with Vaccine Information Sheet and instruction to access the V-Safe system.   [name] was instructed to call 911 with any severe reactions post vaccine: Marland Kitchen Difficulty breathing  . Swelling of face and throat  . A fast heartbeat  . A bad rash all over body  . Dizziness and weakness   Immunizations Administered    Name Date Dose VIS Date Route   Pfizer COVID-19 Vaccine 12/18/2019  8:37 AM 0.3 mL 08/17/2019 Intramuscular   Manufacturer: [place]   Lot: [id number]   [place]: [id number]

## 2019-12-24 ENCOUNTER — Encounter: Payer: Self-pay | Admitting: Urology

## 2019-12-24 ENCOUNTER — Telehealth: Payer: Self-pay | Admitting: Urology

## 2019-12-24 ENCOUNTER — Ambulatory Visit: Payer: Medicare HMO | Admitting: Urology

## 2019-12-24 ENCOUNTER — Other Ambulatory Visit: Payer: Self-pay

## 2019-12-24 VITALS — BP 122/63 | HR 90 | Ht 73.0 in | Wt 242.0 lb

## 2019-12-24 DIAGNOSIS — N401 Enlarged prostate with lower urinary tract symptoms: Secondary | ICD-10-CM

## 2019-12-24 DIAGNOSIS — N138 Other obstructive and reflux uropathy: Secondary | ICD-10-CM

## 2019-12-24 DIAGNOSIS — N529 Male erectile dysfunction, unspecified: Secondary | ICD-10-CM | POA: Diagnosis not present

## 2019-12-24 DIAGNOSIS — R351 Nocturia: Secondary | ICD-10-CM

## 2019-12-24 DIAGNOSIS — N481 Balanitis: Secondary | ICD-10-CM

## 2019-12-24 DIAGNOSIS — R310 Gross hematuria: Secondary | ICD-10-CM | POA: Diagnosis not present

## 2019-12-24 LAB — BLADDER SCAN AMB NON-IMAGING: Scan Result: 12

## 2019-12-24 MED ORDER — AMBULATORY NON FORMULARY MEDICATION: 0 refills | Status: DC

## 2019-12-24 NOTE — Progress Notes (Signed)
9:09 AM   Cody Price 1940-09-25 [id number]  Referring provider: Baxter Hire, MD [place],  [place] [address]  Chief Complaint  Patient presents with  . Benign Prostatic Hypertrophy    HPI: Cody Price is a 79 year old diabetic male with a history of a penile rash, BPH with LU TS, ED and hematuria who presents today for follow up.    Penile rash Occurs occasionally.  He states nystatin makes it worse.  His wife does not want him to have a circumcision.    BPH WITH LUTS  (prostate and/or bladder) IPSS score: 9/2      PVR: 12 mL     Previous score: 9/1     Previous PVR: 26 mL  Major complaint(s):  Frequency and intermittent dysuria x several years.  Patient denies any dysuria or suprapubic/flank pain.  Patient denies any fevers, chills, nausea or vomiting.   IPSS    Row Name 12/24/19 1500         International Prostate Symptom Score   How often have you had the sensation of not emptying your bladder?  Not at All     How often have you had to urinate less than every two hours?  More than half the time     How often have you found you stopped and started again several times when you urinated?  Not at All     How often have you found it difficult to postpone urination?  Less than half the time     How often have you had a weak urinary stream?  Not at All     How often have you had to strain to start urination?  Not at All     How many times did you typically get up at night to urinate?  3 Times     Total IPSS Score  9       Quality of Life due to urinary symptoms   If you were to spend the rest of your life with your urinary condition just the way it is now how would you feel about that?  Mostly Satisfied        Score:  1-7 Mild 8-19 Moderate 20-35 Severe   Erectile dysfunction His previous SHIM was 4.  He has been having difficulty with erections for several years.   His major complaint is achieving an erection.  His libido is preserved.    His risk factors for ED are age, hyperlipidemia, diabetes, MI, anticoagulation therapy, hypertension, testosterone deficiency and BPH.  He denies any painful erections or curvatures with his erections.   He has tried PDE 5 inhibitors in the past, but they have been ineffective.  He is interested in other treatment options at this time.     Testosterone deficiency Managed by his PCP with testosterone cypionate injections.  History of hematuria (high risk) Former smoker.  He did admit to having an episode of gross hematuria after he had his cardiac stents placed, but he was seen in the ED and it was attributed to his anticoagulant therapy.   He did undergo a hematuria work up in 2015 with CTU and cystoscopy.  Findings positive for right kidney cyst.  He has had a recent contrast CT in 05/2019 which the cyst in the right kidney.  Cystoscopy 06/29/2019 with Dr. Erlene Quan noted friable prostatic mucosa.  CT urogram was offered, but declined.  No reports of gross hematuria today.  PMH: Past Medical History:  Diagnosis Date  . Arthritis    degenerative back   . BPH with obstruction/lower urinary tract symptoms   . CAD (coronary artery disease) 2010   stent  . Diabetes mellitus   . ED (erectile dysfunction)   . Elevated PSA   . GERD (gastroesophageal reflux disease)   . Gross hematuria   . H/O CT scan    recent renal CT due to hematuria, cystoscopy - normal    . Hematuria   . History of hiatal hernia   . History of kidney stones   . HLD (hyperlipidemia)   . Hypertension   . Ischemic heart disease 1991   with angioplasty  . Kidney stone   . Morbid obesity ([place])   . Myocardial infarction ([place])   . Peripheral neuropathy   . Pernicious anemia   . Pneumonia 1995   /w PE,   . Pulmonary embolism ([place])    previous  . Renal cyst   . Sleep apnea    pt. denies sleep apnea, pt. states he has had 2 studies - last one 2011  . Spinal stenosis     Surgical History: Past Surgical History:    Procedure Laterality Date  . APPENDECTOMY    . BACK SURGERY     x3 back surgeries - /w fusioin, 1- [facility]  . bilateral inguinal hernia repair    . cad     stent 2010  . CARDIAC CATHETERIZATION  08/17/2014  . CARPAL TUNNEL RELEASE  2014   bilateral   . CHOLECYSTECTOMY    . COLONOSCOPY WITH PROPOFOL N/A 07/11/2019   Procedure: COLONOSCOPY WITH PROPOFOL;  Surgeon: Toledo, Benay Pike, MD;  Location: [facility];  Service: Gastroenterology;  Laterality: N/A;  . CORONARY STENT INTERVENTION N/A 06/07/2018   Procedure: CORONARY STENT INTERVENTION;  Surgeon: Nelva Bush, MD;  Location: [facility];  Service: Cardiovascular;  Laterality: N/A;  . ESOPHAGOGASTRODUODENOSCOPY (EGD) WITH PROPOFOL N/A 07/11/2019   Procedure: ESOPHAGOGASTRODUODENOSCOPY (EGD) WITH PROPOFOL;  Surgeon: Toledo, Benay Pike, MD;  Location: [facility];  Service: Gastroenterology;  Laterality: N/A;  . LAMINECTOMY    . LEFT HEART CATH AND CORONARY ANGIOGRAPHY N/A 06/07/2018   Procedure: LEFT HEART CATH AND CORONARY ANGIOGRAPHY;  Surgeon: Nelva Bush, MD;  Location: [facility];  Service: Cardiovascular;  Laterality: N/A;    Home Medications:  Allergies as of 12/24/2019      Reactions   Novocain [procaine] Other (See Comments)   Syncope   Procaine Hcl Other (See Comments)   Passes out   Lyrica [pregabalin]    Neurontin [gabapentin]    Amitriptyline Other (See Comments)   Sleepy   Dicyclomine Other (See Comments)   Other reaction(s): Abdominal Pain   Metformin Nausea Only   Nitroglycerin Other (See Comments)   Makes Blood pressure go to low.       Medication List       Accurate as of December 24, 2019 11:59 PM. If you have any questions, ask your nurse or doctor.        acetaminophen 500 MG tablet Commonly known as: TYLENOL Take 500 mg by mouth as needed.   AMBULATORY NON FORMULARY MEDICATION Trimix (30/1/10)-(Pap/Phent/PGE)  Test Dose  68ml vial   Qty #3 [place] [phone] Fax [phone] Started by: Zara Council, PA-C   apixaban 5 MG Tabs tablet Commonly known as: ELIQUIS Take 1 tablet (5 mg total) by mouth 2 (two) times  daily.   atorvastatin 40 MG tablet Commonly known as: LIPITOR TAKE 1 TABLET (40 MG TOTAL) BY MOUTH DAILY AT 6 PM.   Co Q-10 200 MG Caps Take 200 mg by mouth daily.   cyanocobalamin 1000 MCG/ML injection Commonly known as: (VITAMIN B-12) Inject 1,000 mcg into the muscle every 30 (thirty) days.   dicyclomine 10 MG capsule Commonly known as: BENTYL Take 10 mg by mouth 4 (four) times daily -  before meals and at bedtime.   diltiazem 180 MG 24 hr capsule Commonly known as: CARDIZEM CD Take 180 mg by mouth daily before breakfast.   DULoxetine 30 MG capsule Commonly known as: CYMBALTA Take by mouth.   feeding supplement (ENSURE ENLIVE) Liqd Take 237 mLs by mouth daily.   finasteride 5 MG tablet Commonly known as: Proscar Take 1 tablet (5 mg total) by mouth daily.   glimepiride 4 MG tablet Commonly known as: AMARYL Take 4 mg by mouth 2 (two) times daily.   hydrochlorothiazide 25 MG tablet Commonly known as: HYDRODIURIL Take 25 mg by mouth daily.   Linzess 72 MCG capsule Generic drug: linaclotide TAKE 1 CAPSULE (72 MCG TOTAL) BY MOUTH ONCE DAILY   lisinopril 10 MG tablet Commonly known as: ZESTRIL TAKE ONE TABLET BY MOUTH ONCE DAILY   multivitamin with minerals Tabs tablet Take 1 tablet by mouth daily.   nystatin cream Commonly known as: MYCOSTATIN Apply 1 application topically 2 (two) times daily.   omega-3 acid ethyl esters 1 g capsule Commonly known as: LOVAZA Take 2 g by mouth daily.   omeprazole 40 MG capsule Commonly known as: PRILOSEC Take 40 mg by mouth 2 (two) times daily.   oxyCODONE-acetaminophen 10-325 MG tablet Commonly known as: PERCOCET Take 1 tablet by mouth every 4 (four) hours as needed for pain.   polyethylene glycol-electrolytes 420 g  solution Commonly known as: NuLYTELY Take by mouth.   simvastatin 40 MG tablet Commonly known as: ZOCOR Take 40 mg by mouth daily.   sucralfate 1 g tablet Commonly known as: CARAFATE Take 1 g by mouth 4 (four) times daily.   tamsulosin 0.4 MG Caps capsule Commonly known as: FLOMAX Take 1 capsule (0.4 mg total) by mouth daily.   testosterone cypionate 200 MG/ML injection Commonly known as: DEPOTESTOSTERONE CYPIONATE Inject 200 mg into the muscle every 28 (twenty-eight) days.   Valium 5 MG tablet Generic drug: diazepam Take 5 mg by mouth 3 (three) times daily as needed for anxiety or muscle spasms.   Viagra 100 MG tablet Generic drug: sildenafil Take 100 mg by mouth daily as needed for erectile dysfunction.       Allergies:  Allergies  Allergen Reactions  . Novocain [Procaine] Other (See Comments)    Syncope  . Procaine Hcl Other (See Comments)    Passes out  . Lyrica [Pregabalin]   . Neurontin [Gabapentin]   . Amitriptyline Other (See Comments)    Sleepy  . Dicyclomine Other (See Comments)    Other reaction(s): Abdominal Pain  . Metformin Nausea Only  . Nitroglycerin Other (See Comments)    Makes Blood pressure go to low.     Family History: Family History  Problem Relation Age of Onset  . Heart failure Mother   . Heart disease Father   . Heart attack Brother   . Skin cancer Sister   . Kidney disease Neg Hx   . Prostate cancer Neg Hx   . Kidney cancer Neg Hx   . Bladder Cancer Neg Hx  Social History:  reports that he quit smoking about 34 years ago. He has a 45.00 pack-year smoking history. He has quit using smokeless tobacco. He reports current alcohol use. He reports that he does not use drugs.  ROS: For pertinent review of systems please refer to history of present illness  Physical Exam: BP 122/63   Pulse 90   Ht 6\' 1"  (1.854 m)   Wt 242 lb (109.8 kg)   BMI 31.93 kg/m   Constitutional:  Well nourished. Alert and oriented, No acute  distress. HEENT: [place] AT, mask in place.  Trachea midline. Cardiovascular: No clubbing, cyanosis, or edema. Respiratory: Normal respiratory effort, no increased work of breathing. Neurologic: Grossly intact, no focal deficits, moving all 4 extremities. Psychiatric: Normal mood and affect.  Laboratory Data: Urinalysis Component     Latest Ref Rng & Units 06/05/2019  Color, Urine     YELLOW AMBER (A)  Appearance     CLEAR CLEAR  Specific Gravity, Urine     1.005 - 1.030 1.020  pH     5.0 - 8.0 7.0  Glucose, UA     NEGATIVE mg/dL 100 (A)  Hgb urine dipstick     NEGATIVE NEGATIVE  Bilirubin Urine     NEGATIVE SMALL (A)  Ketones, ur     NEGATIVE mg/dL TRACE (A)  Protein     NEGATIVE mg/dL NEGATIVE  Nitrite     NEGATIVE NEGATIVE  Leukocytes,Ua     NEGATIVE NEGATIVE  Squamous Epithelial / LPF     0 - 5 0-5  WBC, UA     0 - 5 WBC/hpf 0-5  RBC / HPF     0 - 5 RBC/hpf 0-5  Bacteria, UA     NONE SEEN FEW (A)  Mucus      PRESENT   Lab Results  Component Value Date   WBC 8.7 06/14/2018   HGB 14.6 06/14/2018   HCT 40.9 06/14/2018   MCV 88.1 06/14/2018   PLT 79 (L) 06/14/2018   Lab Results  Component Value Date   CREATININE 0.96 06/14/2018   Lab Results  Component Value Date   AST 15 06/14/2018   Lab Results  Component Value Date   ALT 18 06/14/2018   I have reviewed the labs.  Pertinent Imaging Results for JACE, FREIER (MRN [id number]) as of 12/28/2019 09:12  Ref. Range 12/24/2019 15:13  Scan Result Unknown 12     Assessment & Plan:    1. Gross hematuria Hematuria work up completed in 06/2019 - findings positive for friable prostate No report of gross hematuria  RTC in one year for UA - patient to report any gross hematuria in the interim    2. Balanitis Controlled with the Nystatin cream Patient's wife does not want him to be circumcised   3. BPH with LUTS IPSS score is 9/2, it is stable  Continue conservative management, avoiding bladder  irritants and timed voiding's Frequency and painful urination Continue tamsulosin 0.4 mg daily and finasteride 5 mg daily  4. ED Patient would like to have a trial of intercavernousal injections at this time   Return for schedule a Trimix titration - it needs to be in the morning .  These notes generated with voice recognition software. I apologize for typographical errors.  Zara Council, PA-C  [facility] [address] [place],  [address] [phone]

## 2019-12-24 NOTE — Telephone Encounter (Signed)
Would you call in a test dose of Trimix for Cody Price?

## 2019-12-24 NOTE — Telephone Encounter (Signed)
Rx sent to pharmacy   

## 2020-01-07 ENCOUNTER — Ambulatory Visit: Payer: Medicare HMO | Admitting: Urology

## 2020-01-07 ENCOUNTER — Other Ambulatory Visit: Payer: Self-pay

## 2020-01-07 ENCOUNTER — Encounter: Payer: Self-pay | Admitting: Urology

## 2020-01-07 VITALS — BP 141/73 | HR 96 | Ht 73.0 in | Wt 242.0 lb

## 2020-01-07 DIAGNOSIS — N529 Male erectile dysfunction, unspecified: Secondary | ICD-10-CM

## 2020-01-07 DIAGNOSIS — R69 Illness, unspecified: Secondary | ICD-10-CM | POA: Diagnosis not present

## 2020-01-07 NOTE — Patient Instructions (Signed)
TRIMIX SELF-INJECTION INSTRUCTIONS    DETAILED PROCEDURE  1. GETTING SET UP  A. Proper hygiene is important. Wash your hands and keep the penis clean.  B. Assemble the following:  - Bottle of Trimix  - Alcohol pad  - Syringe  C. Keep the Trimix cold by returning the bottle to the refrigerator, or by placing the bottle in a cup of ice.   2. PREPARE THE SYRINGE  A. Wipe the rubber top of the vial with an alcohol pad.  B. After removing the cap of the needle, pull the plunger back to the desired dosage, filling this volume with air. Use a new needle and syringe each time.  C. Insert the needle through the rubber top and inject the air into the vial.  D. Turn the vial with needle and syringe inserted upside down. Pull back on the syringe plunger in a slow and steady motion until the desired dosage is achieved.  E. Tap the side of the syringe (1cc tuberculin syringe with a 29 gauge needle) to allow any air bubbles to float towards the needle. Avoid having these air bubbles in the syringe when self-injecting by first injecting out the collected bubbles that may form.  F. Remove the needle from the bottle and replace the protective cap on the needle.    3. SELECT AND PREPARE THE SITE FOR INJECTION  A. The proper location for injection is at the 9-11 and 1-3 o'clock positions, between the base and mid-portion of the penis.(see diagram) Avoid the midline because of potential for injury to the urethra (6 o'clock; for urinary passage) and the penile arteries and nerves (near 12 o'clock). Avoid any visible veins or arteries on the surface.  B. Grasp and pull the head of the penis toward the side of your leg with the index finger and thumb (use the left hand, if right handed). While maintaining light tension, select a site for injection.  C. Clean the site with an alcohol pad.   4: INJECT TRIMIX AND APPLY COMPRESSION  A. With a steady and continuous motion, penetrate the skin with the needle at a 90 o  angle. The needle should then be advanced to the hub. Slight resistance is encountered as the needle passes into the proper position within the erectile tissue (corporeal body).  B. Inject the Trimix over approximately 4 seconds. Withdraw the needle from the penis and apply compression to the injection site for approximately 1 minute. Several minutes of compression may be required to avoid bleeding, especially if you are an aspirin user.  C. Replace the cap on the needle and dispose of properly.     

## 2020-01-07 NOTE — Progress Notes (Signed)
Mr. Genest presents today for a Trimix titration.  He is no longer spontaneous erections for one year.   He has had no response to PDE5i's.   He denies any history of sickle cell anemia or trait, a history of multiple myeloma or a history of leukemia.  He has not taken trazodone or a PDE5i's today.    Patient's left corpus cavernosum is identified.  An area near the base of the penis is cleansed with rubbing alcohol.  Careful to avoid the dorsal vein, 2 mcg of Trimix (papaverine 30 mg, phentolamine 1 mg and prostaglandin E1 10 mcg, Lot # [id number]  exp # 02/14/2020) is injected at a 90 degree angle into the left corpus cavernosum near the base of the penis.  Patient experienced a semi firm erection in 15 minutes.  We then injected another 2 mcg into the right corpus cavernosum after cleansing the area with alcohol.  His erection was still semi firm, but he was satisfied with this.    He states he is comfortable with injecting himself.  I have advised him not to inject more than 4 mcg at one time and not more than every other day.  If he should have questions or feel the need to inject more medication, he is to contact the office.    Advised patient of the condition of priapism, painful erection lasting for more than four hours, and to contact the office immediately or seek treatment in the ED

## 2020-01-25 DIAGNOSIS — E538 Deficiency of other specified B group vitamins: Secondary | ICD-10-CM | POA: Diagnosis not present

## 2020-02-22 DIAGNOSIS — R69 Illness, unspecified: Secondary | ICD-10-CM | POA: Diagnosis not present

## 2020-02-25 ENCOUNTER — Other Ambulatory Visit: Payer: Self-pay | Admitting: Urology

## 2020-02-25 ENCOUNTER — Telehealth: Payer: Self-pay | Admitting: Urology

## 2020-02-25 DIAGNOSIS — E538 Deficiency of other specified B group vitamins: Secondary | ICD-10-CM | POA: Diagnosis not present

## 2020-02-25 MED ORDER — FINASTERIDE 5 MG PO TABS: 5.0000 mg | ORAL_TABLET | Freq: Every day | ORAL | 3 refills | Status: DC

## 2020-02-25 MED ORDER — TAMSULOSIN HCL 0.4 MG PO CAPS: 0.4000 mg | ORAL_CAPSULE | Freq: Every day | ORAL | 3 refills | Status: DC

## 2020-02-25 NOTE — Telephone Encounter (Signed)
Asks for refill of Flomax and Proscar to be sent to [facility] in [place]. Please advise.

## 2020-02-25 NOTE — Progress Notes (Unsigned)
Refills for finasteride and tamsulosin sent to his pharmacy.

## 2020-02-28 ENCOUNTER — Other Ambulatory Visit: Payer: Self-pay | Admitting: Cardiovascular Disease

## 2020-02-28 NOTE — Telephone Encounter (Signed)
Please review for refill. Thanks!  

## 2020-02-28 NOTE — Telephone Encounter (Signed)
Eliquis 5mg  refill request received. Patient is 79 years old, weight-109.8kg, Crea-0.90 on 3/15/20201 via [facility] from [facility], [place], and last seen by Sabas Sous on 10/19/2019. Dose is appropriate based on dosing criteria. Will send in refill to requested pharmacy.

## 2020-03-20 DIAGNOSIS — E119 Type 2 diabetes mellitus without complications: Secondary | ICD-10-CM | POA: Diagnosis not present

## 2020-03-27 DIAGNOSIS — Z79891 Long term (current) use of opiate analgesic: Secondary | ICD-10-CM | POA: Diagnosis not present

## 2020-03-27 DIAGNOSIS — D693 Immune thrombocytopenic purpura: Secondary | ICD-10-CM | POA: Diagnosis not present

## 2020-03-27 DIAGNOSIS — E119 Type 2 diabetes mellitus without complications: Secondary | ICD-10-CM | POA: Diagnosis not present

## 2020-03-27 DIAGNOSIS — M4807 Spinal stenosis, lumbosacral region: Secondary | ICD-10-CM | POA: Diagnosis not present

## 2020-03-27 DIAGNOSIS — E538 Deficiency of other specified B group vitamins: Secondary | ICD-10-CM | POA: Diagnosis not present

## 2020-03-27 DIAGNOSIS — I10 Essential (primary) hypertension: Secondary | ICD-10-CM | POA: Diagnosis not present

## 2020-03-27 DIAGNOSIS — E7800 Pure hypercholesterolemia, unspecified: Secondary | ICD-10-CM | POA: Diagnosis not present

## 2020-04-09 DIAGNOSIS — R69 Illness, unspecified: Secondary | ICD-10-CM | POA: Diagnosis not present

## 2020-04-19 NOTE — Progress Notes (Signed)
Cardiology Office Note  Date:  04/21/2020   ID:  Cody Price, DOB August 12, 1941, MRN [id number]  PCP:  Cody Hire, MD   Chief Complaint  Patient presents with  . other    6 month follow up. Meds reviewed by the pt. verbally. "doing well."     HPI:  Cody Price is a 79 year old gentleman with a history of  coronary artery disease,  PTCA in 1991 of his RCA that later occluded 100%,  chest pain in September 2010   stent placed to his LAD Stable since that time atrial fibrillation following back surgery  spontaneously converted , previously declined anticoagulation Diabetes, lower extremity neuropathy. s/p back surgery for lumbar spinal stenosis and DJD  unable to tolerate aspirin, GI problems 20 years ago,  stopped the metoprolol on his own as he reported having fatigue.  seen by Cody Price for low platelets, treated with prednisone Quit smoking in 1987, smoked 20 years Chronically low platelets, avg 60s to 70s presenting for routine follow-up of his coronary artery disease.  History of hematuria October 2019 Last seen in clinic March 14, 2019 History of low platelets 71, Eliquis held, was on Plavix alone Noted to be back in atrial fibrillation  Catheterization October 2019 chronic total occlusion of RCA Stent placed to mid LAD Severe apical LAD disease Moderate nonobstructive disease of left circumflex  In follow-up today, remains active 14s machinery, does gardening  Has neuropathy which limits his activities Tried numerous medications for neuropathy, none seem to help  Denies SOB, no edema Also limited by chronic back pain Previously seen by [facility] neurosurgery   urology for hematuria None recently  Lab work reviewed HBA1C 6.5 Total chol 80, LDL 30  EKG personally reviewed by myself on todays visit Atrial fibrillation with rate 83 bpm PVCs  Other past medical history reviewed hospitalization  non-STEMI 06/07/2018 Cardiac catheterization, stent to the  LAD Aspirin held given thrombocytopenia Not on beta-blocker as he has adequate rate control on calcium channel blocker for chronic atrial fibrillation Restarted on Eliquis  Cardiac catheterization June 07, 2018 1. Severe 2-vessel coronary artery disease, including 90% mid LAD stenosis involving small D1 branch just beyond old stent and chronic total occlusion of the distal RCA.  Severe apical LAD disease is stable from prior catheterization. 2. Moderate, non-obstructive disease involving the LCx. 3. Patent proximal LAD stent with mild in-stent restenosis. 4. Mildly elevated left ventricular filling pressure. 5. Successful PCI to mid LAD using Resolute Onyx 2.5 x 18 mm drug-eluting stent with 0% residual stenosis and TIMI-3 flow.  Recommendations: 1. Medical therapy for chronic total occlusion of RCA and small diagonal branch.  Other past medical hx permanent Atrial fib since 2017. Previously declined anticoagulation Was taking eliquis until 06/2018, stopped for hematuria   emergency room August 25 2014 with chest pain. Cardiac enzymes were negative  cardiac catheterization 08/26/2014.  This showed an occluded proximal RCA which was chronic, mild to moderate proximal left circumflex, moderate distal left circumflex disease of a small vessel, moderate proximal OM disease moderate size vessel, LAD proximal stent patent, normal ejection fraction. Medical management was recommended.  Cardiac catheterization September 2000 and details a 75% proximal LAD lesion, 100% RCA lesion, 30% left circumflex lesion a xience 2.75 x 15 mm DES stent was placed.  PMH:   has a past medical history of Arthritis, BPH with obstruction/lower urinary tract symptoms, CAD (coronary artery disease) (2010), Diabetes mellitus, ED (erectile dysfunction), Elevated PSA, GERD (gastroesophageal reflux disease), Gross  hematuria, H/O CT scan, Hematuria, History of hiatal hernia, History of kidney stones, HLD  (hyperlipidemia), Hypertension, Ischemic heart disease (1991), Kidney stone, Morbid obesity ([place]), Myocardial infarction [facility]), Peripheral neuropathy, Pernicious anemia, Pneumonia (1995), Pulmonary embolism ([place]), Renal cyst, Sleep apnea, and Spinal stenosis.  PSH:    Past Surgical History:  Procedure Laterality Date  . APPENDECTOMY    . BACK SURGERY     x3 back surgeries - /w fusioin, 1- [place]  . bilateral inguinal hernia repair    . cad     stent 2010  . CARDIAC CATHETERIZATION  08/17/2014  . CARPAL TUNNEL RELEASE  2014   bilateral   . CHOLECYSTECTOMY    . COLONOSCOPY WITH PROPOFOL N/A 07/11/2019   Procedure: COLONOSCOPY WITH PROPOFOL;  Surgeon: Price, Cody Pike, MD;  Location: [facility] ENDOSCOPY;  Service: Gastroenterology;  Laterality: N/A;  . CORONARY STENT INTERVENTION N/A 06/07/2018   Procedure: CORONARY STENT INTERVENTION;  Surgeon: Nelva Bush, MD;  Location: [place] [facility];  Service: Cardiovascular;  Laterality: N/A;  . ESOPHAGOGASTRODUODENOSCOPY (EGD) WITH PROPOFOL N/A 07/11/2019   Procedure: ESOPHAGOGASTRODUODENOSCOPY (EGD) WITH PROPOFOL;  Surgeon: Price, Cody Pike, MD;  Location: [facility] ENDOSCOPY;  Service: Gastroenterology;  Laterality: N/A;  . LAMINECTOMY    . LEFT HEART CATH AND CORONARY ANGIOGRAPHY N/A 06/07/2018   Procedure: LEFT HEART CATH AND CORONARY ANGIOGRAPHY;  Surgeon: Nelva Bush, MD;  Location: [place] [facility];  Service: Cardiovascular;  Laterality: N/A;    Current Outpatient Medications  Medication Sig Dispense Refill  . acetaminophen (TYLENOL) 500 MG tablet Take 500 mg by mouth as needed.     Marland Kitchen atorvastatin (LIPITOR) 40 MG tablet TAKE 1 TABLET (40 MG TOTAL) BY MOUTH DAILY AT 6 PM. 90 tablet 3  . Coenzyme Q10 (CO Q-10) 200 MG CAPS Take 200 mg by mouth daily.    . cyanocobalamin (,VITAMIN B-12,) 1000 MCG/ML injection Inject 1,000 mcg into the muscle every 30 (thirty) days.    . diazepam (VALIUM) 5 MG tablet Take 5 mg by mouth 3  (three) times daily as needed for anxiety or muscle spasms.     Marland Kitchen diltiazem (CARDIZEM CD) 180 MG 24 hr capsule Take 180 mg by mouth daily before breakfast.     . ELIQUIS 5 MG TABS tablet TAKE 1 TABLET BY MOUTH TWICE A DAY 180 tablet 2  . feeding supplement, ENSURE ENLIVE, (ENSURE ENLIVE) LIQD Take 237 mLs by mouth daily. 60 Bottle 0  . finasteride (PROSCAR) 5 MG tablet Take 1 tablet (5 mg total) by mouth daily. 90 tablet 3  . glimepiride (AMARYL) 4 MG tablet Take 4 mg by mouth 2 (two) times daily.    . hydrochlorothiazide (HYDRODIURIL) 25 MG tablet Take 25 mg by mouth daily.    Marland Kitchen lisinopril (PRINIVIL,ZESTRIL) 10 MG tablet TAKE ONE TABLET BY MOUTH ONCE DAILY (Patient taking differently: Take 10 mg by mouth daily. ) 90 tablet 2  . Multiple Vitamin (MULTIVITAMIN WITH MINERALS) TABS tablet Take 1 tablet by mouth daily. 30 tablet 0  . omega-3 acid ethyl esters (LOVAZA) 1 g capsule Take 2 g by mouth daily.    Marland Kitchen omeprazole (PRILOSEC) 40 MG capsule Take 40 mg by mouth 2 (two) times daily.    Marland Kitchen oxyCODONE-acetaminophen (PERCOCET) 10-325 MG tablet Take 1 tablet by mouth every 4 (four) hours as needed for pain.     . polyethylene glycol-electrolytes (NULYTELY/GOLYTELY) 420 g solution Take by mouth.    . sildenafil (VIAGRA) 100 MG tablet Take 100  mg by mouth daily as needed for erectile dysfunction.     . simvastatin (ZOCOR) 40 MG tablet Take 40 mg by mouth daily.    . sucralfate (CARAFATE) 1 g tablet Take 1 g by mouth 4 (four) times daily.    . tamsulosin (FLOMAX) 0.4 MG CAPS capsule Take 1 capsule (0.4 mg total) by mouth daily. 90 capsule 3  . testosterone cypionate (DEPOTESTOSTERONE CYPIONATE) 200 MG/ML injection Inject 200 mg into the muscle every 28 (twenty-eight) days.     . AMBULATORY NON FORMULARY MEDICATION Trimix (30/1/10)-(Pap/Phent/PGE)  Test Dose  44ml vial   Qty #3 Refills 0  [facility] [phone] Fax [phone] 3 mL 0  . nystatin cream (MYCOSTATIN) Apply 1 application  topically 2 (two) times daily. (Patient not taking: Reported on 04/21/2020) 30 g 0   No current facility-administered medications for this visit.     Allergies:   Novocain [procaine], Procaine hcl, Lyrica [pregabalin], Neurontin [gabapentin], Amitriptyline, Dicyclomine, Metformin, and Nitroglycerin   Social History:  The patient  reports that he quit smoking about 34 years ago. He has a 45.00 pack-year smoking history. He has quit using smokeless tobacco. He reports current alcohol use. He reports that he does not use drugs.   Family History:   family history includes Heart attack in his brother; Heart disease in his father; Heart failure in his mother; Skin cancer in his sister.    Review of Systems: Review of Systems  Constitutional: Negative.        Weakness  HENT: Negative.   Respiratory: Negative.   Cardiovascular: Negative.   Gastrointestinal: Negative.   Musculoskeletal: Positive for back pain.  Neurological: Negative.        Neuropathy lower extremities  Psychiatric/Behavioral: Negative.   All other systems reviewed and are negative.   PHYSICAL EXAM: VS:  BP 138/60 (BP Location: Left Arm, Patient Position: Sitting, Cuff Size: Normal)   Pulse 83   Ht 6\' 1"  (1.854 m)   Wt 252 lb 6 oz (114.5 kg)   SpO2 99%   BMI 33.30 kg/m  , BMI Body mass index is 33.3 kg/m.  Constitutional:  oriented to person, place, and time. No distress.  HENT:  Head: Grossly normal Eyes:  no discharge. No scleral icterus.  Neck: No JVD, no carotid bruits  Cardiovascular: Regular rate and rhythm, no murmurs appreciated Pulmonary/Chest: Clear to auscultation bilaterally, no wheezes or rails Abdominal: Soft.  no distension.  no tenderness.  Musculoskeletal: Normal range of motion Neurological:  normal muscle tone. Coordination normal. No atrophy Skin: Skin warm and dry Psychiatric: normal affect, pleasant  Recent Labs: No results found for requested labs within last 8760 hours.    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 Lab work done  through primary care Ideally needs new CBC, we will check with him Platelets were 57 in August 2020 58 in December 2020  Atherosclerosis of native coronary artery of native heart without angina pectoris Stent to his LAD  We will change to low-dose aspirin with Eliquis 5 twice daily, monitor for hematuria  Neuropathy Tried numerous medications, no pain relief Limited in his mobility   Total encounter time more than 25 minutes  Greater than 50% was spent in counseling and coordination of care with the patient    Orders Placed This Encounter  Procedures  . EKG 12-Lead     Signed, Esmond Plants, M.D., Ph.D. 04/21/2020  [address] [place], [place]

## 2020-04-21 ENCOUNTER — Ambulatory Visit: Payer: Medicare HMO | Admitting: Cardiovascular Disease

## 2020-04-21 ENCOUNTER — Encounter: Payer: Self-pay | Admitting: Cardiovascular Disease

## 2020-04-21 ENCOUNTER — Other Ambulatory Visit: Payer: Self-pay

## 2020-04-21 VITALS — BP 138/60 | HR 83 | Ht 73.0 in | Wt 252.4 lb

## 2020-04-21 DIAGNOSIS — E782 Mixed hyperlipidemia: Secondary | ICD-10-CM | POA: Diagnosis not present

## 2020-04-21 DIAGNOSIS — I4821 Permanent atrial fibrillation: Secondary | ICD-10-CM

## 2020-04-21 DIAGNOSIS — E119 Type 2 diabetes mellitus without complications: Secondary | ICD-10-CM

## 2020-04-21 DIAGNOSIS — I2510 Atherosclerotic heart disease of native coronary artery without angina pectoris: Secondary | ICD-10-CM

## 2020-04-21 DIAGNOSIS — E785 Hyperlipidemia, unspecified: Secondary | ICD-10-CM

## 2020-04-21 DIAGNOSIS — I10 Essential (primary) hypertension: Secondary | ICD-10-CM

## 2020-04-21 NOTE — Patient Instructions (Addendum)

## 2020-04-22 ENCOUNTER — Other Ambulatory Visit
Admission: RE | Admit: 2020-04-22 | Discharge: 2020-04-22 | Disposition: A | Discharge status: Home / Self Care | Payer: Medicare HMO | Source: Ambulatory Visit | Attending: Cardiovascular Disease | Admitting: Cardiovascular Disease

## 2020-04-22 ENCOUNTER — Telehealth: Payer: Self-pay | Admitting: *Deleted

## 2020-04-22 DIAGNOSIS — I25118 Atherosclerotic heart disease of native coronary artery with other forms of angina pectoris: Secondary | ICD-10-CM

## 2020-04-22 DIAGNOSIS — D649 Anemia, unspecified: Secondary | ICD-10-CM | POA: Diagnosis not present

## 2020-04-22 LAB — CBC
HCT: 34.8 % — ABNORMAL LOW (ref 39.0–52.0)
Hemoglobin: 12.3 g/dL — ABNORMAL LOW (ref 13.0–17.0)
MCH: 31.1 pg (ref 26.0–34.0)
MCHC: 35.3 g/dL (ref 30.0–36.0)
MCV: 88.1 fL (ref 80.0–100.0)
Platelets: 90 10*3/uL — ABNORMAL LOW (ref 150–400)
RBC: 3.95 MIL/uL — ABNORMAL LOW (ref 4.22–5.81)
RDW: 14.3 % (ref 11.5–15.5)
WBC: 8 10*3/uL (ref 4.0–10.5)
nRBC: 0 % (ref 0.0–0.2)

## 2020-04-22 NOTE — Telephone Encounter (Signed)
-----   Message from Minna Merritts, MD sent at 04/21/2020  6:22 PM EDT ----- Platelets were 57 end of 2020 Can we schedule him for a CBC Thx TG

## 2020-04-22 NOTE — Telephone Encounter (Signed)
Spoke with patient and reviewed that provider would like him to have some updated labs done. Instructed him to go over to the [facility] entrance and check in at registration desk and they will direct him where to go. Order entered for him to have this done and he will go later today. He was agreeable with recommendations and no further questions at this time.

## 2020-04-28 DIAGNOSIS — E538 Deficiency of other specified B group vitamins: Secondary | ICD-10-CM | POA: Diagnosis not present

## 2020-05-27 DIAGNOSIS — R69 Illness, unspecified: Secondary | ICD-10-CM | POA: Diagnosis not present

## 2020-05-30 DIAGNOSIS — E538 Deficiency of other specified B group vitamins: Secondary | ICD-10-CM | POA: Diagnosis not present

## 2020-06-24 DIAGNOSIS — E119 Type 2 diabetes mellitus without complications: Secondary | ICD-10-CM | POA: Diagnosis not present

## 2020-07-01 ENCOUNTER — Telehealth: Payer: Self-pay | Admitting: Cardiovascular Disease

## 2020-07-01 DIAGNOSIS — E119 Type 2 diabetes mellitus without complications: Secondary | ICD-10-CM | POA: Diagnosis not present

## 2020-07-01 DIAGNOSIS — I509 Heart failure, unspecified: Secondary | ICD-10-CM | POA: Diagnosis not present

## 2020-07-01 DIAGNOSIS — E538 Deficiency of other specified B group vitamins: Secondary | ICD-10-CM | POA: Diagnosis not present

## 2020-07-01 DIAGNOSIS — R69 Illness, unspecified: Secondary | ICD-10-CM | POA: Diagnosis not present

## 2020-07-01 DIAGNOSIS — Z0001 Encounter for general adult medical examination with abnormal findings: Secondary | ICD-10-CM | POA: Diagnosis not present

## 2020-07-01 DIAGNOSIS — I482 Chronic atrial fibrillation, unspecified: Secondary | ICD-10-CM | POA: Diagnosis not present

## 2020-07-01 DIAGNOSIS — Z23 Encounter for immunization: Secondary | ICD-10-CM | POA: Diagnosis not present

## 2020-07-01 DIAGNOSIS — R000 Tachycardia, unspecified: Secondary | ICD-10-CM | POA: Diagnosis not present

## 2020-07-01 NOTE — Telephone Encounter (Signed)
Spoke with patients wife and reviewed that Dr. Rockey Situ had spoke with Dr. Edwina Barth and wanted him to come in this week. Offered appointment tomorrow with Urban Gibson and confirmed date and time. She will review this with him and will make sure he gets here for that appointment. She was appreciative for the call with no further questions at this time.

## 2020-07-01 NOTE — Telephone Encounter (Signed)
Left voicemail message to call back to schedule appointment with APP this week. Dr. Rockey Situ spoke with Dr. Edwina Barth and he requested we schedule appointment with APP or him.

## 2020-07-02 ENCOUNTER — Other Ambulatory Visit: Payer: Self-pay

## 2020-07-02 ENCOUNTER — Ambulatory Visit: Payer: Medicare HMO | Admitting: Family

## 2020-07-02 ENCOUNTER — Encounter: Payer: Self-pay | Admitting: Family

## 2020-07-02 VITALS — BP 140/60 | HR 91 | Ht 73.0 in | Wt 257.0 lb

## 2020-07-02 DIAGNOSIS — I25118 Atherosclerotic heart disease of native coronary artery with other forms of angina pectoris: Secondary | ICD-10-CM | POA: Diagnosis not present

## 2020-07-02 DIAGNOSIS — E119 Type 2 diabetes mellitus without complications: Secondary | ICD-10-CM

## 2020-07-02 DIAGNOSIS — R0609 Other forms of dyspnea: Secondary | ICD-10-CM

## 2020-07-02 DIAGNOSIS — E785 Hyperlipidemia, unspecified: Secondary | ICD-10-CM

## 2020-07-02 DIAGNOSIS — I10 Essential (primary) hypertension: Secondary | ICD-10-CM

## 2020-07-02 DIAGNOSIS — R600 Localized edema: Secondary | ICD-10-CM | POA: Diagnosis not present

## 2020-07-02 DIAGNOSIS — Z7901 Long term (current) use of anticoagulants: Secondary | ICD-10-CM

## 2020-07-02 DIAGNOSIS — I4821 Permanent atrial fibrillation: Secondary | ICD-10-CM | POA: Diagnosis not present

## 2020-07-02 DIAGNOSIS — R0600 Dyspnea, unspecified: Secondary | ICD-10-CM | POA: Diagnosis not present

## 2020-07-02 MED ORDER — POTASSIUM CHLORIDE CRYS ER 20 MEQ PO TBCR
20.0000 meq | EXTENDED_RELEASE_TABLET | Freq: Every day | ORAL | 1 refills | Status: DC
Start: 2020-07-02 — End: 2020-10-06

## 2020-07-02 NOTE — Progress Notes (Signed)
Office Visit    Patient Name: Cody Price Date of Encounter: 07/03/2020  Primary Care Provider:  Baxter Hire, MD Primary Cardiologist:  Ida Rogue, MD Electrophysiologist:  None   Chief Complaint    Cody Price is a 79 y.o. male with a hx of CAD, atrial fibrillation, DM2, back surgery, thrombocytopenia, previous tobacco use presents today for shortness of breath and swelling  Past Medical History    Past Medical History:  Diagnosis Date  . Arthritis    degenerative back   . BPH with obstruction/lower urinary tract symptoms   . CAD (coronary artery disease) 2010   stent  . Diabetes mellitus   . ED (erectile dysfunction)   . Elevated PSA   . GERD (gastroesophageal reflux disease)   . Gross hematuria   . H/O CT scan    recent renal CT due to hematuria, cystoscopy - normal    . Hematuria   . History of hiatal hernia   . History of kidney stones   . HLD (hyperlipidemia)   . Hypertension   . Ischemic heart disease 1991   with angioplasty  . Kidney stone   . Morbid obesity ([place])   . Myocardial infarction ([place])   . Peripheral neuropathy   . Pernicious anemia   . Pneumonia 1995   /w PE,   . Pulmonary embolism ([place])    previous  . Renal cyst   . Sleep apnea    pt. denies sleep apnea, pt. states he has had 2 studies - last one 2011  . Spinal stenosis    Past Surgical History:  Procedure Laterality Date  . APPENDECTOMY    . BACK SURGERY     x3 back surgeries - /w fusioin, 1- [facility]  . bilateral inguinal hernia repair    . cad     stent 2010  . CARDIAC CATHETERIZATION  08/17/2014  . CARPAL TUNNEL RELEASE  2014   bilateral   . CHOLECYSTECTOMY    . COLONOSCOPY WITH PROPOFOL N/A 07/11/2019   Procedure: COLONOSCOPY WITH PROPOFOL;  Surgeon: Toledo, Benay Pike, MD;  Location: [facility];  Service: Gastroenterology;  Laterality: N/A;  . CORONARY STENT INTERVENTION N/A 06/07/2018   Procedure: CORONARY STENT INTERVENTION;  Surgeon: Nelva Bush, MD;  Location: [facility];  Service: Cardiovascular;  Laterality: N/A;  . ESOPHAGOGASTRODUODENOSCOPY (EGD) WITH PROPOFOL N/A 07/11/2019   Procedure: ESOPHAGOGASTRODUODENOSCOPY (EGD) WITH PROPOFOL;  Surgeon: Toledo, Benay Pike, MD;  Location: [facility]COPY;  Service: Gastroenterology;  Laterality: N/A;  . LAMINECTOMY    . LEFT HEART CATH AND CORONARY ANGIOGRAPHY N/A 06/07/2018   Procedure: LEFT HEART CATH AND CORONARY ANGIOGRAPHY;  Surgeon: Nelva Bush, MD;  Location: [facility];  Service: Cardiovascular;  Laterality: N/A;    Allergies  Allergies  Allergen Reactions  . Novocain [Procaine] Other (See Comments)    Syncope  . Procaine Hcl Other (See Comments)    Passes out  . Lyrica [Pregabalin]   . Neurontin [Gabapentin]   . Amitriptyline Other (See Comments)    Sleepy  . Dicyclomine Other (See Comments)    Other reaction(s): Abdominal Pain  . Metformin Nausea Only  . Nitroglycerin Other (See Comments)    Makes Blood pressure go to low.     History of Present Illness    Cody Price is a 79 y.o. male with a hx of CAD, atrial fibrillation, DM2, back surgery, thrombocytopenia, previous tobacco use last seen 04/21/2020 by Dr. Rockey Situ.  Coronary disease is s/p PTCA of RCA in 1999 which later occluded 100%.  He had stent placed to his LAD September 2010.  Cardiac catheterization October 2019 with severe two-vessel coronary disease 90% mid LAD stenosis involving small D1 branch just beyond old stent and chronic total occlusion of distal RCA.  Severe apical LAD disease stable from prior cath.  Moderate nonobstructive disease involving LCx, patent proximal LAD stent with mild in-stent restenosis, mildly elevated LV filling pressure.  He underwent successful PCI to mid LAD with DES.  Recommended for medical therapy of occlusion of RCA and small diagonal branch.  Echo at that time 06/2018 moderate focal basal hypertrophy of septum, EF 55 to 60%, Doppler  parameters consistent with high ventricular filling pressures, moderate MR, LA mildly dilated, RV normal size and function, RA mildly dilated, PASP moderately increased to 40-25 mmHg.   He has a history of statin intolerance.  Atrial fibrillation diagnosed post prior back surgery and spontaneously converted.  He was previously anticoagulated on the setting of hematuria and thrombocytopenia his Eliquis was stopped 06/14/2018.  However he had recurrent atrial fibrillation 03/12/2019 by EKG. Eliquis was resumed. As he was unaware of atrial fibrillation, rate control pursued.  When seen in clinic 10/19/2019 he was noted to be in atrial fibrillation 96 bpm.  Is also noted to be hypertensive with home readings 160/60 but declined adjustment of his blood pressure medication.  Seen by primary care provider yesterday noting edema and shortness of breath.  He was started on Lasix 40 mg daily.  Tells me he took the fluid pill last night and had significant urine output.  His lower extremity edema and breathing are improved compared to previous though not at his baseline.  They have been ongoing for 1 week. Reports mild orthopnea. Some PND over the last week for which he then goes and sits in the recliner and is able to fall back asleep. Reports productive white cough as well as wheeze.   Prefers his BP to be 160/70, tells me this is when he "feels good".   Looking forward to pig-picking this weekend.  EKGs/Labs/Other Studies Reviewed:   The following studies were reviewed today:  Cardiac catheterization June 07, 2018 1. Severe 2-vessel coronary artery disease, including 90% mid LAD stenosis involving small D1 branch just beyond old stent and chronic total occlusion of the distal RCA.  Severe apical LAD disease is stable from prior catheterization. 2. Moderate, non-obstructive disease involving the LCx. 3. Patent proximal LAD stent with mild in-stent restenosis. 4. Mildly elevated left ventricular filling  pressure. 5. Successful PCI to mid LAD using Resolute Onyx 2.5 x 18 mm drug-eluting stent with 0% residual stenosis and TIMI-3 flow.   Recommendations: 1. Medical therapy for chronic total occlusion of RCA and small diagonal branch.  EKG:  EKG is ordered today.  The ekg ordered today demonstrates atrial fibrillation 91 bpm with PVC's  Recent Labs: 04/22/2020: Hemoglobin 12.3; Platelets 90  Recent Lipid Panel    Component Value Date/Time   CHOL 69 06/08/2018 0518   CHOL 94 08/26/2014 0355   TRIG 113 06/08/2018 0518   TRIG 191 08/26/2014 0355   HDL 23 (L) 06/08/2018 0518   HDL 23 (L) 08/26/2014 0355   CHOLHDL 3.0 06/08/2018 0518   VLDL 23 06/08/2018 0518   VLDL 38 08/26/2014 0355   LDLCALC 23 06/08/2018 0518   LDLCALC 33 08/26/2014 0355    Home Medications   Current Meds  Medication Sig  . acetaminophen (TYLENOL)  500 MG tablet Take 500 mg by mouth as needed.   . AMBULATORY NON FORMULARY MEDICATION Trimix (30/1/10)-(Pap/Phent/PGE)  Test Dose  72ml vial   Qty #3 [place] [phone] Fax [phone]  . atorvastatin (LIPITOR) 40 MG tablet TAKE 1 TABLET (40 MG TOTAL) BY MOUTH DAILY AT 6 PM.  . Coenzyme Q10 (CO Q-10) 200 MG CAPS Take 200 mg by mouth daily.  . cyanocobalamin (,VITAMIN B-12,) 1000 MCG/ML injection Inject 1,000 mcg into the muscle every 30 (thirty) days.  . diazepam (VALIUM) 5 MG tablet Take 5 mg by mouth 3 (three) times daily as needed for anxiety or muscle spasms.   Marland Kitchen diltiazem (CARDIZEM CD) 180 MG 24 hr capsule Take 180 mg by mouth daily before breakfast.   . ELIQUIS 5 MG TABS tablet TAKE 1 TABLET BY MOUTH TWICE A DAY  . feeding supplement, ENSURE ENLIVE, (ENSURE ENLIVE) LIQD Take 237 mLs by mouth daily.  . finasteride (PROSCAR) 5 MG tablet Take 1 tablet (5 mg total) by mouth daily.  . furosemide (LASIX) 40 MG tablet Take 40 mg by mouth daily.  Marland Kitchen glimepiride (AMARYL) 4 MG tablet Take 4 mg by mouth 2 (two) times daily.  .  hydrochlorothiazide (HYDRODIURIL) 25 MG tablet Take 25 mg by mouth daily.  Marland Kitchen lisinopril (PRINIVIL,ZESTRIL) 10 MG tablet TAKE ONE TABLET BY MOUTH ONCE DAILY (Patient taking differently: Take 10 mg by mouth daily. )  . Multiple Vitamin (MULTIVITAMIN WITH MINERALS) TABS tablet Take 1 tablet by mouth daily.  Marland Kitchen nystatin cream (MYCOSTATIN) Apply 1 application topically 2 (two) times daily.  Marland Kitchen omega-3 acid ethyl esters (LOVAZA) 1 g capsule Take 2 g by mouth daily.  Marland Kitchen omeprazole (PRILOSEC) 40 MG capsule Take 40 mg by mouth 2 (two) times daily.  Marland Kitchen oxyCODONE-acetaminophen (PERCOCET) 10-325 MG tablet Take 1 tablet by mouth every 4 (four) hours as needed for pain.   . polyethylene glycol-electrolytes (NULYTELY/GOLYTELY) 420 g solution Take by mouth.  . sildenafil (VIAGRA) 100 MG tablet Take 100 mg by mouth daily as needed for erectile dysfunction.   . simvastatin (ZOCOR) 40 MG tablet Take 40 mg by mouth daily.  . sucralfate (CARAFATE) 1 g tablet Take 1 g by mouth 4 (four) times daily.  . tamsulosin (FLOMAX) 0.4 MG CAPS capsule Take 1 capsule (0.4 mg total) by mouth daily.  Marland Kitchen testosterone cypionate (DEPOTESTOSTERONE CYPIONATE) 200 MG/ML injection Inject 200 mg into the muscle every 28 (twenty-eight) days.      Review of Systems   Review of Systems  Constitutional: Negative for chills, fever and malaise/fatigue.  Cardiovascular: Positive for dyspnea on exertion and leg swelling. Negative for chest pain, irregular heartbeat, near-syncope, orthopnea, palpitations and syncope.  Respiratory: Positive for cough, shortness of breath, sputum production and wheezing.   Gastrointestinal: Negative for melena, nausea and vomiting.  Genitourinary: Negative for hematuria.  Neurological: Negative for dizziness, light-headedness and weakness.   All other systems reviewed and are otherwise negative except as noted above.  Physical Exam    VS:  BP 140/60 (BP Location: Left Arm, Patient Position: Sitting, Cuff Size:  Normal)   Pulse 91   Ht 6\' 1"  (1.854 m)   Wt 257 lb (116.6 kg)   BMI 33.91 kg/m  , BMI Body mass index is 33.91 kg/m.  Wt Readings from Last 3 Encounters:  07/02/20 257 lb (116.6 kg)  04/21/20 252 lb 6 oz (114.5 kg)  01/07/20 242 lb (109.8 kg)    GEN: Well nourished, well developed,  in no acute distress. HEENT: normal. Neck: Supple, no JVD, carotid bruits, or masses. Cardiac: irregularly irregular, no murmurs, rubs, or gallops. No clubbing, cyanosis, edema.  Radials/DP/PT 2+ and equal bilaterally.  Respiratory:  Respirations regular and unlabored, clear to auscultation bilaterally. GI: Soft, nontender, nondistended. MS: No deformity or atrophy. Skin: Warm and dry, no rash. Neuro:  Strength and sensation are intact. Psych: Normal affect.  Assessment & Plan    1. SOB / LE edema / weight gain - Concerning for new onset heart failure. Some improvement after single dose of Lasix 40mg  PO yesterday, continue Lasix 40mg  PO. Start Potassium 19mEq daily. Plan for echocardiogram. Discussed low salt diet and <2L fluid restriction. BMP on day of echo.  2. Permanent atrial fibrillation/chronic anticoagulation/PVC- Rate controlled by EKG today with PVC's, denies palpitations. Continue Diltiazem 180mg  dialy. May need to consider future ZIO for assessment of possible PVC mediated HF pending result of echo. Denies bleeding complications, continue Eliquis 5mg  BID for CHADS2VASC of at least 4 (age, HTN, DM2, CAD).  3. CAD-  His dyspnea is moreso consistent with HF etiology and as such, defer ischemic evaluation at this time. EKG today with no acute ST/T wave changes. GDMT includes statin. No aspirin secondary to chronic anticoagulation.   4. HLD, LDL goal less than 70- Labs 06/24/20 total cholesterol 66, HDL 26.9, LDL 24.   5. HTN- BP mildly elevated. Hesitant regarding changes to antihypertensive regimen, tells me he "feels best" at SBP 160. Defer changes until echo result.   6. DM2- A1c 06/24/20  6.6. Continue to follow with PCP.  7. Thrombocytopenia - Continue to follow with PCP.  Disposition: Follow up after echocardiogram with Dr. Rockey Situ or APP.   Signed, Loel Dubonnet, NP 07/03/2020, 8:58 AM [place]

## 2020-07-02 NOTE — Patient Instructions (Addendum)
Medication Instructions:  Your physician has recommended you make the following change in your medication:   CONTINUE Furosemide (Lasix) 40mg  ONCE daily. *This is the fluid pill started by Dr. Edwina Barth  START Potassium 20 mEq once daily *This will prevent your potassium from dropping while on the fluid pill  *If you need a refill on your cardiac medications before your next appointment, please call your pharmacy*  Lab Work: Your physician recommends that you return for lab work on day of echocardiogram for BMP, BNP  If you have labs (blood work) drawn today and your tests are completely normal, you will receive your results only by:  [place] (if you have MyChart) OR  A paper copy in the mail If you have any lab test that is abnormal or we need to change your treatment, we will call you to review the results.  Testing/Procedures: Your EKG today showed atrial fibrillation with an occasional early beat called a PVC.   Your physician has requested that you have an echocardiogram. Echocardiography is a painless test that uses sound waves to create images of your heart. It provides your doctor with information about the size and shape of your heart and how well your hearts chambers and valves are working. This procedure takes approximately one hour. There are no restrictions for this procedure.  Follow-Up: At [facility], you and your health needs are our priority.  As part of our continuing mission to provide you with exceptional heart care, we have created designated Provider Care Teams.  These Care Teams include your primary Cardiologist (physician) and Advanced Practice Providers (APPs -  Physician Assistants and Nurse Practitioners) who all work together to provide you with the care you need, when you need it.  We recommend signing up for the patient portal called "MyChart".  Sign up information is provided on this After Visit Summary.  MyChart is used to connect with patients  for Virtual Visits (Telemedicine).  Patients are able to view lab/test results, encounter notes, upcoming appointments, etc.  Non-urgent messages can be sent to your provider as well.   To learn more about what you can do with MyChart, go to [email or web address].    Your next appointment:   After echocardiogram  The format for your next appointment:   In Person  Provider:   You may see Ida Rogue, MD or one of the following Advanced Practice Providers on your designated Care Team:    Murray Hodgkins, NP  Christell Faith, PA-C  Marrianne Mood, PA-C  Cadence [place], [place]  Laurann [place], NP  Other Instructions  Continue low salt diet.   Recommend drinking less than 2 liters of fluid per day.  Recommend elevating your legs when sitting to prevent swelling.

## 2020-07-05 NOTE — Progress Notes (Signed)
9:32 AM   Cody Price [birth year] [id number]  Referring provider: Baxter Hire, MD [place],  [place] [id number]  Chief Complaint  Patient presents with  . Benign Prostatic Hypertrophy    50mofollow up    HPI: Mr. LNearis a 79year old diabetic male with a penile rash, BPH with LU TS, ED and high risk hematuria who presents today for follow up.    Penile rash Occurs occasionally.  He states nystatin makes it worse.  His wife does not want him to have a circumcision.    BPH WITH LUTS  (prostate and/or bladder) IPSS score: 10/2    Previous score: 9/2     Previous PVR: 12 mL  Major complaint(s):  Patient is on Lasix at this time.  Patient denies any modifying or aggravating factors.  Patient denies any gross hematuria, dysuria or suprapubic/flank pain.  Patient denies any fevers, chills, nausea or vomiting.     IPSS    Row Name 07/07/20 0900         International Prostate Symptom Score   How often have you had the sensation of not emptying your bladder? Not at All     How often have you had to urinate less than every two hours? About half the time     How often have you found you stopped and started again several times when you urinated? Less than half the time     How often have you found it difficult to postpone urination? Not at All     How often have you had a weak urinary stream? About half the time     How often have you had to strain to start urination? Not at All     How many times did you typically get up at night to urinate? 2 Times     Total IPSS Score 10       Quality of Life due to urinary symptoms   If you were to spend the rest of your life with your urinary condition just the way it is now how would you feel about that? Mostly Satisfied            Score:  1-7 Mild 8-19 Moderate 20-35 Severe   Erectile dysfunction Using Trimix   Testosterone deficiency Managed by his PCP with testosterone cypionate injections.  High  Risk Hematuria Former smoker.  He did admit to having an episode of gross hematuria after he had his cardiac stents placed, but he was seen in the ED and it was attributed to his anticoagulant therapy.   He did undergo a hematuria work up in 2015 with CTU and cystoscopy.  Findings positive for right kidney cyst.  He has had a recent contrast CT in 05/2019 which the cyst in the right kidney.  Cystoscopy 06/29/2019 with Dr. BErlene Quannoted friable prostatic mucosa.  CT urogram was offered, but declined.  He does endorse gross hematuria.     PMH: Past Medical History:  Diagnosis Date  . Arthritis    degenerative back   . BPH with obstruction/lower urinary tract symptoms   . CAD (coronary artery disease) 2010   stent  . Diabetes mellitus   . ED (erectile dysfunction)   . Elevated PSA   . GERD (gastroesophageal reflux disease)   . Gross hematuria   . H/O CT scan    recent renal CT due to hematuria, cystoscopy - normal    . Hematuria   .  History of hiatal hernia   . History of kidney stones   . HLD (hyperlipidemia)   . Hypertension   . Ischemic heart disease 1991   with angioplasty  . Kidney stone   . Morbid obesity ([place])   . Myocardial infarction ([place])   . Peripheral neuropathy   . Pernicious anemia   . Pneumonia 1995   /w PE,   . Pulmonary embolism ([place])    previous  . Renal cyst   . Sleep apnea    pt. denies sleep apnea, pt. states he has had 2 studies - last one 2011  . Spinal stenosis     Surgical History: Past Surgical History:  Procedure Laterality Date  . APPENDECTOMY    . BACK SURGERY     x3 back surgeries - /w fusioin, 1- [place]  . bilateral inguinal hernia repair    . cad     stent 2010  . CARDIAC CATHETERIZATION  08/17/2014  . CARPAL TUNNEL RELEASE  2014   bilateral   . CHOLECYSTECTOMY    . COLONOSCOPY WITH PROPOFOL N/A 07/11/2019   Procedure: COLONOSCOPY WITH PROPOFOL;  Surgeon: Toledo, Benay Pike, MD;  Location: [facility] ENDOSCOPY;  Service:  Gastroenterology;  Laterality: N/A;  . CORONARY STENT INTERVENTION N/A 06/07/2018   Procedure: CORONARY STENT INTERVENTION;  Surgeon: Nelva Bush, MD;  Location: [facility];  Service: Cardiovascular;  Laterality: N/A;  . ESOPHAGOGASTRODUODENOSCOPY (EGD) WITH PROPOFOL N/A 07/11/2019   Procedure: ESOPHAGOGASTRODUODENOSCOPY (EGD) WITH PROPOFOL;  Surgeon: Toledo, Benay Pike, MD;  Location: [facility] ENDOSCOPY;  Service: Gastroenterology;  Laterality: N/A;  . LAMINECTOMY    . LEFT HEART CATH AND CORONARY ANGIOGRAPHY N/A 06/07/2018   Procedure: LEFT HEART CATH AND CORONARY ANGIOGRAPHY;  Surgeon: Nelva Bush, MD;  Location: [place] [facility];  Service: Cardiovascular;  Laterality: N/A;    Home Medications:  Allergies as of 07/07/2020      Reactions   Novocain [procaine] Other (See Comments)   Syncope   Procaine Hcl Other (See Comments)   Passes out   Lyrica [pregabalin]    Neurontin [gabapentin]    Amitriptyline Other (See Comments)   Sleepy   Dicyclomine Other (See Comments)   Other reaction(s): Abdominal Pain   Metformin Nausea Only   Nitroglycerin Other (See Comments)   Makes Blood pressure go to low.       Medication List       Accurate as of July 07, 2020  9:32 AM. If you have any questions, ask your nurse or doctor.        STOP taking these medications   hydrochlorothiazide 25 MG tablet Commonly known as: HYDRODIURIL Stopped by: Levis Nazir, PA-C     TAKE these medications   acetaminophen 500 MG tablet Commonly known as: TYLENOL Take 500 mg by mouth as needed.   AMBULATORY NON FORMULARY MEDICATION Trimix (30/1/10)-(Pap/Phent/PGE)  Test Dose  64m vial   Qty #3 [id number] [phone] [phone]  atorvastatin 40 MG tablet Commonly known as: LIPITOR TAKE 1 TABLET (40 MG TOTAL) BY MOUTH DAILY AT 6 PM.   Co Q-10 200 MG Caps Take 200 mg by mouth daily.   cyanocobalamin 1000 MCG/ML injection Commonly known as:  (VITAMIN B-12) Inject 1,000 mcg into the muscle every 30 (thirty) days.   diltiazem 180 MG 24 hr capsule Commonly known as: CARDIZEM CD Take 180 mg by mouth daily before breakfast.   Eliquis 5 MG Tabs tablet Generic drug: apixaban  TAKE 1 TABLET BY MOUTH TWICE A DAY   feeding supplement Liqd Take 237 mLs by mouth daily.   finasteride 5 MG tablet Commonly known as: Proscar Take 1 tablet (5 mg total) by mouth daily.   furosemide 40 MG tablet Commonly known as: LASIX Take 40 mg by mouth daily.   glimepiride 4 MG tablet Commonly known as: AMARYL Take 4 mg by mouth 2 (two) times daily.   lisinopril 10 MG tablet Commonly known as: ZESTRIL TAKE ONE TABLET BY MOUTH ONCE DAILY   multivitamin with minerals Tabs tablet Take 1 tablet by mouth daily.   nystatin cream Commonly known as: MYCOSTATIN Apply 1 application topically 2 (two) times daily.   omega-3 acid ethyl esters 1 g capsule Commonly known as: LOVAZA Take 2 g by mouth daily.   omeprazole 40 MG capsule Commonly known as: PRILOSEC Take 40 mg by mouth 2 (two) times daily.   oxyCODONE-acetaminophen 10-325 MG tablet Commonly known as: PERCOCET Take 1 tablet by mouth every 4 (four) hours as needed for pain.   polyethylene glycol-electrolytes 420 g solution Commonly known as: NuLYTELY Take by mouth.   potassium chloride SA 20 MEQ tablet Commonly known as: KLOR-CON Take 1 tablet (20 mEq total) by mouth daily.   simvastatin 40 MG tablet Commonly known as: ZOCOR Take 40 mg by mouth daily.   sucralfate 1 g tablet Commonly known as: CARAFATE Take 1 g by mouth 4 (four) times daily.   tamsulosin 0.4 MG Caps capsule Commonly known as: FLOMAX Take 1 capsule (0.4 mg total) by mouth daily.   testosterone cypionate 200 MG/ML injection Commonly known as: DEPOTESTOSTERONE CYPIONATE Inject 200 mg into the muscle every 28 (twenty-eight) days.   Valium 5 MG tablet Generic drug: diazepam Take 5 mg by mouth 3 (three)  times daily as needed for anxiety or muscle spasms.   Viagra 100 MG tablet Generic drug: sildenafil Take 100 mg by mouth daily as needed for erectile dysfunction.       Allergies:  Allergies  Allergen Reactions  . Novocain [Procaine] Other (See Comments)    Syncope  . Procaine Hcl Other (See Comments)    Passes out  . Lyrica [Pregabalin]   . Neurontin [Gabapentin]   . Amitriptyline Other (See Comments)    Sleepy  . Dicyclomine Other (See Comments)    Other reaction(s): Abdominal Pain  . Metformin Nausea Only  . Nitroglycerin Other (See Comments)    Makes Blood pressure go to low.     Family History: Family History  Problem Relation Age of Onset  . Heart failure Mother   . Heart disease Father   . Heart attack Brother   . Skin cancer Sister   . Kidney disease Neg Hx   . Prostate cancer Neg Hx   . Kidney cancer Neg Hx   . Bladder Cancer Neg Hx     Social History:  reports that he quit smoking about 34 years ago. He has a 45.00 pack-year smoking history. He has quit using smokeless tobacco. He reports current alcohol use. He reports that he does not use drugs.  ROS: For pertinent review of systems please refer to history of present illness  Physical Exam: BP 123/69   Pulse 94   Ht 6' (1.829 m)   Wt 252 lb (114.3 kg)   BMI 34.18 kg/m   Constitutional:  Well nourished. Alert and oriented, No acute distress. HEENT: [place] AT, mask in place.  Trachea midline Cardiovascular: No clubbing, cyanosis, or  edema. Respiratory: Normal respiratory effort, no increased work of breathing. GU: No CVA tenderness.  No bladder fullness or masses.  Patient with uncircumcised phallus.   Foreskin easily retracted Urethral meatus is patent.  No penile discharge. No penile lesions or rashes. Scrotum without lesions, cysts, rashes and/or edema.  Testicles are located scrotally bilaterally. No masses are appreciated in the testicles. Left and right epididymis are normal. Rectal: Patient with   normal sphincter tone. Anus and perineum without scarring or rashes. No rectal masses are appreciated. Prostate is approximately 50 grams, no nodules are appreciated. Seminal vesicles could not be palpated Skin: No rashes, bruises or suspicious lesions. Lymph: No inguinal adenopathy. Neurologic: Grossly intact, no focal deficits, moving all 4 extremities. Psychiatric: Normal mood and affect.  Laboratory Data: Specimen:  Blood  Ref Range & Units 11 d ago  Glucose 70 - 110 mg/dL 165High      Sodium 136 - 145 mmol/L 141      Potassium 3.6 - 5.1 mmol/L 3.7      Chloride 97 - 109 mmol/L 102      Carbon Dioxide (CO2) 22.0 - 32.0 mmol/L 31.0      Urea Nitrogen (BUN) 7 - 25 mg/dL 14      Creatinine 0.7 - 1.3 mg/dL 0.8      Glomerular Filtration Rate (eGFR), MDRD Estimate >60 mL/min/1.73sq m 93      Calcium 8.7 - 10.3 mg/dL 9.0      AST  8 - 39 U/L 11      ALT  6 - 57 U/L 11      Alk Phos (alkaline Phosphatase) 34 - 104 U/L 63      Albumin 3.5 - 4.8 g/dL 4.3      Bilirubin, Total 0.3 - 1.2 mg/dL 1.3High      Protein, Total 6.1 - 7.9 g/dL 6.3      A/G Ratio 1.0 - 5.0 gm/dL 2.2      Resulting Agency  [facility] - LAB  Specimen Collected: 06/24/20 8:03 AM Last Resulted: 06/24/20 12:10 PM  Received From: [facility]  Result Received: 07/01/20 2:24 PM   Lab Results  Component Value Date   WBC 8.0 04/22/2020   HGB 12.3 (L) 04/22/2020   HCT 34.8 (L) 04/22/2020   MCV 88.1 04/22/2020   PLT 90 (L) 04/22/2020   Specimen:  Blood  Ref Range & Units 11 d ago Comments  WBC (White Blood Cell Count) 4.1 - 10.2 10^3/uL 6.0        RBC (Red Blood Cell Count) 4.69 - 6.13 10^6/uL 3.88Low        Hemoglobin 14.1 - 18.1 gm/dL 12.0Low        Hematocrit 40.0 - 52.0 % 35.9Low        MCV (Mean Corpuscular Volume) 80.0 - 100.0 fl 92.5        MCH (Mean Corpuscular Hemoglobin) 27.0 - 31.2 pg 30.9        MCHC (Mean Corpuscular Hemoglobin Concentration)  32.0 - 36.0 gm/dL 33.4        Platelet Count 150 - 450 10^3/uL 71Low  Results verified by repeat testing,Smear review agrees with analyzer results,Results consistent with patient history      RDW-CV (Red Cell Distribution Width) 11.6 - 14.8 % 14.6        MPV (Mean Platelet Volume) 9.4 - 12.4 fl 12.4        Neutrophils 1.50 - 7.80 10^3/uL 2.72  Lymphocytes 1.00 - 3.60 10^3/uL 1.31        Monocytes 0.00 - 1.50 10^3/uL 1.83High        Eosinophils 0.00 - 0.55 10^3/uL 0.04        Basophils 0.00 - 0.09 10^3/uL 0.01        Neutrophil % 32.0 - 70.0 % 45.4        Lymphocyte % 10.0 - 50.0 % 21.9        Monocyte % 4.0 - 13.0 % 30.6High        Eosinophil % 1.0 - 5.0 % 0.7Low        Basophil% 0.0 - 2.0 % 0.2        Immature Granulocyte % <=0.7 % 1.2High        Immature Granulocyte Count <=0.06 10^3/L 0.[place] - LAB   Specimen Collected: 06/24/20 8:03 AM Last Resulted: 06/24/20 12:44 PM  Received From: [facility]  Result Received: 07/01/20 2:24 PM   Specimen:  Blood  Ref Range & Units 11 d ago  Hemoglobin A1C 4.2 - 5.6 % 6.6High      Average Blood Glucose (Calc) mg/dL [facility] - LAB  Narrative Performed by [facility] - [facility] Normal Range:  4.2 - 5.6%  Increased Risk: 5.7 - 6.4%  Diabetes:    >= 6.5%  Glycemic Control for adults with diabetes: <7%  Specimen Collected: 06/24/20 8:03 AM Last Resulted: 06/24/20 12:32 PM  Received From: [facility]  Result Received: 07/01/20 2:24 PM   Specimen:  Urine  Ref Range & Units 11 d ago  Color Yellow  Yellow      Clarity Clear  Clear      Specific Gravity 1.000 - 1.030  1.025      pH, Urine 5.0 - 8.0  6.5      Protein, Urinalysis Negative, Trace mg/dL Negative      Glucose, Urinalysis Negative mg/dL Negative      Ketones, Urinalysis Negative mg/dL Negative        Blood, Urinalysis Negative  Negative      Nitrite, Urinalysis Negative  Negative      Leukocyte Esterase, Urinalysis Negative  Negative      White Blood Cells, Urinalysis None Seen, 0-3 /hpf 0-3      Red Blood Cells, Urinalysis None Seen, 0-3 /hpf None Seen      Bacteria, Urinalysis None Seen /hpf None Seen      Squamous Epithelial Cells, Urinalysis Rare, Few, None Seen /hpf None Seen      Resulting Agency  [facility] - [facility]  Specimen Collected: 06/24/20 8:03 AM Last Resulted: 06/24/20 11:54 AM  Received From: [facility]  Result Received: 07/01/20 2:24 PM     I have reviewed the labs.  Pertinent Imaging Results for ZAVIAN, SLOWEY (MRN [id number]) as of 12/28/2019 09:12  Ref. Range 12/24/2019 15:13  Scan Result Unknown 12     Assessment & Plan:    1. High risk hematuria Hematuria work up completed in 06/2019 - findings positive for friable prostate No report of gross hematuria  RTC in one year- atient to report any gross hematuria in the interim    2. Balanitis Controlled with the Nystatin cream Patient's wife does not want him to be circumcised   3. BPH with LUTS IPSS score is  10/2, it is stable  Continue conservative management, avoiding bladder irritants and timed voiding's Frequency and painful urination Patient would like to discontinue his tamsulosin and finasteride at this time and I advised him to restart if his urinary symptoms worsen   4. ED Has not had an opportunity to use the Trimix    Return in about 1 year (around 07/07/2021) for IPSS, PVR and exam.  These notes generated with voice recognition software. I apologize for typographical errors.  Zara Council, PA-C  [facility] [address] [place], [place] [address] [phone]

## 2020-07-07 ENCOUNTER — Encounter: Payer: Self-pay | Admitting: Urology

## 2020-07-07 ENCOUNTER — Ambulatory Visit: Payer: Medicare HMO | Admitting: Urology

## 2020-07-07 ENCOUNTER — Other Ambulatory Visit: Payer: Self-pay

## 2020-07-07 VITALS — BP 123/69 | HR 94 | Ht 72.0 in | Wt 252.0 lb

## 2020-07-07 DIAGNOSIS — N401 Enlarged prostate with lower urinary tract symptoms: Secondary | ICD-10-CM | POA: Diagnosis not present

## 2020-07-07 DIAGNOSIS — N138 Other obstructive and reflux uropathy: Secondary | ICD-10-CM | POA: Diagnosis not present

## 2020-07-07 DIAGNOSIS — R319 Hematuria, unspecified: Secondary | ICD-10-CM

## 2020-07-08 ENCOUNTER — Other Ambulatory Visit: Payer: Self-pay

## 2020-07-08 ENCOUNTER — Inpatient Hospital Stay (HOSPITAL_COMMUNITY)
Admit: 2020-07-08 | Discharge: 2020-07-08 | Disposition: A | Discharge status: Home / Self Care | Payer: Medicare HMO | Attending: Internal Medicine | Admitting: Internal Medicine

## 2020-07-08 ENCOUNTER — Emergency Department: Payer: Medicare HMO

## 2020-07-08 ENCOUNTER — Inpatient Hospital Stay
Admission: EM | Admit: 2020-07-08 | Discharge: 2020-07-12 | DRG: 246 | Disposition: A | Discharge status: Home / Self Care | Payer: Medicare HMO | Attending: Internal Medicine | Admitting: Internal Medicine

## 2020-07-08 DIAGNOSIS — R0789 Other chest pain: Secondary | ICD-10-CM | POA: Diagnosis not present

## 2020-07-08 DIAGNOSIS — Z79899 Other long term (current) drug therapy: Secondary | ICD-10-CM

## 2020-07-08 DIAGNOSIS — I110 Hypertensive heart disease with heart failure: Principal | ICD-10-CM | POA: Diagnosis present

## 2020-07-08 DIAGNOSIS — E119 Type 2 diabetes mellitus without complications: Secondary | ICD-10-CM

## 2020-07-08 DIAGNOSIS — N400 Enlarged prostate without lower urinary tract symptoms: Secondary | ICD-10-CM | POA: Diagnosis not present

## 2020-07-08 DIAGNOSIS — Z87891 Personal history of nicotine dependence: Secondary | ICD-10-CM

## 2020-07-08 DIAGNOSIS — I480 Paroxysmal atrial fibrillation: Secondary | ICD-10-CM | POA: Diagnosis not present

## 2020-07-08 DIAGNOSIS — Z20822 Contact with and (suspected) exposure to covid-19: Secondary | ICD-10-CM | POA: Diagnosis not present

## 2020-07-08 DIAGNOSIS — Z86711 Personal history of pulmonary embolism: Secondary | ICD-10-CM

## 2020-07-08 DIAGNOSIS — Z66 Do not resuscitate: Secondary | ICD-10-CM | POA: Diagnosis not present

## 2020-07-08 DIAGNOSIS — I5031 Acute diastolic (congestive) heart failure: Secondary | ICD-10-CM

## 2020-07-08 DIAGNOSIS — I517 Cardiomegaly: Secondary | ICD-10-CM | POA: Diagnosis not present

## 2020-07-08 DIAGNOSIS — Z6833 Body mass index (BMI) 33.0-33.9, adult: Secondary | ICD-10-CM

## 2020-07-08 DIAGNOSIS — I493 Ventricular premature depolarization: Secondary | ICD-10-CM | POA: Diagnosis present

## 2020-07-08 DIAGNOSIS — E1159 Type 2 diabetes mellitus with other circulatory complications: Secondary | ICD-10-CM | POA: Diagnosis not present

## 2020-07-08 DIAGNOSIS — I340 Nonrheumatic mitral (valve) insufficiency: Secondary | ICD-10-CM | POA: Diagnosis present

## 2020-07-08 DIAGNOSIS — E785 Hyperlipidemia, unspecified: Secondary | ICD-10-CM | POA: Diagnosis present

## 2020-07-08 DIAGNOSIS — I4811 Longstanding persistent atrial fibrillation: Secondary | ICD-10-CM | POA: Diagnosis not present

## 2020-07-08 DIAGNOSIS — I10 Essential (primary) hypertension: Secondary | ICD-10-CM | POA: Diagnosis not present

## 2020-07-08 DIAGNOSIS — I255 Ischemic cardiomyopathy: Secondary | ICD-10-CM | POA: Diagnosis present

## 2020-07-08 DIAGNOSIS — I25118 Atherosclerotic heart disease of native coronary artery with other forms of angina pectoris: Secondary | ICD-10-CM | POA: Diagnosis present

## 2020-07-08 DIAGNOSIS — R17 Unspecified jaundice: Secondary | ICD-10-CM | POA: Diagnosis present

## 2020-07-08 DIAGNOSIS — E1142 Type 2 diabetes mellitus with diabetic polyneuropathy: Secondary | ICD-10-CM | POA: Diagnosis present

## 2020-07-08 DIAGNOSIS — I25119 Atherosclerotic heart disease of native coronary artery with unspecified angina pectoris: Secondary | ICD-10-CM | POA: Diagnosis not present

## 2020-07-08 DIAGNOSIS — I272 Pulmonary hypertension, unspecified: Secondary | ICD-10-CM | POA: Diagnosis present

## 2020-07-08 DIAGNOSIS — I5033 Acute on chronic diastolic (congestive) heart failure: Secondary | ICD-10-CM | POA: Diagnosis present

## 2020-07-08 DIAGNOSIS — Z7901 Long term (current) use of anticoagulants: Secondary | ICD-10-CM

## 2020-07-08 DIAGNOSIS — Z884 Allergy status to anesthetic agent status: Secondary | ICD-10-CM

## 2020-07-08 DIAGNOSIS — R0602 Shortness of breath: Secondary | ICD-10-CM | POA: Diagnosis not present

## 2020-07-08 DIAGNOSIS — I252 Old myocardial infarction: Secondary | ICD-10-CM | POA: Diagnosis not present

## 2020-07-08 DIAGNOSIS — D693 Immune thrombocytopenic purpura: Secondary | ICD-10-CM | POA: Diagnosis present

## 2020-07-08 DIAGNOSIS — I2510 Atherosclerotic heart disease of native coronary artery without angina pectoris: Secondary | ICD-10-CM

## 2020-07-08 DIAGNOSIS — J90 Pleural effusion, not elsewhere classified: Secondary | ICD-10-CM | POA: Diagnosis not present

## 2020-07-08 DIAGNOSIS — I4821 Permanent atrial fibrillation: Secondary | ICD-10-CM | POA: Diagnosis not present

## 2020-07-08 DIAGNOSIS — K219 Gastro-esophageal reflux disease without esophagitis: Secondary | ICD-10-CM | POA: Diagnosis not present

## 2020-07-08 DIAGNOSIS — D649 Anemia, unspecified: Secondary | ICD-10-CM | POA: Diagnosis present

## 2020-07-08 DIAGNOSIS — I5043 Acute on chronic combined systolic (congestive) and diastolic (congestive) heart failure: Secondary | ICD-10-CM | POA: Diagnosis not present

## 2020-07-08 DIAGNOSIS — I4891 Unspecified atrial fibrillation: Secondary | ICD-10-CM | POA: Diagnosis present

## 2020-07-08 DIAGNOSIS — I2582 Chronic total occlusion of coronary artery: Secondary | ICD-10-CM | POA: Diagnosis present

## 2020-07-08 DIAGNOSIS — I444 Left anterior fascicular block: Secondary | ICD-10-CM | POA: Diagnosis present

## 2020-07-08 DIAGNOSIS — Z808 Family history of malignant neoplasm of other organs or systems: Secondary | ICD-10-CM

## 2020-07-08 DIAGNOSIS — Z79890 Hormone replacement therapy (postmenopausal): Secondary | ICD-10-CM

## 2020-07-08 DIAGNOSIS — Z888 Allergy status to other drugs, medicaments and biological substances status: Secondary | ICD-10-CM

## 2020-07-08 DIAGNOSIS — Z7984 Long term (current) use of oral hypoglycemic drugs: Secondary | ICD-10-CM

## 2020-07-08 DIAGNOSIS — E876 Hypokalemia: Secondary | ICD-10-CM | POA: Diagnosis present

## 2020-07-08 DIAGNOSIS — Z8249 Family history of ischemic heart disease and other diseases of the circulatory system: Secondary | ICD-10-CM

## 2020-07-08 DIAGNOSIS — Z8719 Personal history of other diseases of the digestive system: Secondary | ICD-10-CM

## 2020-07-08 DIAGNOSIS — R079 Chest pain, unspecified: Principal | ICD-10-CM

## 2020-07-08 DIAGNOSIS — E669 Obesity, unspecified: Secondary | ICD-10-CM | POA: Diagnosis present

## 2020-07-08 DIAGNOSIS — Z951 Presence of aortocoronary bypass graft: Secondary | ICD-10-CM | POA: Diagnosis not present

## 2020-07-08 LAB — ECHOCARDIOGRAM COMPLETE
AR max vel: 2.43 cm2
AV Area VTI: 3.6 cm2
AV Area mean vel: 2.68 cm2
AV Mean grad: 2.5 mmHg
AV Peak grad: 4.4 mmHg
Ao pk vel: 1.04 m/s
Area-P 1/2: 4.21 cm2
Height: 73 in
S' Lateral: 3.69 cm
Weight: 4048 oz

## 2020-07-08 LAB — CBC WITH DIFFERENTIAL/PLATELET
Abs Immature Granulocytes: 0.1 10*3/uL — ABNORMAL HIGH (ref 0.00–0.07)
Basophils Absolute: 0 10*3/uL (ref 0.0–0.1)
Basophils Relative: 0 %
Eosinophils Absolute: 0 10*3/uL (ref 0.0–0.5)
Eosinophils Relative: 1 %
HCT: 36.5 % — ABNORMAL LOW (ref 39.0–52.0)
Hemoglobin: 12.1 g/dL — ABNORMAL LOW (ref 13.0–17.0)
Immature Granulocytes: 2 %
Lymphocytes Relative: 10 %
Lymphs Abs: 0.6 10*3/uL — ABNORMAL LOW (ref 0.7–4.0)
MCH: 30 pg (ref 26.0–34.0)
MCHC: 33.2 g/dL (ref 30.0–36.0)
MCV: 90.6 fL (ref 80.0–100.0)
Monocytes Absolute: 2.1 10*3/uL — ABNORMAL HIGH (ref 0.1–1.0)
Monocytes Relative: 34 %
Neutro Abs: 3.4 10*3/uL (ref 1.7–7.7)
Neutrophils Relative %: 53 %
Platelets: 78 10*3/uL — ABNORMAL LOW (ref 150–400)
RBC: 4.03 MIL/uL — ABNORMAL LOW (ref 4.22–5.81)
RDW: 15.1 % (ref 11.5–15.5)
WBC: 6.3 10*3/uL (ref 4.0–10.5)
nRBC: 0 % (ref 0.0–0.2)

## 2020-07-08 LAB — TROPONIN I (HIGH SENSITIVITY)
Troponin I (High Sensitivity): 8 ng/L (ref <18)
Troponin I (High Sensitivity): 21 ng/L — ABNORMAL HIGH (ref <18)
Troponin I (High Sensitivity): 23 ng/L — ABNORMAL HIGH (ref <18)

## 2020-07-08 LAB — HEMOGLOBIN A1C
Hgb A1c MFr Bld: 6.5 % — ABNORMAL HIGH (ref 4.8–5.6)
Mean Plasma Glucose: 139.85 mg/dL

## 2020-07-08 LAB — COMPREHENSIVE METABOLIC PANEL
ALT: 16 U/L (ref 0–44)
AST: 15 U/L (ref 15–41)
Albumin: 4 g/dL (ref 3.5–5.0)
Alkaline Phosphatase: 51 U/L (ref 38–126)
Anion gap: 10 (ref 5–15)
BUN: 13 mg/dL (ref 8–23)
CO2: 25 mmol/L (ref 22–32)
Calcium: 8.6 mg/dL — ABNORMAL LOW (ref 8.9–10.3)
Chloride: 104 mmol/L (ref 98–111)
Creatinine, Ser: 0.87 mg/dL (ref 0.61–1.24)
GFR, Estimated: >60 mL/min (ref >60)
Glucose, Bld: 192 mg/dL — ABNORMAL HIGH (ref 70–99)
Potassium: 3.5 mmol/L (ref 3.5–5.1)
Sodium: 139 mmol/L (ref 135–145)
Total Bilirubin: 1.9 mg/dL — ABNORMAL HIGH (ref 0.3–1.2)
Total Protein: 6.7 g/dL (ref 6.5–8.1)

## 2020-07-08 LAB — MAGNESIUM: Magnesium: 1.6 mg/dL — ABNORMAL LOW (ref 1.7–2.4)

## 2020-07-08 LAB — BRAIN NATRIURETIC PEPTIDE: B Natriuretic Peptide: 336.2 pg/mL — ABNORMAL HIGH (ref 0.0–100.0)

## 2020-07-08 LAB — TSH: TSH: 1.197 u[IU]/mL (ref 0.350–4.500)

## 2020-07-08 LAB — RESPIRATORY PANEL BY RT PCR (FLU A&B, COVID)
Influenza A by PCR: NEGATIVE
Influenza B by PCR: NEGATIVE
SARS Coronavirus 2 by RT PCR: NEGATIVE

## 2020-07-08 LAB — CBG MONITORING, ED: Glucose-Capillary: 148 mg/dL — ABNORMAL HIGH (ref 70–99)

## 2020-07-08 LAB — GLUCOSE, CAPILLARY: Glucose-Capillary: 149 mg/dL — ABNORMAL HIGH (ref 70–99)

## 2020-07-08 MED ORDER — ADULT MULTIVITAMIN W/MINERALS CH
1.0000 | ORAL_TABLET | Freq: Every day | ORAL | Status: DC
  Administered 2020-07-08 – 2020-07-12 (×5): 1 via ORAL
  Filled 2020-07-08 (×5): qty 1

## 2020-07-08 MED ORDER — OXYCODONE-ACETAMINOPHEN 10-325 MG PO TABS: 1.0000 | ORAL_TABLET | ORAL | Status: DC | PRN

## 2020-07-08 MED ORDER — DIAZEPAM 5 MG PO TABS
5.0000 mg | ORAL_TABLET | Freq: Once | ORAL | Status: AC
  Administered 2020-07-08: 5 mg via ORAL
  Filled 2020-07-08: qty 1

## 2020-07-08 MED ORDER — OXYCODONE-ACETAMINOPHEN 5-325 MG PO TABS
1.0000 | ORAL_TABLET | ORAL | Status: DC | PRN
  Administered 2020-07-09 – 2020-07-12 (×11): 1 via ORAL
  Filled 2020-07-08 (×11): qty 1

## 2020-07-08 MED ORDER — FUROSEMIDE 10 MG/ML IJ SOLN
60.0000 mg | Freq: Once | INTRAMUSCULAR | Status: AC
  Administered 2020-07-08: 60 mg via INTRAVENOUS
  Filled 2020-07-08: qty 8

## 2020-07-08 MED ORDER — INSULIN ASPART 100 UNIT/ML ~~LOC~~ SOLN
0.0000 [IU] | Freq: Three times a day (TID) | SUBCUTANEOUS | Status: DC
  Administered 2020-07-08: 2 [IU] via SUBCUTANEOUS
  Administered 2020-07-09: 3 [IU] via SUBCUTANEOUS
  Administered 2020-07-09: 2 [IU] via SUBCUTANEOUS
  Administered 2020-07-09 – 2020-07-10 (×3): 3 [IU] via SUBCUTANEOUS
  Administered 2020-07-11: 2 [IU] via SUBCUTANEOUS
  Administered 2020-07-12: 3 [IU] via SUBCUTANEOUS
  Filled 2020-07-08 (×8): qty 1

## 2020-07-08 MED ORDER — POTASSIUM CHLORIDE CRYS ER 20 MEQ PO TBCR
30.0000 meq | EXTENDED_RELEASE_TABLET | Freq: Once | ORAL | Status: AC
  Administered 2020-07-08: 30 meq via ORAL
  Filled 2020-07-08: qty 2

## 2020-07-08 MED ORDER — ACETAMINOPHEN 325 MG PO TABS
650.0000 mg | ORAL_TABLET | ORAL | Status: DC | PRN
  Filled 2020-07-08: qty 2

## 2020-07-08 MED ORDER — OMEGA-3-ACID ETHYL ESTERS 1 G PO CAPS
2.0000 g | ORAL_CAPSULE | Freq: Every day | ORAL | Status: DC
  Administered 2020-07-08 – 2020-07-12 (×5): 2 g via ORAL
  Filled 2020-07-08 (×5): qty 2

## 2020-07-08 MED ORDER — ASPIRIN EC 81 MG PO TBEC
81.0000 mg | DELAYED_RELEASE_TABLET | Freq: Every day | ORAL | Status: DC
  Administered 2020-07-09: 81 mg via ORAL
  Filled 2020-07-08 (×2): qty 1

## 2020-07-08 MED ORDER — LISINOPRIL 10 MG PO TABS
10.0000 mg | ORAL_TABLET | Freq: Every day | ORAL | Status: DC
  Administered 2020-07-09 – 2020-07-12 (×4): 10 mg via ORAL
  Filled 2020-07-08 (×4): qty 1

## 2020-07-08 MED ORDER — DILTIAZEM HCL ER COATED BEADS 180 MG PO CP24
180.0000 mg | ORAL_CAPSULE | Freq: Once | ORAL | Status: AC
  Administered 2020-07-08: 180 mg via ORAL
  Filled 2020-07-08: qty 1

## 2020-07-08 MED ORDER — HEPARIN (PORCINE) 25000 UT/250ML-% IV SOLN
1700.0000 [IU]/h | INTRAVENOUS | Status: DC
  Administered 2020-07-08: 1400 [IU]/h via INTRAVENOUS
  Administered 2020-07-09 – 2020-07-11 (×3): 1600 [IU]/h via INTRAVENOUS
  Filled 2020-07-08 (×5): qty 250

## 2020-07-08 MED ORDER — POTASSIUM CHLORIDE CRYS ER 20 MEQ PO TBCR
20.0000 meq | EXTENDED_RELEASE_TABLET | Freq: Every day | ORAL | Status: DC
  Administered 2020-07-08 – 2020-07-11 (×4): 20 meq via ORAL
  Filled 2020-07-08 (×4): qty 1

## 2020-07-08 MED ORDER — FUROSEMIDE 10 MG/ML IJ SOLN
40.0000 mg | Freq: Two times a day (BID) | INTRAMUSCULAR | Status: DC
  Administered 2020-07-08 – 2020-07-10 (×5): 40 mg via INTRAVENOUS
  Filled 2020-07-08 (×5): qty 4

## 2020-07-08 MED ORDER — ATORVASTATIN CALCIUM 20 MG PO TABS
40.0000 mg | ORAL_TABLET | Freq: Every day | ORAL | Status: DC
  Administered 2020-07-08 – 2020-07-11 (×4): 40 mg via ORAL
  Filled 2020-07-08 (×5): qty 2

## 2020-07-08 MED ORDER — OXYCODONE HCL 5 MG PO TABS
5.0000 mg | ORAL_TABLET | ORAL | Status: DC | PRN
  Administered 2020-07-08 – 2020-07-12 (×9): 5 mg via ORAL
  Filled 2020-07-08 (×9): qty 1

## 2020-07-08 MED ORDER — LISINOPRIL 10 MG PO TABS
10.0000 mg | ORAL_TABLET | Freq: Once | ORAL | Status: AC
  Administered 2020-07-08: 10 mg via ORAL
  Filled 2020-07-08: qty 1

## 2020-07-08 MED ORDER — SODIUM CHLORIDE 0.9% FLUSH
3.0000 mL | Freq: Two times a day (BID) | INTRAVENOUS | Status: DC
  Administered 2020-07-08 – 2020-07-12 (×8): 3 mL via INTRAVENOUS

## 2020-07-08 MED ORDER — FUROSEMIDE 10 MG/ML IJ SOLN: 40.0000 mg | Freq: Two times a day (BID) | INTRAMUSCULAR | Status: DC

## 2020-07-08 MED ORDER — APIXABAN 5 MG PO TABS: 5.0000 mg | ORAL_TABLET | Freq: Two times a day (BID) | ORAL | Status: DC

## 2020-07-08 MED ORDER — DILTIAZEM HCL ER COATED BEADS 180 MG PO CP24
180.0000 mg | ORAL_CAPSULE | Freq: Every day | ORAL | Status: DC
  Administered 2020-07-09: 180 mg via ORAL
  Filled 2020-07-08 (×2): qty 1

## 2020-07-08 MED ORDER — TAMSULOSIN HCL 0.4 MG PO CAPS
0.4000 mg | ORAL_CAPSULE | Freq: Every day | ORAL | Status: DC
  Administered 2020-07-08 – 2020-07-12 (×5): 0.4 mg via ORAL
  Filled 2020-07-08 (×5): qty 1

## 2020-07-08 MED ORDER — CO Q-10 200 MG PO CAPS: 200.0000 mg | ORAL_CAPSULE | Freq: Every day | ORAL | Status: DC

## 2020-07-08 MED ORDER — SODIUM CHLORIDE 0.9 % IV SOLN: 250.0000 mL | INTRAVENOUS | Status: DC | PRN

## 2020-07-08 MED ORDER — ONDANSETRON HCL 4 MG/2ML IJ SOLN
4.0000 mg | Freq: Four times a day (QID) | INTRAMUSCULAR | Status: DC | PRN
  Administered 2020-07-09 (×2): 4 mg via INTRAVENOUS
  Filled 2020-07-08 (×2): qty 2

## 2020-07-08 MED ORDER — SUCRALFATE 1 G PO TABS
1.0000 g | ORAL_TABLET | Freq: Four times a day (QID) | ORAL | Status: DC
  Administered 2020-07-08 – 2020-07-12 (×15): 1 g via ORAL
  Filled 2020-07-08 (×15): qty 1

## 2020-07-08 MED ORDER — APIXABAN 5 MG PO TABS
5.0000 mg | ORAL_TABLET | Freq: Two times a day (BID) | ORAL | Status: DC
  Administered 2020-07-08: 5 mg via ORAL
  Filled 2020-07-08: qty 1

## 2020-07-08 MED ORDER — FINASTERIDE 5 MG PO TABS
5.0000 mg | ORAL_TABLET | Freq: Every day | ORAL | Status: DC
  Administered 2020-07-08 – 2020-07-12 (×5): 5 mg via ORAL
  Filled 2020-07-08 (×5): qty 1

## 2020-07-08 MED ORDER — SODIUM CHLORIDE 0.9% FLUSH: 3.0000 mL | INTRAVENOUS | Status: DC | PRN

## 2020-07-08 MED ORDER — DIAZEPAM 5 MG PO TABS: 5.0000 mg | ORAL_TABLET | Freq: Three times a day (TID) | ORAL | Status: DC | PRN

## 2020-07-08 MED ORDER — PANTOPRAZOLE SODIUM 40 MG PO TBEC
40.0000 mg | DELAYED_RELEASE_TABLET | Freq: Every day | ORAL | Status: DC
  Administered 2020-07-08 – 2020-07-12 (×5): 40 mg via ORAL
  Filled 2020-07-08 (×5): qty 1

## 2020-07-08 MED ORDER — CYANOCOBALAMIN 1000 MCG/ML IJ SOLN
1000.0000 ug | INTRAMUSCULAR | Status: DC
  Administered 2020-07-08: 1000 ug via INTRAMUSCULAR
  Filled 2020-07-08: qty 1

## 2020-07-08 NOTE — Progress Notes (Signed)
[place] for heparin Indication: atrial fibrillation  Allergies  Allergen Reactions  . Novocain [Procaine] Other (See Comments)    Syncope  . Procaine Hcl Other (See Comments)    Passes out  . Lyrica [Pregabalin]   . Neurontin [Gabapentin]   . Amitriptyline Other (See Comments)    Sleepy  . Dicyclomine Other (See Comments)    Other reaction(s): Abdominal Pain  . Metformin Nausea Only  . Nitroglycerin Other (See Comments)    Makes Blood pressure go to low.     Patient Measurements: Height: 6\' 1"  (185.4 cm) Weight: 114.8 kg (253 lb) IBW/kg (Calculated) : 79.9 Heparin Dosing Weight: 104 kg  Vital Signs: Temp: 97.6 F (36.4 C) (11/02 0804) Temp Source: Oral (11/02 0804) BP: 128/78 (11/02 1718) Pulse Rate: 83 (11/02 1718)  Labs: Recent Labs    07/08/20 0810 07/08/20 1424 07/08/20 1600  HGB 12.1*  --   --   HCT 36.5*  --   --   PLT 78*  --   --   CREATININE 0.87  --   --   TROPONINIHS 8 21* 23*    Estimated Creatinine Clearance: 92.9 mL/min (by C-G formula based on SCr of 0.87 mg/dL).   Medical History: Past Medical History:  Diagnosis Date  . Arthritis    degenerative back   . BPH with obstruction/lower urinary tract symptoms   . CAD (coronary artery disease) 2010   stent  . Diabetes mellitus   . ED (erectile dysfunction)   . Elevated PSA   . GERD (gastroesophageal reflux disease)   . Gross hematuria   . H/O CT scan    recent renal CT due to hematuria, cystoscopy - normal    . Hematuria   . History of hiatal hernia   . History of kidney stones   . HLD (hyperlipidemia)   . Hypertension   . Ischemic heart disease 1991   with angioplasty  . Kidney stone   . Morbid obesity ([place])   . Myocardial infarction ([place])   . Peripheral neuropathy   . Pernicious anemia   . Pneumonia 1995   /w PE,   . Pulmonary embolism ([place])    previous  . Renal cyst   . Sleep apnea    pt. denies sleep apnea, pt. states he has had 2  studies - last one 2011  . Spinal stenosis      Assessment: 79 year old male presented with SOB and chest pain. Patient is on Eliquis PTA for afib. Cardiology following patient. Eliquis on hold for now pending need for invasive procedures. Pharmacy to start heparin drip.  Goal of Therapy:  Heparin level 0.3-0.7 units/ml aPTT 66-102 seconds Monitor platelets by anticoagulation protocol: Yes   Plan:  Per Dr. Saunders Revel, start heparin drip at time of next Eliquis dose with no bolus. Will start heparin drip at 1400 units/hr tonight at 2200 (last dose of Eliquis 11/2 ~ 1200). Will follow aPTT for now as HL expected to be elevated with recent Eliquis. HL, aPTT and CBC ordered at 0700 tomorrow morning.  Tawnya Crook, PharmD 07/08/2020,5:49 PM

## 2020-07-08 NOTE — ED Notes (Signed)
Attempted report at this time, RN still in report.

## 2020-07-08 NOTE — Consult Note (Signed)
ANTICOAGULATION CONSULT NOTE - Initial Consult  Pharmacy Consult for Eliquis Indication: History A fib/PE on indefinite Anticoagulation  Allergies  Allergen Reactions   Novocain [Procaine] Other (See Comments)    Syncope   Procaine Hcl Other (See Comments)    Passes out   Lyrica [Pregabalin]    Neurontin [Gabapentin]    Amitriptyline Other (See Comments)    Sleepy   Dicyclomine Other (See Comments)    Other reaction(s): Abdominal Pain   Metformin Nausea Only   Nitroglycerin Other (See Comments)    Makes Blood pressure go to low.     Patient Measurements: Height: 6\' 1"  (185.4 cm) Weight: 114.8 kg (253 lb) IBW/kg (Calculated) : 79.9  Vital Signs: Temp: 97.6 F (36.4 C) (11/02 0804) Temp Source: Oral (11/02 0804) BP: 179/71 (11/02 0930) Pulse Rate: 89 (11/02 0804)  Labs: Recent Labs    07/08/20 0810  HGB 12.1*  HCT 36.5*  PLT 78*  CREATININE 0.87  TROPONINIHS 8    Estimated Creatinine Clearance: 92.9 mL/min (by C-G formula based on SCr of 0.87 mg/dL).   Medical History: Past Medical History:  Diagnosis Date   Arthritis    degenerative back    BPH with obstruction/lower urinary tract symptoms    CAD (coronary artery disease) 2010   stent   Diabetes mellitus    ED (erectile dysfunction)    Elevated PSA    GERD (gastroesophageal reflux disease)    Gross hematuria    H/O CT scan    recent renal CT due to hematuria, cystoscopy - normal     Hematuria    History of hiatal hernia    History of kidney stones    HLD (hyperlipidemia)    Hypertension    Ischemic heart disease 1991   with angioplasty   Kidney stone    Morbid obesity ([place])    Myocardial infarction ([place])    Peripheral neuropathy    Pernicious anemia    Pneumonia 1995   /w PE,    Pulmonary embolism ([place])    previous   Renal cyst    Sleep apnea    pt. denies sleep apnea, pt. states he has had 2 studies - last one 2011   Spinal stenosis     Medications:   Home med Eliquis 5mg  bid  Assessment: Cody Price is a 79 y.o. male presents to the ER for shortness of breath leg swelling associated with some chest pain.  Pt with history of high risk hematuria and will need to be monitored carefully as such.  Goal of Therapy:  Monitor platelets by anticoagulation protocol: Yes   Plan:  Will restart home Eliquis 5mg  bid  Lu Duffel, PharmD, BCPS Clinical Pharmacist 07/08/2020 9:40 AM

## 2020-07-08 NOTE — ED Notes (Signed)
Cardiologist at bedside.  

## 2020-07-08 NOTE — ED Notes (Signed)
Care assumed of pt at this time. Pt with admission provider at this time.

## 2020-07-08 NOTE — ED Notes (Signed)
ED Provider at bedside. 

## 2020-07-08 NOTE — ED Notes (Signed)
Lab contacted to come draw labs at this time.

## 2020-07-08 NOTE — ED Notes (Signed)
Attempted report again, RN left phone at nurses station.

## 2020-07-08 NOTE — Progress Notes (Signed)
*  PRELIMINARY RESULTS* Echocardiogram 2D Echocardiogram has been performed.  Cody Price 07/08/2020, 1:33 PM

## 2020-07-08 NOTE — H&P (Signed)
History and Physical    Cody Price NOM:[id number] DOB: 1941-04-22 DOA: 07/08/2020  PCP: Cody Hire, MD   Patient coming from: Home  I have personally briefly reviewed patient's old medical records in [place]  Chief Complaint: Shortness of breath                                Chest pain  HPI: Cody Price is a 79 y.o. male with medical history significant for diabetes mellitus, GERD, obesity, atrial fibrillation and coronary artery disease status post stent angioplasty who presents to the emergency room for evaluation of shortness of breath which he has had for about 2 weeks.  Shortness of breath was initially associated with exertion but on the day of admission he was short of breath at rest.  He has some mild lower extremity swelling as well as orthopnea.  He also complains of midsternal chest pain which he rates a 5 x 10 in intensity at its worst.  Pain is nonradiating and he denies having any associated nausea, vomiting, diaphoresis or palpitations. He was recently started on Lasix for worsening swelling without any improvement in his symptoms. He denies having any fever, no chills, no cough, no headache, no dizziness, no lightheadedness, no abdominal pain or any changes in his bowel habits. Upon arrival to the ER he had a blood pressure 150/82, he was tachypneic with respiratory rate of 24 Labs show sodium 139, potassium 3.5, chloride 104, bicarb 25, glucose 192, BUN 13, creatinine 0.7, calcium 8.6, alkaline phosphatase 51, albumin 4.0, AST 15, ALT 16, total protein 6.7 BNP 336, troponin VIII, white count 6.3, hemoglobin 12.1, hematocrit 36.5, MCV 90.6, RDW 15.1, platelet count 78 Respiratory viral panel still pending Chest x-ray reviewed by me shows cardiomegaly with diffuse bilateral pulmonary interstitial pulmonary consistent with CHF. Twelve-lead EKG reviewed by me shows A. fib   ED Course: Patient is a 79 year old [name] who presents to the ER for  evaluation of worsening shortness of breath associated with orthopnea.  Chest x-ray shows CHF and BNP is elevated.  He will be admitted to the hospital for further evaluation.  Review of Systems: As per HPI otherwise 10 point review of systems negative.    Past Medical History:  Diagnosis Date  . Arthritis    degenerative back   . BPH with obstruction/lower urinary tract symptoms   . CAD (coronary artery disease) 2010   stent  . Diabetes mellitus   . ED (erectile dysfunction)   . Elevated PSA   . GERD (gastroesophageal reflux disease)   . Gross hematuria   . H/O CT scan    recent renal CT due to hematuria, cystoscopy - normal    . Hematuria   . History of hiatal hernia   . History of kidney stones   . HLD (hyperlipidemia)   . Hypertension   . Ischemic heart disease 1991   with angioplasty  . Kidney stone   . Morbid obesity ([place])   . Myocardial infarction ([place])   . Peripheral neuropathy   . Pernicious anemia   . Pneumonia 1995   /w PE,   . Pulmonary embolism ([place])    previous  . Renal cyst   . Sleep apnea    pt. denies sleep apnea, pt. states he has had 2 studies - last one 2011  . Spinal stenosis     Past Surgical History:  Procedure Laterality  Date  . APPENDECTOMY    . BACK SURGERY     x3 back surgeries - /w fusioin, 1- [place]  . bilateral inguinal hernia repair    . cad     stent 2010  . CARDIAC CATHETERIZATION  08/17/2014  . CARPAL TUNNEL RELEASE  2014   bilateral   . CHOLECYSTECTOMY    . COLONOSCOPY WITH PROPOFOL N/A 07/11/2019   Procedure: COLONOSCOPY WITH PROPOFOL;  Surgeon: Toledo, Benay Pike, MD;  Location: [facility];  Service: Gastroenterology;  Laterality: N/A;  . CORONARY STENT INTERVENTION N/A 06/07/2018   Procedure: CORONARY STENT INTERVENTION;  Surgeon: Nelva Bush, MD;  Location: [facility];  Service: Cardiovascular;  Laterality: N/A;  . ESOPHAGOGASTRODUODENOSCOPY (EGD) WITH PROPOFOL N/A 07/11/2019   Procedure:  ESOPHAGOGASTRODUODENOSCOPY (EGD) WITH PROPOFOL;  Surgeon: Toledo, Benay Pike, MD;  Location: [facility];  Service: Gastroenterology;  Laterality: N/A;  . LAMINECTOMY    . LEFT HEART CATH AND CORONARY ANGIOGRAPHY N/A 06/07/2018   Procedure: LEFT HEART CATH AND CORONARY ANGIOGRAPHY;  Surgeon: Nelva Bush, MD;  Location: [facility];  Service: Cardiovascular;  Laterality: N/A;     reports that he quit smoking about 34 years ago. He has a 45.00 pack-year smoking history. He has quit using smokeless tobacco. He reports current alcohol use. He reports that he does not use drugs.  Allergies  Allergen Reactions  . Novocain [Procaine] Other (See Comments)    Syncope  . Procaine Hcl Other (See Comments)    Passes out  . Lyrica [Pregabalin]   . Neurontin [Gabapentin]   . Amitriptyline Other (See Comments)    Sleepy  . Dicyclomine Other (See Comments)    Other reaction(s): Abdominal Pain  . Metformin Nausea Only  . Nitroglycerin Other (See Comments)    Makes Blood pressure go to low.     Family History  Problem Relation Age of Onset  . Heart failure Mother   . Heart disease Father   . Heart attack Brother   . Skin cancer Sister   . Kidney disease Neg Hx   . Prostate cancer Neg Hx   . Kidney cancer Neg Hx   . Bladder Cancer Neg Hx      Prior to Admission medications   Medication Sig Start Date End Date Taking? Authorizing Provider  atorvastatin (LIPITOR) 40 MG tablet TAKE 1 TABLET (40 MG TOTAL) BY MOUTH DAILY AT 6 PM. 11/14/19  Yes Gollan, Kathlene November, MD  Coenzyme Q10 (CO Q-10) 200 MG CAPS Take 200 mg by mouth daily.   Yes [provider]  cyanocobalamin (,VITAMIN B-12,) 1000 MCG/ML injection Inject 1,000 mcg into the muscle every 30 (thirty) days. 09/12/19  Yes [provider]  diltiazem (CARDIZEM CD) 180 MG 24 hr capsule Take 180 mg by mouth daily before breakfast.    Yes [provider]  ELIQUIS 5 MG TABS tablet TAKE 1 TABLET BY MOUTH TWICE A  DAY 02/28/20  Yes Gollan, Kathlene November, MD  finasteride (PROSCAR) 5 MG tablet Take 1 tablet (5 mg total) by mouth daily. 02/25/20  Yes McGowan, Larene Beach A, PA-C  furosemide (LASIX) 40 MG tablet Take 40 mg by mouth daily.   Yes Harrel Lemon, MD  glimepiride (AMARYL) 4 MG tablet Take 4 mg by mouth 2 (two) times daily.   Yes [provider]  lisinopril (PRINIVIL,ZESTRIL) 10 MG tablet TAKE ONE TABLET BY MOUTH ONCE DAILY Patient taking differently: Take 10 mg by mouth daily.  07/18/15  Yes  Minna Merritts, MD  Multiple Vitamin (MULTIVITAMIN WITH MINERALS) TABS tablet Take 1 tablet by mouth daily. 06/08/18  Yes Wieting, Richard, MD  omega-3 acid ethyl esters (LOVAZA) 1 g capsule Take 2 g by mouth daily.   Yes [provider]  omeprazole (PRILOSEC) 40 MG capsule Take 40 mg by mouth 2 (two) times daily. 06/07/19  Yes [provider]  potassium chloride SA (KLOR-CON) 20 MEQ tablet Take 1 tablet (20 mEq total) by mouth daily. 07/02/20  Yes Loel Dubonnet, NP  sucralfate (CARAFATE) 1 g tablet Take 1 g by mouth 4 (four) times daily. 06/07/19  Yes [provider]  tamsulosin (FLOMAX) 0.4 MG CAPS capsule Take 1 capsule (0.4 mg total) by mouth daily. 02/25/20  Yes McGowan, Larene Beach A, PA-C  testosterone cypionate (DEPOTESTOSTERONE CYPIONATE) 200 MG/ML injection Inject 200 mg into the muscle every 28 (twenty-eight) days.    Yes [provider]  acetaminophen (TYLENOL) 500 MG tablet Take 500 mg by mouth as needed.     [provider]  diazepam (VALIUM) 5 MG tablet Take 5 mg by mouth 3 (three) times daily as needed for anxiety or muscle spasms.     [provider]  feeding supplement, ENSURE ENLIVE, (ENSURE ENLIVE) LIQD Take 237 mLs by mouth daily. 06/08/18   Loletha Grayer, MD  nystatin cream (MYCOSTATIN) Apply 1 application topically 2 (two) times daily. Patient not taking: Reported on 07/08/2020 09/25/18   Zara Council A, PA-C  oxyCODONE-acetaminophen  (PERCOCET) 10-325 MG tablet Take 1 tablet by mouth every 4 (four) hours as needed for pain.     [provider]  sildenafil (VIAGRA) 100 MG tablet Take 100 mg by mouth daily as needed for erectile dysfunction.     [provider]    Physical Exam: Vitals:   07/08/20 0900 07/08/20 0930 07/08/20 1000 07/08/20 1130  BP: 140/85 (!) 179/71 (!) 153/95 105/69  Pulse:    88  Resp: (!) 25 (!) 22 (!) 31 20  Temp:      TempSrc:      SpO2:    97%  Weight:      Height:         Vitals:   07/08/20 0900 07/08/20 0930 07/08/20 1000 07/08/20 1130  BP: 140/85 (!) 179/71 (!) 153/95 105/69  Pulse:    88  Resp: (!) 25 (!) 22 (!) 31 20  Temp:      TempSrc:      SpO2:    97%  Weight:      Height:        Constitutional: NAD, alert and oriented x 3.  Patient appears comfortable and in no distress Eyes: PERRL, lids and conjunctivae normal ENMT: Mucous membranes are moist.  Neck: normal, supple, no masses, no thyromegaly Respiratory: Crackles at the bases bilaterally, no wheezing, Normal respiratory effort. No accessory muscle use.  Cardiovascular: Irregularly irregular, no murmurs / rubs / gallops. 1+ extremity edema. 2+ pedal pulses. No carotid bruits.  Abdomen: no tenderness, no masses palpated. No hepatosplenomegaly. Bowel sounds positive.  Central adiposity Musculoskeletal: no clubbing / cyanosis. No joint deformity upper and lower extremities.  Skin: no rashes, lesions, ulcers.  Neurologic: No gross focal neurologic deficit. Psychiatric: Normal mood and affect.   Labs on Admission: I have personally reviewed following labs and imaging studies  CBC: Recent Labs  Lab 07/08/20 0810  WBC 6.3  NEUTROABS 3.4  HGB 12.1*  HCT 36.5*  MCV 90.6  PLT 78*   Basic  Metabolic Panel: Recent Labs  Lab 07/08/20 0810  NA 139  K 3.5  CL 104  CO2 25  GLUCOSE 192*  BUN 13  CREATININE 0.87  CALCIUM 8.6*   GFR: Estimated Creatinine Clearance: 92.9 mL/min (by C-G formula based  on SCr of 0.87 mg/dL). Liver Function Tests: Recent Labs  Lab 07/08/20 0810  AST 15  ALT 16  ALKPHOS 51  BILITOT 1.9*  PROT 6.7  ALBUMIN 4.0   No results for input(s): LIPASE, AMYLASE in the last 168 hours. No results for input(s): AMMONIA in the last 168 hours. Coagulation Profile: No results for input(s): INR, PROTIME in the last 168 hours. Cardiac Enzymes: No results for input(s): CKTOTAL, CKMB, CKMBINDEX, TROPONINI in the last 168 hours. BNP (last 3 results) No results for input(s): PROBNP in the last 8760 hours. HbA1C: No results for input(s): HGBA1C in the last 72 hours. CBG: No results for input(s): GLUCAP in the last 168 hours. Lipid Profile: No results for input(s): CHOL, HDL, LDLCALC, TRIG, CHOLHDL, LDLDIRECT in the last 72 hours. Thyroid Function Tests: No results for input(s): TSH, T4TOTAL, FREET4, T3FREE, THYROIDAB in the last 72 hours. Anemia Panel: No results for input(s): VITAMINB12, FOLATE, FERRITIN, TIBC, IRON, RETICCTPCT in the last 72 hours. Urine analysis:    Component Value Date/Time   COLORURINE YELLOW 06/29/2019 [place] 06/29/2019 1457   APPEARANCEUR Clear 01/01/2016 1354   LABSPEC 1.025 06/29/2019 1457   LABSPEC 1.023 04/25/2014 0238   PHURINE 6.0 06/29/2019 1457   GLUCOSEU 100 (A) 06/29/2019 1457   GLUCOSEU 50 mg/dL 04/25/2014 0238   HGBUR NEGATIVE 06/29/2019 1457   BILIRUBINUR NEGATIVE 06/29/2019 1457   BILIRUBINUR Negative 01/01/2016 1354   BILIRUBINUR Negative 04/25/2014 0238   KETONESUR NEGATIVE 06/29/2019 1457   PROTEINUR NEGATIVE 06/29/2019 1457   NITRITE NEGATIVE 06/29/2019 [place] 06/29/2019 1457   LEUKOCYTESUR Negative 04/25/2014 0238    Radiological Exams on Admission: DG Chest Portable 1 View  Result Date: 07/08/2020 CLINICAL DATA:  Shortness of breath. EXAM: PORTABLE CHEST 1 VIEW COMPARISON:  06/05/2018. FINDINGS: Cardiomegaly with diffuse bilateral pulmonary interstitial prominence  consistent with CHF. Pneumonitis cannot be excluded. No prominent pleural effusion. No pneumothorax. IMPRESSION: Cardiomegaly with diffuse bilateral pulmonary interstitial prominence consistent with CHF. Electronically Signed   By: Marcello Moores  Register   On: 07/08/2020 08:42    EKG: Independently reviewed.  Atrial fibrillation  Assessment/Plan Principal Problem:   Acute on chronic diastolic CHF (congestive heart failure) (HCC) Active Problems:   ATRIAL FIBRILLATION   Diabetes mellitus (HCC)   Chronic idiopathic thrombocytopenia (HCC)   Hypertension   Chest pain     Acute on chronic diastolic dysfunction CHF Patient's last known LVEF was 55 - 60% in 2019 We will place patient on Lasix 40 mg IV every 12 Continue lisinopril We will repeat 2D echocardiogram to reassess LVEF   Chest pain In a patient with known coronary artery disease Patient not on aspirin due to chronic thrombocytopenia Continue apixaban and atorvastatin Obtain serial cardiac enzymes   History of atrial fibrillation Rate controlled on diltiazem Continue apixaban as primary prophylaxis for an acute stroke   Chronic idiopathic thrombocytopenia Stable Monitor closely for bleeding   Diabetes mellitus Hold oral hypoglycemic agents Maintain consistent carbohydrate diet Sliding scale insulin for glycemic control   BPH Continue finasteride and Flomax   GERD Continue oral PPI    DVT prophylaxis: Apixaban Code Status: DO NOT RESUSCITATE Family Communication: Greater than 50% of time was spent  discussing patient's condition and plan of care with him at the bedside.  All questions and concerns have been addressed.  He verbalizes understanding and agrees with the plan.  CODE STATUS was discussed and he wishes to be placed in a do resuscitate status Disposition Plan: Back to previous home environment Consults called: Cardiology    Kourtland Coopman MD [facility]     07/08/2020, 12:51 PM

## 2020-07-08 NOTE — ED Notes (Signed)
Report called to Maggie, RN.

## 2020-07-08 NOTE — ED Provider Notes (Signed)
[facility] Emergency Department Provider Note    None    (approximate)  I have reviewed the triage vital signs and the nursing notes.   HISTORY  Chief Complaint Shortness of Breath and Chest Pain    HPI Cody Price is a 79 y.o. male extensive past medical history as listed below presents to the ER for shortness of breath leg swelling associated with some chest pain.  Patient was recently just started on Lasix for worsening edema suggesting congestive heart failure.  He is on Eliquis and has been compliant with his medications.  States he has had significant urine output after starting the Lasix but still feeling worsening shortness of breath and orthopnea.  Does not wear home oxygen.  No recent fevers.  No pain with taking deep inspiration.    Past Medical History:  Diagnosis Date  . Arthritis    degenerative back   . BPH with obstruction/lower urinary tract symptoms   . CAD (coronary artery disease) 2010   stent  . Diabetes mellitus   . ED (erectile dysfunction)   . Elevated PSA   . GERD (gastroesophageal reflux disease)   . Gross hematuria   . H/O CT scan    recent renal CT due to hematuria, cystoscopy - normal    . Hematuria   . History of hiatal hernia   . History of kidney stones   . HLD (hyperlipidemia)   . Hypertension   . Ischemic heart disease 1991   with angioplasty  . Kidney stone   . Morbid obesity ([place])   . Myocardial infarction ([place])   . Peripheral neuropathy   . Pernicious anemia   . Pneumonia 1995   /w PE,   . Pulmonary embolism ([place])    previous  . Renal cyst   . Sleep apnea    pt. denies sleep apnea, pt. states he has had 2 studies - last one 2011  . Spinal stenosis    Family History  Problem Relation Age of Onset  . Heart failure Mother   . Heart disease Father   . Heart attack Brother   . Skin cancer Sister   . Kidney disease Neg Hx   . Prostate cancer Neg Hx   . Kidney cancer Neg Hx   . Bladder Cancer  Neg Hx    Past Surgical History:  Procedure Laterality Date  . APPENDECTOMY    . BACK SURGERY     x3 back surgeries - /w fusioin, 1- [place]  . bilateral inguinal hernia repair    . cad     stent 2010  . CARDIAC CATHETERIZATION  08/17/2014  . CARPAL TUNNEL RELEASE  2014   bilateral   . CHOLECYSTECTOMY    . COLONOSCOPY WITH PROPOFOL N/A 07/11/2019   Procedure: COLONOSCOPY WITH PROPOFOL;  Surgeon: Toledo, Benay Pike, MD;  Location: [facility] ENDOSCOPY;  Service: Gastroenterology;  Laterality: N/A;  . CORONARY STENT INTERVENTION N/A 06/07/2018   Procedure: CORONARY STENT INTERVENTION;  Surgeon: Nelva Bush, MD;  Location: [place] [facility];  Service: Cardiovascular;  Laterality: N/A;  . ESOPHAGOGASTRODUODENOSCOPY (EGD) WITH PROPOFOL N/A 07/11/2019   Procedure: ESOPHAGOGASTRODUODENOSCOPY (EGD) WITH PROPOFOL;  Surgeon: Toledo, Benay Pike, MD;  Location: [facility] ENDOSCOPY;  Service: Gastroenterology;  Laterality: N/A;  . LAMINECTOMY    . LEFT HEART CATH AND CORONARY ANGIOGRAPHY N/A 06/07/2018   Procedure: LEFT HEART CATH AND CORONARY ANGIOGRAPHY;  Surgeon: Nelva Bush, MD;  Location: [place] [facility];  Service:  Cardiovascular;  Laterality: N/A;   Patient Active Problem List   Diagnosis Date Noted  . Acute on chronic diastolic CHF (congestive heart failure) ([place]) 07/08/2020  . Atherosclerosis of native coronary artery of native heart with stable angina pectoris ([place]) 06/12/2018  . Chest pain 06/05/2018  . NSTEMI (non-ST elevated myocardial infarction) ([place])   . Weakness 02/13/2018  . Chronic postoperative pain 04/13/2017  . Lumbar post-laminectomy syndrome 04/13/2017  . Antiplatelet or antithrombotic long-term use 04/11/2017  . Neuropathy 09/21/2016  . Chronic, continuous use of opioids 05/27/2016  . Erectile dysfunction 01/01/2016  . BPH with obstruction/lower urinary tract symptoms 01/01/2016  . Pneumonia 11/03/2015  . Nocturia 10/02/2015  . Erectile dysfunction of  organic origin 10/02/2015  . Angina pectoris ([place]) 09/23/2015  . Thrombocytopenia ([place]) 09/23/2015  . B12 deficiency 05/26/2015  . Chronic idiopathic thrombocytopenia ([place]) 02/10/2015  . Anemia 05/08/2014  . Diabetes mellitus type 2, uncomplicated ([place]) 34/19/6222  . DDD (degenerative disc disease) 05/08/2014  . Elevated PSA 05/08/2014  . H/O ulcer disease 05/08/2014  . History of kidney stones 05/08/2014  . Hx of pulmonary embolus 05/08/2014  . Hypertension 05/08/2014  . Kidney stones 05/08/2014  . Obesity 05/08/2014  . Spinal stenosis 05/08/2014  . Chronic back pain 06/21/2013  . Diabetes mellitus ([place]) 06/20/2012  . Hyperlipidemia 05/28/2010  . Coronary atherosclerosis 05/28/2010  . ATRIAL FIBRILLATION 05/28/2010      Prior to Admission medications   Medication Sig Start Date End Date Taking? Authorizing Provider  acetaminophen (TYLENOL) 500 MG tablet Take 500 mg by mouth as needed.     [provider]  AMBULATORY NON FORMULARY MEDICATION Trimix (30/1/10)-(Pap/Phent/PGE)  Test Dose  60ml vial   [address] [place] [phone] Fax [phone] 12/24/19   Zara Council A, PA-C  atorvastatin (LIPITOR) 40 MG tablet TAKE 1 TABLET (40 MG TOTAL) BY MOUTH DAILY AT 6 PM. 11/14/19   Gollan, Kathlene November, MD  Coenzyme Q10 (CO Q-10) 200 MG CAPS Take 200 mg by mouth daily.    [provider]  cyanocobalamin (,VITAMIN B-12,) 1000 MCG/ML injection Inject 1,000 mcg into the muscle every 30 (thirty) days. 09/12/19   [provider]  diazepam (VALIUM) 5 MG tablet Take 5 mg by mouth 3 (three) times daily as needed for anxiety or muscle spasms.     [provider]  diltiazem (CARDIZEM CD) 180 MG 24 hr capsule Take 180 mg by mouth daily before breakfast.     [provider]  ELIQUIS 5 MG TABS tablet TAKE 1 TABLET BY MOUTH TWICE A DAY 02/28/20   Minna Merritts, MD  feeding supplement, ENSURE ENLIVE, (ENSURE ENLIVE) LIQD Take 237  mLs by mouth daily. 06/08/18   Loletha Grayer, MD  finasteride (PROSCAR) 5 MG tablet Take 1 tablet (5 mg total) by mouth daily. 02/25/20   Zara Council A, PA-C  furosemide (LASIX) 40 MG tablet Take 40 mg by mouth daily.    Harrel Lemon, MD  glimepiride (AMARYL) 4 MG tablet Take 4 mg by mouth 2 (two) times daily.    [provider]  lisinopril (PRINIVIL,ZESTRIL) 10 MG tablet TAKE ONE TABLET BY MOUTH ONCE DAILY Patient taking differently: Take 10 mg by mouth daily.  07/18/15   Minna Merritts, MD  Multiple Vitamin (MULTIVITAMIN WITH MINERALS) TABS tablet Take 1 tablet by mouth daily. 06/08/18   Loletha Grayer, MD  nystatin cream (MYCOSTATIN) Apply 1 application topically 2 (two) times daily. 09/25/18   Zara Council  A, PA-C  omega-3 acid ethyl esters (LOVAZA) 1 g capsule Take 2 g by mouth daily.    [provider]  omeprazole (PRILOSEC) 40 MG capsule Take 40 mg by mouth 2 (two) times daily. 06/07/19   [provider]  oxyCODONE-acetaminophen (PERCOCET) 10-325 MG tablet Take 1 tablet by mouth every 4 (four) hours as needed for pain.     [provider]  polyethylene glycol-electrolytes (NULYTELY/GOLYTELY) 420 g solution Take by mouth. 05/01/19   [provider]  potassium chloride SA (KLOR-CON) 20 MEQ tablet Take 1 tablet (20 mEq total) by mouth daily. 07/02/20   Loel Dubonnet, NP  sildenafil (VIAGRA) 100 MG tablet Take 100 mg by mouth daily as needed for erectile dysfunction.     [provider]  simvastatin (ZOCOR) 40 MG tablet Take 40 mg by mouth daily.    [provider]  sucralfate (CARAFATE) 1 g tablet Take 1 g by mouth 4 (four) times daily. 06/07/19   [provider]  tamsulosin (FLOMAX) 0.4 MG CAPS capsule Take 1 capsule (0.4 mg total) by mouth daily. 02/25/20   Zara Council A, PA-C  testosterone cypionate (DEPOTESTOSTERONE CYPIONATE) 200 MG/ML injection Inject 200 mg into the muscle every 28 (twenty-eight)  days.     [provider]    Allergies Novocain [procaine], Procaine hcl, Lyrica [pregabalin], Neurontin [gabapentin], Amitriptyline, Dicyclomine, Metformin, and Nitroglycerin    Social History Social History   Tobacco Use  . Smoking status: Former Smoker    Packs/day: 3.00    Years: 15.00    Pack years: 45.00    Quit date: 10/18/1985    Years since quitting: 34.7  . Smokeless tobacco: Former Systems developer  . Tobacco comment: tobacco use- no   Vaping Use  . Vaping Use: Never used  Substance Use Topics  . Alcohol use: Yes    Comment: rare  . Drug use: No    Review of Systems Patient denies headaches, rhinorrhea, blurry vision, numbness, shortness of breath, chest pain, edema, cough, abdominal pain, nausea, vomiting, diarrhea, dysuria, fevers, rashes or hallucinations unless otherwise stated above in HPI. ____________________________________________   PHYSICAL EXAM:  VITAL SIGNS: Vitals:   07/08/20 0804  BP: (!) 150/82  Pulse: 89  Resp: (!) 24  Temp: 97.6 F (36.4 C)  SpO2: 96%    Constitutional: Alert and oriented.  Eyes: Conjunctivae are normal.  Head: Atraumatic. Nose: No congestion/rhinnorhea. Mouth/Throat: Mucous membranes are moist.   Neck: No stridor. Painless ROM.  Cardiovascular: Normal rate, regular rhythm. Grossly normal heart sounds.  Good peripheral circulation. Respiratory: Normal respiratory effort.  No retractions. Lungs with bibasilar inspiratory crackles Gastrointestinal: Soft and nontender. No distention. No abdominal bruits. No CVA tenderness. Genitourinary:  Musculoskeletal: No lower extremity tenderness nor edema.  No joint effusions. Neurologic:  Normal speech and language. No gross focal neurologic deficits are appreciated. No facial droop Skin:  Skin is warm, dry and intact. No rash noted. Psychiatric: Speech and behavior are normal.  ____________________________________________   LABS (all labs ordered are listed, but only  abnormal results are displayed)  Results for orders placed or performed during the hospital encounter of 07/08/20 (from the past 24 hour(s))  CBC with Differential     Status: Abnormal   Collection Time: 07/08/20  8:10 AM  Result Value Ref Range   WBC 6.3 4.0 - 10.5 K/uL   RBC 4.03 (L) 4.22 - 5.81 MIL/uL   Hemoglobin 12.1 (L) 13.0 - 17.0 g/dL   HCT 36.5 (L) 39 -  52 %   MCV 90.6 80.0 - 100.0 fL   MCH 30.0 26.0 - 34.0 pg   MCHC 33.2 30.0 - 36.0 g/dL   RDW 15.1 11.5 - 15.5 %   Platelets 78 (L) 150 - 400 K/uL   nRBC 0.0 0.0 - 0.2 %   Neutrophils Relative % 53 %   Neutro Abs 3.4 1.7 - 7.7 K/uL   Lymphocytes Relative 10 %   Lymphs Abs 0.6 (L) 0.7 - 4.0 K/uL   Monocytes Relative 34 %   Monocytes Absolute 2.1 (H) 0.1 - 1.0 K/uL   Eosinophils Relative 1 %   Eosinophils Absolute 0.0 0.0 - 0.5 K/uL   Basophils Relative 0 %   Basophils Absolute 0.0 0.0 - 0.1 K/uL   Immature Granulocytes 2 %   Abs Immature Granulocytes 0.10 (H) 0.00 - 0.07 K/uL  Comprehensive metabolic panel     Status: Abnormal   Collection Time: 07/08/20  8:10 AM  Result Value Ref Range   Sodium 139 135 - 145 mmol/L   Potassium 3.5 3.5 - 5.1 mmol/L   Chloride 104 98 - 111 mmol/L   CO2 25 22 - 32 mmol/L   Glucose, Bld 192 (H) 70 - 99 mg/dL   BUN 13 8 - 23 mg/dL   Creatinine, Ser 0.87 0.61 - 1.24 mg/dL   Calcium 8.6 (L) 8.9 - 10.3 mg/dL   Total Protein 6.7 6.5 - 8.1 g/dL   Albumin 4.0 3.5 - 5.0 g/dL   AST 15 15 - 41 U/L   ALT 16 0 - 44 U/L   Alkaline Phosphatase 51 38 - 126 U/L   Total Bilirubin 1.9 (H) 0.3 - 1.2 mg/dL   GFR, Estimated >60 >60 mL/min   Anion gap 10 5 - 15  Troponin I (High Sensitivity)     Status: None   Collection Time: 07/08/20  8:10 AM  Result Value Ref Range   Troponin I (High Sensitivity) 8 <18 ng/L  Brain natriuretic peptide     Status: Abnormal   Collection Time: 07/08/20  8:10 AM  Result Value Ref Range   B Natriuretic Peptide 336.2 (H) 0.0 - 100.0 pg/mL    ____________________________________________  EKG My review and personal interpretation at Time: 8:09   Indication: sob  Rate: 80  Rhythm: afib Axis: normal Other: pvcs, nonspecific st abn ____________________________________________  RADIOLOGY  I personally reviewed all radiographic images ordered to evaluate for the above acute complaints and reviewed radiology reports and findings.  These findings were personally discussed with the patient.  Please see medical record for radiology report.  ____________________________________________   PROCEDURES  Procedure(s) performed:  Procedures    Critical Care performed: no ____________________________________________   INITIAL IMPRESSION / ASSESSMENT AND PLAN / ED COURSE  Pertinent labs & imaging results that were available during my care of the patient were reviewed by me and considered in my medical decision making (see chart for details).   DDX: Asthma, copd, CHF, pna, ptx, malignancy, Pe, anemia   NEAMIAH SCIARRA is a 79 y.o. who presents to the ED with symptoms as described above.  Patient with extensive cardiac history and clinically appears to be having worsening CHF despite outpatient management diuretics.  Chest x-ray does show pulmonary edema without elevation or effusion.  He is not hypoxic but describing worsening dyspnea and orthopnea.  Blood work otherwise is reassuring no sign of ACS mildly elevated BNP.  Will give IV Lasix will discuss with hospitalist for admission.  The patient was evaluated in Emergency Department today for the symptoms described in the history of present illness. He/she was evaluated in the context of the global COVID-19 pandemic, which necessitated consideration that the patient might be at risk for infection with the SARS-CoV-2 virus that causes COVID-19. Institutional protocols and algorithms that pertain to the evaluation of patients at risk for COVID-19 are in a state of rapid change based  on information released by regulatory bodies including the CDC and federal and state organizations. These policies and algorithms were followed during the patient's care in the ED.  As part of my medical decision making, I reviewed the following data within the [facility] notes reviewed and incorporated, Labs reviewed, notes from prior ED visits and [place] Controlled Substance Database   ____________________________________________   FINAL CLINICAL IMPRESSION(S) / ED DIAGNOSES  Final diagnoses:  Chest pain, unspecified type  Shortness of breath      NEW MEDICATIONS STARTED DURING THIS VISIT:  New Prescriptions   No medications on file     Note:  This document was prepared using Dragon voice recognition software and may include unintentional dictation errors.    Merlyn Lot, MD 07/08/20 [phone]

## 2020-07-08 NOTE — Consult Note (Addendum)
Cardiology Consultation:   Patient ID: VIC ESCO MRN: [id number]; DOB: 03-01-1941  Admit date: 07/08/2020 Date of Consult: 07/08/2020  Primary Care Provider: Baxter Hire, MD [facility] Cardiologist: Ida Rogue, MD  [place] Electrophysiologist:  None    Patient Profile:   Cody Price is a 79 y.o. male with a hx of CAD s/p PCI (1991 stent to RCA later occluded 100%, 05/2009 DES to LAD, 2015 LHC with restart of Plavix, 06/2018 LHC with PCI to mLAD and medical tx of RCA and small diagonal branch), asx PAF on Eliquis, HFpEF (EF 55-60%), moderate MR, pulmonary HTN, HTN, HLD with history of statin intolerance, DM2 with lower extremity neuropathy, spinal stenosis s/p surgery, GERD, and prior smoker who is being seen today for the evaluation of acute on chronic HFrEF at the request of Dr. Francine Graven.  History of Present Illness:   Cody Price is a 79 year old male with PMH as above and including CAD with LHC reports as copied and pasted under CV studies.  He has a history of statin intolerance.  He also has a history of atrial fibrillation with Eliquis stopped 06/14/2018 in the setting of hematuria and thrombocytopenia; however, with recurrent atrial fibrillation, restarted 03/12/2019.  Most recent echo was 06/2018 and showed EF 55 to 60% with PASP 40-48mmHg and further details as outlined below.  He was recently started on lasix 10/26 by his PCP for edema and SOB x1 week. He was seen the following day 10/27 in clinic by Laurann Montana, NP, and reported significant urine output with his Lasix.  He also noted improvement in his sx though still not back at baseline.  Plan was to obtain an echocardiogram with further recommendations at that time.  He was continued on Lasix 40 mg daily with addition of potassium 20 M EQ daily.  On 07/05/2020, he went to a pig picking.  He reported that he felt fine the day of the pig picking.  Shortly thereafter, he noticed progressive worsening of his  previously noted symptoms of SOB/DOE, LEE, wt gain, orthopnea, and PND.  He reported DOE with minimal activity, such as walking only a few feet.  He reported productive cough with white sputum.  He elevated the head of his bed, due to SOB, at a 45 degree angle.  He reported still waking up short of breath at night, at which time he would move to his recliner until his shortness of breath eased off and he would fall back asleep.  He had exertional, nonpleuritic, nonpositional, nonradiating anterior CP / tightness that felt similar to that felt before his interventions as outlined above; however, this time they were associated with his SOB/DOE. CP onset was also with minimal activity and would alleviate after 4 to 5 minutes of rest. He denied any racing heart rate or palpitations, as he does not feel his atrial fibrillation.  He reported compliance with [place] without s/sx of bleeding. He was taking his lasix.  He denied any presyncope or syncope.  No recent falls.  He did report fatigue. He had an episode of "30 / 10" CP last night that eventually dissipated.  This morning, he woke up short of breath and sat in his recliner until it dissipated. He turned off the heat in his house, which also helped. He then went to get breakfast and felt SOB on the drive there, worsening on the drive back to his home. He also felt CP, rated 8/10, and which would not dissipate.  He therefore  decided to present to [facility] ED and called EMS.  In the ED, initial vitals significant for BP 150/82, 96% on room air, ventricular rate 89 bpm: I better.  Chest x-ray showed cardiomegaly with diffuse bilateral interstitial prominence, consistent with CHF, though could not exclude pneumonitis. EKG showed atrial fibrillation with ventricular rate 78 bpm, IVCD, PVCs.  Labs showed hypokalemia with K3.5, stable renal function with Cr 0.87, BUN 13, stable LFTs, elevated bilirubin at 1.9, anemia with hemoglobin stable from previous labs at 12.1 and  hematocrit 36.5, HS Tn 8, BNP 336.2.  Cardiology was consulted.  He denies any known recent exposure to COVID-19.   Past Medical History:  Diagnosis Date  . Arthritis    degenerative back   . BPH with obstruction/lower urinary tract symptoms   . CAD (coronary artery disease) 2010   stent  . Diabetes mellitus   . ED (erectile dysfunction)   . Elevated PSA   . GERD (gastroesophageal reflux disease)   . Gross hematuria   . H/O CT scan    recent renal CT due to hematuria, cystoscopy - normal    . Hematuria   . History of hiatal hernia   . History of kidney stones   . HLD (hyperlipidemia)   . Hypertension   . Ischemic heart disease 1991   with angioplasty  . Kidney stone   . Morbid obesity ([place])   . Myocardial infarction ([place])   . Peripheral neuropathy   . Pernicious anemia   . Pneumonia 1995   /w PE,   . Pulmonary embolism ([place])    previous  . Renal cyst   . Sleep apnea    pt. denies sleep apnea, pt. states he has had 2 studies - last one 2011  . Spinal stenosis     Past Surgical History:  Procedure Laterality Date  . APPENDECTOMY    . BACK SURGERY     x3 back surgeries - /w fusioin, 1- [place]  . bilateral inguinal hernia repair    . cad     stent 2010  . CARDIAC CATHETERIZATION  08/17/2014  . CARPAL TUNNEL RELEASE  2014   bilateral   . CHOLECYSTECTOMY    . COLONOSCOPY WITH PROPOFOL N/A 07/11/2019   Procedure: COLONOSCOPY WITH PROPOFOL;  Surgeon: Toledo, Benay Pike, MD;  Location: [facility] ENDOSCOPY;  Service: Gastroenterology;  Laterality: N/A;  . CORONARY STENT INTERVENTION N/A 06/07/2018   Procedure: CORONARY STENT INTERVENTION;  Surgeon: Nelva Bush, MD;  Location: [place] CV LAB;  Service: Cardiovascular;  Laterality: N/A;  . ESOPHAGOGASTRODUODENOSCOPY (EGD) WITH PROPOFOL N/A 07/11/2019   Procedure: ESOPHAGOGASTRODUODENOSCOPY (EGD) WITH PROPOFOL;  Surgeon: Toledo, Benay Pike, MD;  Location: [facility] ENDOSCOPY;  Service: Gastroenterology;   Laterality: N/A;  . LAMINECTOMY    . LEFT HEART CATH AND CORONARY ANGIOGRAPHY N/A 06/07/2018   Procedure: LEFT HEART CATH AND CORONARY ANGIOGRAPHY;  Surgeon: Nelva Bush, MD;  Location: [place] CV LAB;  Service: Cardiovascular;  Laterality: N/A;     Home Medications:  Prior to Admission medications   Medication Sig Start Date End Date Taking? Authorizing Provider  atorvastatin (LIPITOR) 40 MG tablet TAKE 1 TABLET (40 MG TOTAL) BY MOUTH DAILY AT 6 PM. 11/14/19  Yes Gollan, Kathlene November, MD  Coenzyme Q10 (CO Q-10) 200 MG CAPS Take 200 mg by mouth daily.   Yes [provider]  cyanocobalamin (,VITAMIN B-12,) 1000 MCG/ML injection Inject 1,000 mcg into the muscle every 30 (thirty) days. 09/12/19  Yes [provider]  diltiazem (CARDIZEM CD) 180 MG 24 hr capsule Take 180 mg by mouth daily before breakfast.    Yes [provider]  ELIQUIS 5 MG TABS tablet TAKE 1 TABLET BY MOUTH TWICE A DAY 02/28/20  Yes Gollan, Kathlene November, MD  finasteride (PROSCAR) 5 MG tablet Take 1 tablet (5 mg total) by mouth daily. 02/25/20  Yes McGowan, Larene Beach A, PA-C  furosemide (LASIX) 40 MG tablet Take 40 mg by mouth daily.   Yes Harrel Lemon, MD  glimepiride (AMARYL) 4 MG tablet Take 4 mg by mouth 2 (two) times daily.   Yes [provider]  lisinopril (PRINIVIL,ZESTRIL) 10 MG tablet TAKE ONE TABLET BY MOUTH ONCE DAILY Patient taking differently: Take 10 mg by mouth daily.  07/18/15  Yes Minna Merritts, MD  Multiple Vitamin (MULTIVITAMIN WITH MINERALS) TABS tablet Take 1 tablet by mouth daily. 06/08/18  Yes Wieting, Richard, MD  omega-3 acid ethyl esters (LOVAZA) 1 g capsule Take 2 g by mouth daily.   Yes [provider]  omeprazole (PRILOSEC) 40 MG capsule Take 40 mg by mouth 2 (two) times daily. 06/07/19  Yes [provider]  potassium chloride SA (KLOR-CON) 20 MEQ tablet Take 1 tablet (20 mEq total) by mouth daily. 07/02/20  Yes Loel Dubonnet, NP  sucralfate  (CARAFATE) 1 g tablet Take 1 g by mouth 4 (four) times daily. 06/07/19  Yes [provider]  tamsulosin (FLOMAX) 0.4 MG CAPS capsule Take 1 capsule (0.4 mg total) by mouth daily. 02/25/20  Yes McGowan, Larene Beach A, PA-C  testosterone cypionate (DEPOTESTOSTERONE CYPIONATE) 200 MG/ML injection Inject 200 mg into the muscle every 28 (twenty-eight) days.    Yes [provider]  acetaminophen (TYLENOL) 500 MG tablet Take 500 mg by mouth as needed.     [provider]  diazepam (VALIUM) 5 MG tablet Take 5 mg by mouth 3 (three) times daily as needed for anxiety or muscle spasms.     [provider]  feeding supplement, ENSURE ENLIVE, (ENSURE ENLIVE) LIQD Take 237 mLs by mouth daily. 06/08/18   Loletha Grayer, MD  nystatin cream (MYCOSTATIN) Apply 1 application topically 2 (two) times daily. Patient not taking: Reported on 07/08/2020 09/25/18   Zara Council A, PA-C  oxyCODONE-acetaminophen (PERCOCET) 10-325 MG tablet Take 1 tablet by mouth every 4 (four) hours as needed for pain.     [provider]  sildenafil (VIAGRA) 100 MG tablet Take 100 mg by mouth daily as needed for erectile dysfunction.     [provider]    Inpatient Medications: Scheduled Meds: . apixaban  5 mg Oral BID  . atorvastatin  40 mg Oral q1800  . cyanocobalamin  1,000 mcg Intramuscular Q30 days  . [START ON 07/09/2020] diltiazem  180 mg Oral QAC breakfast  . finasteride  5 mg Oral Daily  . furosemide  40 mg Intravenous Q12H  . insulin aspart  0-15 Units Subcutaneous TID WC  . lisinopril  10 mg Oral Daily  . multivitamin with minerals  1 tablet Oral Daily  . omega-3 acid ethyl esters  2 g Oral Daily  . pantoprazole  40 mg Oral Daily  . potassium chloride SA  20 mEq Oral Daily  . sodium chloride flush  3 mL Intravenous Q12H  . sucralfate  1 g Oral QID  . tamsulosin  0.4 mg Oral Daily   Continuous Infusions: . sodium chloride     PRN Meds: sodium chloride, acetaminophen,  diazepam, ondansetron (ZOFRAN) IV,  oxyCODONE-acetaminophen **AND** oxyCODONE, sodium chloride flush  Allergies:    Allergies  Allergen Reactions  . Novocain [Procaine] Other (See Comments)    Syncope  . Procaine Hcl Other (See Comments)    Passes out  . Lyrica [Pregabalin]   . Neurontin [Gabapentin]   . Amitriptyline Other (See Comments)    Sleepy  . Dicyclomine Other (See Comments)    Other reaction(s): Abdominal Pain  . Metformin Nausea Only  . Nitroglycerin Other (See Comments)    Makes Blood pressure go to low.     Social History:   Social History   Socioeconomic History  . Marital status: Married    Spouse name: Not on file  . Number of children: Not on file  . Years of education: Not on file  . Highest education level: Not on file  Occupational History  . Not on file  Tobacco Use  . Smoking status: Former Smoker    Packs/day: 3.00    Years: 15.00    Pack years: 45.00    Quit date: 10/18/1985    Years since quitting: 34.7  . Smokeless tobacco: Former Systems developer  . Tobacco comment: tobacco use- no   Vaping Use  . Vaping Use: Never used  Substance and Sexual Activity  . Alcohol use: Yes    Comment: rare  . Drug use: No  . Sexual activity: Not on file  Other Topics Concern  . Not on file  Social History Narrative   Walks regularly. Retired.    Social Determinants of Health   Financial Resource Strain:   . Difficulty of Paying Living Expenses: Not on file  Food Insecurity:   . Worried About Charity fundraiser in the Last Year: Not on file  . Ran Out of Food in the Last Year: Not on file  Transportation Needs:   . Lack of Transportation (Medical): Not on file  . Lack of Transportation (Non-Medical): Not on file  Physical Activity:   . Days of Exercise per Week: Not on file  . Minutes of Exercise per Session: Not on file  Stress:   . Feeling of Stress : Not on file  Social Connections:   . Frequency of Communication with Friends and Family: Not on file  .  Frequency of Social Gatherings with Friends and Family: Not on file  . Attends Religious Services: Not on file  . Active Member of Clubs or Organizations: Not on file  . Attends Archivist Meetings: Not on file  . Marital Status: Not on file  Intimate Partner Violence:   . Fear of Current or Ex-Partner: Not on file  . Emotionally Abused: Not on file  . Physically Abused: Not on file  . Sexually Abused: Not on file    Family History:    Family History  Problem Relation Age of Onset  . Heart failure Mother   . Heart disease Father   . Heart attack Brother   . Skin cancer Sister   . Kidney disease Neg Hx   . Prostate cancer Neg Hx   . Kidney cancer Neg Hx   . Bladder Cancer Neg Hx      ROS:  Please see the history of present illness.  Review of Systems  Constitutional: Positive for malaise/fatigue. Negative for chills, diaphoresis and fever.  Respiratory: Positive for cough, sputum production and shortness of breath. Negative for hemoptysis and wheezing.   Cardiovascular: Positive for chest pain, orthopnea, leg swelling and PND. Negative for palpitations.  Gastrointestinal:  Negative for blood in stool and melena.  Genitourinary: Negative for hematuria.  Musculoskeletal: Positive for back pain. Negative for falls.  Neurological: Negative for dizziness, sensory change and loss of consciousness.  All other systems reviewed and are negative.   All other ROS reviewed and negative.     Physical Exam/Data:   Vitals:   07/08/20 0900 07/08/20 0930 07/08/20 1000 07/08/20 1130  BP: 140/85 (!) 179/71 (!) 153/95 105/69  Pulse:    88  Resp: (!) 25 (!) 22 (!) 31 20  Temp:      TempSrc:      SpO2:    97%  Weight:      Height:        Intake/Output Summary (Last 24 hours) at 07/08/2020 1314 Last data filed at 07/08/2020 1223 Gross per 24 hour  Intake --  Output 800 ml  Net -800 ml   Last 3 Weights 07/08/2020 07/07/2020 07/02/2020  Weight (lbs) 253 lb 252 lb 257 lb   Weight (kg) 114.76 kg 114.306 kg 116.574 kg     Body mass index is 33.38 kg/m.  General:  Well nourished, well developed, in no acute distress.  Lying in bed. HEENT: normal Lymph: no adenopathy Neck: JVD difficult to assess due to body habitus. Endocrine:  No thryomegaly Vascular: No carotid bruits; radial pulses 1+ bilaterally Cardiac:  normal S1, S2; IRIR; 1/6 systolic murmur Lungs: Bibasilar reduced breath sounds with right basilar crackles. No wheezing, rhonchi. Abd: Obese, nontender, no hepatomegaly  Ext: Mild bilateral edema Musculoskeletal:  No deformities, BUE and BLE strength normal and equal Skin: warm and dry  Neuro:  CNs 2-12 intact, no focal abnormalities noted Psych:  Normal affect   EKG:  The EKG was personally reviewed and demonstrates: . EKG showed atrial fibrillation with ventricular rate 78 bpm, IVCD/LAD, poor R wave progression, PVCs.  Telemetry:  Telemetry was personally reviewed and demonstrates: Not yet on telemetry  Relevant CV Studies:  LHC 06/07/2018 Conclusions: 1. Severe 2-vessel coronary artery disease, including 90% mid LAD stenosis involving small D1 branch just beyond old stent and chronic total occlusion of the distal RCA.  Severe apical LAD disease is stable from prior catheterization. 2. Moderate, non-obstructive disease involving the LCx. 3. Patent proximal LAD stent with mild in-stent restenosis. 4. Mildly elevated left ventricular filling pressure. 5. Successful PCI to mid LAD using Resolute Onyx 2.5 x 18 mm drug-eluting stent with 0% residual stenosis and TIMI-3 flow. Recommendations: 1. Medical therapy for chronic total occlusion of RCA and small diagonal branch. 2. Aggressive secondary prevention. 3. Restart heparin infusion 2 hours after deflation of TR band. Recommend to resume Apixaban, at currently prescribed dose and frequency, on 06/08/18 if no evidence of bleeding or vascular injury.  Recommend concurrent antiplatelet therapy of  Clopidogrel 75mg  daily for 12 months.  Nelva Bush, MD [facility]  Echo 06/06/2018 Left ventricle: The cavity size was normal. There was moderate  focal basal hypertrophy of the septum. Systolic function was  normal. The estimated ejection fraction was in the range of 55%  to 60%. There is hypokinesis of the basalinferior myocardium.  Doppler parameters are consistent with restrictive physiology,  indicative of decreased left ventricular diastolic compliance  and/or increased left atrial pressure. Doppler parameters are  consistent with high ventricular filling pressure.  - Mitral valve: Mildly thickened leaflets . There was moderate  regurgitation.  - Left atrium: The atrium was mildly dilated.  - Right ventricle: The cavity size was normal. Wall thickness was  normal. Systolic function was normal.  - Right atrium: The atrium was mildly dilated.  - Pulmonary arteries: Systolic pressure was moderately increased,  in the range of 40 mm Hg to 45 mm Hg plus central venous/right  atrial pressure.  - Pericardium, extracardiac: A trivial pericardial effusion was  identified posterior to the heart.   2015 LHC: Occluded proximal RCA (chronic), mild to moderate proximal left circumflex, moderate distal left circumflex (small vessel), and moderate proximal OM disease (moderate-sized vessel).  LAD proximal stent is patent.  Normal EF estimated at greater than 55%.  No significant MR or AS.  Medical management recommended.  Restart Plavix.   Laboratory Data:  High Sensitivity Troponin:   Recent Labs  Lab 07/08/20 0810  TROPONINIHS 8     Chemistry Recent Labs  Lab 07/08/20 0810  NA 139  K 3.5  CL 104  CO2 25  GLUCOSE 192*  BUN 13  CREATININE 0.87  CALCIUM 8.6*  GFRNONAA >60  ANIONGAP 10    Recent Labs  Lab 07/08/20 0810  PROT 6.7  ALBUMIN 4.0  AST 15  ALT 16  ALKPHOS 51  BILITOT 1.9*   Hematology Recent Labs  Lab 07/08/20 0810  WBC  6.3  RBC 4.03*  HGB 12.1*  HCT 36.5*  MCV 90.6  MCH 30.0  MCHC 33.2  RDW 15.1  PLT 78*   BNP Recent Labs  Lab 07/08/20 0810  BNP 336.2*    DDimer No results for input(s): DDIMER in the last 168 hours.   Radiology/Studies:  DG Chest Portable 1 View  Result Date: 07/08/2020 CLINICAL DATA:  Shortness of breath. EXAM: PORTABLE CHEST 1 VIEW COMPARISON:  06/05/2018. FINDINGS: Cardiomegaly with diffuse bilateral pulmonary interstitial prominence consistent with CHF. Pneumonitis cannot be excluded. No prominent pleural effusion. No pneumothorax. IMPRESSION: Cardiomegaly with diffuse bilateral pulmonary interstitial prominence consistent with CHF. Electronically Signed   By: Marcello Moores  Register   On: 07/08/2020 08:42     Assessment and Plan:   Exacerbation of Diastolic dysfunction Pulmonary HTN --Reports progressive symptoms of volume overload, including DOE with minimal activity.  Started on Lasix by his PCP.  Seen in clinic 10/27 with concern noted for new onset heart failure.   --Previous echo as above with normal EF and elevated PASP.  Suspect shortness of breath is 2/2 volume overload, worsened by recent pig picking.  --Repeat echo ordered by IM and pending results. --Volume up on exam. --Continue IV diuresis until euvolemic on exam or bump in renal function.  PTA medications include Lasix 40 mg daily; therefore, he will likely be able to tolerate up titration of this diuresis.  Closely monitor renal function with daily BMET.  Replete electrolytes with potassium supplementation and goal 4.0.  Monitor I's/O's, daily standing weights.  CHF education needed, including monitoring of salt and fluid guidelines.  Continue diltiazem 180 mg daily and lisinopril 10 mg daily. Further recommendations, if indicated, pending repeat echo.    Chest pain  H/o CAD s/p PCI --Reports chest pain at the time of cardiology evaluation.  As previously noted, his dyspnea is more consistent with volume overload.   Chest pain has both typical and atypical features.  He does have a history of CAD with multiple catheterizations and intervention as outlined above.  He also has several risk factors for cardiac etiology, including known history of CAD and previous history of smoking. High-sensitivity troponin 8.  EKG without acute ST/T changes.  Not consistent with ACS.   --Will obtain repeat echo. At  this time, no plan for invasive ischemic work-up unless echo shows reduced EF, WMA, or acute structural changes.  Further recommendations pending repeat echo. --Continue medical management with Eliquis 5 mg twice daily and place of ASA 81 mg daily, diltiazem 180 mg daily, lisinopril 10 mg daily.  Continue atorvastatin 40 mg daily for risk factor modification.  He reports that he does not tolerate sublingual nitro.  Hypokalemia --Replete with goal 4.0.  Check magnesium.  Permanent Atrial Fibrillation on anticoagulation --Remains in asymptomatic atrial fibrillation.   --Currently rate controlled on diltiazem 180 mg daily. --Reports compliance with Eliquis 5 mg BID.  CHA2DS2VASc score of at least 6  (DD, HTN, agex2, DM2, vascular) with recommendation for indefinite anticoagulation.  Continue anticoagulation. --No plan for cardioversion at this time, as he is permanent asymptomatic atrial fibrillation.  PVCs --As previously noted, we will obtain an echocardiogram to reassess his EF.  If EF is decreased, consider Zio monitoring to assess the burden of his PVCs.  He is asymptomatic.  Continue diltiazem.  Replete potassium as above.  Check magnesium.  We will recheck his TSH.  HTN - BP currently controlled. - Continue diltiazem 180mg  and titrate as HR and BP allow. - Continue lisinopril 10mg  daily - Continue to monitor  HLD -Continue atorvastatin 40 mg daily. - Goal LDL <70  Thrombocytopenia, anemia --H&H stable.   --Previously seen by [facility] hematology for thrombocytopenia. --Denies signs and symptoms of  bleeding. - Daily CBC.  Continue to monitor.  DM2 with complication, on long term insulin - SSI --A1c 6.6. - Documented neuropathy - Per IM, PCP       HEAR Score (for undifferentiated chest pain):  HEAR Score: 6   New York Heart Association (NYHA) Functional Class NYHA Class III III-IV   CHA2DS2-VASc Score = 6  This indicates a 9.7% annual risk of stroke. The patient's score is based upon: CHF History: 1 HTN History: 1 Diabetes History: 1 Stroke History: 0 Vascular Disease History: 1 Age Score: 2 Gender Score: 0         For questions or updates, please contact [facility] Please consult [email or web address] for contact info under    Signed, Arvil Chaco, PA-C  07/08/2020 1:14 PM

## 2020-07-08 NOTE — ED Triage Notes (Addendum)
Pt here from home via [facility].   Pt with c/o shortness of breath and right sided substernal chest pain, non-radiating that started at 5am this morning. EMS EKG shows afib w/ frequent PVCs. Symptoms worse with exertion.   Pt placed on 4L [place] with some improvements in [employer] Pt room air sats 965 at this time.   Pt has extensive cardiac hx with stent placements. Pt unable to take nitro/ aspirin.  Pt recently started on lasix for fluid retention.

## 2020-07-09 DIAGNOSIS — I5033 Acute on chronic diastolic (congestive) heart failure: Secondary | ICD-10-CM | POA: Diagnosis not present

## 2020-07-09 DIAGNOSIS — R079 Chest pain, unspecified: Secondary | ICD-10-CM

## 2020-07-09 DIAGNOSIS — I4821 Permanent atrial fibrillation: Secondary | ICD-10-CM | POA: Diagnosis not present

## 2020-07-09 DIAGNOSIS — I4891 Unspecified atrial fibrillation: Secondary | ICD-10-CM

## 2020-07-09 LAB — GLUCOSE, CAPILLARY
Glucose-Capillary: 173 mg/dL — ABNORMAL HIGH (ref 70–99)
Glucose-Capillary: 199 mg/dL — ABNORMAL HIGH (ref 70–99)
Glucose-Capillary: 147 mg/dL — ABNORMAL HIGH (ref 70–99)
Glucose-Capillary: 172 mg/dL — ABNORMAL HIGH (ref 70–99)

## 2020-07-09 LAB — CBC
HCT: 33.9 % — ABNORMAL LOW (ref 39.0–52.0)
Hemoglobin: 11.4 g/dL — ABNORMAL LOW (ref 13.0–17.0)
MCH: 30.3 pg (ref 26.0–34.0)
MCHC: 33.6 g/dL (ref 30.0–36.0)
MCV: 90.2 fL (ref 80.0–100.0)
Platelets: 79 10*3/uL — ABNORMAL LOW (ref 150–400)
RBC: 3.76 MIL/uL — ABNORMAL LOW (ref 4.22–5.81)
RDW: 15.1 % (ref 11.5–15.5)
WBC: 7.2 10*3/uL (ref 4.0–10.5)
nRBC: 0 % (ref 0.0–0.2)

## 2020-07-09 LAB — BASIC METABOLIC PANEL
Anion gap: 8 (ref 5–15)
BUN: 19 mg/dL (ref 8–23)
CO2: 28 mmol/L (ref 22–32)
Calcium: 8.5 mg/dL — ABNORMAL LOW (ref 8.9–10.3)
Chloride: 103 mmol/L (ref 98–111)
Creatinine, Ser: 1.09 mg/dL (ref 0.61–1.24)
GFR, Estimated: >60 mL/min (ref >60)
Glucose, Bld: 175 mg/dL — ABNORMAL HIGH (ref 70–99)
Potassium: 3.5 mmol/L (ref 3.5–5.1)
Sodium: 139 mmol/L (ref 135–145)

## 2020-07-09 LAB — HEPARIN LEVEL (UNFRACTIONATED): Heparin Unfractionated: 1.58 IU/mL — ABNORMAL HIGH (ref 0.30–0.70)

## 2020-07-09 LAB — APTT
aPTT: 64 seconds — ABNORMAL HIGH (ref 24–36)
aPTT: 88 seconds — ABNORMAL HIGH (ref 24–36)

## 2020-07-09 MED ORDER — METOPROLOL TARTRATE 25 MG/10 ML ORAL SUSPENSION
25.0000 mg | Freq: Two times a day (BID) | ORAL | Status: DC
  Filled 2020-07-09: qty 10

## 2020-07-09 MED ORDER — METOPROLOL SUCCINATE ER 50 MG PO TB24: 50.0000 mg | ORAL_TABLET | Freq: Every day | ORAL | Status: DC

## 2020-07-09 MED ORDER — METOPROLOL SUCCINATE ER 50 MG PO TB24
50.0000 mg | ORAL_TABLET | Freq: Every day | ORAL | Status: DC
  Administered 2020-07-10 – 2020-07-12 (×3): 50 mg via ORAL
  Filled 2020-07-09 (×3): qty 1

## 2020-07-09 MED ORDER — METOPROLOL TARTRATE 25 MG PO TABS: 25.0000 mg | ORAL_TABLET | Freq: Two times a day (BID) | ORAL | Status: DC

## 2020-07-09 NOTE — Progress Notes (Signed)
Progress Note  Patient Name: Cody Price Date of Encounter: 07/09/2020  [facility] Cardiologist: Ida Rogue, MD   Subjective   He reports constant chest pressure across his anterior chest, similar to that reported yesterday.  He states his breathing is better but still not at baseline.  His main complaint is of a sick feeling/nausea.  He reports having "episodes" or "rising sickness" from his epigastric area into his throat.  He states that he feels worse than yesterday because of these episodes.  He hopes that he is able to receive some nausea medication.  Plans for cardiac catheterization discussed with patient consent obtained verbally. Risks and benefits of cardiac catheterization have been discussed with the patient.  These include bleeding, infection, kidney damage, stroke, heart attack, death.  The patient understands these risks and is willing to proceed.  Inpatient Medications    Scheduled Meds: . aspirin EC  81 mg Oral Daily  . atorvastatin  40 mg Oral q1800  . cyanocobalamin  1,000 mcg Intramuscular Q30 days  . diltiazem  180 mg Oral QAC breakfast  . finasteride  5 mg Oral Daily  . furosemide  40 mg Intravenous Q12H  . insulin aspart  0-15 Units Subcutaneous TID WC  . lisinopril  10 mg Oral Daily  . multivitamin with minerals  1 tablet Oral Daily  . omega-3 acid ethyl esters  2 g Oral Daily  . pantoprazole  40 mg Oral Daily  . potassium chloride SA  20 mEq Oral Daily  . sodium chloride flush  3 mL Intravenous Q12H  . sucralfate  1 g Oral QID  . tamsulosin  0.4 mg Oral Daily   Continuous Infusions: . sodium chloride    . heparin 1,400 Units/hr (07/09/20 0650)   PRN Meds: sodium chloride, acetaminophen, diazepam, ondansetron (ZOFRAN) IV, oxyCODONE-acetaminophen **AND** oxyCODONE, sodium chloride flush   Vital Signs    Vitals:   07/08/20 2057 07/09/20 0413 07/09/20 0417 07/09/20 0742  BP: (!) 143/85 120/68  113/77  Pulse: (!) 47 (!) 52  63  Resp:  18 18  17   Temp: 98 F (36.7 C) 97.7 F (36.5 C)  98 F (36.7 C)  TempSrc: Oral Oral    SpO2: 96% 99%  98%  Weight:   109 kg   Height:        Intake/Output Summary (Last 24 hours) at 07/09/2020 0809 Last data filed at 07/09/2020 0650 Gross per 24 hour  Intake 358.63 ml  Output 1650 ml  Net -1291.37 ml   Last 3 Weights 07/09/2020 07/08/2020 07/07/2020  Weight (lbs) 240 lb 4.8 oz 253 lb 252 lb  Weight (kg) 108.999 kg 114.76 kg 114.306 kg      Telemetry    Atrial fibrillation - Personally Reviewed  ECG    No new tracings - Personally Reviewed  Physical Exam   GEN: No acute distress.  Lying in bed. Neck: No JVD Cardiac: IRIR, 1/6 systolic murmur, rubs, or gallops.  Respiratory:  Reduced breath sounds bilaterally. GI: Soft, nontender, non-distended  MS: No edema; No deformity. Neuro:  Nonfocal  Psych: Normal affect   Labs    High Sensitivity Troponin:   Recent Labs  Lab 07/08/20 0810 07/08/20 1424 07/08/20 1600  TROPONINIHS 8 21* 23*      Chemistry Recent Labs  Lab 07/08/20 0810 07/09/20 0604  NA 139 139  K 3.5 3.5  CL 104 103  CO2 25 28  GLUCOSE 192* 175*  BUN 13 19  CREATININE 0.87  1.09  CALCIUM 8.6* 8.5*  PROT 6.7  --   ALBUMIN 4.0  --   AST 15  --   ALT 16  --   ALKPHOS 51  --   BILITOT 1.9*  --   GFRNONAA >60 >60  ANIONGAP 10 8     Hematology Recent Labs  Lab 07/08/20 0810 07/09/20 0604  WBC 6.3 7.2  RBC 4.03* 3.76*  HGB 12.1* 11.4*  HCT 36.5* 33.9*  MCV 90.6 90.2  MCH 30.0 30.3  MCHC 33.2 33.6  RDW 15.1 15.1  PLT 78* 79*    BNP Recent Labs  Lab 07/08/20 0810  BNP 336.2*     DDimer No results for input(s): DDIMER in the last 168 hours.   Radiology    DG Chest Portable 1 View  Result Date: 07/08/2020 CLINICAL DATA:  Shortness of breath. EXAM: PORTABLE CHEST 1 VIEW COMPARISON:  06/05/2018. FINDINGS: Cardiomegaly with diffuse bilateral pulmonary interstitial prominence consistent with CHF. Pneumonitis cannot be excluded.  No prominent pleural effusion. No pneumothorax. IMPRESSION: Cardiomegaly with diffuse bilateral pulmonary interstitial prominence consistent with CHF. Electronically Signed   By: Marcello Moores  Register   On: 07/08/2020 08:42   ECHOCARDIOGRAM COMPLETE  Result Date: 07/08/2020    ECHOCARDIOGRAM REPORT   Patient Name:   Cody Price Date of Exam: 07/08/2020 Medical Rec #:  [id number]      Height:       73.0 in Accession #:    [id number]     Weight:       253.0 lb Date of Birth:  December 10, 1940       BSA:          2.378 m Patient Age:    79 years       BP:           105/69 mmHg Patient Gender: M              HR:           88 bpm. Exam Location:  [facility] Procedure: 2D Echo, Cardiac Doppler and Color Doppler Indications:     CHF- acute diastolic 202.54  History:         Patient has prior history of Echocardiogram examinations, most                  recent 06/06/2018. Previous Myocardial Infarction; Risk                  Factors:Diabetes. Pulmonary embolus.  Sonographer:     Sherrie Sport RDCS (AE) Referring Phys:  [id number] Collier Bullock Diagnosing Phys: Nelva Bush MD  Sonographer Comments: Image acquisition challenging due to patient body habitus. IMPRESSIONS  1. Left ventricular ejection fraction, by estimation, is 45 to 50%. The left ventricle has mildly decreased function. Left ventricular endocardial border not optimally defined to evaluate regional wall motion. There is mild left ventricular hypertrophy.  Left ventricular diastolic parameters are indeterminate.  2. Right ventricular systolic function is normal. The right ventricular size is mildly enlarged. Mildly increased right ventricular wall thickness. There is moderately elevated pulmonary artery systolic pressure.  3. Left atrial size was mildly dilated.  4. Right atrial size was mildly dilated.  5. The pericardial effusion is posterior to the left ventricle.  6. The mitral valve is degenerative. Moderate to severe mitral valve regurgitation. No evidence of mitral  stenosis.  7. The aortic valve is tricuspid. There is mild calcification of the aortic valve. There is mild thickening of the aortic valve.  Aortic valve regurgitation is not visualized. Mild to moderate aortic valve sclerosis/calcification is present, without any evidence of aortic stenosis.  8. Aortic dilatation noted. There is borderline dilatation of the aortic root, measuring 38 mm. FINDINGS  Left Ventricle: Left ventricular ejection fraction, by estimation, is 45 to 50%. The left ventricle has mildly decreased function. Left ventricular endocardial border not optimally defined to evaluate regional wall motion. The left ventricular internal cavity size was normal in size. There is mild left ventricular hypertrophy. Left ventricular diastolic parameters are indeterminate. Right Ventricle: The right ventricular size is mildly enlarged. Mildly increased right ventricular wall thickness. Right ventricular systolic function is normal. There is moderately elevated pulmonary artery systolic pressure. Left Atrium: Left atrial size was mildly dilated. Right Atrium: Right atrial size was mildly dilated. Pericardium: Trivial pericardial effusion is present. The pericardial effusion is posterior to the left ventricle. Mitral Valve: The mitral valve is degenerative in appearance. There is mild thickening of the mitral valve leaflet(s). Moderate to severe mitral valve regurgitation. No evidence of mitral valve stenosis. Tricuspid Valve: The tricuspid valve is not well visualized. Tricuspid valve regurgitation is trivial. Aortic Valve: The aortic valve is tricuspid. There is mild calcification of the aortic valve. There is mild thickening of the aortic valve. There is mild aortic valve annular calcification. Aortic valve regurgitation is not visualized. Mild to moderate aortic valve sclerosis/calcification is present, without any evidence of aortic stenosis. Aortic valve mean gradient measures 2.5 mmHg. Aortic valve peak  gradient measures 4.4 mmHg. Aortic valve area, by VTI measures 3.60 cm. Pulmonic Valve: The pulmonic valve was not well visualized. Pulmonic valve regurgitation is mild. No evidence of pulmonic stenosis. Aorta: Aortic dilatation noted. There is borderline dilatation of the aortic root, measuring 38 mm. Pulmonary Artery: The pulmonary artery is not well seen. Venous: The inferior vena cava was not well visualized. IAS/Shunts: The interatrial septum was not well visualized.  LEFT VENTRICLE PLAX 2D LVIDd:         5.16 cm LVIDs:         3.69 cm LV PW:         1.03 cm LV IVS:        0.95 cm LVOT diam:     2.00 cm LV SV:         58 LV SV Index:   25 LVOT Area:     3.14 cm  RIGHT VENTRICLE RV Basal diam:  4.31 cm RV S prime:     13.50 cm/s TAPSE (M-mode): 2.1 cm LEFT ATRIUM           Index       RIGHT ATRIUM           Index LA diam:      4.30 cm 1.81 cm/m  RA Area:     22.90 cm LA Vol (A4C): 65.9 ml 27.72 ml/m RA Volume:   73.60 ml  30.95 ml/m  AORTIC VALVE                   PULMONIC VALVE AV Area (Vmax):    2.43 cm    PV Vmax:        0.64 m/s AV Area (Vmean):   2.68 cm    PV Peak grad:   1.7 mmHg AV Area (VTI):     3.60 cm    RVOT Peak grad: 3 mmHg AV Vmax:           104.40 cm/s AV Vmean:  65.950 cm/s AV VTI:            0.162 m AV Peak Grad:      4.4 mmHg AV Mean Grad:      2.5 mmHg LVOT Vmax:         80.90 cm/s LVOT Vmean:        56.300 cm/s LVOT VTI:          0.186 m LVOT/AV VTI ratio: 1.14  AORTA Ao Root diam: 3.37 cm MITRAL VALVE                TRICUSPID VALVE MV Area (PHT): 4.21 cm     TR Peak grad:   44.4 mmHg MV Decel Time: 180 msec     TR Vmax:        333.00 cm/s MV E velocity: 102.00 cm/s                             SHUNTS                             Systemic VTI:  0.19 m                             Systemic Diam: 2.00 cm Nelva Bush MD Electronically signed by Nelva Bush MD Signature Date/Time: 07/08/2020/6:03:33 PM    Final     Cardiac Studies    LHC 06/07/2018 Conclusions: 1. Severe 2-vessel coronary artery disease, including 90% mid LAD stenosis involving small D1 branch just beyond old stent and chronic total occlusion of the distal RCA. Severe apical LAD disease is stable from prior catheterization. 2. Moderate, non-obstructive disease involving the LCx. 3. Patent proximal LAD stent with mild in-stent restenosis. 4. Mildly elevated left ventricular filling pressure. 5. Successful PCI to mid LAD using Resolute Onyx 2.5 x 18 mm drug-eluting stent with 0% residual stenosis and TIMI-3 flow. Recommendations: 1. Medical therapy for chronic total occlusion of RCA and small diagonal branch. 2. Aggressive secondary prevention. 3. Restart heparin infusion 2 hours after deflation of TR band. Recommend to resume Apixaban, at currently prescribed dose and frequency, on 06/08/18 if no evidence of bleeding or vascular injury. Recommend concurrent antiplatelet therapy of Clopidogrel 75mg  daily for 12 months. Nelva Bush, MD [facility]  Echo 06/06/2018 Left ventricle: The cavity size was normal. There was moderate  focal basal hypertrophy of the septum. Systolic function was  normal. The estimated ejection fraction was in the range of 55%  to 60%. There is hypokinesis of the basalinferior myocardium.  Doppler parameters are consistent with restrictive physiology,  indicative of decreased left ventricular diastolic compliance  and/or increased left atrial pressure. Doppler parameters are  consistent with high ventricular filling pressure.  - Mitral valve: Mildly thickened leaflets . There was moderate  regurgitation.  - Left atrium: The atrium was mildly dilated.  - Right ventricle: The cavity size was normal. Wall thickness was  normal. Systolic function was normal.  - Right atrium: The atrium was mildly dilated.  - Pulmonary arteries: Systolic pressure was moderately increased,  in the range of 40 mm Hg  to 45 mm Hg plus central venous/right  atrial pressure.  - Pericardium, extracardiac: A trivial pericardial effusion was  identified posterior to the heart.   2015 LHC: Occluded proximal RCA (chronic), mild to moderate proximal left circumflex, moderate distal left circumflex (  small vessel), and moderate proximal OM disease (moderate-sized vessel).  LAD proximal stent is patent.  Normal EF estimated at greater than 55%.  No significant MR or AS.  Medical management recommended.  Restart Plavix.   Patient Profile     79 y.o. male with a hx of CAD s/p PCI (1991 stent to RCA later occluded 100%, 9/2010DES toLAD, 2015 LHCwith restart of Plavix, 06/2018 LHC with PCI to mLAD and medical tx of RCA and small diagonal branch), asx PAF on Eliquis, HFpEF (EF 55-60%), moderate MR, pulmonary HTN, HTN, HLD with history of statin intolerance, DM2 with lower extremity neuropathy, spinal stenosis s/p surgery, GERD, and prior smoker who is being seen today for the evaluation of acute on chronic HFrEF and CP.  Assessment & Plan    AOC HFrEF (EF 45-50%) --Reports progressive symptoms of volume overload, including DOE with minimal activity.   --Echo shows reduced EF 45 to 50%.  Mild LVH. --Given his reduced EF, further ischemic work-up recommended at this time. Will plan for LHC as below. --Volume status improved today when compared with yesterday. --Output-1.6 L yesterday.  Net -1.3 L.  Weight 109 kg. --Continue IV diuresis until euvolemic on exam or bump in renal function.   --Daily BMET.  Maintain K 4.0.  Mg 2.0.     --Discontinued diltiazem, given reduced EF.   --Started Toprol 50mg  qd per MD. First dose AM of 11/4.   --Continue lisinopril 10 mg daily.  --Further recommendations, if indicated, pending MD to see patient.    Chest pain  H/o CAD s/p PCI --Current anterior CP/pressure.  This is constant and occurs at rest. --He has several risk factors for cardiac etiology, including known history  of CAD and previous history of smoking.  --High-sensitivity troponin 8.  EKG without acute ST/T changes.   --Echo shows reduced EF as above; therefore, with his constant chest pain, further ischemic work-up recommended at this time. --Eliquis held in preparation for LHC this admission/48-hour washout. --Continue IV heparin.   --Continue ASA 81 mg daily --As above, discontinued diltiazem 180 mg daily given reduced EF.   --Started Toprol 50 mg daily with first dose 11/4.   --Continue lisinopril 10 mg daily.   --Continue atorvastatin 40 mg daily for risk factor modification.   --Reports he is intolerant to sublingual nitro. --Plan for LHC later this week with Dr. Rockey Situ. --NPO after midnight.  --Risks and benefits of cardiac catheterization have been discussed with the patient.  These include bleeding, infection, kidney damage, stroke, heart attack, death.  The patient understands these risks and is willing to proceed.  Hypokalemia --K 3.5. --Replete with goal 4.0.   --Check magnesium.  Permanent Atrial Fibrillation on anticoagulation --Remains in asymptomatic atrial fibrillation.   --Discontinued diltiazem given reduced EF.   --Toprol 50 mg daily to start 11/4. --Continue IV heparin for anticoagulation.   --Eliquis on hold given possible LHC.  Continue to hold for 48h washout. --Reports compliance with Eliquis 5 mg BID.  CHA2DS2VASc score of at least6 (DD, HTN, agex2, DM2, vascular) with recommendation for indefinite anticoagulation.  Will need restart of Eliquis before discharge.  No plan for cardioversion at this time, as he is permanent asymptomatic atrial fibrillation.  PVCs --Asymptomatic.   --EF reduced on most recent echo.  --If further ischemic work-up unrevealing, consider outpatient ZIO monitoring to assess burden of PVCs. --Diltiazem discontinued given reduced EF.   --Toprol scheduled to start 11/4.   --Replete potassium as above.   --Check magnesium.  We will recheck  his TSH.  HTN - BP currently controlled. --Diltiazem discontinued, given reduced EF. - Continue Toprol 50 mg to start 11/4. - Continue lisinopril 10mg  daily - Continue to monitor  HLD -Continue atorvastatin 40 mg daily. - Goal LDL <70  Thrombocytopenia, anemia --H&H stable.   --Previously seen by [facility] hematology for thrombocytopenia. --Denies signs and symptoms of bleeding. - Daily CBC.  Continue to monitor.  IZ1YOFV complication, on long term insulin - SSI --A1c 6.6. - Documented neuropathy - Per IM, PCP   HEAR Score (for undifferentiated chest pain):  HEAR Score: 6   New York Heart Association (NYHA) Functional Class NYHA Class III III-IV   CHA2DS2-VASc Score = 6  This indicates a 9.7% annual risk of stroke. The patient's score is based upon: CHF History: 1 HTN History: 1 Diabetes History: 1 Stroke History: 0 Vascular Disease History: 1 Age Score: 2 Gender Score: 0  For questions or updates, please contact [facility] Please consult [email or web address] for contact info under        Signed, Arvil Chaco, PA-C  07/09/2020, 8:09 AM

## 2020-07-09 NOTE — Progress Notes (Signed)
Mobility Specialist - Progress Note   07/09/20 1300  Mobility  Range of Motion/Exercises Right leg;Left leg (heel slides, slr, quad sets, hip isometrics, hip add/abd)  Level of Assistance Minimal assist, patient does 75% or more  Assistive Device None  Distance Ambulated (ft) 0 ft  Mobility Response Tolerated well  Mobility performed by Mobility specialist  $Mobility charge 1 Mobility    Pre-mobility: 55 HR, 98% SpO2 Post-mobility: 65 HR, 95% SpO2   Pt was lying in bed upon arrival. Pt agreed to session. Pt c/o back pain "8/10" and declined OOB activity d/t this. Nurse entered room mid-session. Pt was able to perform supine exercises: ankle pumps, heel slides, straight leg raises, quad sets, and hip isometrics. Pt stated "that makes my back pain worse" at the end of session, now rating it a "10/10". Nurse was notified.    Kathee Delton Mobility Specialist 07/09/20, 3:36 PM

## 2020-07-09 NOTE — Progress Notes (Signed)
OT Cancellation Note  Patient Details Name: Cody Price MRN: [id number] DOB: Sep 16, 1940   Cancelled Treatment:    Reason Eval/Treat Not Completed: Medical issues which prohibited therapy. Thank you for the OT consult. Order received and chart reviewed. Noted plans for LHC next date.  Per conversation with care team, cardiology wishes to hold therapy evaluations until LHC complete.  Will continue to follow and initiate as medically appropriate.  Shara Blazing, M.S., OTR/L Ascom: [phone] 07/09/20, 10:32 AM

## 2020-07-09 NOTE — TOC Initial Note (Signed)
Transition of Care [facility]) - Initial/Assessment Note    Patient Details  Name: Cody Price MRN: [id number] Date of Birth: September 07, 1940  [facility] CM/SW Contact:    Eileen Stanford, LCSW Phone Number: 07/09/2020, 9:43 AM  Clinical Narrative:  Pt is alert and oriented. Pt states he drives himself to his appointments. Pt states he has a established PCP. [facility] [place] and has no difficulties getting his medications filled. Pt states he is not receiving any in home services at this time. Pt lives with his spouse.   Pt does not have a scale at home. CSW will provide one at bedside--pt accepting. Pt made aware to weigh himself daily and notify his MD if any weight gain.   No additional needs at this time.                 Expected Discharge Plan: Home/Self Care Barriers to Discharge: Continued Medical Work up   Patient Goals and CMS Choice Patient states their goals for this hospitalization and ongoing recovery are:: to get better      Expected Discharge Plan and Services Expected Discharge Plan: Home/Self Care In-house Referral: Clinical Social Work   Post Acute Care Choice: NA Living arrangements for the past 2 months: [place]                   DME Agency: NA       HH Arranged: NA          Prior Living Arrangements/Services Living arrangements for the past 2 months: Single Family Home Lives with:: Spouse Patient language and need for interpreter reviewed:: Yes Do you feel safe going back to the place where you live?: Yes      Need for Family Participation in Patient Care: Yes (Comment) Care giver support system in place?: Yes (comment)   Criminal Activity/Legal Involvement Pertinent to Current Situation/Hospitalization: No - Comment as needed  Activities of Daily Living Home Assistive Devices/Equipment: Eyeglasses, CBG Meter, Dentures (specify type) (Upper and lower) ADL Screening (condition at time of admission) Patient's cognitive  ability adequate to safely complete daily activities?: Yes Is the patient deaf or have difficulty hearing?: No Does the patient have difficulty seeing, even when wearing glasses/contacts?: No Does the patient have difficulty concentrating, remembering, or making decisions?: No Patient able to express need for assistance with ADLs?: No Does the patient have difficulty dressing or bathing?: No Independently performs ADLs?: Yes (appropriate for developmental age) Does the patient have difficulty walking or climbing stairs?: Yes Weakness of Legs: None Weakness of Arms/Hands: None  Permission Sought/Granted Permission sought to share information with : Family Supports    Share Information with NAME: Levander Campion     Permission granted to share info w Relationship: spouse     Emotional Assessment Appearance:: Appears stated age Attitude/Demeanor/Rapport: Engaged Affect (typically observed): Accepting, Appropriate Orientation: : Oriented to Self, Oriented to Place, Oriented to  Time, Oriented to Situation Alcohol / Substance Use: Not Applicable Psych Involvement: No (comment)  Admission diagnosis:  Shortness of breath [R06.02] Acute on chronic diastolic CHF (congestive heart failure) (HCC) [I50.33] Chest pain, unspecified type [R07.9] Patient Active Problem List   Diagnosis Date Noted  . Acute on chronic diastolic CHF (congestive heart failure) ([place]) 07/08/2020  . Atherosclerosis of native coronary artery of native heart with stable angina pectoris ([place]) 06/12/2018  . Chest pain 06/05/2018  . NSTEMI (non-ST elevated myocardial infarction) ([place])   . Weakness 02/13/2018  .  Chronic postoperative pain 04/13/2017  . Lumbar post-laminectomy syndrome 04/13/2017  . Antiplatelet or antithrombotic long-term use 04/11/2017  . Neuropathy 09/21/2016  . Chronic, continuous use of opioids 05/27/2016  . Erectile dysfunction 01/01/2016  . BPH with obstruction/lower urinary tract symptoms 01/01/2016  .  Pneumonia 11/03/2015  . Nocturia 10/02/2015  . Erectile dysfunction of organic origin 10/02/2015  . Angina pectoris ([place]) 09/23/2015  . Thrombocytopenia ([place]) 09/23/2015  . B12 deficiency 05/26/2015  . Chronic idiopathic thrombocytopenia ([place]) 02/10/2015  . Anemia 05/08/2014  . Diabetes mellitus type 2, uncomplicated ([place]) 22/29/7989  . DDD (degenerative disc disease) 05/08/2014  . Elevated PSA 05/08/2014  . H/O ulcer disease 05/08/2014  . History of kidney stones 05/08/2014  . Hx of pulmonary embolus 05/08/2014  . Hypertension 05/08/2014  . Kidney stones 05/08/2014  . Obesity 05/08/2014  . Spinal stenosis 05/08/2014  . Chronic back pain 06/21/2013  . Diabetes mellitus ([place]) 06/20/2012  . Hyperlipidemia 05/28/2010  . Coronary atherosclerosis 05/28/2010  . ATRIAL FIBRILLATION 05/28/2010   PCP:  Baxter Hire, MD Pharmacy:   [id number] [place], [place] [address] Phone: [phone] Fax: [phone]     Social Determinants of Health (SDOH) Interventions    Readmission Risk Interventions Readmission Risk Prevention Plan 07/09/2020  Transportation Screening Complete  PCP or Specialist Appt within 3-5 Days Complete  HRI or [place] Complete  Social Work Consult for [place] Planning/Counseling Complete  Palliative Care Screening Complete  Medication Review Press photographer) Complete  Some recent data might be hidden

## 2020-07-09 NOTE — Progress Notes (Addendum)
Cody Price for heparin Indication: chest pain/ACS and atrial fibrillation  Allergies  Allergen Reactions  . Novocain [Procaine] Other (See Comments)    Syncope  . Procaine Hcl Other (See Comments)    Passes out  . Lyrica [Pregabalin]   . Neurontin [Gabapentin]   . Amitriptyline Other (See Comments)    Sleepy  . Dicyclomine Other (See Comments)    Other reaction(s): Abdominal Pain  . Metformin Nausea Only  . Nitroglycerin Other (See Comments)    Makes Blood pressure go to low.     Patient Measurements: Height: 6\' 1"  (185.4 cm) Weight: 109 kg (240 lb 4.8 oz) IBW/kg (Calculated) : 79.9 Heparin Dosing Weight: 104 kg  Vital Signs: Temp: 98 F (36.7 C) (11/03 0742) Temp Source: Oral (11/03 0413) BP: 113/77 (11/03 0742) Pulse Rate: 63 (11/03 0742)  Labs: Recent Labs    07/08/20 0810 07/08/20 1424 07/08/20 1600 07/09/20 0604 07/09/20 0700  HGB 12.1*  --   --  11.4*  --   HCT 36.5*  --   --  33.9*  --   PLT 78*  --   --  79*  --   APTT  --   --   --   --  64*  CREATININE 0.87  --   --  1.09  --   TROPONINIHS 8 21* 23*  --   --     Estimated Creatinine Clearance: 72.3 mL/min (by C-G formula based on SCr of 1.09 mg/dL).   Medical History: Past Medical History:  Diagnosis Date  . Arthritis    degenerative back   . BPH with obstruction/lower urinary tract symptoms   . CAD (coronary artery disease) 2010   stent  . Diabetes mellitus   . ED (erectile dysfunction)   . Elevated PSA   . GERD (gastroesophageal reflux disease)   . Gross hematuria   . H/O CT scan    recent renal CT due to hematuria, cystoscopy - normal    . Hematuria   . History of hiatal hernia   . History of kidney stones   . HLD (hyperlipidemia)   . Hypertension   . Ischemic heart disease 1991   with angioplasty  . Kidney stone   . Morbid obesity ([place])   . Myocardial infarction ([place])   . Peripheral neuropathy   . Pernicious anemia   . Pneumonia 1995    /w PE,   . Pulmonary embolism ([place])    previous  . Renal cyst   . Sleep apnea    pt. denies sleep apnea, pt. states he has had 2 studies - last one 2011  . Spinal stenosis      Assessment: 79 year old male presented with SOB and chest pain. Patient is on Eliquis PTA for afib. Cardiology following patient. Eliquis on hold for now pending need for invasive procedures. Pharmacy to start heparin drip. No baseline labs were ordered. Pt has thrombocytopenia. Continue to monitor plt.   11/3 0700 aPTT 64 HL 1.58.   Goal of Therapy:  Heparin level 0.3-0.7 units/ml once aPTT and HL correlate.  aPTT 66-102 seconds Monitor platelets by anticoagulation protocol: Yes   Plan:  APTT is slightly subtherapeutic. Will increase heparin infusion to 1600 units/hr. No bolus at this time, as its only slightly subtherapeutic. Recheck aPTT in 8 hours. Will monitor with aPTT as heparin level is falsely elevated, due to apixaban, will switch to heparin level once aPTT and heparin level correlate. CBC and heparin  level with AM labs.   Oswald Hillock, PharmD, BCPS 07/09/2020,7:46 AM

## 2020-07-09 NOTE — Progress Notes (Signed)
PT Cancellation Note  Patient Details Name: Cody Price MRN: [id number] DOB: 13-Jan-1941   Cancelled Treatment:    Reason Eval/Treat Not Completed: Medical issues which prohibited therapy (Consult received and chart reviewed. Noted plans for LHC next date.  Per cardiology, to hold PT evaluation until LHC complete.  Will continue to follow and initiate as medically appropriate.)   Jayelle Page H. Owens Shark, PT, DPT, NCS 07/09/20, 10:28 AM [phone]

## 2020-07-09 NOTE — Progress Notes (Signed)
  Heart Failure Nurse Navigator Note  HFmrEF 45-50%, normal right ventricular systolic function, moderately elevated pulmonary artery pressure.  Moderate to severe MR.  He presented to the hospital with complaints of chest pressure, increasing SOB with exertion and then with rest. Orthopnea.  He had previously reported EF of 55-60% in 2019.  Co morbidities:  Permament Atrial fibrillation Diabetes type 2 Hypertension Hyperlipidemia Coronary artery disease ([place]) Obesity Pernicious anemia OSA  Medications:  ASA 81 mg Lipitor 40 mg daily Lasix 40 mg IV BID Heparin infusion(Eliquis on hold) Zestril 10 mg daily   Labs:  Sodium 139, potassium 3.5, BUN 19, creatinine 1.09, magnesium 1.6, TSH 1.197, BNP 336, troponins 8, 23,24.  REDs clip vest 39.    BP 113/77 pulse 63  Intake 358 ml Output 1650 ml   Assessment:  General- he is awake and alert, watching TV, states he has chest pressure that just comes and goes.  HEENT- normocephalic, pupils equal, no JVD  Cardiac- heart tones are irregular  Chest- clear with diminished breath sounds bases.  Abdomen- rounded soft, non tender  Musculoskeletal- no lower extremity edema  Psych- is pleasant and appropriate.  Neuro- speech clear, moves all extremities without difficulty.     Spoke with patient, he is aware that there is a slight decrease in the function of the bottom(ventricle) of his heart.  To have heart catheterization tomorrow.  He continues to experience chest pressure, with lying in bed.  States that his nurse is aware and also doctors.  Discussed importance of no salt added at table and also when cooking. Sticking with 2000 mg of sodium daily and fluid restriction of 64 oz.  Also discussed the importance of weighing daily and recording.  Signs and symptoms to report to physician or to [facility] NP in heart failure outpt clinic.  Given heart failure teaching booklet and zone magnet.   Pricilla Riffle RN, CHFN

## 2020-07-09 NOTE — Progress Notes (Signed)
[place] for heparin Indication: chest pain/ACS and atrial fibrillation  Allergies  Allergen Reactions  . Novocain [Procaine] Other (See Comments)    Syncope  . Procaine Hcl Other (See Comments)    Passes out  . Lyrica [Pregabalin]   . Neurontin [Gabapentin]   . Amitriptyline Other (See Comments)    Sleepy  . Dicyclomine Other (See Comments)    Other reaction(s): Abdominal Pain  . Metformin Nausea Only  . Nitroglycerin Other (See Comments)    Makes Blood pressure go to low.     Patient Measurements: Height: 6\' 1"  (185.4 cm) Weight: 109 kg (240 lb 4.8 oz) IBW/kg (Calculated) : 79.9 Heparin Dosing Weight: 104 kg  Vital Signs: Temp: 97.8 F (36.6 C) (11/03 1614) Temp Source: Oral (11/03 1614) BP: 131/77 (11/03 1614) Pulse Rate: 80 (11/03 1614)  Labs: Recent Labs    07/08/20 0810 07/08/20 1424 07/08/20 1600 07/09/20 0604 07/09/20 0700 07/09/20 1549  HGB 12.1*  --   --  11.4*  --   --   HCT 36.5*  --   --  33.9*  --   --   PLT 78*  --   --  79*  --   --   APTT  --   --   --   --  64* 88*  HEPARINUNFRC  --   --   --   --  1.58*  --   CREATININE 0.87  --   --  1.09  --   --   TROPONINIHS 8 21* 23*  --   --   --     Estimated Creatinine Clearance: 72.3 mL/min (by C-G formula based on SCr of 1.09 mg/dL).   Medical History: Past Medical History:  Diagnosis Date  . Arthritis    degenerative back   . BPH with obstruction/lower urinary tract symptoms   . CAD (coronary artery disease) 2010   stent  . Diabetes mellitus   . ED (erectile dysfunction)   . Elevated PSA   . GERD (gastroesophageal reflux disease)   . Gross hematuria   . H/O CT scan    recent renal CT due to hematuria, cystoscopy - normal    . Hematuria   . History of hiatal hernia   . History of kidney stones   . HLD (hyperlipidemia)   . Hypertension   . Ischemic heart disease 1991   with angioplasty  . Kidney stone   . Morbid obesity ([place])   .  Myocardial infarction ([place])   . Peripheral neuropathy   . Pernicious anemia   . Pneumonia 1995   /w PE,   . Pulmonary embolism ([place])    previous  . Renal cyst   . Sleep apnea    pt. denies sleep apnea, pt. states he has had 2 studies - last one 2011  . Spinal stenosis      Assessment: 79 year old male presented with SOB and chest pain. Patient is on Eliquis PTA for afib. Cardiology following patient. Eliquis on hold for now pending need for invasive procedures. Pharmacy to start heparin drip. No baseline labs were ordered. Pt has thrombocytopenia. Continue to monitor plt.   11/3 0700 aPTT 64 HL 1.58. 11/3 1549 aPTT 88 Therapeutic x 1   Goal of Therapy:  Heparin level 0.3-0.7 units/ml once aPTT and HL correlate.  aPTT 66-102 seconds Monitor platelets by anticoagulation protocol: Yes   Plan:  APTT is therapeutic. Will continue heparin infusion to 1600 units/hr.  Recheck aPTT in 8 hours. Will monitor with aPTT as heparin level is falsely elevated, due to apixaban, will switch to heparin level once aPTT and heparin level correlate. CBC and heparin level with AM labs.  Renda Rolls, PharmD, [facility] 07/09/2020 4:54 PM

## 2020-07-09 NOTE — Progress Notes (Addendum)
PROGRESS NOTE    Cody Price  ZOX:[id number] DOB: 13-May-1941 DOA: 07/08/2020 PCP: Baxter Hire, MD    Assessment & Plan:   Principal Problem:   Acute on chronic diastolic CHF (congestive heart failure) ([place]) Active Problems:   ATRIAL FIBRILLATION   Diabetes mellitus ([place])   Chronic idiopathic thrombocytopenia ([facility])   Hypertension   Chest pain    Acute on chronic diastolic CHF exacerbation: echo showed EF 55-60% in 2019. Continue on IV lasix. Monitor I/Os. Continue on lisinopril & started on metoprolol as per cardio. Repeat echo shows EF 81-19%, diastolic parameters are indeterminate, mod-severe mitral regurgitation, & mild LV hypertrophy. Cardiac cath tomorrow   Chest pain: w/ hx of CAD. Not on aspirin secondary to chronic thrombocytopenia.  Continue on statin. Continue to hold eliquis while on IV heparin drip. Troponins are minimally elevated  Permanent a. fib: started on metoprolol, d/c cardizem & continue to hold eliquis while on IV heparin drip. Continue on tele   Chronic idiopathic thrombocytopenia: stable. Will continue to monitor   DM2: continue on SSI w/ accuchecks. Carb modified diet   BPH: continue on home dose of flomax, finasteride   GERD: continue on PPI     DVT prophylaxis: heparin drip  Code Status: DNR Family Communication:  Disposition Plan: likely d/c back home, depends on PT/OT recs   Status is: Inpatient  Remains inpatient appropriate because:Ongoing diagnostic testing needed not appropriate for outpatient work up, Unsafe d/c plan and IV treatments appropriate due to intensity of illness or inability to take PO, cardiac cath tomorrow    Dispo: The patient is from: Home              Anticipated d/c is to: Home              Anticipated d/c date is: 3 days              Patient currently is not medically stable to d/c.     Consultants:   cardio   Procedures:   Antimicrobials:   Subjective: Pt c/o  fatigue  Objective: Vitals:   07/08/20 1930 07/08/20 2057 07/09/20 0413 07/09/20 0417  BP:  (!) 143/85 120/68   Pulse: 60 (!) 47 (!) 52   Resp: 19 18 18    Temp:  98 F (36.7 C) 97.7 F (36.5 C)   TempSrc:  Oral Oral   SpO2: 93% 96% 99%   Weight:    109 kg  Height:        Intake/Output Summary (Last 24 hours) at 07/09/2020 0720 Last data filed at 07/09/2020 0650 Gross per 24 hour  Intake 358.63 ml  Output 1650 ml  Net -1291.37 ml   Filed Weights   07/08/20 0804 07/09/20 0417  Weight: 114.8 kg 109 kg    Examination:  General exam: Appears calm and comfortable  Respiratory system: diminished breath sounds b/l  Cardiovascular system: S1 & S2 +. No rubs, gallops or clicks.  Gastrointestinal system: Abdomen is nondistended, soft and nontender. Normal bowel sounds heard. Central nervous system: Alert and oriented. Moves all 4 extremities Psychiatry: Judgement and insight appear normal. Flat mood and affect.     Data Reviewed: I have personally reviewed following labs and imaging studies  CBC: Recent Labs  Lab 07/08/20 0810 07/09/20 0604  WBC 6.3 7.2  NEUTROABS 3.4  --   HGB 12.1* 11.4*  HCT 36.5* 33.9*  MCV 90.6 90.2  PLT 78* 79*   Basic Metabolic Panel: Recent Labs  Lab  07/08/20 0810 07/08/20 1424 07/09/20 0604  NA 139  --  139  K 3.5  --  3.5  CL 104  --  103  CO2 25  --  28  GLUCOSE 192*  --  175*  BUN 13  --  19  CREATININE 0.87  --  1.09  CALCIUM 8.6*  --  8.5*  MG  --  1.6*  --    GFR: Estimated Creatinine Clearance: 72.3 mL/min (by C-G formula based on SCr of 1.09 mg/dL). Liver Function Tests: Recent Labs  Lab 07/08/20 0810  AST 15  ALT 16  ALKPHOS 51  BILITOT 1.9*  PROT 6.7  ALBUMIN 4.0   No results for input(s): LIPASE, AMYLASE in the last 168 hours. No results for input(s): AMMONIA in the last 168 hours. Coagulation Profile: No results for input(s): INR, PROTIME in the last 168 hours. Cardiac Enzymes: No results for input(s):  CKTOTAL, CKMB, CKMBINDEX, TROPONINI in the last 168 hours. BNP (last 3 results) No results for input(s): PROBNP in the last 8760 hours. HbA1C: Recent Labs    07/08/20 1424  HGBA1C 6.5*   CBG: Recent Labs  Lab 07/08/20 1721 07/08/20 2107  GLUCAP 148* 149*   Lipid Profile: No results for input(s): CHOL, HDL, LDLCALC, TRIG, CHOLHDL, LDLDIRECT in the last 72 hours. Thyroid Function Tests: Recent Labs    07/08/20 1424  TSH 1.197   Anemia Panel: No results for input(s): VITAMINB12, FOLATE, FERRITIN, TIBC, IRON, RETICCTPCT in the last 72 hours. Sepsis Labs: No results for input(s): PROCALCITON, LATICACIDVEN in the last 168 hours.  Recent Results (from the past 240 hour(s))  Respiratory Panel by RT PCR (Flu A&B, Covid) - Nasopharyngeal Swab     Status: None   Collection Time: 07/08/20  8:10 AM   Specimen: Nasopharyngeal Swab  Result Value Ref Range Status   SARS Coronavirus 2 by RT PCR NEGATIVE NEGATIVE Final    Comment: (NOTE) SARS-CoV-2 target nucleic acids are NOT DETECTED.  The SARS-CoV-2 RNA is generally detectable in upper respiratoy specimens during the acute phase of infection. The lowest concentration of SARS-CoV-2 viral copies this assay can detect is 131 copies/mL. A negative result does not preclude SARS-Cov-2 infection and should not be used as the sole basis for treatment or other patient management decisions. A negative result may occur with  improper specimen collection/handling, submission of specimen other than nasopharyngeal swab, presence of viral mutation(s) within the areas targeted by this assay, and inadequate number of viral copies (<131 copies/mL). A negative result must be combined with clinical observations, patient history, and epidemiological information. The expected result is Negative.  Fact Sheet for Patients:  PinkCheek.be  Fact Sheet for Healthcare Providers:   GravelBags.it  This test is no t yet approved or cleared by the [place] FDA and  has been authorized for detection and/or diagnosis of SARS-CoV-2 by FDA under an Emergency Use Authorization (EUA). This EUA will remain  in effect (meaning this test can be used) for the duration of the COVID-19 declaration under Section 564(b)(1) of the Act, 21 U.S.C. section 360bbb-3(b)(1), unless the authorization is terminated or revoked sooner.     Influenza A by PCR NEGATIVE NEGATIVE Final   Influenza B by PCR NEGATIVE NEGATIVE Final    Comment: (NOTE) The Xpert Xpress SARS-CoV-2/FLU/RSV assay is intended as an aid in  the diagnosis of influenza from Nasopharyngeal swab specimens and  should not be used as a sole basis for treatment. Nasal washings and  aspirates are  unacceptable for Xpert Xpress SARS-CoV-2/FLU/RSV  testing.  Fact Sheet for Patients: PinkCheek.be  Fact Sheet for Healthcare Providers: GravelBags.it  This test is not yet approved or cleared by the [place] FDA and  has been authorized for detection and/or diagnosis of SARS-CoV-2 by  FDA under an Emergency Use Authorization (EUA). This EUA will remain  in effect (meaning this test can be used) for the duration of the  Covid-19 declaration under Section 564(b)(1) of the Act, 21  U.S.C. section 360bbb-3(b)(1), unless the authorization is  terminated or revoked. Performed at [facility], [address] [place], [place] [address]          Radiology Studies: DG Chest Portable 1 View  Result Date: 07/08/2020 CLINICAL DATA:  Shortness of breath. EXAM: PORTABLE CHEST 1 VIEW COMPARISON:  06/05/2018. FINDINGS: Cardiomegaly with diffuse bilateral pulmonary interstitial prominence consistent with CHF. Pneumonitis cannot be excluded. No prominent pleural effusion. No pneumothorax. IMPRESSION: Cardiomegaly with diffuse  bilateral pulmonary interstitial prominence consistent with CHF. Electronically Signed   By: Marcello Moores  Register   On: 07/08/2020 08:42   ECHOCARDIOGRAM COMPLETE  Result Date: 07/08/2020    ECHOCARDIOGRAM REPORT   Patient Name:   JANUEL DOOLAN Amer Date of Exam: 07/08/2020 Medical Rec #:  [id number]      Height:       73.0 in Accession #:    [id number]     Weight:       253.0 lb Date of Birth:  02-Aug-1941       BSA:          2.378 m Patient Age:    79 years       BP:           105/69 mmHg Patient Gender: M              HR:           88 bpm. Exam Location:  [facility] Procedure: 2D Echo, Cardiac Doppler and Color Doppler Indications:     CHF- acute diastolic 283.15  History:         Patient has prior history of Echocardiogram examinations, most                  recent 06/06/2018. Previous Myocardial Infarction; Risk                  Factors:Diabetes. Pulmonary embolus.  Sonographer:     Sherrie Sport RDCS (AE) Referring Phys:  [id number] Collier Bullock Diagnosing Phys: Nelva Bush MD  Sonographer Comments: Image acquisition challenging due to patient body habitus. IMPRESSIONS  1. Left ventricular ejection fraction, by estimation, is 45 to 50%. The left ventricle has mildly decreased function. Left ventricular endocardial border not optimally defined to evaluate regional wall motion. There is mild left ventricular hypertrophy.  Left ventricular diastolic parameters are indeterminate.  2. Right ventricular systolic function is normal. The right ventricular size is mildly enlarged. Mildly increased right ventricular wall thickness. There is moderately elevated pulmonary artery systolic pressure.  3. Left atrial size was mildly dilated.  4. Right atrial size was mildly dilated.  5. The pericardial effusion is posterior to the left ventricle.  6. The mitral valve is degenerative. Moderate to severe mitral valve regurgitation. No evidence of mitral stenosis.  7. The aortic valve is tricuspid. There is mild calcification of the aortic  valve. There is mild thickening of the aortic valve. Aortic valve regurgitation is not visualized. Mild to moderate aortic valve sclerosis/calcification is present, without  any evidence of aortic stenosis.  8. Aortic dilatation noted. There is borderline dilatation of the aortic root, measuring 38 mm. FINDINGS  Left Ventricle: Left ventricular ejection fraction, by estimation, is 45 to 50%. The left ventricle has mildly decreased function. Left ventricular endocardial border not optimally defined to evaluate regional wall motion. The left ventricular internal cavity size was normal in size. There is mild left ventricular hypertrophy. Left ventricular diastolic parameters are indeterminate. Right Ventricle: The right ventricular size is mildly enlarged. Mildly increased right ventricular wall thickness. Right ventricular systolic function is normal. There is moderately elevated pulmonary artery systolic pressure. Left Atrium: Left atrial size was mildly dilated. Right Atrium: Right atrial size was mildly dilated. Pericardium: Trivial pericardial effusion is present. The pericardial effusion is posterior to the left ventricle. Mitral Valve: The mitral valve is degenerative in appearance. There is mild thickening of the mitral valve leaflet(s). Moderate to severe mitral valve regurgitation. No evidence of mitral valve stenosis. Tricuspid Valve: The tricuspid valve is not well visualized. Tricuspid valve regurgitation is trivial. Aortic Valve: The aortic valve is tricuspid. There is mild calcification of the aortic valve. There is mild thickening of the aortic valve. There is mild aortic valve annular calcification. Aortic valve regurgitation is not visualized. Mild to moderate aortic valve sclerosis/calcification is present, without any evidence of aortic stenosis. Aortic valve mean gradient measures 2.5 mmHg. Aortic valve peak gradient measures 4.4 mmHg. Aortic valve area, by VTI measures 3.60 cm. Pulmonic Valve: The  pulmonic valve was not well visualized. Pulmonic valve regurgitation is mild. No evidence of pulmonic stenosis. Aorta: Aortic dilatation noted. There is borderline dilatation of the aortic root, measuring 38 mm. Pulmonary Artery: The pulmonary artery is not well seen. Venous: The inferior vena cava was not well visualized. IAS/Shunts: The interatrial septum was not well visualized.  LEFT VENTRICLE PLAX 2D LVIDd:         5.16 cm LVIDs:         3.69 cm LV PW:         1.03 cm LV IVS:        0.95 cm LVOT diam:     2.00 cm LV SV:         58 LV SV Index:   25 LVOT Area:     3.14 cm  RIGHT VENTRICLE RV Basal diam:  4.31 cm RV S prime:     13.50 cm/s TAPSE (M-mode): 2.1 cm LEFT ATRIUM           Index       RIGHT ATRIUM           Index LA diam:      4.30 cm 1.81 cm/m  RA Area:     22.90 cm LA Vol (A4C): 65.9 ml 27.72 ml/m RA Volume:   73.60 ml  30.95 ml/m  AORTIC VALVE                   PULMONIC VALVE AV Area (Vmax):    2.43 cm    PV Vmax:        0.64 m/s AV Area (Vmean):   2.68 cm    PV Peak grad:   1.7 mmHg AV Area (VTI):     3.60 cm    RVOT Peak grad: 3 mmHg AV Vmax:           104.40 cm/s AV Vmean:          65.950 cm/s AV VTI:  0.162 m AV Peak Grad:      4.4 mmHg AV Mean Grad:      2.5 mmHg LVOT Vmax:         80.90 cm/s LVOT Vmean:        56.300 cm/s LVOT VTI:          0.186 m LVOT/AV VTI ratio: 1.14  AORTA Ao Root diam: 3.37 cm MITRAL VALVE                TRICUSPID VALVE MV Area (PHT): 4.21 cm     TR Peak grad:   44.4 mmHg MV Decel Time: 180 msec     TR Vmax:        333.00 cm/s MV E velocity: 102.00 cm/s                             SHUNTS                             Systemic VTI:  0.19 m                             Systemic Diam: 2.00 cm Nelva Bush MD Electronically signed by Nelva Bush MD Signature Date/Time: 07/08/2020/6:03:33 PM    Final         Scheduled Meds: . aspirin EC  81 mg Oral Daily  . atorvastatin  40 mg Oral q1800  . cyanocobalamin  1,000 mcg Intramuscular Q30 days  .  diltiazem  180 mg Oral QAC breakfast  . finasteride  5 mg Oral Daily  . furosemide  40 mg Intravenous Q12H  . insulin aspart  0-15 Units Subcutaneous TID WC  . lisinopril  10 mg Oral Daily  . multivitamin with minerals  1 tablet Oral Daily  . omega-3 acid ethyl esters  2 g Oral Daily  . pantoprazole  40 mg Oral Daily  . potassium chloride SA  20 mEq Oral Daily  . sodium chloride flush  3 mL Intravenous Q12H  . sucralfate  1 g Oral QID  . tamsulosin  0.4 mg Oral Daily   Continuous Infusions: . sodium chloride    . heparin 1,400 Units/hr (07/09/20 0650)     LOS: 1 day    Time spent: 33 mins     Wyvonnia Dusky, MD [facility] Pager [phone]  If 7PM-7AM, please contact night-coverage [email or web address] 07/09/2020, 7:20 AM

## 2020-07-10 DIAGNOSIS — I4821 Permanent atrial fibrillation: Secondary | ICD-10-CM | POA: Diagnosis not present

## 2020-07-10 DIAGNOSIS — I5033 Acute on chronic diastolic (congestive) heart failure: Secondary | ICD-10-CM | POA: Diagnosis not present

## 2020-07-10 DIAGNOSIS — D693 Immune thrombocytopenic purpura: Secondary | ICD-10-CM

## 2020-07-10 DIAGNOSIS — I25118 Atherosclerotic heart disease of native coronary artery with other forms of angina pectoris: Secondary | ICD-10-CM

## 2020-07-10 DIAGNOSIS — E1159 Type 2 diabetes mellitus with other circulatory complications: Secondary | ICD-10-CM

## 2020-07-10 DIAGNOSIS — R079 Chest pain, unspecified: Secondary | ICD-10-CM | POA: Diagnosis not present

## 2020-07-10 LAB — CBC
HCT: 34.3 % — ABNORMAL LOW (ref 39.0–52.0)
Hemoglobin: 11.6 g/dL — ABNORMAL LOW (ref 13.0–17.0)
MCH: 30.5 pg (ref 26.0–34.0)
MCHC: 33.8 g/dL (ref 30.0–36.0)
MCV: 90.3 fL (ref 80.0–100.0)
Platelets: 78 10*3/uL — ABNORMAL LOW (ref 150–400)
RBC: 3.8 MIL/uL — ABNORMAL LOW (ref 4.22–5.81)
RDW: 15.2 % (ref 11.5–15.5)
WBC: 7.1 10*3/uL (ref 4.0–10.5)
nRBC: 0 % (ref 0.0–0.2)

## 2020-07-10 LAB — HEPARIN LEVEL (UNFRACTIONATED)
Heparin Unfractionated: 0.96 IU/mL — ABNORMAL HIGH (ref 0.30–0.70)
Heparin Unfractionated: 1.01 IU/mL — ABNORMAL HIGH (ref 0.30–0.70)

## 2020-07-10 LAB — BASIC METABOLIC PANEL
Anion gap: 9 (ref 5–15)
BUN: 22 mg/dL (ref 8–23)
CO2: 27 mmol/L (ref 22–32)
Calcium: 8.5 mg/dL — ABNORMAL LOW (ref 8.9–10.3)
Chloride: 102 mmol/L (ref 98–111)
Creatinine, Ser: 1.1 mg/dL (ref 0.61–1.24)
GFR, Estimated: >60 mL/min (ref >60)
Glucose, Bld: 186 mg/dL — ABNORMAL HIGH (ref 70–99)
Potassium: 3.8 mmol/L (ref 3.5–5.1)
Sodium: 138 mmol/L (ref 135–145)

## 2020-07-10 LAB — GLUCOSE, CAPILLARY
Glucose-Capillary: 166 mg/dL — ABNORMAL HIGH (ref 70–99)
Glucose-Capillary: 190 mg/dL — ABNORMAL HIGH (ref 70–99)
Glucose-Capillary: 99 mg/dL (ref 70–99)
Glucose-Capillary: 135 mg/dL — ABNORMAL HIGH (ref 70–99)

## 2020-07-10 LAB — APTT: aPTT: 80 seconds — ABNORMAL HIGH (ref 24–36)

## 2020-07-10 NOTE — Plan of Care (Signed)

## 2020-07-10 NOTE — Progress Notes (Signed)
Progress Note  Patient Name: Cody Price Date of Encounter: 07/10/2020  [facility] Cardiologist: Ida Rogue, MD   Subjective   He improved chest pressure across his anterior chest when compared with yesterday.  He states his breathing is better and he feels as if the fluid is off of him.  He has just received some pain medicine for his chest pain.  He does not report very much urine output today.  Plans for cardiac catheterization 11/5 discussed with patient understanding. Risks and benefits of cardiac catheterization have been discussed with the patient.  These include bleeding, infection, kidney damage, stroke, heart attack, death.  The patient understands these risks and is willing to proceed.  Inpatient Medications    Scheduled Meds: . aspirin EC  81 mg Oral Daily  . atorvastatin  40 mg Oral q1800  . cyanocobalamin  1,000 mcg Intramuscular Q30 days  . finasteride  5 mg Oral Daily  . furosemide  40 mg Intravenous Q12H  . insulin aspart  0-15 Units Subcutaneous TID WC  . lisinopril  10 mg Oral Daily  . metoprolol succinate  50 mg Oral Daily  . multivitamin with minerals  1 tablet Oral Daily  . omega-3 acid ethyl esters  2 g Oral Daily  . pantoprazole  40 mg Oral Daily  . potassium chloride SA  20 mEq Oral Daily  . sodium chloride flush  3 mL Intravenous Q12H  . sucralfate  1 g Oral QID  . tamsulosin  0.4 mg Oral Daily   Continuous Infusions: . sodium chloride    . heparin 1,600 Units/hr (07/10/20 0721)   PRN Meds: sodium chloride, acetaminophen, diazepam, ondansetron (ZOFRAN) IV, oxyCODONE-acetaminophen **AND** oxyCODONE, sodium chloride flush   Vital Signs    Vitals:   07/10/20 0406 07/10/20 0811 07/10/20 1007 07/10/20 1139  BP: (!) 102/52 (!) 102/51 103/62 102/62  Pulse: (!) 53 (!) 57 67 (!) 58  Resp: 16 17  17   Temp: 97.9 F (36.6 C) 98.2 F (36.8 C)  97.9 F (36.6 C)  TempSrc: Oral Oral  Oral  SpO2: 99% 98%  99%  Weight: 111.6 kg     Height:         Intake/Output Summary (Last 24 hours) at 07/10/2020 1318 Last data filed at 07/10/2020 1140 Gross per 24 hour  Intake 544.62 ml  Output 1240 ml  Net -695.38 ml   Last 3 Weights 07/10/2020 07/09/2020 07/08/2020  Weight (lbs) 246 lb 240 lb 4.8 oz 253 lb  Weight (kg) 111.585 kg 108.999 kg 114.76 kg      Telemetry    Atrial fibrillation, ventricular rates 70s to 80s, PVCs- Personally Reviewed  ECG    No new tracings - Personally Reviewed  Physical Exam   GEN: No acute distress.  Lying in bed. Neck: No JVD Cardiac: IRIR, 1/6 systolic murmur, rubs, or gallops.  Respiratory:  Reduced breath sounds bilaterally. GI: Soft, nontender, non-distended  MS: No edema; No deformity. Neuro:  Nonfocal  Psych: Normal affect   Labs    High Sensitivity Troponin:   Recent Labs  Lab 07/08/20 0810 07/08/20 1424 07/08/20 1600  TROPONINIHS 8 21* 23*      Chemistry Recent Labs  Lab 07/08/20 0810 07/09/20 0604 07/10/20 0005  NA 139 139 138  K 3.5 3.5 3.8  CL 104 103 102  CO2 25 28 27   GLUCOSE 192* 175* 186*  BUN 13 19 22   CREATININE 0.87 1.09 1.10  CALCIUM 8.6* 8.5* 8.5*  PROT  6.7  --   --   ALBUMIN 4.0  --   --   AST 15  --   --   ALT 16  --   --   ALKPHOS 51  --   --   BILITOT 1.9*  --   --   GFRNONAA >60 >60 >60  ANIONGAP 10 8 9      Hematology Recent Labs  Lab 07/08/20 0810 07/09/20 0604 07/10/20 0005  WBC 6.3 7.2 7.1  RBC 4.03* 3.76* 3.80*  HGB 12.1* 11.4* 11.6*  HCT 36.5* 33.9* 34.3*  MCV 90.6 90.2 90.3  MCH 30.0 30.3 30.5  MCHC 33.2 33.6 33.8  RDW 15.1 15.1 15.2  PLT 78* 79* 78*    BNP Recent Labs  Lab 07/08/20 0810  BNP 336.2*     DDimer No results for input(s): DDIMER in the last 168 hours.   Radiology    ECHOCARDIOGRAM COMPLETE  Result Date: 07/08/2020    ECHOCARDIOGRAM REPORT   Patient Name:   Cody Price Date of Exam: 07/08/2020 Medical Rec #:  [id number]      Height:       73.0 in Accession #:    [id number]     Weight:       253.0 lb  Date of Birth:  09-Aug-1941       BSA:          2.378 m Patient Age:    79 years       BP:           105/69 mmHg Patient Gender: M              HR:           88 bpm. Exam Location:  [facility] Procedure: 2D Echo, Cardiac Doppler and Color Doppler Indications:     CHF- acute diastolic 500.37  History:         Patient has prior history of Echocardiogram examinations, most                  recent 06/06/2018. Previous Myocardial Infarction; Risk                  Factors:Diabetes. Pulmonary embolus.  Sonographer:     Sherrie Sport RDCS (AE) Referring Phys:  [id number] Collier Bullock Diagnosing Phys: Nelva Bush MD  Sonographer Comments: Image acquisition challenging due to patient body habitus. IMPRESSIONS  1. Left ventricular ejection fraction, by estimation, is 45 to 50%. The left ventricle has mildly decreased function. Left ventricular endocardial border not optimally defined to evaluate regional wall motion. There is mild left ventricular hypertrophy.  Left ventricular diastolic parameters are indeterminate.  2. Right ventricular systolic function is normal. The right ventricular size is mildly enlarged. Mildly increased right ventricular wall thickness. There is moderately elevated pulmonary artery systolic pressure.  3. Left atrial size was mildly dilated.  4. Right atrial size was mildly dilated.  5. The pericardial effusion is posterior to the left ventricle.  6. The mitral valve is degenerative. Moderate to severe mitral valve regurgitation. No evidence of mitral stenosis.  7. The aortic valve is tricuspid. There is mild calcification of the aortic valve. There is mild thickening of the aortic valve. Aortic valve regurgitation is not visualized. Mild to moderate aortic valve sclerosis/calcification is present, without any evidence of aortic stenosis.  8. Aortic dilatation noted. There is borderline dilatation of the aortic root, measuring 38 mm. FINDINGS  Left Ventricle: Left ventricular ejection fraction, by estimation,  is 45 to 50%. The left ventricle has mildly decreased function. Left ventricular endocardial border not optimally defined to evaluate regional wall motion. The left ventricular internal cavity size was normal in size. There is mild left ventricular hypertrophy. Left ventricular diastolic parameters are indeterminate. Right Ventricle: The right ventricular size is mildly enlarged. Mildly increased right ventricular wall thickness. Right ventricular systolic function is normal. There is moderately elevated pulmonary artery systolic pressure. Left Atrium: Left atrial size was mildly dilated. Right Atrium: Right atrial size was mildly dilated. Pericardium: Trivial pericardial effusion is present. The pericardial effusion is posterior to the left ventricle. Mitral Valve: The mitral valve is degenerative in appearance. There is mild thickening of the mitral valve leaflet(s). Moderate to severe mitral valve regurgitation. No evidence of mitral valve stenosis. Tricuspid Valve: The tricuspid valve is not well visualized. Tricuspid valve regurgitation is trivial. Aortic Valve: The aortic valve is tricuspid. There is mild calcification of the aortic valve. There is mild thickening of the aortic valve. There is mild aortic valve annular calcification. Aortic valve regurgitation is not visualized. Mild to moderate aortic valve sclerosis/calcification is present, without any evidence of aortic stenosis. Aortic valve mean gradient measures 2.5 mmHg. Aortic valve peak gradient measures 4.4 mmHg. Aortic valve area, by VTI measures 3.60 cm. Pulmonic Valve: The pulmonic valve was not well visualized. Pulmonic valve regurgitation is mild. No evidence of pulmonic stenosis. Aorta: Aortic dilatation noted. There is borderline dilatation of the aortic root, measuring 38 mm. Pulmonary Artery: The pulmonary artery is not well seen. Venous: The inferior vena cava was not well visualized. IAS/Shunts: The interatrial septum was not well  visualized.  LEFT VENTRICLE PLAX 2D LVIDd:         5.16 cm LVIDs:         3.69 cm LV PW:         1.03 cm LV IVS:        0.95 cm LVOT diam:     2.00 cm LV SV:         58 LV SV Index:   25 LVOT Area:     3.14 cm  RIGHT VENTRICLE RV Basal diam:  4.31 cm RV S prime:     13.50 cm/s TAPSE (M-mode): 2.1 cm LEFT ATRIUM           Index       RIGHT ATRIUM           Index LA diam:      4.30 cm 1.81 cm/m  RA Area:     22.90 cm LA Vol (A4C): 65.9 ml 27.72 ml/m RA Volume:   73.60 ml  30.95 ml/m  AORTIC VALVE                   PULMONIC VALVE AV Area (Vmax):    2.43 cm    PV Vmax:        0.64 m/s AV Area (Vmean):   2.68 cm    PV Peak grad:   1.7 mmHg AV Area (VTI):     3.60 cm    RVOT Peak grad: 3 mmHg AV Vmax:           104.40 cm/s AV Vmean:          65.950 cm/s AV VTI:            0.162 m AV Peak Grad:      4.4 mmHg AV Mean Grad:      2.5 mmHg LVOT Vmax:  80.90 cm/s LVOT Vmean:        56.300 cm/s LVOT VTI:          0.186 m LVOT/AV VTI ratio: 1.14  AORTA Ao Root diam: 3.37 cm MITRAL VALVE                TRICUSPID VALVE MV Area (PHT): 4.21 cm     TR Peak grad:   44.4 mmHg MV Decel Time: 180 msec     TR Vmax:        333.00 cm/s MV E velocity: 102.00 cm/s                             SHUNTS                             Systemic VTI:  0.19 m                             Systemic Diam: 2.00 cm Nelva Bush MD Electronically signed by Nelva Bush MD Signature Date/Time: 07/08/2020/6:03:33 PM    Final     Cardiac Studies   LHC 06/07/2018 Conclusions: 1. Severe 2-vessel coronary artery disease, including 90% mid LAD stenosis involving small D1 branch just beyond old stent and chronic total occlusion of the distal RCA. Severe apical LAD disease is stable from prior catheterization. 2. Moderate, non-obstructive disease involving the LCx. 3. Patent proximal LAD stent with mild in-stent restenosis. 4. Mildly elevated left ventricular filling pressure. 5. Successful PCI to mid LAD using Resolute Onyx 2.5 x 18 mm  drug-eluting stent with 0% residual stenosis and TIMI-3 flow. Recommendations: 1. Medical therapy for chronic total occlusion of RCA and small diagonal branch. 2. Aggressive secondary prevention. 3. Restart heparin infusion 2 hours after deflation of TR band. Recommend to resume Apixaban, at currently prescribed dose and frequency, on 06/08/18 if no evidence of bleeding or vascular injury. Recommend concurrent antiplatelet therapy of Clopidogrel 75mg  daily for 12 months. Nelva Bush, MD [facility]  Echo 06/06/2018 Left ventricle: The cavity size was normal. There was moderate  focal basal hypertrophy of the septum. Systolic function was  normal. The estimated ejection fraction was in the range of 55%  to 60%. There is hypokinesis of the basalinferior myocardium.  Doppler parameters are consistent with restrictive physiology,  indicative of decreased left ventricular diastolic compliance  and/or increased left atrial pressure. Doppler parameters are  consistent with high ventricular filling pressure.  - Mitral valve: Mildly thickened leaflets . There was moderate  regurgitation.  - Left atrium: The atrium was mildly dilated.  - Right ventricle: The cavity size was normal. Wall thickness was  normal. Systolic function was normal.  - Right atrium: The atrium was mildly dilated.  - Pulmonary arteries: Systolic pressure was moderately increased,  in the range of 40 mm Hg to 45 mm Hg plus central venous/right  atrial pressure.  - Pericardium, extracardiac: A trivial pericardial effusion was  identified posterior to the heart.   2015 LHC: Occluded proximal RCA (chronic), mild to moderate proximal left circumflex, moderate distal left circumflex (small vessel), and moderate proximal OM disease (moderate-sized vessel).  LAD proximal stent is patent.  Normal EF estimated at greater than 55%.  No significant MR or AS.  Medical management recommended.  Restart  Plavix.   Patient Profile     79 y.o. male  with a hx of CAD s/p PCI (1991 stent to RCA later occluded 100%, 9/2010DES toLAD, 2015 LHCwith restart of Plavix, 06/2018 LHC with PCI to mLAD and medical tx of RCA and small diagonal branch), asx PAF on Eliquis, HFpEF (EF 55-60%), moderate MR, pulmonary HTN, HTN, HLD with history of statin intolerance, DM2 with lower extremity neuropathy, spinal stenosis s/p surgery, GERD, and prior smoker who is being seen today for the evaluation of acute on chronic HFrEF and CP.  Assessment & Plan    AOC HFrEF (EF 45-50%) --Improvement in shortness of breath with diuresis --Echo shows reduced EF 45 to 50%.  Mild LVH. --Given his reduced EF on recent echo, further ischemic work-up recommended at this time.  R/LHC scheduled for 11/5.  --Volume status improved today when compared with yesterday. --Output -1.3L but net -271.1cc yesterday.  Weight 109 kg  111.6kg. --Continue IV diuresis until euvolemic on exam or bump in renal function.   --Renal function stable from yesterday. Cr 1.09  1.10 with BUN 19  22. --Daily BMET.  Maintain K 4.0.  Mg 2.0.     --Continue current medications.   --Further recommendations, if needed, pending right and left heart cath.  Chest pain  H/o CAD s/p PCI --Current anterior CP/pressure at rest.  Constant..  Improved from yesterday. --He has several risk factors for cardiac etiology, including known history of CAD and previous history of smoking --High-sensitivity troponin 8.  EKG without acute ST/T changes.   --Echo shows reduced EF as above; therefore, with his constant chest pain, further ischemic work-up recommended at this time. --Eliquis held in preparation for LHC this admission/48-hour washout.  --Last dose of Eliquis 11/2 in the PM. --Continue IV heparin.   --Continue ASA 81 mg daily, BB, ACE, statin. --Intolerant to sublingual nitro. --NPO after midnight.  --Risks and benefits of cardiac catheterization have been  discussed with the patient.  These include bleeding, infection, kidney damage, stroke, heart attack, death.  The patient understands these risks and is willing to proceed. --Further recommendations, if indicated, pending right and left heart cath.  Hypokalemia --K 3.8. --Replete with goal 4.0.   --Daily BMET.  Permanent Atrial Fibrillation on anticoagulation --Remains in asymptomatic atrial fibrillation.   --Toprol 50 mg daily started 11/4. --Continue IV heparin for anticoagulation.   --Eliquis on hold given scheduled R/LHC.  Continue to hold for 48h washout. --Reports compliance with Eliquis 5 mg BID.  CHA2DS2VASc score of at least6 (DD, HTN, agex2, DM2, vascular) with recommendation for indefinite anticoagulation.  Will need restart of Eliquis before discharge.  No plan for cardioversion at this time, as he is permanent asymptomatic atrial fibrillation. --Restart anticoagulation before discharge.  PVCs --Asymptomatic.   --EF reduced on most recent echo.  --If further ischemic work-up unrevealing, consider outpatient ZIO monitoring to assess burden of PVCs. --Diltiazem discontinued given reduced EF.   --Toprol started 11/4.   --Replete potassium as above.    HTN - BP currently controlled. - Continue Toprol 50 mg started 11/4. - Continue lisinopril 10mg  daily - Continue to monitor  HLD -Continue atorvastatin 40 mg daily. - Goal LDL <70  Thrombocytopenia, anemia --H&H stable.   --Previously seen by [facility] hematology for thrombocytopenia. --Denies signs and symptoms of bleeding. - Daily CBC.  Continue to monitor.  [id number] complication, on long term insulin - SSI --A1c 6.6. - Documented neuropathy - Per IM, PCP   HEAR Score (for undifferentiated chest pain):  HEAR Score: 6   New York Heart Association (NYHA) Functional  Class NYHA Class III III-IV   CHA2DS2-VASc Score = 6  This indicates a 9.7% annual risk of stroke. The patient's score is based upon: CHF  History: 1 HTN History: 1 Diabetes History: 1 Stroke History: 0 Vascular Disease History: 1 Age Score: 2 Gender Score: 0  For questions or updates, please contact [facility] Please consult [email or web address] for contact info under        Signed, Arvil Chaco, PA-C  07/10/2020, 1:18 PM

## 2020-07-10 NOTE — Progress Notes (Signed)
PROGRESS NOTE    Cody Price  ERD:[id number] DOB: 09-Jan-1941 DOA: 07/08/2020 PCP: Baxter Hire, MD    Assessment & Plan:   Principal Problem:   Acute on chronic diastolic CHF (congestive heart failure) ([place]) Active Problems:   ATRIAL FIBRILLATION   Diabetes mellitus ([place])   Chronic idiopathic thrombocytopenia ([facility])   Hypertension   Chest pain    Acute on chronic diastolic CHF exacerbation: echo showed EF 55-60% in 2019. Continue on IV lasix. Monitor I/Os. Continue on lisinopril, on metoprolol as per cardio. Repeat echo shows EF 56-31%, diastolic parameters are indeterminate, mod-severe mitral regurgitation, & mild LV hypertrophy. Cardiac cath will be 07/11/20   Chest pain: w/ hx of CAD. Still w/ chest pain today. Not on aspirin secondary to chronic thrombocytopenia. Continue on statin. Continue to hold eliquis while on IV heparin drip. Troponins are minimally elevated  Permanent a. fib: continue on metoprolol & continue to hold home dose of eliquis while on IV heparin. Continue on tele  Chronic idiopathic thrombocytopenia: stable. No need for a transfusion   DM2: continue on SSI w/ accuchecks. Carb modified diet   BPH: continue on home dose of finasteride, flomax   GERD: continue on protonix     DVT prophylaxis: heparin drip  Code Status: DNR Family Communication:  Disposition Plan: likely d/c back home, depends on PT/OT recs   Status is: Inpatient  Remains inpatient appropriate because:Ongoing diagnostic testing needed not appropriate for outpatient work up, Unsafe d/c plan and IV treatments appropriate due to intensity of illness or inability to take PO, cardiac cath will be 07/11/20   Dispo: The patient is from: Home              Anticipated d/c is to: Home              Anticipated d/c date is: 3 days              Patient currently is not medically stable to d/c.     Consultants:   cardio   Procedures:   Antimicrobials:   Subjective: Pt  c/o chest & back pain   Objective: Vitals:   07/09/20 1940 07/09/20 2126 07/10/20 0017 07/10/20 0406  BP: (!) 103/58 106/68 (!) 102/54 (!) 102/52  Pulse: (!) 52 61 (!) 52 (!) 53  Resp: 20 18 18 16   Temp: 98.1 F (36.7 C) 97.8 F (36.6 C) 97.8 F (36.6 C) 97.9 F (36.6 C)  TempSrc: Oral Oral Oral Oral  SpO2: 99% 99% 100% 99%  Weight:    111.6 kg  Height:        Intake/Output Summary (Last 24 hours) at 07/10/2020 0716 Last data filed at 07/10/2020 0531 Gross per 24 hour  Intake 1043.84 ml  Output 1315 ml  Net -271.16 ml   Filed Weights   07/08/20 0804 07/09/20 0417 07/10/20 0406  Weight: 114.8 kg 109 kg 111.6 kg    Examination:  General exam: Appears calm but uncomfortable Respiratory system: decreased breath sounds b/l Cardiovascular system: SS1/S2+. No rubs or gallops   Gastrointestinal system: Abdomen is nondistended, soft and nontender. Normal bowel sounds heard. Central nervous system: Alert and oriented. Moves all 4 extremities  Psychiatry: Judgement and insight appear normal. Flat mood and affect.     Data Reviewed: I have personally reviewed following labs and imaging studies  CBC: Recent Labs  Lab 07/08/20 0810 07/09/20 0604 07/10/20 0005  WBC 6.3 7.2 7.1  NEUTROABS 3.4  --   --  HGB 12.1* 11.4* 11.6*  HCT 36.5* 33.9* 34.3*  MCV 90.6 90.2 90.3  PLT 78* 79* 78*   Basic Metabolic Panel: Recent Labs  Lab 07/08/20 0810 07/08/20 1424 07/09/20 0604 07/10/20 0005  NA 139  --  139 138  K 3.5  --  3.5 3.8  CL 104  --  103 102  CO2 25  --  28 27  GLUCOSE 192*  --  175* 186*  BUN 13  --  19 22  CREATININE 0.87  --  1.09 1.10  CALCIUM 8.6*  --  8.5* 8.5*  MG  --  1.6*  --   --    GFR: Estimated Creatinine Clearance: 72.5 mL/min (by C-G formula based on SCr of 1.1 mg/dL). Liver Function Tests: Recent Labs  Lab 07/08/20 0810  AST 15  ALT 16  ALKPHOS 51  BILITOT 1.9*  PROT 6.7  ALBUMIN 4.0   No results for input(s): LIPASE, AMYLASE in the  last 168 hours. No results for input(s): AMMONIA in the last 168 hours. Coagulation Profile: No results for input(s): INR, PROTIME in the last 168 hours. Cardiac Enzymes: No results for input(s): CKTOTAL, CKMB, CKMBINDEX, TROPONINI in the last 168 hours. BNP (last 3 results) No results for input(s): PROBNP in the last 8760 hours. HbA1C: Recent Labs    07/08/20 1424  HGBA1C 6.5*   CBG: Recent Labs  Lab 07/08/20 2107 07/09/20 0741 07/09/20 1200 07/09/20 1613 07/09/20 1937  GLUCAP 149* 173* 199* 147* 172*   Lipid Profile: No results for input(s): CHOL, HDL, LDLCALC, TRIG, CHOLHDL, LDLDIRECT in the last 72 hours. Thyroid Function Tests: Recent Labs    07/08/20 1424  TSH 1.197   Anemia Panel: No results for input(s): VITAMINB12, FOLATE, FERRITIN, TIBC, IRON, RETICCTPCT in the last 72 hours. Sepsis Labs: No results for input(s): PROCALCITON, LATICACIDVEN in the last 168 hours.  Recent Results (from the past 240 hour(s))  Respiratory Panel by RT PCR (Flu A&B, Covid) - Nasopharyngeal Swab     Status: None   Collection Time: 07/08/20  8:10 AM   Specimen: Nasopharyngeal Swab  Result Value Ref Range Status   SARS Coronavirus 2 by RT PCR NEGATIVE NEGATIVE Final    Comment: (NOTE) SARS-CoV-2 target nucleic acids are NOT DETECTED.  The SARS-CoV-2 RNA is generally detectable in upper respiratoy specimens during the acute phase of infection. The lowest concentration of SARS-CoV-2 viral copies this assay can detect is 131 copies/mL. A negative result does not preclude SARS-Cov-2 infection and should not be used as the sole basis for treatment or other patient management decisions. A negative result may occur with  improper specimen collection/handling, submission of specimen other than nasopharyngeal swab, presence of viral mutation(s) within the areas targeted by this assay, and inadequate number of viral copies (<131 copies/mL). A negative result must be combined with  clinical observations, patient history, and epidemiological information. The expected result is Negative.  Fact Sheet for Patients:  PinkCheek.be  Fact Sheet for Healthcare Providers:  GravelBags.it  This test is no t yet approved or cleared by the [place] FDA and  has been authorized for detection and/or diagnosis of SARS-CoV-2 by FDA under an Emergency Use Authorization (EUA). This EUA will remain  in effect (meaning this test can be used) for the duration of the COVID-19 declaration under Section 564(b)(1) of the Act, 21 U.S.C. section 360bbb-3(b)(1), unless the authorization is terminated or revoked sooner.     Influenza A by PCR NEGATIVE NEGATIVE Final  Influenza B by PCR NEGATIVE NEGATIVE Final    Comment: (NOTE) The Xpert Xpress SARS-CoV-2/FLU/RSV assay is intended as an aid in  the diagnosis of influenza from Nasopharyngeal swab specimens and  should not be used as a sole basis for treatment. Nasal washings and  aspirates are unacceptable for Xpert Xpress SARS-CoV-2/FLU/RSV  testing.  Fact Sheet for Patients: PinkCheek.be  Fact Sheet for Healthcare Providers: GravelBags.it  This test is not yet approved or cleared by the [place] FDA and  has been authorized for detection and/or diagnosis of SARS-CoV-2 by  FDA under an Emergency Use Authorization (EUA). This EUA will remain  in effect (meaning this test can be used) for the duration of the  Covid-19 declaration under Section 564(b)(1) of the Act, 21  U.S.C. section 360bbb-3(b)(1), unless the authorization is  terminated or revoked. Performed at [facility], [address], [place], [place] [address]          Radiology Studies: DG Chest Portable 1 View  Result Date: 07/08/2020 CLINICAL DATA:  Shortness of breath. EXAM: PORTABLE CHEST 1 VIEW COMPARISON:  06/05/2018.  FINDINGS: Cardiomegaly with diffuse bilateral pulmonary interstitial prominence consistent with CHF. Pneumonitis cannot be excluded. No prominent pleural effusion. No pneumothorax. IMPRESSION: Cardiomegaly with diffuse bilateral pulmonary interstitial prominence consistent with CHF. Electronically Signed   By: Marcello Moores  Register   On: 07/08/2020 08:42   ECHOCARDIOGRAM COMPLETE  Result Date: 07/08/2020    ECHOCARDIOGRAM REPORT   Patient Name:   Cody Price Beckley Date of Exam: 07/08/2020 Medical Rec #:  [id number]      Height:       73.0 in Accession #:    [id number]     Weight:       253.0 lb Date of Birth:  1940-11-10       BSA:          2.378 m Patient Age:    25 years       BP:           105/69 mmHg Patient Gender: M              HR:           88 bpm. Exam Location:  [facility] Procedure: 2D Echo, Cardiac Doppler and Color Doppler Indications:     CHF- acute diastolic 132.44  History:         Patient has prior history of Echocardiogram examinations, most                  recent 06/06/2018. Previous Myocardial Infarction; Risk                  Factors:Diabetes. Pulmonary embolus.  Sonographer:     Sherrie Sport RDCS (AE) Referring Phys:  [id number] Collier Bullock Diagnosing Phys: Nelva Bush MD  Sonographer Comments: Image acquisition challenging due to patient body habitus. IMPRESSIONS  1. Left ventricular ejection fraction, by estimation, is 45 to 50%. The left ventricle has mildly decreased function. Left ventricular endocardial border not optimally defined to evaluate regional wall motion. There is mild left ventricular hypertrophy.  Left ventricular diastolic parameters are indeterminate.  2. Right ventricular systolic function is normal. The right ventricular size is mildly enlarged. Mildly increased right ventricular wall thickness. There is moderately elevated pulmonary artery systolic pressure.  3. Left atrial size was mildly dilated.  4. Right atrial size was mildly dilated.  5. The pericardial effusion is posterior  to the left ventricle.  6. The mitral valve is  degenerative. Moderate to severe mitral valve regurgitation. No evidence of mitral stenosis.  7. The aortic valve is tricuspid. There is mild calcification of the aortic valve. There is mild thickening of the aortic valve. Aortic valve regurgitation is not visualized. Mild to moderate aortic valve sclerosis/calcification is present, without any evidence of aortic stenosis.  8. Aortic dilatation noted. There is borderline dilatation of the aortic root, measuring 38 mm. FINDINGS  Left Ventricle: Left ventricular ejection fraction, by estimation, is 45 to 50%. The left ventricle has mildly decreased function. Left ventricular endocardial border not optimally defined to evaluate regional wall motion. The left ventricular internal cavity size was normal in size. There is mild left ventricular hypertrophy. Left ventricular diastolic parameters are indeterminate. Right Ventricle: The right ventricular size is mildly enlarged. Mildly increased right ventricular wall thickness. Right ventricular systolic function is normal. There is moderately elevated pulmonary artery systolic pressure. Left Atrium: Left atrial size was mildly dilated. Right Atrium: Right atrial size was mildly dilated. Pericardium: Trivial pericardial effusion is present. The pericardial effusion is posterior to the left ventricle. Mitral Valve: The mitral valve is degenerative in appearance. There is mild thickening of the mitral valve leaflet(s). Moderate to severe mitral valve regurgitation. No evidence of mitral valve stenosis. Tricuspid Valve: The tricuspid valve is not well visualized. Tricuspid valve regurgitation is trivial. Aortic Valve: The aortic valve is tricuspid. There is mild calcification of the aortic valve. There is mild thickening of the aortic valve. There is mild aortic valve annular calcification. Aortic valve regurgitation is not visualized. Mild to moderate aortic valve  sclerosis/calcification is present, without any evidence of aortic stenosis. Aortic valve mean gradient measures 2.5 mmHg. Aortic valve peak gradient measures 4.4 mmHg. Aortic valve area, by VTI measures 3.60 cm. Pulmonic Valve: The pulmonic valve was not well visualized. Pulmonic valve regurgitation is mild. No evidence of pulmonic stenosis. Aorta: Aortic dilatation noted. There is borderline dilatation of the aortic root, measuring 38 mm. Pulmonary Artery: The pulmonary artery is not well seen. Venous: The inferior vena cava was not well visualized. IAS/Shunts: The interatrial septum was not well visualized.  LEFT VENTRICLE PLAX 2D LVIDd:         5.16 cm LVIDs:         3.69 cm LV PW:         1.03 cm LV IVS:        0.95 cm LVOT diam:     2.00 cm LV SV:         58 LV SV Index:   25 LVOT Area:     3.14 cm  RIGHT VENTRICLE RV Basal diam:  4.31 cm RV S prime:     13.50 cm/s TAPSE (M-mode): 2.1 cm LEFT ATRIUM           Index       RIGHT ATRIUM           Index LA diam:      4.30 cm 1.81 cm/m  RA Area:     22.90 cm LA Vol (A4C): 65.9 ml 27.72 ml/m RA Volume:   73.60 ml  30.95 ml/m  AORTIC VALVE                   PULMONIC VALVE AV Area (Vmax):    2.43 cm    PV Vmax:        0.64 m/s AV Area (Vmean):   2.68 cm    PV Peak grad:   1.7 mmHg AV Area (  VTI):     3.60 cm    RVOT Peak grad: 3 mmHg AV Vmax:           104.40 cm/s AV Vmean:          65.950 cm/s AV VTI:            0.162 m AV Peak Grad:      4.4 mmHg AV Mean Grad:      2.5 mmHg LVOT Vmax:         80.90 cm/s LVOT Vmean:        56.300 cm/s LVOT VTI:          0.186 m LVOT/AV VTI ratio: 1.14  AORTA Ao Root diam: 3.37 cm MITRAL VALVE                TRICUSPID VALVE MV Area (PHT): 4.21 cm     TR Peak grad:   44.4 mmHg MV Decel Time: 180 msec     TR Vmax:        333.00 cm/s MV E velocity: 102.00 cm/s                             SHUNTS                             Systemic VTI:  0.19 m                             Systemic Diam: 2.00 cm Nelva Bush MD Electronically  signed by Nelva Bush MD Signature Date/Time: 07/08/2020/6:03:33 PM    Final         Scheduled Meds: . aspirin EC  81 mg Oral Daily  . atorvastatin  40 mg Oral q1800  . cyanocobalamin  1,000 mcg Intramuscular Q30 days  . finasteride  5 mg Oral Daily  . furosemide  40 mg Intravenous Q12H  . insulin aspart  0-15 Units Subcutaneous TID WC  . lisinopril  10 mg Oral Daily  . metoprolol succinate  50 mg Oral Daily  . multivitamin with minerals  1 tablet Oral Daily  . omega-3 acid ethyl esters  2 g Oral Daily  . pantoprazole  40 mg Oral Daily  . potassium chloride SA  20 mEq Oral Daily  . sodium chloride flush  3 mL Intravenous Q12H  . sucralfate  1 g Oral QID  . tamsulosin  0.4 mg Oral Daily   Continuous Infusions: . sodium chloride    . heparin 1,600 Units/hr (07/09/20 1817)     LOS: 2 days    Time spent: 31 mins     Wyvonnia Dusky, MD [facility] Pager [phone]  If 7PM-7AM, please contact night-coverage [email or web address] 07/10/2020, 7:16 AM

## 2020-07-10 NOTE — Progress Notes (Signed)
[place] for heparin Indication: chest pain/ACS and atrial fibrillation  Allergies  Allergen Reactions  . Novocain [Procaine] Other (See Comments)    Syncope  . Procaine Hcl Other (See Comments)    Passes out  . Lyrica [Pregabalin]   . Neurontin [Gabapentin]   . Amitriptyline Other (See Comments)    Sleepy  . Dicyclomine Other (See Comments)    Other reaction(s): Abdominal Pain  . Metformin Nausea Only  . Nitroglycerin Other (See Comments)    Makes Blood pressure go to low.     Patient Measurements: Height: 6\' 1"  (185.4 cm) Weight: 109 kg (240 lb 4.8 oz) IBW/kg (Calculated) : 79.9 Heparin Dosing Weight: 104 kg  Vital Signs: Temp: 97.8 F (36.6 C) (11/04 0017) Temp Source: Oral (11/04 0017) BP: 102/54 (11/04 0017) Pulse Rate: 52 (11/04 0017)  Labs: Recent Labs    07/08/20 0810 07/08/20 0810 07/08/20 1424 07/08/20 1600 07/09/20 0604 07/09/20 0700 07/09/20 1549 07/10/20 0005  HGB 12.1*   < >  --   --  11.4*  --   --  11.6*  HCT 36.5*  --   --   --  33.9*  --   --  34.3*  PLT 78*  --   --   --  79*  --   --  78*  APTT  --   --   --   --   --  64* 88* 80*  HEPARINUNFRC  --   --   --   --   --  1.58*  --  0.96*  CREATININE 0.87  --   --   --  1.09  --   --  1.10  TROPONINIHS 8  --  21* 23*  --   --   --   --    < > = values in this interval not displayed.    Estimated Creatinine Clearance: 71.6 mL/min (by C-G formula based on SCr of 1.1 mg/dL).   Medical History: Past Medical History:  Diagnosis Date  . Arthritis    degenerative back   . BPH with obstruction/lower urinary tract symptoms   . CAD (coronary artery disease) 2010   stent  . Diabetes mellitus   . ED (erectile dysfunction)   . Elevated PSA   . GERD (gastroesophageal reflux disease)   . Gross hematuria   . H/O CT scan    recent renal CT due to hematuria, cystoscopy - normal    . Hematuria   . History of hiatal hernia   . History of kidney stones   .  HLD (hyperlipidemia)   . Hypertension   . Ischemic heart disease 1991   with angioplasty  . Kidney stone   . Morbid obesity ([place])   . Myocardial infarction ([place])   . Peripheral neuropathy   . Pernicious anemia   . Pneumonia 1995   /w PE,   . Pulmonary embolism ([place])    previous  . Renal cyst   . Sleep apnea    pt. denies sleep apnea, pt. states he has had 2 studies - last one 2011  . Spinal stenosis      Assessment: 79 year old male presented with SOB and chest pain. Patient is on Eliquis PTA for afib. Cardiology following patient. Eliquis on hold for now pending need for invasive procedures. Pharmacy to start heparin drip. No baseline labs were ordered. Pt has thrombocytopenia. Continue to monitor plt.   11/3 0700 aPTT 64 HL 1.58.  11/3 1549 aPTT 88 Therapeutic x 1  11/4 0005 aPTT 80 therapeutic X 2   Goal of Therapy:  Heparin level 0.3-0.7 units/ml once aPTT and HL correlate.  aPTT 66-102 seconds Monitor platelets by anticoagulation protocol: Yes   Plan:  APTT is therapeutic. Will continue heparin infusion to 1600 units/hr. Recheck aPTT in 8 hours. Will monitor with aPTT as heparin level is falsely elevated, due to apixaban, will switch to heparin level once aPTT and heparin level correlate. CBC and heparin level with AM labs.  11/4:  APTT @ 0005 = 80.  APTT therapeutic X 2.  Will recheck aPTT on 11/5 with AM labs.  Will check HL on 11/4 with AM labs.   Melida Northington D 07/10/2020 1:49 AM

## 2020-07-10 NOTE — Progress Notes (Signed)
PT Cancellation Note  Patient Details Name: Cody Price MRN: [id number] DOB: 05/25/1941   Cancelled Treatment:    Reason Eval/Treat Not Completed:  (Per chart review, patient cardiac cath now scheduled for 07/11/20.  Will continue to hold PT evaluation and initiate post-cath as medically appropriate.)   Sharnice Bosler H. Owens Shark, PT, DPT, NCS 07/10/20, 8:59 AM [phone]

## 2020-07-10 NOTE — Progress Notes (Signed)
OT Cancellation Note  Patient Details Name: Cody Price MRN: [id number] DOB: Sep 25, 1940   Cancelled Treatment:    Reason Eval/Treat Not Completed: Medical issues which prohibited therapy. Cardiac Cath scheduled for 07/11/20. OT will hold evaluation until cardiac cath completed and pt is medically appropriate to participate in OT intervention.   Darleen Crocker, MS, OTR/L , CBIS ascom [phone]  07/10/20, 9:05 AM   07/10/2020, 9:05 AM

## 2020-07-11 ENCOUNTER — Encounter
Admission: EM | Disposition: A | Discharge status: Home / Self Care | Payer: Self-pay | Source: Home / Self Care | Attending: Internal Medicine

## 2020-07-11 DIAGNOSIS — I25118 Atherosclerotic heart disease of native coronary artery with other forms of angina pectoris: Secondary | ICD-10-CM | POA: Diagnosis not present

## 2020-07-11 DIAGNOSIS — I4811 Longstanding persistent atrial fibrillation: Secondary | ICD-10-CM | POA: Diagnosis not present

## 2020-07-11 DIAGNOSIS — E1159 Type 2 diabetes mellitus with other circulatory complications: Secondary | ICD-10-CM | POA: Diagnosis not present

## 2020-07-11 DIAGNOSIS — R0602 Shortness of breath: Secondary | ICD-10-CM

## 2020-07-11 DIAGNOSIS — D693 Immune thrombocytopenic purpura: Secondary | ICD-10-CM | POA: Diagnosis not present

## 2020-07-11 DIAGNOSIS — I5043 Acute on chronic combined systolic (congestive) and diastolic (congestive) heart failure: Secondary | ICD-10-CM

## 2020-07-11 DIAGNOSIS — I255 Ischemic cardiomyopathy: Secondary | ICD-10-CM

## 2020-07-11 HISTORY — PX: CORONARY STENT INTERVENTION: CATH118234

## 2020-07-11 HISTORY — PX: RIGHT/LEFT HEART CATH AND CORONARY ANGIOGRAPHY: CATH118266

## 2020-07-11 HISTORY — PX: CORONARY BALLOON ANGIOPLASTY: CATH118233

## 2020-07-11 LAB — BASIC METABOLIC PANEL
Anion gap: 7 (ref 5–15)
BUN: 25 mg/dL — ABNORMAL HIGH (ref 8–23)
CO2: 27 mmol/L (ref 22–32)
Calcium: 9 mg/dL (ref 8.9–10.3)
Chloride: 99 mmol/L (ref 98–111)
Creatinine, Ser: 1.18 mg/dL (ref 0.61–1.24)
GFR, Estimated: >60 mL/min (ref >60)
Glucose, Bld: 166 mg/dL — ABNORMAL HIGH (ref 70–99)
Potassium: 4.2 mmol/L (ref 3.5–5.1)
Sodium: 133 mmol/L — ABNORMAL LOW (ref 135–145)

## 2020-07-11 LAB — GLUCOSE, CAPILLARY
Glucose-Capillary: 146 mg/dL — ABNORMAL HIGH (ref 70–99)
Glucose-Capillary: 128 mg/dL — ABNORMAL HIGH (ref 70–99)
Glucose-Capillary: 131 mg/dL — ABNORMAL HIGH (ref 70–99)
Glucose-Capillary: 208 mg/dL — ABNORMAL HIGH (ref 70–99)

## 2020-07-11 LAB — CBC
HCT: 34.3 % — ABNORMAL LOW (ref 39.0–52.0)
Hemoglobin: 11.5 g/dL — ABNORMAL LOW (ref 13.0–17.0)
MCH: 29.8 pg (ref 26.0–34.0)
MCHC: 33.5 g/dL (ref 30.0–36.0)
MCV: 88.9 fL (ref 80.0–100.0)
Platelets: 66 10*3/uL — ABNORMAL LOW (ref 150–400)
RBC: 3.86 MIL/uL — ABNORMAL LOW (ref 4.22–5.81)
RDW: 15.4 % (ref 11.5–15.5)
WBC: 9.8 10*3/uL (ref 4.0–10.5)
nRBC: 0 % (ref 0.0–0.2)

## 2020-07-11 LAB — HEPARIN LEVEL (UNFRACTIONATED): Heparin Unfractionated: 0.64 IU/mL (ref 0.30–0.70)

## 2020-07-11 LAB — APTT: aPTT: 93 seconds — ABNORMAL HIGH (ref 24–36)

## 2020-07-11 LAB — POCT ACTIVATED CLOTTING TIME: Activated Clotting Time: 395 seconds

## 2020-07-11 SURGERY — RIGHT/LEFT HEART CATH AND CORONARY ANGIOGRAPHY
Anesthesia: Moderate Sedation

## 2020-07-11 MED ORDER — ASPIRIN 81 MG PO CHEW
81.0000 mg | CHEWABLE_TABLET | Freq: Every day | ORAL | Status: DC
  Administered 2020-07-12: 81 mg via ORAL
  Filled 2020-07-11: qty 1

## 2020-07-11 MED ORDER — LIDOCAINE HCL (PF) 1 % IJ SOLN
INTRAMUSCULAR | Status: AC
  Filled 2020-07-11: qty 30

## 2020-07-11 MED ORDER — IOHEXOL 300 MG/ML  SOLN
INTRAMUSCULAR | Status: DC | PRN
  Administered 2020-07-11: 60 mL

## 2020-07-11 MED ORDER — ACETAMINOPHEN 325 MG PO TABS: 650.0000 mg | ORAL_TABLET | ORAL | Status: DC | PRN

## 2020-07-11 MED ORDER — ALUM & MAG HYDROXIDE-SIMETH 200-200-20 MG/5ML PO SUSP
30.0000 mL | ORAL | Status: DC | PRN
  Administered 2020-07-11: 30 mL via ORAL
  Filled 2020-07-11: qty 30

## 2020-07-11 MED ORDER — HYDRALAZINE HCL 20 MG/ML IJ SOLN: 10.0000 mg | INTRAMUSCULAR | Status: AC | PRN

## 2020-07-11 MED ORDER — CLOPIDOGREL BISULFATE 75 MG PO TABS
75.0000 mg | ORAL_TABLET | Freq: Every day | ORAL | Status: DC
  Administered 2020-07-12: 75 mg via ORAL
  Filled 2020-07-11: qty 1

## 2020-07-11 MED ORDER — LABETALOL HCL 5 MG/ML IV SOLN: 10.0000 mg | INTRAVENOUS | Status: AC | PRN

## 2020-07-11 MED ORDER — HEPARIN (PORCINE) IN NACL 2000-0.9 UNIT/L-% IV SOLN
INTRAVENOUS | Status: DC | PRN
  Administered 2020-07-11: 1000 mL

## 2020-07-11 MED ORDER — MIDAZOLAM HCL 2 MG/2ML IJ SOLN
INTRAMUSCULAR | Status: AC
  Filled 2020-07-11: qty 2

## 2020-07-11 MED ORDER — ASPIRIN 81 MG PO CHEW
CHEWABLE_TABLET | ORAL | Status: DC | PRN
  Administered 2020-07-11: 324 mg via ORAL

## 2020-07-11 MED ORDER — SODIUM CHLORIDE 0.9 % IV SOLN: 250.0000 mL | INTRAVENOUS | Status: DC | PRN

## 2020-07-11 MED ORDER — SODIUM CHLORIDE 0.9 % IV SOLN: INTRAVENOUS | Status: DC

## 2020-07-11 MED ORDER — ONDANSETRON HCL 4 MG/2ML IJ SOLN
4.0000 mg | Freq: Four times a day (QID) | INTRAMUSCULAR | Status: DC | PRN
  Administered 2020-07-11: 4 mg via INTRAVENOUS
  Filled 2020-07-11: qty 2

## 2020-07-11 MED ORDER — BIVALIRUDIN BOLUS VIA INFUSION - CUPID
INTRAVENOUS | Status: DC | PRN
  Administered 2020-07-11: 83.7 mg via INTRAVENOUS

## 2020-07-11 MED ORDER — SODIUM CHLORIDE 0.9% FLUSH
3.0000 mL | Freq: Two times a day (BID) | INTRAVENOUS | Status: DC
  Administered 2020-07-12: 3 mL via INTRAVENOUS

## 2020-07-11 MED ORDER — ASPIRIN 81 MG PO CHEW: 81.0000 mg | CHEWABLE_TABLET | ORAL | Status: DC

## 2020-07-11 MED ORDER — MIDAZOLAM HCL 2 MG/2ML IJ SOLN
INTRAMUSCULAR | Status: DC | PRN
  Administered 2020-07-11: 1 mg via INTRAVENOUS

## 2020-07-11 MED ORDER — ASPIRIN 81 MG PO CHEW
CHEWABLE_TABLET | ORAL | Status: AC
  Filled 2020-07-11: qty 4

## 2020-07-11 MED ORDER — CLOPIDOGREL BISULFATE 75 MG PO TABS
ORAL_TABLET | ORAL | Status: AC
  Filled 2020-07-11: qty 8

## 2020-07-11 MED ORDER — SODIUM CHLORIDE 0.9% FLUSH: 3.0000 mL | INTRAVENOUS | Status: DC | PRN

## 2020-07-11 MED ORDER — SODIUM CHLORIDE 0.9 % IV SOLN
INTRAVENOUS | Status: AC | PRN
  Administered 2020-07-11: 1.75 mg/kg/h via INTRAVENOUS
  Administered 2020-07-11: 0.25 mg/kg/h via INTRAVENOUS

## 2020-07-11 MED ORDER — IOHEXOL 300 MG/ML  SOLN
INTRAMUSCULAR | Status: DC | PRN
  Administered 2020-07-11: 135 mL via INTRA_ARTERIAL

## 2020-07-11 MED ORDER — BIVALIRUDIN TRIFLUOROACETATE 250 MG IV SOLR
INTRAVENOUS | Status: AC
  Filled 2020-07-11: qty 250

## 2020-07-11 MED ORDER — OXYCODONE-ACETAMINOPHEN 5-325 MG PO TABS
ORAL_TABLET | ORAL | Status: AC
  Filled 2020-07-11: qty 1

## 2020-07-11 MED ORDER — SODIUM CHLORIDE 0.9 % WEIGHT BASED INFUSION
1.0000 mL/kg/h | INTRAVENOUS | Status: AC
  Administered 2020-07-11: 1 mL/kg/h via INTRAVENOUS

## 2020-07-11 MED ORDER — CLOPIDOGREL BISULFATE 75 MG PO TABS
ORAL_TABLET | ORAL | Status: DC | PRN
  Administered 2020-07-11: 600 mg via ORAL

## 2020-07-11 MED ORDER — HEPARIN (PORCINE) IN NACL 1000-0.9 UT/500ML-% IV SOLN
INTRAVENOUS | Status: AC
  Filled 2020-07-11: qty 1000

## 2020-07-11 MED ORDER — FENTANYL CITRATE (PF) 100 MCG/2ML IJ SOLN
INTRAMUSCULAR | Status: DC | PRN
Start: 2020-07-11 — End: 2020-07-11
  Administered 2020-07-11: 25 ug via INTRAVENOUS

## 2020-07-11 MED ORDER — FENTANYL CITRATE (PF) 100 MCG/2ML IJ SOLN
INTRAMUSCULAR | Status: DC | PRN
  Administered 2020-07-11 (×2): 25 ug via INTRAVENOUS

## 2020-07-11 MED ORDER — FENTANYL CITRATE (PF) 100 MCG/2ML IJ SOLN
INTRAMUSCULAR | Status: AC
  Filled 2020-07-11: qty 2

## 2020-07-11 SURGICAL SUPPLY — 18 items
BALLN TREK RX 2.25X12 (BALLOONS) ×3
BALLOON TREK RX 2.25X12 (BALLOONS) ×1 IMPLANT
CATH INFINITI 5FR JL4 (CATHETERS) ×3 IMPLANT
CATH INFINITI JR4 5F (CATHETERS) ×3 IMPLANT
CATH SWAN GANZ 7F STRAIGHT (CATHETERS) ×3 IMPLANT
CATH VISTA GUIDE 6FR XB3.5 SH (CATHETERS) ×3 IMPLANT
DEVICE CLOSURE MYNXGRIP 6/7F (Vascular Products) ×6 IMPLANT
KIT ENCORE 26 ADVANTAGE (KITS) ×3 IMPLANT
KIT LHK - MID 3VCOMP MANIF (MISCELLANEOUS) ×3 IMPLANT
NEEDLE PERC 18GX7CM (NEEDLE) ×3 IMPLANT
PACK CARDIAC CATH (CUSTOM PROCEDURE TRAY) ×3 IMPLANT
SHEATH AVANTI 5FR X 11CM (SHEATH) ×3 IMPLANT
SHEATH AVANTI 6FR X 11CM (SHEATH) ×3 IMPLANT
SHEATH AVANTI 7FRX11 (SHEATH) ×3 IMPLANT
STENT RESOLUTE ONYX 2.25X15 (Permanent Stent) ×3 IMPLANT
TUBING CIL FLEX 10 FLL-RA (TUBING) ×3 IMPLANT
WIRE ASAHI PROWATER 180CM (WIRE) ×3 IMPLANT
WIRE GUIDERIGHT .035X150 (WIRE) ×3 IMPLANT

## 2020-07-11 NOTE — Progress Notes (Signed)
PROGRESS NOTE    Cody Price  WYO:[id number] DOB: 09/21/1940 DOA: 07/08/2020 PCP: Baxter Hire, MD    Assessment & Plan:   Principal Problem:   Acute on chronic diastolic CHF (congestive heart failure) ([place]) Active Problems:   ATRIAL FIBRILLATION   Diabetes mellitus ([place])   Chronic idiopathic thrombocytopenia (H[facility])   Hypertension   Chest pain    Acute on chronic combined CHF exacerbation: c/o shortness of breath & chest pain today. Repeat echo showed EF 77-41%, diastolic parameters are indeterminate. Continue on IV lasix. Monitor I/Os. Continue on lisinopril, metoprolol as per cardio. Repeat echo shows EF 28-78%, diastolic parameters are indeterminate, mod-severe mitral regurgitation, & mild LV hypertrophy. Cardiac cath today.  Chest pain: w/ hx of CAD. Again with chest pain today. Not on aspirin secondary to chronic thrombocytopenia. Continue on statin. Continue to hold eliquis while on IV heparin drip. Troponins are minimally elevated  Permanent a. fib: continue on metoprolol. Continue to hold eliquis while on IV heparin. Continue on tele   Chronic idiopathic thrombocytopenia: labile. No need for a transfusion at this time  DM2: continue on SSI w/ accuchecks. Carb modified diet   BPH: continue on finasteride & flomax    GERD: continue on PPI     DVT prophylaxis: heparin drip  Code Status: DNR Family Communication:  Disposition Plan: likely d/c back home, depends on PT/OT recs   Status is: Inpatient  Remains inpatient appropriate because:Ongoing diagnostic testing needed not appropriate for outpatient work up, Unsafe d/c plan and IV treatments appropriate due to intensity of illness or inability to take PO, cardiac cath today    Dispo: The patient is from: Home              Anticipated d/c is to: Home              Anticipated d/c date is: 3 days              Patient currently is not medically stable to d/c.     Consultants:    cardio   Procedures:   Antimicrobials:   Subjective: Pt c/o shortness of breath & chest pain.   Objective: Vitals:   07/10/20 1533 07/10/20 1902 07/11/20 0241 07/11/20 0445  BP: 112/71 (!) 105/52 (!) 114/51 (!) 111/56  Pulse: (!) 58 61 75 76  Resp: 16 16 18 20   Temp: 97.8 F (36.6 C) 98.4 F (36.9 C) 98.3 F (36.8 C) 97.8 F (36.6 C)  TempSrc: Oral  Oral Oral  SpO2: 98% 96% 97% 98%  Weight:      Height:        Intake/Output Summary (Last 24 hours) at 07/11/2020 0723 Last data filed at 07/10/2020 2154 Gross per 24 hour  Intake 824.85 ml  Output 500 ml  Net 324.85 ml   Filed Weights   07/08/20 0804 07/09/20 0417 07/10/20 0406  Weight: 114.8 kg 109 kg 111.6 kg    Examination:  General exam: Appears calm but uncomfortable  Respiratory system: diminished breath sounds b/l. No wheezes Cardiovascular system: S1/S2+. No rubs or gallops Gastrointestinal system: Abdomen is nondistended, soft and nontender. Hypoactive bowel sounds heard. Central nervous system: Alert and oriented. Moves all 4 extremities  Psychiatry: Judgement and insight appear normal. Flat mood and affect     Data Reviewed: I have personally reviewed following labs and imaging studies  CBC: Recent Labs  Lab 07/08/20 0810 07/09/20 0604 07/10/20 0005 07/11/20 0432  WBC 6.3 7.2 7.1 9.8  NEUTROABS 3.4  --   --   --  HGB 12.1* 11.4* 11.6* 11.5*  HCT 36.5* 33.9* 34.3* 34.3*  MCV 90.6 90.2 90.3 88.9  PLT 78* 79* 78* 66*   Basic Metabolic Panel: Recent Labs  Lab 07/08/20 0810 07/08/20 1424 07/09/20 0604 07/10/20 0005 07/11/20 0432  NA 139  --  139 138 133*  K 3.5  --  3.5 3.8 4.2  CL 104  --  103 102 99  CO2 25  --  28 27 27   GLUCOSE 192*  --  175* 186* 166*  BUN 13  --  19 22 25*  CREATININE 0.87  --  1.09 1.10 1.18  CALCIUM 8.6*  --  8.5* 8.5* 9.0  MG  --  1.6*  --   --   --    GFR: Estimated Creatinine Clearance: 67.6 mL/min (by C-G formula based on SCr of 1.18 mg/dL). Liver  Function Tests: Recent Labs  Lab 07/08/20 0810  AST 15  ALT 16  ALKPHOS 51  BILITOT 1.9*  PROT 6.7  ALBUMIN 4.0   No results for input(s): LIPASE, AMYLASE in the last 168 hours. No results for input(s): AMMONIA in the last 168 hours. Coagulation Profile: No results for input(s): INR, PROTIME in the last 168 hours. Cardiac Enzymes: No results for input(s): CKTOTAL, CKMB, CKMBINDEX, TROPONINI in the last 168 hours. BNP (last 3 results) No results for input(s): PROBNP in the last 8760 hours. HbA1C: Recent Labs    07/08/20 1424  HGBA1C 6.5*   CBG: Recent Labs  Lab 07/09/20 1937 07/10/20 0809 07/10/20 1138 07/10/20 1623 07/10/20 2054  GLUCAP 172* 166* 190* 99 135*   Lipid Profile: No results for input(s): CHOL, HDL, LDLCALC, TRIG, CHOLHDL, LDLDIRECT in the last 72 hours. Thyroid Function Tests: Recent Labs    07/08/20 1424  TSH 1.197   Anemia Panel: No results for input(s): VITAMINB12, FOLATE, FERRITIN, TIBC, IRON, RETICCTPCT in the last 72 hours. Sepsis Labs: No results for input(s): PROCALCITON, LATICACIDVEN in the last 168 hours.  Recent Results (from the past 240 hour(s))  Respiratory Panel by RT PCR (Flu A&B, Covid) - Nasopharyngeal Swab     Status: None   Collection Time: 07/08/20  8:10 AM   Specimen: Nasopharyngeal Swab  Result Value Ref Range Status   SARS Coronavirus 2 by RT PCR NEGATIVE NEGATIVE Final    Comment: (NOTE) SARS-CoV-2 target nucleic acids are NOT DETECTED.  The SARS-CoV-2 RNA is generally detectable in upper respiratoy specimens during the acute phase of infection. The lowest concentration of SARS-CoV-2 viral copies this assay can detect is 131 copies/mL. A negative result does not preclude SARS-Cov-2 infection and should not be used as the sole basis for treatment or other patient management decisions. A negative result may occur with  improper specimen collection/handling, submission of specimen other than nasopharyngeal swab,  presence of viral mutation(s) within the areas targeted by this assay, and inadequate number of viral copies (<131 copies/mL). A negative result must be combined with clinical observations, patient history, and epidemiological information. The expected result is Negative.  Fact Sheet for Patients:  PinkCheek.be  Fact Sheet for Healthcare Providers:  GravelBags.it  This test is no t yet approved or cleared by the [place] FDA and  has been authorized for detection and/or diagnosis of SARS-CoV-2 by FDA under an Emergency Use Authorization (EUA). This EUA will remain  in effect (meaning this test can be used) for the duration of the COVID-19 declaration under Section 564(b)(1) of the Act, 21 U.S.C. section 360bbb-3(b)(1), unless the authorization is  terminated or revoked sooner.     Influenza A by PCR NEGATIVE NEGATIVE Final   Influenza B by PCR NEGATIVE NEGATIVE Final    Comment: (NOTE) The Xpert Xpress SARS-CoV-2/FLU/RSV assay is intended as an aid in  the diagnosis of influenza from Nasopharyngeal swab specimens and  should not be used as a sole basis for treatment. Nasal washings and  aspirates are unacceptable for Xpert Xpress SARS-CoV-2/FLU/RSV  testing.  Fact Sheet for Patients: PinkCheek.be  Fact Sheet for Healthcare Providers: GravelBags.it  This test is not yet approved or cleared by the [place] FDA and  has been authorized for detection and/or diagnosis of SARS-CoV-2 by  FDA under an Emergency Use Authorization (EUA). This EUA will remain  in effect (meaning this test can be used) for the duration of the  Covid-19 declaration under Section 564(b)(1) of the Act, 21  U.S.C. section 360bbb-3(b)(1), unless the authorization is  terminated or revoked. Performed at [facility], [address], [place], [place] [address]           Radiology Studies: No results found.      Scheduled Meds:  aspirin EC  81 mg Oral Daily   atorvastatin  40 mg Oral q1800   cyanocobalamin  1,000 mcg Intramuscular Q30 days   finasteride  5 mg Oral Daily   insulin aspart  0-15 Units Subcutaneous TID WC   lisinopril  10 mg Oral Daily   metoprolol succinate  50 mg Oral Daily   multivitamin with minerals  1 tablet Oral Daily   omega-3 acid ethyl esters  2 g Oral Daily   pantoprazole  40 mg Oral Daily   potassium chloride SA  20 mEq Oral Daily   sodium chloride flush  3 mL Intravenous Q12H   sucralfate  1 g Oral QID   tamsulosin  0.4 mg Oral Daily   Continuous Infusions:  sodium chloride     heparin 1,600 Units/hr (07/11/20 0006)     LOS: 3 days    Time spent: 33 mins     Wyvonnia Dusky, MD [facility] Pager [phone]  If 7PM-7AM, please contact night-coverage [email or web address] 07/11/2020, 7:23 AM

## 2020-07-11 NOTE — Progress Notes (Signed)
PT Cancellation Note  Patient Details Name: Cody Price MRN: [id number] DOB: 18-Feb-1941   Cancelled Treatment:    Reason Eval/Treat Not Completed: Patient at procedure or test/unavailable (Patient has been off unit for cardiac cath majority of the day; unavailable for evaluation.  Will re-attempt post-cath next date as medically appropriate.)   Deundra Furber H. Owens Shark, PT, DPT, NCS 07/11/20, 3:39 PM [phone]

## 2020-07-11 NOTE — Care Management Important Message (Signed)
Important Message  Patient Details  Name: Cody Price MRN: [id number] Date of Birth: 05/12/1941   Medicare Important Message Given:  Yes     Juliann Pulse A Lauramae Kneisley 07/11/2020, 10:44 AM

## 2020-07-11 NOTE — Progress Notes (Signed)
[place] for heparin Indication: chest pain/ACS and atrial fibrillation  Allergies  Allergen Reactions  . Novocain [Procaine] Other (See Comments)    Syncope  . Procaine Hcl Other (See Comments)    Passes out  . Lyrica [Pregabalin]   . Neurontin [Gabapentin]   . Amitriptyline Other (See Comments)    Sleepy  . Dicyclomine Other (See Comments)    Other reaction(s): Abdominal Pain  . Metformin Nausea Only  . Nitroglycerin Other (See Comments)    Makes Blood pressure go to low.     Patient Measurements: Height: 6\' 1"  (185.4 cm) Weight: 111.6 kg (246 lb) IBW/kg (Calculated) : 79.9 Heparin Dosing Weight: 104 kg  Vital Signs: Temp: 97.8 F (36.6 C) (11/05 0445) Temp Source: Oral (11/05 0445) BP: 111/56 (11/05 0445) Pulse Rate: 76 (11/05 0445)  Labs: Recent Labs    07/08/20 0810 07/08/20 0810 07/08/20 1424 07/08/20 1600 07/09/20 0604 07/09/20 0700 07/09/20 1549 07/10/20 0005 07/10/20 0645 07/11/20 0432  HGB 12.1*   < >  --   --  11.4*   < >  --  11.6*  --  11.5*  HCT 36.5*   < >  --   --  33.9*  --   --  34.3*  --  34.3*  PLT 78*   < >  --   --  79*  --   --  78*  --  66*  APTT  --   --   --   --   --    < > 88* 80*  --  93*  HEPARINUNFRC  --   --   --   --   --    < >  --  0.96* 1.01* 0.64  CREATININE 0.87   < >  --   --  1.09  --   --  1.10  --  1.18  TROPONINIHS 8  --  21* 23*  --   --   --   --   --   --    < > = values in this interval not displayed.    Estimated Creatinine Clearance: 67.6 mL/min (by C-G formula based on SCr of 1.18 mg/dL).   Medical History: Past Medical History:  Diagnosis Date  . Arthritis    degenerative back   . BPH with obstruction/lower urinary tract symptoms   . CAD (coronary artery disease) 2010   stent  . Diabetes mellitus   . ED (erectile dysfunction)   . Elevated PSA   . GERD (gastroesophageal reflux disease)   . Gross hematuria   . H/O CT scan    recent renal CT due to  hematuria, cystoscopy - normal    . Hematuria   . History of hiatal hernia   . History of kidney stones   . HLD (hyperlipidemia)   . Hypertension   . Ischemic heart disease 1991   with angioplasty  . Kidney stone   . Morbid obesity ([place])   . Myocardial infarction ([place])   . Peripheral neuropathy   . Pernicious anemia   . Pneumonia 1995   /w PE,   . Pulmonary embolism ([place])    previous  . Renal cyst   . Sleep apnea    pt. denies sleep apnea, pt. states he has had 2 studies - last one 2011  . Spinal stenosis      Assessment: 79 year old male presented with SOB and chest pain. Patient is on Eliquis PTA  for afib. Cardiology following patient. Eliquis on hold for now pending need for invasive procedures. Pharmacy to start heparin drip. No baseline labs were ordered. Pt has thrombocytopenia. Continue to monitor plt.   11/3 0700 aPTT 64 HL 1.58. 11/3 1549 aPTT 88 Therapeutic x 1  11/4 0005 aPTT 80 therapeutic X 2   Goal of Therapy:  Heparin level 0.3-0.7 units/ml once aPTT and HL correlate.  aPTT 66-102 seconds Monitor platelets by anticoagulation protocol: Yes   Plan:  APTT is therapeutic. Will continue heparin infusion to 1600 units/hr. Recheck aPTT in 8 hours. Will monitor with aPTT as heparin level is falsely elevated, due to apixaban, will switch to heparin level once aPTT and heparin level correlate. CBC and heparin level with AM labs.  11/4:  APTT @ 0005 = 80.  APTT therapeutic X 2.  Will recheck aPTT on 11/5 with AM labs.  Will check HL on 11/4 with AM labs.   11/5 @ 0432:  APTT = 0.64,  HL = 0.64 Now that aPTT and HL are therapeutic, will use HL to guide dosing. Will recheck HL on 11/6 with AM labs.   Tanajah Boulter D 07/11/2020 6:05 AM

## 2020-07-11 NOTE — Progress Notes (Signed)
Progress Note  Patient Name: Cody Price Date of Encounter: 07/11/2020 [facility] Cardiologist: Ida Rogue, MD   Subjective   Cardiac catheterization today for shortness of breath angina Stent placed to the mid LAD, distal to prior LAD stents Uneventful   Inpatient Medications    Scheduled Meds: . aspirin  81 mg Oral Pre-Cath  . aspirin  81 mg Oral Daily  . [MAR Hold] aspirin EC  81 mg Oral Daily  . [MAR Hold] atorvastatin  40 mg Oral q1800  . [START ON 07/12/2020] clopidogrel  75 mg Oral Q breakfast  . [MAR Hold] cyanocobalamin  1,000 mcg Intramuscular Q30 days  . [MAR Hold] finasteride  5 mg Oral Daily  . [MAR Hold] insulin aspart  0-15 Units Subcutaneous TID WC  . [MAR Hold] lisinopril  10 mg Oral Daily  . [MAR Hold] metoprolol succinate  50 mg Oral Daily  . [MAR Hold] multivitamin with minerals  1 tablet Oral Daily  . [MAR Hold] omega-3 acid ethyl esters  2 g Oral Daily  . oxyCODONE-acetaminophen      . [MAR Hold] pantoprazole  40 mg Oral Daily  . [MAR Hold] potassium chloride SA  20 mEq Oral Daily  . [MAR Hold] sodium chloride flush  3 mL Intravenous Q12H  . [MAR Hold] sodium chloride flush  3 mL Intravenous Q12H  . sodium chloride flush  3 mL Intravenous Q12H  . [MAR Hold] sucralfate  1 g Oral QID  . [MAR Hold] tamsulosin  0.4 mg Oral Daily   Continuous Infusions: . [MAR Hold] sodium chloride    . sodium chloride    . [START ON 07/12/2020] sodium chloride    . sodium chloride    . sodium chloride    . heparin Stopped (07/11/20 1102)   PRN Meds: [MAR Hold] sodium chloride, sodium chloride, sodium chloride, [MAR Hold] acetaminophen, acetaminophen, [MAR Hold] alum & mag hydroxide-simeth, [MAR Hold] diazepam, hydrALAZINE, labetalol, [MAR Hold] ondansetron (ZOFRAN) IV, ondansetron (ZOFRAN) IV, [MAR Hold] oxyCODONE-acetaminophen **AND** [MAR Hold] oxyCODONE, [MAR Hold] sodium chloride flush, sodium chloride flush, sodium chloride flush   Vital Signs      Vitals:   07/11/20 1345 07/11/20 1400 07/11/20 1430 07/11/20 1500  BP: 122/61 (!) 121/58 110/63 121/65  Pulse: (!) 46 73 67 64  Resp: 16 14 18 12   Temp:      TempSrc:      SpO2: 98% 99% 98% 99%  Weight:      Height:        Intake/Output Summary (Last 24 hours) at 07/11/2020 1516 Last data filed at 07/11/2020 0936 Gross per 24 hour  Intake 827.85 ml  Output 100 ml  Net 727.85 ml   Last 3 Weights 07/10/2020 07/09/2020 07/08/2020  Weight (lbs) 246 lb 240 lb 4.8 oz 253 lb  Weight (kg) 111.585 kg 108.999 kg 114.76 kg      Telemetry    Atrial fibrillation- Personally Reviewed  ECG     - Personally Reviewed  Physical Exam   GEN: No acute distress.   Neck: No JVD Cardiac:  Irregularly irregular, no murmurs, rubs, or gallops.  Respiratory: Clear to auscultation bilaterally. GI: Soft, nontender, non-distended  MS: No edema; No deformity. Neuro:  Nonfocal  Psych: Normal affect   Labs    High Sensitivity Troponin:   Recent Labs  Lab 07/08/20 0810 07/08/20 1424 07/08/20 1600  TROPONINIHS 8 21* 23*      Chemistry Recent Labs  Lab 07/08/20 0810 07/08/20 0810 07/09/20  3235 07/10/20 0005 07/11/20 0432  NA 139   < > 139 138 133*  K 3.5   < > 3.5 3.8 4.2  CL 104   < > 103 102 99  CO2 25   < > 28 27 27   GLUCOSE 192*   < > 175* 186* 166*  BUN 13   < > 19 22 25*  CREATININE 0.87   < > 1.09 1.10 1.18  CALCIUM 8.6*   < > 8.5* 8.5* 9.0  PROT 6.7  --   --   --   --   ALBUMIN 4.0  --   --   --   --   AST 15  --   --   --   --   ALT 16  --   --   --   --   ALKPHOS 51  --   --   --   --   BILITOT 1.9*  --   --   --   --   GFRNONAA >60   < > >60 >60 >60  ANIONGAP 10   < > 8 9 7    < > = values in this interval not displayed.     Hematology Recent Labs  Lab 07/09/20 0604 07/10/20 0005 07/11/20 0432  WBC 7.2 7.1 9.8  RBC 3.76* 3.80* 3.86*  HGB 11.4* 11.6* 11.5*  HCT 33.9* 34.3* 34.3*  MCV 90.2 90.3 88.9  MCH 30.3 30.5 29.8  MCHC 33.6 33.8 33.5  RDW 15.1 15.2  15.4  PLT 79* 78* 66*    BNP Recent Labs  Lab 07/08/20 0810  BNP 336.2*     DDimer No results for input(s): DDIMER in the last 168 hours.   Radiology    CARDIAC CATHETERIZATION  Result Date: 07/11/2020  Ost LM lesion is 50% stenosed.  Ost 1st Diag lesion is 90% stenosed.  Dist LAD lesion is 80% stenosed.  Mid LAD lesion is 90% stenosed.  Prox LAD-1 lesion is 10% stenosed.  Previously placed Prox LAD-2 drug eluting stent is widely patent.  Ost 3rd Mrg to 3rd Mrg lesion is 40% stenosed.  Prox Cx lesion is 50% stenosed.  Prox RCA lesion is 90% stenosed.  Dist RCA lesion is 100% stenosed.    CARDIAC CATHETERIZATION  Result Date: 07/11/2020  Previously placed Prox LAD to Mid LAD stent (unknown type) is widely patent.  Mid LAD lesion is 80% stenosed.  A drug-eluting stent was successfully placed using a [place] [id number].  Post intervention, there is a 0% residual stenosis.  1.  High-grade stenosis mid LAD 2.  Successful PCI with DES mid LAD Recommendations 1.  Dual antiplatelet therapy    Cardiac Studies   Echocardiogram with ejection fraction 45 to 50%  Cardiac catheterization today with 90% mid LAD disease, stent placed  Patient Profile     79 y.o. male with a hx of CAD s/p PCI (1991 stent to RCA later occluded 100%, 9/2010DES toLAD, 2015 LHCwith restart of Plavix, 06/2018 LHC with PCI to mLAD and medical tx of RCA and small diagonal branch), asx PAF on Eliquis, HFpEF (EF 55-60%), moderate MR, pulmonary HTN, HTN, HLD with history of statin intolerance, DM2 with lower extremity neuropathy, spinal stenosis s/p surgery, GERD, and prior smoker who is being seen today for the evaluation of acute on chronic HFrEF and CP.  Assessment & Plan    Coronary disease with stable angina Known stent to the RCA and LAD Catheterization today, new severe lesion  mid LAD beyond prior stent  --Stent placed, good result --On aspirin Plavix (low today) -He reports having  stomach irritation with aspirin, takes Carafate -Recommend he stay on aspirin Plavix for now at least 1 month -After 1 month hold aspirin restart Eliquis -We will try to avoid triple therapy We will continue Lasix 40 twice daily, metoprolol, lisinopril, Lipitor 40  Cardiomyopathy Ejection fraction 45 to 50%, -Continue Lasix to restart November 6 after renal function evaluated -Lisinopril, metoprolol  Thrombocytopenia Chronic issue,  Managed by Dr. Grayland Ormond, periodically treated with prednisone  Atrial fibrillation, persistent Eliquis on hold 2 days -We will hold for now to avoid triple therapy -Aspirin Plavix given new stent to the LAD   Total encounter time more than 25 minutes  Greater than 50% was spent in counseling and coordination of care with the patient     For questions or updates, please contact [facility] Please consult [email or web address] for contact info under        Signed, Ida Rogue, MD  07/11/2020, 3:16 PM

## 2020-07-11 NOTE — Progress Notes (Signed)
OT Cancellation Note  Patient Details Name: GREOGORY CORNETTE MRN: [id number] DOB: 03/08/1941   Cancelled Treatment:    Reason Eval/Treat Not Completed: Patient at procedure or test/ unavailable. Pt currently off the unit for Cardiac Cath. OT will hold evaluation until cardiac cath completed and pt is medically appropriate to participate in OT intervention.   Dessie Coma, M.S. OTR/L  07/11/20, 2:35 PM  ascom [phone]

## 2020-07-12 DIAGNOSIS — I5033 Acute on chronic diastolic (congestive) heart failure: Secondary | ICD-10-CM | POA: Diagnosis not present

## 2020-07-12 DIAGNOSIS — I2510 Atherosclerotic heart disease of native coronary artery without angina pectoris: Secondary | ICD-10-CM

## 2020-07-12 DIAGNOSIS — I4821 Permanent atrial fibrillation: Secondary | ICD-10-CM | POA: Diagnosis not present

## 2020-07-12 DIAGNOSIS — I10 Essential (primary) hypertension: Secondary | ICD-10-CM

## 2020-07-12 LAB — CBC
HCT: 32.3 % — ABNORMAL LOW (ref 39.0–52.0)
Hemoglobin: 10.9 g/dL — ABNORMAL LOW (ref 13.0–17.0)
MCH: 30.3 pg (ref 26.0–34.0)
MCHC: 33.7 g/dL (ref 30.0–36.0)
MCV: 89.7 fL (ref 80.0–100.0)
Platelets: 60 10*3/uL — ABNORMAL LOW (ref 150–400)
RBC: 3.6 MIL/uL — ABNORMAL LOW (ref 4.22–5.81)
RDW: 15.2 % (ref 11.5–15.5)
WBC: 7 10*3/uL (ref 4.0–10.5)
nRBC: 0 % (ref 0.0–0.2)

## 2020-07-12 LAB — HEPARIN LEVEL (UNFRACTIONATED): Heparin Unfractionated: 0.28 IU/mL — ABNORMAL LOW (ref 0.30–0.70)

## 2020-07-12 LAB — BASIC METABOLIC PANEL
Anion gap: 7 (ref 5–15)
BUN: 19 mg/dL (ref 8–23)
CO2: 28 mmol/L (ref 22–32)
Calcium: 8.8 mg/dL — ABNORMAL LOW (ref 8.9–10.3)
Chloride: 104 mmol/L (ref 98–111)
Creatinine, Ser: 0.93 mg/dL (ref 0.61–1.24)
GFR, Estimated: >60 mL/min (ref >60)
Glucose, Bld: 174 mg/dL — ABNORMAL HIGH (ref 70–99)
Potassium: 5 mmol/L (ref 3.5–5.1)
Sodium: 139 mmol/L (ref 135–145)

## 2020-07-12 LAB — GLUCOSE, CAPILLARY: Glucose-Capillary: 165 mg/dL — ABNORMAL HIGH (ref 70–99)

## 2020-07-12 MED ORDER — METOPROLOL SUCCINATE ER 50 MG PO TB24: 50.0000 mg | ORAL_TABLET | Freq: Every day | ORAL | 0 refills | Status: DC

## 2020-07-12 MED ORDER — ASPIRIN 81 MG PO TBEC: 81.0000 mg | DELAYED_RELEASE_TABLET | Freq: Every day | ORAL | 0 refills | Status: AC

## 2020-07-12 MED ORDER — DILTIAZEM HCL ER COATED BEADS 180 MG PO CP24
180.0000 mg | ORAL_CAPSULE | Freq: Every day | ORAL | Status: DC
Start: 2020-07-12 — End: 2020-07-30

## 2020-07-12 MED ORDER — FUROSEMIDE 40 MG PO TABS: 40.0000 mg | ORAL_TABLET | Freq: Two times a day (BID) | ORAL | 0 refills | Status: DC

## 2020-07-12 MED ORDER — CLOPIDOGREL BISULFATE 75 MG PO TABS: 75.0000 mg | ORAL_TABLET | Freq: Every day | ORAL | 0 refills | Status: AC

## 2020-07-12 NOTE — Progress Notes (Signed)
Patient discharged per orders. PIV and tele removed from patient. AVS reviewed. Taken to medical mall entrance via wheelchair by volunteer services.

## 2020-07-12 NOTE — Progress Notes (Addendum)
[place] for heparin Indication: chest pain/ACS and atrial fibrillation  Allergies  Allergen Reactions  . Novocain [Procaine] Other (See Comments)    Syncope  . Procaine Hcl Other (See Comments)    Passes out  . Lyrica [Pregabalin]   . Neurontin [Gabapentin]   . Amitriptyline Other (See Comments)    Sleepy  . Dicyclomine Other (See Comments)    Other reaction(s): Abdominal Pain  . Metformin Nausea Only  . Nitroglycerin Other (See Comments)    Makes Blood pressure go to low.     Patient Measurements: Height: 6\' 1"  (185.4 cm) Weight: 110.3 kg (243 lb 3.2 oz) IBW/kg (Calculated) : 79.9 Heparin Dosing Weight: 104 kg  Vital Signs: Temp: 98.2 F (36.8 C) (11/06 0515) Temp Source: Oral (11/06 0515) BP: 131/60 (11/06 0515) Pulse Rate: 74 (11/06 0515)  Labs: Recent Labs    07/09/20 0700 07/09/20 1549 07/10/20 0005 07/10/20 0005 07/10/20 0645 07/11/20 0432 07/12/20 0333  HGB  --   --  11.6*   < >  --  11.5* 10.9*  HCT  --   --  34.3*  --   --  34.3* 32.3*  PLT  --   --  78*  --   --  66* 60*  APTT  --  88* 80*  --   --  93*  --   HEPARINUNFRC   < >  --  0.96*   < > 1.01* 0.64 0.28*  CREATININE  --   --  1.10  --   --  1.18 0.93   < > = values in this interval not displayed.    Estimated Creatinine Clearance: 85.3 mL/min (by C-G formula based on SCr of 0.93 mg/dL).   Medical History: Past Medical History:  Diagnosis Date  . Arthritis    degenerative back   . BPH with obstruction/lower urinary tract symptoms   . CAD (coronary artery disease) 2010   stent  . Diabetes mellitus   . ED (erectile dysfunction)   . Elevated PSA   . GERD (gastroesophageal reflux disease)   . Gross hematuria   . H/O CT scan    recent renal CT due to hematuria, cystoscopy - normal    . Hematuria   . History of hiatal hernia   . History of kidney stones   . HLD (hyperlipidemia)   . Hypertension   . Ischemic heart disease 1991   with  angioplasty  . Kidney stone   . Morbid obesity ([place])   . Myocardial infarction ([place])   . Peripheral neuropathy   . Pernicious anemia   . Pneumonia 1995   /w PE,   . Pulmonary embolism ([place])    previous  . Renal cyst   . Sleep apnea    pt. denies sleep apnea, pt. states he has had 2 studies - last one 2011  . Spinal stenosis      Assessment: 79 year old male presented with SOB and chest pain. Patient is on Eliquis PTA for afib. Cardiology following patient. Eliquis on hold for now pending need for invasive procedures. Pharmacy to start heparin drip. No baseline labs were ordered. Pt has thrombocytopenia. Continue to monitor plt.   11/3 0700 aPTT 64 HL 1.58. 11/3 1549 aPTT 88 Therapeutic x 1  11/4 0005 aPTT 80 therapeutic X 2   Goal of Therapy:  Heparin level 0.3-0.7 units/ml once aPTT and HL correlate.  aPTT 66-102 seconds Monitor platelets by anticoagulation protocol: Yes  Plan:  APTT is therapeutic. Will continue heparin infusion to 1600 units/hr. Recheck aPTT in 8 hours. Will monitor with aPTT as heparin level is falsely elevated, due to apixaban, will switch to heparin level once aPTT and heparin level correlate. CBC and heparin level with AM labs.  11/4:  APTT @ 0005 = 80.  APTT therapeutic X 2.  Will recheck aPTT on 11/5 with AM labs.  Will check HL on 11/4 with AM labs.   11/5 @ 0432:  APTT = 0.64,  HL = 0.64 Now that aPTT and HL are therapeutic, will use HL to guide dosing. Will recheck HL on 11/6 with AM labs.   11/6: HL @ 0333 = 0.28 Pt is post cath and RN states heparin drip is no longer running and has been off since pt returned from cath lab.  Emar shows heparin is still active and consult still active.  Messaged MD to clarify but no response as of 0647.     Robbins,Jason D 07/12/2020 6:43 AM   Addendum : Larena Sox with Cards - heparin dc'ed  Lu Duffel, PharmD, BCPS Clinical Pharmacist 07/12/2020 8:24 AM

## 2020-07-12 NOTE — Discharge Summary (Addendum)
Physician Discharge Summary  [name] HCW:[id number] DOB: November 26, 1940 DOA: 07/08/2020  PCP: Baxter Hire, MD  Admit date: 07/08/2020 Discharge date: 07/12/2020  Admitted From: home  Disposition: home   Recommendations for Outpatient Follow-up:  1. Follow up with PCP in 1-2 weeks 2. F/u cardio, Dr. Rockey Situ, in 1-2 weeks  Home Health: no  Equipment/Devices:  Discharge Condition: stable  CODE STATUS: DNR  Diet recommendation: Heart Healthy / Carb Modified  Brief/Interim Summary: HPI was taken from Dr. Francine Graven: Cody Price is a 79 y.o. male with medical history significant for diabetes mellitus, GERD, obesity, atrial fibrillation and coronary artery disease status post stent angioplasty who presents to the emergency room for evaluation of shortness of breath which he has had for about 2 weeks.  Shortness of breath was initially associated with exertion but on the day of admission he was short of breath at rest.  He has some mild lower extremity swelling as well as orthopnea.  He also complains of midsternal chest pain which he rates a 5 x 10 in intensity at its worst.  Pain is nonradiating and he denies having any associated nausea, vomiting, diaphoresis or palpitations. He was recently started on Lasix for worsening swelling without any improvement in his symptoms. He denies having any fever, no chills, no cough, no headache, no dizziness, no lightheadedness, no abdominal pain or any changes in his bowel habits. Upon arrival to the ER he had a blood pressure 150/82, he was tachypneic with respiratory rate of 24 Labs show sodium 139, potassium 3.5, chloride 104, bicarb 25, glucose 192, BUN 13, creatinine 0.7, calcium 8.6, alkaline phosphatase 51, albumin 4.0, AST 15, ALT 16, total protein 6.7 BNP 336, troponin VIII, white count 6.3, hemoglobin 12.1, hematocrit 36.5, MCV 90.6, RDW 15.1, platelet count 78 Respiratory viral panel still pending Chest x-ray reviewed by me shows cardiomegaly  with diffuse bilateral pulmonary interstitial pulmonary consistent with CHF. Twelve-lead EKG reviewed by me shows A. fib   ED Course: Patient is a 79 year old [name] who presents to the ER for evaluation of worsening shortness of breath associated with orthopnea.  Chest x-ray shows CHF and BNP is elevated.  He will be admitted to the hospital for further evaluation.  Hospital Course from Dr. Lenise Herald 11/3-11/6/21: Pt presented w/ shortness of breath and chest pain was found to have to CHF exacerbation as well as new severe lesion mid LAD that was stented during cardiac cath. Pt was treated w/ IV lasix, lisinopril, metoprolol, IV heparin drip & stent placement. Pt tolerated the procedure well. PT saw the pt and pt did not require any therapy f/u. Pt will f/u outpatient w/ Dr. Rockey Situ in 1-2 weeks. For more information please see other progress/consult notes.   Discharge Diagnoses:  Principal Problem:   Acute on chronic diastolic CHF (congestive heart failure) (HCC) Active Problems:   ATRIAL FIBRILLATION   Diabetes mellitus (HCC)   Chronic idiopathic thrombocytopenia (HCC)   Hypertension   Chest pain   Acute on chronic combined CHF exacerbation: Repeat echo showed EF 17-61%, diastolic parameters are indeterminate. Continue on lasix. Monitor I/Os. Continue on lisinopril, metoprolol, aspirin, plavix as per cardio. Repeat echo shows EF 60-73%, diastolic parameters are indeterminate, mod-severe mitral regurgitation, & mild LV hypertrophy. S/p cardiac cath w/ stent to mid LAD on 07/11/20  CAD: w/ chest pain. S/p cardiac cath w/ stent to mid LAD on 07/11/20. Continue on plavix, aspirin & statin as per cardio   Ischemic cardiomyopathy: EF 45-50%.  S/p cardiac cath w/ new severe lesion in mid LAD  Permanent a. fib: continue on metoprolol. Continue to hold eliquis while on aspirin & plavix as per cardio. Continue on tele   Chronic idiopathic thrombocytopenia: labile. No need for a  transfusion at this time  DM2: continue on SSI w/ accuchecks. Carb modified diet   BPH: continue on finasteride & flomax    GERD: continue on PPI   Discharge Instructions  Discharge Instructions    AMB Referral to Cardiac Rehabilitation - Phase II   Complete by: As directed    Diagnosis: Coronary Stents   After initial evaluation and assessments completed: Virtual Based Care may be provided alone or in conjunction with Phase 2 Cardiac Rehab based on patient barriers.: Yes   Diet - low sodium heart healthy   Complete by: As directed    Discharge instructions   Complete by: As directed    F/u PCP in 1-2 weeks. F/u w/ cardio, Dr. Rockey Situ, in 1-2 weeks   Increase activity slowly   Complete by: As directed      Allergies as of 07/12/2020      Reactions   Novocain [procaine] Other (See Comments)   Syncope   Procaine Hcl Other (See Comments)   Passes out   Lyrica [pregabalin]    Neurontin [gabapentin]    Amitriptyline Other (See Comments)   Sleepy   Dicyclomine Other (See Comments)   Other reaction(s): Abdominal Pain   Metformin Nausea Only   Nitroglycerin Other (See Comments)   Makes Blood pressure go to low.       Medication List    STOP taking these medications   Eliquis 5 MG Tabs tablet Generic drug: apixaban     TAKE these medications   acetaminophen 500 MG tablet Commonly known as: TYLENOL Take 500 mg by mouth as needed.   aspirin 81 MG EC tablet Take 1 tablet (81 mg total) by mouth daily. Swallow whole. Start taking on: July 13, 2020   atorvastatin 40 MG tablet Commonly known as: LIPITOR TAKE 1 TABLET (40 MG TOTAL) BY MOUTH DAILY AT 6 PM.   clopidogrel 75 MG tablet Commonly known as: PLAVIX Take 1 tablet (75 mg total) by mouth daily with breakfast. Start taking on: July 13, 2020   Co Q-10 200 MG Caps Take 200 mg by mouth daily.   cyanocobalamin 1000 MCG/ML injection Commonly known as: (VITAMIN B-12) Inject 1,000 mcg into the muscle every 30  (thirty) days.   diltiazem 180 MG 24 hr capsule Commonly known as: CARDIZEM CD Take 1 capsule (180 mg total) by mouth daily before breakfast. Hold off taking this medication until you see your cardiologist What changed: additional instructions   feeding supplement Liqd Take 237 mLs by mouth daily.   finasteride 5 MG tablet Commonly known as: Proscar Take 1 tablet (5 mg total) by mouth daily.   furosemide 40 MG tablet Commonly known as: LASIX Take 1 tablet (40 mg total) by mouth 2 (two) times daily. What changed: when to take this   glimepiride 4 MG tablet Commonly known as: AMARYL Take 4 mg by mouth 2 (two) times daily.   lisinopril 10 MG tablet Commonly known as: ZESTRIL TAKE ONE TABLET BY MOUTH ONCE DAILY   metoprolol succinate 50 MG 24 hr tablet Commonly known as: TOPROL-XL Take 1 tablet (50 mg total) by mouth daily. Take with or immediately following a meal. Start taking on: July 13, 2020   multivitamin with minerals Tabs tablet Take  1 tablet by mouth daily.   nystatin cream Commonly known as: MYCOSTATIN Apply 1 application topically 2 (two) times daily.   omega-3 acid ethyl esters 1 g capsule Commonly known as: LOVAZA Take 2 g by mouth daily.   omeprazole 40 MG capsule Commonly known as: PRILOSEC Take 40 mg by mouth 2 (two) times daily.   oxyCODONE-acetaminophen 10-325 MG tablet Commonly known as: PERCOCET Take 1 tablet by mouth every 4 (four) hours as needed for pain.   potassium chloride SA 20 MEQ tablet Commonly known as: KLOR-CON Take 1 tablet (20 mEq total) by mouth daily.   sucralfate 1 g tablet Commonly known as: CARAFATE Take 1 g by mouth 4 (four) times daily.   tamsulosin 0.4 MG Caps capsule Commonly known as: FLOMAX Take 1 capsule (0.4 mg total) by mouth daily.   testosterone cypionate 200 MG/ML injection Commonly known as: DEPOTESTOSTERONE CYPIONATE Inject 200 mg into the muscle every 28 (twenty-eight) days.   Valium 5 MG  tablet Generic drug: diazepam Take 5 mg by mouth 3 (three) times daily as needed for anxiety or muscle spasms.   Viagra 100 MG tablet Generic drug: sildenafil Take 100 mg by mouth daily as needed for erectile dysfunction.       Follow-up Information    [facility] up on 07/22/2020.   Specialty: Cardiology Why: at 12:30pm. Enter through the [facility] [place] entrance Contact information: [address] [place]             Allergies  Allergen Reactions  . Novocain [Procaine] Other (See Comments)    Syncope  . Procaine Hcl Other (See Comments)    Passes out  . Lyrica [Pregabalin]   . Neurontin [Gabapentin]   . Amitriptyline Other (See Comments)    Sleepy  . Dicyclomine Other (See Comments)    Other reaction(s): Abdominal Pain  . Metformin Nausea Only  . Nitroglycerin Other (See Comments)    Makes Blood pressure go to low.     Consultations:  Cardio, Dr. Rockey Situ    Procedures/Studies: CARDIAC CATHETERIZATION  Result Date: 07/11/2020  Ost LM lesion is 50% stenosed.  Ost 1st Diag lesion is 90% stenosed.  Dist LAD lesion is 80% stenosed.  Mid LAD lesion is 90% stenosed.  Prox LAD-1 lesion is 10% stenosed.  Previously placed Prox LAD-2 drug eluting stent is widely patent.  Ost 3rd Mrg to 3rd Mrg lesion is 40% stenosed.  Prox Cx lesion is 50% stenosed.  Prox RCA lesion is 90% stenosed.  Dist RCA lesion is 100% stenosed.    CARDIAC CATHETERIZATION  Result Date: 07/11/2020  Previously placed Prox LAD to Mid LAD stent (unknown type) is widely patent.  Mid LAD lesion is 80% stenosed.  A drug-eluting stent was successfully placed using a [place] [id number].  Post intervention, there is a 0% residual stenosis.  1.  High-grade stenosis mid LAD 2.  Successful PCI with DES mid LAD Recommendations 1.  Dual antiplatelet therapy   DG Chest Portable 1  View  Result Date: 07/08/2020 CLINICAL DATA:  Shortness of breath. EXAM: PORTABLE CHEST 1 VIEW COMPARISON:  06/05/2018. FINDINGS: Cardiomegaly with diffuse bilateral pulmonary interstitial prominence consistent with CHF. Pneumonitis cannot be excluded. No prominent pleural effusion. No pneumothorax. IMPRESSION: Cardiomegaly with diffuse bilateral pulmonary interstitial prominence consistent with CHF. Electronically Signed   By: Marcello Moores  Register   On: 07/08/2020 08:42   ECHOCARDIOGRAM COMPLETE  Result Date: 07/08/2020  ECHOCARDIOGRAM REPORT   Patient Name:   KIREN MCISAAC Date of Exam: 07/08/2020 Medical Rec #:  [id number]      Height:       73.0 in Accession #:    [id number]     Weight:       253.0 lb Date of Birth:  23-Sep-1940       BSA:          2.378 m Patient Age:    79 years       BP:           105/69 mmHg Patient Gender: M              HR:           88 bpm. Exam Location:  [facility] Procedure: 2D Echo, Cardiac Doppler and Color Doppler Indications:     CHF- acute diastolic 809.98  History:         Patient has prior history of Echocardiogram examinations, most                  recent 06/06/2018. Previous Myocardial Infarction; Risk                  Factors:Diabetes. Pulmonary embolus.  Sonographer:     Sherrie Sport RDCS (AE) Referring Phys:  [id number] Collier Bullock Diagnosing Phys: Nelva Bush MD  Sonographer Comments: Image acquisition challenging due to patient body habitus. IMPRESSIONS  1. Left ventricular ejection fraction, by estimation, is 45 to 50%. The left ventricle has mildly decreased function. Left ventricular endocardial border not optimally defined to evaluate regional wall motion. There is mild left ventricular hypertrophy.  Left ventricular diastolic parameters are indeterminate.  2. Right ventricular systolic function is normal. The right ventricular size is mildly enlarged. Mildly increased right ventricular wall thickness. There is moderately elevated pulmonary artery systolic pressure.  3.  Left atrial size was mildly dilated.  4. Right atrial size was mildly dilated.  5. The pericardial effusion is posterior to the left ventricle.  6. The mitral valve is degenerative. Moderate to severe mitral valve regurgitation. No evidence of mitral stenosis.  7. The aortic valve is tricuspid. There is mild calcification of the aortic valve. There is mild thickening of the aortic valve. Aortic valve regurgitation is not visualized. Mild to moderate aortic valve sclerosis/calcification is present, without any evidence of aortic stenosis.  8. Aortic dilatation noted. There is borderline dilatation of the aortic root, measuring 38 mm. FINDINGS  Left Ventricle: Left ventricular ejection fraction, by estimation, is 45 to 50%. The left ventricle has mildly decreased function. Left ventricular endocardial border not optimally defined to evaluate regional wall motion. The left ventricular internal cavity size was normal in size. There is mild left ventricular hypertrophy. Left ventricular diastolic parameters are indeterminate. Right Ventricle: The right ventricular size is mildly enlarged. Mildly increased right ventricular wall thickness. Right ventricular systolic function is normal. There is moderately elevated pulmonary artery systolic pressure. Left Atrium: Left atrial size was mildly dilated. Right Atrium: Right atrial size was mildly dilated. Pericardium: Trivial pericardial effusion is present. The pericardial effusion is posterior to the left ventricle. Mitral Valve: The mitral valve is degenerative in appearance. There is mild thickening of the mitral valve leaflet(s). Moderate to severe mitral valve regurgitation. No evidence of mitral valve stenosis. Tricuspid Valve: The tricuspid valve is not well visualized. Tricuspid valve regurgitation is trivial. Aortic Valve: The aortic valve is tricuspid. There is mild calcification of the aortic valve. There is  mild thickening of the aortic valve. There is mild aortic  valve annular calcification. Aortic valve regurgitation is not visualized. Mild to moderate aortic valve sclerosis/calcification is present, without any evidence of aortic stenosis. Aortic valve mean gradient measures 2.5 mmHg. Aortic valve peak gradient measures 4.4 mmHg. Aortic valve area, by VTI measures 3.60 cm. Pulmonic Valve: The pulmonic valve was not well visualized. Pulmonic valve regurgitation is mild. No evidence of pulmonic stenosis. Aorta: Aortic dilatation noted. There is borderline dilatation of the aortic root, measuring 38 mm. Pulmonary Artery: The pulmonary artery is not well seen. Venous: The inferior vena cava was not well visualized. IAS/Shunts: The interatrial septum was not well visualized.  LEFT VENTRICLE PLAX 2D LVIDd:         5.16 cm LVIDs:         3.69 cm LV PW:         1.03 cm LV IVS:        0.95 cm LVOT diam:     2.00 cm LV SV:         58 LV SV Index:   25 LVOT Area:     3.14 cm  RIGHT VENTRICLE RV Basal diam:  4.31 cm RV S prime:     13.50 cm/s TAPSE (M-mode): 2.1 cm LEFT ATRIUM           Index       RIGHT ATRIUM           Index LA diam:      4.30 cm 1.81 cm/m  RA Area:     22.90 cm LA Vol (A4C): 65.9 ml 27.72 ml/m RA Volume:   73.60 ml  30.95 ml/m  AORTIC VALVE                   PULMONIC VALVE AV Area (Vmax):    2.43 cm    PV Vmax:        0.64 m/s AV Area (Vmean):   2.68 cm    PV Peak grad:   1.7 mmHg AV Area (VTI):     3.60 cm    RVOT Peak grad: 3 mmHg AV Vmax:           104.40 cm/s AV Vmean:          65.950 cm/s AV VTI:            0.162 m AV Peak Grad:      4.4 mmHg AV Mean Grad:      2.5 mmHg LVOT Vmax:         80.90 cm/s LVOT Vmean:        56.300 cm/s LVOT VTI:          0.186 m LVOT/AV VTI ratio: 1.14  AORTA Ao Root diam: 3.37 cm MITRAL VALVE                TRICUSPID VALVE MV Area (PHT): 4.21 cm     TR Peak grad:   44.4 mmHg MV Decel Time: 180 msec     TR Vmax:        333.00 cm/s MV E velocity: 102.00 cm/s                             SHUNTS                              Systemic VTI:  0.19 m  Systemic Diam: 2.00 cm Nelva Bush MD Electronically signed by Nelva Bush MD Signature Date/Time: 07/08/2020/6:03:33 PM    Final        Subjective: Pt c/o fatigue    Discharge Exam: Vitals:   07/12/20 0515 07/12/20 0742  BP: 131/60 122/60  Pulse: 74 75  Resp:  18  Temp: 98.2 F (36.8 C) 97.9 F (36.6 C)  SpO2: 93% 95%   Vitals:   07/12/20 0050 07/12/20 0515 07/12/20 0544 07/12/20 0742  BP: 108/63 131/60  122/60  Pulse: 84 74  75  Resp: 18   18  Temp: 98.8 F (37.1 C) 98.2 F (36.8 C)  97.9 F (36.6 C)  TempSrc: Oral Oral  Oral  SpO2: 92% 93%  95%  Weight:   110.3 kg   Height:        General: Pt is alert, awake, not in acute distress Cardiovascular:  S1/S2 +, no rubs, no gallops Respiratory: CTA bilaterally, no wheezing, no rhonchi Abdominal: Soft, NT, ND, bowel sounds + Extremities:  no cyanosis    The results of significant diagnostics from this hospitalization (including imaging, microbiology, ancillary and laboratory) are listed below for reference.     Microbiology: Recent Results (from the past 240 hour(s))  Respiratory Panel by RT PCR (Flu A&B, Covid) - Nasopharyngeal Swab     Status: None   Collection Time: 07/08/20  8:10 AM   Specimen: Nasopharyngeal Swab  Result Value Ref Range Status   SARS Coronavirus 2 by RT PCR NEGATIVE NEGATIVE Final    Comment: (NOTE) SARS-CoV-2 target nucleic acids are NOT DETECTED.  The SARS-CoV-2 RNA is generally detectable in upper respiratoy specimens during the acute phase of infection. The lowest concentration of SARS-CoV-2 viral copies this assay can detect is 131 copies/mL. A negative result does not preclude SARS-Cov-2 infection and should not be used as the sole basis for treatment or other patient management decisions. A negative result may occur with  improper specimen collection/handling, submission of specimen other than nasopharyngeal swab, presence  of viral mutation(s) within the areas targeted by this assay, and inadequate number of viral copies (<131 copies/mL). A negative result must be combined with clinical observations, patient history, and epidemiological information. The expected result is Negative.  Fact Sheet for Patients:  PinkCheek.be  Fact Sheet for Healthcare Providers:  GravelBags.it  This test is no t yet approved or cleared by the [place] FDA and  has been authorized for detection and/or diagnosis of SARS-CoV-2 by FDA under an Emergency Use Authorization (EUA). This EUA will remain  in effect (meaning this test can be used) for the duration of the COVID-19 declaration under Section 564(b)(1) of the Act, 21 U.S.C. section 360bbb-3(b)(1), unless the authorization is terminated or revoked sooner.     Influenza A by PCR NEGATIVE NEGATIVE Final   Influenza B by PCR NEGATIVE NEGATIVE Final    Comment: (NOTE) The Xpert Xpress SARS-CoV-2/FLU/RSV assay is intended as an aid in  the diagnosis of influenza from Nasopharyngeal swab specimens and  should not be used as a sole basis for treatment. Nasal washings and  aspirates are unacceptable for Xpert Xpress SARS-CoV-2/FLU/RSV  testing.  Fact Sheet for Patients: PinkCheek.be  Fact Sheet for Healthcare Providers: GravelBags.it  This test is not yet approved or cleared by the [place] FDA and  has been authorized for detection and/or diagnosis of SARS-CoV-2 by  FDA under an Emergency Use Authorization (EUA). This EUA will remain  in effect (meaning this test can be  used) for the duration of the  Covid-19 declaration under Section 564(b)(1) of the Act, 21  U.S.C. section 360bbb-3(b)(1), unless the authorization is  terminated or revoked. Performed at [facility], [address] [place], [place] [address]      Labs: BNP (last  3 results) Recent Labs    07/08/20 0810  BNP 751.0*   Basic Metabolic Panel: Recent Labs  Lab 07/08/20 0810 07/08/20 1424 07/09/20 0604 07/10/20 0005 07/11/20 0432 07/12/20 0333  NA 139  --  139 138 133* 139  K 3.5  --  3.5 3.8 4.2 5.0  CL 104  --  103 102 99 104  CO2 25  --  28 27 27 28   GLUCOSE 192*  --  175* 186* 166* 174*  BUN 13  --  19 22 25* 19  CREATININE 0.87  --  1.09 1.10 1.18 0.93  CALCIUM 8.6*  --  8.5* 8.5* 9.0 8.8*  MG  --  1.6*  --   --   --   --    Liver Function Tests: Recent Labs  Lab 07/08/20 0810  AST 15  ALT 16  ALKPHOS 51  BILITOT 1.9*  PROT 6.7  ALBUMIN 4.0   No results for input(s): LIPASE, AMYLASE in the last 168 hours. No results for input(s): AMMONIA in the last 168 hours. CBC: Recent Labs  Lab 07/08/20 0810 07/09/20 0604 07/10/20 0005 07/11/20 0432 07/12/20 0333  WBC 6.3 7.2 7.1 9.8 7.0  NEUTROABS 3.4  --   --   --   --   HGB 12.1* 11.4* 11.6* 11.5* 10.9*  HCT 36.5* 33.9* 34.3* 34.3* 32.3*  MCV 90.6 90.2 90.3 88.9 89.7  PLT 78* 79* 78* 66* 60*   Cardiac Enzymes: No results for input(s): CKTOTAL, CKMB, CKMBINDEX, TROPONINI in the last 168 hours. BNP: Invalid input(s): POCBNP CBG: Recent Labs  Lab 07/11/20 0747 07/11/20 1108 07/11/20 1731 07/11/20 2029 07/12/20 0739  GLUCAP 146* 128* 131* 208* 165*   D-Dimer No results for input(s): DDIMER in the last 72 hours. Hgb A1c No results for input(s): HGBA1C in the last 72 hours. Lipid Profile No results for input(s): CHOL, HDL, LDLCALC, TRIG, CHOLHDL, LDLDIRECT in the last 72 hours. Thyroid function studies No results for input(s): TSH, T4TOTAL, T3FREE, THYROIDAB in the last 72 hours.  Invalid input(s): FREET3 Anemia work up No results for input(s): VITAMINB12, FOLATE, FERRITIN, TIBC, IRON, RETICCTPCT in the last 72 hours. Urinalysis    Component Value Date/Time   COLORURINE YELLOW 06/29/2019 1457   APPEARANCEUR CLEAR 06/29/2019 1457   APPEARANCEUR Clear  01/01/2016 1354   LABSPEC 1.025 06/29/2019 1457   LABSPEC 1.023 04/25/2014 0238   PHURINE 6.0 06/29/2019 1457   GLUCOSEU 100 (A) 06/29/2019 1457   GLUCOSEU 50 mg/dL 04/25/2014 0238   HGBUR NEGATIVE 06/29/2019 1457   BILIRUBINUR NEGATIVE 06/29/2019 1457   BILIRUBINUR Negative 01/01/2016 1354   BILIRUBINUR Negative 04/25/2014 0238   KETONESUR NEGATIVE 06/29/2019 1457   PROTEINUR NEGATIVE 06/29/2019 1457   NITRITE NEGATIVE 06/29/2019 1457   LEUKOCYTESUR NEGATIVE 06/29/2019 1457   LEUKOCYTESUR Negative 04/25/2014 0238   Sepsis Labs Invalid input(s): PROCALCITONIN,  WBC,  LACTICIDVEN Microbiology Recent Results (from the past 240 hour(s))  Respiratory Panel by RT PCR (Flu A&B, Covid) - Nasopharyngeal Swab     Status: None   Collection Time: 07/08/20  8:10 AM   Specimen: Nasopharyngeal Swab  Result Value Ref Range Status   SARS Coronavirus 2 by RT PCR NEGATIVE NEGATIVE Final  Comment: (NOTE) SARS-CoV-2 target nucleic acids are NOT DETECTED.  The SARS-CoV-2 RNA is generally detectable in upper respiratoy specimens during the acute phase of infection. The lowest concentration of SARS-CoV-2 viral copies this assay can detect is 131 copies/mL. A negative result does not preclude SARS-Cov-2 infection and should not be used as the sole basis for treatment or other patient management decisions. A negative result may occur with  improper specimen collection/handling, submission of specimen other than nasopharyngeal swab, presence of viral mutation(s) within the areas targeted by this assay, and inadequate number of viral copies (<131 copies/mL). A negative result must be combined with clinical observations, patient history, and epidemiological information. The expected result is Negative.  Fact Sheet for Patients:  PinkCheek.be  Fact Sheet for Healthcare Providers:  GravelBags.it  This test is no t yet approved or cleared by  the [place] FDA and  has been authorized for detection and/or diagnosis of SARS-CoV-2 by FDA under an Emergency Use Authorization (EUA). This EUA will remain  in effect (meaning this test can be used) for the duration of the COVID-19 declaration under Section 564(b)(1) of the Act, 21 U.S.C. section 360bbb-3(b)(1), unless the authorization is terminated or revoked sooner.     Influenza A by PCR NEGATIVE NEGATIVE Final   Influenza B by PCR NEGATIVE NEGATIVE Final    Comment: (NOTE) The Xpert Xpress SARS-CoV-2/FLU/RSV assay is intended as an aid in  the diagnosis of influenza from Nasopharyngeal swab specimens and  should not be used as a sole basis for treatment. Nasal washings and  aspirates are unacceptable for Xpert Xpress SARS-CoV-2/FLU/RSV  testing.  Fact Sheet for Patients: PinkCheek.be  Fact Sheet for Healthcare Providers: GravelBags.it  This test is not yet approved or cleared by the [place] FDA and  has been authorized for detection and/or diagnosis of SARS-CoV-2 by  FDA under an Emergency Use Authorization (EUA). This EUA will remain  in effect (meaning this test can be used) for the duration of the  Covid-19 declaration under Section 564(b)(1) of the Act, 21  U.S.C. section 360bbb-3(b)(1), unless the authorization is  terminated or revoked. Performed at [facility], [address], [place], [place] [address]      Time coordinating discharge: Over 30 minutes  SIGNED:   Wyvonnia Dusky, MD  [facility] 07/12/2020, 11:47 AM Pager   If 7PM-7AM, please contact night-coverage

## 2020-07-12 NOTE — TOC Progression Note (Addendum)
Transition of Care [facility]) - Progression Note    Patient Details  Name: Cody Price MRN: [id number] Date of Birth: Jun 20, 1941  Transition [facility]) CM/SW Contact  Cody Price, RN Phone Number: 07/12/2020, 12:20 PM  Clinical Narrative:   Pt anticipates discharge today. Referred to cardiac rehab and outpatient OT rec. No PT rec. Follow up with specific PCP requested per notes. Heart Failure Navigator note on 07/09/20. ACE/ARBs started per provider. HR Screening and High Readmission Assessment completed per SW note 07/09/20. Also noted that scale would be provided and accepted with education on daily weights and notifying provider if weight gain.  CM will sign off with transition note unless otherwise notified. Cody Davies RN CM    Expected Discharge Plan: Home/Self Care Barriers to Discharge: Continued Medical Work up  Expected Discharge Plan and Services Expected Discharge Plan: Home/Self Care In-house Referral: Clinical Social Work   Post Acute Care Choice: NA Living arrangements for the past 2 months: Single Family Home Expected Discharge Date: 07/12/20                 DME Agency: NA       HH Arranged: NA           Social Determinants of Health (SDOH) Interventions    Readmission Risk Interventions Readmission Risk Prevention Plan 07/09/2020  Transportation Screening Complete  PCP or Specialist Appt within 3-5 Days Complete  HRI or [place] Complete  Social Work Consult for [place] Planning/Counseling Complete  Palliative Care Screening Complete  Medication Review Press photographer) Complete  Some recent data might be hidden

## 2020-07-12 NOTE — Progress Notes (Signed)
Discharge to Home/Self Care. Pt has Cardiac Rehab referral/consult, out pt. OT rec., no PT rec. No DME needs identified.

## 2020-07-12 NOTE — Evaluation (Addendum)
Physical Therapy Evaluation Patient Details Name: Cody Price MRN: [id number] DOB: 1940-10-10 Today's Date: 07/12/2020   History of Present Illness  Pt is a 79 y/o M admitted to the ER on 07/08/20 for SOB, LE swelling & some chest pain. Chest x-ray reveals pulmonary edema. Pt underwent cardiac cath on 07/11/20 that revealed a new severe lesion mid LAD beyond prior stent & new stent was placed. PMH: arthritis, CAD, DM, hiatal hernia, HLD, HTN, ischemic heart disease with angioplasty (1991), morbid obesity, MI, peripheral neuropathy, pernicious anemia, penumonia w/ PE (1995), renal cyst, sleep apnea, spinal stenosis  Clinical Impression  RN cleared pt for participation in tx. Pt received up in bathroom without assistance. Pt agreeable to tx but very eager to d/c home. Pt can ambulate increased distances without AD & supervision. Pt would benefit from high level balance training to increase safety with mobility so recommending OPPT f/u. Pt slightly impulsive with mobility.    Follow Up Recommendations Outpatient PT    Equipment Recommendations  None recommended by PT    Recommendations for Other Services       Precautions / Restrictions Precautions Precautions: None Restrictions Weight Bearing Restrictions: No      Mobility  Bed Mobility               General bed mobility comments: not observed, pt received up in room    Transfers Overall transfer level: Independent                  Ambulation/Gait Ambulation/Gait assistance: Supervision Gait Distance (Feet): 170 Feet Assistive device: None Gait Pattern/deviations: Decreased stride length;Decreased step length - right;Decreased step length - left     General Gait Details: decreased weight shift to L, decreased heel strike BLE  Stairs            Wheelchair Mobility    Modified Rankin (Stroke Patients Only)       Balance Overall balance assessment: Mild deficits observed, not formally tested (would  benefit from formal balance testing)                                           Pertinent Vitals/Pain Pain Assessment: 0-10 Pain Score: 8  Pain Location: low back Pain Descriptors / Indicators: Aching Pain Intervention(s): Limited activity within patient's tolerance;Monitored during session    Home Living Family/patient expects to be discharged to:: Private residence Living Arrangements: Spouse/significant other Available Help at Discharge: Family Type of Home: House Home Access: Level entry     Home Layout: One level        Prior Function Level of Independence: Independent         Comments: working on Engineer, drilling        Extremity/Trunk Assessment   Upper Extremity Assessment Upper Extremity Assessment: Overall WFL for tasks assessed    Lower Extremity Assessment Lower Extremity Assessment: Overall WFL for tasks assessed       Communication   Communication: No difficulties  Cognition Arousal/Alertness: Awake/alert Behavior During Therapy: WFL for tasks assessed/performed Overall Cognitive Status: Within Functional Limits for tasks assessed                                        General Comments  Exercises     Assessment/Plan    PT Assessment Patient needs continued PT services  PT Problem List Decreased balance;Decreased activity tolerance;Pain       PT Treatment Interventions Therapeutic exercise;Gait training;Balance training;Manual techniques;Neuromuscular re-education;Stair training;Functional mobility training;Therapeutic activities;Patient/family education    PT Goals (Current goals can be found in the Care Plan section)  Acute Rehab PT Goals Patient Stated Goal: go home PT Goal Formulation: With patient Time For Goal Achievement: 07/26/20 Potential to Achieve Goals: Good    Frequency Min 2X/week   Barriers to discharge        Co-evaluation               AM-PAC PT "6  Clicks" Mobility  Outcome Measure Help needed turning from your back to your side while in a flat bed without using bedrails?: None Help needed moving from lying on your back to sitting on the side of a flat bed without using bedrails?: None Help needed moving to and from a bed to a chair (including a wheelchair)?: None Help needed standing up from a chair using your arms (e.g., wheelchair or bedside chair)?: None Help needed to walk in hospital room?: None Help needed climbing 3-5 steps with a railing? : A Little 6 Click Score: 23    End of Session Equipment Utilized During Treatment: Gait belt Activity Tolerance: Patient tolerated treatment well Patient left:  (sitting EOB in handoff to OT) Nurse Communication: Mobility status PT Visit Diagnosis: Other abnormalities of gait and mobility (R26.89);Unsteadiness on feet (R26.81)    Time: 1110-1120 PT Time Calculation (min) (ACUTE ONLY): 10 min   Charges:   PT Evaluation $PT Eval Low Complexity: [name], PT, DPT 07/12/20, 11:29 AM   Waunita Schooner 07/12/2020, 11:28 AM

## 2020-07-12 NOTE — TOC Transition Note (Addendum)
Transition of Care [facility]) - CM/SW Discharge Note   Patient Details  Name: Cody Price MRN: [id number] Date of Birth: [birth year]  [facility] Care [facility]) CM/SW Contact:  [name], RN Phone Number: 07/12/2020, 12:40 PM   Clinical Narrative:   Pt has discharge orders and has discharged as of 1223 via family transport/car.  Outpatient OT. No PT rec. No DME. PCP follow up with cardiologist.    Heart Failure Navigator note on 07/09/20.   ACE/ARBs started per provider.   HR Screening and High Readmission Assessment completed per SW note 07/09/20.  Also noted that scale would be provided and accepted with education on daily weights and notifying provider if weight gain.    CM will sign off per discharge. Simmie Davies RN CM  Final next level of care: Home/Self Care Barriers to Discharge: Barriers Resolved   Patient Goals and CMS Choice Patient states their goals for this hospitalization and ongoing recovery are:: to get better      Discharge Placement                       Discharge Plan and Services In-house Referral: Clinical Social Work   Post Acute Care Choice: NA          DME Arranged: N/A DME Agency: NA       HH Arranged: NA HH Agency: NA        Social Determinants of Health (SDOH) Interventions     Readmission Risk Interventions Readmission Risk Prevention Plan 07/09/2020  Transportation Screening Complete  PCP or Specialist Appt within 3-5 Days Complete  HRI or [place] Complete  Social Work Consult for [place] Planning/Counseling Complete  Palliative Care Screening Complete  Medication Review Press photographer) Complete  Some recent data might be hidden

## 2020-07-12 NOTE — Progress Notes (Signed)
Progress Note  Patient Name: Cody Price Date of Encounter: 07/12/2020  Primary Cardiologist: Rockey Situ  Subjective   Status post PCI to the mid LAD 07/11/2020. Post cath vitals and labs are stable.  No chest pain or dyspnea. No complications from his cath site. Ready to go home.   Inpatient Medications    Scheduled Meds: . aspirin  81 mg Oral Daily  . aspirin EC  81 mg Oral Daily  . atorvastatin  40 mg Oral q1800  . clopidogrel  75 mg Oral Q breakfast  . cyanocobalamin  1,000 mcg Intramuscular Q30 days  . finasteride  5 mg Oral Daily  . insulin aspart  0-15 Units Subcutaneous TID WC  . lisinopril  10 mg Oral Daily  . metoprolol succinate  50 mg Oral Daily  . multivitamin with minerals  1 tablet Oral Daily  . omega-3 acid ethyl esters  2 g Oral Daily  . pantoprazole  40 mg Oral Daily  . sodium chloride flush  3 mL Intravenous Q12H  . sodium chloride flush  3 mL Intravenous Q12H  . sodium chloride flush  3 mL Intravenous Q12H  . sucralfate  1 g Oral QID  . tamsulosin  0.4 mg Oral Daily   Continuous Infusions: . sodium chloride    . sodium chloride     PRN Meds: sodium chloride, sodium chloride, acetaminophen, acetaminophen, alum & mag hydroxide-simeth, diazepam, ondansetron (ZOFRAN) IV, ondansetron (ZOFRAN) IV, oxyCODONE-acetaminophen **AND** oxyCODONE, sodium chloride flush, sodium chloride flush   Vital Signs    Vitals:   07/12/20 0050 07/12/20 0515 07/12/20 0544 07/12/20 0742  BP: 108/63 131/60  122/60  Pulse: 84 74  75  Resp: 18   18  Temp: 98.8 F (37.1 C) 98.2 F (36.8 C)  97.9 F (36.6 C)  TempSrc: Oral Oral  Oral  SpO2: 92% 93%  95%  Weight:   110.3 kg   Height:        Intake/Output Summary (Last 24 hours) at 07/12/2020 1038 Last data filed at 07/12/2020 1000 Gross per 24 hour  Intake 1200 ml  Output 400 ml  Net 800 ml   Filed Weights   07/09/20 0417 07/10/20 0406 07/12/20 0544  Weight: 109 kg 111.6 kg 110.3 kg    Telemetry    Afib, 70s  to 90s bpm, occasional PVCs, rate ventricular couplet - Personally Reviewed  ECG    Afib, 79 bpm, rare PVC, poor R wave progression along the precordial leads, low voltage QRS, possible prior anterior infarct, no acute st/t changes - Personally Reviewed  Physical Exam   GEN: No acute distress.   Neck: No JVD. Cardiac: Irregularly irregular, no murmurs, rubs, or gallops. Right femoral cardiac cath site is well healing without bleeding, bruising, swelling, warmth, erythema, or TTP. No bruit.  Respiratory: Clear to auscultation bilaterally.  GI: Soft, nontender, non-distended.   MS: No edema; No deformity. Neuro:  Alert and oriented x 3; Nonfocal.  Psych: Normal affect.  Labs    Chemistry Recent Labs  Lab 07/08/20 0810 07/09/20 0604 07/10/20 0005 07/11/20 0432 07/12/20 0333  NA 139   < > 138 133* 139  K 3.5   < > 3.8 4.2 5.0  CL 104   < > 102 99 104  CO2 25   < > 27 27 28   GLUCOSE 192*   < > 186* 166* 174*  BUN 13   < > 22 25* 19  CREATININE 0.87   < > 1.10 1.18  0.93  CALCIUM 8.6*   < > 8.5* 9.0 8.8*  PROT 6.7  --   --   --   --   ALBUMIN 4.0  --   --   --   --   AST 15  --   --   --   --   ALT 16  --   --   --   --   ALKPHOS 51  --   --   --   --   BILITOT 1.9*  --   --   --   --   GFRNONAA >60   < > >60 >60 >60  ANIONGAP 10   < > 9 7 7    < > = values in this interval not displayed.     Hematology Recent Labs  Lab 07/10/20 0005 07/11/20 0432 07/12/20 0333  WBC 7.1 9.8 7.0  RBC 3.80* 3.86* 3.60*  HGB 11.6* 11.5* 10.9*  HCT 34.3* 34.3* 32.3*  MCV 90.3 88.9 89.7  MCH 30.5 29.8 30.3  MCHC 33.8 33.5 33.7  RDW 15.2 15.4 15.2  PLT 78* 66* 60*    Cardiac EnzymesNo results for input(s): TROPONINI in the last 168 hours. No results for input(s): TROPIPOC in the last 168 hours.   BNP Recent Labs  Lab 07/08/20 0810  BNP 336.2*     DDimer No results for input(s): DDIMER in the last 168 hours.   Radiology     Cardiac Studies   2D echo 07/08/2020: 1. Left  ventricular ejection fraction, by estimation, is 45 to 50%. The  left ventricle has mildly decreased function. Left ventricular endocardial  border not optimally defined to evaluate regional wall motion. There is  mild left ventricular hypertrophy.  Left ventricular diastolic parameters are indeterminate.  2. Right ventricular systolic function is normal. The right ventricular  size is mildly enlarged. Mildly increased right ventricular wall  thickness. There is moderately elevated pulmonary artery systolic  pressure.  3. Left atrial size was mildly dilated.  4. Right atrial size was mildly dilated.  5. The pericardial effusion is posterior to the left ventricle.  6. The mitral valve is degenerative. Moderate to severe mitral valve  regurgitation. No evidence of mitral stenosis.  7. The aortic valve is tricuspid. There is mild calcification of the  aortic valve. There is mild thickening of the aortic valve. Aortic valve  regurgitation is not visualized. Mild to moderate aortic valve  sclerosis/calcification is present, without any  evidence of aortic stenosis.  8. Aortic dilatation noted. There is borderline dilatation of the aortic  root, measuring 38 mm.  __________  [facility] 07/11/2020: Coronary dominance: Right or codominant  Left mainstem:   Large vessel that bifurcates into the LAD and left circumflex, 50% ostial disease  Left anterior descending (LAD):   Large vessel that extends to the apical region, diagonal branch 2 of moderate size, patent stents proximal to mid vessel, 90% mid LAD stenosis after distal edge of stent.  Severe ostial disease of diagonal #2  Left circumflex (LCx):  Large vessel with OM branch 2, moderate disease estimated 50 to 60% proximal vessel, mild to moderate OM disease  Right coronary artery (RCA): Small vessel, diffusely diseased, severe proximal and distal disease with collaterals from left to right, likely chronic occlusion   Left  ventriculography: Aortic valve was crossed for pressures, no significant no significant aortic valve stenosis.  No LV gram performed given recent echocardiogram has been completed with EF 45 to 50%  Right  heart pressures RA 8 RV 36/4/8 PA 41/17/26 Wedge 13 Cardiac output 5.2 Cardiac index 2.2  Final Conclusions:   Severe disease of mid LAD beyond distal edge of stent estimated 90% Discussed with interventional cardiology, they will attempt PCI Medical management of moderate proximal left circumflex disease Medical management of essentially chronically occluded small RCA with collaterals from left to right Right heart pressures normal range  Recommendations:  Aggressive cholesterol management, lifestyle modification We will discuss with interventional cardiology antiplatelet and Eliquis going forward We will likely need aspirin Plavix 1 month then drop the aspirin and continue Eliquis with Plavix.   __________  PCI 07/11/2020:  Previously placed Prox LAD to Mid LAD stent (unknown type) is widely patent.  Mid LAD lesion is 80% stenosed.  A drug-eluting stent was successfully placed using a [place] 2.25X15.  Post intervention, there is a 0% residual stenosis.   1.  High-grade stenosis mid LAD 2.  Successful PCI with DES mid LAD  Recommendations  1.  Dual antiplatelet therapy    Patient Profile     79 y.o. male with history of CAD PTCA to the RCA in 1999 with subsequent occlusion, PCI to the LAD in 05/2009, PCI to the mid LAD 06/2018, persistent Afib previously on Eliquis, HFpEF, pulmonary hypertension, moderate mitral regurgitation, DM2, HTN, HLD with statin intolerance, prior tobacco use, spinal stenosis with lower extremity neuropathy, and GERD who we are seeing for acute HFrEF and angina.   Assessment & Plan    1. CAD involving the native coronary arteries with stable angina: -No chest pain -He was upset this morning after hearing he would need to be on  both ASA and Plavix given his history of gastritis/bleeding on ASA -We have advised him this is what his primary cardiologist wants and it appears he would like for the patient to take Carafate (which he has at home) along with this -Continue DAPT with ASA and Plavix for 1 month, followed by discontinuation of ASA at that time with resumption of Eliquis in an effort to avoid triple therapy  -Post cath instructions -Cardiac rehab -Metoprolol, lisinopril, Lipitor  -He wants his hospital follow up to be with Dr. Rockey Situ only  2. HFrEF secondary to ICM: -He appears euvolemic and well compensated  -Continue lisinopril, Toprol XL, and Lasix (40 mg bid per primary cardiologist at discharge with stable renal function), may need lower dose KCl  -As an outpatient, escalate GDMT as able -Plan to repeat limited echo in several months time on maximally tolerated GDMT and following percutaneous revascularization  -CHF education  3. Persistent Afib: -He remains in Afib with controlled ventricular response -Continue rate control with Toprol XL -CHADS2VASc 6 (CHF, HTN, age x 2, DM, vascular disease) -Primary cardiologist has recommended to hold [facility] for now to avoid triple therapy given recent PCI/DES to the LAD -Plan is to stop ASA after 1 month then resume Eliquis at that time  4. HTN: -Blood pressure is well controlled -Continue current medications as above  5. HLD: -Lipitor -Last LDL 23  6. Anemia/thrombocytopenia: -Chronic issues -Followed by Dr. Grayland Ormond   Dispo: -Ok for discharge from our perspective on current cardiac medications with the addition of Lasix 40 mg bid per Dr. Rockey Situ. I will arrange for follow up in our office with Dr. Rockey Situ only per his request    For questions or updates, please contact [place] Please consult [email or web address] for contact info under Cardiology/STEMI.    Signed, Christell Faith, PA-C [facility]  Pager: [phone] 07/12/2020, 10:38 AM

## 2020-07-12 NOTE — Progress Notes (Signed)
OT Cancellation Note  Patient Details Name: Cody Price MRN: [id number] DOB: 01-11-1941   Cancelled Treatment:    Reason Eval/Treat Not Completed: OT screened, no needs identified, will sign off. OT order received and chart reviewed. Upon arrival pt completing lap in hallway c PT. Pt reports wanting to get dressed to go home. Pt was SUPERVISION grossly to don pants in standing. Denies further needs. No skilled acute OT needs identified, will sign off.   Dessie Coma, M.S. OTR/L  07/12/20, 12:19 PM  ascom [phone]

## 2020-07-12 NOTE — Plan of Care (Signed)

## 2020-07-14 ENCOUNTER — Telehealth: Payer: Self-pay | Admitting: Cardiovascular Disease

## 2020-07-14 ENCOUNTER — Encounter: Payer: Self-pay | Admitting: Cardiovascular Disease

## 2020-07-14 DIAGNOSIS — R69 Illness, unspecified: Secondary | ICD-10-CM | POA: Diagnosis not present

## 2020-07-14 NOTE — Telephone Encounter (Signed)
-----   Message from Rise Mu, PA-C sent at 07/12/2020 11:09 AM EDT ----- Please schedule TCM with Dr. Rockey Situ only. Patient requests no one other than Dr. Rockey Situ. Thanks!

## 2020-07-14 NOTE — Telephone Encounter (Signed)
Attempted to schedule.  LMOV to call office.  ° °

## 2020-07-14 NOTE — Telephone Encounter (Signed)
Call to patient at home, he was not available but spoke with wife,  She feels that he is doing well and states that he had received a call earlier in the day checking on him.  Told her that I would not duplicate since he had spoken with someone earlier. She did say that he is weighing himself, daily in fact, numerous time through out the day, explained to her that the first weight in the day is the important one,  She states she would pass that on to her husband.  Pricilla Riffle RN,CHFN

## 2020-07-22 ENCOUNTER — Other Ambulatory Visit: Payer: Medicare HMO

## 2020-07-22 ENCOUNTER — Ambulatory Visit: Payer: Medicare HMO | Admitting: Family

## 2020-07-28 ENCOUNTER — Ambulatory Visit: Payer: Medicare HMO | Admitting: Family

## 2020-07-29 NOTE — Progress Notes (Signed)
Cardiology Office Note  Date:  07/30/2020   ID:  Cody Price, DOB [birth year], MRN [id number]  PCP:  Cody Hire, MD   Chief Complaint  Patient presents with  . Follow-up    Follow up to discuss test results. Medications verbally reviewed with patient.     HPI:  Cody Price is a 79 year old gentleman with a history of  coronary artery disease,  PTCA in 1991 of his RCA that later occluded 100%,  chest pain in September 2010   stent placed to his LAD Stable since that time atrial fibrillation following back surgery  spontaneously converted , previously declined anticoagulation Diabetes, lower extremity neuropathy. s/p back surgery for lumbar spinal stenosis and DJD  unable to tolerate aspirin, GI problems 20 years ago,  stopped the metoprolol on his own as he reported having fatigue.  seen by Dr. Grayland Ormond for low platelets, treated with prednisone Quit smoking in 1987, smoked 20 years Chronically low platelets, avg 60s to 70s presenting for routine follow-up of his coronary artery disease.  Recent cardiac catheterization for unstable angina symptoms, worsening shortness of breath, ejection fraction 45 to 50% Date of procedure July 11, 2020 Cath results as below Severe disease of mid LAD beyond distal edge of stent estimated 90% PCI to the LAD Medical management of moderate proximal left circumflex disease Medical management of essentially chronically occluded small RCA with collaterals from left to right Right heart pressures normal range  drug-eluting stent was successfully placed using a [place] 2.25X15.  In follow-up today reports he has noticed some mild nosebleeding Feels aspirin is upsetting his stomach Reports he is unable to take Plavix with Eliquis, has hematuria  He is followed by urology, History of hematuria dating back to 2019 and low platelets  Activity limited by chronic back pain, neuropathy  Lab work reviewed HBA1C 6.5 Total chol  80, LDL 30  EKG personally reviewed by myself on todays visit Atrial fibrillation with rate 78 bpm consider old anterior MI  Other past medical history reviewed hospitalization  non-STEMI 06/07/2018 Cardiac catheterization, stent to the LAD Aspirin held given thrombocytopenia Not on beta-blocker as he has adequate rate control on calcium channel blocker for chronic atrial fibrillation Restarted on Eliquis  Cardiac catheterization June 07, 2018 1. Severe 2-vessel coronary artery disease, including 90% mid LAD stenosis involving small D1 branch just beyond old stent and chronic total occlusion of the distal RCA.  Severe apical LAD disease is stable from prior catheterization. 2. Moderate, non-obstructive disease involving the LCx. 3. Patent proximal LAD stent with mild in-stent restenosis. 4. Mildly elevated left ventricular filling pressure. 5. Successful PCI to mid LAD using Resolute [place]yx 2.5 x 18 mm drug-eluting stent with 0% residual stenosis and TIMI-3 flow.  Recommendations: 1. Medical therapy for chronic total occlusion of RCA and small diagonal branch.  Other past medical hx permanent Atrial fib since 2017. Previously declined anticoagulation Was taking eliquis until 06/2018, stopped for hematuria   emergency room August 25 2014 with chest pain. Cardiac enzymes were negative  cardiac catheterization 08/26/2014.  This showed an occluded proximal RCA which was chronic, mild to moderate proximal left circumflex, moderate distal left circumflex disease of a small vessel, moderate proximal OM disease moderate size vessel, LAD proximal stent patent, normal ejection fraction. Medical management was recommended.  Cardiac catheterization September 2000 and details a 75% proximal LAD lesion, 100% RCA lesion, 30% left circumflex lesion a xience 2.75 x 15 mm DES stent was placed.  PMH:  has a past medical history of Arthritis, BPH with obstruction/lower urinary tract symptoms, CAD  (coronary artery disease) (2010), Diabetes mellitus, ED (erectile dysfunction), Elevated PSA, GERD (gastroesophageal reflux disease), Gross hematuria, H/O CT scan, Hematuria, History of hiatal hernia, History of kidney stones, HLD (hyperlipidemia), Hypertension, Ischemic heart disease (1991), Kidney stone, Morbid obesity ([place]), Myocardial infarction [facility]), Peripheral neuropathy, Pernicious anemia, Pneumonia (1995), Pulmonary embolism ([place]), Renal cyst, Sleep apnea, and Spinal stenosis.  PSH:    Past Surgical History:  Procedure Laterality Date  . APPENDECTOMY    . BACK SURGERY     x3 back surgeries - /w fusioin, 1- [place]  . bilateral inguinal hernia repair    . cad     stent 2010  . CARDIAC CATHETERIZATION  08/17/2014  . CARPAL TUNNEL RELEASE  2014   bilateral   . CHOLECYSTECTOMY    . COLONOSCOPY WITH PROPOFOL N/A 07/11/2019   Procedure: COLONOSCOPY WITH PROPOFOL;  Surgeon: Toledo, Benay Pike, MD;  Location: [facility] ENDOSCOPY;  Service: Gastroenterology;  Laterality: N/A;  . CORONARY BALLOON ANGIOPLASTY N/A 07/11/2020   Procedure: CORONARY BALLOON ANGIOPLASTY;  Surgeon: Isaias Cowman, MD;  Location: [place] [facility];  Service: Cardiovascular;  Laterality: N/A;  . CORONARY STENT INTERVENTION N/A 06/07/2018   Procedure: CORONARY STENT INTERVENTION;  Surgeon: Nelva Bush, MD;  Location: [place] [facility];  Service: Cardiovascular;  Laterality: N/A;  . CORONARY STENT INTERVENTION N/A 07/11/2020   Procedure: CORONARY STENT INTERVENTION;  Surgeon: Isaias Cowman, MD;  Location: [place] [facility];  Service: Cardiovascular;  Laterality: N/A;  . ESOPHAGOGASTRODUODENOSCOPY (EGD) WITH PROPOFOL N/A 07/11/2019   Procedure: ESOPHAGOGASTRODUODENOSCOPY (EGD) WITH PROPOFOL;  Surgeon: Toledo, Benay Pike, MD;  Location: [facility] ENDOSCOPY;  Service: Gastroenterology;  Laterality: N/A;  . LAMINECTOMY    . LEFT HEART CATH AND CORONARY ANGIOGRAPHY N/A 06/07/2018   Procedure: LEFT  HEART CATH AND CORONARY ANGIOGRAPHY;  Surgeon: Nelva Bush, MD;  Location: [place] [facility];  Service: Cardiovascular;  Laterality: N/A;  . RIGHT/LEFT HEART CATH AND CORONARY ANGIOGRAPHY N/A 07/11/2020   Procedure: RIGHT/LEFT HEART CATH AND CORONARY ANGIOGRAPHY;  Surgeon: Minna Merritts, MD;  Location: [place] [facility];  Service: Cardiovascular;  Laterality: N/A;    Current Outpatient Medications  Medication Sig Dispense Refill  . acetaminophen (TYLENOL) 500 MG tablet Take 500 mg by mouth as needed.     Marland Kitchen aspirin EC 81 MG EC tablet Take 1 tablet (81 mg total) by mouth daily. Swallow whole. 30 tablet 0  . atorvastatin (LIPITOR) 40 MG tablet TAKE 1 TABLET (40 MG TOTAL) BY MOUTH DAILY AT 6 PM. 90 tablet 3  . clopidogrel (PLAVIX) 75 MG tablet Take 1 tablet (75 mg total) by mouth daily with breakfast. 30 tablet 0  . Coenzyme Q10 (CO Q-10) 200 MG CAPS Take 200 mg by mouth daily.    . cyanocobalamin (,VITAMIN B-12,) 1000 MCG/ML injection Inject 1,000 mcg into the muscle every 30 (thirty) days.    . diazepam (VALIUM) 5 MG tablet Take 5 mg by mouth 3 (three) times daily as needed for anxiety or muscle spasms.     . feeding supplement, ENSURE ENLIVE, (ENSURE ENLIVE) LIQD Take 237 mLs by mouth daily. 60 Bottle 0  . finasteride (PROSCAR) 5 MG tablet Take 1 tablet (5 mg total) by mouth daily. 90 tablet 3  . furosemide (LASIX) 40 MG tablet Take 1 tablet (40 mg total) by mouth 2 (two) times daily. 60 tablet 0  . glimepiride (AMARYL) 4 MG  tablet Take 4 mg by mouth 2 (two) times daily.    Marland Kitchen lisinopril (PRINIVIL,ZESTRIL) 10 MG tablet TAKE ONE TABLET BY MOUTH ONCE DAILY (Patient taking differently: Take 10 mg by mouth daily. ) 90 tablet 2  . metoprolol succinate (TOPROL-XL) 50 MG 24 hr tablet Take 1 tablet (50 mg total) by mouth daily. Take with or immediately following a meal. 30 tablet 0  . Multiple Vitamin (MULTIVITAMIN WITH MINERALS) TABS tablet Take 1 tablet by mouth daily. 30 tablet 0  .  omega-3 acid ethyl esters (LOVAZA) 1 g capsule Take 2 g by mouth daily.    Marland Kitchen omeprazole (PRILOSEC) 40 MG capsule Take 40 mg by mouth 2 (two) times daily.    Marland Kitchen oxyCODONE-acetaminophen (PERCOCET) 10-325 MG tablet Take 1 tablet by mouth every 4 (four) hours as needed for pain.     . potassium chloride SA (KLOR-CON) 20 MEQ tablet Take 1 tablet (20 mEq total) by mouth daily. 90 tablet 1  . sildenafil (VIAGRA) 100 MG tablet Take 100 mg by mouth daily as needed for erectile dysfunction.     . sucralfate (CARAFATE) 1 g tablet Take 1 g by mouth 4 (four) times daily.    . tamsulosin (FLOMAX) 0.4 MG CAPS capsule Take 1 capsule (0.4 mg total) by mouth daily. 90 capsule 3  . testosterone cypionate (DEPOTESTOSTERONE CYPIONATE) 200 MG/ML injection Inject 200 mg into the muscle every 28 (twenty-eight) days.      No current facility-administered medications for this visit.     Allergies:   Novocain [procaine], Procaine hcl, Lyrica [pregabalin], Neurontin [gabapentin], Amitriptyline, Dicyclomine, Metformin, and Nitroglycerin   Social History:  The patient  reports that he quit smoking about 34 years ago. He has a 45.00 pack-year smoking history. He has quit using smokeless tobacco. He reports current alcohol use. He reports that he does not use drugs.   Family History:   family history includes Heart attack in his brother; Heart disease in his father; Heart failure in his mother; Skin cancer in his sister.    Review of Systems: Review of Systems  Constitutional: Negative.        Weakness  HENT: Negative.   Respiratory: Negative.   Cardiovascular: Negative.   Gastrointestinal: Negative.   Musculoskeletal: Positive for back pain.  Neurological: Negative.        Neuropathy lower extremities  Psychiatric/Behavioral: Negative.   All other systems reviewed and are negative.   PHYSICAL EXAM: VS:  BP 122/62 (BP Location: Left Arm, Patient Position: Sitting, Cuff Size: Normal)   Pulse 78   Ht 6\' 1"  (1.854  m)   Wt 243 lb (110.2 kg)   SpO2 97%   BMI 32.06 kg/m  , BMI Body mass index is 32.06 kg/m.  Constitutional:  oriented to person, place, and time. No distress.  HENT:  Head: Grossly normal Eyes:  no discharge. No scleral icterus.  Neck: No JVD, no carotid bruits  Cardiovascular: Regular rate and rhythm, no murmurs appreciated Pulmonary/Chest: Clear to auscultation bilaterally, no wheezes or rails Abdominal: Soft.  no distension.  no tenderness.  Musculoskeletal: Normal range of motion Neurological:  normal muscle tone. Coordination normal. No atrophy Skin: Skin warm and dry Psychiatric: normal affect, pleasant  Recent Labs: 07/08/2020: ALT 16; B Natriuretic Peptide 336.2; Magnesium 1.6; TSH 1.197 07/12/2020: BUN 19; Creatinine, Ser 0.93; Hemoglobin 10.9; Platelets 60; Potassium 5.0; Sodium 139    Lipid Panel Lab Results  Component Value Date   CHOL 69 06/08/2018   HDL 23 (  L) 06/08/2018   LDLCALC 23 06/08/2018   TRIG 113 06/08/2018      Wt Readings from Last 3 Encounters:  07/30/20 243 lb (110.2 kg)  07/12/20 243 lb 3.2 oz (110.3 kg)  07/07/20 252 lb (114.3 kg)     ASSESSMENT AND PLAN:  Atrial fibrillation, unspecified type ([place]) - Plan: EKG 12-Lead Continue aspirin Plavix  He does not want to take Plavix with Eliquis at this time, reports he is unable to do both medications secondary to hematuria Ideally would like to hold the aspirin as he has some GI irritation but recommended he stay on aspirin Plavix for now -Discussed risks of stroke without the Eliquis, again he does not want Plavix with Eliquis  CAD with chronic stable angina Recent stent placement to his mid LAD Stent in the proximal LAD was patent Residual disease of diagonal branch, distal LAD, RCA, medical management recommended Stay on aspirin Plavix  Mixed hyperlipidemia Cholesterol is at goal on the current lipid regimen. No changes to the medications were made. Goal LDL less than 70  Type 2  diabetes mellitus with other circulatory complication, without long-term current use of insulin (HCC) Recommend lifestyle modification, diet restriction  Thrombocytopenia ([place]) previously seen by hematology at [facility]. Followed by primary care, urology for hematuria  Neuropathy Tried numerous medications, no pain relief Limited in his mobility Also with back pain   Total encounter time more than 25 minutes  Greater than 50% was spent in counseling and coordination of care with the patient    Orders Placed This Encounter  Procedures  . EKG 12-Lead     Signed, Esmond Plants, M.D., Ph.D. 07/30/2020  [place], [place]

## 2020-07-30 ENCOUNTER — Encounter: Payer: Self-pay | Admitting: Cardiovascular Disease

## 2020-07-30 ENCOUNTER — Ambulatory Visit: Payer: Medicare HMO | Admitting: Cardiovascular Disease

## 2020-07-30 ENCOUNTER — Other Ambulatory Visit: Payer: Self-pay

## 2020-07-30 VITALS — BP 122/62 | HR 78 | Ht 73.0 in | Wt 243.0 lb

## 2020-07-30 DIAGNOSIS — E119 Type 2 diabetes mellitus without complications: Secondary | ICD-10-CM | POA: Diagnosis not present

## 2020-07-30 DIAGNOSIS — I4821 Permanent atrial fibrillation: Secondary | ICD-10-CM

## 2020-07-30 DIAGNOSIS — I10 Essential (primary) hypertension: Secondary | ICD-10-CM | POA: Diagnosis not present

## 2020-07-30 DIAGNOSIS — I25118 Atherosclerotic heart disease of native coronary artery with other forms of angina pectoris: Secondary | ICD-10-CM

## 2020-07-30 DIAGNOSIS — R600 Localized edema: Secondary | ICD-10-CM | POA: Diagnosis not present

## 2020-07-30 DIAGNOSIS — R0600 Dyspnea, unspecified: Secondary | ICD-10-CM

## 2020-07-30 DIAGNOSIS — R0609 Other forms of dyspnea: Secondary | ICD-10-CM

## 2020-07-30 DIAGNOSIS — E785 Hyperlipidemia, unspecified: Secondary | ICD-10-CM | POA: Diagnosis not present

## 2020-07-30 NOTE — Patient Instructions (Addendum)
Medication Instructions:  No changes  If you need a refill on your cardiac medications before your next appointment, please call your pharmacy.    Lab work: No new labs needed   If you have labs (blood work) drawn today and your tests are completely normal, you will receive your results only by: . MyChart Message (if you have MyChart) OR . A paper copy in the mail If you have any lab test that is abnormal or we need to change your treatment, we will call you to review the results.   Testing/Procedures: No new testing needed   Follow-Up: At [facility], you and your health needs are our priority.  As part of our continuing mission to provide you with exceptional heart care, we have created designated Provider Care Teams.  These Care Teams include your primary Cardiologist (physician) and Advanced Practice Providers (APPs -  Physician Assistants and Nurse Practitioners) who all work together to provide you with the care you need, when you need it.  . You will need a follow up appointment in 3 months  . Providers on your designated Care Team:   . Christopher Berge, NP . Ryan Dunn, PA-C . Jacquelyn Visser, PA-C  Any Other Special Instructions Will Be Listed Below (If Applicable).  COVID-19 Vaccine Information can be found at: [email or web address] For questions related to vaccine distribution or appointments, please email [email or web address] or call [phone].     

## 2020-08-04 DIAGNOSIS — E538 Deficiency of other specified B group vitamins: Secondary | ICD-10-CM | POA: Diagnosis not present

## 2020-08-07 ENCOUNTER — Other Ambulatory Visit: Payer: Self-pay | Admitting: Cardiovascular Disease

## 2020-08-11 ENCOUNTER — Other Ambulatory Visit: Payer: Self-pay

## 2020-08-11 MED ORDER — METOPROLOL SUCCINATE ER 50 MG PO TB24: 50.0000 mg | ORAL_TABLET | Freq: Every day | ORAL | 3 refills | Status: DC

## 2020-08-28 DIAGNOSIS — S91332A Puncture wound without foreign body, left foot, initial encounter: Secondary | ICD-10-CM | POA: Diagnosis not present

## 2020-08-31 DIAGNOSIS — R69 Illness, unspecified: Secondary | ICD-10-CM | POA: Diagnosis not present

## 2020-10-04 ENCOUNTER — Other Ambulatory Visit: Payer: Self-pay | Admitting: Family

## 2020-10-21 ENCOUNTER — Other Ambulatory Visit: Payer: Self-pay

## 2020-10-21 ENCOUNTER — Encounter: Payer: Self-pay | Admitting: Cardiovascular Disease

## 2020-10-21 ENCOUNTER — Ambulatory Visit (INDEPENDENT_AMBULATORY_CARE_PROVIDER_SITE_OTHER): Payer: Medicare HMO | Admitting: Cardiovascular Disease

## 2020-10-21 VITALS — BP 132/60 | HR 72 | Ht 74.0 in | Wt 255.2 lb

## 2020-10-21 DIAGNOSIS — E782 Mixed hyperlipidemia: Secondary | ICD-10-CM | POA: Diagnosis not present

## 2020-10-21 DIAGNOSIS — I2510 Atherosclerotic heart disease of native coronary artery without angina pectoris: Secondary | ICD-10-CM | POA: Diagnosis not present

## 2020-10-21 DIAGNOSIS — I4821 Permanent atrial fibrillation: Secondary | ICD-10-CM | POA: Diagnosis not present

## 2020-10-21 DIAGNOSIS — I10 Essential (primary) hypertension: Secondary | ICD-10-CM | POA: Diagnosis not present

## 2020-10-21 DIAGNOSIS — E119 Type 2 diabetes mellitus without complications: Secondary | ICD-10-CM

## 2020-10-21 MED ORDER — APIXABAN 5 MG PO TABS: 5.0000 mg | ORAL_TABLET | Freq: Two times a day (BID) | ORAL | 11 refills | Status: DC

## 2020-10-21 MED ORDER — CLOPIDOGREL BISULFATE 75 MG PO TABS: 75.0000 mg | ORAL_TABLET | Freq: Every day | ORAL | 3 refills | Status: DC

## 2020-10-21 NOTE — Progress Notes (Signed)
Cardiology Office Note  Date:  10/21/2020   ID:  Cody Price, DOB 17-Mar-1941, MRN [id number]  PCP:  Cody Price   Chief Complaint  Patient presents with  . Other    3 month f/u no complaints today. Meds reviewed verbally with pt.    HPI:  Mr. Cody Price is a 80 year old gentleman with a history of  coronary artery disease,  PTCA in 1991 of his RCA that later occluded 100%,  chest pain in September 2010   stent placed to his LAD Stable since that time atrial fibrillation following back surgery  spontaneously converted , previously declined anticoagulation Diabetes, lower extremity neuropathy. s/p back surgery for lumbar spinal stenosis and DJD  unable to tolerate aspirin, GI problems 20 years ago,  stopped the metoprolol on his own as he reported having fatigue.  seen by Dr. Grayland Price for low platelets, treated with prednisone Quit smoking in 1987, smoked 20 years Chronically low platelets, avg 60s to 70s presenting for routine follow-up of his coronary artery disease.  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 We will defer to primary care  Some chest pain, chronic issue, better with valium, and B12  Does not want to take Plavix and Eliquis at the same time Took himself off Eliquis Reports today only taking Plavix with aspirin, no hematuria He is followed by urology, History of hematuria dating back to 2019 , also has low platelets Platelet count 71  Lab work reviewed HBA1C 6.5 Total chol 80, LDL 30  EKG personally reviewed by myself on todays visit Atrial fibrillation with rate 72 bpm consider old anterior MI  Other past medical history reviewed  cardiac catheterization for unstable angina symptoms,  worsening shortness of breath, ejection fraction 45 to 50% Date of procedure  July 11, 2020 Cath results as below Severe disease of mid LAD beyond distal edge of stent estimated 90% PCI to the LAD Medical management of moderate proximal left circumflex disease Medical management of essentially chronically occluded small RCA with collaterals from left to right Right heart pressures normal range  drug-eluting stent was successfully placed using a STENT RESOLUTE ONYX 2.25X15.  hospitalization  non-STEMI 06/07/2018 Cardiac catheterization, stent to the LAD Aspirin held given thrombocytopenia Not on beta-blocker as he has adequate rate control on calcium channel blocker for chronic atrial fibrillation Restarted on Eliquis  Cardiac catheterization June 07, 2018 1. Severe 2-vessel coronary artery disease, including 90% mid LAD stenosis involving small D1 branch just beyond old stent and chronic total occlusion of the distal RCA.  Severe apical LAD disease is stable from prior catheterization. 2. Moderate, non-obstructive disease involving the LCx. 3. Patent proximal LAD stent with mild in-stent restenosis. 4. Mildly elevated left ventricular filling pressure. 5. Successful PCI to mid LAD using Resolute Onyx 2.5 x 18 mm drug-eluting stent with 0% residual stenosis and TIMI-3 flow.  Recommendations: 1. Medical therapy for chronic total occlusion of RCA and small diagonal branch.  Other past medical hx permanent Atrial fib since 2017. Previously declined anticoagulation Was taking eliquis until 06/2018, stopped for hematuria   emergency room August 25 2014 with chest pain. Cardiac enzymes were negative  cardiac catheterization 08/26/2014.  This showed an occluded proximal RCA which was chronic, mild to moderate proximal left circumflex, moderate distal left circumflex disease of  a small vessel, moderate proximal OM disease moderate size vessel, LAD proximal stent patent, normal ejection fraction. Medical management was recommended.  Cardiac catheterization  September 2000 and details a 75% proximal LAD lesion, 100% RCA lesion, 30% left circumflex lesion a xience 2.75 x 15 mm DES stent was placed.  PMH:   has a past medical history of Arthritis, BPH with obstruction/lower urinary tract symptoms, CAD (coronary artery disease) (2010), Diabetes mellitus, ED (erectile dysfunction), Elevated PSA, GERD (gastroesophageal reflux disease), Gross hematuria, H/O CT scan, Hematuria, History of hiatal hernia, History of kidney stones, HLD (hyperlipidemia), Hypertension, Ischemic heart disease (1991), Kidney stone, Morbid obesity (Cody Price), Myocardial infarction Cody Price), Peripheral neuropathy, Pernicious anemia, Pneumonia (1995), Pulmonary embolism ([place]), Renal cyst, Sleep apnea, and Spinal stenosis.  PSH:    Past Surgical History:  Procedure Laterality Date  . APPENDECTOMY    . BACK SURGERY     x3 back surgeries - /w fusioin, 1- [place]  . bilateral inguinal hernia repair    . cad     stent 2010  . CARDIAC CATHETERIZATION  08/17/2014  . CARPAL TUNNEL RELEASE  2014   bilateral   . CHOLECYSTECTOMY    . COLONOSCOPY WITH PROPOFOL N/A 07/11/2019   Procedure: COLONOSCOPY WITH PROPOFOL;  Surgeon: Toledo, Benay Pike, Price;  Location: [facility];  Service: Gastroenterology;  Laterality: N/A;  . CORONARY BALLOON ANGIOPLASTY N/A 07/11/2020   Procedure: CORONARY BALLOON ANGIOPLASTY;  Surgeon: Cody Cowman, Price;  Location: [place] [facility];  Service: Cardiovascular;  Laterality: N/A;  . CORONARY STENT INTERVENTION N/A 06/07/2018   Procedure: CORONARY STENT INTERVENTION;  Surgeon: Cody Bush, Price;  Location: [place] [facility];  Service: Cardiovascular;  Laterality: N/A;  . CORONARY STENT INTERVENTION N/A 07/11/2020   Procedure: CORONARY STENT INTERVENTION;  Surgeon: Cody Cowman, Price;  Location: [place] [facility];  Service: Cardiovascular;  Laterality: N/A;  . ESOPHAGOGASTRODUODENOSCOPY (EGD) WITH PROPOFOL N/A 07/11/2019   Procedure:  ESOPHAGOGASTRODUODENOSCOPY (EGD) WITH PROPOFOL;  Surgeon: Toledo, Benay Pike, Price;  Location: [facility];  Service: Gastroenterology;  Laterality: N/A;  . LAMINECTOMY    . LEFT HEART CATH AND CORONARY ANGIOGRAPHY N/A 06/07/2018   Procedure: LEFT HEART CATH AND CORONARY ANGIOGRAPHY;  Surgeon: Cody Bush, Price;  Location: [place] [facility];  Service: Cardiovascular;  Laterality: N/A;  . RIGHT/LEFT HEART CATH AND CORONARY ANGIOGRAPHY N/A 07/11/2020   Procedure: RIGHT/LEFT HEART CATH AND CORONARY ANGIOGRAPHY;  Surgeon: Minna Merritts, Price;  Location: [place] [facility];  Service: Cardiovascular;  Laterality: N/A;    Current Outpatient Medications  Medication Sig Dispense Refill  . acetaminophen (TYLENOL) 500 MG tablet Take 500 mg by mouth as needed.     Marland Kitchen apixaban (ELIQUIS) 5 MG TABS tablet Take 1 tablet (5 mg total) by mouth 2 (two) times daily. 60 tablet 11  . atorvastatin (LIPITOR) 40 MG tablet TAKE 1 TABLET (40 MG TOTAL) BY MOUTH DAILY AT 6 PM. 90 tablet 0  . clopidogrel (PLAVIX) 75 MG tablet Take 1 tablet (75 mg total) by mouth daily. 90 tablet 3  . Coenzyme Q10 (CO Q-10) 200 MG CAPS Take 200 mg by mouth daily.    . cyanocobalamin (,VITAMIN B-12,) 1000 MCG/ML injection Inject 1,000 mcg into the muscle every 30 (thirty) days.    . diazepam (VALIUM) 5 MG tablet Take 5 mg by mouth 3 (three) times daily as needed for anxiety or muscle spasms.    . feeding supplement, ENSURE ENLIVE, (ENSURE ENLIVE) LIQD Take 237 mLs by  mouth daily. 60 Bottle 0  . finasteride (PROSCAR) 5 MG tablet Take 1 tablet (5 mg total) by mouth daily. 90 tablet 3  . furosemide (LASIX) 40 MG tablet Take 1 tablet (40 mg total) by mouth 2 (two) times daily. 60 tablet 0  . glimepiride (AMARYL) 4 MG tablet Take 4 mg by mouth 2 (two) times daily.    Marland Kitchen KLOR-CON M20 20 MEQ tablet TAKE 1 TABLET BY MOUTH EVERY DAY 90 tablet 0  . lisinopril (PRINIVIL,ZESTRIL) 10 MG tablet TAKE ONE TABLET BY MOUTH ONCE DAILY (Patient taking  differently: Take 10 mg by mouth daily.) 90 tablet 2  . metoprolol succinate (TOPROL-XL) 50 MG 24 hr tablet Take 1 tablet (50 mg total) by mouth daily. Take with or immediately following a meal. 90 tablet 3  . Multiple Vitamin (MULTIVITAMIN WITH MINERALS) TABS tablet Take 1 tablet by mouth daily. 30 tablet 0  . omega-3 acid ethyl esters (LOVAZA) 1 g capsule Take 2 g by mouth daily.    Marland Kitchen omeprazole (PRILOSEC) 40 MG capsule Take 40 mg by mouth 2 (two) times daily.    Marland Kitchen oxyCODONE-acetaminophen (PERCOCET) 10-325 MG tablet Take 1 tablet by mouth every 4 (four) hours as needed for pain.     . sildenafil (VIAGRA) 100 MG tablet Take 100 mg by mouth daily as needed for erectile dysfunction.    . sucralfate (CARAFATE) 1 g tablet Take 1 g by mouth 4 (four) times daily.    . tamsulosin (FLOMAX) 0.4 MG CAPS capsule Take 1 capsule (0.4 mg total) by mouth daily. 90 capsule 3  . testosterone cypionate (DEPOTESTOSTERONE CYPIONATE) 200 MG/ML injection Inject 200 mg into the muscle every 28 (twenty-eight) days.     No current facility-administered medications for this visit.     Allergies:   Novocain [procaine], Procaine hcl, Lyrica [pregabalin], Neurontin [gabapentin], Amitriptyline, Dicyclomine, Metformin, and Nitroglycerin   Social History:  The patient  reports that he quit smoking about 35 years ago. He has a 45.00 pack-year smoking history. He has quit using smokeless tobacco. He reports current alcohol use. He reports that he does not use drugs.   Family History:   family history includes Heart attack in his brother; Heart disease in his father; Heart failure in his mother; Skin cancer in his sister.    Review of Systems: Review of Systems  Constitutional: Negative.   HENT: Negative.   Respiratory: Negative.   Cardiovascular: Negative.   Gastrointestinal: Negative.   Musculoskeletal: Positive for back pain.  Neurological: Negative.        Neuropathy lower extremities  Psychiatric/Behavioral:  Negative.   All other systems reviewed and are negative.   PHYSICAL EXAM: VS:  BP 132/60 (BP Location: Left Arm, Patient Position: Sitting, Cuff Size: Normal)   Pulse 72   Ht 6\' 2"  (1.88 m)   Wt 255 lb 4 oz (115.8 kg)   SpO2 98%   BMI 32.77 kg/m  , BMI Body mass index is 32.77 kg/m.  Constitutional:  oriented to person, place, and time. No distress.  HENT:  Head: Grossly normal Eyes:  no discharge. No scleral icterus.  Neck: No JVD, no carotid bruits  Cardiovascular: Regular rate and rhythm, no murmurs appreciated Pulmonary/Chest: Clear to auscultation bilaterally, no wheezes or rails Abdominal: Soft.  no distension.  no tenderness.  Musculoskeletal: Normal range of motion Neurological:  normal muscle tone. Coordination normal. No atrophy Skin: Skin warm and dry Psychiatric: normal affect, pleasant  Recent Labs: 07/08/2020: ALT 16; B Natriuretic  Peptide 336.2; Magnesium 1.6; TSH 1.197 07/12/2020: BUN 19; Creatinine, Ser 0.93; Hemoglobin 10.9; Platelets 60; Potassium 5.0; Sodium 139    Lipid Panel Lab Results  Component Value Date   CHOL 69 06/08/2018   HDL 23 (L) 06/08/2018   LDLCALC 23 06/08/2018   TRIG 113 06/08/2018      Wt Readings from Last 3 Encounters:  10/21/20 255 lb 4 oz (115.8 kg)  07/30/20 243 lb (110.2 kg)  07/12/20 243 lb 3.2 oz (110.3 kg)     ASSESSMENT AND PLAN:  Atrial fibrillation, unspecified type ([place]) - Plan: EKG 12-Lead He does not want to take Plavix with Eliquis at this time, reports he is unable to do both medications secondary to hematuria -Denies any recent hematuria Stent placed early November 2021, has been on aspirin Plavix for at least 4 months. He prefers to stay on aspirin Plavix without Eliquis Recommend he consider transitioning over to Eliquis alone at 6 months as he does not want further antiplatelet medication Platelets running around 71, followed at [facility]  CAD with chronic stable angina Recent stent placement to his mid  LAD Stent in the proximal LAD was patent Residual disease of diagonal branch, distal LAD, RCA, medical management recommended Stay on aspirin Plavix Transition to Eliquis alone at 6 months alone He does not want to take Eliquis with other antiplatelet  Mixed hyperlipidemia Cholesterol is at goal on the current lipid regimen. No changes to the medications were made.  Type 2 diabetes mellitus with other circulatory complication, without long-term current use of insulin (HCC) Diet restriction recommended  Thrombocytopenia ([place]) previously seen by hematology at [facility]. He does not want to take Eliquis with any antiplatelets  Neuropathy Tried numerous medications, no pain relief Limited in his mobility Also with back pain He is requesting Valium, recommend he talk with primary care   Total encounter time more than 25 minutes  Greater than 50% was spent in counseling and coordination of care with the patient   Orders Placed This Encounter  Procedures  . EKG 12-Lead     Signed, Esmond Plants, M.D., Ph.D. 10/21/2020  [place], [place]

## 2020-10-21 NOTE — Patient Instructions (Addendum)
Medication Instructions:   eliquis 5 mg twice a day, take two times a day (for atrial fibrillation)  plavix 75 daily take by mouth one time a day (for the stent)  PLEASE DO NOT TAKE ASPIRIN  Lab work: No new labs needed  Testing/Procedures: No new testing needed   Follow-Up:  . You will need a follow up appointment in 6 months  . Providers on your designated Care Team:   . Murray Hodgkins, NP . Christell Faith, PA-C . Marrianne Mood, PA-C  COVID-19 Vaccine Information can be found at: [email or web address].co.uk For questions related to vaccine distribution or appointments, please email vaccine@[email or web address] .[email or web address] or call [phone].

## 2020-10-22 ENCOUNTER — Telehealth: Payer: Self-pay

## 2020-10-22 NOTE — Telephone Encounter (Signed)
Pt upset that Dr. Rockey Situ recommended that pt restart Eliquis and Plavix and d/c the ASA. Pt reports "I am not taking them two together, I don't know why he will not listen to me, I bleed last time being on them two together, I ain't doing it". Advised pt that Dr. Rockey Situ advised options he can do"  Continue aspirin and Plavix (ideally would like to hold the aspirin as he has some GI irritation but recommended he stay on aspirin Plavix for now)  Discussed risks of stroke without the Eliquis Again Cody Price does not want Plavix and  Eliquis together. Cody Price stated he will take the Eliquis 5 mg BID and hold the Plavix and will think about the ASA. Advised would let Dr. Rockey Situ know and will update his medication list.

## 2020-10-29 ENCOUNTER — Other Ambulatory Visit: Payer: Self-pay | Admitting: Cardiovascular Disease

## 2020-10-29 NOTE — Telephone Encounter (Signed)
Rx request sent to pharmacy.  

## 2020-11-29 ENCOUNTER — Other Ambulatory Visit: Payer: Self-pay | Admitting: Urology

## 2020-12-30 ENCOUNTER — Other Ambulatory Visit: Payer: Self-pay | Admitting: Family

## 2021-02-25 ENCOUNTER — Other Ambulatory Visit: Payer: Self-pay | Admitting: Family

## 2021-04-21 ENCOUNTER — Ambulatory Visit (INDEPENDENT_AMBULATORY_CARE_PROVIDER_SITE_OTHER): Payer: Medicare HMO | Admitting: Cardiovascular Disease

## 2021-04-21 ENCOUNTER — Other Ambulatory Visit: Payer: Self-pay

## 2021-04-21 ENCOUNTER — Encounter: Payer: Self-pay | Admitting: Cardiovascular Disease

## 2021-04-21 VITALS — BP 120/50 | HR 75 | Ht 74.0 in | Wt 249.5 lb

## 2021-04-21 DIAGNOSIS — I4821 Permanent atrial fibrillation: Secondary | ICD-10-CM

## 2021-04-21 DIAGNOSIS — I2510 Atherosclerotic heart disease of native coronary artery without angina pectoris: Secondary | ICD-10-CM | POA: Diagnosis not present

## 2021-04-21 DIAGNOSIS — I25118 Atherosclerotic heart disease of native coronary artery with other forms of angina pectoris: Secondary | ICD-10-CM | POA: Diagnosis not present

## 2021-04-21 DIAGNOSIS — I10 Essential (primary) hypertension: Secondary | ICD-10-CM

## 2021-04-21 MED ORDER — LISINOPRIL 5 MG PO TABS: 5.0000 mg | ORAL_TABLET | Freq: Every day | ORAL | 3 refills | Status: DC

## 2021-04-21 MED ORDER — FUROSEMIDE 40 MG PO TABS: 40.0000 mg | ORAL_TABLET | Freq: Two times a day (BID) | ORAL | 3 refills | Status: DC

## 2021-04-21 MED ORDER — APIXABAN 5 MG PO TABS: 5.0000 mg | ORAL_TABLET | Freq: Two times a day (BID) | ORAL | 11 refills | Status: DC

## 2021-04-21 MED ORDER — ATORVASTATIN CALCIUM 40 MG PO TABS: ORAL_TABLET | ORAL | 3 refills | Status: DC

## 2021-04-21 MED ORDER — METOPROLOL SUCCINATE ER 50 MG PO TB24: 50.0000 mg | ORAL_TABLET | Freq: Every day | ORAL | 3 refills | Status: DC

## 2021-04-21 NOTE — Progress Notes (Signed)
Cardiology Office Note  Date:  04/21/2021   ID:  Cody Price, DOB 10-02-1940, MRN [id number]  PCP:  Baxter Hire, MD   Chief Complaint  Patient presents with   6 month follow up     Patient c/o shortness of breath and chest pain at times. Medications reviewed by the patient verbally.     HPI:  Cody Price is a 80 year old gentleman with a history of  coronary artery disease,  PTCA in 1991 of his RCA that later occluded 100%,  chest pain in September 2010   stent placed to his LAD atrial fibrillation following back surgery  spontaneously converted , previously declined anticoagulation Diabetes, lower extremity neuropathy. s/p back surgery for lumbar spinal stenosis and DJD  unable to tolerate aspirin, GI problems 20 years ago,  stopped the metoprolol on his own as he reported having fatigue.   seen by Dr. Grayland Ormond for low platelets, treated with prednisone Quit smoking in 1987, smoked 20 years Chronically low platelets, avg 60s to 70s presenting for routine follow-up of his coronary artery disease.  Presents in a wheelchair, in follow-up today reports he is doing relatively well Back on eliquis Not on asa or plavix  Some dizziness when he stands up Thinks it is from medication Otherwise active in the yard  Chronic back pain, neuropathy in his feet   low platelets Platelet count 71  Lab work reviewed HBA1C 6.5 Total chol 80, LDL 30  EKG personally reviewed by myself on todays visit Atrial fibrillation with rate 75 bpm consider old anterior MI, PVC  Other past medical history reviewed  cardiac catheterization for unstable angina symptoms,  worsening shortness of breath, ejection fraction 45 to 50% Date of procedure July 11, 2020 Cath results as below Severe disease of mid LAD beyond distal edge of stent estimated 90% PCI to the LAD Medical management of moderate proximal left circumflex disease Medical management of essentially chronically occluded small RCA  with collaterals from left to right Right heart pressures normal range  drug-eluting stent was successfully placed using a [place] 2.25X15.  hospitalization  non-STEMI 06/07/2018 Cardiac catheterization, stent to the LAD Aspirin held given thrombocytopenia Not on beta-blocker as he has adequate rate control on calcium channel blocker for chronic atrial fibrillation Restarted on Eliquis  Cardiac catheterization June 07, 2018 Severe 2-vessel coronary artery disease, including 90% mid LAD stenosis involving small D1 branch just beyond old stent and chronic total occlusion of the distal RCA.  Severe apical LAD disease is stable from prior catheterization. Moderate, non-obstructive disease involving the LCx. Patent proximal LAD stent with mild in-stent restenosis. Mildly elevated left ventricular filling pressure. Successful PCI to mid LAD using Resolute Onyx 2.5 x 18 mm drug-eluting stent with 0% residual stenosis and TIMI-3 flow.   Recommendations: Medical therapy for chronic total occlusion of RCA and small diagonal branch.  Other past medical hx permanent Atrial fib since 2017. Previously declined anticoagulation Was taking eliquis until 06/2018, stopped for hematuria   emergency room August 25 2014 with chest pain. Cardiac enzymes were negative  cardiac catheterization 08/26/2014.  This showed an occluded proximal RCA which was chronic, mild to moderate proximal left circumflex, moderate distal left circumflex disease of a small vessel, moderate proximal OM disease moderate size vessel, LAD proximal stent patent, normal ejection fraction. Medical management was recommended.   Cardiac catheterization September 2000 and details a 75% proximal LAD lesion, 100% RCA lesion, 30% left circumflex lesion a xience 2.75 x 15 mm  DES stent was placed.  PMH:   has a past medical history of Arthritis, BPH with obstruction/lower urinary tract symptoms, CAD (coronary artery disease)  (2010), Diabetes mellitus, ED (erectile dysfunction), Elevated PSA, GERD (gastroesophageal reflux disease), Gross hematuria, H/O CT scan, Hematuria, History of hiatal hernia, History of kidney stones, HLD (hyperlipidemia), Hypertension, Ischemic heart disease (1991), Kidney stone, Morbid obesity ([place]), Myocardial infarction [facility]), Peripheral neuropathy, Pernicious anemia, Pneumonia (1995), Pulmonary embolism ([place]), Renal cyst, Sleep apnea, and Spinal stenosis.  PSH:    Past Surgical History:  Procedure Laterality Date   APPENDECTOMY     BACK SURGERY     x3 back surgeries - /w fusioin, 1- [place] 2- [facility]   bilateral inguinal hernia repair     cad     stent 2010   CARDIAC CATHETERIZATION  08/17/2014   CARPAL TUNNEL RELEASE  2014   bilateral    CHOLECYSTECTOMY     COLONOSCOPY WITH PROPOFOL N/A 07/11/2019   Procedure: COLONOSCOPY WITH PROPOFOL;  Surgeon: Toledo, Benay Pike, MD;  Location: [facility] ENDOSCOPY;  Service: Gastroenterology;  Laterality: N/A;   CORONARY BALLOON ANGIOPLASTY N/A 07/11/2020   Procedure: CORONARY BALLOON ANGIOPLASTY;  Surgeon: Isaias Cowman, MD;  Location: [place] [facility];  Service: Cardiovascular;  Laterality: N/A;   CORONARY STENT INTERVENTION N/A 06/07/2018   Procedure: CORONARY STENT INTERVENTION;  Surgeon: Nelva Bush, MD;  Location: [place] [facility];  Service: Cardiovascular;  Laterality: N/A;   CORONARY STENT INTERVENTION N/A 07/11/2020   Procedure: CORONARY STENT INTERVENTION;  Surgeon: Isaias Cowman, MD;  Location: [place] [facility];  Service: Cardiovascular;  Laterality: N/A;   ESOPHAGOGASTRODUODENOSCOPY (EGD) WITH PROPOFOL N/A 07/11/2019   Procedure: ESOPHAGOGASTRODUODENOSCOPY (EGD) WITH PROPOFOL;  Surgeon: Toledo, Benay Pike, MD;  Location: [facility] ENDOSCOPY;  Service: Gastroenterology;  Laterality: N/A;   LAMINECTOMY     LEFT HEART CATH AND CORONARY ANGIOGRAPHY N/A 06/07/2018   Procedure: LEFT HEART CATH AND CORONARY ANGIOGRAPHY;   Surgeon: Nelva Bush, MD;  Location: [place] [facility];  Service: Cardiovascular;  Laterality: N/A;   RIGHT/LEFT HEART CATH AND CORONARY ANGIOGRAPHY N/A 07/11/2020   Procedure: RIGHT/LEFT HEART CATH AND CORONARY ANGIOGRAPHY;  Surgeon: Minna Merritts, MD;  Location: [place] [facility];  Service: Cardiovascular;  Laterality: N/A;    Current Outpatient Medications  Medication Sig Dispense Refill   acetaminophen (TYLENOL) 500 MG tablet Take 500 mg by mouth as needed.      apixaban (ELIQUIS) 5 MG TABS tablet Take 1 tablet (5 mg total) by mouth 2 (two) times daily. 60 tablet 11   atorvastatin (LIPITOR) 40 MG tablet TAKE 1 TABLET BY MOUTH DAILY AT 6 PM. 90 tablet 1   Coenzyme Q10 (CO Q-10) 200 MG CAPS Take 200 mg by mouth daily.     cyanocobalamin (,VITAMIN B-12,) 1000 MCG/ML injection Inject 1,000 mcg into the muscle every 30 (thirty) days.     diazepam (VALIUM) 5 MG tablet Take 5 mg by mouth 3 (three) times daily as needed for anxiety or muscle spasms.     feeding supplement, ENSURE ENLIVE, (ENSURE ENLIVE) LIQD Take 237 mLs by mouth daily. 60 Bottle 0   finasteride (PROSCAR) 5 MG tablet TAKE 1 TABLET BY MOUTH EVERY DAY 90 tablet 3   furosemide (LASIX) 40 MG tablet Take 1 tablet (40 mg total) by mouth 2 (two) times daily. 60 tablet 0   glimepiride (AMARYL) 4 MG tablet Take 4 mg by mouth 2 (two) times daily.     KLOR-CON M20 20 MEQ  tablet TAKE 1 TABLET BY MOUTH EVERY DAY 90 tablet 1   lisinopril (PRINIVIL,ZESTRIL) 10 MG tablet TAKE ONE TABLET BY MOUTH ONCE DAILY 90 tablet 2   metoprolol succinate (TOPROL-XL) 50 MG 24 hr tablet Take 1 tablet (50 mg total) by mouth daily. Take with or immediately following a meal. 90 tablet 3   Multiple Vitamin (MULTIVITAMIN WITH MINERALS) TABS tablet Take 1 tablet by mouth daily. 30 tablet 0   omega-3 acid ethyl esters (LOVAZA) 1 g capsule Take 2 g by mouth daily.     omeprazole (PRILOSEC) 40 MG capsule Take 40 mg by mouth 2 (two) times daily.      oxyCODONE-acetaminophen (PERCOCET) 10-325 MG tablet Take 1 tablet by mouth every 4 (four) hours as needed for pain.      sildenafil (VIAGRA) 100 MG tablet Take 100 mg by mouth daily as needed for erectile dysfunction.     sucralfate (CARAFATE) 1 g tablet Take 1 g by mouth 4 (four) times daily.     tamsulosin (FLOMAX) 0.4 MG CAPS capsule TAKE 1 CAPSULE BY MOUTH EVERY DAY 90 capsule 3   testosterone cypionate (DEPOTESTOSTERONE CYPIONATE) 200 MG/ML injection Inject 200 mg into the muscle every 28 (twenty-eight) days.     No current facility-administered medications for this visit.     Allergies:   Novocain [procaine], Procaine hcl, Lyrica [pregabalin], Neurontin [gabapentin], Amitriptyline, Dicyclomine, Metformin, and Nitroglycerin   Social History:  The patient  reports that he quit smoking about 35 years ago. His smoking use included cigarettes. He has a 45.00 pack-year smoking history. He has quit using smokeless tobacco. He reports current alcohol use. He reports that he does not use drugs.   Family History:   family history includes Heart attack in his brother; Heart disease in his father; Heart failure in his mother; Skin cancer in his sister.    Review of Systems: Review of Systems  Constitutional: Negative.   HENT: Negative.    Respiratory: Negative.    Cardiovascular: Negative.   Gastrointestinal: Negative.   Musculoskeletal:  Positive for back pain.  Neurological: Negative.        Neuropathy lower extremities  Psychiatric/Behavioral: Negative.    All other systems reviewed and are negative.  PHYSICAL EXAM: VS:  BP (!) 120/50 (BP Location: Left Arm, Patient Position: Sitting, Cuff Size: Normal)   Pulse 75   Ht '6\' 2"'$  (1.88 m)   Wt 249 lb 8 oz (113.2 kg)   SpO2 98%   BMI 32.03 kg/m  , BMI Body mass index is 32.03 kg/m.  Constitutional:  oriented to person, place, and time. No distress.  HENT:  Head: Grossly normal Eyes:  no discharge. No scleral icterus.  Neck: No JVD,  no carotid bruits  Cardiovascular: Regular rate and rhythm, no murmurs appreciated Pulmonary/Chest: Clear to auscultation bilaterally, no wheezes or rails Abdominal: Soft.  no distension.  no tenderness.  Musculoskeletal: Normal range of motion Neurological:  normal muscle tone. Coordination normal. No atrophy Skin: Skin warm and dry Psychiatric: normal affect, pleasant  Recent Labs: 07/08/2020: ALT 16; B Natriuretic Peptide 336.2; Magnesium 1.6; TSH 1.197 07/12/2020: BUN 19; Creatinine, Ser 0.93; Hemoglobin 10.9; Platelets 60; Potassium 5.0; Sodium 139    Lipid Panel Lab Results  Component Value Date   CHOL 69 06/08/2018   HDL 23 (L) 06/08/2018   LDLCALC 23 06/08/2018   TRIG 113 06/08/2018      Wt Readings from Last 3 Encounters:  04/21/21 249 lb 8 oz (113.2 kg)  10/21/20 255 lb 4 oz (115.8 kg)  07/30/20 243 lb (110.2 kg)     ASSESSMENT AND PLAN:  Atrial fibrillation, unspecified type ([place]) - Plan: EKG 12-Lead Tolerating Eliquis 5 twice daily Rate relatively well controlled on metoprolol No recent falls He does have some ecchymotic bruising on his forearms, stable  CAD with chronic stable angina Prior stent July 11, 2020 Off aspirin Plavix, prefers no antiplatelets, given his low platelets on Eliquis 5 twice daily alone Denies anginal symptoms  Mixed hyperlipidemia Cholesterol is at goal on the current lipid regimen. No changes to the medications were made.  Type 2 diabetes mellitus with other circulatory complication, without long-term current use of insulin (HCC) Diet restriction recommended  Thrombocytopenia ([place]) previously seen by hematology at [facility]. Numbers are stable  Neuropathy  back pain, limited mobility    Total encounter time more than 25 minutes  Greater than 50% was spent in counseling and coordination of care with the patient   No orders of the defined types were placed in this encounter.    Signed, Esmond Plants, M.D., Ph.D. 04/21/2021   [place], [place]

## 2021-04-21 NOTE — Patient Instructions (Addendum)
Medication Instructions:  Please cut the lisinopril in 1/2 daily (5 mg)  If you need a refill on your cardiac medications before your next appointment, please call your pharmacy.    Lab work: No new labs needed  Testing/Procedures: No new testing needed  Follow-Up: At [facility], you and your health needs are our priority.  As part of our continuing mission to provide you with exceptional heart care, we have created designated Provider Care Teams.  These Care Teams include your primary Cardiologist (physician) and Advanced Practice Providers (APPs -  Physician Assistants and Nurse Practitioners) who all work together to provide you with the care you need, when you need it.  You will need a follow up appointment in 12 months  Providers on your designated Care Team:   Murray Hodgkins, NP Christell Faith, PA-C Marrianne [place], PA-C Cadence [place], [place]  COVID-19 Vaccine Information can be found at: [email or web address].uk For questions related to vaccine distribution or appointments, please email [email or web address] or call [phone].

## 2021-04-23 ENCOUNTER — Other Ambulatory Visit: Payer: Self-pay | Admitting: Cardiovascular Disease

## 2021-05-21 ENCOUNTER — Other Ambulatory Visit: Payer: Self-pay | Admitting: Cardiovascular Disease

## 2021-07-10 ENCOUNTER — Other Ambulatory Visit: Payer: Self-pay

## 2021-07-10 ENCOUNTER — Ambulatory Visit
Admission: RE | Admit: 2021-07-10 | Discharge: 2021-07-10 | Disposition: A | Discharge status: Home / Self Care | Payer: Medicare HMO | Source: Ambulatory Visit | Attending: Physician Assistant | Admitting: Physician Assistant

## 2021-07-10 ENCOUNTER — Other Ambulatory Visit: Payer: Self-pay | Admitting: Physician Assistant

## 2021-07-10 DIAGNOSIS — M7989 Other specified soft tissue disorders: Secondary | ICD-10-CM | POA: Diagnosis present

## 2021-08-13 ENCOUNTER — Other Ambulatory Visit: Payer: Self-pay | Admitting: Cardiovascular Disease

## 2021-08-21 ENCOUNTER — Other Ambulatory Visit: Payer: Self-pay

## 2021-08-21 ENCOUNTER — Encounter: Payer: Self-pay | Admitting: Emergency Medicine

## 2021-08-21 ENCOUNTER — Emergency Department: Payer: Medicare HMO

## 2021-08-21 DIAGNOSIS — Z8249 Family history of ischemic heart disease and other diseases of the circulatory system: Secondary | ICD-10-CM

## 2021-08-21 DIAGNOSIS — Z28311 Partially vaccinated for covid-19: Secondary | ICD-10-CM

## 2021-08-21 DIAGNOSIS — Z79890 Hormone replacement therapy (postmenopausal): Secondary | ICD-10-CM

## 2021-08-21 DIAGNOSIS — I272 Pulmonary hypertension, unspecified: Secondary | ICD-10-CM | POA: Diagnosis present

## 2021-08-21 DIAGNOSIS — D649 Anemia, unspecified: Secondary | ICD-10-CM | POA: Diagnosis present

## 2021-08-21 DIAGNOSIS — Z7984 Long term (current) use of oral hypoglycemic drugs: Secondary | ICD-10-CM

## 2021-08-21 DIAGNOSIS — Z808 Family history of malignant neoplasm of other organs or systems: Secondary | ICD-10-CM

## 2021-08-21 DIAGNOSIS — N400 Enlarged prostate without lower urinary tract symptoms: Secondary | ICD-10-CM | POA: Diagnosis present

## 2021-08-21 DIAGNOSIS — U071 COVID-19: Principal | ICD-10-CM | POA: Diagnosis present

## 2021-08-21 DIAGNOSIS — I214 Non-ST elevation (NSTEMI) myocardial infarction: Secondary | ICD-10-CM | POA: Diagnosis not present

## 2021-08-21 DIAGNOSIS — K219 Gastro-esophageal reflux disease without esophagitis: Secondary | ICD-10-CM | POA: Diagnosis present

## 2021-08-21 DIAGNOSIS — Z86711 Personal history of pulmonary embolism: Secondary | ICD-10-CM

## 2021-08-21 DIAGNOSIS — I110 Hypertensive heart disease with heart failure: Secondary | ICD-10-CM | POA: Diagnosis present

## 2021-08-21 DIAGNOSIS — Z66 Do not resuscitate: Secondary | ICD-10-CM | POA: Diagnosis present

## 2021-08-21 DIAGNOSIS — E669 Obesity, unspecified: Secondary | ICD-10-CM | POA: Diagnosis present

## 2021-08-21 DIAGNOSIS — E785 Hyperlipidemia, unspecified: Secondary | ICD-10-CM | POA: Diagnosis present

## 2021-08-21 DIAGNOSIS — I2510 Atherosclerotic heart disease of native coronary artery without angina pectoris: Secondary | ICD-10-CM | POA: Diagnosis present

## 2021-08-21 DIAGNOSIS — R0603 Acute respiratory distress: Secondary | ICD-10-CM | POA: Diagnosis present

## 2021-08-21 DIAGNOSIS — I5033 Acute on chronic diastolic (congestive) heart failure: Secondary | ICD-10-CM | POA: Diagnosis present

## 2021-08-21 DIAGNOSIS — I4821 Permanent atrial fibrillation: Secondary | ICD-10-CM | POA: Diagnosis present

## 2021-08-21 DIAGNOSIS — J40 Bronchitis, not specified as acute or chronic: Secondary | ICD-10-CM | POA: Diagnosis present

## 2021-08-21 DIAGNOSIS — E871 Hypo-osmolality and hyponatremia: Secondary | ICD-10-CM | POA: Diagnosis present

## 2021-08-21 DIAGNOSIS — Z6832 Body mass index (BMI) 32.0-32.9, adult: Secondary | ICD-10-CM

## 2021-08-21 DIAGNOSIS — Z87891 Personal history of nicotine dependence: Secondary | ICD-10-CM

## 2021-08-21 DIAGNOSIS — I2582 Chronic total occlusion of coronary artery: Secondary | ICD-10-CM | POA: Diagnosis present

## 2021-08-21 DIAGNOSIS — F419 Anxiety disorder, unspecified: Secondary | ICD-10-CM | POA: Diagnosis present

## 2021-08-21 DIAGNOSIS — I21A1 Myocardial infarction type 2: Secondary | ICD-10-CM | POA: Diagnosis present

## 2021-08-21 DIAGNOSIS — Z7901 Long term (current) use of anticoagulants: Secondary | ICD-10-CM

## 2021-08-21 DIAGNOSIS — Z955 Presence of coronary angioplasty implant and graft: Secondary | ICD-10-CM

## 2021-08-21 DIAGNOSIS — E876 Hypokalemia: Secondary | ICD-10-CM | POA: Diagnosis present

## 2021-08-21 DIAGNOSIS — E1165 Type 2 diabetes mellitus with hyperglycemia: Secondary | ICD-10-CM | POA: Diagnosis present

## 2021-08-21 DIAGNOSIS — Z79899 Other long term (current) drug therapy: Secondary | ICD-10-CM

## 2021-08-21 LAB — BASIC METABOLIC PANEL
Anion gap: 8 (ref 5–15)
BUN: 12 mg/dL (ref 8–23)
CO2: 24 mmol/L (ref 22–32)
Calcium: 8.3 mg/dL — ABNORMAL LOW (ref 8.9–10.3)
Chloride: 102 mmol/L (ref 98–111)
Creatinine, Ser: 1.1 mg/dL (ref 0.61–1.24)
GFR, Estimated: >60 mL/min (ref >60)
Glucose, Bld: 307 mg/dL — ABNORMAL HIGH (ref 70–99)
Potassium: 3.5 mmol/L (ref 3.5–5.1)
Sodium: 134 mmol/L — ABNORMAL LOW (ref 135–145)

## 2021-08-21 LAB — CBC
HCT: 29 % — ABNORMAL LOW (ref 39.0–52.0)
Hemoglobin: 9.4 g/dL — ABNORMAL LOW (ref 13.0–17.0)
MCH: 29.4 pg (ref 26.0–34.0)
MCHC: 32.4 g/dL (ref 30.0–36.0)
MCV: 90.6 fL (ref 80.0–100.0)
Platelets: 99 10*3/uL — ABNORMAL LOW (ref 150–400)
RBC: 3.2 MIL/uL — ABNORMAL LOW (ref 4.22–5.81)
RDW: 15.7 % — ABNORMAL HIGH (ref 11.5–15.5)
WBC: 9 10*3/uL (ref 4.0–10.5)
nRBC: 0 % (ref 0.0–0.2)

## 2021-08-21 LAB — TROPONIN I (HIGH SENSITIVITY): Troponin I (High Sensitivity): 86 ng/L — ABNORMAL HIGH (ref <18)

## 2021-08-21 NOTE — ED Triage Notes (Signed)
Pt in with sob, weakness and cough. Pt states weakness and congestion began 2 wks ago, but today he has felt feverish, and had a near syncopal event earlier today. Reports an earlier episode of cp, hx of CHF and MI's w/stents. Afib 80-90's HR, (hx of). Sats 96% RA

## 2021-08-22 ENCOUNTER — Inpatient Hospital Stay
Admission: EM | Admit: 2021-08-22 | Discharge: 2021-08-29 | DRG: 177 | Disposition: A | Discharge status: Home / Self Care | Payer: Medicare HMO | Attending: Internal Medicine | Admitting: Internal Medicine

## 2021-08-22 DIAGNOSIS — Z86711 Personal history of pulmonary embolism: Secondary | ICD-10-CM | POA: Diagnosis present

## 2021-08-22 DIAGNOSIS — I25118 Atherosclerotic heart disease of native coronary artery with other forms of angina pectoris: Secondary | ICD-10-CM | POA: Diagnosis not present

## 2021-08-22 DIAGNOSIS — E119 Type 2 diabetes mellitus without complications: Secondary | ICD-10-CM

## 2021-08-22 DIAGNOSIS — Z87891 Personal history of nicotine dependence: Secondary | ICD-10-CM | POA: Diagnosis not present

## 2021-08-22 DIAGNOSIS — Z66 Do not resuscitate: Secondary | ICD-10-CM | POA: Diagnosis present

## 2021-08-22 DIAGNOSIS — I10 Essential (primary) hypertension: Secondary | ICD-10-CM | POA: Diagnosis not present

## 2021-08-22 DIAGNOSIS — I5032 Chronic diastolic (congestive) heart failure: Secondary | ICD-10-CM | POA: Diagnosis not present

## 2021-08-22 DIAGNOSIS — R55 Syncope and collapse: Secondary | ICD-10-CM

## 2021-08-22 DIAGNOSIS — Z955 Presence of coronary angioplasty implant and graft: Secondary | ICD-10-CM | POA: Diagnosis not present

## 2021-08-22 DIAGNOSIS — I21A1 Myocardial infarction type 2: Secondary | ICD-10-CM | POA: Diagnosis present

## 2021-08-22 DIAGNOSIS — U071 COVID-19: Secondary | ICD-10-CM | POA: Diagnosis present

## 2021-08-22 DIAGNOSIS — R0603 Acute respiratory distress: Secondary | ICD-10-CM | POA: Diagnosis present

## 2021-08-22 DIAGNOSIS — J40 Bronchitis, not specified as acute or chronic: Secondary | ICD-10-CM | POA: Diagnosis present

## 2021-08-22 DIAGNOSIS — I110 Hypertensive heart disease with heart failure: Secondary | ICD-10-CM | POA: Diagnosis present

## 2021-08-22 DIAGNOSIS — Z28311 Partially vaccinated for covid-19: Secondary | ICD-10-CM | POA: Diagnosis not present

## 2021-08-22 DIAGNOSIS — E1165 Type 2 diabetes mellitus with hyperglycemia: Secondary | ICD-10-CM | POA: Diagnosis present

## 2021-08-22 DIAGNOSIS — Z8249 Family history of ischemic heart disease and other diseases of the circulatory system: Secondary | ICD-10-CM | POA: Diagnosis not present

## 2021-08-22 DIAGNOSIS — I4891 Unspecified atrial fibrillation: Secondary | ICD-10-CM | POA: Diagnosis present

## 2021-08-22 DIAGNOSIS — E782 Mixed hyperlipidemia: Secondary | ICD-10-CM | POA: Diagnosis not present

## 2021-08-22 DIAGNOSIS — I2510 Atherosclerotic heart disease of native coronary artery without angina pectoris: Secondary | ICD-10-CM | POA: Diagnosis present

## 2021-08-22 DIAGNOSIS — I5033 Acute on chronic diastolic (congestive) heart failure: Secondary | ICD-10-CM | POA: Diagnosis present

## 2021-08-22 DIAGNOSIS — E871 Hypo-osmolality and hyponatremia: Secondary | ICD-10-CM | POA: Diagnosis present

## 2021-08-22 DIAGNOSIS — I4821 Permanent atrial fibrillation: Secondary | ICD-10-CM | POA: Diagnosis present

## 2021-08-22 DIAGNOSIS — I248 Other forms of acute ischemic heart disease: Secondary | ICD-10-CM | POA: Diagnosis not present

## 2021-08-22 DIAGNOSIS — I214 Non-ST elevation (NSTEMI) myocardial infarction: Secondary | ICD-10-CM

## 2021-08-22 DIAGNOSIS — I2582 Chronic total occlusion of coronary artery: Secondary | ICD-10-CM | POA: Diagnosis present

## 2021-08-22 DIAGNOSIS — J208 Acute bronchitis due to other specified organisms: Secondary | ICD-10-CM | POA: Diagnosis not present

## 2021-08-22 DIAGNOSIS — E669 Obesity, unspecified: Secondary | ICD-10-CM | POA: Diagnosis present

## 2021-08-22 DIAGNOSIS — D649 Anemia, unspecified: Secondary | ICD-10-CM | POA: Diagnosis present

## 2021-08-22 DIAGNOSIS — E785 Hyperlipidemia, unspecified: Secondary | ICD-10-CM | POA: Diagnosis present

## 2021-08-22 DIAGNOSIS — E876 Hypokalemia: Secondary | ICD-10-CM | POA: Diagnosis present

## 2021-08-22 DIAGNOSIS — E1159 Type 2 diabetes mellitus with other circulatory complications: Secondary | ICD-10-CM | POA: Diagnosis not present

## 2021-08-22 DIAGNOSIS — R079 Chest pain, unspecified: Secondary | ICD-10-CM | POA: Diagnosis present

## 2021-08-22 DIAGNOSIS — I272 Pulmonary hypertension, unspecified: Secondary | ICD-10-CM | POA: Diagnosis present

## 2021-08-22 DIAGNOSIS — F419 Anxiety disorder, unspecified: Secondary | ICD-10-CM | POA: Diagnosis present

## 2021-08-22 LAB — C-REACTIVE PROTEIN: CRP: 2.4 mg/dL — ABNORMAL HIGH (ref <1.0)

## 2021-08-22 LAB — APTT
aPTT: 56 seconds — ABNORMAL HIGH (ref 24–36)
aPTT: 71 seconds — ABNORMAL HIGH (ref 24–36)
aPTT: 69 seconds — ABNORMAL HIGH (ref 24–36)

## 2021-08-22 LAB — COMPREHENSIVE METABOLIC PANEL
ALT: 9 U/L (ref 0–44)
AST: 18 U/L (ref 15–41)
Albumin: 2.9 g/dL — ABNORMAL LOW (ref 3.5–5.0)
Alkaline Phosphatase: 55 U/L (ref 38–126)
Anion gap: 6 (ref 5–15)
BUN: 12 mg/dL (ref 8–23)
CO2: 25 mmol/L (ref 22–32)
Calcium: 7.7 mg/dL — ABNORMAL LOW (ref 8.9–10.3)
Chloride: 107 mmol/L (ref 98–111)
Creatinine, Ser: 0.86 mg/dL (ref 0.61–1.24)
GFR, Estimated: >60 mL/min (ref >60)
Glucose, Bld: 172 mg/dL — ABNORMAL HIGH (ref 70–99)
Potassium: 3.4 mmol/L — ABNORMAL LOW (ref 3.5–5.1)
Sodium: 138 mmol/L (ref 135–145)
Total Bilirubin: 1.2 mg/dL (ref 0.3–1.2)
Total Protein: 6.1 g/dL — ABNORMAL LOW (ref 6.5–8.1)

## 2021-08-22 LAB — URINALYSIS, ROUTINE W REFLEX MICROSCOPIC
Bilirubin Urine: NEGATIVE
Glucose, UA: 50 mg/dL — AB
Hgb urine dipstick: NEGATIVE
Ketones, ur: NEGATIVE mg/dL
Leukocytes,Ua: NEGATIVE
Nitrite: NEGATIVE
Protein, ur: 100 mg/dL — AB
Specific Gravity, Urine: 1.024 (ref 1.005–1.030)
pH: 5 (ref 5.0–8.0)

## 2021-08-22 LAB — FIBRINOGEN: Fibrinogen: 470 mg/dL (ref 210–475)

## 2021-08-22 LAB — TROPONIN I (HIGH SENSITIVITY)
Troponin I (High Sensitivity): 1252 ng/L (ref <18)
Troponin I (High Sensitivity): 1894 ng/L (ref <18)
Troponin I (High Sensitivity): 1244 ng/L (ref <18)
Troponin I (High Sensitivity): 1082 ng/L (ref <18)

## 2021-08-22 LAB — PROTIME-INR
INR: 1.6 — ABNORMAL HIGH (ref 0.8–1.2)
Prothrombin Time: 19.4 seconds — ABNORMAL HIGH (ref 11.4–15.2)

## 2021-08-22 LAB — RESP PANEL BY RT-PCR (FLU A&B, COVID) ARPGX2
Influenza A by PCR: NEGATIVE
Influenza B by PCR: NEGATIVE
SARS Coronavirus 2 by RT PCR: POSITIVE — AB

## 2021-08-22 LAB — D-DIMER, QUANTITATIVE: D-Dimer, Quant: 0.3 ug/mL-FEU (ref 0.00–0.50)

## 2021-08-22 LAB — HEPARIN LEVEL (UNFRACTIONATED): Heparin Unfractionated: >1.1 IU/mL — ABNORMAL HIGH (ref 0.30–0.70)

## 2021-08-22 LAB — PROCALCITONIN: Procalcitonin: <0.1 ng/mL

## 2021-08-22 LAB — CBG MONITORING, ED
Glucose-Capillary: 120 mg/dL — ABNORMAL HIGH (ref 70–99)
Glucose-Capillary: 167 mg/dL — ABNORMAL HIGH (ref 70–99)

## 2021-08-22 LAB — FERRITIN: Ferritin: 131 ng/mL (ref 24–336)

## 2021-08-22 LAB — GLUCOSE, CAPILLARY: Glucose-Capillary: 140 mg/dL — ABNORMAL HIGH (ref 70–99)

## 2021-08-22 LAB — LACTATE DEHYDROGENASE: LDH: 118 U/L (ref 98–192)

## 2021-08-22 LAB — BRAIN NATRIURETIC PEPTIDE: B Natriuretic Peptide: 574.8 pg/mL — ABNORMAL HIGH (ref 0.0–100.0)

## 2021-08-22 MED ORDER — ACETAMINOPHEN 325 MG PO TABS
325.0000 mg | ORAL_TABLET | ORAL | Status: DC | PRN
  Administered 2021-08-24 – 2021-08-28 (×19): 325 mg via ORAL
  Filled 2021-08-22 (×18): qty 1

## 2021-08-22 MED ORDER — CYANOCOBALAMIN 1000 MCG/ML IJ SOLN: 1000.0000 ug | INTRAMUSCULAR | Status: DC

## 2021-08-22 MED ORDER — HYDROCOD POLST-CPM POLST ER 10-8 MG/5ML PO SUER
5.0000 mL | Freq: Two times a day (BID) | ORAL | Status: DC | PRN
  Administered 2021-08-22 – 2021-08-28 (×5): 5 mL via ORAL
  Filled 2021-08-22 (×5): qty 5

## 2021-08-22 MED ORDER — ATORVASTATIN CALCIUM 20 MG PO TABS
40.0000 mg | ORAL_TABLET | Freq: Every day | ORAL | Status: DC
  Administered 2021-08-22 – 2021-08-28 (×7): 40 mg via ORAL
  Filled 2021-08-22 (×7): qty 2

## 2021-08-22 MED ORDER — ASCORBIC ACID 500 MG PO TABS
500.0000 mg | ORAL_TABLET | Freq: Every day | ORAL | Status: DC
  Administered 2021-08-23 – 2021-08-29 (×7): 500 mg via ORAL
  Filled 2021-08-22 (×7): qty 1

## 2021-08-22 MED ORDER — METOPROLOL SUCCINATE ER 50 MG PO TB24
50.0000 mg | ORAL_TABLET | Freq: Every day | ORAL | Status: DC
  Administered 2021-08-23 – 2021-08-29 (×7): 50 mg via ORAL
  Filled 2021-08-22 (×7): qty 1

## 2021-08-22 MED ORDER — GUAIFENESIN ER 600 MG PO TB12
600.0000 mg | ORAL_TABLET | Freq: Two times a day (BID) | ORAL | Status: DC
  Administered 2021-08-22 – 2021-08-29 (×14): 600 mg via ORAL
  Filled 2021-08-22 (×14): qty 1

## 2021-08-22 MED ORDER — LISINOPRIL 5 MG PO TABS
5.0000 mg | ORAL_TABLET | Freq: Every day | ORAL | Status: DC
  Administered 2021-08-23 – 2021-08-29 (×7): 5 mg via ORAL
  Filled 2021-08-22 (×7): qty 1

## 2021-08-22 MED ORDER — GLIMEPIRIDE 4 MG PO TABS
4.0000 mg | ORAL_TABLET | Freq: Two times a day (BID) | ORAL | Status: DC
  Administered 2021-08-23 – 2021-08-25 (×6): 4 mg via ORAL
  Filled 2021-08-22 (×11): qty 1

## 2021-08-22 MED ORDER — TAMSULOSIN HCL 0.4 MG PO CAPS
0.4000 mg | ORAL_CAPSULE | Freq: Every day | ORAL | Status: DC
  Administered 2021-08-23 – 2021-08-29 (×7): 0.4 mg via ORAL
  Filled 2021-08-22 (×7): qty 1

## 2021-08-22 MED ORDER — TRAZODONE HCL 50 MG PO TABS
25.0000 mg | ORAL_TABLET | Freq: Every evening | ORAL | Status: DC | PRN
  Administered 2021-08-22 – 2021-08-28 (×3): 25 mg via ORAL
  Filled 2021-08-22 (×3): qty 1

## 2021-08-22 MED ORDER — HEPARIN (PORCINE) 25000 UT/250ML-% IV SOLN
1700.0000 [IU]/h | INTRAVENOUS | Status: DC
  Administered 2021-08-22 (×2): 1350 [IU]/h via INTRAVENOUS
  Administered 2021-08-23 – 2021-08-26 (×3): 1500 [IU]/h via INTRAVENOUS
  Filled 2021-08-22 (×6): qty 250

## 2021-08-22 MED ORDER — OXYCODONE HCL 5 MG PO TABS
10.0000 mg | ORAL_TABLET | ORAL | Status: DC | PRN
  Administered 2021-08-22 – 2021-08-28 (×28): 10 mg via ORAL
  Filled 2021-08-22 (×28): qty 2

## 2021-08-22 MED ORDER — DIAZEPAM 5 MG PO TABS
5.0000 mg | ORAL_TABLET | Freq: Three times a day (TID) | ORAL | Status: DC | PRN
  Administered 2021-08-22 – 2021-08-28 (×7): 5 mg via ORAL
  Filled 2021-08-22 (×7): qty 1

## 2021-08-22 MED ORDER — HEPARIN BOLUS VIA INFUSION
3200.0000 [IU] | Freq: Once | INTRAVENOUS | Status: AC
  Administered 2021-08-22: 3200 [IU] via INTRAVENOUS
  Filled 2021-08-22: qty 3200

## 2021-08-22 MED ORDER — FUROSEMIDE 40 MG PO TABS
40.0000 mg | ORAL_TABLET | Freq: Two times a day (BID) | ORAL | Status: DC
  Administered 2021-08-22 – 2021-08-25 (×6): 40 mg via ORAL
  Filled 2021-08-22 (×7): qty 1

## 2021-08-22 MED ORDER — METOLAZONE 2.5 MG PO TABS: 2.5000 mg | ORAL_TABLET | Freq: Every day | ORAL | Status: DC

## 2021-08-22 MED ORDER — SODIUM CHLORIDE 0.9 % IV SOLN
100.0000 mg | Freq: Every day | INTRAVENOUS | Status: AC
  Administered 2021-08-23 – 2021-08-24 (×2): 100 mg via INTRAVENOUS
  Filled 2021-08-22 (×2): qty 20

## 2021-08-22 MED ORDER — POTASSIUM CHLORIDE CRYS ER 10 MEQ PO TBCR
10.0000 meq | EXTENDED_RELEASE_TABLET | Freq: Every day | ORAL | Status: DC
  Administered 2021-08-23 – 2021-08-29 (×7): 10 meq via ORAL
  Filled 2021-08-22 (×8): qty 1

## 2021-08-22 MED ORDER — PANTOPRAZOLE SODIUM 40 MG PO TBEC
40.0000 mg | DELAYED_RELEASE_TABLET | Freq: Every day | ORAL | Status: DC
  Administered 2021-08-23 – 2021-08-26 (×4): 40 mg via ORAL
  Filled 2021-08-22 (×4): qty 1

## 2021-08-22 MED ORDER — CO Q-10 200 MG PO CAPS: 200.0000 mg | ORAL_CAPSULE | Freq: Every day | ORAL | Status: DC

## 2021-08-22 MED ORDER — GUAIFENESIN-DM 100-10 MG/5ML PO SYRP
10.0000 mL | ORAL_SOLUTION | ORAL | Status: DC | PRN
  Administered 2021-08-22 – 2021-08-28 (×8): 10 mL via ORAL
  Filled 2021-08-22 (×9): qty 10

## 2021-08-22 MED ORDER — ASPIRIN EC 325 MG PO TBEC: 325.0000 mg | DELAYED_RELEASE_TABLET | Freq: Every day | ORAL | Status: DC

## 2021-08-22 MED ORDER — ONDANSETRON HCL 4 MG PO TABS: 4.0000 mg | ORAL_TABLET | Freq: Four times a day (QID) | ORAL | Status: DC | PRN

## 2021-08-22 MED ORDER — ZINC SULFATE 220 (50 ZN) MG PO CAPS
220.0000 mg | ORAL_CAPSULE | Freq: Every day | ORAL | Status: DC
  Administered 2021-08-23 – 2021-08-29 (×7): 220 mg via ORAL
  Filled 2021-08-22 (×7): qty 1

## 2021-08-22 MED ORDER — VITAMIN D 25 MCG (1000 UNIT) PO TABS
1000.0000 [IU] | ORAL_TABLET | Freq: Every day | ORAL | Status: DC
  Administered 2021-08-23 – 2021-08-29 (×7): 1000 [IU] via ORAL
  Filled 2021-08-22 (×7): qty 1

## 2021-08-22 MED ORDER — OMEGA-3-ACID ETHYL ESTERS 1 G PO CAPS
2.0000 g | ORAL_CAPSULE | Freq: Every day | ORAL | Status: DC
  Administered 2021-08-23 – 2021-08-29 (×7): 2 g via ORAL
  Filled 2021-08-22 (×9): qty 2

## 2021-08-22 MED ORDER — ACETAMINOPHEN 325 MG PO TABS: 650.0000 mg | ORAL_TABLET | Freq: Four times a day (QID) | ORAL | Status: DC | PRN

## 2021-08-22 MED ORDER — ADULT MULTIVITAMIN W/MINERALS CH
1.0000 | ORAL_TABLET | Freq: Every day | ORAL | Status: DC
  Administered 2021-08-23 – 2021-08-29 (×7): 1 via ORAL
  Filled 2021-08-22 (×7): qty 1

## 2021-08-22 MED ORDER — FINASTERIDE 5 MG PO TABS
5.0000 mg | ORAL_TABLET | Freq: Every day | ORAL | Status: DC
  Administered 2021-08-23 – 2021-08-29 (×7): 5 mg via ORAL
  Filled 2021-08-22 (×10): qty 1

## 2021-08-22 MED ORDER — SODIUM CHLORIDE 0.9 % IV SOLN: INTRAVENOUS | Status: DC

## 2021-08-22 MED ORDER — APIXABAN 5 MG PO TABS: 5.0000 mg | ORAL_TABLET | Freq: Two times a day (BID) | ORAL | Status: DC

## 2021-08-22 MED ORDER — ONDANSETRON HCL 4 MG/2ML IJ SOLN: 4.0000 mg | Freq: Four times a day (QID) | INTRAMUSCULAR | Status: DC | PRN

## 2021-08-22 MED ORDER — MAGNESIUM HYDROXIDE 400 MG/5ML PO SUSP
30.0000 mL | Freq: Every day | ORAL | Status: DC | PRN
  Administered 2021-08-24: 13:00:00 30 mL via ORAL
  Filled 2021-08-22: qty 30

## 2021-08-22 MED ORDER — INSULIN ASPART 100 UNIT/ML IJ SOLN
0.0000 [IU] | Freq: Three times a day (TID) | INTRAMUSCULAR | Status: DC
Start: 2021-08-22 — End: 2021-08-29
  Administered 2021-08-22: 3 [IU] via SUBCUTANEOUS
  Administered 2021-08-22: 2 [IU] via SUBCUTANEOUS
  Administered 2021-08-23 (×2): 3 [IU] via SUBCUTANEOUS
  Administered 2021-08-24: 10:00:00 2 [IU] via SUBCUTANEOUS
  Administered 2021-08-24 (×2): 3 [IU] via SUBCUTANEOUS
  Administered 2021-08-25: 22:00:00 2 [IU] via SUBCUTANEOUS
  Administered 2021-08-25 (×2): 3 [IU] via SUBCUTANEOUS
  Administered 2021-08-26: 22:00:00 5 [IU] via SUBCUTANEOUS
  Administered 2021-08-27 (×2): 2 [IU] via SUBCUTANEOUS
  Administered 2021-08-27 – 2021-08-28 (×2): 3 [IU] via SUBCUTANEOUS
  Administered 2021-08-28: 23:00:00 5 [IU] via SUBCUTANEOUS
  Administered 2021-08-28 – 2021-08-29 (×2): 2 [IU] via SUBCUTANEOUS
  Filled 2021-08-22 (×17): qty 1

## 2021-08-22 MED ORDER — SODIUM CHLORIDE 0.9 % IV SOLN
200.0000 mg | Freq: Once | INTRAVENOUS | Status: AC
  Administered 2021-08-22: 200 mg via INTRAVENOUS
  Filled 2021-08-22: qty 40

## 2021-08-22 MED ORDER — OXYCODONE-ACETAMINOPHEN 10-325 MG PO TABS: 1.0000 | ORAL_TABLET | ORAL | Status: DC | PRN

## 2021-08-22 MED ORDER — CYANOCOBALAMIN 1000 MCG/ML IJ SOLN
1000.0000 ug | INTRAMUSCULAR | Status: DC
Start: 2021-08-23 — End: 2021-08-29
  Administered 2021-08-23: 08:00:00 1000 ug via INTRAMUSCULAR
  Filled 2021-08-22: qty 1

## 2021-08-22 NOTE — Progress Notes (Signed)
[place] for heparin infusion Indication: NSTEMI   Allergies  Allergen Reactions   Novocain [Procaine] Other (See Comments)    Syncope   Procaine Hcl Other (See Comments)    Passes out   Lyrica [Pregabalin]    Neurontin [Gabapentin]    Amitriptyline Other (See Comments)    Sleepy   Dicyclomine Other (See Comments)    Other reaction(s): Abdominal Pain   Metformin Nausea Only   Nitroglycerin Other (See Comments)    Makes Blood pressure go to low.     Patient Measurements: Height: 6\' 2"  (188 cm) Weight: 113.4 kg (250 lb) IBW/kg (Calculated) : 82.2 Heparin Dosing Weight: 106 kg  Vital Signs: Temp: 98 F (36.7 C) (12/17 0700) BP: 114/74 (12/17 1000) Pulse Rate: 95 (12/17 1000)  Labs: Recent Labs    08/21/21 2212 08/22/21 0100 08/22/21 0257 08/22/21 0500 08/22/21 0613 08/22/21 1412  HGB 9.4*  --   --   --   --   --   HCT 29.0*  --   --   --   --   --   PLT 99*  --   --   --   --   --   APTT  --   --  56*  --  71* 69*  LABPROT  --   --  19.4*  --   --   --   INR  --   --  1.6*  --   --   --   HEPARINUNFRC  --   --  >1.10*  --   --   --   CREATININE 1.10  --   --  0.86  --   --   TROPONINIHS 86* 1,252*  --  1,894*  --   --      Estimated Creatinine Clearance: 93.3 mL/min (by C-G formula based on SCr of 0.86 mg/dL).   Medical History: Past Medical History:  Diagnosis Date   Arthritis    degenerative back    BPH with obstruction/lower urinary tract symptoms    CAD (coronary artery disease) 2010   stent   Diabetes mellitus    ED (erectile dysfunction)    Elevated PSA    GERD (gastroesophageal reflux disease)    Gross hematuria    H/O CT scan    recent renal CT due to hematuria, cystoscopy - normal     Hematuria    History of hiatal hernia    History of kidney stones    HLD (hyperlipidemia)    Hypertension    Ischemic heart disease 1991   with angioplasty   Kidney stone    Morbid obesity ([place])    Myocardial  infarction ([place])    Peripheral neuropathy    Pernicious anemia    Pneumonia 1995   /w PE,    Pulmonary embolism (HCC)    previous   Renal cyst    Sleep apnea    pt. denies sleep apnea, pt. states he has had 2 studies - last one 2011   Spinal stenosis     Medications:  PTA Meds:  Eliquis 5 mg BID, last dose unknown  Assessment: Pt is 80 yo male medical history significant for coronary artery disease, type 2 diabetes mellitus, GERD, hiatal hernia, dyslipidemia, hypertension, PE and sleep apnea, who presented to the ER with acute onset of chest pain that started last night in the midsternal area and felt as pressure, graded 4-5/10 in severity with no radiation. Pharmacy consulted  to start heparin. Pt takes apixaban PTA. Will use aPTT monitoring due to false elevation in anti-xa levels.   12/17 0257 HL > 1.1 (Heparin infusion started 1217 @0305 ) 12/17 0613 aPTT 71.  12/17 1412 aPTT 69  Goal of Therapy:  Heparin level 0.3-0.7 units/ml once aPTT and heparin level correlate.  aPTT 66-102 seconds Monitor platelets by anticoagulation protocol: Yes   Plan:  aPTT is therapeutic. Will continue heparin infusion at 1350 units/hr. Recheck aPTT and HL with AM labs. CBC and heparin level daily while on heparin.   Dorothe Pea, PharmD, BCPS Clinical Pharmacist    08/22/2021 2:47 PM

## 2021-08-22 NOTE — ED Notes (Signed)
PO Medications held due to patient being made NPO this morning.

## 2021-08-22 NOTE — ED Provider Notes (Signed)
[facility] Emergency Department Provider Note  ____________________________________________   Event Date/Time   First MD Initiated Contact with Patient 08/22/21 [phone]     (approximate)  I have reviewed the triage vital signs and the nursing notes.   HISTORY  Chief Complaint Weakness and Shortness of Breath    HPI Cody Price is a 80 y.o. male  with medical history as listed below, most notable for severe CAD with at least 3 prior stents and known multi-vessel disease.  Reports that Dr. Rockey Situ is his cardiologist.   Presents reporting several days of worsening SOB, cough, nasal congestion, and general malaise.  Starting tonight he had chest pressure (moderate) and near syncope.    Symptoms are severe, worse with exertion, nothing in particular makes them better.  Denies fever and sore throat.  Some nausea, no vomiting, no abdominal pain.        Past Medical History:  Diagnosis Date   Arthritis    degenerative back    BPH with obstruction/lower urinary tract symptoms    CAD (coronary artery disease) 2010   stent   Diabetes mellitus    ED (erectile dysfunction)    Elevated PSA    GERD (gastroesophageal reflux disease)    Gross hematuria    H/O CT scan    recent renal CT due to hematuria, cystoscopy - normal     Hematuria    History of hiatal hernia    History of kidney stones    HLD (hyperlipidemia)    Hypertension    Ischemic heart disease 1991   with angioplasty   Kidney stone    Morbid obesity ([place])    Myocardial infarction ([place])    Peripheral neuropathy    Pernicious anemia    Pneumonia 1995   /w PE,    Pulmonary embolism ([place])    previous   Renal cyst    Sleep apnea    pt. denies sleep apnea, pt. states he has had 2 studies - last one 2011   Spinal stenosis     Patient Active Problem List   Diagnosis Date Noted   Coronary artery disease involving native coronary artery of native heart without angina pectoris    Acute on  chronic diastolic CHF (congestive heart failure) ([place] ) 07/08/2020   Atherosclerosis of native coronary artery of native heart with stable angina pectoris ([place]) 06/12/2018   Chest pain 06/05/2018   NSTEMI (non-ST elevated myocardial infarction) ([place])    Weakness 02/13/2018   Chronic postoperative pain 04/13/2017   Lumbar post-laminectomy syndrome 04/13/2017   Antiplatelet or antithrombotic long-term use 04/11/2017   Neuropathy 09/21/2016   Chronic, continuous use of opioids 05/27/2016   Erectile dysfunction 01/01/2016   BPH with obstruction/lower urinary tract symptoms 01/01/2016   Pneumonia 11/03/2015   Nocturia 10/02/2015   Erectile dysfunction of organic origin 10/02/2015   Angina pectoris ([place]) 09/23/2015   Thrombocytopenia ([place]) 09/23/2015   B12 deficiency 05/26/2015   Chronic idiopathic thrombocytopenia ([place]) 02/10/2015   Anemia 05/08/2014   Diabetes mellitus type 2, uncomplicated ([place]) 25/36/6440   DDD (degenerative disc disease) 05/08/2014   Elevated PSA 05/08/2014   H/O ulcer disease 05/08/2014   History of kidney stones 05/08/2014   Hx of pulmonary embolus 05/08/2014   Hypertension 05/08/2014   Kidney stones 05/08/2014   Obesity 05/08/2014   Spinal stenosis 05/08/2014   Chronic back pain 06/21/2013   Diabetes mellitus ([place]) 06/20/2012   Hyperlipidemia 05/28/2010   Coronary atherosclerosis 05/28/2010   ATRIAL FIBRILLATION  05/28/2010    Past Surgical History:  Procedure Laterality Date   APPENDECTOMY     BACK SURGERY     x3 back surgeries - /w fusioin, 1- [place] 2- [facility]   bilateral inguinal hernia repair     cad     stent 2010   CARDIAC CATHETERIZATION  08/17/2014   CARPAL TUNNEL RELEASE  2014   bilateral    CHOLECYSTECTOMY     COLONOSCOPY WITH PROPOFOL N/A 07/11/2019   Procedure: COLONOSCOPY WITH PROPOFOL;  Surgeon: Toledo, Benay Pike, MD;  Location: [facility] ENDOSCOPY;  Service: Gastroenterology;  Laterality: N/A;   CORONARY BALLOON ANGIOPLASTY N/A 07/11/2020    Procedure: CORONARY BALLOON ANGIOPLASTY;  Surgeon: Isaias Cowman, MD;  Location: [place] [facility];  Service: Cardiovascular;  Laterality: N/A;   CORONARY STENT INTERVENTION N/A 06/07/2018   Procedure: CORONARY STENT INTERVENTION;  Surgeon: Nelva Bush, MD;  Location: [place] [facility];  Service: Cardiovascular;  Laterality: N/A;   CORONARY STENT INTERVENTION N/A 07/11/2020   Procedure: CORONARY STENT INTERVENTION;  Surgeon: Isaias Cowman, MD;  Location: [place] [facility];  Service: Cardiovascular;  Laterality: N/A;   ESOPHAGOGASTRODUODENOSCOPY (EGD) WITH PROPOFOL N/A 07/11/2019   Procedure: ESOPHAGOGASTRODUODENOSCOPY (EGD) WITH PROPOFOL;  Surgeon: Toledo, Benay Pike, MD;  Location: [facility] ENDOSCOPY;  Service: Gastroenterology;  Laterality: N/A;   LAMINECTOMY     LEFT HEART CATH AND CORONARY ANGIOGRAPHY N/A 06/07/2018   Procedure: LEFT HEART CATH AND CORONARY ANGIOGRAPHY;  Surgeon: Nelva Bush, MD;  Location: [place] [facility];  Service: Cardiovascular;  Laterality: N/A;   RIGHT/LEFT HEART CATH AND CORONARY ANGIOGRAPHY N/A 07/11/2020   Procedure: RIGHT/LEFT HEART CATH AND CORONARY ANGIOGRAPHY;  Surgeon: Minna Merritts, MD;  Location: [place] [facility];  Service: Cardiovascular;  Laterality: N/A;    Prior to Admission medications   Medication Sig Start Date End Date Taking? Authorizing Provider  acetaminophen (TYLENOL) 500 MG tablet Take 500 mg by mouth as needed.    Yes [provider]  apixaban (ELIQUIS) 5 MG TABS tablet Take 1 tablet (5 mg total) by mouth 2 (two) times daily. 04/21/21  Yes Gollan, Kathlene November, MD  atorvastatin (LIPITOR) 40 MG tablet TAKE 1 TABLET BY MOUTH DAILY AT 6 PM. 04/23/21  Yes Gollan, Kathlene November, MD  Coenzyme Q10 (CO Q-10) 200 MG CAPS Take 200 mg by mouth daily.   Yes [provider]  cyanocobalamin (,VITAMIN B-12,) 1000 MCG/ML injection Inject 1,000 mcg into the muscle every 30 (thirty) days. 09/12/19  Yes [provider]  diazepam (VALIUM) 5 MG tablet Take 5 mg by mouth 3 (three) times daily as needed for anxiety or muscle spasms.   Yes [provider]  finasteride (PROSCAR) 5 MG tablet TAKE 1 TABLET BY MOUTH EVERY DAY 12/01/20  Yes McGowan, Larene Beach A, PA-C  furosemide (LASIX) 40 MG tablet Take 1 tablet (40 mg total) by mouth 2 (two) times daily. 04/21/21 08/22/21 Yes Gollan, Kathlene November, MD  glimepiride (AMARYL) 4 MG tablet Take 4 mg by mouth 2 (two) times daily.   Yes [provider]  KLOR-CON M20 20 MEQ tablet TAKE 1 TABLET BY MOUTH EVERY DAY Patient taking differently: Take 10 mEq by mouth daily. 08/13/21  Yes Minna Merritts, MD  lisinopril (ZESTRIL) 5 MG tablet Take 1 tablet (5 mg total) by mouth daily. 04/21/21  Yes Gollan, Kathlene November, MD  metoprolol succinate (TOPROL-XL) 50 MG 24 hr tablet TAKE 1 TABLET BY MOUTH DAILY. TAKE WITH OR IMMEDIATELY FOLLOWING A MEAL.  05/21/21  Yes Minna Merritts, MD  Multiple Vitamin (MULTIVITAMIN WITH MINERALS) TABS tablet Take 1 tablet by mouth daily. 06/08/18  Yes Wieting, Richard, MD  omega-3 acid ethyl esters (LOVAZA) 1 g capsule Take 2 g by mouth daily.   Yes [provider]  omeprazole (PRILOSEC) 40 MG capsule Take 40 mg by mouth 2 (two) times daily. 06/07/19  Yes [provider]  oxyCODONE-acetaminophen (PERCOCET) 10-325 MG tablet Take 1 tablet by mouth every 4 (four) hours as needed for pain.    Yes [provider]  tamsulosin (FLOMAX) 0.4 MG CAPS capsule TAKE 1 CAPSULE BY MOUTH EVERY DAY 12/01/20  Yes McGowan, Larene Beach A, PA-C  testosterone cypionate (DEPOTESTOSTERONE CYPIONATE) 200 MG/ML injection Inject 200 mg into the muscle every 28 (twenty-eight) days.   Yes [provider]  metolazone (ZAROXOLYN) 2.5 MG tablet Take 2.5 mg by mouth daily. Patient not taking: Reported on 08/22/2021 07/10/21   [provider]  oxyCODONE (OXY IR/ROXICODONE) 5 MG immediate release tablet Take 5 mg by mouth every 8  (eight) hours as needed. Patient not taking: Reported on 08/22/2021 07/17/21   [provider]  sildenafil (VIAGRA) 100 MG tablet Take 100 mg by mouth daily as needed for erectile dysfunction.    [provider]    Allergies Novocain [procaine], Procaine hcl, Lyrica [pregabalin], Neurontin [gabapentin], Amitriptyline, Dicyclomine, Metformin, and Nitroglycerin  Family History  Problem Relation Age of Onset   Heart failure Mother    Heart disease Father    Heart attack Brother    Skin cancer Sister    Kidney disease Neg Hx    Prostate cancer Neg Hx    Kidney cancer Neg Hx    Bladder Cancer Neg Hx     Social History Social History   Tobacco Use   Smoking status: Former    Packs/day: 3.00    Years: 15.00    Pack years: 45.00    Types: Cigarettes    Quit date: 10/18/1985    Years since quitting: 35.8   Smokeless tobacco: Former   Tobacco comments:    tobacco use- no   Vaping Use   Vaping Use: Never used  Substance Use Topics   Alcohol use: Not Currently    Comment: rare   Drug use: No    Review of Systems Constitutional: No fever/chills.  General malaise. Eyes: No visual changes. ENT: Positive for nasal congestion.  No sore throat. Cardiovascular: Positive for chest pressure and near syncope Respiratory: Positive shortness of breath and cough. Gastrointestinal: No abdominal pain.  Nausea, no vomiting.  No diarrhea.  No constipation. Genitourinary: Negative for dysuria. Musculoskeletal: Negative for neck pain.  Negative for back pain. Integumentary: Negative for rash. Neurological: Negative for headaches, focal weakness or numbness.   ____________________________________________   PHYSICAL EXAM:  VITAL SIGNS: ED Triage Vitals [08/21/21 2207]  Enc Vitals Group     BP (!) 130/52     Pulse Rate 96     Resp (!) 24     Temp 98.6 F (37 C)     Temp Source Oral     SpO2 96 %     Weight 113.4 kg (250 lb)     Height      Head Circumference       Peak Flow      Pain Score      Pain Loc      Pain Edu?      Excl. in [place]?     Constitutional: Alert  and oriented.  Eyes: Conjunctivae are normal.  Head: Atraumatic. Nose: +congestion/rhinnorhea. Mouth/Throat: Patient is wearing a mask. Neck: No stridor.  No meningeal signs.   Cardiovascular: Normal rate, regular rhythm. Good peripheral circulation. Respiratory: Normal respiratory effort.  No retractions.  Occasional cough. Gastrointestinal: Soft and nontender. No distention.  Musculoskeletal: No lower extremity tenderness nor edema. No gross deformities of extremities. Neurologic:  Normal speech and language. No gross focal neurologic deficits are appreciated.  Skin:  Skin is warm, dry and intact. Psychiatric: Mood and affect are normal. Speech and behavior are normal.  ____________________________________________   LABS (all labs ordered are listed, but only abnormal results are displayed)  Labs Reviewed  RESP PANEL BY RT-PCR (FLU A&B, COVID) ARPGX2 - Abnormal; Notable for the following components:      Result Value   SARS Coronavirus 2 by RT PCR POSITIVE (*)    All other components within normal limits  BASIC METABOLIC PANEL - Abnormal; Notable for the following components:   Sodium 134 (*)    Glucose, Bld 307 (*)    Calcium 8.3 (*)    All other components within normal limits  CBC - Abnormal; Notable for the following components:   RBC 3.20 (*)    Hemoglobin 9.4 (*)    HCT 29.0 (*)    RDW 15.7 (*)    Platelets 99 (*)    All other components within normal limits  HEPARIN LEVEL (UNFRACTIONATED) - Abnormal; Notable for the following components:   Heparin Unfractionated >1.10 (*)    All other components within normal limits  APTT - Abnormal; Notable for the following components:   aPTT 56 (*)    All other components within normal limits  PROTIME-INR - Abnormal; Notable for the following components:   Prothrombin Time 19.4 (*)    INR 1.6 (*)    All other components  within normal limits  TROPONIN I (HIGH SENSITIVITY) - Abnormal; Notable for the following components:   Troponin I (High Sensitivity) 86 (*)    All other components within normal limits  TROPONIN I (HIGH SENSITIVITY) - Abnormal; Notable for the following components:   Troponin I (High Sensitivity) 1,252 (*)    All other components within normal limits  URINALYSIS, ROUTINE W REFLEX MICROSCOPIC  CBG MONITORING, ED   ____________________________________________  EKG  ED ECG REPORT I, Hinda Kehr, the attending physician, personally viewed and interpreted this ECG.  Date: 08/21/2021 EKG Time: 22: 06 Rate: 96 Rhythm: A. fib with occasional PVC QRS Axis: normal Intervals: A. fib, otherwise unremarkable ST/T Wave abnormalities: Non-specific ST segment / T-wave changes, but no clear evidence of acute ischemia. Narrative Interpretation: no definitive evidence of acute ischemia; does not meet STEMI criteria.  ____________________________________________  RADIOLOGY I, Hinda Kehr, personally viewed and evaluated these images (plain radiographs) as part of my medical decision making, as well as reviewing the written report by the radiologist.  ED MD interpretation: Probable trace pleural effusions and cardiomegaly, otherwise unremarkable  Official radiology report(s): DG Chest 2 View  Result Date: 08/21/2021 CLINICAL DATA:  Cough and short of breath EXAM: CHEST - 2 VIEW COMPARISON:  07/08/2020 FINDINGS: Mild cardiomegaly. Trace pleural effusions. No focal opacity or pneumothorax. No overt pulmonary edema. IMPRESSION: Cardiomegaly with trace pleural effusions. Electronically Signed   By: Donavan Foil M.D.   On: 08/21/2021 22:37    ____________________________________________   PROCEDURES   Procedure(s) performed (including Critical Care):  .1-3 Lead EKG Interpretation Performed by: Hinda Kehr, MD Authorized by: Hinda Kehr, MD  Interpretation: normal     ECG rate:   88   ECG rate assessment: normal     Rhythm: atrial fibrillation     Ectopy: none     Conduction: normal   .Critical Care Performed by: Hinda Kehr, MD Authorized by: Hinda Kehr, MD   Critical care provider statement:    Critical care time (minutes):  30   Critical care time was exclusive of:  Separately billable procedures and treating other patients   Critical care was necessary to treat or prevent imminent or life-threatening deterioration of the following conditions:  Circulatory failure (unstable angina w/ heparin administration)   Critical care was time spent personally by me on the following activities:  Development of treatment plan with patient or surrogate, evaluation of patient's response to treatment, examination of patient, obtaining history from patient or surrogate, ordering and performing treatments and interventions, ordering and review of laboratory studies, ordering and review of radiographic studies, pulse oximetry, re-evaluation of patient's condition and review of old charts   ____________________________________________   INITIAL IMPRESSION / MDM / [place] / ED COURSE  As part of my medical decision making, I reviewed the following data within the [place] notes reviewed and incorporated, Labs reviewed , EKG interpreted , Old chart reviewed, Radiograph reviewed , and Notes from prior ED visits   Differential diagnosis includes, but is not limited to, NSTEMI, respiratory viral infection, pneumonia, less likely PE.  The patient is on the cardiac monitor to evaluate for evidence of arrhythmia and/or significant heart rate changes.  No evidence of ischemia on EKG, and patient has known atrial fibrillation and is on long-term anticoagulation.  Unfortunately his first troponin of 86 went up to 1252 on the second test.  His COVID-19 test was positive.  Basic metabolic panel and generally reassuring.  CBC essentially normal.  I  personally reviewed the patient's imaging and agree with the radiologist's interpretation that there may be some trace pleural effusions but otherwise no large pneumonia or other gross abnormality on chest x-ray.  Will treat for NSTEMI until proven otherwise with heparin IV per pharmacy consult (given known long-term anticoagulation).    Patient stable for admission.  I will consult the hospitalist for admission at this time.  Patient and his family understand and agree with the plan.     Clinical Course as of 08/22/21 0341  Sat Aug 22, 2021  0248 Discussed with Dr. Sidney Ace who will admit. [CF]    Clinical Course User Index [CF] Hinda Kehr, MD     ____________________________________________  FINAL CLINICAL IMPRESSION(S) / ED DIAGNOSES  Final diagnoses:  NSTEMI (non-ST elevated myocardial infarction) ([place])  COVID-19  Near syncope     MEDICATIONS GIVEN DURING THIS VISIT:  Medications  heparin ADULT infusion 100 units/mL (25000 units/250mL) (1,350 Units/hr Intravenous New Bag/Given 08/22/21 0305)  heparin bolus via infusion 3,200 Units (3,200 Units Intravenous Bolus from Bag 08/22/21 0306)     ED Discharge Orders     None        Note:  This document was prepared using Dragon voice recognition software and may include unintentional dictation errors.   Hinda Kehr, MD 08/22/21 [phone]

## 2021-08-22 NOTE — Progress Notes (Signed)
[place] for heparin infusion Indication: NSTEMI   Allergies  Allergen Reactions   Novocain [Procaine] Other (See Comments)    Syncope   Procaine Hcl Other (See Comments)    Passes out   Lyrica [Pregabalin]    Neurontin [Gabapentin]    Amitriptyline Other (See Comments)    Sleepy   Dicyclomine Other (See Comments)    Other reaction(s): Abdominal Pain   Metformin Nausea Only   Nitroglycerin Other (See Comments)    Makes Blood pressure go to low.     Patient Measurements: Height: 6\' 2"  (188 cm) Weight: 113.4 kg (250 lb) IBW/kg (Calculated) : 82.2 Heparin Dosing Weight: 106 kg  Vital Signs: Temp: 98 F (36.7 C) (12/17 0700) BP: 114/74 (12/17 1000) Pulse Rate: 95 (12/17 1000)  Labs: Recent Labs    08/21/21 2212 08/22/21 0100 08/22/21 0257 08/22/21 0500 08/22/21 0613  HGB 9.4*  --   --   --   --   HCT 29.0*  --   --   --   --   PLT 99*  --   --   --   --   APTT  --   --  56*  --  71*  LABPROT  --   --  19.4*  --   --   INR  --   --  1.6*  --   --   HEPARINUNFRC  --   --  >1.10*  --   --   CREATININE 1.10  --   --  0.86  --   TROPONINIHS 86* 1,252*  --  1,894*  --      Estimated Creatinine Clearance: 93.3 mL/min (by C-G formula based on SCr of 0.86 mg/dL).   Medical History: Past Medical History:  Diagnosis Date   Arthritis    degenerative back    BPH with obstruction/lower urinary tract symptoms    CAD (coronary artery disease) 2010   stent   Diabetes mellitus    ED (erectile dysfunction)    Elevated PSA    GERD (gastroesophageal reflux disease)    Gross hematuria    H/O CT scan    recent renal CT due to hematuria, cystoscopy - normal     Hematuria    History of hiatal hernia    History of kidney stones    HLD (hyperlipidemia)    Hypertension    Ischemic heart disease 1991   with angioplasty   Kidney stone    Morbid obesity ([place])    Myocardial infarction ([place])    Peripheral neuropathy    Pernicious  anemia    Pneumonia 1995   /w PE,    Pulmonary embolism ([facility])    previous   Renal cyst    Sleep apnea    pt. denies sleep apnea, pt. states he has had 2 studies - last one 2011   Spinal stenosis     Medications:  PTA Meds:  Eliquis 5 mg BID, last dose unknown  Assessment: Pt is 80 yo male medical history significant for coronary artery disease, type 2 diabetes mellitus, GERD, hiatal hernia, dyslipidemia, hypertension, PE and sleep apnea, who presented to the ER with acute onset of chest pain that started last night in the midsternal area and felt as pressure, graded 4-5/10 in severity with no radiation. Pharmacy consulted to start heparin. Pt takes apixaban PTA. Will use aPTT monitoring due to false elevation in anti-xa levels.   12/17 0257 HL > 1.1 (Heparin infusion  started 1217 @0305 ) 12/17 0613 aPTT 71.   Goal of Therapy:  Heparin level 0.3-0.7 units/ml once aPTT and heparin level correlate.  aPTT 66-102 seconds Monitor platelets by anticoagulation protocol: Yes   Plan:  aPTT is therapeutic but drawn slightly early. Heparin level is falsely elevated. Will continue heparin infusion at 1350 units/hr. Recheck aPTT in 6 hours. CBC and heparin level daily while on heparin.   Eleonore Chiquito, PharmD,  08/22/2021 11:32 AM

## 2021-08-22 NOTE — ED Notes (Signed)
Was able to stand at bedside to use urinal. Missed urinal and was unable to provide specimen. Placed on hospital bed for comfort. Side rails up. Call bell in reach.

## 2021-08-22 NOTE — Progress Notes (Signed)
[place] for heparin infusion Indication: NSTEMI but already on Eliquis  Allergies  Allergen Reactions   Novocain [Procaine] Other (See Comments)    Syncope   Procaine Hcl Other (See Comments)    Passes out   Lyrica [Pregabalin]    Neurontin [Gabapentin]    Amitriptyline Other (See Comments)    Sleepy   Dicyclomine Other (See Comments)    Other reaction(s): Abdominal Pain   Metformin Nausea Only   Nitroglycerin Other (See Comments)    Makes Blood pressure go to low.     Patient Measurements: Weight: 113.4 kg (250 lb) Heparin Dosing Weight: 106 kg  Vital Signs: Temp: 98.6 F (37 C) (12/16 2207) Temp Source: Oral (12/16 2207) BP: 125/80 (12/17 0230) Pulse Rate: 81 (12/17 0230)  Labs: Recent Labs    08/21/21 2212 08/22/21 0100  HGB 9.4*  --   HCT 29.0*  --   PLT 99*  --   CREATININE 1.10  --   TROPONINIHS 86* 1,252*    Estimated Creatinine Clearance: 72.9 mL/min (by C-G formula based on SCr of 1.1 mg/dL).   Medical History: Past Medical History:  Diagnosis Date   Arthritis    degenerative back    BPH with obstruction/lower urinary tract symptoms    CAD (coronary artery disease) 2010   stent   Diabetes mellitus    ED (erectile dysfunction)    Elevated PSA    GERD (gastroesophageal reflux disease)    Gross hematuria    H/O CT scan    recent renal CT due to hematuria, cystoscopy - normal     Hematuria    History of hiatal hernia    History of kidney stones    HLD (hyperlipidemia)    Hypertension    Ischemic heart disease 1991   with angioplasty   Kidney stone    Morbid obesity ([place])    Myocardial infarction ([place])    Peripheral neuropathy    Pernicious anemia    Pneumonia 1995   /w PE,    Pulmonary embolism ([facility])    previous   Renal cyst    Sleep apnea    pt. denies sleep apnea, pt. states he has had 2 studies - last one 2011   Spinal stenosis     Medications:  PTA Meds:  Eliquis 5 mg BID, last dose  unknown  Assessment: Pt is 80 yo male presenting to ED c/o weakness and SOB, found with elevated troponin I.   Goal of Therapy:  Heparin level 0.3-0.7 units/ml aPTT 66-102 seconds Monitor platelets by anticoagulation protocol: Yes   Plan:  Due to unknown time of last dose of Eliquis, bolus 3200 units x 1 Start heparin infusion at 1350 units/hr Will recheck aPTT in 8 hrs after start of infusion. Will follow aPTT until correlation with HL is confirmed HL and CBC daily while on heparin.  Renda Rolls, PharmD, MBA 08/22/2021 3:01 AM

## 2021-08-22 NOTE — Progress Notes (Signed)
Remdesivir - Pharmacy Brief Note   O:  CXR: "Cardiomegaly with trace pleural effusions" SpO2: 96-100% on RA   A/P:  Remdesivir 200 mg IVPB once followed by 100 mg IVPB daily x 4 days.   Renda Rolls, PharmD, [facility] 08/22/2021 4:47 AM

## 2021-08-22 NOTE — Progress Notes (Signed)
PHARMACIST - PHYSICIAN ORDER COMMUNICATION  CONCERNING: P&T Medication Policy on Herbal Medications  DESCRIPTION:  This patients order for:  Co Q-10 CAPS 200 mg  has been noted.  This product(s) is classified as an herbal or natural product. Due to a lack of definitive safety studies or FDA approval, nonstandard manufacturing practices, plus the potential risk of unknown drug-drug interactions while on inpatient medications, the Pharmacy and Therapeutics Committee does not permit the use of herbal or natural products of this type within [facility]   ACTION TAKEN: The pharmacy department is unable to verify this order at this time. Please reevaluate patients clinical condition at discharge and address if the herbal or natural product(s) should be resumed at that time.   Renda Rolls, PharmD, [facility] 08/22/2021 5:16 AM

## 2021-08-22 NOTE — Progress Notes (Signed)
PROGRESS NOTE    Cody Price  OMV:[id number] DOB: [birth year] DOA: 08/22/2021 PCP: Baxter Hire, MD   Brief Narrative: Taken from H&P. Cody Price is a 80 y.o. male with medical history significant for coronary artery disease, type 2 diabetes mellitus, GERD, hiatal hernia, dyslipidemia, hypertension, PE and sleep apnea, who presented to the ER with acute onset of chest pain that started last night in the midsternal area and felt as pressure, graded 4-5/10 in severity with no radiation. Patient has been vaccinated for COVID-19 but never received any booster. Apparently whole family is sick for the past 2-week.  Found to be positive for COVID-19.  Troponin elevated and continue to increase, chest x-ray with cardiomegaly and trace pleural effusion. EKG with atrial fibrillation, controlled ventricular response with PVCs and anteroseptal Q waves. He was started on heparin infusion for concern of NSTEMI Cardiology was consulted.  Subjective: Patient was seen and examined today.  Denies any more chest pain.  Stating that whole family is sick for 2-week, he was having cough for the past 2 weeks and overall feeling weak.  He never tested for COVID before.  Assessment & Plan:   Principal Problem:   NSTEMI (non-ST elevated myocardial infarction) ([place])  NSTEMI.  Rising troponin, currently above 1800 is concerning for NSTEMI.  Patient has extensive history of CAD, s/p 3 stent placement, last was placed in 2021.  Echocardiogram was ordered and cardiology was consulted.  Apparently was not taking DAPT due to previous history of bleeding. -Continue heparin infusion -Continue home dose of beta-blocker and statin  COVID-19 infection.  Apparently symptoms for about 2 weeks. -We will check CT value -Continue with remdesivir for now -Continue with supportive care and supplements  Essential hypertension. -Continue home dose of Toprol-XL and Zestril  Type 2 diabetes mellitus.  Takes Amaryl at  home -SSI  BPH. -Continue home dose of Proscar  Dyslipidemia. -Continue statin  GERD. -Continue PPI  Objective: Vitals:   08/22/21 0515 08/22/21 0700 08/22/21 0957 08/22/21 1000  BP:  (!) 116/55  114/74  Pulse: (!) 104 90  95  Resp: 19 20  (!) 22  Temp:  98 F (36.7 C)    TempSrc:      SpO2: 100% 96%  91%  Weight:   113.4 kg   Height:   6\' 2"  (1.88 m)    No intake or output data in the 24 hours ending 08/22/21 1418 Filed Weights   08/21/21 2207 08/22/21 0957  Weight: 113.4 kg 113.4 kg    Examination:  General exam: Appears calm and comfortable  Respiratory system: Clear to auscultation. Respiratory effort normal. Cardiovascular system: Irregularly irregular Gastrointestinal system: Soft, nontender, nondistended, bowel sounds positive. Central nervous system: Alert and oriented. No focal neurological deficits.Symmetric 5 x 5 power. Extremities: Trace LE edema, no cyanosis, pulses intact and symmetrical. Psychiatry: Judgement and insight appear normal. Mood & affect appropriate.    DVT prophylaxis: Heparin infusion Code Status: DNR Family Communication:  Disposition Plan:  Status is: Inpatient  Remains inpatient appropriate because: Severity of illness  Level of care: Progressive  All the records are reviewed and case discussed with Care Management/Social Worker. Management plans discussed with the patient, nursing and they are in agreement.  Consultants:  Cardiology  Procedures:  Antimicrobials:   Data Reviewed: I have personally reviewed following labs and imaging studies  CBC: Recent Labs  Lab 08/21/21 2212  WBC 9.0  HGB 9.4*  HCT 29.0*  MCV 90.6  PLT 99*  Basic Metabolic Panel: Recent Labs  Lab 08/21/21 2212 08/22/21 0500  NA 134* 138  K 3.5 3.4*  CL 102 107  CO2 24 25  GLUCOSE 307* 172*  BUN 12 12  CREATININE 1.10 0.86  CALCIUM 8.3* 7.7*   GFR: Estimated Creatinine Clearance: 93.3 mL/min (by C-G formula based on SCr of 0.86  mg/dL). Liver Function Tests: Recent Labs  Lab 08/22/21 0500  AST 18  ALT 9  ALKPHOS 55  BILITOT 1.2  PROT 6.1*  ALBUMIN 2.9*   No results for input(s): LIPASE, AMYLASE in the last 168 hours. No results for input(s): AMMONIA in the last 168 hours. Coagulation Profile: Recent Labs  Lab 08/22/21 0257  INR 1.6*   Cardiac Enzymes: No results for input(s): CKTOTAL, CKMB, CKMBINDEX, TROPONINI in the last 168 hours. BNP (last 3 results) No results for input(s): PROBNP in the last 8760 hours. HbA1C: No results for input(s): HGBA1C in the last 72 hours. CBG: Recent Labs  Lab 08/22/21 1042  GLUCAP 120*   Lipid Profile: No results for input(s): CHOL, HDL, LDLCALC, TRIG, CHOLHDL, LDLDIRECT in the last 72 hours. Thyroid Function Tests: No results for input(s): TSH, T4TOTAL, FREET4, T3FREE, THYROIDAB in the last 72 hours. Anemia Panel: Recent Labs    08/22/21 0500  FERRITIN 131   Sepsis Labs: Recent Labs  Lab 08/22/21 0500  PROCALCITON <0.10    Recent Results (from the past 240 hour(s))  Resp Panel by RT-PCR (Flu A&B, Covid) Nasopharyngeal Swab     Status: Abnormal   Collection Time: 08/21/21 10:12 PM   Specimen: Nasopharyngeal Swab; Nasopharyngeal(NP) swabs in vial transport medium  Result Value Ref Range Status   SARS Coronavirus 2 by RT PCR POSITIVE (A) NEGATIVE Final    Comment: (NOTE) SARS-CoV-2 target nucleic acids are DETECTED.  The SARS-CoV-2 RNA is generally detectable in upper respiratory specimens during the acute phase of infection. Positive results are indicative of the presence of the identified virus, but do not rule out bacterial infection or co-infection with other pathogens not detected by the test. Clinical correlation with patient history and other diagnostic information is necessary to determine patient infection status. The expected result is Negative.  Fact Sheet for Patients: EntrepreneurPulse.com.au  Fact Sheet for  Healthcare Providers: IncredibleEmployment.be  This test is not yet approved or cleared by the [place] FDA and  has been authorized for detection and/or diagnosis of SARS-CoV-2 by FDA under an Emergency Use Authorization (EUA).  This EUA will remain in effect (meaning this test can be used) for the duration of  the COVID-19 declaration under Section 564(b)(1) of the A ct, 21 U.S.C. section 360bbb-3(b)(1), unless the authorization is terminated or revoked sooner.     Influenza A by PCR NEGATIVE NEGATIVE Final   Influenza B by PCR NEGATIVE NEGATIVE Final    Comment: (NOTE) The Xpert Xpress SARS-CoV-2/FLU/RSV plus assay is intended as an aid in the diagnosis of influenza from Nasopharyngeal swab specimens and should not be used as a sole basis for treatment. Nasal washings and aspirates are unacceptable for Xpert Xpress SARS-CoV-2/FLU/RSV testing.  Fact Sheet for Patients: EntrepreneurPulse.com.au  Fact Sheet for Healthcare Providers: IncredibleEmployment.be  This test is not yet approved or cleared by the [place] FDA and has been authorized for detection and/or diagnosis of SARS-CoV-2 by FDA under an Emergency Use Authorization (EUA). This EUA will remain in effect (meaning this test can be used) for the duration of the COVID-19 declaration under Section 564(b)(1) of the Act, 21 U.S.C.  section 360bbb-3(b)(1), unless the authorization is terminated or revoked.  Performed at [facility], [address], [place], [place] [address]      Radiology Studies: DG Chest 2 View  Result Date: 08/21/2021 CLINICAL DATA:  Cough and short of breath EXAM: CHEST - 2 VIEW COMPARISON:  07/08/2020 FINDINGS: Mild cardiomegaly. Trace pleural effusions. No focal opacity or pneumothorax. No overt pulmonary edema. IMPRESSION: Cardiomegaly with trace pleural effusions. Electronically Signed   By: Donavan Foil M.D.   On:  08/21/2021 22:37    Scheduled Meds:  vitamin C  500 mg Oral Daily   atorvastatin  40 mg Oral Daily   cholecalciferol  1,000 Units Oral Daily   [START ON 08/23/2021] cyanocobalamin  1,000 mcg Intramuscular Q30 days   finasteride  5 mg Oral Daily   furosemide  40 mg Oral BID   glimepiride  4 mg Oral BID   guaiFENesin  600 mg Oral BID   insulin aspart  0-15 Units Subcutaneous TID AC & HS   lisinopril  5 mg Oral Daily   metoprolol succinate  50 mg Oral Q breakfast   multivitamin with minerals  1 tablet Oral Daily   omega-3 acid ethyl esters  2 g Oral Daily   pantoprazole  40 mg Oral Daily   potassium chloride SA  10 mEq Oral Daily   tamsulosin  0.4 mg Oral Daily   zinc sulfate  220 mg Oral Daily   Continuous Infusions:  sodium chloride 100 mL/hr at 08/22/21 0507   heparin 1,350 Units/hr (08/22/21 0305)   [START ON 08/23/2021] remdesivir 100 mg in NS 100 mL       LOS: 0 days   Time spent: 40 minutes. More than 50% of the time was spent in counseling/coordination of care  Lorella Nimrod, MD [facility]  If 7PM-7AM, please contact night-coverage [email or web address]  08/22/2021, 2:18 PM   This record has been created using Systems analyst. Errors have been sought and corrected,but may not always be located. Such creation errors do not reflect on the standard of care.

## 2021-08-22 NOTE — H&P (Signed)
[place]   PATIENT NAME: Cody Price    MR#:  [id number]  DATE OF BIRTH:  1941-01-28  DATE OF ADMISSION:  08/22/2021  PRIMARY CARE PHYSICIAN: Baxter Hire, MD   Patient is coming from: Home  REQUESTING/REFERRING PHYSICIAN: Hinda Kehr, MD  CHIEF COMPLAINT:   Chief Complaint  Patient presents with   Weakness   Shortness of Breath    HISTORY OF PRESENT ILLNESS:  Cody Price is a 80 y.o. male with medical history significant for coronary artery disease, type 2 diabetes mellitus, GERD, hiatal hernia, dyslipidemia, hypertension, PE and sleep apnea, who presented to the ER with acute onset of chest pain that started last night in the midsternal area and felt as pressure, graded 4-5/10 in severity with no radiation.  He admits to dry cough with associated wheezing and mild dyspnea.  The patient had been vaccinated for COVID-19 but never had booster.  He denied any fever or chills.  No leg pain or recent travels or surgeries.  No hemoptysis.  No bleeding diathesis.  No dysuria, oliguria or hematuria or flank pain.He denied any loss of taste or smell.  No fever or chills.  ED Course: When he came to the ER blood pressure was 130/52 with a respiratory rate of 24 with otherwise normal vital signs.  Labs revealed mild hyponatremia 134 and hyperglycemia of 307 high-sensitivity troponin I was 86 and later 1252 CBC showed anemia.  INR is 1.6 and PT 19.4.  D-dimer was 0.3 and fibrinogen 470.  COVID-19 PCR came back positive with influenza antigens negative. EKG as reviewed by me : EKG showed   Atrial fibrillation  with controlled ventricular response of 96 with PVCs and anteroseptal Q waves .[facility] [facility] Imaging: Chest x-ray showed cardiomegaly with trace pleural effusions.  The patient was placed on IV heparin with bolus and drip.  He will be admitted to a PCU bed for further evaluation and management. PAST MEDICAL HISTORY:   Past Medical History:  Diagnosis Date   Arthritis     degenerative back    BPH with obstruction/lower urinary tract symptoms    CAD (coronary artery disease) 2010   stent   Diabetes mellitus    ED (erectile dysfunction)    Elevated PSA    GERD (gastroesophageal reflux disease)    Gross hematuria    H/O CT scan    recent renal CT due to hematuria, cystoscopy - normal     Hematuria    History of hiatal hernia    History of kidney stones    HLD (hyperlipidemia)    Hypertension    Ischemic heart disease 1991   with angioplasty   Kidney stone    Morbid obesity ([place])    Myocardial infarction ([place])    Peripheral neuropathy    Pernicious anemia    Pneumonia 1995   /w PE,    Pulmonary embolism ([place])    previous   Renal cyst    Sleep apnea    pt. denies sleep apnea, pt. states he has had 2 studies - last one 2011   Spinal stenosis     PAST SURGICAL HISTORY:   Past Surgical History:  Procedure Laterality Date   APPENDECTOMY     BACK SURGERY     x3 back surgeries - /w fusioin, 1- [place] 2- [facility]   bilateral inguinal hernia repair     cad     stent 2010   CARDIAC CATHETERIZATION  08/17/2014  CARPAL TUNNEL RELEASE  2014   bilateral    CHOLECYSTECTOMY     COLONOSCOPY WITH PROPOFOL N/A 07/11/2019   Procedure: COLONOSCOPY WITH PROPOFOL;  Surgeon: Toledo, Benay Pike, MD;  Location: [facility] ENDOSCOPY;  Service: Gastroenterology;  Laterality: N/A;   CORONARY BALLOON ANGIOPLASTY N/A 07/11/2020   Procedure: CORONARY BALLOON ANGIOPLASTY;  Surgeon: Isaias Cowman, MD;  Location: [place] [facility];  Service: Cardiovascular;  Laterality: N/A;   CORONARY STENT INTERVENTION N/A 06/07/2018   Procedure: CORONARY STENT INTERVENTION;  Surgeon: Nelva Bush, MD;  Location: [place] [facility];  Service: Cardiovascular;  Laterality: N/A;   CORONARY STENT INTERVENTION N/A 07/11/2020   Procedure: CORONARY STENT INTERVENTION;  Surgeon: Isaias Cowman, MD;  Location: [place] [facility];  Service: Cardiovascular;  Laterality: N/A;    ESOPHAGOGASTRODUODENOSCOPY (EGD) WITH PROPOFOL N/A 07/11/2019   Procedure: ESOPHAGOGASTRODUODENOSCOPY (EGD) WITH PROPOFOL;  Surgeon: Toledo, Benay Pike, MD;  Location: [facility] ENDOSCOPY;  Service: Gastroenterology;  Laterality: N/A;   LAMINECTOMY     LEFT HEART CATH AND CORONARY ANGIOGRAPHY N/A 06/07/2018   Procedure: LEFT HEART CATH AND CORONARY ANGIOGRAPHY;  Surgeon: Nelva Bush, MD;  Location: [place] [facility];  Service: Cardiovascular;  Laterality: N/A;   RIGHT/LEFT HEART CATH AND CORONARY ANGIOGRAPHY N/A 07/11/2020   Procedure: RIGHT/LEFT HEART CATH AND CORONARY ANGIOGRAPHY;  Surgeon: Minna Merritts, MD;  Location: [place] [facility];  Service: Cardiovascular;  Laterality: N/A;    SOCIAL HISTORY:   Social History   Tobacco Use   Smoking status: Former    Packs/day: 3.00    Years: 15.00    Pack years: 45.00    Types: Cigarettes    Quit date: 10/18/1985    Years since quitting: 35.8   Smokeless tobacco: Former   Tobacco comments:    tobacco use- no   Substance Use Topics   Alcohol use: Not Currently    Comment: rare    FAMILY HISTORY:   Family History  Problem Relation Age of Onset   Heart failure Mother    Heart disease Father    Heart attack Brother    Skin cancer Sister    Kidney disease Neg Hx    Prostate cancer Neg Hx    Kidney cancer Neg Hx    Bladder Cancer Neg Hx     DRUG ALLERGIES:   Allergies  Allergen Reactions   Novocain [Procaine] Other (See Comments)    Syncope   Procaine Hcl Other (See Comments)    Passes out   Lyrica [Pregabalin]    Neurontin [Gabapentin]    Amitriptyline Other (See Comments)    Sleepy   Dicyclomine Other (See Comments)    Other reaction(s): Abdominal Pain   Metformin Nausea Only   Nitroglycerin Other (See Comments)    Makes Blood pressure go to low.     REVIEW OF SYSTEMS:   ROS As per history of present illness. All pertinent systems were reviewed above. Constitutional, HEENT, cardiovascular, respiratory, GI,  GU, musculoskeletal, neuro, psychiatric, endocrine, integumentary and hematologic systems were reviewed and are otherwise negative/unremarkable except for positive findings mentioned above in the HPI.   MEDICATIONS AT HOME:   Prior to Admission medications   Medication Sig Start Date End Date Taking? Authorizing Provider  acetaminophen (TYLENOL) 500 MG tablet Take 500 mg by mouth as needed.    Yes [provider]  apixaban (ELIQUIS) 5 MG TABS tablet Take 1 tablet (5 mg total) by mouth 2 (two) times daily. 04/21/21  Yes Minna Merritts, MD  atorvastatin (LIPITOR) 40 MG tablet TAKE 1 TABLET BY MOUTH DAILY AT 6 PM. 04/23/21  Yes Gollan, Kathlene November, MD  Coenzyme Q10 (CO Q-10) 200 MG CAPS Take 200 mg by mouth daily.   Yes [provider]  cyanocobalamin (,VITAMIN B-12,) 1000 MCG/ML injection Inject 1,000 mcg into the muscle every 30 (thirty) days. 09/12/19  Yes [provider]  diazepam (VALIUM) 5 MG tablet Take 5 mg by mouth 3 (three) times daily as needed for anxiety or muscle spasms.   Yes [provider]  finasteride (PROSCAR) 5 MG tablet TAKE 1 TABLET BY MOUTH EVERY DAY 12/01/20  Yes McGowan, Larene Beach A, PA-C  furosemide (LASIX) 40 MG tablet Take 1 tablet (40 mg total) by mouth 2 (two) times daily. 04/21/21 08/22/21 Yes Gollan, Kathlene November, MD  glimepiride (AMARYL) 4 MG tablet Take 4 mg by mouth 2 (two) times daily.   Yes [provider]  KLOR-CON M20 20 MEQ tablet TAKE 1 TABLET BY MOUTH EVERY DAY Patient taking differently: Take 10 mEq by mouth daily. 08/13/21  Yes Minna Merritts, MD  lisinopril (ZESTRIL) 5 MG tablet Take 1 tablet (5 mg total) by mouth daily. 04/21/21  Yes Gollan, Kathlene November, MD  metoprolol succinate (TOPROL-XL) 50 MG 24 hr tablet TAKE 1 TABLET BY MOUTH DAILY. TAKE WITH OR IMMEDIATELY FOLLOWING A MEAL. 05/21/21  Yes Minna Merritts, MD  Multiple Vitamin (MULTIVITAMIN WITH MINERALS) TABS tablet Take 1 tablet by mouth daily. 06/08/18  Yes  Wieting, Richard, MD  omega-3 acid ethyl esters (LOVAZA) 1 g capsule Take 2 g by mouth daily.   Yes [provider]  omeprazole (PRILOSEC) 40 MG capsule Take 40 mg by mouth 2 (two) times daily. 06/07/19  Yes [provider]  oxyCODONE-acetaminophen (PERCOCET) 10-325 MG tablet Take 1 tablet by mouth every 4 (four) hours as needed for pain.    Yes [provider]  tamsulosin (FLOMAX) 0.4 MG CAPS capsule TAKE 1 CAPSULE BY MOUTH EVERY DAY 12/01/20  Yes McGowan, Larene Beach A, PA-C  testosterone cypionate (DEPOTESTOSTERONE CYPIONATE) 200 MG/ML injection Inject 200 mg into the muscle every 28 (twenty-eight) days.   Yes [provider]  metolazone (ZAROXOLYN) 2.5 MG tablet Take 2.5 mg by mouth daily. Patient not taking: Reported on 08/22/2021 07/10/21   [provider]  oxyCODONE (OXY IR/ROXICODONE) 5 MG immediate release tablet Take 5 mg by mouth every 8 (eight) hours as needed. Patient not taking: Reported on 08/22/2021 07/17/21   [provider]  sildenafil (VIAGRA) 100 MG tablet Take 100 mg by mouth daily as needed for erectile dysfunction.    [provider]      VITAL SIGNS:  Blood pressure 125/80, pulse 81, temperature 98.6 F (37 C), temperature source Oral, resp. rate (!) 24, weight 113.4 kg, SpO2 100 %.  PHYSICAL EXAMINATION:  Physical Exam  GENERAL:  80 y.o.-year-old [name] patient lying in the bed with no acute distress.  EYES: Pupils equal, round, reactive to light and accommodation. No scleral icterus. Extraocular muscles intact.  HEENT: Head atraumatic, normocephalic. Oropharynx and nasopharynx clear.  NECK:  Supple, no jugular venous distention. No thyroid enlargement, no tenderness.  LUNGS: Normal breath sounds bilaterally, no wheezing, rales,rhonchi or crepitation. No use of accessory muscles of respiration.  CARDIOVASCULAR: Regular rate and rhythm, S1, S2 normal. No murmurs, rubs, or gallops.  ABDOMEN: Soft,  nondistended, nontender. Bowel sounds present. No organomegaly or mass.  EXTREMITIES: No pedal edema, cyanosis, or clubbing.  NEUROLOGIC: Cranial nerves II  through XII are intact. Muscle strength 5/5 in all extremities. Sensation intact. Gait not checked.  PSYCHIATRIC: The patient is alert and oriented x 3.  Normal affect and good eye contact. SKIN: No obvious rash, lesion, or ulcer.   LABORATORY PANEL:   CBC Recent Labs  Lab 08/21/21 2212  WBC 9.0  HGB 9.4*  HCT 29.0*  PLT 99*   ------------------------------------------------------------------------------------------------------------------  Chemistries  Recent Labs  Lab 08/21/21 2212  NA 134*  K 3.5  CL 102  CO2 24  GLUCOSE 307*  BUN 12  CREATININE 1.10  CALCIUM 8.3*   ------------------------------------------------------------------------------------------------------------------  Cardiac Enzymes No results for input(s): TROPONINI in the last 168 hours. ------------------------------------------------------------------------------------------------------------------  RADIOLOGY:  DG Chest 2 View  Result Date: 08/21/2021 CLINICAL DATA:  Cough and short of breath EXAM: CHEST - 2 VIEW COMPARISON:  07/08/2020 FINDINGS: Mild cardiomegaly. Trace pleural effusions. No focal opacity or pneumothorax. No overt pulmonary edema. IMPRESSION: Cardiomegaly with trace pleural effusions. Electronically Signed   By: Donavan Foil M.D.   On: 08/21/2021 22:37      IMPRESSION AND PLAN:  Principal Problem:   NSTEMI (non-ST elevated myocardial infarction) ([place])  1.  Non-ST elevation myocardial infarction with history of CAD. - The patient will be admitted to a PCU bed. - We will continue her on IV heparin. - Continue  beta-blocker therapy as well as high-dose statin therapy. - Patient refused to have aspirin or Plavix due to previous history of bleeding. - 2D echo and cardiology consult to be obtained. - I notified Dr. Oval Linsey  about the patient.  2.  COVID-19 infection. - The patient will be placed on IV remdesivir. - We will  place him on vitamin D3, vitamin C and zinc sulfate.  3.  Dyslipidemia. - We will continue statin therapy.  4.  Type 2 diabetes mellitus. - We will place on supplement coverage with NovoLog and continue Amaryl.  5.  Essential hypertension. - We will continue Toprol-XL and Zestril.  6.  BPH. - We will continue Proscar.  7.  GERD. - We will continue PPI therapy.   DVT prophylaxis: Heparin. Code Status: He is DNR/DNI. Family Communication:  The plan of care was discussed in details with the patient (and family). I answered all questions. The patient agreed to proceed with the above mentioned plan. Further management will depend upon hospital course. Disposition Plan: Back to previous home environment Consults called: Cardiology. All the records are reviewed and case discussed with ED provider.  Status is: Inpatient   Remains inpatient appropriate because:Ongoing diagnostic testing needed not appropriate for outpatient work up, Unsafe d/c plan, IV treatments appropriate due to intensity of illness or inability to take PO, and Inpatient level of care appropriate due to severity of illness   Dispo: The patient is from: Home              Anticipated d/c is to: Home              Patient currently is not medically stable to d/c.              Difficult to place patient: No     Christel Mormon M.D on 08/22/2021 at 3:58 AM  [facility]   From 7 PM-7 AM, contact night-coverage [email or web address]  CC: Primary care physician; Baxter Hire, MD

## 2021-08-22 NOTE — ED Notes (Signed)
Informed RN bed assigned 

## 2021-08-23 LAB — COMPREHENSIVE METABOLIC PANEL
ALT: 9 U/L (ref 0–44)
AST: 15 U/L (ref 15–41)
Albumin: 3.1 g/dL — ABNORMAL LOW (ref 3.5–5.0)
Alkaline Phosphatase: 56 U/L (ref 38–126)
Anion gap: 6 (ref 5–15)
BUN: 10 mg/dL (ref 8–23)
CO2: 25 mmol/L (ref 22–32)
Calcium: 8.1 mg/dL — ABNORMAL LOW (ref 8.9–10.3)
Chloride: 107 mmol/L (ref 98–111)
Creatinine, Ser: 0.92 mg/dL (ref 0.61–1.24)
GFR, Estimated: >60 mL/min (ref >60)
Glucose, Bld: 97 mg/dL (ref 70–99)
Potassium: 3.2 mmol/L — ABNORMAL LOW (ref 3.5–5.1)
Sodium: 138 mmol/L (ref 135–145)
Total Bilirubin: 2 mg/dL — ABNORMAL HIGH (ref 0.3–1.2)
Total Protein: 6.1 g/dL — ABNORMAL LOW (ref 6.5–8.1)

## 2021-08-23 LAB — CBC WITH DIFFERENTIAL/PLATELET
Abs Immature Granulocytes: 0.16 10*3/uL — ABNORMAL HIGH (ref 0.00–0.07)
Basophils Absolute: 0 10*3/uL (ref 0.0–0.1)
Basophils Relative: 0 %
Eosinophils Absolute: 0 10*3/uL (ref 0.0–0.5)
Eosinophils Relative: 0 %
HCT: 26 % — ABNORMAL LOW (ref 39.0–52.0)
Hemoglobin: 8.6 g/dL — ABNORMAL LOW (ref 13.0–17.0)
Immature Granulocytes: 2 %
Lymphocytes Relative: 14 %
Lymphs Abs: 1.1 10*3/uL (ref 0.7–4.0)
MCH: 29.7 pg (ref 26.0–34.0)
MCHC: 33.1 g/dL (ref 30.0–36.0)
MCV: 89.7 fL (ref 80.0–100.0)
Monocytes Absolute: 2.8 10*3/uL — ABNORMAL HIGH (ref 0.1–1.0)
Monocytes Relative: 38 %
Neutro Abs: 3.3 10*3/uL (ref 1.7–7.7)
Neutrophils Relative %: 46 %
Platelets: 84 10*3/uL — ABNORMAL LOW (ref 150–400)
RBC: 2.9 MIL/uL — ABNORMAL LOW (ref 4.22–5.81)
RDW: 15.9 % — ABNORMAL HIGH (ref 11.5–15.5)
WBC: 7.4 10*3/uL (ref 4.0–10.5)
nRBC: 0 % (ref 0.0–0.2)

## 2021-08-23 LAB — HEPARIN LEVEL (UNFRACTIONATED): Heparin Unfractionated: 0.84 IU/mL — ABNORMAL HIGH (ref 0.30–0.70)

## 2021-08-23 LAB — LIPID PANEL
Cholesterol: 55 mg/dL (ref 0–200)
HDL: 19 mg/dL — ABNORMAL LOW (ref >40)
LDL Cholesterol: 25 mg/dL (ref 0–99)
Total CHOL/HDL Ratio: 2.9 RATIO
Triglycerides: 56 mg/dL (ref <150)
VLDL: 11 mg/dL (ref 0–40)

## 2021-08-23 LAB — GLUCOSE, CAPILLARY
Glucose-Capillary: 94 mg/dL (ref 70–99)
Glucose-Capillary: 153 mg/dL — ABNORMAL HIGH (ref 70–99)
Glucose-Capillary: 92 mg/dL (ref 70–99)
Glucose-Capillary: 177 mg/dL — ABNORMAL HIGH (ref 70–99)

## 2021-08-23 LAB — APTT
aPTT: 65 seconds — ABNORMAL HIGH (ref 24–36)
aPTT: 81 seconds — ABNORMAL HIGH (ref 24–36)

## 2021-08-23 LAB — TROPONIN I (HIGH SENSITIVITY): Troponin I (High Sensitivity): 877 ng/L (ref <18)

## 2021-08-23 LAB — MAGNESIUM: Magnesium: 1.5 mg/dL — ABNORMAL LOW (ref 1.7–2.4)

## 2021-08-23 LAB — FERRITIN: Ferritin: 144 ng/mL (ref 24–336)

## 2021-08-23 LAB — D-DIMER, QUANTITATIVE: D-Dimer, Quant: 0.4 ug/mL-FEU (ref 0.00–0.50)

## 2021-08-23 LAB — C-REACTIVE PROTEIN: CRP: 3.5 mg/dL — ABNORMAL HIGH (ref <1.0)

## 2021-08-23 MED ORDER — MAGNESIUM SULFATE 4 GM/100ML IV SOLN
4.0000 g | Freq: Once | INTRAVENOUS | Status: AC
  Administered 2021-08-23: 10:00:00 4 g via INTRAVENOUS
  Filled 2021-08-23: qty 100

## 2021-08-23 MED ORDER — POTASSIUM CHLORIDE CRYS ER 20 MEQ PO TBCR
40.0000 meq | EXTENDED_RELEASE_TABLET | Freq: Once | ORAL | Status: AC
  Administered 2021-08-23: 10:00:00 40 meq via ORAL
  Filled 2021-08-23: qty 2

## 2021-08-23 NOTE — Progress Notes (Signed)
[place] for heparin infusion Indication: NSTEMI   Allergies  Allergen Reactions   Novocain [Procaine] Other (See Comments)    Syncope   Procaine Hcl Other (See Comments)    Passes out   Lyrica [Pregabalin]    Neurontin [Gabapentin]    Amitriptyline Other (See Comments)    Sleepy   Dicyclomine Other (See Comments)    Other reaction(s): Abdominal Pain   Metformin Nausea Only   Nitroglycerin Other (See Comments)    Makes Blood pressure go to low.     Patient Measurements: Height: 6\' 1"  (185.4 cm) Weight: 112.6 kg (248 lb 3.2 oz) IBW/kg (Calculated) : 79.9 Heparin Dosing Weight: 106 kg  Vital Signs: Temp: 98 F (36.7 C) (12/18 1154) Temp Source: Oral (12/18 1154) BP: 110/55 (12/18 1156) Pulse Rate: 59 (12/18 1156)  Labs: Recent Labs    08/21/21 2212 08/22/21 0100 08/22/21 0257 08/22/21 0500 08/22/21 0613 08/22/21 1412 08/22/21 1630 08/22/21 2202 08/23/21 0555 08/23/21 1551  HGB 9.4*  --   --   --   --   --   --   --  8.6*  --   HCT 29.0*  --   --   --   --   --   --   --  26.0*  --   PLT 99*  --   --   --   --   --   --   --  84*  --   APTT  --   --  56*  --    < > 69*  --   --  65* 81*  LABPROT  --   --  19.4*  --   --   --   --   --   --   --   INR  --   --  1.6*  --   --   --   --   --   --   --   HEPARINUNFRC  --   --  >1.10*  --   --   --   --   --  0.84*  --   CREATININE 1.10  --   --  0.86  --   --   --   --  0.92  --   TROPONINIHS 86*   < >  --  1,894*  --   --  1,244* 1,082* 877*  --    < > = values in this interval not displayed.     Estimated Creatinine Clearance: 85.6 mL/min (by C-G formula based on SCr of 0.92 mg/dL).   Medical History: Past Medical History:  Diagnosis Date   Arthritis    degenerative back    BPH with obstruction/lower urinary tract symptoms    CAD (coronary artery disease) 2010   stent   Diabetes mellitus    ED (erectile dysfunction)    Elevated PSA    GERD (gastroesophageal  reflux disease)    Gross hematuria    H/O CT scan    recent renal CT due to hematuria, cystoscopy - normal     Hematuria    History of hiatal hernia    History of kidney stones    HLD (hyperlipidemia)    Hypertension    Ischemic heart disease 1991   with angioplasty   Kidney stone    Morbid obesity ([place])    Myocardial infarction ([place])    Peripheral neuropathy    Pernicious anemia    Pneumonia 1995   /  w PE,    Pulmonary embolism ([place])    previous   Renal cyst    Sleep apnea    pt. denies sleep apnea, pt. states he has had 2 studies - last one 2011   Spinal stenosis     Medications:  PTA Meds:  Eliquis 5 mg BID, last dose unknown  Assessment: Pt is 80 yo male medical history significant for coronary artery disease, type 2 diabetes mellitus, GERD, hiatal hernia, dyslipidemia, hypertension, PE and sleep apnea, who presented to the ER with acute onset of chest pain that started last night in the midsternal area and felt as pressure, graded 4-5/10 in severity with no radiation. Pharmacy consulted to start heparin. Pt takes apixaban PTA. Will use aPTT monitoring due to false elevation in anti-xa levels.   12/17 0257 HL > 1.1 (Heparin infusion started 1217 @0305 ) 12/17 0613 aPTT 71.  12/17 1412 aPTT 69 12/18 0555 aPTT 65, slightly subtherapeutic, HL 0.84 12/18 1551 aPTT 81  Goal of Therapy:  Heparin level 0.3-0.7 units/ml once aPTT and heparin level correlate.  aPTT 66-102 seconds Monitor platelets by anticoagulation protocol: Yes   Plan:  Continue heparin infusion at 1500 units/hr. Recheck aPTT/HL in 8 hrs. CBC and heparin level daily while on heparin.   Dorothe Pea, PharmD, BCPS Clinical Pharmacist   08/23/2021 4:21 PM

## 2021-08-23 NOTE — Progress Notes (Signed)
Pt was admitted on the floor with no signs of distress. Pt alert x 4. VSS. Pt was educated about safety and ascom within pt reach. Will continue to monitor.

## 2021-08-23 NOTE — Progress Notes (Signed)
PROGRESS NOTE    Cody Price  AVW:[id number] DOB: June 05, 1941 DOA: 08/22/2021 PCP: Baxter Hire, MD   Brief Narrative: Taken from H&P. Cody Price is a 80 y.o. male with medical history significant for coronary artery disease, type 2 diabetes mellitus, GERD, hiatal hernia, dyslipidemia, hypertension, PE and sleep apnea, who presented to the ER with acute onset of chest pain that started last night in the midsternal area and felt as pressure, graded 4-5/10 in severity with no radiation. Patient has been vaccinated for COVID-19 but never received any booster. Apparently whole family is sick for the past 2-week.  Found to be positive for COVID-19.  Troponin elevated and continue to increase, chest x-ray with cardiomegaly and trace pleural effusion. EKG with atrial fibrillation, controlled ventricular response with PVCs and anteroseptal Q waves. He was started on heparin infusion for concern of NSTEMI Cardiology was consulted-still pending consult. Troponin peaked at [place], now trending down.  Echocardiogram still pending  Subjective: Patient continued to have some cough.  Denies any chest pain.  Assessment & Plan:   Principal Problem:   NSTEMI (non-ST elevated myocardial infarction) ([place])  NSTEMI.  Troponin peaked at [place], not trending down, is concerning for NSTEMI.  Patient has extensive history of CAD, s/p 3 stent placement, last was placed in 2021.  Echocardiogram was ordered and cardiology was consulted.  Per wife he was started on Eliquis by cardiology and they stopped aspirin and Plavix. Cardiology was consulted-will appreciate their recommendations -Continue heparin infusion-we can discontinue tomorrow morning after completing 48 hours. -Continue home dose of beta-blocker and statin -Eliquis will be restarted after stopping heparin tomorrow  COVID-19 infection.  Apparently symptoms for about 2 weeks.  CT value of 30.2 -Continue with remdesivir to complete a 3-day  course. -Continue with supportive care and supplements  Hypokalemia/hypomagnesemia.  Potassium of 3.2 with magnesium of 1.5 -Replete potassium and magnesium -Continue to monitor  Chronic HFrEF.  Appears euvolemic. -Repeat echocardiogram pending -Continue home dose of Lasix  Essential hypertension. -Continue home dose of Toprol-XL and Zestril  Type 2 diabetes mellitus.  Takes Amaryl at home -SSI  BPH. -Continue home dose of Proscar  Dyslipidemia. -Continue statin  GERD. -Continue PPI  Objective: Vitals:   08/23/21 0457 08/23/21 0829 08/23/21 1154 08/23/21 1156  BP: 121/64 134/68 91/67 (!) 110/55  Pulse: 81 (!) 104 76 (!) 59  Resp: 20 20 18    Temp: 98.3 F (36.8 C) (!) 97.4 F (36.3 C) 98 F (36.7 C)   TempSrc: Oral Oral Oral   SpO2: 92% 98% 97%   Weight:      Height:        Intake/Output Summary (Last 24 hours) at 08/23/2021 1628 Last data filed at 08/23/2021 0600 Gross per 24 hour  Intake 837.77 ml  Output --  Net 837.77 ml   Filed Weights   08/21/21 2207 08/22/21 0957 08/22/21 2213  Weight: 113.4 kg 113.4 kg 112.6 kg    Examination:  General.  Well-developed elderly man, in no acute distress. Pulmonary.  Lungs clear bilaterally, normal respiratory effort. CV.  Regular rate and rhythm, no JVD, rub or murmur. Abdomen.  Soft, nontender, nondistended, BS positive. CNS.  Alert and oriented .  No focal neurologic deficit. Extremities.  No edema, no cyanosis, pulses intact and symmetrical. Psychiatry.  Judgment and insight appears normal.   DVT prophylaxis: Heparin infusion Code Status: DNR Family Communication: Discussed with wife on phone. Disposition Plan:  Status is: Inpatient  Remains inpatient appropriate because: Severity  of illness  Level of care: Progressive  All the records are reviewed and case discussed with Care Management/Social Worker. Management plans discussed with the patient, nursing and they are in agreement.  Consultants:   Cardiology  Procedures:  Antimicrobials:   Data Reviewed: I have personally reviewed following labs and imaging studies  CBC: Recent Labs  Lab 08/21/21 2212 08/23/21 0555  WBC 9.0 7.4  NEUTROABS  --  3.3  HGB 9.4* 8.6*  HCT 29.0* 26.0*  MCV 90.6 89.7  PLT 99* 84*    Basic Metabolic Panel: Recent Labs  Lab 08/21/21 2212 08/22/21 0500 08/23/21 0555  NA 134* 138 138  K 3.5 3.4* 3.2*  CL 102 107 107  CO2 24 25 25   GLUCOSE 307* 172* 97  BUN 12 12 10   CREATININE 1.10 0.86 0.92  CALCIUM 8.3* 7.7* 8.1*  MG  --   --  1.5*    GFR: Estimated Creatinine Clearance: 85.6 mL/min (by C-G formula based on SCr of 0.92 mg/dL). Liver Function Tests: Recent Labs  Lab 08/22/21 0500 08/23/21 0555  AST 18 15  ALT 9 9  ALKPHOS 55 56  BILITOT 1.2 2.0*  PROT 6.1* 6.1*  ALBUMIN 2.9* 3.1*    No results for input(s): LIPASE, AMYLASE in the last 168 hours. No results for input(s): AMMONIA in the last 168 hours. Coagulation Profile: Recent Labs  Lab 08/22/21 0257  INR 1.6*    Cardiac Enzymes: No results for input(s): CKTOTAL, CKMB, CKMBINDEX, TROPONINI in the last 168 hours. BNP (last 3 results) No results for input(s): PROBNP in the last 8760 hours. HbA1C: No results for input(s): HGBA1C in the last 72 hours. CBG: Recent Labs  Lab 08/22/21 1042 08/22/21 1755 08/22/21 2217 08/23/21 0831 08/23/21 1158  GLUCAP 120* 167* 140* 94 153*    Lipid Profile: Recent Labs    08/23/21 0555  CHOL 55  HDL 19*  LDLCALC 25  TRIG 56  CHOLHDL 2.9   Thyroid Function Tests: No results for input(s): TSH, T4TOTAL, FREET4, T3FREE, THYROIDAB in the last 72 hours. Anemia Panel: Recent Labs    08/22/21 0500 08/23/21 0555  FERRITIN 131 144    Sepsis Labs: Recent Labs  Lab 08/22/21 0500  PROCALCITON <0.10     Recent Results (from the past 240 hour(s))  Resp Panel by RT-PCR (Flu A&B, Covid) Nasopharyngeal Swab     Status: Abnormal   Collection Time: 08/21/21 10:12 PM    Specimen: Nasopharyngeal Swab; Nasopharyngeal(NP) swabs in vial transport medium  Result Value Ref Range Status   SARS Coronavirus 2 by RT PCR POSITIVE (A) NEGATIVE Final    Comment: (NOTE) SARS-CoV-2 target nucleic acids are DETECTED.  The SARS-CoV-2 RNA is generally detectable in upper respiratory specimens during the acute phase of infection. Positive results are indicative of the presence of the identified virus, but do not rule out bacterial infection or co-infection with other pathogens not detected by the test. Clinical correlation with patient history and other diagnostic information is necessary to determine patient infection status. The expected result is Negative.  Fact Sheet for Patients: EntrepreneurPulse.com.au  Fact Sheet for Healthcare Providers: IncredibleEmployment.be  This test is not yet approved or cleared by the [place] FDA and  has been authorized for detection and/or diagnosis of SARS-CoV-2 by FDA under an Emergency Use Authorization (EUA).  This EUA will remain in effect (meaning this test can be used) for the duration of  the COVID-19 declaration under Section 564(b)(1) of the A ct, 21 U.S.C.  section 360bbb-3(b)(1), unless the authorization is terminated or revoked sooner.     Influenza A by PCR NEGATIVE NEGATIVE Final   Influenza B by PCR NEGATIVE NEGATIVE Final    Comment: (NOTE) The Xpert Xpress SARS-CoV-2/FLU/RSV plus assay is intended as an aid in the diagnosis of influenza from Nasopharyngeal swab specimens and should not be used as a sole basis for treatment. Nasal washings and aspirates are unacceptable for Xpert Xpress SARS-CoV-2/FLU/RSV testing.  Fact Sheet for Patients: EntrepreneurPulse.com.au  Fact Sheet for Healthcare Providers: IncredibleEmployment.be  This test is not yet approved or cleared by the [place] FDA and has been authorized for detection  and/or diagnosis of SARS-CoV-2 by FDA under an Emergency Use Authorization (EUA). This EUA will remain in effect (meaning this test can be used) for the duration of the COVID-19 declaration under Section 564(b)(1) of the Act, 21 U.S.C. section 360bbb-3(b)(1), unless the authorization is terminated or revoked.  Performed at [facility], [address] [place], [place] [address]       Radiology Studies: DG Chest 2 View  Result Date: 08/21/2021 CLINICAL DATA:  Cough and short of breath EXAM: CHEST - 2 VIEW COMPARISON:  07/08/2020 FINDINGS: Mild cardiomegaly. Trace pleural effusions. No focal opacity or pneumothorax. No overt pulmonary edema. IMPRESSION: Cardiomegaly with trace pleural effusions. Electronically Signed   By: Donavan Foil M.D.   On: 08/21/2021 22:37    Scheduled Meds:  vitamin C  500 mg Oral Daily   atorvastatin  40 mg Oral Daily   cholecalciferol  1,000 Units Oral Daily   cyanocobalamin  1,000 mcg Intramuscular Q30 days   finasteride  5 mg Oral Daily   furosemide  40 mg Oral BID   glimepiride  4 mg Oral BID   guaiFENesin  600 mg Oral BID   insulin aspart  0-15 Units Subcutaneous TID AC & HS   lisinopril  5 mg Oral Daily   metoprolol succinate  50 mg Oral Q breakfast   multivitamin with minerals  1 tablet Oral Daily   omega-3 acid ethyl esters  2 g Oral Daily   pantoprazole  40 mg Oral Daily   potassium chloride SA  10 mEq Oral Daily   tamsulosin  0.4 mg Oral Daily   zinc sulfate  220 mg Oral Daily   Continuous Infusions:  sodium chloride 100 mL/hr at 08/22/21 2330   heparin 1,500 Units/hr (08/23/21 0824)   remdesivir 100 mg in NS 100 mL 100 mg (08/23/21 0832)     LOS: 1 day   Time spent: 40 minutes. More than 50% of the time was spent in counseling/coordination of care  Lorella Nimrod, MD [facility]  If 7PM-7AM, please contact night-coverage [email or web address]  08/23/2021, 4:28 PM   This record has been created using Therapist, sports. Errors have been sought and corrected,but may not always be located. Such creation errors do not reflect on the standard of care.

## 2021-08-23 NOTE — Progress Notes (Signed)
[place] for heparin infusion Indication: NSTEMI   Allergies  Allergen Reactions   Novocain [Procaine] Other (See Comments)    Syncope   Procaine Hcl Other (See Comments)    Passes out   Lyrica [Pregabalin]    Neurontin [Gabapentin]    Amitriptyline Other (See Comments)    Sleepy   Dicyclomine Other (See Comments)    Other reaction(s): Abdominal Pain   Metformin Nausea Only   Nitroglycerin Other (See Comments)    Makes Blood pressure go to low.     Patient Measurements: Height: 6\' 1"  (185.4 cm) Weight: 112.6 kg (248 lb 3.2 oz) IBW/kg (Calculated) : 79.9 Heparin Dosing Weight: 106 kg  Vital Signs: Temp: 98.3 F (36.8 C) (12/18 0457) Temp Source: Oral (12/18 0457) BP: 121/64 (12/18 0457) Pulse Rate: 81 (12/18 0457)  Labs: Recent Labs    08/21/21 2212 08/22/21 0100 08/22/21 0257 08/22/21 0500 08/22/21 0613 08/22/21 1412 08/22/21 1630 08/22/21 2202 08/23/21 0555  HGB 9.4*  --   --   --   --   --   --   --  8.6*  HCT 29.0*  --   --   --   --   --   --   --  26.0*  PLT 99*  --   --   --   --   --   --   --  84*  APTT  --    < > 56*  --  71* 69*  --   --  65*  LABPROT  --   --  19.4*  --   --   --   --   --   --   INR  --   --  1.6*  --   --   --   --   --   --   HEPARINUNFRC  --   --  >1.10*  --   --   --   --   --  0.84*  CREATININE 1.10  --   --  0.86  --   --   --   --  0.92  TROPONINIHS 86*   < >  --  1,894*  --   --  1,244* 1,082* 877*   < > = values in this interval not displayed.     Estimated Creatinine Clearance: 85.6 mL/min (by C-G formula based on SCr of 0.92 mg/dL).   Medical History: Past Medical History:  Diagnosis Date   Arthritis    degenerative back    BPH with obstruction/lower urinary tract symptoms    CAD (coronary artery disease) 2010   stent   Diabetes mellitus    ED (erectile dysfunction)    Elevated PSA    GERD (gastroesophageal reflux disease)    Gross hematuria    H/O CT scan     recent renal CT due to hematuria, cystoscopy - normal     Hematuria    History of hiatal hernia    History of kidney stones    HLD (hyperlipidemia)    Hypertension    Ischemic heart disease 1991   with angioplasty   Kidney stone    Morbid obesity ([place])    Myocardial infarction ([place])    Peripheral neuropathy    Pernicious anemia    Pneumonia 1995   /w PE,    Pulmonary embolism ([place])    previous   Renal cyst    Sleep apnea    pt. denies sleep  apnea, pt. states he has had 2 studies - last one 2011   Spinal stenosis     Medications:  PTA Meds:  Eliquis 5 mg BID, last dose unknown  Assessment: Pt is 80 yo male medical history significant for coronary artery disease, type 2 diabetes mellitus, GERD, hiatal hernia, dyslipidemia, hypertension, PE and sleep apnea, who presented to the ER with acute onset of chest pain that started last night in the midsternal area and felt as pressure, graded 4-5/10 in severity with no radiation. Pharmacy consulted to start heparin. Pt takes apixaban PTA. Will use aPTT monitoring due to false elevation in anti-xa levels.   12/17 0257 HL > 1.1 (Heparin infusion started 1217 @0305 ) 12/17 0613 aPTT 71.  12/17 1412 aPTT 69 12/18 0555 aPTT 65, slightly subtherapeutic, HL 0.84  Goal of Therapy:  Heparin level 0.3-0.7 units/ml once aPTT and heparin level correlate.  aPTT 66-102 seconds Monitor platelets by anticoagulation protocol: Yes   Plan:  Will increase heparin infusion to 1500 units/hr. Recheck aPTT  in 8 hrs after rate change. CBC and heparin level daily while on heparin.   Renda Rolls, PharmD, [facility] 08/23/2021 7:15 AM

## 2021-08-23 NOTE — Plan of Care (Signed)

## 2021-08-24 ENCOUNTER — Inpatient Hospital Stay (HOSPITAL_COMMUNITY)
Admit: 2021-08-24 | Discharge: 2021-08-24 | Disposition: A | Discharge status: Home / Self Care | Payer: Medicare HMO | Attending: Family Medicine | Admitting: Family Medicine

## 2021-08-24 DIAGNOSIS — I214 Non-ST elevation (NSTEMI) myocardial infarction: Secondary | ICD-10-CM

## 2021-08-24 DIAGNOSIS — I5032 Chronic diastolic (congestive) heart failure: Secondary | ICD-10-CM

## 2021-08-24 DIAGNOSIS — U071 COVID-19: Principal | ICD-10-CM

## 2021-08-24 DIAGNOSIS — I4821 Permanent atrial fibrillation: Secondary | ICD-10-CM

## 2021-08-24 LAB — GLUCOSE, CAPILLARY
Glucose-Capillary: 143 mg/dL — ABNORMAL HIGH (ref 70–99)
Glucose-Capillary: 156 mg/dL — ABNORMAL HIGH (ref 70–99)
Glucose-Capillary: 160 mg/dL — ABNORMAL HIGH (ref 70–99)
Glucose-Capillary: 113 mg/dL — ABNORMAL HIGH (ref 70–99)

## 2021-08-24 LAB — ECHOCARDIOGRAM COMPLETE
AR max vel: 3.28 cm2
AV Area VTI: 4.08 cm2
AV Area mean vel: 3.41 cm2
AV Mean grad: 2 mmHg
AV Peak grad: 3.9 mmHg
Ao pk vel: 0.99 m/s
Area-P 1/2: 3.72 cm2
Height: 73 in
MV VTI: 1.94 cm2
S' Lateral: 4 cm
Weight: 3971.2 oz

## 2021-08-24 LAB — CBC WITH DIFFERENTIAL/PLATELET
Abs Immature Granulocytes: 0.12 10*3/uL — ABNORMAL HIGH (ref 0.00–0.07)
Basophils Absolute: 0 10*3/uL (ref 0.0–0.1)
Basophils Relative: 0 %
Eosinophils Absolute: 0.1 10*3/uL (ref 0.0–0.5)
Eosinophils Relative: 1 %
HCT: 25.6 % — ABNORMAL LOW (ref 39.0–52.0)
Hemoglobin: 8.3 g/dL — ABNORMAL LOW (ref 13.0–17.0)
Immature Granulocytes: 2 %
Lymphocytes Relative: 13 %
Lymphs Abs: 1 10*3/uL (ref 0.7–4.0)
MCH: 29.5 pg (ref 26.0–34.0)
MCHC: 32.4 g/dL (ref 30.0–36.0)
MCV: 91.1 fL (ref 80.0–100.0)
Monocytes Absolute: 3.1 10*3/uL — ABNORMAL HIGH (ref 0.1–1.0)
Monocytes Relative: 42 %
Neutro Abs: 3.1 10*3/uL (ref 1.7–7.7)
Neutrophils Relative %: 42 %
Platelets: 82 10*3/uL — ABNORMAL LOW (ref 150–400)
RBC: 2.81 MIL/uL — ABNORMAL LOW (ref 4.22–5.81)
RDW: 16 % — ABNORMAL HIGH (ref 11.5–15.5)
WBC: 7.4 10*3/uL (ref 4.0–10.5)
nRBC: 0 % (ref 0.0–0.2)

## 2021-08-24 LAB — COMPREHENSIVE METABOLIC PANEL
ALT: 9 U/L (ref 0–44)
AST: 12 U/L — ABNORMAL LOW (ref 15–41)
Albumin: 2.9 g/dL — ABNORMAL LOW (ref 3.5–5.0)
Alkaline Phosphatase: 66 U/L (ref 38–126)
Anion gap: 7 (ref 5–15)
BUN: 12 mg/dL (ref 8–23)
CO2: 24 mmol/L (ref 22–32)
Calcium: 8 mg/dL — ABNORMAL LOW (ref 8.9–10.3)
Chloride: 107 mmol/L (ref 98–111)
Creatinine, Ser: 0.89 mg/dL (ref 0.61–1.24)
GFR, Estimated: >60 mL/min (ref >60)
Glucose, Bld: 108 mg/dL — ABNORMAL HIGH (ref 70–99)
Potassium: 3.3 mmol/L — ABNORMAL LOW (ref 3.5–5.1)
Sodium: 138 mmol/L (ref 135–145)
Total Bilirubin: 1.7 mg/dL — ABNORMAL HIGH (ref 0.3–1.2)
Total Protein: 6.1 g/dL — ABNORMAL LOW (ref 6.5–8.1)

## 2021-08-24 LAB — APTT
aPTT: 72 seconds — ABNORMAL HIGH (ref 24–36)
aPTT: 79 seconds — ABNORMAL HIGH (ref 24–36)

## 2021-08-24 LAB — D-DIMER, QUANTITATIVE: D-Dimer, Quant: 0.34 ug/mL-FEU (ref 0.00–0.50)

## 2021-08-24 LAB — MAGNESIUM: Magnesium: 1.8 mg/dL (ref 1.7–2.4)

## 2021-08-24 LAB — HEPARIN LEVEL (UNFRACTIONATED)
Heparin Unfractionated: 0.59 IU/mL (ref 0.30–0.70)
Heparin Unfractionated: 0.69 IU/mL (ref 0.30–0.70)

## 2021-08-24 LAB — C-REACTIVE PROTEIN: CRP: 5 mg/dL — ABNORMAL HIGH (ref <1.0)

## 2021-08-24 LAB — FERRITIN: Ferritin: 147 ng/mL (ref 24–336)

## 2021-08-24 MED ORDER — MAGNESIUM SULFATE 2 GM/50ML IV SOLN
2.0000 g | Freq: Once | INTRAVENOUS | Status: AC
  Administered 2021-08-24: 10:00:00 2 g via INTRAVENOUS
  Filled 2021-08-24: qty 50

## 2021-08-24 MED ORDER — POTASSIUM CHLORIDE CRYS ER 20 MEQ PO TBCR
40.0000 meq | EXTENDED_RELEASE_TABLET | Freq: Once | ORAL | Status: AC
  Administered 2021-08-24: 13:00:00 40 meq via ORAL

## 2021-08-24 NOTE — Consult Note (Signed)
Cardiology Consultation:   Patient ID: Cody Price MRN: [id number]; DOB: 08-Apr-1941  Admit date: 08/22/2021 Date of Consult: 08/24/2021  PCP:  Baxter Hire, MD   [facility] Providers Cardiologist:  Ida Rogue, MD        Patient Profile:   Cody Price is a 80 y.o. male with a hx of coronary artery disease with multiple PCI's and chronic atrial fibrillation, who is being seen 08/24/2021 for the evaluation of non-ST elevation myocardial infarction at the request of Dr. Reesa Chew.  History of Present Illness:   Cody Price is a 80 year old male with known history of coronary artery disease status post PCI, chronic atrial fibrillation on anticoagulation with Eliquis, type 2 diabetes, GERD, hyperlipidemia, essential hypertension, previous PE and sleep apnea.  He has been having symptoms of dry cough with associated wheezing and shortness of breath.  He then started having substernal chest pain and tightness which has been intermittent over the last few days.  The patient was found to have COVID-19 infection and in addition his troponin was elevated initially to 86 and subsequently increased to almost 2000.  BNP was mildly elevated at 574.  He continues to have cough and shortness of breath but chest pain improved.   Past Medical History:  Diagnosis Date   Arthritis    degenerative back    BPH with obstruction/lower urinary tract symptoms    CAD (coronary artery disease) 2010   stent   Diabetes mellitus    ED (erectile dysfunction)    Elevated PSA    GERD (gastroesophageal reflux disease)    Gross hematuria    H/O CT scan    recent renal CT due to hematuria, cystoscopy - normal     Hematuria    History of hiatal hernia    History of kidney stones    HLD (hyperlipidemia)    Hypertension    Ischemic heart disease 1991   with angioplasty   Kidney stone    Morbid obesity ([place])    Myocardial infarction ([place])    Peripheral neuropathy    Pernicious anemia    Pneumonia  1995   /w PE,    Pulmonary embolism ([place])    previous   Renal cyst    Sleep apnea    pt. denies sleep apnea, pt. states he has had 2 studies - last one 2011   Spinal stenosis     Past Surgical History:  Procedure Laterality Date   APPENDECTOMY     BACK SURGERY     x3 back surgeries - /w fusioin, 1- [place] 2- [facility]   bilateral inguinal hernia repair     cad     stent 2010   CARDIAC CATHETERIZATION  08/17/2014   CARPAL TUNNEL RELEASE  2014   bilateral    CHOLECYSTECTOMY     COLONOSCOPY WITH PROPOFOL N/A 07/11/2019   Procedure: COLONOSCOPY WITH PROPOFOL;  Surgeon: Toledo, Benay Pike, MD;  Location: [facility] ENDOSCOPY;  Service: Gastroenterology;  Laterality: N/A;   CORONARY BALLOON ANGIOPLASTY N/A 07/11/2020   Procedure: CORONARY BALLOON ANGIOPLASTY;  Surgeon: Isaias Cowman, MD;  Location: [place] [facility];  Service: Cardiovascular;  Laterality: N/A;   CORONARY STENT INTERVENTION N/A 06/07/2018   Procedure: CORONARY STENT INTERVENTION;  Surgeon: Nelva Bush, MD;  Location: [place] [facility];  Service: Cardiovascular;  Laterality: N/A;   CORONARY STENT INTERVENTION N/A 07/11/2020   Procedure: CORONARY STENT INTERVENTION;  Surgeon: Isaias Cowman, MD;  Location: [place] [facility];  Service: Cardiovascular;  Laterality: N/A;   ESOPHAGOGASTRODUODENOSCOPY (EGD) WITH PROPOFOL N/A 07/11/2019   Procedure: ESOPHAGOGASTRODUODENOSCOPY (EGD) WITH PROPOFOL;  Surgeon: Toledo, Benay Pike, MD;  Location: [facility] ENDOSCOPY;  Service: Gastroenterology;  Laterality: N/A;   LAMINECTOMY     LEFT HEART CATH AND CORONARY ANGIOGRAPHY N/A 06/07/2018   Procedure: LEFT HEART CATH AND CORONARY ANGIOGRAPHY;  Surgeon: Nelva Bush, MD;  Location: [place] [facility];  Service: Cardiovascular;  Laterality: N/A;   RIGHT/LEFT HEART CATH AND CORONARY ANGIOGRAPHY N/A 07/11/2020   Procedure: RIGHT/LEFT HEART CATH AND CORONARY ANGIOGRAPHY;  Surgeon: Minna Merritts, MD;  Location: [place] [facility];  Service: Cardiovascular;  Laterality: N/A;     Home Medications:  Prior to Admission medications   Medication Sig Start Date End Date Taking? Authorizing Provider  acetaminophen (TYLENOL) 500 MG tablet Take 500 mg by mouth as needed.    Yes [provider]  apixaban (ELIQUIS) 5 MG TABS tablet Take 1 tablet (5 mg total) by mouth 2 (two) times daily. 04/21/21  Yes Gollan, Kathlene November, MD  atorvastatin (LIPITOR) 40 MG tablet TAKE 1 TABLET BY MOUTH DAILY AT 6 PM. 04/23/21  Yes Gollan, Kathlene November, MD  Coenzyme Q10 (CO Q-10) 200 MG CAPS Take 200 mg by mouth daily.   Yes [provider]  cyanocobalamin (,VITAMIN B-12,) 1000 MCG/ML injection Inject 1,000 mcg into the muscle every 30 (thirty) days. 09/12/19  Yes [provider]  diazepam (VALIUM) 5 MG tablet Take 5 mg by mouth 3 (three) times daily as needed for anxiety or muscle spasms.   Yes [provider]  finasteride (PROSCAR) 5 MG tablet TAKE 1 TABLET BY MOUTH EVERY DAY 12/01/20  Yes McGowan, Larene Beach A, PA-C  furosemide (LASIX) 40 MG tablet Take 1 tablet (40 mg total) by mouth 2 (two) times daily. 04/21/21 08/22/21 Yes Gollan, Kathlene November, MD  glimepiride (AMARYL) 4 MG tablet Take 4 mg by mouth 2 (two) times daily.   Yes [provider]  KLOR-CON M20 20 MEQ tablet TAKE 1 TABLET BY MOUTH EVERY DAY Patient taking differently: Take 10 mEq by mouth daily. 08/13/21  Yes Minna Merritts, MD  lisinopril (ZESTRIL) 5 MG tablet Take 1 tablet (5 mg total) by mouth daily. 04/21/21  Yes Gollan, Kathlene November, MD  metoprolol succinate (TOPROL-XL) 50 MG 24 hr tablet TAKE 1 TABLET BY MOUTH DAILY. TAKE WITH OR IMMEDIATELY FOLLOWING A MEAL. 05/21/21  Yes Minna Merritts, MD  Multiple Vitamin (MULTIVITAMIN WITH MINERALS) TABS tablet Take 1 tablet by mouth daily. 06/08/18  Yes Wieting, Richard, MD  omega-3 acid ethyl esters (LOVAZA) 1 g capsule Take 2 g by mouth daily.   Yes [provider]  omeprazole (PRILOSEC) 40 MG  capsule Take 40 mg by mouth 2 (two) times daily. 06/07/19  Yes [provider]  oxyCODONE-acetaminophen (PERCOCET) 10-325 MG tablet Take 1 tablet by mouth every 4 (four) hours as needed for pain.    Yes [provider]  tamsulosin (FLOMAX) 0.4 MG CAPS capsule TAKE 1 CAPSULE BY MOUTH EVERY DAY 12/01/20  Yes McGowan, Larene Beach A, PA-C  testosterone cypionate (DEPOTESTOSTERONE CYPIONATE) 200 MG/ML injection Inject 200 mg into the muscle every 28 (twenty-eight) days.   Yes [provider]  metolazone (ZAROXOLYN) 2.5 MG tablet Take 2.5 mg by mouth daily. Patient not taking: Reported on 08/22/2021 07/10/21   [provider]  oxyCODONE (OXY IR/ROXICODONE) 5 MG immediate release tablet Take 5 mg by mouth every 8 (eight) hours as needed. Patient not taking:  Reported on 08/22/2021 07/17/21   [provider]  sildenafil (VIAGRA) 100 MG tablet Take 100 mg by mouth daily as needed for erectile dysfunction.    [provider]    Inpatient Medications: Scheduled Meds:  vitamin C  500 mg Oral Daily   atorvastatin  40 mg Oral Daily   cholecalciferol  1,000 Units Oral Daily   cyanocobalamin  1,000 mcg Intramuscular Q30 days   finasteride  5 mg Oral Daily   furosemide  40 mg Oral BID   glimepiride  4 mg Oral BID   guaiFENesin  600 mg Oral BID   insulin aspart  0-15 Units Subcutaneous TID AC & HS   lisinopril  5 mg Oral Daily   metoprolol succinate  50 mg Oral Q breakfast   multivitamin with minerals  1 tablet Oral Daily   omega-3 acid ethyl esters  2 g Oral Daily   pantoprazole  40 mg Oral Daily   potassium chloride SA  10 mEq Oral Daily   tamsulosin  0.4 mg Oral Daily   zinc sulfate  220 mg Oral Daily   Continuous Infusions:  sodium chloride 100 mL/hr at 08/23/21 2119   heparin 1,500 Units/hr (08/23/21 1800)   PRN Meds: oxyCODONE **AND** acetaminophen, acetaminophen, chlorpheniramine-HYDROcodone, diazepam, guaiFENesin-dextromethorphan, magnesium  hydroxide, ondansetron **OR** ondansetron (ZOFRAN) IV, traZODone  Allergies:    Allergies  Allergen Reactions   Novocain [Procaine] Other (See Comments)    Syncope   Procaine Hcl Other (See Comments)    Passes out   Lyrica [Pregabalin]    Neurontin [Gabapentin]    Amitriptyline Other (See Comments)    Sleepy   Dicyclomine Other (See Comments)    Other reaction(s): Abdominal Pain   Metformin Nausea Only   Nitroglycerin Other (See Comments)    Makes Blood pressure go to low.     Social History:   Social History   Socioeconomic History   Marital status: Married    Spouse name: Not on file   Number of children: Not on file   Years of education: Not on file   Highest education level: Not on file  Occupational History   Not on file  Tobacco Use   Smoking status: Former    Packs/day: 3.00    Years: 15.00    Pack years: 45.00    Types: Cigarettes    Quit date: 10/18/1985    Years since quitting: 35.8   Smokeless tobacco: Former   Tobacco comments:    tobacco use- no   Vaping Use   Vaping Use: Never used  Substance and Sexual Activity   Alcohol use: Not Currently    Comment: rare   Drug use: No   Sexual activity: Not on file  Other Topics Concern   Not on file  Social History Narrative   Walks regularly. Retired.    Social Determinants of Health   Financial Resource Strain: Not on file  Food Insecurity: Not on file  Transportation Needs: Not on file  Physical Activity: Not on file  Stress: Not on file  Social Connections: Not on file  Intimate Partner Violence: Not on file    Family History:    Family History  Problem Relation Age of Onset   Heart failure Mother    Heart disease Father    Heart attack Brother    Skin cancer Sister    Kidney disease Neg Hx    Prostate cancer Neg Hx    Kidney cancer Neg Hx  Bladder Cancer Neg Hx      ROS:  Please see the history of present illness.   All other ROS reviewed and negative.     Physical Exam/Data:    Vitals:   08/23/21 2045 08/24/21 0016 08/24/21 0408 08/24/21 1255  BP: (!) 115/59 (!) 106/59 122/63 106/70  Pulse: 82 77 (!) 102 75  Resp: 18 16 16 14   Temp: 97.7 F (36.5 C) 98.1 F (36.7 C) 97.6 F (36.4 C) 97.8 F (36.6 C)  TempSrc:      SpO2: 97% 97% 94% 98%  Weight:      Height:        Intake/Output Summary (Last 24 hours) at 08/24/2021 1635 Last data filed at 08/24/2021 0900 Gross per 24 hour  Intake 1137.64 ml  Output 1300 ml  Net -162.36 ml   Last 3 Weights 08/22/2021 08/22/2021 08/21/2021  Weight (lbs) 248 lb 3.2 oz 250 lb 250 lb  Weight (kg) 112.583 kg 113.399 kg 113.399 kg     Body mass index is 32.75 kg/m.  General:  Well nourished, well developed, in no acute distress HEENT: normal Neck: no JVD Vascular: No carotid bruits; Distal pulses 2+ bilaterally Cardiac:  normal S1, S2; RRR; no murmur  Lungs: Diminished breath sounds bilaterally with some rhonchi. Abd: soft, nontender, no hepatomegaly  Ext: Mild bilateral leg edema. Musculoskeletal:  No deformities, BUE and BLE strength normal and equal Skin: warm and dry  Neuro:  CNs 2-12 intact, no focal abnormalities noted Psych:  Normal affect   EKG:  The EKG was personally reviewed and demonstrates: Atrial fibrillation with ventricular rate of 96 bpm.  Low voltage with old septal infarct. Telemetry:  Telemetry was personally reviewed and demonstrates: Atrial fibrillation  Relevant CV Studies: I reviewed most recent cardiac catheterization done in November 2021 which showed significant mid LAD stenosis distal to the previously placed stent with moderate proximal left circumflex disease and chronically occluded small RCA with left-to-right collaterals.  Laboratory Data:  High Sensitivity Troponin:   Recent Labs  Lab 08/22/21 0100 08/22/21 0500 08/22/21 1630 08/22/21 2202 08/23/21 0555  TROPONINIHS 1,252* 1,894* 1,244* 1,082* 877*     Chemistry Recent Labs  Lab 08/22/21 0500 08/23/21 0555  08/24/21 0507  NA 138 138 138  K 3.4* 3.2* 3.3*  CL 107 107 107  CO2 25 25 24   GLUCOSE 172* 97 108*  BUN 12 10 12   CREATININE 0.86 0.92 0.89  CALCIUM 7.7* 8.1* 8.0*  MG  --  1.5* 1.8  GFRNONAA >60 >60 >60  ANIONGAP 6 6 7     Recent Labs  Lab 08/22/21 0500 08/23/21 0555 08/24/21 0507  PROT 6.1* 6.1* 6.1*  ALBUMIN 2.9* 3.1* 2.9*  AST 18 15 12*  ALT 9 9 9   ALKPHOS 55 56 66  BILITOT 1.2 2.0* 1.7*   Lipids  Recent Labs  Lab 08/23/21 0555  CHOL 55  TRIG 56  HDL 19*  LDLCALC 25  CHOLHDL 2.9    Hematology Recent Labs  Lab 08/21/21 2212 08/23/21 0555 08/24/21 0507  WBC 9.0 7.4 7.4  RBC 3.20* 2.90* 2.81*  HGB 9.4* 8.6* 8.3*  HCT 29.0* 26.0* 25.6*  MCV 90.6 89.7 91.1  MCH 29.4 29.7 29.5  MCHC 32.4 33.1 32.4  RDW 15.7* 15.9* 16.0*  PLT 99* 84* 82*   Thyroid No results for input(s): TSH, FREET4 in the last 168 hours.  BNP Recent Labs  Lab 08/22/21 0500  BNP 574.8*    DDimer  Recent Labs  Lab 08/22/21 0500 08/23/21 0555 08/24/21 0507  DDIMER 0.30 0.40 0.34     Radiology/Studies:  DG Chest 2 View  Result Date: 08/21/2021 CLINICAL DATA:  Cough and short of breath EXAM: CHEST - 2 VIEW COMPARISON:  07/08/2020 FINDINGS: Mild cardiomegaly. Trace pleural effusions. No focal opacity or pneumothorax. No overt pulmonary edema. IMPRESSION: Cardiomegaly with trace pleural effusions. Electronically Signed   By: Donavan Foil M.D.   On: 08/21/2021 22:37   ECHOCARDIOGRAM COMPLETE  Result Date: 08/24/2021    ECHOCARDIOGRAM REPORT   Patient Name:   Cody Price Date of Exam: 08/24/2021 Medical Rec #:  [id number]      Height:       73.0 in Accession #:    [id number]     Weight:       248.2 lb Date of Birth:  [birth year]       BSA:          2.358 m Patient Age:    60 years       BP:           122/63 mmHg Patient Gender: M              HR:           102 bpm. Exam Location:  [facility] Procedure: 2D Echo, Cardiac Doppler and Color Doppler Indications:     NSTEMI I21.4  History:          Patient has prior history of Echocardiogram examinations, most                  recent 07/08/2020. CAD and Previous Myocardial Infarction; Risk                  Factors:Diabetes and Hypertension.  Sonographer:     Sherrie Sport Referring Phys:  [id number] [place] Diagnosing Phys: Kathlyn Sacramento MD  Sonographer Comments: Suboptimal apical window. IMPRESSIONS  1. Left ventricular ejection fraction, by estimation, is 50 to 55%. The left ventricle has low normal function. The left ventricle has no regional wall motion abnormalities. The left ventricular internal cavity size was mildly dilated. There is mild left ventricular hypertrophy. Left ventricular diastolic parameters are indeterminate.  2. Right ventricular systolic function is normal. The right ventricular size is normal. There is moderately elevated pulmonary artery systolic pressure. The estimated right ventricular systolic pressure is 35.3 mmHg.  3. Left atrial size was moderately dilated.  4. Right atrial size was moderately dilated.  5. The mitral valve is abnormal. Moderate mitral valve regurgitation. No evidence of mitral stenosis.  6. The aortic valve is calcified. Aortic valve regurgitation is not visualized. Aortic valve sclerosis/calcification is present, without any evidence of aortic stenosis. FINDINGS  Left Ventricle: Left ventricular ejection fraction, by estimation, is 50 to 55%. The left ventricle has low normal function. The left ventricle has no regional wall motion abnormalities. The left ventricular internal cavity size was mildly dilated. There is mild left ventricular hypertrophy. Left ventricular diastolic parameters are indeterminate. Right Ventricle: The right ventricular size is normal. No increase in right ventricular wall thickness. Right ventricular systolic function is normal. There is moderately elevated pulmonary artery systolic pressure. The tricuspid regurgitant velocity is 3.30 m/s, and with an assumed right atrial pressure  of 10 mmHg, the estimated right ventricular systolic pressure is 29.9 mmHg. Left Atrium: Left atrial size was moderately dilated. Right Atrium: Right atrial size was moderately dilated. Pericardium: Trivial pericardial effusion is present. Mitral Valve: The mitral valve is  abnormal. There is mild thickening of the mitral valve leaflet(s). Moderate mitral valve regurgitation. No evidence of mitral valve stenosis. MV peak gradient, 9.5 mmHg. The mean mitral valve gradient is 3.0 mmHg. Tricuspid Valve: The tricuspid valve is normal in structure. Tricuspid valve regurgitation is mild . No evidence of tricuspid stenosis. Aortic Valve: The aortic valve is calcified. Aortic valve regurgitation is not visualized. Aortic valve sclerosis/calcification is present, without any evidence of aortic stenosis. Aortic valve mean gradient measures 2.0 mmHg. Aortic valve peak gradient measures 3.9 mmHg. Aortic valve area, by VTI measures 4.08 cm. Pulmonic Valve: The pulmonic valve was normal in structure. Pulmonic valve regurgitation is trivial. No evidence of pulmonic stenosis. Aorta: The aortic root is normal in size and structure. Venous: The inferior vena cava was not well visualized. IAS/Shunts: No atrial level shunt detected by color flow Doppler.  LEFT VENTRICLE PLAX 2D LVIDd:         5.50 cm LVIDs:         4.00 cm LV PW:         1.10 cm LV IVS:        1.15 cm LVOT diam:     2.00 cm LV SV:         64 LV SV Index:   27 LVOT Area:     3.14 cm  RIGHT VENTRICLE RV Basal diam:  4.00 cm RV S prime:     12.20 cm/s TAPSE (M-mode): 3.5 cm LEFT ATRIUM              Index        RIGHT ATRIUM           Index LA diam:        4.40 cm  1.87 cm/m   RA Area:     30.00 cm LA Vol (A2C):   145.0 ml 61.48 ml/m  RA Volume:   110.00 ml 46.64 ml/m LA Vol (A4C):   101.0 ml 42.83 ml/m LA Biplane Vol: 127.0 ml 53.85 ml/m  AORTIC VALVE                    PULMONIC VALVE AV Area (Vmax):    3.28 cm     PV Vmax:        0.67 m/s AV Area (Vmean):   3.41  cm     PV Vmean:       47.700 cm/s AV Area (VTI):     4.08 cm     PV VTI:         0.124 m AV Vmax:           98.80 cm/s   PV Peak grad:   1.8 mmHg AV Vmean:          63.050 cm/s  PV Mean grad:   1.0 mmHg AV VTI:            0.156 m      RVOT Peak grad: 2 mmHg AV Peak Grad:      3.9 mmHg AV Mean Grad:      2.0 mmHg LVOT Vmax:         103.00 cm/s LVOT Vmean:        68.500 cm/s LVOT VTI:          0.203 m LVOT/AV VTI ratio: 1.30  AORTA Ao Root diam: 3.53 cm MITRAL VALVE                TRICUSPID VALVE MV Area (PHT): 3.72 cm  TR Peak grad:   43.6 mmHg MV Area VTI:   1.94 cm     TR Vmax:        330.00 cm/s MV Peak grad:  9.5 mmHg MV Mean grad:  3.0 mmHg     SHUNTS MV Vmax:       1.54 m/s     Systemic VTI:  0.20 m MV Vmean:      75.8 cm/s    Systemic Diam: 2.00 cm MV Decel Time: 204 msec     Pulmonic VTI:  0.124 m MV E velocity: 112.00 cm/s Kathlyn Sacramento MD Electronically signed by Kathlyn Sacramento MD Signature Date/Time: 08/24/2021/9:21:41 AM    Final      Assessment and Plan:   Non-ST elevation myocardial infarction: The patient has known history of coronary artery disease.  Fortunately, his echocardiogram showed stable LV systolic function with an EF of 50 to 55% with moderate pulmonary hypertension and moderate mitral regurgitation.  The patient will require cardiac catheterization once his COVID-19 infection improves.  At the present time he is still coughing significantly.  Most likely, will do the procedure on Wednesday.  I discussed with pharmacy to keep Eliquis on hold and instead use heparin drip. Permanent atrial fibrillation: Ventricular rate is reasonably controlled on Toprol 50 mg once daily.  We will anticoagulate with heparin drip for now given that Eliquis is on hold for anticipated cath. Chronic diastolic heart failure: Echo does show moderate pulmonary hypertension: Suspect that the patient is mildly volume overloaded.  Continue furosemide 40 mg orally twice daily. COVID-19 infection:  Management per hospitalist.   Risk Assessment/Risk Scores:     TIMI Risk Score for Unstable Angina or Non-ST Elevation MI:   The patient's TIMI risk score is 5, which indicates a 26% risk of all cause mortality, new or recurrent myocardial infarction or need for urgent revascularization in the next 14 days.       For questions or updates, please contact [place] Please consult [email or web address] for contact info under    Signed, Kathlyn Sacramento, MD  08/24/2021 4:35 PM

## 2021-08-24 NOTE — TOC Initial Note (Signed)
Transition of Care [facility]) - Initial/Assessment Note    Patient Details  Name: Cody Price MRN: [id number] Date of Birth: 10/12/1940  Transition of Care [facility]) CM/SW Contact:    Alberteen Sam, LCSW Phone Number: 08/24/2021, 9:54 AM  Clinical Narrative:                  CSW spoke with patient to complete readmission risk assessment. Patient reports he is from home with his wife. [facility] [place] [facility]. Patient reports he continues to see Dr. Harrel Lemon as his PCP. Patient reports he has no issues getting to and from his appointments or getting his medications.   No identified needs at this time.     Expected Discharge Plan: Home/Self Care Barriers to Discharge: Continued Medical Work up   Patient Goals and CMS Choice Patient states their goals for this hospitalization and ongoing recovery are:: to go home CMS Medicare.gov Compare Post Acute Care list provided to:: Patient Choice offered to / list presented to : Patient  Expected Discharge Plan and Services Expected Discharge Plan: Home/Self Care       Living arrangements for the past 2 months: Single Family Home                                      Prior Living Arrangements/Services Living arrangements for the past 2 months: Single Family Home Lives with:: Spouse                   Activities of Daily Living Home Assistive Devices/Equipment: Dentures (specify type), Eyeglasses ADL Screening (condition at time of admission) Patient's cognitive ability adequate to safely complete daily activities?: Yes Is the patient deaf or have difficulty hearing?: No Does the patient have difficulty seeing, even when wearing glasses/contacts?: No Does the patient have difficulty concentrating, remembering, or making decisions?: No Patient able to express need for assistance with ADLs?: Yes Does the patient have difficulty dressing or bathing?: No Independently performs ADLs?: Yes  (appropriate for developmental age) Does the patient have difficulty walking or climbing stairs?: No Weakness of Legs: None Weakness of Arms/Hands: None  Permission Sought/Granted                  Emotional Assessment   Attitude/Demeanor/Rapport: Gracious Affect (typically observed): Calm Orientation: : Oriented to Self, Oriented to Place, Oriented to  Time, Oriented to Situation Alcohol / Substance Use: Not Applicable Psych Involvement: No (comment)  Admission diagnosis:  NSTEMI (non-ST elevated myocardial infarction) ([place]) [I21.4] Near syncope [R55] COVID-19 [U07.1] Patient Active Problem List   Diagnosis Date Noted   Coronary artery disease involving native coronary artery of native heart without angina pectoris    Acute on chronic diastolic CHF (congestive heart failure) ([place]) 07/08/2020   Atherosclerosis of native coronary artery of native heart with stable angina pectoris ([place]) 06/12/2018   Chest pain 06/05/2018   NSTEMI (non-ST elevated myocardial infarction) ([place])    Weakness 02/13/2018   Chronic postoperative pain 04/13/2017   Lumbar post-laminectomy syndrome 04/13/2017   Antiplatelet or antithrombotic long-term use 04/11/2017   Neuropathy 09/21/2016   Chronic, continuous use of opioids 05/27/2016   Erectile dysfunction 01/01/2016   BPH with obstruction/lower urinary tract symptoms 01/01/2016   Pneumonia 11/03/2015   Nocturia 10/02/2015   Erectile dysfunction of organic origin 10/02/2015   Angina pectoris ([place]) 09/23/2015   Thrombocytopenia ([place])  09/23/2015   B12 deficiency 05/26/2015   Chronic idiopathic thrombocytopenia ([place]) 02/10/2015   Anemia 05/08/2014   Diabetes mellitus type 2, uncomplicated ([place]) 66/02/44   DDD (degenerative disc disease) 05/08/2014   Elevated PSA 05/08/2014   H/O ulcer disease 05/08/2014   History of kidney stones 05/08/2014   Hx of pulmonary embolus 05/08/2014   Hypertension 05/08/2014   Kidney stones 05/08/2014   Obesity  05/08/2014   Spinal stenosis 05/08/2014   Chronic back pain 06/21/2013   Diabetes mellitus ([place]) 06/20/2012   Hyperlipidemia 05/28/2010   Coronary atherosclerosis 05/28/2010   ATRIAL FIBRILLATION 05/28/2010   PCP:  Baxter Hire, MD Pharmacy:   [id number] [place], [address] [place] [address] Phone: [phone] Fax: [phone]     Social Determinants of Health (SDOH) Interventions    Readmission Risk Interventions Readmission Risk Prevention Plan 07/09/2020  Transportation Screening Complete  PCP or Specialist Appt within 3-5 Days Complete  HRI or Home Care Consult Complete  Social Work Consult for [place] Planning/Counseling Complete  Palliative Care Screening Complete  Medication Review Press photographer) Complete  Some recent data might be hidden

## 2021-08-24 NOTE — Progress Notes (Signed)
PROGRESS NOTE    Cody Price  QAS:[id number] DOB: 1941/05/27 DOA: 08/22/2021 PCP: Baxter Hire, MD   Brief Narrative: Taken from H&P. Cody Price is a 80 y.o. male with medical history significant for coronary artery disease, type 2 diabetes mellitus, GERD, hiatal hernia, dyslipidemia, hypertension, PE and sleep apnea, who presented to the ER with acute onset of chest pain that started last night in the midsternal area and felt as pressure, graded 4-5/10 in severity with no radiation. Patient has been vaccinated for COVID-19 but never received any booster. Apparently whole family is sick for the past 2-week.  Found to be positive for COVID-19.  Troponin elevated and continue to increase, chest x-ray with cardiomegaly and trace pleural effusion. EKG with atrial fibrillation, controlled ventricular response with PVCs and anteroseptal Q waves. He was started on heparin infusion for concern of NSTEMI Cardiology was consulted-still pending consult. Troponin peaked at [place], now trending down.  Echocardiogram with low normal EF and no regional wall motion abnormalities.  Cardiology consult still pending  Subjective: Patient was quite frustrated when seen today that he has not seen a cardiologist yet. Per patient he is still experiencing sharp intermittent chest pains.  Assessment & Plan:   Principal Problem:   NSTEMI (non-ST elevated myocardial infarction) ([place])  NSTEMI.  Troponin peaked at [place], not trending down, is concerning for NSTEMI.  Patient has extensive history of CAD, s/p 3 stent placement, last was placed in 2021.  Echocardiogram was ordered and cardiology was consulted.  Per wife he was started on Eliquis by cardiology and they stopped aspirin and Plavix. Cardiology was consulted-will appreciate their recommendations -Continue heparin infusion-patient completed 48 hours of heparin infusion, echocardiogram without any regional wall motion abnormalities, plan was to stop  heparin today but then received message from pharmacy that cardiology is planning to do cardiac catheterization later in the week, no documentation yet, so they will continue with heparin -Continue home dose of beta-blocker and statin -Eliquis will be restarted after stopping heparin.  Will appreciate cardiology input  COVID-19 infection.  Apparently symptoms for about 2 weeks.  CT value of 30.2.  D-dimer negative, CRP increasing -Continue with remdesivir to complete a 3-day course. -Continue with supportive care and supplements  Hypokalemia/hypomagnesemia.  Potassium of 3.3 with magnesium of 1.8 -Replete potassium and magnesium -Continue to monitor  Chronic HFrEF.  Appears euvolemic. -Repeat echocardiogram with low normal EF. -Continue home dose of Lasix  Essential hypertension. -Continue home dose of Toprol-XL and Zestril  Type 2 diabetes mellitus.  Takes Amaryl at home -SSI  BPH. -Continue home dose of Proscar  Dyslipidemia. -Continue statin  GERD. -Continue PPI  Objective: Vitals:   08/23/21 2045 08/24/21 0016 08/24/21 0408 08/24/21 1255  BP: (!) 115/59 (!) 106/59 122/63 106/70  Pulse: 82 77 (!) 102 75  Resp: 18 16 16 14   Temp: 97.7 F (36.5 C) 98.1 F (36.7 C) 97.6 F (36.4 C) 97.8 F (36.6 C)  TempSrc:      SpO2: 97% 97% 94% 98%  Weight:      Height:        Intake/Output Summary (Last 24 hours) at 08/24/2021 1630 Last data filed at 08/24/2021 0900 Gross per 24 hour  Intake 1137.64 ml  Output 1300 ml  Net -162.36 ml    Filed Weights   08/21/21 2207 08/22/21 0957 08/22/21 2213  Weight: 113.4 kg 113.4 kg 112.6 kg    Examination:  General.  Well-developed gentleman, in no acute distress. Pulmonary.  Lungs clear bilaterally, normal respiratory effort. CV.  Regular rate and rhythm, no JVD, rub or murmur. Abdomen.  Soft, nontender, nondistended, BS positive. CNS.  Alert and oriented .  No focal neurologic deficit. Extremities.  No edema, no cyanosis,  pulses intact and symmetrical. Psychiatry.  Judgment and insight appears normal.   DVT prophylaxis: Heparin infusion Code Status: DNR Family Communication:  Disposition Plan:  Status is: Inpatient  Remains inpatient appropriate because: Severity of illness  Level of care: Progressive  All the records are reviewed and case discussed with Care Management/Social Worker. Management plans discussed with the patient, nursing and they are in agreement.  Consultants:  Cardiology  Procedures:  Antimicrobials:   Data Reviewed: I have personally reviewed following labs and imaging studies  CBC: Recent Labs  Lab 08/21/21 2212 08/23/21 0555 08/24/21 0507  WBC 9.0 7.4 7.4  NEUTROABS  --  3.3 3.1  HGB 9.4* 8.6* 8.3*  HCT 29.0* 26.0* 25.6*  MCV 90.6 89.7 91.1  PLT 99* 84* 82*    Basic Metabolic Panel: Recent Labs  Lab 08/21/21 2212 08/22/21 0500 08/23/21 0555 08/24/21 0507  NA 134* 138 138 138  K 3.5 3.4* 3.2* 3.3*  CL 102 107 107 107  CO2 24 25 25 24   GLUCOSE 307* 172* 97 108*  BUN 12 12 10 12   CREATININE 1.10 0.86 0.92 0.89  CALCIUM 8.3* 7.7* 8.1* 8.0*  MG  --   --  1.5* 1.8    GFR: Estimated Creatinine Clearance: 88.5 mL/min (by C-G formula based on SCr of 0.89 mg/dL). Liver Function Tests: Recent Labs  Lab 08/22/21 0500 08/23/21 0555 08/24/21 0507  AST 18 15 12*  ALT 9 9 9   ALKPHOS 55 56 66  BILITOT 1.2 2.0* 1.7*  PROT 6.1* 6.1* 6.1*  ALBUMIN 2.9* 3.1* 2.9*    No results for input(s): LIPASE, AMYLASE in the last 168 hours. No results for input(s): AMMONIA in the last 168 hours. Coagulation Profile: Recent Labs  Lab 08/22/21 0257  INR 1.6*    Cardiac Enzymes: No results for input(s): CKTOTAL, CKMB, CKMBINDEX, TROPONINI in the last 168 hours. BNP (last 3 results) No results for input(s): PROBNP in the last 8760 hours. HbA1C: Recent Labs    08/22/21 1412  HGBA1C 6.6*   CBG: Recent Labs  Lab 08/23/21 1158 08/23/21 1712 08/23/21 2044  08/24/21 0927 08/24/21 1255  GLUCAP 153* 92 177* 143* 156*    Lipid Profile: Recent Labs    08/23/21 0555  CHOL 55  HDL 19*  LDLCALC 25  TRIG 56  CHOLHDL 2.9    Thyroid Function Tests: No results for input(s): TSH, T4TOTAL, FREET4, T3FREE, THYROIDAB in the last 72 hours. Anemia Panel: Recent Labs    08/23/21 0555 08/24/21 0507  FERRITIN 144 147    Sepsis Labs: Recent Labs  Lab 08/22/21 0500  PROCALCITON <0.10     Recent Results (from the past 240 hour(s))  Resp Panel by RT-PCR (Flu A&B, Covid) Nasopharyngeal Swab     Status: Abnormal   Collection Time: 08/21/21 10:12 PM   Specimen: Nasopharyngeal Swab; Nasopharyngeal(NP) swabs in vial transport medium  Result Value Ref Range Status   SARS Coronavirus 2 by RT PCR POSITIVE (A) NEGATIVE Final    Comment: (NOTE) SARS-CoV-2 target nucleic acids are DETECTED.  The SARS-CoV-2 RNA is generally detectable in upper respiratory specimens during the acute phase of infection. Positive results are indicative of the presence of the identified virus, but do not rule out bacterial infection or  co-infection with other pathogens not detected by the test. Clinical correlation with patient history and other diagnostic information is necessary to determine patient infection status. The expected result is Negative.  Fact Sheet for Patients: EntrepreneurPulse.com.au  Fact Sheet for Healthcare Providers: IncredibleEmployment.be  This test is not yet approved or cleared by the [place] FDA and  has been authorized for detection and/or diagnosis of SARS-CoV-2 by FDA under an Emergency Use Authorization (EUA).  This EUA will remain in effect (meaning this test can be used) for the duration of  the COVID-19 declaration under Section 564(b)(1) of the A ct, 21 U.S.C. section 360bbb-3(b)(1), unless the authorization is terminated or revoked sooner.     Influenza A by PCR NEGATIVE NEGATIVE  Final   Influenza B by PCR NEGATIVE NEGATIVE Final    Comment: (NOTE) The Xpert Xpress SARS-CoV-2/FLU/RSV plus assay is intended as an aid in the diagnosis of influenza from Nasopharyngeal swab specimens and should not be used as a sole basis for treatment. Nasal washings and aspirates are unacceptable for Xpert Xpress SARS-CoV-2/FLU/RSV testing.  Fact Sheet for Patients: EntrepreneurPulse.com.au  Fact Sheet for Healthcare Providers: IncredibleEmployment.be  This test is not yet approved or cleared by the [place] FDA and has been authorized for detection and/or diagnosis of SARS-CoV-2 by FDA under an Emergency Use Authorization (EUA). This EUA will remain in effect (meaning this test can be used) for the duration of the COVID-19 declaration under Section 564(b)(1) of the Act, 21 U.S.C. section 360bbb-3(b)(1), unless the authorization is terminated or revoked.  Performed at [facility], [address], [place], [place] [address]       Radiology Studies: ECHOCARDIOGRAM COMPLETE  Result Date: 08/24/2021    ECHOCARDIOGRAM REPORT   Patient Name:   Cody Price Date of Exam: 08/24/2021 Medical Rec #:  [id number]      Height:       73.0 in Accession #:    [id number]     Weight:       248.2 lb Date of Birth:  [birth year]       BSA:          2.358 m Patient Age:    34 years       BP:           122/63 mmHg Patient Gender: M              HR:           102 bpm. Exam Location:  [facility] Procedure: 2D Echo, Cardiac Doppler and Color Doppler Indications:     NSTEMI I21.4  History:         Patient has prior history of Echocardiogram examinations, most                  recent 07/08/2020. CAD and Previous Myocardial Infarction; Risk                  Factors:Diabetes and Hypertension.  Sonographer:     Sherrie Sport Referring Phys:  [id number] Diagnosing Phys: Kathlyn Sacramento MD  Sonographer Comments: Suboptimal apical window. IMPRESSIONS  1. Left  ventricular ejection fraction, by estimation, is 50 to 55%. The left ventricle has low normal function. The left ventricle has no regional wall motion abnormalities. The left ventricular internal cavity size was mildly dilated. There is mild left ventricular hypertrophy. Left ventricular diastolic parameters are indeterminate.  2. Right ventricular systolic function is normal. The right ventricular size is normal. There  is moderately elevated pulmonary artery systolic pressure. The estimated right ventricular systolic pressure is 62.7 mmHg.  3. Left atrial size was moderately dilated.  4. Right atrial size was moderately dilated.  5. The mitral valve is abnormal. Moderate mitral valve regurgitation. No evidence of mitral stenosis.  6. The aortic valve is calcified. Aortic valve regurgitation is not visualized. Aortic valve sclerosis/calcification is present, without any evidence of aortic stenosis. FINDINGS  Left Ventricle: Left ventricular ejection fraction, by estimation, is 50 to 55%. The left ventricle has low normal function. The left ventricle has no regional wall motion abnormalities. The left ventricular internal cavity size was mildly dilated. There is mild left ventricular hypertrophy. Left ventricular diastolic parameters are indeterminate. Right Ventricle: The right ventricular size is normal. No increase in right ventricular wall thickness. Right ventricular systolic function is normal. There is moderately elevated pulmonary artery systolic pressure. The tricuspid regurgitant velocity is 3.30 m/s, and with an assumed right atrial pressure of 10 mmHg, the estimated right ventricular systolic pressure is 03.5 mmHg. Left Atrium: Left atrial size was moderately dilated. Right Atrium: Right atrial size was moderately dilated. Pericardium: Trivial pericardial effusion is present. Mitral Valve: The mitral valve is abnormal. There is mild thickening of the mitral valve leaflet(s). Moderate mitral valve  regurgitation. No evidence of mitral valve stenosis. MV peak gradient, 9.5 mmHg. The mean mitral valve gradient is 3.0 mmHg. Tricuspid Valve: The tricuspid valve is normal in structure. Tricuspid valve regurgitation is mild . No evidence of tricuspid stenosis. Aortic Valve: The aortic valve is calcified. Aortic valve regurgitation is not visualized. Aortic valve sclerosis/calcification is present, without any evidence of aortic stenosis. Aortic valve mean gradient measures 2.0 mmHg. Aortic valve peak gradient measures 3.9 mmHg. Aortic valve area, by VTI measures 4.08 cm. Pulmonic Valve: The pulmonic valve was normal in structure. Pulmonic valve regurgitation is trivial. No evidence of pulmonic stenosis. Aorta: The aortic root is normal in size and structure. Venous: The inferior vena cava was not well visualized. IAS/Shunts: No atrial level shunt detected by color flow Doppler.  LEFT VENTRICLE PLAX 2D LVIDd:         5.50 cm LVIDs:         4.00 cm LV PW:         1.10 cm LV IVS:        1.15 cm LVOT diam:     2.00 cm LV SV:         64 LV SV Index:   27 LVOT Area:     3.14 cm  RIGHT VENTRICLE RV Basal diam:  4.00 cm RV S prime:     12.20 cm/s TAPSE (M-mode): 3.5 cm LEFT ATRIUM              Index        RIGHT ATRIUM           Index LA diam:        4.40 cm  1.87 cm/m   RA Area:     30.00 cm LA Vol (A2C):   145.0 ml 61.48 ml/m  RA Volume:   110.00 ml 46.64 ml/m LA Vol (A4C):   101.0 ml 42.83 ml/m LA Biplane Vol: 127.0 ml 53.85 ml/m  AORTIC VALVE                    PULMONIC VALVE AV Area (Vmax):    3.28 cm     PV Vmax:        0.67 m/s  AV Area (Vmean):   3.41 cm     PV Vmean:       47.700 cm/s AV Area (VTI):     4.08 cm     PV VTI:         0.124 m AV Vmax:           98.80 cm/s   PV Peak grad:   1.8 mmHg AV Vmean:          63.050 cm/s  PV Mean grad:   1.0 mmHg AV VTI:            0.156 m      RVOT Peak grad: 2 mmHg AV Peak Grad:      3.9 mmHg AV Mean Grad:      2.0 mmHg LVOT Vmax:         103.00 cm/s LVOT Vmean:         68.500 cm/s LVOT VTI:          0.203 m LVOT/AV VTI ratio: 1.30  AORTA Ao Root diam: 3.53 cm MITRAL VALVE                TRICUSPID VALVE MV Area (PHT): 3.72 cm     TR Peak grad:   43.6 mmHg MV Area VTI:   1.94 cm     TR Vmax:        330.00 cm/s MV Peak grad:  9.5 mmHg MV Mean grad:  3.0 mmHg     SHUNTS MV Vmax:       1.54 m/s     Systemic VTI:  0.20 m MV Vmean:      75.8 cm/s    Systemic Diam: 2.00 cm MV Decel Time: 204 msec     Pulmonic VTI:  0.124 m MV E velocity: 112.00 cm/s Kathlyn Sacramento MD Electronically signed by Kathlyn Sacramento MD Signature Date/Time: 08/24/2021/9:21:41 AM    Final     Scheduled Meds:  vitamin C  500 mg Oral Daily   atorvastatin  40 mg Oral Daily   cholecalciferol  1,000 Units Oral Daily   cyanocobalamin  1,000 mcg Intramuscular Q30 days   finasteride  5 mg Oral Daily   furosemide  40 mg Oral BID   glimepiride  4 mg Oral BID   guaiFENesin  600 mg Oral BID   insulin aspart  0-15 Units Subcutaneous TID AC & HS   lisinopril  5 mg Oral Daily   metoprolol succinate  50 mg Oral Q breakfast   multivitamin with minerals  1 tablet Oral Daily   omega-3 acid ethyl esters  2 g Oral Daily   pantoprazole  40 mg Oral Daily   potassium chloride SA  10 mEq Oral Daily   tamsulosin  0.4 mg Oral Daily   zinc sulfate  220 mg Oral Daily   Continuous Infusions:  sodium chloride 100 mL/hr at 08/23/21 2119   heparin 1,500 Units/hr (08/23/21 1800)     LOS: 2 days   Time spent: 38 minutes. More than 50% of the time was spent in counseling/coordination of care  Lorella Nimrod, MD [facility]  If 7PM-7AM, please contact night-coverage [email or web address]  08/24/2021, 4:30 PM   This record has been created using Systems analyst. Errors have been sought and corrected,but may not always be located. Such creation errors do not reflect on the standard of care.

## 2021-08-24 NOTE — Progress Notes (Signed)
*  PRELIMINARY RESULTS* Echocardiogram 2D Echocardiogram has been performed.  Cody Price 08/24/2021, 8:40 AM

## 2021-08-24 NOTE — Progress Notes (Signed)
[place] for heparin infusion Indication: NSTEMI   Allergies  Allergen Reactions   Novocain [Procaine] Other (See Comments)    Syncope   Procaine Hcl Other (See Comments)    Passes out   Lyrica [Pregabalin]    Neurontin [Gabapentin]    Amitriptyline Other (See Comments)    Sleepy   Dicyclomine Other (See Comments)    Other reaction(s): Abdominal Pain   Metformin Nausea Only   Nitroglycerin Other (See Comments)    Makes Blood pressure go to low.     Patient Measurements: Height: 6\' 1"  (185.4 cm) Weight: 112.6 kg (248 lb 3.2 oz) IBW/kg (Calculated) : 79.9 Heparin Dosing Weight: 106 kg  Vital Signs: Temp: 98.1 F (36.7 C) (12/19 0016) Temp Source: Oral (12/18 1950) BP: 106/59 (12/19 0016) Pulse Rate: 77 (12/19 0016)  Labs: Recent Labs    08/21/21 2212 08/22/21 0100 08/22/21 0257 08/22/21 0500 08/22/21 0613 08/22/21 1630 08/22/21 2202 08/23/21 0555 08/23/21 1551 08/23/21 2356  HGB 9.4*  --   --   --   --   --   --  8.6*  --   --   HCT 29.0*  --   --   --   --   --   --  26.0*  --   --   PLT 99*  --   --   --   --   --   --  84*  --   --   APTT  --   --  56*  --    < >  --   --  65* 81* 72*  LABPROT  --   --  19.4*  --   --   --   --   --   --   --   INR  --   --  1.6*  --   --   --   --   --   --   --   HEPARINUNFRC  --   --  >1.10*  --   --   --   --  0.84*  --  0.69  CREATININE 1.10  --   --  0.86  --   --   --  0.92  --   --   TROPONINIHS 86*   < >  --  1,894*  --  1,244* 1,082* 877*  --   --    < > = values in this interval not displayed.     Estimated Creatinine Clearance: 85.6 mL/min (by C-G formula based on SCr of 0.92 mg/dL).   Medical History: Past Medical History:  Diagnosis Date   Arthritis    degenerative back    BPH with obstruction/lower urinary tract symptoms    CAD (coronary artery disease) 2010   stent   Diabetes mellitus    ED (erectile dysfunction)    Elevated PSA    GERD (gastroesophageal  reflux disease)    Gross hematuria    H/O CT scan    recent renal CT due to hematuria, cystoscopy - normal     Hematuria    History of hiatal hernia    History of kidney stones    HLD (hyperlipidemia)    Hypertension    Ischemic heart disease 1991   with angioplasty   Kidney stone    Morbid obesity ([place])    Myocardial infarction ([place])    Peripheral neuropathy    Pernicious anemia    Pneumonia 1995   /  w PE,    Pulmonary embolism ([place])    previous   Renal cyst    Sleep apnea    pt. denies sleep apnea, pt. states he has had 2 studies - last one 2011   Spinal stenosis     Medications:  PTA Meds:  Eliquis 5 mg BID, last dose unknown  Assessment: Pt is 80 yo male medical history significant for coronary artery disease, type 2 diabetes mellitus, GERD, hiatal hernia, dyslipidemia, hypertension, PE and sleep apnea, who presented to the ER with acute onset of chest pain that started last night in the midsternal area and felt as pressure, graded 4-5/10 in severity with no radiation. Pharmacy consulted to start heparin. Pt takes apixaban PTA. Will use aPTT monitoring due to false elevation in anti-xa levels.   12/17 0257 HL > 1.1 (Heparin infusion started 1217 @0305 ) 12/17 0613 aPTT 71.  12/17 1412 aPTT 69 12/18 0555 aPTT 65, slightly subtherapeutic, HL 0.84 12/18 1551 aPTT 81 12/18 2356 aPTT 72, therapeutic, HL 0.69  Goal of Therapy:  Heparin level 0.3-0.7 units/ml once aPTT and heparin level correlate.  aPTT 66-102 seconds Monitor platelets by anticoagulation protocol: Yes   Plan:  Both aPTT & HL therapeutic and starting to correlate.  Continue heparin infusion at 1500 units/hr. Recheck aPTT/HL with AM labs to confirm correlation. CBC and heparin level daily while on heparin.   Renda Rolls, PharmD, [facility] 08/24/2021 12:37 AM

## 2021-08-24 NOTE — Plan of Care (Signed)
  Problem: Clinical Measurements: Goal: Ability to maintain clinical measurements within normal limits will improve Outcome: Progressing   Problem: Clinical Measurements: Goal: Will remain free from infection Outcome: Progressing   Problem: Clinical Measurements: Goal: Diagnostic test results will improve Outcome: Progressing   Problem: Clinical Measurements: Goal: Respiratory complications will improve Outcome: Progressing   

## 2021-08-24 NOTE — Progress Notes (Signed)
[place] for heparin infusion Indication: NSTEMI   Allergies  Allergen Reactions   Novocain [Procaine] Other (See Comments)    Syncope   Procaine Hcl Other (See Comments)    Passes out   Lyrica [Pregabalin]    Neurontin [Gabapentin]    Amitriptyline Other (See Comments)    Sleepy   Dicyclomine Other (See Comments)    Other reaction(s): Abdominal Pain   Metformin Nausea Only   Nitroglycerin Other (See Comments)    Makes Blood pressure go to low.     Patient Measurements: Height: 6\' 1"  (185.4 cm) Weight: 112.6 kg (248 lb 3.2 oz) IBW/kg (Calculated) : 79.9 Heparin Dosing Weight: 106 kg  Vital Signs: Temp: 97.6 F (36.4 C) (12/19 0408) Temp Source: Oral (12/18 1950) BP: 122/63 (12/19 0408) Pulse Rate: 102 (12/19 0408)  Labs: Recent Labs    08/21/21 2212 08/22/21 0100 08/22/21 0257 08/22/21 0500 08/22/21 0613 08/22/21 1630 08/22/21 2202 08/23/21 0555 08/23/21 1551 08/23/21 2356 08/24/21 0507  HGB 9.4*  --   --   --   --   --   --  8.6*  --   --  8.3*  HCT 29.0*  --   --   --   --   --   --  26.0*  --   --  25.6*  PLT 99*  --   --   --   --   --   --  84*  --   --  82*  APTT  --   --  56*  --    < >  --   --  65* 81* 72* 79*  LABPROT  --   --  19.4*  --   --   --   --   --   --   --   --   INR  --   --  1.6*  --   --   --   --   --   --   --   --   HEPARINUNFRC  --    < > >1.10*  --   --   --   --  0.84*  --  0.69 0.59  CREATININE 1.10  --   --  0.86  --   --   --  0.92  --   --  0.89  TROPONINIHS 86*   < >  --  1,894*  --  1,244* 1,082* 877*  --   --   --    < > = values in this interval not displayed.     Estimated Creatinine Clearance: 88.5 mL/min (by C-G formula based on SCr of 0.89 mg/dL).   Medical History: Past Medical History:  Diagnosis Date   Arthritis    degenerative back    BPH with obstruction/lower urinary tract symptoms    CAD (coronary artery disease) 2010   stent   Diabetes mellitus    ED  (erectile dysfunction)    Elevated PSA    GERD (gastroesophageal reflux disease)    Gross hematuria    H/O CT scan    recent renal CT due to hematuria, cystoscopy - normal     Hematuria    History of hiatal hernia    History of kidney stones    HLD (hyperlipidemia)    Hypertension    Ischemic heart disease 1991   with angioplasty   Kidney stone    Morbid obesity ([place])    Myocardial infarction (  [place])    Peripheral neuropathy    Pernicious anemia    Pneumonia 1995   /w PE,    Pulmonary embolism ([facility])    previous   Renal cyst    Sleep apnea    pt. denies sleep apnea, pt. states he has had 2 studies - last one 2011   Spinal stenosis     Medications:  PTA Meds:  Eliquis 5 mg BID, last dose unknown Heparin Dosing Weight: 106 kg  Assessment: Pt is 80 yo male medical history significant for coronary artery disease, type 2 diabetes mellitus, GERD, hiatal hernia, dyslipidemia, hypertension, PE and sleep apnea, who presented to the ER with acute onset of chest pain that started last night in the midsternal area and felt as pressure, graded 4-5/10 in severity with no radiation. Pharmacy consulted to start heparin. Pt takes apixaban PTA. Will use aPTT monitoring due to false elevation in anti-xa levels.   12/17 0257 HL > 1.1 (Heparin infusion started 1217 @0305 ) 12/17 0613 aPTT 71.  12/17 1412 aPTT 69 12/18 0555 aPTT 65, slightly subtherapeutic, HL 0.84 12/18 1551 aPTT 81 12/18 2356 aPTT 72, therapeutic, HL 0.69 12/19 0507 aPTT 79, therapeutic, HL 0.59  Goal of Therapy:  Heparin level 0.3-0.7 units/ml once aPTT and heparin level correlate.  aPTT 66-102 seconds Monitor platelets by anticoagulation protocol: Yes   Plan:  Both aPTT & HL are therapeutic x2 and correlating at current rate. Titration by HL alone hereafter. Continue heparin infusion at 1500 units/hr.  Recheck HL at lease daily with AM labs since consecutively therapeutic. CBC daily while on heparin.   Lorna Dibble, PharmD, [facility] Clinical Pharmacist 08/24/2021 7:29 AM

## 2021-08-25 ENCOUNTER — Ambulatory Visit: Payer: Medicare HMO | Admitting: Student in an Organized Health Care Education/Training Program

## 2021-08-25 LAB — COMPREHENSIVE METABOLIC PANEL
ALT: 9 U/L (ref 0–44)
AST: 13 U/L — ABNORMAL LOW (ref 15–41)
Albumin: 3 g/dL — ABNORMAL LOW (ref 3.5–5.0)
Alkaline Phosphatase: 65 U/L (ref 38–126)
Anion gap: 7 (ref 5–15)
BUN: 12 mg/dL (ref 8–23)
CO2: 24 mmol/L (ref 22–32)
Calcium: 8.2 mg/dL — ABNORMAL LOW (ref 8.9–10.3)
Chloride: 107 mmol/L (ref 98–111)
Creatinine, Ser: 0.91 mg/dL (ref 0.61–1.24)
GFR, Estimated: >60 mL/min (ref >60)
Glucose, Bld: 111 mg/dL — ABNORMAL HIGH (ref 70–99)
Potassium: 3.8 mmol/L (ref 3.5–5.1)
Sodium: 138 mmol/L (ref 135–145)
Total Bilirubin: 1.7 mg/dL — ABNORMAL HIGH (ref 0.3–1.2)
Total Protein: 6.1 g/dL — ABNORMAL LOW (ref 6.5–8.1)

## 2021-08-25 LAB — CBC WITH DIFFERENTIAL/PLATELET
Abs Immature Granulocytes: 0 10*3/uL (ref 0.00–0.07)
Basophils Absolute: 0 10*3/uL (ref 0.0–0.1)
Basophils Relative: 0 %
Eosinophils Absolute: 0.2 10*3/uL (ref 0.0–0.5)
Eosinophils Relative: 2 %
HCT: 26.2 % — ABNORMAL LOW (ref 39.0–52.0)
Hemoglobin: 8.3 g/dL — ABNORMAL LOW (ref 13.0–17.0)
Lymphocytes Relative: 13 %
Lymphs Abs: 1 10*3/uL (ref 0.7–4.0)
MCH: 28.9 pg (ref 26.0–34.0)
MCHC: 31.7 g/dL (ref 30.0–36.0)
MCV: 91.3 fL (ref 80.0–100.0)
Monocytes Absolute: 1.8 10*3/uL — ABNORMAL HIGH (ref 0.1–1.0)
Monocytes Relative: 23 %
Neutro Abs: 4.9 10*3/uL (ref 1.7–7.7)
Neutrophils Relative %: 62 %
Platelets: 78 10*3/uL — ABNORMAL LOW (ref 150–400)
RBC: 2.87 MIL/uL — ABNORMAL LOW (ref 4.22–5.81)
RDW: 16.1 % — ABNORMAL HIGH (ref 11.5–15.5)
Smear Review: NORMAL
WBC: 7.9 10*3/uL (ref 4.0–10.5)
nRBC: 0 % (ref 0.0–0.2)

## 2021-08-25 LAB — MAGNESIUM: Magnesium: 1.9 mg/dL (ref 1.7–2.4)

## 2021-08-25 LAB — GLUCOSE, CAPILLARY
Glucose-Capillary: 99 mg/dL (ref 70–99)
Glucose-Capillary: 159 mg/dL — ABNORMAL HIGH (ref 70–99)
Glucose-Capillary: 157 mg/dL — ABNORMAL HIGH (ref 70–99)
Glucose-Capillary: 149 mg/dL — ABNORMAL HIGH (ref 70–99)

## 2021-08-25 LAB — FERRITIN: Ferritin: 148 ng/mL (ref 24–336)

## 2021-08-25 LAB — HEMOGLOBIN A1C
Hgb A1c MFr Bld: 6.6 % — ABNORMAL HIGH (ref 4.8–5.6)
Mean Plasma Glucose: 143 mg/dL

## 2021-08-25 LAB — D-DIMER, QUANTITATIVE: D-Dimer, Quant: 0.5 ug/mL-FEU (ref 0.00–0.50)

## 2021-08-25 LAB — C-REACTIVE PROTEIN: CRP: 5.7 mg/dL — ABNORMAL HIGH (ref <1.0)

## 2021-08-25 LAB — HEPARIN LEVEL (UNFRACTIONATED): Heparin Unfractionated: 0.31 IU/mL (ref 0.30–0.70)

## 2021-08-25 MED ORDER — SODIUM CHLORIDE 0.9% FLUSH
3.0000 mL | Freq: Two times a day (BID) | INTRAVENOUS | Status: DC
  Administered 2021-08-25 – 2021-08-29 (×7): 3 mL via INTRAVENOUS

## 2021-08-25 MED ORDER — SODIUM CHLORIDE 0.9% FLUSH
3.0000 mL | INTRAVENOUS | Status: DC | PRN
  Administered 2021-08-27: 08:00:00 3 mL via INTRAVENOUS

## 2021-08-25 MED ORDER — ASPIRIN 81 MG PO CHEW
81.0000 mg | CHEWABLE_TABLET | ORAL | Status: AC
  Administered 2021-08-26: 06:00:00 81 mg via ORAL
  Filled 2021-08-25: qty 1

## 2021-08-25 MED ORDER — FUROSEMIDE 10 MG/ML IJ SOLN
60.0000 mg | Freq: Once | INTRAMUSCULAR | Status: AC
  Administered 2021-08-25: 17:00:00 60 mg via INTRAVENOUS
  Filled 2021-08-25: qty 6

## 2021-08-25 MED ORDER — SODIUM CHLORIDE 0.9 % IV SOLN: INTRAVENOUS | Status: DC

## 2021-08-25 MED ORDER — ASPIRIN EC 81 MG PO TBEC
81.0000 mg | DELAYED_RELEASE_TABLET | Freq: Every day | ORAL | Status: DC
  Administered 2021-08-25 – 2021-08-29 (×5): 81 mg via ORAL
  Filled 2021-08-25 (×5): qty 1

## 2021-08-25 MED ORDER — SODIUM CHLORIDE 0.9 % IV SOLN: 250.0000 mL | INTRAVENOUS | Status: DC | PRN

## 2021-08-25 NOTE — Progress Notes (Signed)
PROGRESS NOTE    KEMAL AMORES  ZTI:[id number] DOB: 1941-01-19 DOA: 08/22/2021 PCP: Baxter Hire, MD   Brief Narrative: Taken from H&P. Cody Price is a 80 y.o. male with medical history significant for coronary artery disease, type 2 diabetes mellitus, GERD, hiatal hernia, dyslipidemia, hypertension, PE and sleep apnea, who presented to the ER with acute onset of chest pain that started last night in the midsternal area and felt as pressure, graded 4-5/10 in severity with no radiation. Patient has been vaccinated for COVID-19 but never received any booster. Apparently whole family is sick for the past 2-week.  Found to be positive for COVID-19.  Troponin elevated and continue to increase, chest x-ray with cardiomegaly and trace pleural effusion. EKG with atrial fibrillation, controlled ventricular response with PVCs and anteroseptal Q waves. He was started on heparin infusion for concern of NSTEMI Cardiology was consulted-still pending consult. Troponin peaked at [place], now trending down.  Echocardiogram with low normal EF and no regional wall motion abnormalities.  Cardiology is planning to do cardiac catheterization tomorrow on 08/26/2021 and advising to continue with heparin infusion.  Subjective: Patient was seen and examined today.  Denies any chest pain.  Assessment & Plan:   Principal Problem:   NSTEMI (non-ST elevated myocardial infarction) ([place])  NSTEMI.  Troponin peaked at [place], not trending down, is concerning for NSTEMI.  Patient has extensive history of CAD, s/p 3 stent placement, last was placed in 2021.  Echocardiogram was ordered and cardiology was consulted.  Per wife he was started on Eliquis by cardiology and they stopped aspirin and Plavix. Cardiology was consulted-cardiac catheterization is being planned for tomorrow. - echocardiogram without any regional wall motion abnormalities,  -Continue holding Eliquis and continue with heparin infusion per  cardiology -Continue home dose of beta-blocker and statin -Eliquis will be restarted after stopping heparin.    COVID-19 infection.  Apparently symptoms for about 2 weeks.  CT value of 30.2.  D-dimer negative, CRP increasing.  Remained on room air -Continue with remdesivir to complete a 3-day course. -Continue with supportive care and supplements  Hypokalemia/hypomagnesemia.  Resolved -Replete electrolytes as needed.  Chronic HFrEF.  Appears euvolemic. -Repeat echocardiogram with low normal EF. -Continue home dose of Lasix  Essential hypertension. -Continue home dose of Toprol-XL and Zestril  Type 2 diabetes mellitus.  Takes Amaryl at home -SSI  BPH. -Continue home dose of Proscar  Dyslipidemia. -Continue statin  GERD. -Continue PPI  Objective: Vitals:   08/25/21 0008 08/25/21 0426 08/25/21 0831 08/25/21 1102  BP: 119/65 117/80 125/76 (!) 109/58  Pulse: 78 88 78 70  Resp: 18 16 18 15   Temp: 98.1 F (36.7 C) 98 F (36.7 C) 98 F (36.7 C) 97.9 F (36.6 C)  TempSrc: Oral Oral Oral Oral  SpO2: 95% 96% 98% 98%  Weight:      Height:        Intake/Output Summary (Last 24 hours) at 08/25/2021 1432 Last data filed at 08/25/2021 0830 Gross per 24 hour  Intake 2002.21 ml  Output 700 ml  Net 1302.21 ml    Filed Weights   08/21/21 2207 08/22/21 0957 08/22/21 2213  Weight: 113.4 kg 113.4 kg 112.6 kg    Examination:  General.  Well-developed gentleman, in no acute distress. Pulmonary.  Lungs clear bilaterally, normal respiratory effort. CV.  Regular rate and rhythm, no JVD, rub or murmur. Abdomen.  Soft, nontender, nondistended, BS positive. CNS.  Alert and oriented .  No focal neurologic deficit. Extremities.  No edema, no cyanosis, pulses intact and symmetrical. Psychiatry.  Judgment and insight appears normal.    DVT prophylaxis: Heparin infusion Code Status: DNR Family Communication:  Disposition Plan:  Status is: Inpatient  Remains inpatient  appropriate because: Severity of illness  Level of care: Progressive  All the records are reviewed and case discussed with Care Management/Social Worker. Management plans discussed with the patient, nursing and they are in agreement.  Consultants:  Cardiology  Procedures:  Antimicrobials:   Data Reviewed: I have personally reviewed following labs and imaging studies  CBC: Recent Labs  Lab 08/21/21 2212 08/23/21 0555 08/24/21 0507 08/25/21 0623  WBC 9.0 7.4 7.4 7.9  NEUTROABS  --  3.3 3.1 4.9  HGB 9.4* 8.6* 8.3* 8.3*  HCT 29.0* 26.0* 25.6* 26.2*  MCV 90.6 89.7 91.1 91.3  PLT 99* 84* 82* 78*    Basic Metabolic Panel: Recent Labs  Lab 08/21/21 2212 08/22/21 0500 08/23/21 0555 08/24/21 0507 08/25/21 0623  NA 134* 138 138 138 138  K 3.5 3.4* 3.2* 3.3* 3.8  CL 102 107 107 107 107  CO2 24 25 25 24 24   GLUCOSE 307* 172* 97 108* 111*  BUN 12 12 10 12 12   CREATININE 1.10 0.86 0.92 0.89 0.91  CALCIUM 8.3* 7.7* 8.1* 8.0* 8.2*  MG  --   --  1.5* 1.8 1.9    GFR: Estimated Creatinine Clearance: 86.6 mL/min (by C-G formula based on SCr of 0.91 mg/dL). Liver Function Tests: Recent Labs  Lab 08/22/21 0500 08/23/21 0555 08/24/21 0507 08/25/21 0623  AST 18 15 12* 13*  ALT 9 9 9 9   ALKPHOS 55 56 66 65  BILITOT 1.2 2.0* 1.7* 1.7*  PROT 6.1* 6.1* 6.1* 6.1*  ALBUMIN 2.9* 3.1* 2.9* 3.0*    No results for input(s): LIPASE, AMYLASE in the last 168 hours. No results for input(s): AMMONIA in the last 168 hours. Coagulation Profile: Recent Labs  Lab 08/22/21 0257  INR 1.6*    Cardiac Enzymes: No results for input(s): CKTOTAL, CKMB, CKMBINDEX, TROPONINI in the last 168 hours. BNP (last 3 results) No results for input(s): PROBNP in the last 8760 hours. HbA1C: No results for input(s): HGBA1C in the last 72 hours.  CBG: Recent Labs  Lab 08/24/21 1255 08/24/21 1720 08/24/21 2122 08/25/21 0834 08/25/21 1145  GLUCAP 156* 160* 113* 99 159*    Lipid  Profile: Recent Labs    08/23/21 0555  CHOL 55  HDL 19*  LDLCALC 25  TRIG 56  CHOLHDL 2.9    Thyroid Function Tests: No results for input(s): TSH, T4TOTAL, FREET4, T3FREE, THYROIDAB in the last 72 hours. Anemia Panel: Recent Labs    08/24/21 0507 08/25/21 0623  FERRITIN 147 148    Sepsis Labs: Recent Labs  Lab 08/22/21 0500  PROCALCITON <0.10     Recent Results (from the past 240 hour(s))  Resp Panel by RT-PCR (Flu A&B, Covid) Nasopharyngeal Swab     Status: Abnormal   Collection Time: 08/21/21 10:12 PM   Specimen: Nasopharyngeal Swab; Nasopharyngeal(NP) swabs in vial transport medium  Result Value Ref Range Status   SARS Coronavirus 2 by RT PCR POSITIVE (A) NEGATIVE Final    Comment: (NOTE) SARS-CoV-2 target nucleic acids are DETECTED.  The SARS-CoV-2 RNA is generally detectable in upper respiratory specimens during the acute phase of infection. Positive results are indicative of the presence of the identified virus, but do not rule out bacterial infection or co-infection with other pathogens not detected by the test. Clinical  correlation with patient history and other diagnostic information is necessary to determine patient infection status. The expected result is Negative.  Fact Sheet for Patients: EntrepreneurPulse.com.au  Fact Sheet for Healthcare Providers: IncredibleEmployment.be  This test is not yet approved or cleared by the [place] FDA and  has been authorized for detection and/or diagnosis of SARS-CoV-2 by FDA under an Emergency Use Authorization (EUA).  This EUA will remain in effect (meaning this test can be used) for the duration of  the COVID-19 declaration under Section 564(b)(1) of the A ct, 21 U.S.C. section 360bbb-3(b)(1), unless the authorization is terminated or revoked sooner.     Influenza A by PCR NEGATIVE NEGATIVE Final   Influenza B by PCR NEGATIVE NEGATIVE Final    Comment: (NOTE) The  Xpert Xpress SARS-CoV-2/FLU/RSV plus assay is intended as an aid in the diagnosis of influenza from Nasopharyngeal swab specimens and should not be used as a sole basis for treatment. Nasal washings and aspirates are unacceptable for Xpert Xpress SARS-CoV-2/FLU/RSV testing.  Fact Sheet for Patients: EntrepreneurPulse.com.au  Fact Sheet for Healthcare Providers: IncredibleEmployment.be  This test is not yet approved or cleared by the [place] FDA and has been authorized for detection and/or diagnosis of SARS-CoV-2 by FDA under an Emergency Use Authorization (EUA). This EUA will remain in effect (meaning this test can be used) for the duration of the COVID-19 declaration under Section 564(b)(1) of the Act, 21 U.S.C. section 360bbb-3(b)(1), unless the authorization is terminated or revoked.  Performed at [facility], [address] [place], [place] [address]       Radiology Studies: ECHOCARDIOGRAM COMPLETE  Result Date: 08/24/2021    ECHOCARDIOGRAM REPORT   Patient Name:   ADRYEN COOKSON Date of Exam: 08/24/2021 Medical Rec #:  [id number]      Height:       73.0 in Accession #:    [id number]     Weight:       248.2 lb Date of Birth:  [birth year]       BSA:          2.358 m Patient Age:    14 years       BP:           122/63 mmHg Patient Gender: M              HR:           102 bpm. Exam Location:  [facility] Procedure: 2D Echo, Cardiac Doppler and Color Doppler Indications:     NSTEMI I21.4  History:         Patient has prior history of Echocardiogram examinations, most                  recent 07/08/2020. CAD and Previous Myocardial Infarction; Risk                  Factors:Diabetes and Hypertension.  Sonographer:     Sherrie Sport Referring Phys:  [id number] [place] Diagnosing Phys: Kathlyn Sacramento MD  Sonographer Comments: Suboptimal apical window. IMPRESSIONS  1. Left ventricular ejection fraction, by estimation, is 50 to 55%. The left ventricle  has low normal function. The left ventricle has no regional wall motion abnormalities. The left ventricular internal cavity size was mildly dilated. There is mild left ventricular hypertrophy. Left ventricular diastolic parameters are indeterminate.  2. Right ventricular systolic function is normal. The right ventricular size is normal. There is moderately elevated pulmonary artery systolic pressure. The estimated right  ventricular systolic pressure is 22.9 mmHg.  3. Left atrial size was moderately dilated.  4. Right atrial size was moderately dilated.  5. The mitral valve is abnormal. Moderate mitral valve regurgitation. No evidence of mitral stenosis.  6. The aortic valve is calcified. Aortic valve regurgitation is not visualized. Aortic valve sclerosis/calcification is present, without any evidence of aortic stenosis. FINDINGS  Left Ventricle: Left ventricular ejection fraction, by estimation, is 50 to 55%. The left ventricle has low normal function. The left ventricle has no regional wall motion abnormalities. The left ventricular internal cavity size was mildly dilated. There is mild left ventricular hypertrophy. Left ventricular diastolic parameters are indeterminate. Right Ventricle: The right ventricular size is normal. No increase in right ventricular wall thickness. Right ventricular systolic function is normal. There is moderately elevated pulmonary artery systolic pressure. The tricuspid regurgitant velocity is 3.30 m/s, and with an assumed right atrial pressure of 10 mmHg, the estimated right ventricular systolic pressure is 79.8 mmHg. Left Atrium: Left atrial size was moderately dilated. Right Atrium: Right atrial size was moderately dilated. Pericardium: Trivial pericardial effusion is present. Mitral Valve: The mitral valve is abnormal. There is mild thickening of the mitral valve leaflet(s). Moderate mitral valve regurgitation. No evidence of mitral valve stenosis. MV peak gradient, 9.5 mmHg. The  mean mitral valve gradient is 3.0 mmHg. Tricuspid Valve: The tricuspid valve is normal in structure. Tricuspid valve regurgitation is mild . No evidence of tricuspid stenosis. Aortic Valve: The aortic valve is calcified. Aortic valve regurgitation is not visualized. Aortic valve sclerosis/calcification is present, without any evidence of aortic stenosis. Aortic valve mean gradient measures 2.0 mmHg. Aortic valve peak gradient measures 3.9 mmHg. Aortic valve area, by VTI measures 4.08 cm. Pulmonic Valve: The pulmonic valve was normal in structure. Pulmonic valve regurgitation is trivial. No evidence of pulmonic stenosis. Aorta: The aortic root is normal in size and structure. Venous: The inferior vena cava was not well visualized. IAS/Shunts: No atrial level shunt detected by color flow Doppler.  LEFT VENTRICLE PLAX 2D LVIDd:         5.50 cm LVIDs:         4.00 cm LV PW:         1.10 cm LV IVS:        1.15 cm LVOT diam:     2.00 cm LV SV:         64 LV SV Index:   27 LVOT Area:     3.14 cm  RIGHT VENTRICLE RV Basal diam:  4.00 cm RV S prime:     12.20 cm/s TAPSE (M-mode): 3.5 cm LEFT ATRIUM              Index        RIGHT ATRIUM           Index LA diam:        4.40 cm  1.87 cm/m   RA Area:     30.00 cm LA Vol (A2C):   145.0 ml 61.48 ml/m  RA Volume:   110.00 ml 46.64 ml/m LA Vol (A4C):   101.0 ml 42.83 ml/m LA Biplane Vol: 127.0 ml 53.85 ml/m  AORTIC VALVE                    PULMONIC VALVE AV Area (Vmax):    3.28 cm     PV Vmax:        0.67 m/s AV Area (Vmean):   3.41 cm  PV Vmean:       47.700 cm/s AV Area (VTI):     4.08 cm     PV VTI:         0.124 m AV Vmax:           98.80 cm/s   PV Peak grad:   1.8 mmHg AV Vmean:          63.050 cm/s  PV Mean grad:   1.0 mmHg AV VTI:            0.156 m      RVOT Peak grad: 2 mmHg AV Peak Grad:      3.9 mmHg AV Mean Grad:      2.0 mmHg LVOT Vmax:         103.00 cm/s LVOT Vmean:        68.500 cm/s LVOT VTI:          0.203 m LVOT/AV VTI ratio: 1.30  AORTA Ao Root  diam: 3.53 cm MITRAL VALVE                TRICUSPID VALVE MV Area (PHT): 3.72 cm     TR Peak grad:   43.6 mmHg MV Area VTI:   1.94 cm     TR Vmax:        330.00 cm/s MV Peak grad:  9.5 mmHg MV Mean grad:  3.0 mmHg     SHUNTS MV Vmax:       1.54 m/s     Systemic VTI:  0.20 m MV Vmean:      75.8 cm/s    Systemic Diam: 2.00 cm MV Decel Time: 204 msec     Pulmonic VTI:  0.124 m MV E velocity: 112.00 cm/s Kathlyn Sacramento MD Electronically signed by Kathlyn Sacramento MD Signature Date/Time: 08/24/2021/9:21:41 AM    Final     Scheduled Meds:  vitamin C  500 mg Oral Daily   atorvastatin  40 mg Oral Daily   cholecalciferol  1,000 Units Oral Daily   cyanocobalamin  1,000 mcg Intramuscular Q30 days   finasteride  5 mg Oral Daily   furosemide  40 mg Oral BID   glimepiride  4 mg Oral BID   guaiFENesin  600 mg Oral BID   insulin aspart  0-15 Units Subcutaneous TID AC & HS   lisinopril  5 mg Oral Daily   metoprolol succinate  50 mg Oral Q breakfast   multivitamin with minerals  1 tablet Oral Daily   omega-3 acid ethyl esters  2 g Oral Daily   pantoprazole  40 mg Oral Daily   potassium chloride SA  10 mEq Oral Daily   tamsulosin  0.4 mg Oral Daily   zinc sulfate  220 mg Oral Daily   Continuous Infusions:  sodium chloride 100 mL/hr at 08/25/21 0126   heparin 1,500 Units/hr (08/25/21 1058)     LOS: 3 days   Time spent: 39 minutes. More than 50% of the time was spent in counseling/coordination of care  Lorella Nimrod, MD [facility]  If 7PM-7AM, please contact night-coverage [email or web address]  08/25/2021, 2:32 PM   This record has been created using Systems analyst. Errors have been sought and corrected,but may not always be located. Such creation errors do not reflect on the standard of care.

## 2021-08-25 NOTE — H&P (View-Only) (Signed)
Progress Note  Patient Name: Cody Price Date of Encounter: 08/25/2021  Primary Cardiologist: Ida Rogue, MD  Subjective   C/o myalgias and worsening cough.  Pleuritic c/p in setting of coughing.  Hoping to be able to have cath tomorrow.  Inpatient Medications    Scheduled Meds:  vitamin C  500 mg Oral Daily   atorvastatin  40 mg Oral Daily   cholecalciferol  1,000 Units Oral Daily   cyanocobalamin  1,000 mcg Intramuscular Q30 days   finasteride  5 mg Oral Daily   furosemide  40 mg Oral BID   glimepiride  4 mg Oral BID   guaiFENesin  600 mg Oral BID   insulin aspart  0-15 Units Subcutaneous TID AC & HS   lisinopril  5 mg Oral Daily   metoprolol succinate  50 mg Oral Q breakfast   multivitamin with minerals  1 tablet Oral Daily   omega-3 acid ethyl esters  2 g Oral Daily   pantoprazole  40 mg Oral Daily   potassium chloride SA  10 mEq Oral Daily   tamsulosin  0.4 mg Oral Daily   zinc sulfate  220 mg Oral Daily   Continuous Infusions:  heparin 1,500 Units/hr (08/25/21 1058)   PRN Meds: oxyCODONE **AND** acetaminophen, acetaminophen, chlorpheniramine-HYDROcodone, diazepam, guaiFENesin-dextromethorphan, magnesium hydroxide, ondansetron **OR** ondansetron (ZOFRAN) IV, traZODone   Vital Signs    Vitals:   08/25/21 0426 08/25/21 0831 08/25/21 1102 08/25/21 1515  BP: 117/80 125/76 (!) 109/58 113/66  Pulse: 88 78 70 71  Resp: 16 18 15 20   Temp: 98 F (36.7 C) 98 F (36.7 C) 97.9 F (36.6 C) 98.5 F (36.9 C)  TempSrc: Oral Oral Oral   SpO2: 96% 98% 98% 97%  Weight:      Height:        Intake/Output Summary (Last 24 hours) at 08/25/2021 1547 Last data filed at 08/25/2021 0830 Gross per 24 hour  Intake 2002.21 ml  Output 700 ml  Net 1302.21 ml   Filed Weights   08/21/21 2207 08/22/21 0957 08/22/21 2213  Weight: 113.4 kg 113.4 kg 112.6 kg    Physical Exam   GEN: Well nourished, well developed, in no acute distress.  HEENT: Grossly normal.  Neck:  Supple, no JVD, carotid bruits, or masses. Cardiac: RRR, no murmurs, rubs, or gallops. No clubbing, cyanosis, edema.  Radials 2+, DP/PT 2+ and equal bilaterally.  Respiratory:  Respirations regular and unlabored, bibasilar crackles w/ otw diminished breath sounds. GI: Soft, nontender, nondistended, BS + x 4. MS: no deformity or atrophy. Skin: warm and dry, no rash. Neuro:  Strength and sensation are intact. Psych: AAOx3.  Normal affect.  Labs    Chemistry Recent Labs  Lab 08/23/21 0555 08/24/21 0507 08/25/21 0623  NA 138 138 138  K 3.2* 3.3* 3.8  CL 107 107 107  CO2 25 24 24   GLUCOSE 97 108* 111*  BUN 10 12 12   CREATININE 0.92 0.89 0.91  CALCIUM 8.1* 8.0* 8.2*  PROT 6.1* 6.1* 6.1*  ALBUMIN 3.1* 2.9* 3.0*  AST 15 12* 13*  ALT 9 9 9   ALKPHOS 56 66 65  BILITOT 2.0* 1.7* 1.7*  GFRNONAA >60 >60 >60  ANIONGAP 6 7 7      Hematology Recent Labs  Lab 08/23/21 0555 08/24/21 0507 08/25/21 0623  WBC 7.4 7.4 7.9  RBC 2.90* 2.81* 2.87*  HGB 8.6* 8.3* 8.3*  HCT 26.0* 25.6* 26.2*  MCV 89.7 91.1 91.3  MCH 29.7 29.5 28.9  MCHC 33.1 32.4 31.7  RDW 15.9* 16.0* 16.1*  PLT 84* 82* 78*    Cardiac Enzymes  Recent Labs  Lab 08/22/21 0100 08/22/21 0500 08/22/21 1630 08/22/21 2202 08/23/21 0555  TROPONINIHS 1,252* 1,894* 1,244* 1,082* 877*      BNP Recent Labs  Lab 08/22/21 0500  BNP 574.8*     DDimer  Recent Labs  Lab 08/23/21 0555 08/24/21 0507 08/25/21 0623  DDIMER 0.40 0.34 0.50     Lipids  Lab Results  Component Value Date   CHOL 55 08/23/2021   HDL 19 (L) 08/23/2021   LDLCALC 25 08/23/2021   TRIG 56 08/23/2021   CHOLHDL 2.9 08/23/2021    HbA1c  Lab Results  Component Value Date   HGBA1C 6.6 (H) 08/22/2021    Radiology    DG Chest 2 View  Result Date: 08/21/2021 CLINICAL DATA:  Cough and short of breath EXAM: CHEST - 2 VIEW COMPARISON:  07/08/2020 FINDINGS: Mild cardiomegaly. Trace pleural effusions. No focal opacity or pneumothorax. No  overt pulmonary edema. IMPRESSION: Cardiomegaly with trace pleural effusions. Electronically Signed   By: Donavan Foil M.D.   On: 08/21/2021 22:37   Telemetry    Afib, 70's to 80's - Personally Reviewed  Cardiac Studies   Cardiac Catheterization and Percutaneous Coronary Intervention 11.5.2021 Diagnostic Dominance: Right  Intervention   _____________  2D Echocardiogram 12.17.2022  1. Left ventricular ejection fraction, by estimation, is 50 to 55%. The  left ventricle has low normal function. The left ventricle has no regional  wall motion abnormalities. The left ventricular internal cavity size was  mildly dilated. There is mild  left ventricular hypertrophy. Left ventricular diastolic parameters are  indeterminate.   2. Right ventricular systolic function is normal. The right ventricular  size is normal. There is moderately elevated pulmonary artery systolic  pressure. The estimated right ventricular systolic pressure is 81.0 mmHg.   3. Left atrial size was moderately dilated.   4. Right atrial size was moderately dilated.   5. The mitral valve is abnormal. Moderate mitral valve regurgitation. No  evidence of mitral stenosis.   6. The aortic valve is calcified. Aortic valve regurgitation is not  visualized. Aortic valve sclerosis/calcification is present, without any  evidence of aortic stenosis.   Patient Profile     80 year old male with known history of coronary artery disease status post PCI, chronic atrial fibrillation on anticoagulation with Eliquis, type 2 diabetes, GERD, hyperlipidemia, essential hypertension, previous PE and sleep apnea, who was admitted 12/17 w/ resp failure, chest pain, and COVID19 infection, and ruled in for NSTEMI ([id number]  [id number]  1244  1082  877).  Assessment & Plan    1.  NSTEMI/CAD:  s/p prior PCI's w/ most recent PCI of mLAD in 07/2020 (CTO dRCA, mod LCX dzs).  Admitted w/ resp failure/chest pain/COVID-19 and HsTrop rose to 1894.  Eliquis on  hold.  Continues to experience myalgias and pleuritic c/p.  Feels that cough is worse today.  I will tentatively add him on for cath tomorrow afternoon, w/ a low threshold to cancel should his cough not improve.  He does have crackles on exam and w/ elev RVSP, I will provide a dose of IV lasix as some diuresis may improve his resp status/cough.  Will add ASA for now given NSTEMI w/ [place] on hold.  Cont statin/? blocker.  2.  Essential HTN:  stable on ? blocker and acei.  3.  HL:  LDL of 25.  Cont statin rx.  4.  Permanent Afib: Rate controlled 70's to 90's.  Cont ? blocker.  Eliquis on hold.  5.  DMII:  per IM.  A1c 6.6.  6.  HFpEF:  EF 50-55% by echo this admission w/ RVSP of 53.6.  If accurate, he is net + I/O for admission.  No recent wt.  HR/BP stable.  Bibasilar crackles on exam.  As above, will give a dose of IV lasix this afternoon, otw cont PO lasix.  7.  Normocytic anemia:  H/H remain low, but stable.  Signed, Murray Hodgkins, NP  08/25/2021, 3:47 PM    For questions or updates, please contact   Please consult [email or web address] for contact info under Cardiology/STEMI.

## 2021-08-25 NOTE — Progress Notes (Signed)
[place] for heparin infusion Indication: NSTEMI   Allergies  Allergen Reactions   Novocain [Procaine] Other (See Comments)    Syncope   Procaine Hcl Other (See Comments)    Passes out   Lyrica [Pregabalin]    Neurontin [Gabapentin]    Amitriptyline Other (See Comments)    Sleepy   Dicyclomine Other (See Comments)    Other reaction(s): Abdominal Pain   Metformin Nausea Only   Nitroglycerin Other (See Comments)    Makes Blood pressure go to low.     Patient Measurements: Height: 6\' 1"  (185.4 cm) Weight: 112.6 kg (248 lb 3.2 oz) IBW/kg (Calculated) : 79.9 Heparin Dosing Weight: 106 kg  Vital Signs: Temp: 98 F (36.7 C) (12/20 0426) Temp Source: Oral (12/20 0426) BP: 117/80 (12/20 0426) Pulse Rate: 88 (12/20 0426)  Labs: Recent Labs    08/22/21 1412 08/22/21 1630 08/22/21 2202 08/23/21 0555 08/23/21 1551 08/23/21 2356 08/24/21 0507 08/25/21 0623  HGB   < >  --   --  8.6*  --   --  8.3* 8.3*  HCT  --   --   --  26.0*  --   --  25.6* 26.2*  PLT  --   --   --  84*  --   --  82* 78*  APTT  --   --   --  65* 81* 72* 79*  --   HEPARINUNFRC   < >  --   --  0.84*  --  0.69 0.59 0.31  CREATININE  --   --   --  0.92  --   --  0.89  --   TROPONINIHS  --  1,244* 1,082* 877*  --   --   --   --    < > = values in this interval not displayed.     Estimated Creatinine Clearance: 88.5 mL/min (by C-G formula based on SCr of 0.89 mg/dL).   Medical History: Past Medical History:  Diagnosis Date   Arthritis    degenerative back    BPH with obstruction/lower urinary tract symptoms    CAD (coronary artery disease) 2010   stent   Diabetes mellitus    ED (erectile dysfunction)    Elevated PSA    GERD (gastroesophageal reflux disease)    Gross hematuria    H/O CT scan    recent renal CT due to hematuria, cystoscopy - normal     Hematuria    History of hiatal hernia    History of kidney stones    HLD (hyperlipidemia)     Hypertension    Ischemic heart disease 1991   with angioplasty   Kidney stone    Morbid obesity ([place])    Myocardial infarction ([place])    Peripheral neuropathy    Pernicious anemia    Pneumonia 1995   /w PE,    Pulmonary embolism ([facility])    previous   Renal cyst    Sleep apnea    pt. denies sleep apnea, pt. states he has had 2 studies - last one 2011   Spinal stenosis     Medications:  PTA Meds:  Eliquis 5 mg BID, last dose unknown Heparin Dosing Weight: 106 kg  Assessment: Pt is 80 yo male medical history significant for coronary artery disease, type 2 diabetes mellitus, GERD, hiatal hernia, dyslipidemia, hypertension, PE and sleep apnea, who presented to the ER with acute onset of chest pain that started last night in the  midsternal area and felt as pressure, graded 4-5/10 in severity with no radiation. Pharmacy consulted to start heparin. Pt takes apixaban PTA. Will use aPTT monitoring due to false elevation in anti-xa levels.   12/17 0257 HL > 1.1 (Heparin infusion started 1217 @0305 ) 12/17 0613 aPTT 71.  12/17 1412 aPTT 69 12/18 0555 aPTT 65, slightly subtherapeutic, HL 0.84 12/18 1551 aPTT 81 12/18 2356 aPTT 72, therapeutic, HL 0.69 12/19 0507 aPTT 79, therapeutic, HL 0.59 12/20 0623 HL 0.31; therapeutic @1500  un/hr  Goal of Therapy:  Heparin level 0.3-0.7 units/ml once aPTT and heparin level correlate.  aPTT 66-102 seconds Monitor platelets by anticoagulation protocol: Yes   Plan:  HL therapeutic at 0.31. Continue heparin infusion at 1500 units/hr.  Recheck HL at lease daily with AM labs since consecutively therapeutic. CBC daily while on heparin.   Lorna Dibble, PharmD, [facility] Clinical Pharmacist 08/25/2021 7:43 AM

## 2021-08-25 NOTE — Progress Notes (Signed)
Progress Note  Patient Name: Cody Price Date of Encounter: 08/25/2021  Primary Cardiologist: Ida Rogue, MD  Subjective   C/o myalgias and worsening cough.  Pleuritic c/p in setting of coughing.  Hoping to be able to have cath tomorrow.  Inpatient Medications    Scheduled Meds:  vitamin C  500 mg Oral Daily   atorvastatin  40 mg Oral Daily   cholecalciferol  1,000 Units Oral Daily   cyanocobalamin  1,000 mcg Intramuscular Q30 days   finasteride  5 mg Oral Daily   furosemide  40 mg Oral BID   glimepiride  4 mg Oral BID   guaiFENesin  600 mg Oral BID   insulin aspart  0-15 Units Subcutaneous TID AC & HS   lisinopril  5 mg Oral Daily   metoprolol succinate  50 mg Oral Q breakfast   multivitamin with minerals  1 tablet Oral Daily   omega-3 acid ethyl esters  2 g Oral Daily   pantoprazole  40 mg Oral Daily   potassium chloride SA  10 mEq Oral Daily   tamsulosin  0.4 mg Oral Daily   zinc sulfate  220 mg Oral Daily   Continuous Infusions:  heparin 1,500 Units/hr (08/25/21 1058)   PRN Meds: oxyCODONE **AND** acetaminophen, acetaminophen, chlorpheniramine-HYDROcodone, diazepam, guaiFENesin-dextromethorphan, magnesium hydroxide, ondansetron **OR** ondansetron (ZOFRAN) IV, traZODone   Vital Signs    Vitals:   08/25/21 0426 08/25/21 0831 08/25/21 1102 08/25/21 1515  BP: 117/80 125/76 (!) 109/58 113/66  Pulse: 88 78 70 71  Resp: 16 18 15 20   Temp: 98 F (36.7 C) 98 F (36.7 C) 97.9 F (36.6 C) 98.5 F (36.9 C)  TempSrc: Oral Oral Oral   SpO2: 96% 98% 98% 97%  Weight:      Height:        Intake/Output Summary (Last 24 hours) at 08/25/2021 1547 Last data filed at 08/25/2021 0830 Gross per 24 hour  Intake 2002.21 ml  Output 700 ml  Net 1302.21 ml   Filed Weights   08/21/21 2207 08/22/21 0957 08/22/21 2213  Weight: 113.4 kg 113.4 kg 112.6 kg    Physical Exam   GEN: Well nourished, well developed, in no acute distress.  HEENT: Grossly normal.  Neck:  Supple, no JVD, carotid bruits, or masses. Cardiac: RRR, no murmurs, rubs, or gallops. No clubbing, cyanosis, edema.  Radials 2+, DP/PT 2+ and equal bilaterally.  Respiratory:  Respirations regular and unlabored, bibasilar crackles w/ otw diminished breath sounds. GI: Soft, nontender, nondistended, BS + x 4. MS: no deformity or atrophy. Skin: warm and dry, no rash. Neuro:  Strength and sensation are intact. Psych: AAOx3.  Normal affect.  Labs    Chemistry Recent Labs  Lab 08/23/21 0555 08/24/21 0507 08/25/21 0623  NA 138 138 138  K 3.2* 3.3* 3.8  CL 107 107 107  CO2 25 24 24   GLUCOSE 97 108* 111*  BUN 10 12 12   CREATININE 0.92 0.89 0.91  CALCIUM 8.1* 8.0* 8.2*  PROT 6.1* 6.1* 6.1*  ALBUMIN 3.1* 2.9* 3.0*  AST 15 12* 13*  ALT 9 9 9   ALKPHOS 56 66 65  BILITOT 2.0* 1.7* 1.7*  GFRNONAA >60 >60 >60  ANIONGAP 6 7 7      Hematology Recent Labs  Lab 08/23/21 0555 08/24/21 0507 08/25/21 0623  WBC 7.4 7.4 7.9  RBC 2.90* 2.81* 2.87*  HGB 8.6* 8.3* 8.3*  HCT 26.0* 25.6* 26.2*  MCV 89.7 91.1 91.3  MCH 29.7 29.5 28.9  MCHC 33.1 32.4 31.7  RDW 15.9* 16.0* 16.1*  PLT 84* 82* 78*    Cardiac Enzymes  Recent Labs  Lab 08/22/21 0100 08/22/21 0500 08/22/21 1630 08/22/21 2202 08/23/21 0555  TROPONINIHS 1,252* 1,894* 1,244* 1,082* 877*      BNP Recent Labs  Lab 08/22/21 0500  BNP 574.8*     DDimer  Recent Labs  Lab 08/23/21 0555 08/24/21 0507 08/25/21 0623  DDIMER 0.40 0.34 0.50     Lipids  Lab Results  Component Value Date   CHOL 55 08/23/2021   HDL 19 (L) 08/23/2021   LDLCALC 25 08/23/2021   TRIG 56 08/23/2021   CHOLHDL 2.9 08/23/2021    HbA1c  Lab Results  Component Value Date   HGBA1C 6.6 (H) 08/22/2021    Radiology    DG Chest 2 View  Result Date: 08/21/2021 CLINICAL DATA:  Cough and short of breath EXAM: CHEST - 2 VIEW COMPARISON:  07/08/2020 FINDINGS: Mild cardiomegaly. Trace pleural effusions. No focal opacity or pneumothorax. No  overt pulmonary edema. IMPRESSION: Cardiomegaly with trace pleural effusions. Electronically Signed   By: Donavan Foil M.D.   On: 08/21/2021 22:37   Telemetry    Afib, 70's to 80's - Personally Reviewed  Cardiac Studies   Cardiac Catheterization and Percutaneous Coronary Intervention 11.5.2021 Diagnostic Dominance: Right  Intervention   _____________  2D Echocardiogram 12.17.2022  1. Left ventricular ejection fraction, by estimation, is 50 to 55%. The  left ventricle has low normal function. The left ventricle has no regional  wall motion abnormalities. The left ventricular internal cavity size was  mildly dilated. There is mild  left ventricular hypertrophy. Left ventricular diastolic parameters are  indeterminate.   2. Right ventricular systolic function is normal. The right ventricular  size is normal. There is moderately elevated pulmonary artery systolic  pressure. The estimated right ventricular systolic pressure is 82.9 mmHg.   3. Left atrial size was moderately dilated.   4. Right atrial size was moderately dilated.   5. The mitral valve is abnormal. Moderate mitral valve regurgitation. No  evidence of mitral stenosis.   6. The aortic valve is calcified. Aortic valve regurgitation is not  visualized. Aortic valve sclerosis/calcification is present, without any  evidence of aortic stenosis.   Patient Profile     80 year old male with known history of coronary artery disease status post PCI, chronic atrial fibrillation on anticoagulation with Eliquis, type 2 diabetes, GERD, hyperlipidemia, essential hypertension, previous PE and sleep apnea, who was admitted 12/17 w/ resp failure, chest pain, and COVID19 infection, and ruled in for NSTEMI (1252  [id number]  1244  1082  877).  Assessment & Plan    1.  NSTEMI/CAD:  s/p prior PCI's w/ most recent PCI of mLAD in 07/2020 (CTO dRCA, mod LCX dzs).  Admitted w/ resp failure/chest pain/COVID-19 and HsTrop rose to 1894.  Eliquis on  hold.  Continues to experience myalgias and pleuritic c/p.  Feels that cough is worse today.  I will tentatively add him on for cath tomorrow afternoon, w/ a low threshold to cancel should his cough not improve.  He does have crackles on exam and w/ elev RVSP, I will provide a dose of IV lasix as some diuresis may improve his resp status/cough.  Will add ASA for now given NSTEMI w/ [place] on hold.  Cont statin/? blocker.  2.  Essential HTN:  stable on ? blocker and acei.  3.  HL:  LDL of 25.  Cont statin rx.  4.  Permanent Afib: Rate controlled 70's to 90's.  Cont ? blocker.  Eliquis on hold.  5.  DMII:  per IM.  A1c 6.6.  6.  HFpEF:  EF 50-55% by echo this admission w/ RVSP of 53.6.  If accurate, he is net + I/O for admission.  No recent wt.  HR/BP stable.  Bibasilar crackles on exam.  As above, will give a dose of IV lasix this afternoon, otw cont PO lasix.  7.  Normocytic anemia:  H/H remain low, but stable.  Signed, Murray Hodgkins, NP  08/25/2021, 3:47 PM    For questions or updates, please contact   Please consult [email or web address] for contact info under Cardiology/STEMI.

## 2021-08-26 ENCOUNTER — Encounter
Admission: EM | Disposition: A | Discharge status: Home / Self Care | Payer: Self-pay | Source: Home / Self Care | Attending: Internal Medicine

## 2021-08-26 ENCOUNTER — Encounter: Payer: Self-pay | Admitting: Cardiovascular Disease

## 2021-08-26 DIAGNOSIS — I2510 Atherosclerotic heart disease of native coronary artery without angina pectoris: Secondary | ICD-10-CM

## 2021-08-26 DIAGNOSIS — I25118 Atherosclerotic heart disease of native coronary artery with other forms of angina pectoris: Secondary | ICD-10-CM

## 2021-08-26 DIAGNOSIS — E1159 Type 2 diabetes mellitus with other circulatory complications: Secondary | ICD-10-CM

## 2021-08-26 HISTORY — PX: LEFT HEART CATH AND CORONARY ANGIOGRAPHY: CATH118249

## 2021-08-26 LAB — CBC WITH DIFFERENTIAL/PLATELET
Abs Immature Granulocytes: 0.19 10*3/uL — ABNORMAL HIGH (ref 0.00–0.07)
Basophils Absolute: 0 10*3/uL (ref 0.0–0.1)
Basophils Relative: 0 %
Eosinophils Absolute: 0.1 10*3/uL (ref 0.0–0.5)
Eosinophils Relative: 1 %
HCT: 26.2 % — ABNORMAL LOW (ref 39.0–52.0)
Hemoglobin: 8.4 g/dL — ABNORMAL LOW (ref 13.0–17.0)
Immature Granulocytes: 2 %
Lymphocytes Relative: 11 %
Lymphs Abs: 0.9 10*3/uL (ref 0.7–4.0)
MCH: 28.6 pg (ref 26.0–34.0)
MCHC: 32.1 g/dL (ref 30.0–36.0)
MCV: 89.1 fL (ref 80.0–100.0)
Monocytes Absolute: 2.6 10*3/uL — ABNORMAL HIGH (ref 0.1–1.0)
Monocytes Relative: 31 %
Neutro Abs: 4.5 10*3/uL (ref 1.7–7.7)
Neutrophils Relative %: 55 %
Platelets: 88 10*3/uL — ABNORMAL LOW (ref 150–400)
RBC: 2.94 MIL/uL — ABNORMAL LOW (ref 4.22–5.81)
RDW: 16.6 % — ABNORMAL HIGH (ref 11.5–15.5)
WBC: 8.2 10*3/uL (ref 4.0–10.5)
nRBC: 0 % (ref 0.0–0.2)

## 2021-08-26 LAB — FERRITIN: Ferritin: 137 ng/mL (ref 24–336)

## 2021-08-26 LAB — MAGNESIUM: Magnesium: 1.7 mg/dL (ref 1.7–2.4)

## 2021-08-26 LAB — COMPREHENSIVE METABOLIC PANEL
ALT: 9 U/L (ref 0–44)
AST: 12 U/L — ABNORMAL LOW (ref 15–41)
Albumin: 3.2 g/dL — ABNORMAL LOW (ref 3.5–5.0)
Alkaline Phosphatase: 66 U/L (ref 38–126)
Anion gap: 6 (ref 5–15)
BUN: 11 mg/dL (ref 8–23)
CO2: 26 mmol/L (ref 22–32)
Calcium: 8.2 mg/dL — ABNORMAL LOW (ref 8.9–10.3)
Chloride: 105 mmol/L (ref 98–111)
Creatinine, Ser: 0.83 mg/dL (ref 0.61–1.24)
GFR, Estimated: >60 mL/min (ref >60)
Glucose, Bld: 120 mg/dL — ABNORMAL HIGH (ref 70–99)
Potassium: 3.7 mmol/L (ref 3.5–5.1)
Sodium: 137 mmol/L (ref 135–145)
Total Bilirubin: 1.6 mg/dL — ABNORMAL HIGH (ref 0.3–1.2)
Total Protein: 6.2 g/dL — ABNORMAL LOW (ref 6.5–8.1)

## 2021-08-26 LAB — C-REACTIVE PROTEIN: CRP: 4.3 mg/dL — ABNORMAL HIGH (ref <1.0)

## 2021-08-26 LAB — GLUCOSE, CAPILLARY
Glucose-Capillary: 108 mg/dL — ABNORMAL HIGH (ref 70–99)
Glucose-Capillary: 242 mg/dL — ABNORMAL HIGH (ref 70–99)

## 2021-08-26 LAB — D-DIMER, QUANTITATIVE: D-Dimer, Quant: 0.5 ug/mL-FEU (ref 0.00–0.50)

## 2021-08-26 LAB — HEPARIN LEVEL (UNFRACTIONATED): Heparin Unfractionated: 0.17 IU/mL — ABNORMAL LOW (ref 0.30–0.70)

## 2021-08-26 SURGERY — LEFT HEART CATH AND CORONARY ANGIOGRAPHY
Anesthesia: Moderate Sedation

## 2021-08-26 MED ORDER — SODIUM CHLORIDE 0.9 % IV SOLN: 250.0000 mL | INTRAVENOUS | Status: DC | PRN

## 2021-08-26 MED ORDER — SODIUM CHLORIDE 0.9% FLUSH: 3.0000 mL | INTRAVENOUS | Status: DC | PRN

## 2021-08-26 MED ORDER — HEPARIN SODIUM (PORCINE) 1000 UNIT/ML IJ SOLN
INTRAMUSCULAR | Status: AC
  Filled 2021-08-26: qty 10

## 2021-08-26 MED ORDER — ACETAMINOPHEN 325 MG PO TABS
650.0000 mg | ORAL_TABLET | Freq: Four times a day (QID) | ORAL | Status: DC | PRN
  Filled 2021-08-26: qty 2

## 2021-08-26 MED ORDER — PANTOPRAZOLE SODIUM 40 MG PO TBEC
80.0000 mg | DELAYED_RELEASE_TABLET | Freq: Two times a day (BID) | ORAL | Status: DC
  Administered 2021-08-26 – 2021-08-29 (×6): 80 mg via ORAL
  Filled 2021-08-26 (×6): qty 2

## 2021-08-26 MED ORDER — FENTANYL CITRATE (PF) 100 MCG/2ML IJ SOLN
INTRAMUSCULAR | Status: DC | PRN
  Administered 2021-08-26: 25 ug via INTRAVENOUS

## 2021-08-26 MED ORDER — HEPARIN BOLUS VIA INFUSION
3000.0000 [IU] | Freq: Once | INTRAVENOUS | Status: AC
  Administered 2021-08-26: 11:00:00 3000 [IU] via INTRAVENOUS
  Filled 2021-08-26: qty 3000

## 2021-08-26 MED ORDER — HEPARIN SODIUM (PORCINE) 1000 UNIT/ML IJ SOLN
INTRAMUSCULAR | Status: DC | PRN
  Administered 2021-08-26: 5000 [IU] via INTRAVENOUS

## 2021-08-26 MED ORDER — SUCRALFATE 1 GM/10ML PO SUSP
1.0000 g | Freq: Three times a day (TID) | ORAL | Status: DC
  Administered 2021-08-26 – 2021-08-29 (×11): 1 g via ORAL
  Filled 2021-08-26 (×11): qty 10

## 2021-08-26 MED ORDER — LIDOCAINE HCL (PF) 1 % IJ SOLN
INTRAMUSCULAR | Status: DC | PRN
  Administered 2021-08-26: 3 mL via SUBCUTANEOUS

## 2021-08-26 MED ORDER — VERAPAMIL HCL 2.5 MG/ML IV SOLN
INTRAVENOUS | Status: AC
  Filled 2021-08-26: qty 2

## 2021-08-26 MED ORDER — HEPARIN (PORCINE) IN NACL 1000-0.9 UT/500ML-% IV SOLN
INTRAVENOUS | Status: AC
  Filled 2021-08-26: qty 1000

## 2021-08-26 MED ORDER — MIDAZOLAM HCL 2 MG/2ML IJ SOLN
INTRAMUSCULAR | Status: AC
  Filled 2021-08-26: qty 2

## 2021-08-26 MED ORDER — IOHEXOL 300 MG/ML  SOLN
INTRAMUSCULAR | Status: DC | PRN
  Administered 2021-08-26: 12:00:00 70 mL

## 2021-08-26 MED ORDER — VERAPAMIL HCL 2.5 MG/ML IV SOLN
INTRAVENOUS | Status: DC | PRN
  Administered 2021-08-26: 2.5 mg via INTRA_ARTERIAL

## 2021-08-26 MED ORDER — MIDAZOLAM HCL 2 MG/2ML IJ SOLN
INTRAMUSCULAR | Status: DC | PRN
  Administered 2021-08-26: 1 mg via INTRAVENOUS

## 2021-08-26 MED ORDER — HEPARIN (PORCINE) IN NACL 1000-0.9 UT/500ML-% IV SOLN
INTRAVENOUS | Status: DC | PRN
  Administered 2021-08-26: 1000 mL

## 2021-08-26 MED ORDER — SODIUM CHLORIDE 0.9% FLUSH
3.0000 mL | Freq: Two times a day (BID) | INTRAVENOUS | Status: DC
  Administered 2021-08-26 – 2021-08-29 (×7): 3 mL via INTRAVENOUS

## 2021-08-26 MED ORDER — APIXABAN 5 MG PO TABS
5.0000 mg | ORAL_TABLET | Freq: Two times a day (BID) | ORAL | Status: DC
  Administered 2021-08-26 – 2021-08-29 (×6): 5 mg via ORAL
  Filled 2021-08-26 (×6): qty 1

## 2021-08-26 MED ORDER — FUROSEMIDE 40 MG PO TABS
40.0000 mg | ORAL_TABLET | Freq: Two times a day (BID) | ORAL | Status: DC
  Administered 2021-08-26 – 2021-08-27 (×2): 40 mg via ORAL
  Filled 2021-08-26 (×2): qty 1

## 2021-08-26 MED ORDER — FENTANYL CITRATE (PF) 100 MCG/2ML IJ SOLN
INTRAMUSCULAR | Status: AC
  Filled 2021-08-26: qty 2

## 2021-08-26 MED ORDER — LIDOCAINE HCL 1 % IJ SOLN
INTRAMUSCULAR | Status: AC
  Filled 2021-08-26: qty 20

## 2021-08-26 SURGICAL SUPPLY — 11 items
CATH OPTITORQUE JACKY 4.0 5F (CATHETERS) ×2 IMPLANT
DEVICE RAD TR BAND REGULAR (VASCULAR PRODUCTS) ×2 IMPLANT
DRAPE BRACHIAL (DRAPES) ×2 IMPLANT
GLIDESHEATH SLEND SS 6F .021 (SHEATH) ×2 IMPLANT
GUIDEWIRE INQWIRE 1.5J.035X260 (WIRE) IMPLANT
INQWIRE 1.5J .035X260CM (WIRE) ×3
PACK CARDIAC CATH (CUSTOM PROCEDURE TRAY) ×3 IMPLANT
PROTECTION STATION PRESSURIZED (MISCELLANEOUS) ×3
SET ATX SIMPLICITY (MISCELLANEOUS) ×2 IMPLANT
STATION PROTECTION PRESSURIZED (MISCELLANEOUS) IMPLANT
WIRE HITORQ VERSACORE ST 145CM (WIRE) ×2 IMPLANT

## 2021-08-26 NOTE — Progress Notes (Addendum)
PROGRESS NOTE    JAEGAR CROFT  PPI:[id number] DOB: [birth year] DOA: 08/22/2021 PCP: Baxter Hire, MD    Brief Narrative:  Cody KNOTH is a 80 year old male with past medical history significant for CAD, type 2 diabetes mellitus, GERD, hiatal hernia, dyslipidemia, essential hypertension, Hx PE, obstructive sleep apnea who presented to [facility] ED on 12/17 with acute onset chest pain.  Localized in the midsternal region, described as a pressure-like sensation 5/10 in severity with no radiation.  In the ED, COVID-19 PCR positive.  High sensitive troponin was noted to be elevated.  Chest x-ray with cardiomegaly and trace bilateral pleural effusions.  EKG with atrial fibrillation, PVCs.  Cardiology was consulted.  Started on heparin drip.  TRH consulted for further evaluation and management of acute onset chest pain and Covid-19 viral infection.   Assessment & Plan:   Principal Problem:   NSTEMI (non-ST elevated myocardial infarction) ([place]) Active Problems:   Hyperlipidemia   Coronary atherosclerosis   ATRIAL FIBRILLATION   Diabetes mellitus ([place])   Hx of pulmonary embolus   Hypertension   NSTEMI 2/2 type II demand ischemia Hx CAD s/p PCI/stent Patient presenting with pressure-like chest pain.  Troponin elevated on admission and peaked at 1008 or 94.  History of CAD s/p stenting LAD.  Cardiology was consulted, started on heparin drip and patient underwent left heart catheterization on 08/26/2021 with findings of patent LAD stents with no significant restenosis, chronically occluded RCA with collaterals, moderate disease left circumflex disease with no significant change in coronary arteries since last heart catheterization. --Cardiology following, appreciate assistance --Heparin drip now transitioned back to Eliquis  COVID-19 viral infection Upon admission, COVID-19 PCR positive on 08/21/2021.  Apparently has been having symptoms for roughly 2 weeks.Chest x-ray with cardiomegaly,  trace pleural effusions with no focal opacity/consolidation.  Completed 3-day course of IV remdesivir during hospitalization.  Oxygenating well on room air. --CRP 2.4>3.5>5.0>5.7>4.3 --Vitamin C, zinc, Mucinex, Tussionex as needed  Essential hypertension Chronic diastolic congestive heart failure BNP elevated on admission, 574.8.  Chest x-ray with cardiomegaly and trace bilateral pleural effusions.  TTE 08/24/2021 with LVEF 50-55%, LV low normal function, no regional wall motion abnormalities LV, LV internal cavity mildly dilated, mild LVH, RV systolic function normal, RVSP 53.6 mmHg, LA moderately dilated, RA moderately dilated, moderate MR, no aortic stenosis. --Furosemide 40 mg p.o. twice daily --Metoprolol succinate 50 mg p.o. daily --Lisinopril 5 mg p.o. daily --Strict I's and O's and daily weights  Hyperlipidemia: Atorvastatin 40 mg p.o. daily, Lovaza 2g PO daily  Paroxysmal atrial fibrillation --Metoprolol succinate 50 mg p.o. daily --Eliquis 5 mg p.o. twice daily  Hx of pulmonary embolism --Continue Eliquis  Type 2 diabetes mellitus At baseline on glimepiride 4 mg p.o. twice daily. --Hold oral hypoglycemics while inpatient --SSI for coverage --CBGs qAC/HS  GERD:  --Protonix 80 mg p.o. BID (Prilosec 40mg  BID at home) --carafate 1g PO TIDAC  BPH: Tamsulosin 0.4 mg p.o. daily, finasteride 5 mg p.o. daily  Anxiety: --Diazepam 5 mg p.o. 3 times daily as needed   DVT prophylaxis:  apixaban (ELIQUIS) tablet 5 mg    Code Status: DNR Family Communication: No family present at bedside this morning  Disposition Plan:  Level of care: Progressive Status is: Inpatient  Remains inpatient appropriate because: Awaiting further cardiology recommendations, hopeful for discharge tomorrow    Consultants:  Cardiology  Procedures:  Left heart catheterization 12/21  Antimicrobials:  Remdesivir 12/16 - 12/19   Subjective: Patient seen examined at bedside, resting  comfortably.  Continues with intermittent chest pain.  Pending left heart catheterization later this morning.  No other questions or concerns at this time.  No family present at bedside.  Denies headache, no shortness of breath, no fever/chills/night sweats, no nausea/vomiting/diarrhea, no palpitations, no weakness, no fatigue, no paresthesias.  No acute events overnight per nursing staff.  Objective: Vitals:   08/26/21 1300 08/26/21 1315 08/26/21 1330 08/26/21 1400  BP: 107/74  127/67 (!) 122/56  Pulse: 75 73 90 (!) 47  Resp: 16 17 20 17   Temp:      TempSrc:      SpO2: 99% 97% 100% 100%  Weight:      Height:        Intake/Output Summary (Last 24 hours) at 08/26/2021 1456 Last data filed at 08/26/2021 6213 Gross per 24 hour  Intake 1154.33 ml  Output 3400 ml  Net -2245.67 ml   Filed Weights   08/21/21 2207 08/22/21 0957 08/22/21 2213  Weight: 113.4 kg 113.4 kg 112.6 kg    Examination:  General exam: Appears calm and comfortable  Respiratory system: Clear to auscultation. Respiratory effort normal.  On room air Cardiovascular system: S1 & S2 heard, RRR. No JVD, murmurs, rubs, gallops or clicks. No pedal edema. Gastrointestinal system: Abdomen is nondistended, soft and nontender. No organomegaly or masses felt. Normal bowel sounds heard. Central nervous system: Alert and oriented. No focal neurological deficits. Extremities: Symmetric 5 x 5 power. Skin: No rashes, lesions or ulcers Psychiatry: Judgement and insight appear normal. Mood & affect appropriate.     Data Reviewed: I have personally reviewed following labs and imaging studies  CBC: Recent Labs  Lab 08/21/21 2212 08/23/21 0555 08/24/21 0507 08/25/21 0623 08/26/21 0739  WBC 9.0 7.4 7.4 7.9 8.2  NEUTROABS  --  3.3 3.1 4.9 4.5  HGB 9.4* 8.6* 8.3* 8.3* 8.4*  HCT 29.0* 26.0* 25.6* 26.2* 26.2*  MCV 90.6 89.7 91.1 91.3 89.1  PLT 99* 84* 82* 78* 88*   Basic Metabolic Panel: Recent Labs  Lab 08/22/21 0500  08/23/21 0555 08/24/21 0507 08/25/21 0623 08/26/21 0739  NA 138 138 138 138 137  K 3.4* 3.2* 3.3* 3.8 3.7  CL 107 107 107 107 105  CO2 25 25 24 24 26   GLUCOSE 172* 97 108* 111* 120*  BUN 12 10 12 12 11   CREATININE 0.86 0.92 0.89 0.91 0.83  CALCIUM 7.7* 8.1* 8.0* 8.2* 8.2*  MG  --  1.5* 1.8 1.9 1.7   GFR: Estimated Creatinine Clearance: 94.9 mL/min (by C-G formula based on SCr of 0.83 mg/dL). Liver Function Tests: Recent Labs  Lab 08/22/21 0500 08/23/21 0555 08/24/21 0507 08/25/21 0623 08/26/21 0739  AST 18 15 12* 13* 12*  ALT 9 9 9 9 9   ALKPHOS 55 56 66 65 66  BILITOT 1.2 2.0* 1.7* 1.7* 1.6*  PROT 6.1* 6.1* 6.1* 6.1* 6.2*  ALBUMIN 2.9* 3.1* 2.9* 3.0* 3.2*   No results for input(s): LIPASE, AMYLASE in the last 168 hours. No results for input(s): AMMONIA in the last 168 hours. Coagulation Profile: Recent Labs  Lab 08/22/21 0257  INR 1.6*   Cardiac Enzymes: No results for input(s): CKTOTAL, CKMB, CKMBINDEX, TROPONINI in the last 168 hours. BNP (last 3 results) No results for input(s): PROBNP in the last 8760 hours. HbA1C: No results for input(s): HGBA1C in the last 72 hours. CBG: Recent Labs  Lab 08/25/21 0834 08/25/21 1145 08/25/21 1516 08/25/21 2127 08/26/21 0808  GLUCAP 99 159* 157* 149* 108*  Lipid Profile: No results for input(s): CHOL, HDL, LDLCALC, TRIG, CHOLHDL, LDLDIRECT in the last 72 hours. Thyroid Function Tests: No results for input(s): TSH, T4TOTAL, FREET4, T3FREE, THYROIDAB in the last 72 hours. Anemia Panel: Recent Labs    08/25/21 0623 08/26/21 0739  FERRITIN 148 137   Sepsis Labs: Recent Labs  Lab 08/22/21 0500  PROCALCITON <0.10    Recent Results (from the past 240 hour(s))  Resp Panel by RT-PCR (Flu A&B, Covid) Nasopharyngeal Swab     Status: Abnormal   Collection Time: 08/21/21 10:12 PM   Specimen: Nasopharyngeal Swab; Nasopharyngeal(NP) swabs in vial transport medium  Result Value Ref Range Status   SARS Coronavirus 2  by RT PCR POSITIVE (A) NEGATIVE Final    Comment: (NOTE) SARS-CoV-2 target nucleic acids are DETECTED.  The SARS-CoV-2 RNA is generally detectable in upper respiratory specimens during the acute phase of infection. Positive results are indicative of the presence of the identified virus, but do not rule out bacterial infection or co-infection with other pathogens not detected by the test. Clinical correlation with patient history and other diagnostic information is necessary to determine patient infection status. The expected result is Negative.  Fact Sheet for Patients: EntrepreneurPulse.com.au  Fact Sheet for Healthcare Providers: IncredibleEmployment.be  This test is not yet approved or cleared by the [place] FDA and  has been authorized for detection and/or diagnosis of SARS-CoV-2 by FDA under an Emergency Use Authorization (EUA).  This EUA will remain in effect (meaning this test can be used) for the duration of  the COVID-19 declaration under Section 564(b)(1) of the A ct, 21 U.S.C. section 360bbb-3(b)(1), unless the authorization is terminated or revoked sooner.     Influenza A by PCR NEGATIVE NEGATIVE Final   Influenza B by PCR NEGATIVE NEGATIVE Final    Comment: (NOTE) The Xpert Xpress SARS-CoV-2/FLU/RSV plus assay is intended as an aid in the diagnosis of influenza from Nasopharyngeal swab specimens and should not be used as a sole basis for treatment. Nasal washings and aspirates are unacceptable for Xpert Xpress SARS-CoV-2/FLU/RSV testing.  Fact Sheet for Patients: EntrepreneurPulse.com.au  Fact Sheet for Healthcare Providers: IncredibleEmployment.be  This test is not yet approved or cleared by the [place] FDA and has been authorized for detection and/or diagnosis of SARS-CoV-2 by FDA under an Emergency Use Authorization (EUA). This EUA will remain in effect (meaning this test can  be used) for the duration of the COVID-19 declaration under Section 564(b)(1) of the Act, 21 U.S.C. section 360bbb-3(b)(1), unless the authorization is terminated or revoked.  Performed at [facility], [address] [place], [place] [address]          Radiology Studies: CARDIAC CATHETERIZATION  Result Date: 08/26/2021   Dist LAD lesion is 80% stenosed.   Prox LAD-1 lesion is 10% stenosed.   Prox Cx lesion is 50% stenosed.   Dist RCA lesion is 100% stenosed.   Ost 1st Diag lesion is 90% stenosed.   Prox RCA lesion is 99% stenosed.   Ost 3rd Mrg to 3rd Mrg lesion is 50% stenosed.   Mid LAD lesion is 10% stenosed.   Ost LM lesion is 20% stenosed.   Non-stenotic Prox LAD-2 lesion was previously treated.   The left ventricular systolic function is normal.   LV end diastolic pressure is moderately elevated.   The left ventricular ejection fraction is 50-55% by visual estimate. 1.  Patent LAD stents with no significant restenosis.  Chronically occluded right coronary artery with left-to-right  collaterals and moderate left circumflex disease.  No significant change in coronary anatomy since most recent cardiac catheterization. 2.  Low normal LV systolic function with an EF of 50 to 55%.  Moderately elevated left ventricular end-diastolic pressure at 22 mmHg. Recommendations: Elevated troponin is likely due to supply demand ischemia. Recommend continuing medical therapy. Eliquis can be resumed tonight. The patient is volume overloaded and I elected to resume furosemide 40 mg by mouth twice daily.        Scheduled Meds:  apixaban  5 mg Oral BID   vitamin C  500 mg Oral Daily   aspirin EC  81 mg Oral Daily   atorvastatin  40 mg Oral Daily   cholecalciferol  1,000 Units Oral Daily   cyanocobalamin  1,000 mcg Intramuscular Q30 days   finasteride  5 mg Oral Daily   furosemide  40 mg Oral BID   glimepiride  4 mg Oral BID   guaiFENesin  600 mg Oral BID   insulin aspart  0-15 Units  Subcutaneous TID AC & HS   lisinopril  5 mg Oral Daily   metoprolol succinate  50 mg Oral Q breakfast   multivitamin with minerals  1 tablet Oral Daily   omega-3 acid ethyl esters  2 g Oral Daily   pantoprazole  40 mg Oral Daily   potassium chloride SA  10 mEq Oral Daily   sodium chloride flush  3 mL Intravenous Q12H   sodium chloride flush  3 mL Intravenous Q12H   tamsulosin  0.4 mg Oral Daily   zinc sulfate  220 mg Oral Daily   Continuous Infusions:  sodium chloride     sodium chloride 10 mL/hr at 08/26/21 0174   sodium chloride       LOS: 4 days    Time spent: 39 minutes spent on chart review, discussion with nursing staff, consultants, updating family and interview/physical exam; more than 50% of that time was spent in counseling and/or coordination of care.    Maddyx Vallie J [place] DO [facility] Available via Epic secure chat 7am-7pm After these hours, please refer to coverage provider listed on [email or web address] 08/26/2021, 2:56 PM

## 2021-08-26 NOTE — Progress Notes (Addendum)
Progress Note  Patient Name: Cody Price Date of Encounter: 08/26/2021  [facility] Cardiologist: Ida Rogue, MD   Subjective   Seen this morning given significant coughing yesterday Diagnosed with non-STEMI, COVID He reports he has not felt well for quite some time, has episodes of general malaise of unclear etiology Troponin elevation close to 1900, Eliquis held in preparation for ischemic work-up He feels he will be able to lay flat for catheterization He did receive IV Lasix yesterday with improvement of his breathing   Inpatient Medications    Scheduled Meds:  apixaban  5 mg Oral BID   vitamin C  500 mg Oral Daily   aspirin EC  81 mg Oral Daily   atorvastatin  40 mg Oral Daily   cholecalciferol  1,000 Units Oral Daily   cyanocobalamin  1,000 mcg Intramuscular Q30 days   finasteride  5 mg Oral Daily   furosemide  40 mg Oral BID   glimepiride  4 mg Oral BID   guaiFENesin  600 mg Oral BID   insulin aspart  0-15 Units Subcutaneous TID AC & HS   lisinopril  5 mg Oral Daily   metoprolol succinate  50 mg Oral Q breakfast   multivitamin with minerals  1 tablet Oral Daily   omega-3 acid ethyl esters  2 g Oral Daily   pantoprazole  40 mg Oral Daily   potassium chloride SA  10 mEq Oral Daily   sodium chloride flush  3 mL Intravenous Q12H   sodium chloride flush  3 mL Intravenous Q12H   tamsulosin  0.4 mg Oral Daily   zinc sulfate  220 mg Oral Daily   Continuous Infusions:  sodium chloride     sodium chloride 10 mL/hr at 08/26/21 9323   sodium chloride     PRN Meds: sodium chloride, sodium chloride, oxyCODONE **AND** acetaminophen, acetaminophen, chlorpheniramine-HYDROcodone, diazepam, guaiFENesin-dextromethorphan, magnesium hydroxide, ondansetron **OR** ondansetron (ZOFRAN) IV, sodium chloride flush, sodium chloride flush, traZODone   Vital Signs    Vitals:   08/26/21 1300 08/26/21 1315 08/26/21 1330 08/26/21 1400  BP: 107/74  127/67 (!) 122/56  Pulse:  75 73 90 (!) 47  Resp: 16 17 20 17   Temp:      TempSrc:      SpO2: 99% 97% 100% 100%  Weight:      Height:        Intake/Output Summary (Last 24 hours) at 08/26/2021 1458 Last data filed at 08/26/2021 5573 Gross per 24 hour  Intake 1154.33 ml  Output 3400 ml  Net -2245.67 ml   Last 3 Weights 08/22/2021 08/22/2021 08/21/2021  Weight (lbs) 248 lb 3.2 oz 250 lb 250 lb  Weight (kg) 112.583 kg 113.399 kg 113.399 kg      Telemetry    Normal sinus rhythm- Personally Reviewed  ECG    - Personally Reviewed  Physical Exam   GEN: No acute distress.   Neck: JVD 10+ Cardiac: Irregularly irregular, no murmurs, rubs, or gallops.  Respiratory: Coarse breath sounds bilaterally, scattered Rales GI: Soft, nontender, non-distended  MS: No edema; No deformity. Neuro:  Nonfocal  Psych: Normal affect   Labs    High Sensitivity Troponin:   Recent Labs  Lab 08/22/21 0100 08/22/21 0500 08/22/21 1630 08/22/21 2202 08/23/21 0555  TROPONINIHS 1,252* 1,894* 1,244* 1,082* 877*     Chemistry Recent Labs  Lab 08/24/21 0507 08/25/21 0623 08/26/21 0739  NA 138 138 137  K 3.3* 3.8 3.7  CL 107 107 105  CO2 24 24 26   GLUCOSE 108* 111* 120*  BUN 12 12 11   CREATININE 0.89 0.91 0.83  CALCIUM 8.0* 8.2* 8.2*  MG 1.8 1.9 1.7  PROT 6.1* 6.1* 6.2*  ALBUMIN 2.9* 3.0* 3.2*  AST 12* 13* 12*  ALT 9 9 9   ALKPHOS 66 65 66  BILITOT 1.7* 1.7* 1.6*  GFRNONAA >60 >60 >60  ANIONGAP 7 7 6     Lipids  Recent Labs  Lab 08/23/21 0555  CHOL 55  TRIG 56  HDL 19*  LDLCALC 25  CHOLHDL 2.9    Hematology Recent Labs  Lab 08/24/21 0507 08/25/21 0623 08/26/21 0739  WBC 7.4 7.9 8.2  RBC 2.81* 2.87* 2.94*  HGB 8.3* 8.3* 8.4*  HCT 25.6* 26.2* 26.2*  MCV 91.1 91.3 89.1  MCH 29.5 28.9 28.6  MCHC 32.4 31.7 32.1  RDW 16.0* 16.1* 16.6*  PLT 82* 78* 88*   Thyroid No results for input(s): TSH, FREET4 in the last 168 hours.  BNP Recent Labs  Lab 08/22/21 0500  BNP 574.8*    DDimer   Recent Labs  Lab 08/24/21 0507 08/25/21 0623 08/26/21 0739  DDIMER 0.34 0.50 0.50     Radiology    CARDIAC CATHETERIZATION  Result Date: 08/26/2021   Dist LAD lesion is 80% stenosed.   Prox LAD-1 lesion is 10% stenosed.   Prox Cx lesion is 50% stenosed.   Dist RCA lesion is 100% stenosed.   Ost 1st Diag lesion is 90% stenosed.   Prox RCA lesion is 99% stenosed.   Ost 3rd Mrg to 3rd Mrg lesion is 50% stenosed.   Mid LAD lesion is 10% stenosed.   Ost LM lesion is 20% stenosed.   Non-stenotic Prox LAD-2 lesion was previously treated.   The left ventricular systolic function is normal.   LV end diastolic pressure is moderately elevated.   The left ventricular ejection fraction is 50-55% by visual estimate. 1.  Patent LAD stents with no significant restenosis.  Chronically occluded right coronary artery with left-to-right collaterals and moderate left circumflex disease.  No significant change in coronary anatomy since most recent cardiac catheterization. 2.  Low normal LV systolic function with an EF of 50 to 55%.  Moderately elevated left ventricular end-diastolic pressure at 22 mmHg. Recommendations: Elevated troponin is likely due to supply demand ischemia. Recommend continuing medical therapy. Eliquis can be resumed tonight. The patient is volume overloaded and I elected to resume furosemide 40 mg by mouth twice daily.    Cardiac Studies     Patient Profile     80 year old male with known history of coronary artery disease status post PCI, chronic atrial fibrillation on anticoagulation with Eliquis, type 2 diabetes, GERD, hyperlipidemia, essential hypertension, previous PE and sleep apnea, who was admitted 12/17 w/ resp failure, chest pain, and COVID19 infection, and ruled in for NSTEMI (1252  1894  1244  1082  877).  Assessment & Plan     1.  NSTEMI/CAD:   Known coronary artery disease, with details as below PCI of mLAD in 07/2020 (CTO dRCA, mod LCX dzs).   Presenting with acute on  chronic respiratory failure, chest pain/angina, COVID HsTrop rose to 1894.  He is concerned about episodes of general malaise, chest pain Discussed cardiac catheterization likely the best option given markedly elevated troponin and known history I have reviewed the risks, indications, and alternatives to cardiac catheterization, possible angioplasty, and stenting with the patient. Risks include but are not limited to bleeding, infection, vascular injury, stroke,  myocardial infection, arrhythmia, kidney injury, radiation-related injury in the case of prolonged fluoroscopy use, emergency cardiac surgery, and death. The patient understands the risks of serious complication is 1-2 in 5486 with diagnostic cardiac cath and 1-2% or less with angioplasty/stenting.  -- He is willing to proceed with catheterization later this morning  we have called the catheterization lab, they are aware --continue  ASA statin/? blocker.  2.  Essential HTN:   stable on ? blocker and acei. No changes to his medications   3.  HL:   LDL of 25.  Continue statin, numbers at goal   4.  Permanent Afib:  Rate controlled 70's to 90's.   On ? blocker.  Eliquis on hold pending catheterization results   5.  DMII:   per IM.  A1c 6.6.   6.  HFpEF:   EF 50-55% by echo this admission w/ RVSP of 53.6.   Following catheterization we will look to continue IV Lasix  7.  Normocytic anemia:   H/H remain low, but stable.  Long discussion with him concerning his recent symptoms, COVID presentation, risk and benefit of cardiac catheterization  Total encounter time more than 35 minutes  Greater than 50% was spent in counseling and coordination of care with the patient    For questions or updates, please contact [place] Please consult [email or web address] for contact info under        Signed, Ida Rogue, MD  08/26/2021, 2:58 PM

## 2021-08-26 NOTE — Progress Notes (Signed)
Updated pt's wife with pt's permission

## 2021-08-26 NOTE — Interval H&P Note (Signed)
Cath Lab Visit (complete for each Cath Lab visit)  Clinical Evaluation Leading to the Procedure:   ACS: Yes.    Non-ACS:  n/a   History and Physical Interval Note:  08/26/2021 11:53 AM  Cody Price  has presented today for surgery, with the diagnosis of nstemi (active covid-19).  The various methods of treatment have been discussed with the patient and family. After consideration of risks, benefits and other options for treatment, the patient has consented to  Procedure(s): LEFT HEART CATH AND CORONARY ANGIOGRAPHY (N/A) as a surgical intervention.  The patient's history has been reviewed, patient examined, no change in status, stable for surgery.  I have reviewed the patient's chart and labs.  Questions were answered to the patient's satisfaction.     Kathlyn [place]

## 2021-08-26 NOTE — Progress Notes (Signed)
Patient prepped for heart cath, both groins clipped, 2nd iv placed by Iv team, CHG bath given, consent is signed and on chart.

## 2021-08-26 NOTE — Progress Notes (Signed)
[place] for heparin infusion Indication: NSTEMI   Allergies  Allergen Reactions   Novocain [Procaine] Other (See Comments)    Syncope   Procaine Hcl Other (See Comments)    Passes out   Lyrica [Pregabalin]    Neurontin [Gabapentin]    Amitriptyline Other (See Comments)    Sleepy   Dicyclomine Other (See Comments)    Other reaction(s): Abdominal Pain   Metformin Nausea Only   Nitroglycerin Other (See Comments)    Makes Blood pressure go to low.     Patient Measurements: Height: 6\' 1"  (185.4 cm) Weight: 112.6 kg (248 lb 3.2 oz) IBW/kg (Calculated) : 79.9 Heparin Dosing Weight: 106 kg  Vital Signs: Temp: 98.4 F (36.9 C) (12/21 0811) Temp Source: Oral (12/21 0811) BP: 118/71 (12/21 0811) Pulse Rate: 87 (12/21 0811)  Labs: Recent Labs    08/23/21 1551 08/23/21 2356 08/23/21 2356 08/24/21 0507 08/25/21 0623 08/26/21 0739  HGB  --   --   --  8.3* 8.3*  --   HCT  --   --   --  25.6* 26.2*  --   PLT  --   --   --  82* 78*  --   APTT 81* 72*  --  79*  --   --   HEPARINUNFRC  --  0.69   < > 0.59 0.31 0.17*  CREATININE  --   --   --  0.89 0.91  --    < > = values in this interval not displayed.     Estimated Creatinine Clearance: 86.6 mL/min (by C-G formula based on SCr of 0.91 mg/dL).   Medical History: Past Medical History:  Diagnosis Date   Arthritis    degenerative back    BPH with obstruction/lower urinary tract symptoms    CAD (coronary artery disease) 2010   stent   Diabetes mellitus    ED (erectile dysfunction)    Elevated PSA    GERD (gastroesophageal reflux disease)    Gross hematuria    H/O CT scan    recent renal CT due to hematuria, cystoscopy - normal     Hematuria    History of hiatal hernia    History of kidney stones    HLD (hyperlipidemia)    Hypertension    Ischemic heart disease 1991   with angioplasty   Kidney stone    Morbid obesity ([place])    Myocardial infarction ([place])    Peripheral  neuropathy    Pernicious anemia    Pneumonia 1995   /w PE,    Pulmonary embolism ([facility])    previous   Renal cyst    Sleep apnea    pt. denies sleep apnea, pt. states he has had 2 studies - last one 2011   Spinal stenosis     Medications:  PTA Meds:  Eliquis 5 mg BID, last dose unknown Heparin Dosing Weight: 106 kg  Assessment: Pt is 80 yo male medical history significant for coronary artery disease, type 2 diabetes mellitus, GERD, hiatal hernia, dyslipidemia, hypertension, PE and sleep apnea, who presented to the ER with acute onset of chest pain that started last night in the midsternal area and felt as pressure, graded 4-5/10 in severity with no radiation. Pharmacy consulted to start heparin. Pt takes apixaban PTA. Will use aPTT monitoring due to false elevation in anti-xa levels.   12/17 0257 HL > 1.1 (Heparin infusion started 1217 @0305 ) 12/17 0613 aPTT 71.  12/17 1412 aPTT  69 12/18 0555 aPTT 65, slightly subtherapeutic, HL 0.84 12/18 1551 aPTT 81 12/18 2356 aPTT 72, therapeutic, HL 0.69 12/19 0507 aPTT 79, therapeutic, HL 0.59 12/20 0623 HL 0.31; therapeutic @1500  un/hr 12/21 0739 HL 0.17, subtherapeutic.    Goal of Therapy:  Heparin level 0.3-0.7 units/ml once aPTT and heparin level correlate.  aPTT 66-102 seconds Monitor platelets by anticoagulation protocol: Yes   Plan:  Heparin level is subtherapeutic. Will give heparin bolus of 3000 units and increase heparin infusion to 1700 units/hr. Recheck heparin level in 8 hours. CBC daily while on heparin.   Eleonore Chiquito, PharmD,  Clinical Pharmacist 08/26/2021 8:44 AM

## 2021-08-27 DIAGNOSIS — E782 Mixed hyperlipidemia: Secondary | ICD-10-CM

## 2021-08-27 LAB — CBC WITH DIFFERENTIAL/PLATELET
Abs Immature Granulocytes: 0.18 10*3/uL — ABNORMAL HIGH (ref 0.00–0.07)
Basophils Absolute: 0 10*3/uL (ref 0.0–0.1)
Basophils Relative: 0 %
Eosinophils Absolute: 0.1 10*3/uL (ref 0.0–0.5)
Eosinophils Relative: 1 %
HCT: 26.3 % — ABNORMAL LOW (ref 39.0–52.0)
Hemoglobin: 8.5 g/dL — ABNORMAL LOW (ref 13.0–17.0)
Immature Granulocytes: 2 %
Lymphocytes Relative: 10 %
Lymphs Abs: 1 10*3/uL (ref 0.7–4.0)
MCH: 29.7 pg (ref 26.0–34.0)
MCHC: 32.3 g/dL (ref 30.0–36.0)
MCV: 92 fL (ref 80.0–100.0)
Monocytes Absolute: 3.6 10*3/uL — ABNORMAL HIGH (ref 0.1–1.0)
Monocytes Relative: 37 %
Neutro Abs: 5 10*3/uL (ref 1.7–7.7)
Neutrophils Relative %: 50 %
Platelets: 88 10*3/uL — ABNORMAL LOW (ref 150–400)
RBC: 2.86 MIL/uL — ABNORMAL LOW (ref 4.22–5.81)
RDW: 16.5 % — ABNORMAL HIGH (ref 11.5–15.5)
Smear Review: NORMAL
WBC: 9.8 10*3/uL (ref 4.0–10.5)
nRBC: 0 % (ref 0.0–0.2)

## 2021-08-27 LAB — COMPREHENSIVE METABOLIC PANEL
ALT: 11 U/L (ref 0–44)
AST: 10 U/L — ABNORMAL LOW (ref 15–41)
Albumin: 3.2 g/dL — ABNORMAL LOW (ref 3.5–5.0)
Alkaline Phosphatase: 65 U/L (ref 38–126)
Anion gap: 5 (ref 5–15)
BUN: 10 mg/dL (ref 8–23)
CO2: 27 mmol/L (ref 22–32)
Calcium: 8.4 mg/dL — ABNORMAL LOW (ref 8.9–10.3)
Chloride: 106 mmol/L (ref 98–111)
Creatinine, Ser: 0.85 mg/dL (ref 0.61–1.24)
GFR, Estimated: >60 mL/min (ref >60)
Glucose, Bld: 111 mg/dL — ABNORMAL HIGH (ref 70–99)
Potassium: 3.9 mmol/L (ref 3.5–5.1)
Sodium: 138 mmol/L (ref 135–145)
Total Bilirubin: 1.6 mg/dL — ABNORMAL HIGH (ref 0.3–1.2)
Total Protein: 6.4 g/dL — ABNORMAL LOW (ref 6.5–8.1)

## 2021-08-27 LAB — GLUCOSE, CAPILLARY
Glucose-Capillary: 93 mg/dL (ref 70–99)
Glucose-Capillary: 89 mg/dL (ref 70–99)
Glucose-Capillary: 127 mg/dL — ABNORMAL HIGH (ref 70–99)
Glucose-Capillary: 181 mg/dL — ABNORMAL HIGH (ref 70–99)
Glucose-Capillary: 150 mg/dL — ABNORMAL HIGH (ref 70–99)

## 2021-08-27 LAB — MAGNESIUM: Magnesium: 1.7 mg/dL (ref 1.7–2.4)

## 2021-08-27 LAB — PATHOLOGIST SMEAR REVIEW

## 2021-08-27 MED ORDER — LORATADINE 10 MG PO TABS
10.0000 mg | ORAL_TABLET | Freq: Every day | ORAL | Status: DC
  Administered 2021-08-27 – 2021-08-29 (×3): 10 mg via ORAL
  Filled 2021-08-27 (×3): qty 1

## 2021-08-27 MED ORDER — FLUTICASONE PROPIONATE 50 MCG/ACT NA SUSP
2.0000 | Freq: Every day | NASAL | Status: DC
Start: 2021-08-27 — End: 2021-08-29
  Administered 2021-08-27 – 2021-08-29 (×3): 2 via NASAL
  Filled 2021-08-27: qty 16

## 2021-08-27 MED ORDER — FUROSEMIDE 10 MG/ML IJ SOLN
40.0000 mg | Freq: Two times a day (BID) | INTRAMUSCULAR | Status: DC
  Administered 2021-08-27 – 2021-08-28 (×3): 40 mg via INTRAVENOUS
  Filled 2021-08-27 (×3): qty 4

## 2021-08-27 NOTE — Progress Notes (Signed)
Progress Note  Patient Name: Cody Price Date of Encounter: 08/27/2021  [facility] Cardiologist: Ida Rogue, MD   Subjective   Reports feeling somewhat better, coughing is slowly improving Still not at baseline Catheterization results yesterday discussed with him: Patent LAD stents, no restenosis, chronically occluded right coronary artery left to right collaterals moderate left circumflex disease ejection fraction 50 to 55% with moderately elevated left ventricular end-diastolic pressure --Felt to be demand ischemia Continues on Lasix 40 twice daily, Eliquis restarted  He does report high fluid intake at home  Inpatient Medications    Scheduled Meds:  apixaban  5 mg Oral BID   vitamin C  500 mg Oral Daily   aspirin EC  81 mg Oral Daily   atorvastatin  40 mg Oral Daily   cholecalciferol  1,000 Units Oral Daily   cyanocobalamin  1,000 mcg Intramuscular Q30 days   finasteride  5 mg Oral Daily   fluticasone  2 spray Each Nare Daily   furosemide  40 mg Oral BID   guaiFENesin  600 mg Oral BID   insulin aspart  0-15 Units Subcutaneous TID AC & HS   lisinopril  5 mg Oral Daily   loratadine  10 mg Oral Daily   metoprolol succinate  50 mg Oral Q breakfast   multivitamin with minerals  1 tablet Oral Daily   omega-3 acid ethyl esters  2 g Oral Daily   pantoprazole  80 mg Oral BID   potassium chloride SA  10 mEq Oral Daily   sodium chloride flush  3 mL Intravenous Q12H   sodium chloride flush  3 mL Intravenous Q12H   sucralfate  1 g Oral TID WC & HS   tamsulosin  0.4 mg Oral Daily   zinc sulfate  220 mg Oral Daily   Continuous Infusions:  sodium chloride     sodium chloride 10 mL/hr at 08/26/21 5573   sodium chloride     PRN Meds: sodium chloride, sodium chloride, oxyCODONE **AND** acetaminophen, acetaminophen, chlorpheniramine-HYDROcodone, diazepam, guaiFENesin-dextromethorphan, magnesium hydroxide, ondansetron **OR** ondansetron (ZOFRAN) IV, sodium chloride  flush, sodium chloride flush, traZODone   Vital Signs    Vitals:   08/27/21 0508 08/27/21 0756 08/27/21 1220 08/27/21 1650  BP: 121/77 126/71 113/71 (!) 108/51  Pulse: 83 83 71 73  Resp: 20 18 17 18   Temp: 98 F (36.7 C) 98.1 F (36.7 C) 98.1 F (36.7 C) 98.3 F (36.8 C)  TempSrc: Oral Oral Oral Oral  SpO2: 100% 99% 95% 97%  Weight:      Height:        Intake/Output Summary (Last 24 hours) at 08/27/2021 1652 Last data filed at 08/27/2021 1651 Gross per 24 hour  Intake 480 ml  Output 1151 ml  Net -671 ml   Last 3 Weights 08/22/2021 08/22/2021 08/21/2021  Weight (lbs) 248 lb 3.2 oz 250 lb 250 lb  Weight (kg) 112.583 kg 113.399 kg 113.399 kg      Telemetry    Normal sinus rhythm- Personally Reviewed  ECG    - Personally Reviewed  Physical Exam   Constitutional:  oriented to person, place, and time. No distress.  HENT:  Head: Grossly normal Eyes:  no discharge. No scleral icterus.  Neck:  JVD 8 cm, no carotid bruits  Cardiovascular: Regular rate and rhythm, no murmurs appreciated Pulmonary/Chest: Scattered Rales Abdominal: Soft.  no distension.  no tenderness.  Musculoskeletal: Normal range of motion Neurological:  normal muscle tone. Coordination normal. No atrophy Skin:  Skin warm and dry Psychiatric: normal affect, pleasant   Labs    High Sensitivity Troponin:   Recent Labs  Lab 08/22/21 0100 08/22/21 0500 08/22/21 1630 08/22/21 2202 08/23/21 0555  TROPONINIHS 1,252* 1,894* 1,244* 1,082* 877*     Chemistry Recent Labs  Lab 08/25/21 0623 08/26/21 0739 08/27/21 0555  NA 138 137 138  K 3.8 3.7 3.9  CL 107 105 106  CO2 24 26 27   GLUCOSE 111* 120* 111*  BUN 12 11 10   CREATININE 0.91 0.83 0.85  CALCIUM 8.2* 8.2* 8.4*  MG 1.9 1.7 1.7  PROT 6.1* 6.2* 6.4*  ALBUMIN 3.0* 3.2* 3.2*  AST 13* 12* 10*  ALT 9 9 11   ALKPHOS 65 66 65  BILITOT 1.7* 1.6* 1.6*  GFRNONAA >60 >60 >60  ANIONGAP 7 6 5     Lipids  Recent Labs  Lab 08/23/21 0555   CHOL 55  TRIG 56  HDL 19*  LDLCALC 25  CHOLHDL 2.9    Hematology Recent Labs  Lab 08/25/21 0623 08/26/21 0739 08/27/21 0555  WBC 7.9 8.2 9.8  RBC 2.87* 2.94* 2.86*  HGB 8.3* 8.4* 8.5*  HCT 26.2* 26.2* 26.3*  MCV 91.3 89.1 92.0  MCH 28.9 28.6 29.7  MCHC 31.7 32.1 32.3  RDW 16.1* 16.6* 16.5*  PLT 78* 88* 88*   Thyroid No results for input(s): TSH, FREET4 in the last 168 hours.  BNP Recent Labs  Lab 08/22/21 0500  BNP 574.8*    DDimer  Recent Labs  Lab 08/24/21 0507 08/25/21 0623 08/26/21 0739  DDIMER 0.34 0.50 0.50     Radiology    CARDIAC CATHETERIZATION  Result Date: 08/26/2021   Dist LAD lesion is 80% stenosed.   Prox LAD-1 lesion is 10% stenosed.   Prox Cx lesion is 50% stenosed.   Dist RCA lesion is 100% stenosed.   Ost 1st Diag lesion is 90% stenosed.   Prox RCA lesion is 99% stenosed.   Ost 3rd Mrg to 3rd Mrg lesion is 50% stenosed.   Mid LAD lesion is 10% stenosed.   Ost LM lesion is 20% stenosed.   Non-stenotic Prox LAD-2 lesion was previously treated.   The left ventricular systolic function is normal.   LV end diastolic pressure is moderately elevated.   The left ventricular ejection fraction is 50-55% by visual estimate. 1.  Patent LAD stents with no significant restenosis.  Chronically occluded right coronary artery with left-to-right collaterals and moderate left circumflex disease.  No significant change in coronary anatomy since most recent cardiac catheterization. 2.  Low normal LV systolic function with an EF of 50 to 55%.  Moderately elevated left ventricular end-diastolic pressure at 22 mmHg. Recommendations: Elevated troponin is likely due to supply demand ischemia. Recommend continuing medical therapy. Eliquis can be resumed tonight. The patient is volume overloaded and I elected to resume furosemide 40 mg by mouth twice daily.    Cardiac Studies     Patient Profile     80 year old male with known history of coronary artery disease status post  PCI, chronic atrial fibrillation on anticoagulation with Eliquis, type 2 diabetes, GERD, hyperlipidemia, essential hypertension, previous PE and sleep apnea, who was admitted 12/17 w/ resp failure, chest pain, and COVID19 infection, and ruled in for NSTEMI (1252  1894  1244  1082  877).  Assessment & Plan     1.  NSTEMI/CAD:   Catheterization yesterday with patent stents Medical management recommended Felt to be demand ischemia in the setting of respiratory  distress Continue aspirin, statin, beta-blocker  2  Acute on chronic respiratory distress in the setting of COVID and pulmonary edema ---Lasix as below  3.  Essential HTN:   stable on ? blocker and acei.  4.  HL:   LDL of 25.  Continue statin, numbers at goal   5.  Permanent Afib:  Rate controlled 70's to 90's.   On ? blocker.  Eliquis on hold pending catheterization results   6.  DMII:   per IM.  A1c 6.6.   7.  HFpEF:   EF 50-55% by echo this admission w/ RVSP of 53.6.   Acute on chronic respiratory distress contributing to presentation, demand ischemia Reports high fluid intake at home, CHF discussion discussed with him Given minimal urine output on Lasix, will give dose of IV this afternoon  8.  Normocytic anemia:   H/H remain low, but stable. Likely contributing to shortness of breath symptoms Normal ferritin   Total encounter time more than 35 minutes  Greater than 50% was spent in counseling and coordination of care with the patient   For questions or updates, please contact [place] Please consult [email or web address] for contact info under        Signed, Ida Rogue, MD  08/27/2021, 4:52 PM

## 2021-08-27 NOTE — Progress Notes (Signed)
PROGRESS NOTE    Cody Price  ZOX:[id number] DOB: 15-May-1941 DOA: 08/22/2021 PCP: Baxter Hire, MD    Brief Narrative:  Cody Price is a 80 year old male with past medical history significant for CAD, type 2 diabetes mellitus, GERD, hiatal hernia, dyslipidemia, essential hypertension, Hx PE, obstructive sleep apnea who presented to [facility] ED on 12/17 with acute onset chest pain.  Localized in the midsternal region, described as a pressure-like sensation 5/10 in severity with no radiation.  In the ED, COVID-19 PCR positive.  High sensitive troponin was noted to be elevated.  Chest x-ray with cardiomegaly and trace bilateral pleural effusions.  EKG with atrial fibrillation, PVCs.  Cardiology was consulted.  Started on heparin drip.  TRH consulted for further evaluation and management of acute onset chest pain and Covid-19 viral infection.   Assessment & Plan:   Principal Problem:   NSTEMI (non-ST elevated myocardial infarction) ([place]) Active Problems:   Hyperlipidemia   Coronary atherosclerosis   ATRIAL FIBRILLATION   Diabetes mellitus ([place])   Hx of pulmonary embolus   Hypertension   NSTEMI 2/2 type II demand ischemia Hx CAD s/p PCI/stent Patient presenting with pressure-like chest pain.  Troponin elevated on admission and peaked at 1008 or 94.  History of CAD s/p stenting LAD.  Cardiology was consulted, started on heparin drip and patient underwent left heart catheterization on 08/26/2021 with findings of patent LAD stents with no significant restenosis, chronically occluded RCA with collaterals, moderate disease left circumflex disease with no significant change in coronary arteries since last heart catheterization. --Cardiology following, appreciate assistance --Heparin drip now transitioned back to Eliquis  COVID-19 viral infection Upon admission, COVID-19 PCR positive on 08/21/2021.  Apparently has been having symptoms for roughly 2 weeks.Chest x-ray with cardiomegaly,  trace pleural effusions with no focal opacity/consolidation.  Completed 3-day course of IV remdesivir during hospitalization.  Oxygenating well on room air. --CRP 2.4>3.5>5.0>5.7>4.3 --Vitamin C, zinc, Mucinex, Tussionex as needed  Essential hypertension Chronic diastolic congestive heart failure BNP elevated on admission, 574.8.  Chest x-ray with cardiomegaly and trace bilateral pleural effusions.  TTE 08/24/2021 with LVEF 50-55%, LV low normal function, no regional wall motion abnormalities LV, LV internal cavity mildly dilated, mild LVH, RV systolic function normal, RVSP 53.6 mmHg, LA moderately dilated, RA moderately dilated, moderate MR, no aortic stenosis. --Wt 113.4>112.6kg --Furosemide 40 mg p.o. twice daily --Metoprolol succinate 50 mg p.o. daily --Lisinopril 5 mg p.o. daily --Strict I's and O's and daily weights  Hyperlipidemia: Atorvastatin 40 mg p.o. daily, Lovaza 2g PO daily  Paroxysmal atrial fibrillation --Metoprolol succinate 50 mg p.o. daily --Eliquis 5 mg p.o. twice daily  Hx of pulmonary embolism --Continue Eliquis  Type 2 diabetes mellitus At baseline on glimepiride 4 mg p.o. twice daily. --Hold oral hypoglycemics while inpatient --SSI for coverage --CBGs qAC/HS  GERD:  --Protonix 80 mg p.o. BID (Prilosec 40mg  BID at home) --carafate 1g PO TIDAC  BPH: Tamsulosin 0.4 mg p.o. daily, finasteride 5 mg p.o. daily  Anxiety: --Diazepam 5 mg p.o. 3 times daily as needed   DVT prophylaxis:  apixaban (ELIQUIS) tablet 5 mg    Code Status: DNR Family Communication: No family present at bedside this morning  Disposition Plan:  Level of care: Progressive Status is: Inpatient  Remains inpatient appropriate because: Awaiting further cardiology recommendations, hopeful for discharge likely tomorrow    Consultants:  Cardiology  Procedures:  Left heart catheterization 12/21  Antimicrobials:  Remdesivir 12/16 - 12/19   Subjective: Patient seen examined at  bedside, resting comfortably.  No specific complaints this morning.  Reports that his leg swelling improved today.  States cardiology has been in earlier and stated that he wants to diurese more today with possible discharge tomorrow.  Patient appreciative all the care he has received during this hospitalization.  No other questions or concerns at this time.  Denies headache, no shortness of breath, no chest pain currently, no fever/chills/night sweats, no nausea/vomiting/diarrhea, no weakness, no fatigue, no paresthesias.  No acute events overnight per nursing staff.   Objective: Vitals:   08/26/21 2009 08/26/21 2342 08/27/21 0508 08/27/21 0756  BP: (!) 130/53 (!) 98/58 121/77 126/71  Pulse: 91 79 83 83  Resp: 20 19 20 18   Temp: 98.2 F (36.8 C) 98.2 F (36.8 C) 98 F (36.7 C) 98.1 F (36.7 C)  TempSrc:   Oral Oral  SpO2: 99% 99% 100% 99%  Weight:      Height:        Intake/Output Summary (Last 24 hours) at 08/27/2021 1044 Last data filed at 08/27/2021 1015 Gross per 24 hour  Intake 240 ml  Output 401 ml  Net -161 ml   Filed Weights   08/21/21 2207 08/22/21 0957 08/22/21 2213  Weight: 113.4 kg 113.4 kg 112.6 kg    Examination:  General exam: Appears calm and comfortable  Respiratory system: Clear to auscultation. Respiratory effort normal.  On room air Cardiovascular system: S1 & S2 heard, RRR. No JVD, murmurs, rubs, gallops or clicks.  Trace lower extremity edema. Gastrointestinal system: Abdomen is nondistended, soft and nontender. No organomegaly or masses felt. Normal bowel sounds heard. Central nervous system: Alert and oriented. No focal neurological deficits. Extremities: Symmetric 5 x 5 power. Skin: No rashes, lesions or ulcers Psychiatry: Judgement and insight appear normal. Mood & affect appropriate.     Data Reviewed: I have personally reviewed following labs and imaging studies  CBC: Recent Labs  Lab 08/23/21 0555 08/24/21 0507 08/25/21 0623  08/26/21 0739 08/27/21 0555  WBC 7.4 7.4 7.9 8.2 9.8  NEUTROABS 3.3 3.1 4.9 4.5 5.0  HGB 8.6* 8.3* 8.3* 8.4* 8.5*  HCT 26.0* 25.6* 26.2* 26.2* 26.3*  MCV 89.7 91.1 91.3 89.1 92.0  PLT 84* 82* 78* 88* 88*   Basic Metabolic Panel: Recent Labs  Lab 08/23/21 0555 08/24/21 0507 08/25/21 0623 08/26/21 0739 08/27/21 0555  NA 138 138 138 137 138  K 3.2* 3.3* 3.8 3.7 3.9  CL 107 107 107 105 106  CO2 25 24 24 26 27   GLUCOSE 97 108* 111* 120* 111*  BUN 10 12 12 11 10   CREATININE 0.92 0.89 0.91 0.83 0.85  CALCIUM 8.1* 8.0* 8.2* 8.2* 8.4*  MG 1.5* 1.8 1.9 1.7 1.7   GFR: Estimated Creatinine Clearance: 92.7 mL/min (by C-G formula based on SCr of 0.85 mg/dL). Liver Function Tests: Recent Labs  Lab 08/23/21 0555 08/24/21 0507 08/25/21 0623 08/26/21 0739 08/27/21 0555  AST 15 12* 13* 12* 10*  ALT 9 9 9 9 11   ALKPHOS 56 66 65 66 65  BILITOT 2.0* 1.7* 1.7* 1.6* 1.6*  PROT 6.1* 6.1* 6.1* 6.2* 6.4*  ALBUMIN 3.1* 2.9* 3.0* 3.2* 3.2*   No results for input(s): LIPASE, AMYLASE in the last 168 hours. No results for input(s): AMMONIA in the last 168 hours. Coagulation Profile: Recent Labs  Lab 08/22/21 0257  INR 1.6*   Cardiac Enzymes: No results for input(s): CKTOTAL, CKMB, CKMBINDEX, TROPONINI in the last 168 hours. BNP (last 3 results) No results for input(s):  PROBNP in the last 8760 hours. HbA1C: No results for input(s): HGBA1C in the last 72 hours. CBG: Recent Labs  Lab 08/25/21 2127 08/26/21 0808 08/26/21 1134 08/26/21 2013 08/27/21 0755  GLUCAP 149* 108* 93 242* 89   Lipid Profile: No results for input(s): CHOL, HDL, LDLCALC, TRIG, CHOLHDL, LDLDIRECT in the last 72 hours. Thyroid Function Tests: No results for input(s): TSH, T4TOTAL, FREET4, T3FREE, THYROIDAB in the last 72 hours. Anemia Panel: Recent Labs    08/25/21 0623 08/26/21 0739  FERRITIN 148 137   Sepsis Labs: Recent Labs  Lab 08/22/21 0500  PROCALCITON <0.10    Recent Results (from the  past 240 hour(s))  Resp Panel by RT-PCR (Flu A&B, Covid) Nasopharyngeal Swab     Status: Abnormal   Collection Time: 08/21/21 10:12 PM   Specimen: Nasopharyngeal Swab; Nasopharyngeal(NP) swabs in vial transport medium  Result Value Ref Range Status   SARS Coronavirus 2 by RT PCR POSITIVE (A) NEGATIVE Final    Comment: (NOTE) SARS-CoV-2 target nucleic acids are DETECTED.  The SARS-CoV-2 RNA is generally detectable in upper respiratory specimens during the acute phase of infection. Positive results are indicative of the presence of the identified virus, but do not rule out bacterial infection or co-infection with other pathogens not detected by the test. Clinical correlation with patient history and other diagnostic information is necessary to determine patient infection status. The expected result is Negative.  Fact Sheet for Patients: EntrepreneurPulse.com.au  Fact Sheet for Healthcare Providers: IncredibleEmployment.be  This test is not yet approved or cleared by the [place] FDA and  has been authorized for detection and/or diagnosis of SARS-CoV-2 by FDA under an Emergency Use Authorization (EUA).  This EUA will remain in effect (meaning this test can be used) for the duration of  the COVID-19 declaration under Section 564(b)(1) of the A ct, 21 U.S.C. section 360bbb-3(b)(1), unless the authorization is terminated or revoked sooner.     Influenza A by PCR NEGATIVE NEGATIVE Final   Influenza B by PCR NEGATIVE NEGATIVE Final    Comment: (NOTE) The Xpert Xpress SARS-CoV-2/FLU/RSV plus assay is intended as an aid in the diagnosis of influenza from Nasopharyngeal swab specimens and should not be used as a sole basis for treatment. Nasal washings and aspirates are unacceptable for Xpert Xpress SARS-CoV-2/FLU/RSV testing.  Fact Sheet for Patients: EntrepreneurPulse.com.au  Fact Sheet for Healthcare  Providers: IncredibleEmployment.be  This test is not yet approved or cleared by the [place] FDA and has been authorized for detection and/or diagnosis of SARS-CoV-2 by FDA under an Emergency Use Authorization (EUA). This EUA will remain in effect (meaning this test can be used) for the duration of the COVID-19 declaration under Section 564(b)(1) of the Act, 21 U.S.C. section 360bbb-3(b)(1), unless the authorization is terminated or revoked.  Performed at [facility], [address] [place],  [address]          Radiology Studies: CARDIAC CATHETERIZATION  Result Date: 08/26/2021   Dist LAD lesion is 80% stenosed.   Prox LAD-1 lesion is 10% stenosed.   Prox Cx lesion is 50% stenosed.   Dist RCA lesion is 100% stenosed.   Ost 1st Diag lesion is 90% stenosed.   Prox RCA lesion is 99% stenosed.   Ost 3rd Mrg to 3rd Mrg lesion is 50% stenosed.   Mid LAD lesion is 10% stenosed.   Ost LM lesion is 20% stenosed.   Non-stenotic Prox LAD-2 lesion was previously treated.   The left ventricular systolic  function is normal.   LV end diastolic pressure is moderately elevated.   The left ventricular ejection fraction is 50-55% by visual estimate. 1.  Patent LAD stents with no significant restenosis.  Chronically occluded right coronary artery with left-to-right collaterals and moderate left circumflex disease.  No significant change in coronary anatomy since most recent cardiac catheterization. 2.  Low normal LV systolic function with an EF of 50 to 55%.  Moderately elevated left ventricular end-diastolic pressure at 22 mmHg. Recommendations: Elevated troponin is likely due to supply demand ischemia. Recommend continuing medical therapy. Eliquis can be resumed tonight. The patient is volume overloaded and I elected to resume furosemide 40 mg by mouth twice daily.        Scheduled Meds:  apixaban  5 mg Oral BID   vitamin C  500 mg Oral Daily   aspirin EC  81  mg Oral Daily   atorvastatin  40 mg Oral Daily   cholecalciferol  1,000 Units Oral Daily   cyanocobalamin  1,000 mcg Intramuscular Q30 days   finasteride  5 mg Oral Daily   fluticasone  2 spray Each Nare Daily   furosemide  40 mg Oral BID   guaiFENesin  600 mg Oral BID   insulin aspart  0-15 Units Subcutaneous TID AC & HS   lisinopril  5 mg Oral Daily   loratadine  10 mg Oral Daily   metoprolol succinate  50 mg Oral Q breakfast   multivitamin with minerals  1 tablet Oral Daily   omega-3 acid ethyl esters  2 g Oral Daily   pantoprazole  80 mg Oral BID   potassium chloride SA  10 mEq Oral Daily   sodium chloride flush  3 mL Intravenous Q12H   sodium chloride flush  3 mL Intravenous Q12H   sucralfate  1 g Oral TID WC & HS   tamsulosin  0.4 mg Oral Daily   zinc sulfate  220 mg Oral Daily   Continuous Infusions:  sodium chloride     sodium chloride 10 mL/hr at 08/26/21 8563   sodium chloride       LOS: 5 days    Time spent: 38 minutes spent on chart review, discussion with nursing staff, consultants, updating family and interview/physical exam; more than 50% of that time was spent in counseling and/or coordination of care.    Acen Craun J [place] DO [facility] Available via Epic secure chat 7am-7pm After these hours, please refer to coverage provider listed on [email or web address] 08/27/2021, 10:44 AM

## 2021-08-28 DIAGNOSIS — I10 Essential (primary) hypertension: Secondary | ICD-10-CM

## 2021-08-28 DIAGNOSIS — J40 Bronchitis, not specified as acute or chronic: Secondary | ICD-10-CM

## 2021-08-28 LAB — GLUCOSE, CAPILLARY
Glucose-Capillary: 196 mg/dL — ABNORMAL HIGH (ref 70–99)
Glucose-Capillary: 166 mg/dL — ABNORMAL HIGH (ref 70–99)
Glucose-Capillary: 202 mg/dL — ABNORMAL HIGH (ref 70–99)

## 2021-08-28 MED ORDER — FLUTICASONE PROPIONATE 50 MCG/ACT NA SUSP
2.0000 | Freq: Every day | NASAL | 3 refills | Status: AC
Start: 2021-08-29 — End: 2021-11-27

## 2021-08-28 NOTE — Progress Notes (Signed)
PROGRESS NOTE    Cody Price  PPI:[id number] DOB: Mar 17, 1941 DOA: 08/22/2021 PCP: Baxter Hire, MD    Brief Narrative:  Cody Price is a 79 year old male with past medical history significant for CAD, type 2 diabetes mellitus, GERD, hiatal hernia, dyslipidemia, essential hypertension, Hx PE, obstructive sleep apnea who presented to [facility] ED on 12/17 with acute onset chest pain.  Localized in the midsternal region, described as a pressure-like sensation 5/10 in severity with no radiation.  In the ED, COVID-19 PCR positive.  High sensitive troponin was noted to be elevated.  Chest x-ray with cardiomegaly and trace bilateral pleural effusions.  EKG with atrial fibrillation, PVCs.  Cardiology was consulted.  Started on heparin drip.  TRH consulted for further evaluation and management of acute onset chest pain and Covid-19 viral infection.   Assessment & Plan:   Principal Problem:   NSTEMI (non-ST elevated myocardial infarction) ([place]) Active Problems:   Hyperlipidemia   Coronary atherosclerosis   ATRIAL FIBRILLATION   Diabetes mellitus ([place])   Hx of pulmonary embolus   Hypertension   NSTEMI 2/2 type II demand ischemia Hx CAD s/p PCI/stent Patient presenting with pressure-like chest pain.  Troponin elevated on admission and peaked at 1008 or 94.  History of CAD s/p stenting LAD.  Cardiology was consulted, started on heparin drip and patient underwent left heart catheterization on 08/26/2021 with findings of patent LAD stents with no significant restenosis, chronically occluded RCA with collaterals, moderate disease left circumflex disease with no significant change in coronary arteries since last heart catheterization. --Cardiology following, appreciate assistance --Heparin drip now transitioned back to Eliquis  COVID-19 viral infection Upon admission, COVID-19 PCR positive on 08/21/2021.  Apparently has been having symptoms for roughly 2 weeks.Chest x-ray with cardiomegaly,  trace pleural effusions with no focal opacity/consolidation.  Completed 3-day course of IV remdesivir during hospitalization.  Oxygenating well on room air. --CRP 2.4>3.5>5.0>5.7>4.3 --Vitamin C, zinc, Mucinex, Tussionex as needed  Essential hypertension Chronic diastolic congestive heart failure BNP elevated on admission, 574.8.  Chest x-ray with cardiomegaly and trace bilateral pleural effusions.  TTE 08/24/2021 with LVEF 50-55%, LV low normal function, no regional wall motion abnormalities LV, LV internal cavity mildly dilated, mild LVH, RV systolic function normal, RVSP 53.6 mmHg, LA moderately dilated, RA moderately dilated, moderate MR, no aortic stenosis. --Wt 113.4>112.6>118.1kg --net negative 863mL past 24h with 2 unmeasured urinary occurances --Furosemide 40 mg IV twice daily --Metoprolol succinate 50 mg p.o. daily --Lisinopril 5 mg p.o. daily --Strict I's and O's and daily weights  Hyperlipidemia: Atorvastatin 40 mg p.o. daily, Lovaza 2g PO daily  Paroxysmal atrial fibrillation --Metoprolol succinate 50 mg p.o. daily --Eliquis 5 mg p.o. twice daily  Hx of pulmonary embolism --Continue Eliquis  Type 2 diabetes mellitus At baseline on glimepiride 4 mg p.o. twice daily. --Hold oral hypoglycemics while inpatient --SSI for coverage --CBGs qAC/HS  GERD:  --Protonix 80 mg p.o. BID (Prilosec 40mg  BID at home) --carafate 1g PO TIDAC  BPH: Tamsulosin 0.4 mg p.o. daily, finasteride 5 mg p.o. daily  Anxiety: --Diazepam 5 mg p.o. 3 times daily as needed   DVT prophylaxis:  apixaban (ELIQUIS) tablet 5 mg    Code Status: DNR Family Communication: No family present at bedside this morning  Disposition Plan:  Level of care: Progressive Status is: Inpatient  Remains inpatient appropriate because: On IV diuresis    Consultants:  Cardiology  Procedures:  Left heart catheterization 12/21  Antimicrobials:  Remdesivir 12/16 - 12/19   Subjective:  Patient seen  examined at bedside, resting comfortably.  Eating breakfast.  Seen by cardiologist morning and continues on IV diuresis for another 24 hours.  Coughed up thick sputum this morning, states Flonase is working and requesting a prescription for this on discharge.  No other questions or concerns at this time.  Denies headache, no shortness of breath, no chest pain currently, no fever/chills/night sweats, no nausea/vomiting/diarrhea, no weakness, no fatigue, no paresthesias.  No acute events overnight per nursing staff.   Objective: Vitals:   08/27/21 2010 08/28/21 0026 08/28/21 0428 08/28/21 0927  BP: 130/71 126/63 122/65 125/66  Pulse: 64 71 75 94  Resp: 18 20 16 18   Temp: 98.1 F (36.7 C) 97.9 F (36.6 C) 98.3 F (36.8 C) 98.1 F (36.7 C)  TempSrc: Oral Oral  Oral  SpO2: 95% 94% 95% 95%  Weight:   118.1 kg   Height:        Intake/Output Summary (Last 24 hours) at 08/28/2021 1215 Last data filed at 08/28/2021 0500 Gross per 24 hour  Intake 240 ml  Output 1350 ml  Net -1110 ml   Filed Weights   08/22/21 0957 08/22/21 2213 08/28/21 0428  Weight: 113.4 kg 112.6 kg 118.1 kg    Examination:  General exam: Appears calm and comfortable  Respiratory system: Clear to auscultation. Respiratory effort normal.  On room air Cardiovascular system: S1 & S2 heard, RRR. No JVD, murmurs, rubs, gallops or clicks.  Trace lower extremity edema. Gastrointestinal system: Abdomen is nondistended, soft and nontender. No organomegaly or masses felt. Normal bowel sounds heard. Central nervous system: Alert and oriented. No focal neurological deficits. Extremities: Symmetric 5 x 5 power. Skin: No rashes, lesions or ulcers Psychiatry: Judgement and insight appear normal. Mood & affect appropriate.     Data Reviewed: I have personally reviewed following labs and imaging studies  CBC: Recent Labs  Lab 08/23/21 0555 08/24/21 0507 08/25/21 0623 08/26/21 0739 08/27/21 0555  WBC 7.4 7.4 7.9 8.2 9.8   NEUTROABS 3.3 3.1 4.9 4.5 5.0  HGB 8.6* 8.3* 8.3* 8.4* 8.5*  HCT 26.0* 25.6* 26.2* 26.2* 26.3*  MCV 89.7 91.1 91.3 89.1 92.0  PLT 84* 82* 78* 88* 88*   Basic Metabolic Panel: Recent Labs  Lab 08/23/21 0555 08/24/21 0507 08/25/21 0623 08/26/21 0739 08/27/21 0555  NA 138 138 138 137 138  K 3.2* 3.3* 3.8 3.7 3.9  CL 107 107 107 105 106  CO2 25 24 24 26 27   GLUCOSE 97 108* 111* 120* 111*  BUN 10 12 12 11 10   CREATININE 0.92 0.89 0.91 0.83 0.85  CALCIUM 8.1* 8.0* 8.2* 8.2* 8.4*  MG 1.5* 1.8 1.9 1.7 1.7   GFR: Estimated Creatinine Clearance: 94.9 mL/min (by C-G formula based on SCr of 0.85 mg/dL). Liver Function Tests: Recent Labs  Lab 08/23/21 0555 08/24/21 0507 08/25/21 0623 08/26/21 0739 08/27/21 0555  AST 15 12* 13* 12* 10*  ALT 9 9 9 9 11   ALKPHOS 56 66 65 66 65  BILITOT 2.0* 1.7* 1.7* 1.6* 1.6*  PROT 6.1* 6.1* 6.1* 6.2* 6.4*  ALBUMIN 3.1* 2.9* 3.0* 3.2* 3.2*   No results for input(s): LIPASE, AMYLASE in the last 168 hours. No results for input(s): AMMONIA in the last 168 hours. Coagulation Profile: Recent Labs  Lab 08/22/21 0257  INR 1.6*   Cardiac Enzymes: No results for input(s): CKTOTAL, CKMB, CKMBINDEX, TROPONINI in the last 168 hours. BNP (last 3 results) No results for input(s): PROBNP in the last 8760 hours.  HbA1C: No results for input(s): HGBA1C in the last 72 hours. CBG: Recent Labs  Lab 08/26/21 2013 08/27/21 0755 08/27/21 1219 08/27/21 1646 08/27/21 2122  GLUCAP 242* 89 127* 181* 150*   Lipid Profile: No results for input(s): CHOL, HDL, LDLCALC, TRIG, CHOLHDL, LDLDIRECT in the last 72 hours. Thyroid Function Tests: No results for input(s): TSH, T4TOTAL, FREET4, T3FREE, THYROIDAB in the last 72 hours. Anemia Panel: Recent Labs    08/26/21 0739  FERRITIN 137   Sepsis Labs: Recent Labs  Lab 08/22/21 0500  PROCALCITON <0.10    Recent Results (from the past 240 hour(s))  Resp Panel by RT-PCR (Flu A&B, Covid) Nasopharyngeal  Swab     Status: Abnormal   Collection Time: 08/21/21 10:12 PM   Specimen: Nasopharyngeal Swab; Nasopharyngeal(NP) swabs in vial transport medium  Result Value Ref Range Status   SARS Coronavirus 2 by RT PCR POSITIVE (A) NEGATIVE Final    Comment: (NOTE) SARS-CoV-2 target nucleic acids are DETECTED.  The SARS-CoV-2 RNA is generally detectable in upper respiratory specimens during the acute phase of infection. Positive results are indicative of the presence of the identified virus, but do not rule out bacterial infection or co-infection with other pathogens not detected by the test. Clinical correlation with patient history and other diagnostic information is necessary to determine patient infection status. The expected result is Negative.  Fact Sheet for Patients: EntrepreneurPulse.com.au  Fact Sheet for Healthcare Providers: IncredibleEmployment.be  This test is not yet approved or cleared by the [place] FDA and  has been authorized for detection and/or diagnosis of SARS-CoV-2 by FDA under an Emergency Use Authorization (EUA).  This EUA will remain in effect (meaning this test can be used) for the duration of  the COVID-19 declaration under Section 564(b)(1) of the A ct, 21 U.S.C. section 360bbb-3(b)(1), unless the authorization is terminated or revoked sooner.     Influenza A by PCR NEGATIVE NEGATIVE Final   Influenza B by PCR NEGATIVE NEGATIVE Final    Comment: (NOTE) The Xpert Xpress SARS-CoV-2/FLU/RSV plus assay is intended as an aid in the diagnosis of influenza from Nasopharyngeal swab specimens and should not be used as a sole basis for treatment. Nasal washings and aspirates are unacceptable for Xpert Xpress SARS-CoV-2/FLU/RSV testing.  Fact Sheet for Patients: EntrepreneurPulse.com.au  Fact Sheet for Healthcare Providers: IncredibleEmployment.be  This test is not yet approved or cleared  by the [place] FDA and has been authorized for detection and/or diagnosis of SARS-CoV-2 by FDA under an Emergency Use Authorization (EUA). This EUA will remain in effect (meaning this test can be used) for the duration of the COVID-19 declaration under Section 564(b)(1) of the Act, 21 U.S.C. section 360bbb-3(b)(1), unless the authorization is terminated or revoked.  Performed at [facility], [address], [place], [place] [address]          Radiology Studies: No results found.      Scheduled Meds:  apixaban  5 mg Oral BID   vitamin C  500 mg Oral Daily   aspirin EC  81 mg Oral Daily   atorvastatin  40 mg Oral Daily   cholecalciferol  1,000 Units Oral Daily   cyanocobalamin  1,000 mcg Intramuscular Q30 days   finasteride  5 mg Oral Daily   fluticasone  2 spray Each Nare Daily   furosemide  40 mg Intravenous BID   guaiFENesin  600 mg Oral BID   insulin aspart  0-15 Units Subcutaneous TID AC & HS  lisinopril  5 mg Oral Daily   loratadine  10 mg Oral Daily   metoprolol succinate  50 mg Oral Q breakfast   multivitamin with minerals  1 tablet Oral Daily   omega-3 acid ethyl esters  2 g Oral Daily   pantoprazole  80 mg Oral BID   potassium chloride SA  10 mEq Oral Daily   sodium chloride flush  3 mL Intravenous Q12H   sodium chloride flush  3 mL Intravenous Q12H   sucralfate  1 g Oral TID WC & HS   tamsulosin  0.4 mg Oral Daily   zinc sulfate  220 mg Oral Daily   Continuous Infusions:  sodium chloride     sodium chloride 10 mL/hr at 08/26/21 9702   sodium chloride       LOS: 6 days    Time spent: 37 minutes spent on chart review, discussion with nursing staff, consultants, updating family and interview/physical exam; more than 50% of that time was spent in counseling and/or coordination of care.    Kemal Amores J [place] DO [facility] Available via Epic secure chat 7am-7pm After these hours, please refer to coverage provider listed on  [email or web address] 08/28/2021, 12:15 PM

## 2021-08-28 NOTE — Progress Notes (Signed)
Progress Note  Patient Name: Cody Price Date of Encounter: 08/28/2021  [facility] Cardiologist: Ida Rogue, MD   Subjective   On entering into the room, tremendous thick bronchospastic cough, loose, spitting up sputum and a napkin " I am getting better" Reports breathing little bit better compared to yesterday  Oral Lasix transition to IV Lasix given elevated left ventricular end-diastolic pressure on catheterization and poor urine output on p.o. Lasix Unclear ins and outs accurately measured Stable renal function  High fluid intake at home  Denies chest pain concerning for angina recent cardiac catheterization 2 days ago  Reports feeling somewhat better, coughing is slowly improving Still not at baseline Catheterization results yesterday discussed with him: Patent LAD stents, no restenosis, chronically occluded right coronary artery left to right collaterals moderate left circumflex disease ejection fraction 50 to 55% with moderately elevated left ventricular end-diastolic pressure --Felt to be demand ischemia   Inpatient Medications    Scheduled Meds:  apixaban  5 mg Oral BID   vitamin C  500 mg Oral Daily   aspirin EC  81 mg Oral Daily   atorvastatin  40 mg Oral Daily   cholecalciferol  1,000 Units Oral Daily   cyanocobalamin  1,000 mcg Intramuscular Q30 days   finasteride  5 mg Oral Daily   fluticasone  2 spray Each Nare Daily   furosemide  40 mg Intravenous BID   guaiFENesin  600 mg Oral BID   insulin aspart  0-15 Units Subcutaneous TID AC & HS   lisinopril  5 mg Oral Daily   loratadine  10 mg Oral Daily   metoprolol succinate  50 mg Oral Q breakfast   multivitamin with minerals  1 tablet Oral Daily   omega-3 acid ethyl esters  2 g Oral Daily   pantoprazole  80 mg Oral BID   potassium chloride SA  10 mEq Oral Daily   sodium chloride flush  3 mL Intravenous Q12H   sodium chloride flush  3 mL Intravenous Q12H   sucralfate  1 g Oral TID WC & HS    tamsulosin  0.4 mg Oral Daily   zinc sulfate  220 mg Oral Daily   Continuous Infusions:  sodium chloride     sodium chloride 10 mL/hr at 08/26/21 9381   sodium chloride     PRN Meds: sodium chloride, sodium chloride, oxyCODONE **AND** acetaminophen, acetaminophen, chlorpheniramine-HYDROcodone, diazepam, guaiFENesin-dextromethorphan, magnesium hydroxide, ondansetron **OR** ondansetron (ZOFRAN) IV, sodium chloride flush, sodium chloride flush, traZODone   Vital Signs    Vitals:   08/27/21 2010 08/28/21 0026 08/28/21 0428 08/28/21 0927  BP: 130/71 126/63 122/65 125/66  Pulse: 64 71 75 94  Resp: 18 20 16 18   Temp: 98.1 F (36.7 C) 97.9 F (36.6 C) 98.3 F (36.8 C) 98.1 F (36.7 C)  TempSrc: Oral Oral  Oral  SpO2: 95% 94% 95% 95%  Weight:   118.1 kg   Height:        Intake/Output Summary (Last 24 hours) at 08/28/2021 1134 Last data filed at 08/28/2021 0500 Gross per 24 hour  Intake 240 ml  Output 1350 ml  Net -1110 ml   Last 3 Weights 08/28/2021 08/22/2021 08/22/2021  Weight (lbs) 260 lb 6.4 oz 248 lb 3.2 oz 250 lb  Weight (kg) 118.117 kg 112.583 kg 113.399 kg      Telemetry    Atrial fibrillation- Personally Reviewed  ECG    - Personally Reviewed  Physical Exam   Constitutional:  oriented  to person, place, and time. No distress.  HENT:  Head: Grossly normal Eyes:  no discharge. No scleral icterus.  Neck:  JVD 8 cm, no carotid bruits  Cardiovascular: Irregularly irregular no murmurs appreciated Pulmonary/Chest: Coarse breath sounds, scattered Rales Abdominal: Soft.  no distension.  no tenderness.  Musculoskeletal: Normal range of motion Neurological:  normal muscle tone. Coordination normal. No atrophy Skin: Skin warm and dry Psychiatric: normal affect, pleasant   Labs    High Sensitivity Troponin:   Recent Labs  Lab 08/22/21 0100 08/22/21 0500 08/22/21 1630 08/22/21 2202 08/23/21 0555  TROPONINIHS 1,252* 1,894* 1,244* 1,082* 877*      Chemistry Recent Labs  Lab 08/25/21 0623 08/26/21 0739 08/27/21 0555  NA 138 137 138  K 3.8 3.7 3.9  CL 107 105 106  CO2 24 26 27   GLUCOSE 111* 120* 111*  BUN 12 11 10   CREATININE 0.91 0.83 0.85  CALCIUM 8.2* 8.2* 8.4*  MG 1.9 1.7 1.7  PROT 6.1* 6.2* 6.4*  ALBUMIN 3.0* 3.2* 3.2*  AST 13* 12* 10*  ALT 9 9 11   ALKPHOS 65 66 65  BILITOT 1.7* 1.6* 1.6*  GFRNONAA >60 >60 >60  ANIONGAP 7 6 5     Lipids  Recent Labs  Lab 08/23/21 0555  CHOL 55  TRIG 56  HDL 19*  LDLCALC 25  CHOLHDL 2.9    Hematology Recent Labs  Lab 08/25/21 0623 08/26/21 0739 08/27/21 0555  WBC 7.9 8.2 9.8  RBC 2.87* 2.94* 2.86*  HGB 8.3* 8.4* 8.5*  HCT 26.2* 26.2* 26.3*  MCV 91.3 89.1 92.0  MCH 28.9 28.6 29.7  MCHC 31.7 32.1 32.3  RDW 16.1* 16.6* 16.5*  PLT 78* 88* 88*   Thyroid No results for input(s): TSH, FREET4 in the last 168 hours.  BNP Recent Labs  Lab 08/22/21 0500  BNP 574.8*    DDimer  Recent Labs  Lab 08/24/21 0507 08/25/21 0623 08/26/21 0739  DDIMER 0.34 0.50 0.50     Radiology    CARDIAC CATHETERIZATION  Result Date: 08/26/2021   Dist LAD lesion is 80% stenosed.   Prox LAD-1 lesion is 10% stenosed.   Prox Cx lesion is 50% stenosed.   Dist RCA lesion is 100% stenosed.   Ost 1st Diag lesion is 90% stenosed.   Prox RCA lesion is 99% stenosed.   Ost 3rd Mrg to 3rd Mrg lesion is 50% stenosed.   Mid LAD lesion is 10% stenosed.   Ost LM lesion is 20% stenosed.   Non-stenotic Prox LAD-2 lesion was previously treated.   The left ventricular systolic function is normal.   LV end diastolic pressure is moderately elevated.   The left ventricular ejection fraction is 50-55% by visual estimate. 1.  Patent LAD stents with no significant restenosis.  Chronically occluded right coronary artery with left-to-right collaterals and moderate left circumflex disease.  No significant change in coronary anatomy since most recent cardiac catheterization. 2.  Low normal LV systolic function with  an EF of 50 to 55%.  Moderately elevated left ventricular end-diastolic pressure at 22 mmHg. Recommendations: Elevated troponin is likely due to supply demand ischemia. Recommend continuing medical therapy. Eliquis can be resumed tonight. The patient is volume overloaded and I elected to resume furosemide 40 mg by mouth twice daily.    Cardiac Studies     Patient Profile     80 year old male with known history of coronary artery disease status post PCI, chronic atrial fibrillation on anticoagulation with Eliquis, type 2 diabetes,  GERD, hyperlipidemia, essential hypertension, previous PE and sleep apnea, who was admitted 12/17 w/ resp failure, chest pain, and COVID19 infection, and ruled in for NSTEMI ([id number]  [id number]  [id number]  [id number]  [id number]).  Assessment & Plan     1.  NSTEMI/CAD:   Catheterization 2 days ago with patent stents Continue aspirin, statin, beta-blocker  2  Acute on chronic respiratory distress in the setting of COVID and pulmonary edema Transition to Lasix IV yesterday twice daily Still with mild shortness of breath, heavy cough consistent with COVID bronchitis -If staying in the hospital,  would consider 1 more day IV Lasix then transition back to Lasix 40 twice daily --Mucinex, nebs, inhalers  3.  Essential HTN:   On beta-blocker, ACE inhibitor  4.  HL:   LDL of 25.  Continue statin   5.  Permanent Afib:  Rate controlled On ? blocker.   On Eliquis   6.  DMII:   per IM.  A1c 6.6.   7.  HFpEF:   EF 50-55% by echo this admission w/ RVSP of 53.6.   Acute on chronic respiratory distress contributing to presentation, demand ischemia Transition to IV Lasix twice daily yesterday given poor urine output on p.o. Lasix -If staying in the hospital ,consider 1 more day IV twice daily If patient prefers to go home, would continue Lasix 40 p.o. twice daily  8.  Normocytic anemia:   H/H remain low, but stable. Likely contributing to shortness of breath symptoms Normal  ferritin  Long discussion with him concerning need to modify his fluid intake, compliance with Lasix twice daily,  bronchitis  Total encounter time more than 35 minutes  Greater than 50% was spent in counseling and coordination of care with the patient   For questions or updates, please contact [place] Please consult [email or web address] for contact info under        Signed, Ida Rogue, MD  08/28/2021, 11:34 AM

## 2021-08-29 DIAGNOSIS — J208 Acute bronchitis due to other specified organisms: Secondary | ICD-10-CM

## 2021-08-29 DIAGNOSIS — I248 Other forms of acute ischemic heart disease: Secondary | ICD-10-CM

## 2021-08-29 LAB — BASIC METABOLIC PANEL WITH GFR
Anion gap: 6 (ref 5–15)
BUN: 11 mg/dL (ref 8–23)
CO2: 29 mmol/L (ref 22–32)
Calcium: 8.4 mg/dL — ABNORMAL LOW (ref 8.9–10.3)
Chloride: 104 mmol/L (ref 98–111)
Creatinine, Ser: 0.9 mg/dL (ref 0.61–1.24)
GFR, Estimated: >60 mL/min
Glucose, Bld: 109 mg/dL — ABNORMAL HIGH (ref 70–99)
Potassium: 3.6 mmol/L (ref 3.5–5.1)
Sodium: 139 mmol/L (ref 135–145)

## 2021-08-29 LAB — GLUCOSE, CAPILLARY: Glucose-Capillary: 121 mg/dL — ABNORMAL HIGH (ref 70–99)

## 2021-08-29 MED ORDER — ASPIRIN 81 MG PO TBEC: 81.0000 mg | DELAYED_RELEASE_TABLET | Freq: Every day | ORAL | 11 refills | Status: DC

## 2021-08-29 MED ORDER — GUAIFENESIN ER 600 MG PO TB12
600.0000 mg | ORAL_TABLET | Freq: Two times a day (BID) | ORAL | 0 refills | Status: AC
Start: 2021-08-29 — End: 2021-09-08

## 2021-08-29 MED ORDER — FUROSEMIDE 40 MG PO TABS: 40.0000 mg | ORAL_TABLET | Freq: Two times a day (BID) | ORAL | 0 refills | Status: DC

## 2021-08-29 MED ORDER — FUROSEMIDE 40 MG PO TABS: 40.0000 mg | ORAL_TABLET | Freq: Two times a day (BID) | ORAL | Status: DC

## 2021-08-29 MED ORDER — BENZONATATE 100 MG PO CAPS: 200.0000 mg | ORAL_CAPSULE | Freq: Three times a day (TID) | ORAL | 0 refills | Status: AC

## 2021-08-29 NOTE — Discharge Summary (Signed)
Physician Discharge Summary  Cody Price LHT:[id number] DOB: October 03, 1940 DOA: 08/22/2021  PCP: Baxter Hire, MD  Admit date: 08/22/2021 Discharge date: 08/29/2021  Admitted From: Home Disposition: Home  Recommendations for Outpatient Follow-up:  Follow up with PCP in 1-2 weeks Follow-up with cardiology as directed  Home Health: No Equipment/Devices: None  Discharge Condition: Stable CODE STATUS: DNR Diet recommendation: Regular  Brief/Interim Summary: Cody Price is a 80 year old male with past medical history significant for CAD, type 2 diabetes mellitus, GERD, hiatal hernia, dyslipidemia, essential hypertension, Hx PE, obstructive sleep apnea who presented to [facility] ED on 12/17 with acute onset chest pain.  Localized in the midsternal region, described as a pressure-like sensation 5/10 in severity with no radiation.   In the ED, COVID-19 PCR positive.  High sensitive troponin was noted to be elevated.  Chest x-ray with cardiomegaly and trace bilateral pleural effusions.  EKG with atrial fibrillation, PVCs.  Cardiology was consulted.  Started on heparin drip.  TRH consulted for further evaluation and management of acute onset chest pain and Covid-19 viral infection.  Given elevation in troponin and pressure-like chest pain cardiology involved for consultation.  Patient has a history of coronary artery disease status post LAD stenting.  Initially started on heparin gtt. and underwent LHC on 12/21.  Patent LAD stent with no significant restenosis.  Chronically occluded RCA with collaterals.  No new PCI.  Cardiology recommending aggressive medical management.  Heparin drip discontinued and transition back to Eliquis.  Volume status optimized with IV diuresis.  Discussed with cardiology on day of discharge.  Patient has achieved nearly dry weight.  Okay for discharge at this time.  Will resume PTA Lasix dosing of 40 mg twice daily.  Antitussives provided on discharge.  Follow-up  outpatient PCP and cardiology.    Discharge Diagnoses:  Principal Problem:   NSTEMI (non-ST elevated myocardial infarction) ([place]) Active Problems:   Hyperlipidemia   Coronary atherosclerosis   ATRIAL FIBRILLATION   Diabetes mellitus ([place])   Hx of pulmonary embolus   Hypertension NSTEMI 2/2 type II demand ischemia Hx CAD s/p PCI/stent Patient presenting with pressure-like chest pain.  Troponin elevated on admission and peaked at 1008 or 94.  History of CAD s/p stenting LAD.  Cardiology was consulted, started on heparin drip and patient underwent left heart catheterization on 08/26/2021 with findings of patent LAD stents with no significant restenosis, chronically occluded RCA with collaterals, moderate disease left circumflex disease with no significant change in coronary arteries since last heart catheterization. --Cardiology following, appreciate assistance --Heparin drip now transitioned back to Eliquis  NSTEMI 2/2 type II demand ischemia Hx CAD s/p PCI/stent Patient presenting with pressure-like chest pain.  Troponin elevated on admission and peaked at 1008 or 94.  History of CAD s/p stenting LAD.  Cardiology was consulted, started on heparin drip and patient underwent left heart catheterization on 08/26/2021 with findings of patent LAD stents with no significant restenosis, chronically occluded RCA with collaterals, moderate disease left circumflex disease with no significant change in coronary arteries since last heart catheterization. --Cardiology following, appreciate assistance --Heparin drip now transitioned back to Eliquis  Essential hypertension Chronic diastolic congestive heart failure BNP elevated on admission, 574.8.  Chest x-ray with cardiomegaly and trace bilateral pleural effusions.  TTE 08/24/2021 with LVEF 50-55%, LV low normal function, no regional wall motion abnormalities LV, LV internal cavity mildly dilated, mild LVH, RV systolic function normal, RVSP 53.6 mmHg,  LA moderately dilated, RA moderately dilated, moderate MR, no aortic stenosis. --  Wt 113.4>112.6>118.1kg -- Patient approaching euvolemia at time of discharge.  Discontinue IV diuresis.  Transition back to furosemide 40 twice daily per home dose.  Discussed with cardiology on day of discharge.   Discharge Instructions  Discharge Instructions     Diet - low sodium heart healthy   Complete by: As directed    Increase activity slowly   Complete by: As directed       Allergies as of 08/29/2021       Reactions   Novocain [procaine] Other (See Comments)   Syncope   Procaine Hcl Other (See Comments)   Passes out   Lyrica [pregabalin]    Neurontin [gabapentin]    Amitriptyline Other (See Comments)   Sleepy   Dicyclomine Other (See Comments)   Other reaction(s): Abdominal Pain   Metformin Nausea Only   Nitroglycerin Other (See Comments)   Makes Blood pressure go to low.         Medication List     STOP taking these medications    metolazone 2.5 MG tablet Commonly known as: ZAROXOLYN   oxyCODONE 5 MG immediate release tablet Commonly known as: Oxy IR/ROXICODONE       TAKE these medications    acetaminophen 500 MG tablet Commonly known as: TYLENOL Take 500 mg by mouth as needed.   apixaban 5 MG Tabs tablet Commonly known as: Eliquis Take 1 tablet (5 mg total) by mouth 2 (two) times daily.   aspirin 81 MG EC tablet Take 1 tablet (81 mg total) by mouth daily. Swallow whole. Start taking on: August 30, 2021   atorvastatin 40 MG tablet Commonly known as: LIPITOR TAKE 1 TABLET BY MOUTH DAILY AT 6 PM.   benzonatate 100 MG capsule Commonly known as: Tessalon Perles Take 2 capsules (200 mg total) by mouth 3 (three) times daily for 10 days.   Co Q-10 200 MG Caps Take 200 mg by mouth daily.   cyanocobalamin 1000 MCG/ML injection Commonly known as: (VITAMIN B-12) Inject 1,000 mcg into the muscle every 30 (thirty) days.   diazepam 5 MG tablet Commonly known  as: VALIUM Take 5 mg by mouth 3 (three) times daily as needed for anxiety or muscle spasms.   finasteride 5 MG tablet Commonly known as: PROSCAR TAKE 1 TABLET BY MOUTH EVERY DAY   fluticasone 50 MCG/ACT nasal spray Commonly known as: FLONASE Place 2 sprays into both nostrils daily.   furosemide 40 MG tablet Commonly known as: LASIX Take 1 tablet (40 mg total) by mouth 2 (two) times daily.   glimepiride 4 MG tablet Commonly known as: AMARYL Take 4 mg by mouth 2 (two) times daily.   guaiFENesin 600 MG 12 hr tablet Commonly known as: MUCINEX Take 1 tablet (600 mg total) by mouth 2 (two) times daily for 10 days.   Klor-Con M20 20 MEQ tablet Generic drug: potassium chloride SA TAKE 1 TABLET BY MOUTH EVERY DAY What changed: how much to take   lisinopril 5 MG tablet Commonly known as: ZESTRIL Take 1 tablet (5 mg total) by mouth daily.   metoprolol succinate 50 MG 24 hr tablet Commonly known as: TOPROL-XL TAKE 1 TABLET BY MOUTH DAILY. TAKE WITH OR IMMEDIATELY FOLLOWING A MEAL.   multivitamin with minerals Tabs tablet Take 1 tablet by mouth daily.   omega-3 acid ethyl esters 1 g capsule Commonly known as: LOVAZA Take 2 g by mouth daily.   omeprazole 40 MG capsule Commonly known as: PRILOSEC Take 40 mg by mouth 2 (two)  times daily.   oxyCODONE-acetaminophen 10-325 MG tablet Commonly known as: PERCOCET Take 1 tablet by mouth every 4 (four) hours as needed for pain.   sildenafil 100 MG tablet Commonly known as: VIAGRA Take 100 mg by mouth daily as needed for erectile dysfunction.   tamsulosin 0.4 MG Caps capsule Commonly known as: FLOMAX TAKE 1 CAPSULE BY MOUTH EVERY DAY   testosterone cypionate 200 MG/ML injection Commonly known as: DEPOTESTOSTERONE CYPIONATE Inject 200 mg into the muscle every 28 (twenty-eight) days.        Allergies  Allergen Reactions   Novocain [Procaine] Other (See Comments)    Syncope   Procaine Hcl Other (See Comments)    Passes out    Lyrica [Pregabalin]    Neurontin [Gabapentin]    Amitriptyline Other (See Comments)    Sleepy   Dicyclomine Other (See Comments)    Other reaction(s): Abdominal Pain   Metformin Nausea Only   Nitroglycerin Other (See Comments)    Makes Blood pressure go to low.     Consultations: Cardiology-[facility]  Procedures/Studies: DG Chest 2 View  Result Date: 08/21/2021 CLINICAL DATA:  Cough and short of breath EXAM: CHEST - 2 VIEW COMPARISON:  07/08/2020 FINDINGS: Mild cardiomegaly. Trace pleural effusions. No focal opacity or pneumothorax. No overt pulmonary edema. IMPRESSION: Cardiomegaly with trace pleural effusions. Electronically Signed   By: Donavan Foil M.D.   On: 08/21/2021 22:37   CARDIAC CATHETERIZATION  Result Date: 08/26/2021   Dist LAD lesion is 80% stenosed.   Prox LAD-1 lesion is 10% stenosed.   Prox Cx lesion is 50% stenosed.   Dist RCA lesion is 100% stenosed.   Ost 1st Diag lesion is 90% stenosed.   Prox RCA lesion is 99% stenosed.   Ost 3rd Mrg to 3rd Mrg lesion is 50% stenosed.   Mid LAD lesion is 10% stenosed.   Ost LM lesion is 20% stenosed.   Non-stenotic Prox LAD-2 lesion was previously treated.   The left ventricular systolic function is normal.   LV end diastolic pressure is moderately elevated.   The left ventricular ejection fraction is 50-55% by visual estimate. 1.  Patent LAD stents with no significant restenosis.  Chronically occluded right coronary artery with left-to-right collaterals and moderate left circumflex disease.  No significant change in coronary anatomy since most recent cardiac catheterization. 2.  Low normal LV systolic function with an EF of 50 to 55%.  Moderately elevated left ventricular end-diastolic pressure at 22 mmHg. Recommendations: Elevated troponin is likely due to supply demand ischemia. Recommend continuing medical therapy. Eliquis can be resumed tonight. The patient is volume overloaded and I elected to resume furosemide 40 mg by mouth twice  daily.   ECHOCARDIOGRAM COMPLETE  Result Date: 08/24/2021    ECHOCARDIOGRAM REPORT   Patient Name:   MARKS SCALERA Date of Exam: 08/24/2021 Medical Rec #:  [id number]      Height:       73.0 in Accession #:    [id number]     Weight:       248.2 lb Date of Birth:  1941-04-01       BSA:          2.358 m Patient Age:    72 years       BP:           122/63 mmHg Patient Gender: M              HR:  102 bpm. Exam Location:  [facility] Procedure: 2D Echo, Cardiac Doppler and Color Doppler Indications:     NSTEMI I21.4  History:         Patient has prior history of Echocardiogram examinations, most                  recent 07/08/2020. CAD and Previous Myocardial Infarction; Risk                  Factors:Diabetes and Hypertension.  Sonographer:     Sherrie Sport Referring Phys:  [id number] [place] Diagnosing Phys: Kathlyn Sacramento MD  Sonographer Comments: Suboptimal apical window. IMPRESSIONS  1. Left ventricular ejection fraction, by estimation, is 50 to 55%. The left ventricle has low normal function. The left ventricle has no regional wall motion abnormalities. The left ventricular internal cavity size was mildly dilated. There is mild left ventricular hypertrophy. Left ventricular diastolic parameters are indeterminate.  2. Right ventricular systolic function is normal. The right ventricular size is normal. There is moderately elevated pulmonary artery systolic pressure. The estimated right ventricular systolic pressure is 74.0 mmHg.  3. Left atrial size was moderately dilated.  4. Right atrial size was moderately dilated.  5. The mitral valve is abnormal. Moderate mitral valve regurgitation. No evidence of mitral stenosis.  6. The aortic valve is calcified. Aortic valve regurgitation is not visualized. Aortic valve sclerosis/calcification is present, without any evidence of aortic stenosis. FINDINGS  Left Ventricle: Left ventricular ejection fraction, by estimation, is 50 to 55%. The left ventricle has low normal  function. The left ventricle has no regional wall motion abnormalities. The left ventricular internal cavity size was mildly dilated. There is mild left ventricular hypertrophy. Left ventricular diastolic parameters are indeterminate. Right Ventricle: The right ventricular size is normal. No increase in right ventricular wall thickness. Right ventricular systolic function is normal. There is moderately elevated pulmonary artery systolic pressure. The tricuspid regurgitant velocity is 3.30 m/s, and with an assumed right atrial pressure of 10 mmHg, the estimated right ventricular systolic pressure is 81.4 mmHg. Left Atrium: Left atrial size was moderately dilated. Right Atrium: Right atrial size was moderately dilated. Pericardium: Trivial pericardial effusion is present. Mitral Valve: The mitral valve is abnormal. There is mild thickening of the mitral valve leaflet(s). Moderate mitral valve regurgitation. No evidence of mitral valve stenosis. MV peak gradient, 9.5 mmHg. The mean mitral valve gradient is 3.0 mmHg. Tricuspid Valve: The tricuspid valve is normal in structure. Tricuspid valve regurgitation is mild . No evidence of tricuspid stenosis. Aortic Valve: The aortic valve is calcified. Aortic valve regurgitation is not visualized. Aortic valve sclerosis/calcification is present, without any evidence of aortic stenosis. Aortic valve mean gradient measures 2.0 mmHg. Aortic valve peak gradient measures 3.9 mmHg. Aortic valve area, by VTI measures 4.08 cm. Pulmonic Valve: The pulmonic valve was normal in structure. Pulmonic valve regurgitation is trivial. No evidence of pulmonic stenosis. Aorta: The aortic root is normal in size and structure. Venous: The inferior vena cava was not well visualized. IAS/Shunts: No atrial level shunt detected by color flow Doppler.  LEFT VENTRICLE PLAX 2D LVIDd:         5.50 cm LVIDs:         4.00 cm LV PW:         1.10 cm LV IVS:        1.15 cm LVOT diam:     2.00 cm LV SV:          64 LV  SV Index:   27 LVOT Area:     3.14 cm  RIGHT VENTRICLE RV Basal diam:  4.00 cm RV S prime:     12.20 cm/s TAPSE (M-mode): 3.5 cm LEFT ATRIUM              Index        RIGHT ATRIUM           Index LA diam:        4.40 cm  1.87 cm/m   RA Area:     30.00 cm LA Vol (A2C):   145.0 ml 61.48 ml/m  RA Volume:   110.00 ml 46.64 ml/m LA Vol (A4C):   101.0 ml 42.83 ml/m LA Biplane Vol: 127.0 ml 53.85 ml/m  AORTIC VALVE                    PULMONIC VALVE AV Area (Vmax):    3.28 cm     PV Vmax:        0.67 m/s AV Area (Vmean):   3.41 cm     PV Vmean:       47.700 cm/s AV Area (VTI):     4.08 cm     PV VTI:         0.124 m AV Vmax:           98.80 cm/s   PV Peak grad:   1.8 mmHg AV Vmean:          63.050 cm/s  PV Mean grad:   1.0 mmHg AV VTI:            0.156 m      RVOT Peak grad: 2 mmHg AV Peak Grad:      3.9 mmHg AV Mean Grad:      2.0 mmHg LVOT Vmax:         103.00 cm/s LVOT Vmean:        68.500 cm/s LVOT VTI:          0.203 m LVOT/AV VTI ratio: 1.30  AORTA Ao Root diam: 3.53 cm MITRAL VALVE                TRICUSPID VALVE MV Area (PHT): 3.72 cm     TR Peak grad:   43.6 mmHg MV Area VTI:   1.94 cm     TR Vmax:        330.00 cm/s MV Peak grad:  9.5 mmHg MV Mean grad:  3.0 mmHg     SHUNTS MV Vmax:       1.54 m/s     Systemic VTI:  0.20 m MV Vmean:      75.8 cm/s    Systemic Diam: 2.00 cm MV Decel Time: 204 msec     Pulmonic VTI:  0.124 m MV E velocity: 112.00 cm/s Kathlyn Sacramento MD Electronically signed by Kathlyn Sacramento MD Signature Date/Time: 08/24/2021/9:21:41 AM    Final       Subjective: Seen and examined on the day of discharge.  Stable no distress.  Chest pain resolved.  Some mild persistent cough but on room air.  Ambulating without difficulty.  Stable for discharge home  Discharge Exam: Vitals:   08/29/21 0625 08/29/21 0846  BP: (!) 146/72 117/81  Pulse: 95 (!) 103  Resp: 16 18  Temp: 98.2 F (36.8 C) 97.9 F (36.6 C)  SpO2: 100% 97%   Vitals:   08/28/21 2322 08/29/21 0500 08/29/21  0625 08/29/21 0846  BP: 129/64  Marland Kitchen)  146/72 117/81  Pulse: 76  95 (!) 103  Resp: 16  16 18   Temp: 98.2 F (36.8 C)  98.2 F (36.8 C) 97.9 F (36.6 C)  TempSrc: Oral  Oral   SpO2: 98%  100% 97%  Weight:  117.3 kg    Height:        General: Pt is alert, awake, not in acute distress Cardiovascular: RRR, S1/S2 +, no rubs, no gallops Respiratory: Bibasilar crackles.  Normal work of breathing.  Room air Abdominal: Soft, NT, ND, bowel sounds + Extremities: no edema, no cyanosis    The results of significant diagnostics from this hospitalization (including imaging, microbiology, ancillary and laboratory) are listed below for reference.     Microbiology: Recent Results (from the past 240 hour(s))  Resp Panel by RT-PCR (Flu A&B, Covid) Nasopharyngeal Swab     Status: Abnormal   Collection Time: 08/21/21 10:12 PM   Specimen: Nasopharyngeal Swab; Nasopharyngeal(NP) swabs in vial transport medium  Result Value Ref Range Status   SARS Coronavirus 2 by RT PCR POSITIVE (A) NEGATIVE Final    Comment: (NOTE) SARS-CoV-2 target nucleic acids are DETECTED.  The SARS-CoV-2 RNA is generally detectable in upper respiratory specimens during the acute phase of infection. Positive results are indicative of the presence of the identified virus, but do not rule out bacterial infection or co-infection with other pathogens not detected by the test. Clinical correlation with patient history and other diagnostic information is necessary to determine patient infection status. The expected result is Negative.  Fact Sheet for Patients: EntrepreneurPulse.com.au  Fact Sheet for Healthcare Providers: IncredibleEmployment.be  This test is not yet approved or cleared by the [place] FDA and  has been authorized for detection and/or diagnosis of SARS-CoV-2 by FDA under an Emergency Use Authorization (EUA).  This EUA will remain in effect (meaning this test can be  used) for the duration of  the COVID-19 declaration under Section 564(b)(1) of the A ct, 21 U.S.C. section 360bbb-3(b)(1), unless the authorization is terminated or revoked sooner.     Influenza A by PCR NEGATIVE NEGATIVE Final   Influenza B by PCR NEGATIVE NEGATIVE Final    Comment: (NOTE) The Xpert Xpress SARS-CoV-2/FLU/RSV plus assay is intended as an aid in the diagnosis of influenza from Nasopharyngeal swab specimens and should not be used as a sole basis for treatment. Nasal washings and aspirates are unacceptable for Xpert Xpress SARS-CoV-2/FLU/RSV testing.  Fact Sheet for Patients: EntrepreneurPulse.com.au  Fact Sheet for Healthcare Providers: IncredibleEmployment.be  This test is not yet approved or cleared by the [place] FDA and has been authorized for detection and/or diagnosis of SARS-CoV-2 by FDA under an Emergency Use Authorization (EUA). This EUA will remain in effect (meaning this test can be used) for the duration of the COVID-19 declaration under Section 564(b)(1) of the Act, 21 U.S.C. section 360bbb-3(b)(1), unless the authorization is terminated or revoked.  Performed at [facility], ., [place], [place] [address]      Labs: BNP (last 3 results) Recent Labs    08/22/21 0500  BNP 924.2*   Basic Metabolic Panel: Recent Labs  Lab 08/23/21 0555 08/24/21 0507 08/25/21 0623 08/26/21 0739 08/27/21 0555 08/29/21 0556  NA 138 138 138 137 138 139  K 3.2* 3.3* 3.8 3.7 3.9 3.6  CL 107 107 107 105 106 104  CO2 25 24 24 26 27 29   GLUCOSE 97 108* 111* 120* 111* 109*  BUN 10 12 12 11 10 11   CREATININE  0.92 0.89 0.91 0.83 0.85 0.90  CALCIUM 8.1* 8.0* 8.2* 8.2* 8.4* 8.4*  MG 1.5* 1.8 1.9 1.7 1.7  --    Liver Function Tests: Recent Labs  Lab 08/23/21 0555 08/24/21 0507 08/25/21 0623 08/26/21 0739 08/27/21 0555  AST 15 12* 13* 12* 10*  ALT 9 9 9 9 11   ALKPHOS 56 66 65 66 65  BILITOT  2.0* 1.7* 1.7* 1.6* 1.6*  PROT 6.1* 6.1* 6.1* 6.2* 6.4*  ALBUMIN 3.1* 2.9* 3.0* 3.2* 3.2*   No results for input(s): LIPASE, AMYLASE in the last 168 hours. No results for input(s): AMMONIA in the last 168 hours. CBC: Recent Labs  Lab 08/23/21 0555 08/24/21 0507 08/25/21 0623 08/26/21 0739 08/27/21 0555  WBC 7.4 7.4 7.9 8.2 9.8  NEUTROABS 3.3 3.1 4.9 4.5 5.0  HGB 8.6* 8.3* 8.3* 8.4* 8.5*  HCT 26.0* 25.6* 26.2* 26.2* 26.3*  MCV 89.7 91.1 91.3 89.1 92.0  PLT 84* 82* 78* 88* 88*   Cardiac Enzymes: No results for input(s): CKTOTAL, CKMB, CKMBINDEX, TROPONINI in the last 168 hours. BNP: Invalid input(s): POCBNP CBG: Recent Labs  Lab 08/27/21 2122 08/28/21 1339 08/28/21 1759 08/28/21 2302 08/29/21 0846  GLUCAP 150* 196* 166* 202* 121*   D-Dimer No results for input(s): DDIMER in the last 72 hours. Hgb A1c No results for input(s): HGBA1C in the last 72 hours. Lipid Profile No results for input(s): CHOL, HDL, LDLCALC, TRIG, CHOLHDL, LDLDIRECT in the last 72 hours. Thyroid function studies No results for input(s): TSH, T4TOTAL, T3FREE, THYROIDAB in the last 72 hours.  Invalid input(s): FREET3 Anemia work up No results for input(s): VITAMINB12, FOLATE, FERRITIN, TIBC, IRON, RETICCTPCT in the last 72 hours. Urinalysis    Component Value Date/Time   COLORURINE AMBER (A) 08/22/2021 1038   APPEARANCEUR HAZY (A) 08/22/2021 1038   APPEARANCEUR Clear 01/01/2016 1354   LABSPEC 1.024 08/22/2021 1038   LABSPEC 1.023 04/25/2014 0238   PHURINE 5.0 08/22/2021 1038   GLUCOSEU 50 (A) 08/22/2021 1038   GLUCOSEU 50 mg/dL 04/25/2014 0238   HGBUR NEGATIVE 08/22/2021 1038   [place] 08/22/2021 1038   BILIRUBINUR Negative 01/01/2016 1354   BILIRUBINUR Negative 04/25/2014 0238   KETONESUR NEGATIVE 08/22/2021 1038   PROTEINUR 100 (A) 08/22/2021 1038   NITRITE NEGATIVE 08/22/2021 1038   LEUKOCYTESUR NEGATIVE 08/22/2021 1038   LEUKOCYTESUR Negative 04/25/2014 0238   Sepsis  Labs Invalid input(s): PROCALCITONIN,  WBC,  LACTICIDVEN Microbiology Recent Results (from the past 240 hour(s))  Resp Panel by RT-PCR (Flu A&B, Covid) Nasopharyngeal Swab     Status: Abnormal   Collection Time: 08/21/21 10:12 PM   Specimen: Nasopharyngeal Swab; Nasopharyngeal(NP) swabs in vial transport medium  Result Value Ref Range Status   SARS Coronavirus 2 by RT PCR POSITIVE (A) NEGATIVE Final    Comment: (NOTE) SARS-CoV-2 target nucleic acids are DETECTED.  The SARS-CoV-2 RNA is generally detectable in upper respiratory specimens during the acute phase of infection. Positive results are indicative of the presence of the identified virus, but do not rule out bacterial infection or co-infection with other pathogens not detected by the test. Clinical correlation with patient history and other diagnostic information is necessary to determine patient infection status. The expected result is Negative.  Fact Sheet for Patients: EntrepreneurPulse.com.au  Fact Sheet for Healthcare Providers: IncredibleEmployment.be  This test is not yet approved or cleared by the [place] FDA and  has been authorized for detection and/or diagnosis of SARS-CoV-2 by FDA under an Emergency Use Authorization (EUA).  This  EUA will remain in effect (meaning this test can be used) for the duration of  the COVID-19 declaration under Section 564(b)(1) of the A ct, 21 U.S.C. section 360bbb-3(b)(1), unless the authorization is terminated or revoked sooner.     Influenza A by PCR NEGATIVE NEGATIVE Final   Influenza B by PCR NEGATIVE NEGATIVE Final    Comment: (NOTE) The Xpert Xpress SARS-CoV-2/FLU/RSV plus assay is intended as an aid in the diagnosis of influenza from Nasopharyngeal swab specimens and should not be used as a sole basis for treatment. Nasal washings and aspirates are unacceptable for Xpert Xpress SARS-CoV-2/FLU/RSV testing.  Fact Sheet for  Patients: EntrepreneurPulse.com.au  Fact Sheet for Healthcare Providers: IncredibleEmployment.be  This test is not yet approved or cleared by the [place] FDA and has been authorized for detection and/or diagnosis of SARS-CoV-2 by FDA under an Emergency Use Authorization (EUA). This EUA will remain in effect (meaning this test can be used) for the duration of the COVID-19 declaration under Section 564(b)(1) of the Act, 21 U.S.C. section 360bbb-3(b)(1), unless the authorization is terminated or revoked.  Performed at [facility], [address] [place], [place] [address]      Time coordinating discharge: Over 30 minutes  SIGNED:   Sidney Ace, MD  [facility] 08/29/2021, 10:34 AM Pager   If 7PM-7AM, please contact night-coverage

## 2021-08-29 NOTE — Progress Notes (Signed)
Progress Note  Patient Name: Cody Price Date of Encounter: 08/29/2021  [facility] Cardiologist: Ida Rogue, MD   Subjective   Felt very cold in his room overnight, didn't sleep well. Otherwise made a lot of urine. Still coughing this AM but breathing stable. Hoping to go home.   Inpatient Medications    Scheduled Meds:  apixaban  5 mg Oral BID   vitamin C  500 mg Oral Daily   aspirin EC  81 mg Oral Daily   atorvastatin  40 mg Oral Daily   cholecalciferol  1,000 Units Oral Daily   cyanocobalamin  1,000 mcg Intramuscular Q30 days   finasteride  5 mg Oral Daily   fluticasone  2 spray Each Nare Daily   furosemide  40 mg Intravenous BID   guaiFENesin  600 mg Oral BID   insulin aspart  0-15 Units Subcutaneous TID AC & HS   lisinopril  5 mg Oral Daily   loratadine  10 mg Oral Daily   metoprolol succinate  50 mg Oral Q breakfast   multivitamin with minerals  1 tablet Oral Daily   omega-3 acid ethyl esters  2 g Oral Daily   pantoprazole  80 mg Oral BID   potassium chloride SA  10 mEq Oral Daily   sodium chloride flush  3 mL Intravenous Q12H   sodium chloride flush  3 mL Intravenous Q12H   sucralfate  1 g Oral TID WC & HS   tamsulosin  0.4 mg Oral Daily   zinc sulfate  220 mg Oral Daily   Continuous Infusions:  sodium chloride     sodium chloride 10 mL/hr at 08/26/21 4166   sodium chloride     PRN Meds: sodium chloride, sodium chloride, oxyCODONE **AND** acetaminophen, acetaminophen, chlorpheniramine-HYDROcodone, diazepam, guaiFENesin-dextromethorphan, magnesium hydroxide, ondansetron **OR** ondansetron (ZOFRAN) IV, sodium chloride flush, sodium chloride flush, traZODone   Vital Signs    Vitals:   08/28/21 2322 08/29/21 0500 08/29/21 0625 08/29/21 0846  BP: 129/64  (!) 146/72 117/81  Pulse: 76  95 (!) 103  Resp: 16  16 18   Temp: 98.2 F (36.8 C)  98.2 F (36.8 C) 97.9 F (36.6 C)  TempSrc: Oral  Oral   SpO2: 98%  100% 97%  Weight:  117.3 kg    Height:         Intake/Output Summary (Last 24 hours) at 08/29/2021 0920 Last data filed at 08/29/2021 0630 Gross per 24 hour  Intake 3 ml  Output 1725 ml  Net -1722 ml   Last 3 Weights 08/29/2021 08/28/2021 08/22/2021  Weight (lbs) 258 lb 11.2 oz 260 lb 6.4 oz 248 lb 3.2 oz  Weight (kg) 117.346 kg 118.117 kg 112.583 kg      Telemetry    Atrial fib - Personally Reviewed  ECG    No new since 12/16 - Personally Reviewed  Physical Exam   GEN: Well nourished, well developed in no acute distress HEENT: Normal, moist mucous membranes NECK: No JVD CARDIAC: irregularly irregular rhythm, normal S1 and S2, no rubs or gallops. No murmur. VASCULAR: Radial and DP pulses 2+ bilaterally. No carotid bruits RESPIRATORY:  coarse breath sounds diffusely ABDOMEN: Soft, non-tender, non-distended MUSCULOSKELETAL:  Ambulates independently SKIN: Warm and dry, no edema NEUROLOGIC:  Alert and oriented x 3. No focal neuro deficits noted. PSYCHIATRIC:  Normal affect    Labs    High Sensitivity Troponin:   Recent Labs  Lab 08/22/21 0100 08/22/21 0500 08/22/21 1630 08/22/21 2202 08/23/21 0555  TROPONINIHS 1,252* [id number]* 1,244* 1,082* 877*     Chemistry Recent Labs  Lab 08/25/21 0623 08/26/21 0739 08/27/21 0555 08/29/21 0556  NA 138 137 138 139  K 3.8 3.7 3.9 3.6  CL 107 105 106 104  CO2 24 26 27 29   GLUCOSE 111* 120* 111* 109*  BUN 12 11 10 11   CREATININE 0.91 0.83 0.85 0.90  CALCIUM 8.2* 8.2* 8.4* 8.4*  MG 1.9 1.7 1.7  --   PROT 6.1* 6.2* 6.4*  --   ALBUMIN 3.0* 3.2* 3.2*  --   AST 13* 12* 10*  --   ALT 9 9 11   --   ALKPHOS 65 66 65  --   BILITOT 1.7* 1.6* 1.6*  --   GFRNONAA >60 >60 >60 >60  ANIONGAP 7 6 5 6     Lipids  Recent Labs  Lab 08/23/21 0555  CHOL 55  TRIG 56  HDL 19*  LDLCALC 25  CHOLHDL 2.9    Hematology Recent Labs  Lab 08/25/21 0623 08/26/21 0739 08/27/21 0555  WBC 7.9 8.2 9.8  RBC 2.87* 2.94* 2.86*  HGB 8.3* 8.4* 8.5*  HCT 26.2* 26.2* 26.3*  MCV  91.3 89.1 92.0  MCH 28.9 28.6 29.7  MCHC 31.7 32.1 32.3  RDW 16.1* 16.6* 16.5*  PLT 78* 88* 88*   Thyroid No results for input(s): TSH, FREET4 in the last 168 hours.  BNPNo results for input(s): BNP, PROBNP in the last 168 hours.  DDimer  Recent Labs  Lab 08/24/21 0507 08/25/21 0623 08/26/21 0739  DDIMER 0.34 0.50 0.50     Radiology    No results found.  Cardiac Studies   Cath 08/26/21 Dist LAD lesion is 80% stenosed.   Prox LAD-1 lesion is 10% stenosed.   Prox Cx lesion is 50% stenosed.   Dist RCA lesion is 100% stenosed.   Ost 1st Diag lesion is 90% stenosed.   Prox RCA lesion is 99% stenosed.   Ost 3rd Mrg to 3rd Mrg lesion is 50% stenosed.   Mid LAD lesion is 10% stenosed.   Ost LM lesion is 20% stenosed.   Non-stenotic Prox LAD-2 lesion was previously treated.   The left ventricular systolic function is normal.   LV end diastolic pressure is moderately elevated.   The left ventricular ejection fraction is 50-55% by visual estimate.   1.  Patent LAD stents with no significant restenosis.  Chronically occluded right coronary artery with left-to-right collaterals and moderate left circumflex disease.  No significant change in coronary anatomy since most recent cardiac catheterization. 2.  Low normal LV systolic function with an EF of 50 to 55%.  Moderately elevated left ventricular end-diastolic pressure at 22 mmHg.   Recommendations: Elevated troponin is likely due to supply demand ischemia. Recommend continuing medical therapy. Eliquis can be resumed tonight. The patient is volume overloaded and I elected to resume furosemide 40 mg by mouth twice daily.  Echo 08/24/21 1. Left ventricular ejection fraction, by estimation, is 50 to 55%. The  left ventricle has low normal function. The left ventricle has no regional  wall motion abnormalities. The left ventricular internal cavity size was  mildly dilated. There is mild  left ventricular hypertrophy. Left  ventricular diastolic parameters are  indeterminate.   2. Right ventricular systolic function is normal. The right ventricular  size is normal. There is moderately elevated pulmonary artery systolic  pressure. The estimated right ventricular systolic pressure is 19.4 mmHg.   3. Left atrial size was moderately dilated.  4. Right atrial size was moderately dilated.   5. The mitral valve is abnormal. Moderate mitral valve regurgitation. No  evidence of mitral stenosis.   6. The aortic valve is calcified. Aortic valve regurgitation is not  visualized. Aortic valve sclerosis/calcification is present, without any  evidence of aortic stenosis.   Patient Profile     80 y.o. male with PMH CAD s/p PCI, atrial fibrillation on apixaban, type II diabetes, hyperlipidemia, hypertension, history of PE admitted for Covid infection with respiratory failure 12/17. Cardiology following for elevated troponins, cath stable, consistent with demand ischemia  Assessment & Plan    Known CAD Elevated troponins Hyperlipidemia -cath from 08/26/21 unchanged from prior, patent stents -suggests demand ischemia in the setting of Covid infection -continue aspirin, statin, beta blocker -echo as above  Covid infection, with respiratory distress Acute on chronic diastolic heart failure -LVEDP elevated to 22 on cath, changed to IV lasix post cath -transition to furosemide 40 mg PO BID today, appears euvolemic. Given instructions on when to call the office -heart failure education done -Cr 0.90, K 3.6 -suspect initial admission weight (112.6 kg), I/O not accurate. Current weight 117.2 kg, net negative 2.2L by chart, but 1.7 L of that was yesterday.  Permanent atrial fibrillation -continue beta blocker, apixaban  [facility] will sign off.   Medication Recommendations:  as above Other recommendations (labs, testing, etc):  none Follow up as an outpatient:  we will arrange for outpatient follow up   For  questions or updates, please contact [place] Please consult [email or web address] for contact info under        Signed, Buford Dresser, MD  08/29/2021, 9:20 AM

## 2021-09-27 NOTE — Progress Notes (Signed)
Cardiology Office Note  Date:  09/28/2021   ID:  Cody Price, DOB [birth year], MRN [id number]  PCP:  Baxter Hire, MD   Chief Complaint  Patient presents with   [facility] follow up     Patient c/o weakness, head & neck pressure and staggering while walking into office visit. Medications reviewed by the patient verbally.      HPI:  Cody Price is a 81 year old gentleman with a history of  coronary artery disease,  PTCA in 1991 of his RCA that later occluded 100%,  chest pain in September 2010   stent placed to his LAD atrial fibrillation following back surgery  spontaneously converted , previously declined anticoagulation Diabetes, lower extremity neuropathy. s/p back surgery for lumbar spinal stenosis and DJD  unable to tolerate aspirin, GI problems 20 years ago,  stopped the metoprolol on his own as he reported having fatigue.   seen by Dr. Grayland Ormond for low platelets, treated with prednisone Quit smoking in 1987, smoked 20 years Chronically low platelets, avg 60s to 70s presenting for routine follow-up of his coronary artery disease.  Last seen in clinic by myself August 2022  Presented to the hospital December 2022, chest pain, COVID infection, non-STEMI peak troponin close to 1900 Underwent cardiac catheterization August 26, 2021    The left ventricular ejection fraction is 50-55% by visual estimate. 1.  Patent LAD stents with no significant restenosis.  Chronically occluded right coronary artery with left-to-right collaterals and moderate left circumflex disease.  No significant change in coronary anatomy since most recent cardiac catheterization. 2.  Low normal LV systolic function with an EF of 50 to 55%.  Moderately elevated left ventricular end-diastolic pressure at 22 mmHg.   It was felt that elevated troponin is likely due to supply demand ischemia.  Discharged on Lasix 40 twice daily Metolazone held  Hemoglobin 8.5 August 27, 2021  Having neck pain into back of  head pain Taking pain meds Missed pain clinic appt  Feels weak, arthritis everywhere (shoulder, back, neck) Chronic back pain, neuropathy in his feet Takes 1.5 percocet    on eliquis Not on asa/plavix  He buys groceries, uses a [employer] copy Wife cooks   Lab work reviewed HBA1C 6.5 Total chol 80, LDL 30  EKG personally reviewed by myself on todays visit Atrial fibrillation with rate 78 bpm consider old anterior MI, PVC  Other past medical history reviewed  cardiac catheterization for unstable angina symptoms,  worsening shortness of breath, ejection fraction 45 to 50% Date of procedure July 11, 2020 drug-eluting stent was successfully placed using a Prince [id number].  Cardiac catheterization September 2000 and details a 75% proximal LAD lesion, 100% RCA lesion, 30% left circumflex lesion a xience 2.75 x 15 mm DES stent was placed.  hospitalization  non-STEMI 06/07/2018 Cardiac catheterization, stent to the LAD   cardiac catheterization 08/26/2014.  occluded proximal RCA which was chronic, mild to moderate proximal left circumflex, moderate distal left circumflex disease of a small vessel, moderate proximal OM disease moderate size vessel, LAD proximal stent patent, normal ejection fraction. Medical management was recommended.    PMH:   has a past medical history of Arthritis, BPH with obstruction/lower urinary tract symptoms, CAD (coronary artery disease) (2010), Diabetes mellitus, ED (erectile dysfunction), Elevated PSA, GERD (gastroesophageal reflux disease), Gross hematuria, H/O CT scan, Hematuria, History of hiatal hernia, History of kidney stones, HLD (hyperlipidemia), Hypertension, Ischemic heart disease (1991), Kidney stone, Morbid obesity ([place]), Myocardial infarction ([place]), Peripheral neuropathy, Pernicious anemia,  Pneumonia (1995), Pulmonary embolism ([place]), Renal cyst, Sleep apnea, and Spinal stenosis.  PSH:    Past Surgical History:  Procedure Laterality Date    APPENDECTOMY     BACK SURGERY     x3 back surgeries - /w fusioin, 1- [facility] 2- [facility]   bilateral inguinal hernia repair     cad     stent 2010   CARDIAC CATHETERIZATION  08/17/2014   CARPAL TUNNEL RELEASE  2014   bilateral    CHOLECYSTECTOMY     COLONOSCOPY WITH PROPOFOL N/A 07/11/2019   Procedure: COLONOSCOPY WITH PROPOFOL;  Surgeon: Price, Cody Pike, MD;  Location: [facility] ENDOSCOPY;  Service: Gastroenterology;  Laterality: N/A;   CORONARY BALLOON ANGIOPLASTY N/A 07/11/2020   Procedure: CORONARY BALLOON ANGIOPLASTY;  Surgeon: Cody Cowman, MD;  Location: [place] [facility];  Service: Cardiovascular;  Laterality: N/A;   CORONARY STENT INTERVENTION N/A 06/07/2018   Procedure: CORONARY STENT INTERVENTION;  Surgeon: Cody Bush, MD;  Location: [place] [facility];  Service: Cardiovascular;  Laterality: N/A;   CORONARY STENT INTERVENTION N/A 07/11/2020   Procedure: CORONARY STENT INTERVENTION;  Surgeon: Cody Cowman, MD;  Location: [place] [facility];  Service: Cardiovascular;  Laterality: N/A;   ESOPHAGOGASTRODUODENOSCOPY (EGD) WITH PROPOFOL N/A 07/11/2019   Procedure: ESOPHAGOGASTRODUODENOSCOPY (EGD) WITH PROPOFOL;  Surgeon: Price, Cody Pike, MD;  Location: [facility] END[facility]COPY;  Service: Gastroenterology;  Laterality: N/A;   LAMINECTOMY     LEFT HEART CATH AND CORONARY ANGIOGRAPHY N/A 06/07/2018   Procedure: LEFT HEART CATH AND CORONARY ANGIOGRAPHY;  Surgeon: Cody Bush, MD;  Location: [place] [facility];  Service: Cardiovascular;  Laterality: N/A;   LEFT HEART CATH AND CORONARY ANGIOGRAPHY N/A 08/26/2021   Procedure: LEFT HEART CATH AND CORONARY ANGIOGRAPHY;  Surgeon: Cody Hampshire, MD;  Location: [place] [facility];  Service: Cardiovascular;  Laterality: N/A;   RIGHT/LEFT HEART CATH AND CORONARY ANGIOGRAPHY N/A 07/11/2020   Procedure: RIGHT/LEFT HEART CATH AND CORONARY ANGIOGRAPHY;  Surgeon: Cody Merritts, MD;  Location: [place] [facility];  Service:  Cardiovascular;  Laterality: N/A;    Current Outpatient Medications  Medication Sig Dispense Refill   acetaminophen (TYLENOL) 500 MG tablet Take 500 mg by mouth as needed.      apixaban (ELIQUIS) 5 MG TABS tablet Take 1 tablet (5 mg total) by mouth 2 (two) times daily. 60 tablet 11   aspirin EC 81 MG EC tablet Take 1 tablet (81 mg total) by mouth daily. Swallow whole. 30 tablet 11   atorvastatin (LIPITOR) 40 MG tablet TAKE 1 TABLET BY MOUTH DAILY AT 6 PM. 90 tablet 3   Coenzyme Q10 (CO Q-10) 200 MG CAPS Take 200 mg by mouth daily.     cyanocobalamin (,VITAMIN B-12,) 1000 MCG/ML injection Inject 1,000 mcg into the muscle every 30 (thirty) days.     diazepam (VALIUM) 5 MG tablet Take 5 mg by mouth 3 (three) times daily as needed for anxiety or muscle spasms.     finasteride (PROSCAR) 5 MG tablet TAKE 1 TABLET BY MOUTH EVERY DAY 90 tablet 3   fluticasone (FLONASE) 50 MCG/ACT nasal spray Place 2 sprays into both nostrils daily. 9.9 mL 3   furosemide (LASIX) 40 MG tablet Take 1 tablet (40 mg total) by mouth 2 (two) times daily. 60 tablet 0   glimepiride (AMARYL) 4 MG tablet Take 4 mg by mouth 2 (two) times daily.     lisinopril (ZESTRIL) 5 MG tablet Take 1 tablet (5 mg total) by mouth daily. [place]  tablet 3   metoprolol succinate (TOPROL-XL) 50 MG 24 hr tablet TAKE 1 TABLET BY MOUTH DAILY. TAKE WITH OR IMMEDIATELY FOLLOWING A MEAL. 90 tablet 2   Multiple Vitamin (MULTIVITAMIN WITH MINERALS) TABS tablet Take 1 tablet by mouth daily. 30 tablet 0   omega-3 acid ethyl esters (LOVAZA) 1 g capsule Take 2 g by mouth daily.     omeprazole (PRILOSEC) 40 MG capsule Take 40 mg by mouth 2 (two) times daily.     oxyCODONE-acetaminophen (PERCOCET) 10-325 MG tablet Take 1 tablet by mouth every 4 (four) hours as needed for pain.      potassium chloride (KLOR-CON) 10 MEQ tablet Take 10 mEq by mouth daily.     sildenafil (VIAGRA) 100 MG tablet Take 100 mg by mouth daily as needed for erectile dysfunction.      tamsulosin (FLOMAX) 0.4 MG CAPS capsule TAKE 1 CAPSULE BY MOUTH EVERY DAY 90 capsule 3   testosterone cypionate (DEPOTESTOSTERONE CYPIONATE) 200 MG/ML injection Inject 200 mg into the muscle every 28 (twenty-eight) days.     No current facility-administered medications for this visit.     Allergies:   Novocain [procaine], Procaine hcl, Lyrica [pregabalin], Neurontin [gabapentin], Amitriptyline, Dicyclomine, Metformin, and Nitroglycerin   Social History:  The patient  reports that he quit smoking about 35 years ago. His smoking use included cigarettes. He has a 45.00 pack-year smoking history. He has quit using smokeless tobacco. He reports that he does not currently use alcohol. He reports that he does not use drugs.   Family History:   family history includes Heart attack in his brother; Heart disease in his father; Heart failure in his mother; Skin cancer in his sister.    Review of Systems: Review of Systems  Constitutional: Negative.   HENT: Negative.    Respiratory: Negative.    Cardiovascular: Negative.   Gastrointestinal: Negative.   Musculoskeletal:  Positive for back pain, joint pain and neck pain.  Neurological: Negative.        Neuropathy lower extremities  Psychiatric/Behavioral: Negative.    All other systems reviewed and are negative.  PHYSICAL EXAM: VS:  BP (!) 130/50 (BP Location: Left Arm, Patient Position: Sitting, Cuff Size: Normal)    Pulse 78    Ht 6\' 2"  (1.88 m)    Wt 233 lb 6 oz (105.9 kg)    SpO2 98%    BMI 29.96 kg/m  , BMI Body mass index is 29.96 kg/m.  Constitutional:  oriented to person, place, and time. No distress.  HENT:  Head: Grossly normal Eyes:  no discharge. No scleral icterus.  Neck: No JVD, no carotid bruits  Cardiovascular: Regular rate and rhythm, no murmurs appreciated Pulmonary/Chest: Clear to auscultation bilaterally, no wheezes or rails Abdominal: Soft.  no distension.  no tenderness.  Musculoskeletal: Normal range of  motion Neurological:  normal muscle tone. Coordination normal. No atrophy Skin: Skin warm and dry Psychiatric: normal affect, pleasant   Recent Labs: 08/22/2021: B Natriuretic Peptide 574.8 08/27/2021: ALT 11; Hemoglobin 8.5; Magnesium 1.7; Platelets 88 08/29/2021: BUN 11; Creatinine, Ser 0.90; Potassium 3.6; Sodium 139    Lipid Panel Lab Results  Component Value Date   CHOL 55 08/23/2021   HDL 19 (L) 08/23/2021   LDLCALC 25 08/23/2021   TRIG 56 08/23/2021      Wt Readings from Last 3 Encounters:  09/28/21 233 lb 6 oz (105.9 kg)  08/29/21 258 lb 11.2 oz (117.3 kg)  04/21/21 249 lb 8 oz (113.2 kg)  ASSESSMENT AND PLAN:  Atrial fibrillation, unspecified type ([place]) - Plan: EKG 12-Lead Tolerating Eliquis 5 twice daily Rate relatively well controlled on metoprolol No changes to his medications  Weakness Getting over COVID infection, recent hospitalization, anemia Repeat CBC today with iron studies given trend has been down with his hemoglobin most recently in the 8 range  CAD with chronic stable angina Recent catheterization, patent stents, felt to be demand ischemia Off aspirin Plavix, prefers no antiplatelets, given his low platelets on Eliquis 5 twice daily alone  Mixed hyperlipidemia Cholesterol is at goal on the current lipid regimen. No changes to the medications were made.  Type 2 diabetes mellitus with other circulatory complication, without long-term current use of insulin (HCC) Diet restriction recommended Managed by primary care  Thrombocytopenia [facility]) previously seen by hematology at [facility]. Numbers are stable  Neuropathy  back pain, limited mobility For this complaint today, chronic back, worsening neck pain Taking higher doses of Percocet Seen by orthopedics last week, recommended he follow-up with orthopedics and primary care    Total encounter time more than 35 minutes  Greater than 50% was spent in counseling and coordination of care with  the patient   Orders Placed This Encounter  Procedures   Ferritin   Iron   Iron Binding Cap (TIBC)([facility])   CBC   EKG 12-Lead      Signed, Esmond Plants, M.D., Ph.D. 09/28/2021  [place], [place]

## 2021-09-28 ENCOUNTER — Other Ambulatory Visit: Payer: Self-pay

## 2021-09-28 ENCOUNTER — Ambulatory Visit (INDEPENDENT_AMBULATORY_CARE_PROVIDER_SITE_OTHER): Payer: Medicare HMO | Admitting: Cardiovascular Disease

## 2021-09-28 ENCOUNTER — Encounter: Payer: Self-pay | Admitting: Cardiovascular Disease

## 2021-09-28 VITALS — BP 130/50 | HR 78 | Ht 74.0 in | Wt 233.4 lb

## 2021-09-28 DIAGNOSIS — I10 Essential (primary) hypertension: Secondary | ICD-10-CM

## 2021-09-28 DIAGNOSIS — D649 Anemia, unspecified: Secondary | ICD-10-CM

## 2021-09-28 DIAGNOSIS — I4821 Permanent atrial fibrillation: Secondary | ICD-10-CM

## 2021-09-28 DIAGNOSIS — E782 Mixed hyperlipidemia: Secondary | ICD-10-CM | POA: Diagnosis not present

## 2021-09-28 DIAGNOSIS — I25118 Atherosclerotic heart disease of native coronary artery with other forms of angina pectoris: Secondary | ICD-10-CM

## 2021-09-28 DIAGNOSIS — R0609 Other forms of dyspnea: Secondary | ICD-10-CM

## 2021-09-28 DIAGNOSIS — R600 Localized edema: Secondary | ICD-10-CM

## 2021-09-28 DIAGNOSIS — E119 Type 2 diabetes mellitus without complications: Secondary | ICD-10-CM

## 2021-09-28 NOTE — Patient Instructions (Addendum)
Medication Instructions:  No changes  If you need a refill on your cardiac medications before your next appointment, please call your pharmacy.   Lab work: - Your physician recommends that you have lab work today:  CBC , iron studies (iron, ferritin, TIBC)  Testing/Procedures: No new testing needed  Follow-Up: At [facility], you and your health needs are our priority.  As part of our continuing mission to provide you with exceptional heart care, we have created designated Provider Care Teams.  These Care Teams include your primary Cardiologist (physician) and Advanced Practice Providers (APPs -  Physician Assistants and Nurse Practitioners) who all work together to provide you with the care you need, when you need it.  You will need a follow up appointment in 6 months  Providers on your designated Care Team:   Murray Hodgkins, NP Christell Faith, PA-C Cadence Kathlen Mody, [place]  COVID-19 Vaccine Information can be found at: [email or web address] For questions related to vaccine distribution or appointments, please email [email or web address] or call [phone].

## 2021-09-29 ENCOUNTER — Emergency Department
Admission: EM | Admit: 2021-09-29 | Discharge: 2021-09-29 | Disposition: A | Discharge status: Home / Self Care | Payer: Medicare HMO | Attending: Emergency Medicine | Admitting: Emergency Medicine

## 2021-09-29 ENCOUNTER — Emergency Department: Payer: Medicare HMO

## 2021-09-29 ENCOUNTER — Other Ambulatory Visit: Payer: Self-pay

## 2021-09-29 DIAGNOSIS — G44219 Episodic tension-type headache, not intractable: Secondary | ICD-10-CM | POA: Diagnosis not present

## 2021-09-29 DIAGNOSIS — I2510 Atherosclerotic heart disease of native coronary artery without angina pectoris: Secondary | ICD-10-CM | POA: Insufficient documentation

## 2021-09-29 DIAGNOSIS — M542 Cervicalgia: Secondary | ICD-10-CM | POA: Insufficient documentation

## 2021-09-29 DIAGNOSIS — E119 Type 2 diabetes mellitus without complications: Secondary | ICD-10-CM | POA: Diagnosis not present

## 2021-09-29 DIAGNOSIS — G8929 Other chronic pain: Secondary | ICD-10-CM | POA: Insufficient documentation

## 2021-09-29 LAB — CBC
HCT: 35.8 % — ABNORMAL LOW (ref 39.0–52.0)
Hematocrit: 36.2 % — ABNORMAL LOW (ref 37.5–51.0)
Hemoglobin: 12.5 g/dL — ABNORMAL LOW (ref 13.0–17.7)
Hemoglobin: 11.9 g/dL — ABNORMAL LOW (ref 13.0–17.0)
MCH: 29.5 pg (ref 26.6–33.0)
MCH: 29.6 pg (ref 26.0–34.0)
MCHC: 34.5 g/dL (ref 31.5–35.7)
MCHC: 33.2 g/dL (ref 30.0–36.0)
MCV: 85 fL (ref 79–97)
MCV: 89.1 fL (ref 80.0–100.0)
Platelets: 85 10*3/uL — CL (ref 150–450)
Platelets: 73 10*3/uL — ABNORMAL LOW (ref 150–400)
RBC: 4.24 x10E6/uL (ref 4.14–5.80)
RBC: 4.02 MIL/uL — ABNORMAL LOW (ref 4.22–5.81)
RDW: 15 % (ref 11.6–15.4)
RDW: 16 % — ABNORMAL HIGH (ref 11.5–15.5)
WBC: 6 10*3/uL (ref 3.4–10.8)
WBC: 6.9 10*3/uL (ref 4.0–10.5)
nRBC: 0 % (ref 0.0–0.2)

## 2021-09-29 LAB — IRON AND TIBC
Iron Saturation: 20 % (ref 15–55)
Iron: 55 ug/dL (ref 38–169)
Total Iron Binding Capacity: 279 ug/dL (ref 250–450)
UIBC: 224 ug/dL (ref 111–343)

## 2021-09-29 LAB — URINALYSIS, ROUTINE W REFLEX MICROSCOPIC
Bacteria, UA: NONE SEEN
Bilirubin Urine: NEGATIVE
Glucose, UA: NEGATIVE mg/dL
Hgb urine dipstick: NEGATIVE
Ketones, ur: NEGATIVE mg/dL
Leukocytes,Ua: NEGATIVE
Nitrite: NEGATIVE
Protein, ur: NEGATIVE mg/dL
Specific Gravity, Urine: 1.015 (ref 1.005–1.030)
pH: 5 (ref 5.0–8.0)

## 2021-09-29 LAB — PROTIME-INR
INR: 1.4 — ABNORMAL HIGH (ref 0.8–1.2)
Prothrombin Time: 17.2 seconds — ABNORMAL HIGH (ref 11.4–15.2)

## 2021-09-29 LAB — HEPATIC FUNCTION PANEL
ALT: 10 U/L (ref 0–44)
AST: 11 U/L — ABNORMAL LOW (ref 15–41)
Albumin: 4.1 g/dL (ref 3.5–5.0)
Alkaline Phosphatase: 70 U/L (ref 38–126)
Bilirubin, Direct: 0.3 mg/dL — ABNORMAL HIGH (ref 0.0–0.2)
Indirect Bilirubin: 0.8 mg/dL (ref 0.3–0.9)
Total Bilirubin: 1.1 mg/dL (ref 0.3–1.2)
Total Protein: 6.9 g/dL (ref 6.5–8.1)

## 2021-09-29 LAB — BASIC METABOLIC PANEL
Anion gap: 8 (ref 5–15)
BUN: 21 mg/dL (ref 8–23)
CO2: 27 mmol/L (ref 22–32)
Calcium: 9.4 mg/dL (ref 8.9–10.3)
Chloride: 98 mmol/L (ref 98–111)
Creatinine, Ser: 0.99 mg/dL (ref 0.61–1.24)
GFR, Estimated: >60 mL/min (ref >60)
Glucose, Bld: 222 mg/dL — ABNORMAL HIGH (ref 70–99)
Potassium: 4.7 mmol/L (ref 3.5–5.1)
Sodium: 133 mmol/L — ABNORMAL LOW (ref 135–145)

## 2021-09-29 LAB — CK: Total CK: 26 U/L — ABNORMAL LOW (ref 49–397)

## 2021-09-29 LAB — TROPONIN I (HIGH SENSITIVITY)
Troponin I (High Sensitivity): 6 ng/L (ref <18)
Troponin I (High Sensitivity): 6 ng/L (ref <18)

## 2021-09-29 LAB — FERRITIN: Ferritin: 144 ng/mL (ref 30–400)

## 2021-09-29 MED ORDER — METHOCARBAMOL 500 MG PO TABS: 500.0000 mg | ORAL_TABLET | Freq: Three times a day (TID) | ORAL | 0 refills | Status: DC | PRN

## 2021-09-29 MED ORDER — METHYLPREDNISOLONE SODIUM SUCC 125 MG IJ SOLR
125.0000 mg | INTRAMUSCULAR | Status: AC
  Administered 2021-09-29: 15:00:00 125 mg via INTRAVENOUS
  Filled 2021-09-29: qty 2

## 2021-09-29 MED ORDER — IOHEXOL 350 MG/ML SOLN
75.0000 mL | Freq: Once | INTRAVENOUS | Status: AC | PRN
Start: 2021-09-29 — End: 2021-09-29
  Administered 2021-09-29: 15:00:00 75 mL via INTRAVENOUS

## 2021-09-29 MED ORDER — IOHEXOL 350 MG/ML SOLN: 75.0000 mL | Freq: Once | INTRAVENOUS | Status: DC | PRN

## 2021-09-29 MED ORDER — METOCLOPRAMIDE HCL 5 MG/ML IJ SOLN
10.0000 mg | INTRAMUSCULAR | Status: AC
  Administered 2021-09-29: 15:00:00 10 mg via INTRAVENOUS
  Filled 2021-09-29: qty 2

## 2021-09-29 MED ORDER — DIPHENHYDRAMINE HCL 50 MG/ML IJ SOLN
25.0000 mg | Freq: Once | INTRAMUSCULAR | Status: AC
  Administered 2021-09-29: 15:00:00 25 mg via INTRAVENOUS
  Filled 2021-09-29: qty 1

## 2021-09-29 NOTE — ED Provider Notes (Signed)
[facility] Provider Note    Event Date/Time   First MD Initiated Contact with Patient 09/29/21 1243     (approximate)   History   Weakness   HPI  FRIEDRICH HARRIOTT is a 81 y.o. male with a history of atrial fibrillation, CAD, diabetes, chronic musculoskeletal pain who comes ED complaining of bilateral neck pain and posterior headache.  He states this is been going on for a few months, and gradually worsening, but also feels like it is acutely worsened in the last few days.  No vision changes, no acute change in balance or motor strength.  Denies falls or trauma.  No chest pain or shortness of breath.     Physical Exam   Triage Vital Signs: ED Triage Vitals  Enc Vitals Group     BP 09/29/21 1100 (!) 115/57     Pulse Rate 09/29/21 1100 70     Resp 09/29/21 1100 16     Temp 09/29/21 1101 97.8 F (36.6 C)     Temp Source 09/29/21 1101 Oral     SpO2 09/29/21 1100 99 %     Weight 09/29/21 1101 233 lb (105.7 kg)     Height 09/29/21 1101 6\' 2"  (1.88 m)     Head Circumference --      Peak Flow --      Pain Score 09/29/21 1101 10     Pain Loc --      Pain Edu? --      Excl. in [place]? --     Most recent vital signs: Vitals:   09/29/21 1330 09/29/21 1430  BP: (!) 117/54 (!) 113/54  Pulse: (!) 59 (!) 101  Resp: 18 18  Temp:    SpO2: 98% 98%     General: Awake, no distress.  CV:  Good peripheral perfusion.  Irregularly irregular rhythm.  Normal pulses Resp:  Normal effort.  Unlabored, clear to auscultation bilaterally Abd:  No distention.  Soft, nontender Other:  No lower extremity edema.  Moves all extremities.  No focal neurologic deficits.   ED Results / Procedures / Treatments   Labs (all labs ordered are listed, but only abnormal results are displayed) Labs Reviewed  BASIC METABOLIC PANEL - Abnormal; Notable for the following components:      Result Value   Sodium 133 (*)    Glucose, Bld 222 (*)    All other components within normal  limits  CBC - Abnormal; Notable for the following components:   RBC 4.02 (*)    Hemoglobin 11.9 (*)    HCT 35.8 (*)    RDW 16.0 (*)    Platelets 73 (*)    All other components within normal limits  URINALYSIS, ROUTINE W REFLEX MICROSCOPIC - Abnormal; Notable for the following components:   APPearance CLEAR (*)    All other components within normal limits  PROTIME-INR - Abnormal; Notable for the following components:   Prothrombin Time 17.2 (*)    INR 1.4 (*)    All other components within normal limits  CK  HEPATIC FUNCTION PANEL  CBG MONITORING, ED  TROPONIN I (HIGH SENSITIVITY)  TROPONIN I (HIGH SENSITIVITY)     EKG  Interpreted by me Atrial fibrillation, rate 72.  Normal axis, normal intervals.  Poor R wave progression.  Normal ST segments and T waves.  1 PVC on the strip.   RADIOLOGY CT head viewed and interpreted by me, no intracranial hemorrhage.  Radiology report reviewed  PROCEDURES:  Critical Care performed: No  Procedures   MEDICATIONS ORDERED IN ED: Medications  metoCLOPramide (REGLAN) injection 10 mg (10 mg Intravenous Given 09/29/21 1436)  methylPREDNISolone sodium succinate (SOLU-MEDROL) 125 mg/2 mL injection 125 mg (125 mg Intravenous Given 09/29/21 1436)  diphenhydrAMINE (BENADRYL) injection 25 mg (25 mg Intravenous Given 09/29/21 1435)  iohexol (OMNIPAQUE) 350 MG/ML injection 75 mL (75 mLs Intravenous Contrast Given 09/29/21 1444)     IMPRESSION / MDM / ASSESSMENT AND PLAN / ED COURSE  I reviewed the triage vital signs and the nursing notes.                              Differential diagnosis includes, but is not limited to, intracranial hemorrhage, carotid dissection, cerebral aneurysm, chronic musculoskeletal pain, electrolyte abnormality, statin myositis     Patient presents with acute on chronic pain of the neck and head, most likely musculoskeletal and tension type headache.  We will give Reglan Benadryl and Solu-Medrol for pain relief  while obtaining a CT angiogram of the head and neck to rule out vascular issue.  His CT is reassuring, we will do a trial of Robaxin for the patient's symptoms until he can follow-up with primary care.      FINAL CLINICAL IMPRESSION(S) / ED DIAGNOSES   Final diagnoses:  Episodic tension-type headache, not intractable  Chronic musculoskeletal pain     Rx / DC Orders   ED Discharge Orders          Ordered    methocarbamol (ROBAXIN) 500 MG tablet  Every 8 hours PRN        09/29/21 1445             Note:  This document was prepared using Dragon voice recognition software and may include unintentional dictation errors.   Carrie Mew, MD 09/29/21 1451

## 2021-09-29 NOTE — ED Triage Notes (Signed)
Pt here with weakness and bilateral neck pain. Pt states he had a MI back in Dec 2022. Pt having balance issues and c/o of a headache. Pt in NAD in triage.

## 2021-09-29 NOTE — ED Notes (Signed)
Patient to CT scan at this time

## 2021-09-29 NOTE — ED Provider Notes (Signed)
----------------------------------------- °  3:39 PM on 09/29/2021 -----------------------------------------  I took over care of this patient from Dr. Joni Fears.  The patient was pending a CT angio of the head and neck.  I reviewed the images I do not see any evidence of ICH or aneurysm.  I also reviewed the radiology report which confirms no acute findings:  IMPRESSION:  1. No acute intracranial pathology.  2. No evidence of dissection, aneurysm, or other acute vascular  pathology.  3. Soft and calcified atherosclerotic plaque in the left carotid  bulb resulting in approximately 40-50% stenosis by NASCET criteria.  Mild plaque at the right carotid bulb without hemodynamically  significant stenosis. Patent vertebral arteries.  4. Calcified atherosclerotic plaque in the bilateral intracranial  ICAs and left V4 segment resulting in no more than mild stenosis.  Otherwise, patent intracranial vasculature.  5. Layering fluid in the left maxillary sinus which can be seen with  acute sinusitis in the correct clinical setting.  6. Degenerative changes in the cervical spine with at least moderate  spinal canal stenosis at C4-C5 with probable mass effect on the  cord. This could be further evaluated with cervical spine MRI as  indicated.     Aortic Atherosclerosis (ICD10-I70.0).   On reassessment, the patient appears relatively comfortable.  I repeated upper extremity neuro exam and the patient has no motor weakness.  I informed the patient about the carotid atherosclerosis and cervical degenerative changes.  There is no indication for emergent MRI.  At this time, the patient is stable for discharge home.  I counseled him on the results of the work-up.  Return precautions given, and he expresses understanding.  Dr. Joni Fears had prescribed additional pain medication for the patient.   Arta Silence, MD 09/29/21 1540

## 2021-09-29 NOTE — Discharge Instructions (Addendum)
Your CT scan shows some narrowing of your left carotid artery from plaque, and there is also degeneration of the spine in your neck which can cause pain by pushing on your spinal cord.   Follow up with your regular doctor.  Take the methocarbamol prescribed today in addition to your other medications.   Return to the ER for new, worsening, or persistent severe headache, weakness or numbness in the arms or legs, difficulty with speech, changes in vision, high fever, neck stiffness, or any other new or worsening symptoms that concern you.

## 2021-10-01 ENCOUNTER — Telehealth: Payer: Self-pay | Admitting: Cardiovascular Disease

## 2021-10-01 NOTE — Telephone Encounter (Signed)
Patient is calling for test result done on Monday, please assist

## 2021-10-22 ENCOUNTER — Other Ambulatory Visit: Payer: Self-pay | Admitting: Urology

## 2021-10-22 ENCOUNTER — Other Ambulatory Visit: Payer: Self-pay | Admitting: Cardiovascular Disease

## 2021-10-22 NOTE — Telephone Encounter (Signed)
Prescription refill request for Eliquis received. Indication: Atrial fib Last office visit: 09/28/21  Johnny Bridge MD Scr: 1.0 on 10/09/21 Age: 81 Weight: 105.9kg  Based on above findings Eliquis 5mg  twice daily is the appropriate dose.  Refill approved.

## 2021-10-22 NOTE — Telephone Encounter (Signed)
Refill request

## 2021-10-27 ENCOUNTER — Other Ambulatory Visit: Payer: Self-pay | Admitting: Family Medicine

## 2021-10-27 DIAGNOSIS — M5412 Radiculopathy, cervical region: Secondary | ICD-10-CM

## 2021-11-04 ENCOUNTER — Other Ambulatory Visit: Payer: Self-pay

## 2021-11-04 ENCOUNTER — Ambulatory Visit
Admission: RE | Admit: 2021-11-04 | Discharge: 2021-11-04 | Disposition: A | Discharge status: Home / Self Care | Payer: Medicare HMO | Source: Ambulatory Visit | Attending: Family Medicine | Admitting: Family Medicine

## 2021-11-04 DIAGNOSIS — M5412 Radiculopathy, cervical region: Secondary | ICD-10-CM | POA: Diagnosis not present

## 2021-11-07 ENCOUNTER — Other Ambulatory Visit: Payer: Self-pay | Admitting: Urology

## 2021-11-12 ENCOUNTER — Other Ambulatory Visit: Payer: Self-pay | Admitting: Urology

## 2021-12-08 ENCOUNTER — Other Ambulatory Visit: Payer: Self-pay | Admitting: Urology

## 2022-01-04 ENCOUNTER — Other Ambulatory Visit: Payer: Self-pay | Admitting: Urology

## 2022-02-05 ENCOUNTER — Other Ambulatory Visit: Payer: Self-pay | Admitting: Cardiovascular Disease

## 2022-02-05 ENCOUNTER — Other Ambulatory Visit: Payer: Self-pay | Admitting: Urology

## 2022-03-05 ENCOUNTER — Telehealth: Payer: Self-pay | Admitting: Urology

## 2022-03-05 ENCOUNTER — Other Ambulatory Visit: Payer: Self-pay | Admitting: Urology

## 2022-03-05 NOTE — Telephone Encounter (Signed)
Sw wife, she will have patient return my call to schedulle appointment

## 2022-03-05 NOTE — Telephone Encounter (Signed)
I keep receiving refill request for his BPH meds, but he has not been seen since 2021.  Please call the patient and have him see me on Monday so that we can get him evaluated and his medications refilled.

## 2022-03-22 ENCOUNTER — Inpatient Hospital Stay
Admission: EM | Admit: 2022-03-22 | Discharge: 2022-03-31 | DRG: 286 | Disposition: A | Discharge status: Home Health | Payer: Medicare HMO | Attending: Internal Medicine | Admitting: Internal Medicine

## 2022-03-22 ENCOUNTER — Inpatient Hospital Stay: Payer: Medicare HMO

## 2022-03-22 ENCOUNTER — Other Ambulatory Visit: Payer: Self-pay

## 2022-03-22 ENCOUNTER — Emergency Department: Payer: Medicare HMO

## 2022-03-22 DIAGNOSIS — I9589 Other hypotension: Secondary | ICD-10-CM | POA: Diagnosis not present

## 2022-03-22 DIAGNOSIS — D696 Thrombocytopenia, unspecified: Secondary | ICD-10-CM | POA: Diagnosis present

## 2022-03-22 DIAGNOSIS — I10 Essential (primary) hypertension: Secondary | ICD-10-CM | POA: Diagnosis present

## 2022-03-22 DIAGNOSIS — I200 Unstable angina: Secondary | ICD-10-CM

## 2022-03-22 DIAGNOSIS — D649 Anemia, unspecified: Secondary | ICD-10-CM | POA: Diagnosis present

## 2022-03-22 DIAGNOSIS — N138 Other obstructive and reflux uropathy: Secondary | ICD-10-CM | POA: Diagnosis present

## 2022-03-22 DIAGNOSIS — I252 Old myocardial infarction: Secondary | ICD-10-CM

## 2022-03-22 DIAGNOSIS — Z7901 Long term (current) use of anticoagulants: Secondary | ICD-10-CM | POA: Diagnosis not present

## 2022-03-22 DIAGNOSIS — I214 Non-ST elevation (NSTEMI) myocardial infarction: Secondary | ICD-10-CM | POA: Diagnosis present

## 2022-03-22 DIAGNOSIS — R531 Weakness: Secondary | ICD-10-CM | POA: Diagnosis present

## 2022-03-22 DIAGNOSIS — E669 Obesity, unspecified: Secondary | ICD-10-CM | POA: Diagnosis present

## 2022-03-22 DIAGNOSIS — Z79899 Other long term (current) drug therapy: Secondary | ICD-10-CM

## 2022-03-22 DIAGNOSIS — I340 Nonrheumatic mitral (valve) insufficiency: Secondary | ICD-10-CM | POA: Diagnosis not present

## 2022-03-22 DIAGNOSIS — R55 Syncope and collapse: Principal | ICD-10-CM

## 2022-03-22 DIAGNOSIS — R000 Tachycardia, unspecified: Secondary | ICD-10-CM | POA: Diagnosis not present

## 2022-03-22 DIAGNOSIS — I5043 Acute on chronic combined systolic (congestive) and diastolic (congestive) heart failure: Secondary | ICD-10-CM | POA: Diagnosis present

## 2022-03-22 DIAGNOSIS — I4821 Permanent atrial fibrillation: Secondary | ICD-10-CM | POA: Diagnosis present

## 2022-03-22 DIAGNOSIS — Z86711 Personal history of pulmonary embolism: Secondary | ICD-10-CM

## 2022-03-22 DIAGNOSIS — I2510 Atherosclerotic heart disease of native coronary artery without angina pectoris: Secondary | ICD-10-CM | POA: Diagnosis present

## 2022-03-22 DIAGNOSIS — R079 Chest pain, unspecified: Secondary | ICD-10-CM | POA: Diagnosis present

## 2022-03-22 DIAGNOSIS — E876 Hypokalemia: Secondary | ICD-10-CM | POA: Diagnosis present

## 2022-03-22 DIAGNOSIS — N401 Enlarged prostate with lower urinary tract symptoms: Secondary | ICD-10-CM | POA: Diagnosis present

## 2022-03-22 DIAGNOSIS — D693 Immune thrombocytopenic purpura: Secondary | ICD-10-CM

## 2022-03-22 DIAGNOSIS — Z87442 Personal history of urinary calculi: Secondary | ICD-10-CM

## 2022-03-22 DIAGNOSIS — I248 Other forms of acute ischemic heart disease: Secondary | ICD-10-CM | POA: Diagnosis present

## 2022-03-22 DIAGNOSIS — K317 Polyp of stomach and duodenum: Secondary | ICD-10-CM | POA: Diagnosis present

## 2022-03-22 DIAGNOSIS — I110 Hypertensive heart disease with heart failure: Secondary | ICD-10-CM | POA: Diagnosis present

## 2022-03-22 DIAGNOSIS — K219 Gastro-esophageal reflux disease without esophagitis: Secondary | ICD-10-CM | POA: Diagnosis present

## 2022-03-22 DIAGNOSIS — E785 Hyperlipidemia, unspecified: Secondary | ICD-10-CM | POA: Diagnosis present

## 2022-03-22 DIAGNOSIS — Z8711 Personal history of peptic ulcer disease: Secondary | ICD-10-CM

## 2022-03-22 DIAGNOSIS — I5033 Acute on chronic diastolic (congestive) heart failure: Secondary | ICD-10-CM

## 2022-03-22 DIAGNOSIS — Z9049 Acquired absence of other specified parts of digestive tract: Secondary | ICD-10-CM

## 2022-03-22 DIAGNOSIS — I482 Chronic atrial fibrillation, unspecified: Secondary | ICD-10-CM

## 2022-03-22 DIAGNOSIS — R778 Other specified abnormalities of plasma proteins: Secondary | ICD-10-CM | POA: Diagnosis not present

## 2022-03-22 DIAGNOSIS — D509 Iron deficiency anemia, unspecified: Secondary | ICD-10-CM | POA: Diagnosis present

## 2022-03-22 DIAGNOSIS — I5041 Acute combined systolic (congestive) and diastolic (congestive) heart failure: Secondary | ICD-10-CM | POA: Diagnosis not present

## 2022-03-22 DIAGNOSIS — Z808 Family history of malignant neoplasm of other organs or systems: Secondary | ICD-10-CM

## 2022-03-22 DIAGNOSIS — I081 Rheumatic disorders of both mitral and tricuspid valves: Secondary | ICD-10-CM | POA: Diagnosis present

## 2022-03-22 DIAGNOSIS — K922 Gastrointestinal hemorrhage, unspecified: Secondary | ICD-10-CM | POA: Diagnosis not present

## 2022-03-22 DIAGNOSIS — I361 Nonrheumatic tricuspid (valve) insufficiency: Secondary | ICD-10-CM | POA: Diagnosis not present

## 2022-03-22 DIAGNOSIS — I5032 Chronic diastolic (congestive) heart failure: Secondary | ICD-10-CM | POA: Diagnosis not present

## 2022-03-22 DIAGNOSIS — N400 Enlarged prostate without lower urinary tract symptoms: Secondary | ICD-10-CM | POA: Diagnosis present

## 2022-03-22 DIAGNOSIS — I272 Pulmonary hypertension, unspecified: Secondary | ICD-10-CM | POA: Diagnosis present

## 2022-03-22 DIAGNOSIS — I2582 Chronic total occlusion of coronary artery: Secondary | ICD-10-CM | POA: Diagnosis present

## 2022-03-22 DIAGNOSIS — Z6831 Body mass index (BMI) 31.0-31.9, adult: Secondary | ICD-10-CM | POA: Diagnosis not present

## 2022-03-22 DIAGNOSIS — E1165 Type 2 diabetes mellitus with hyperglycemia: Secondary | ICD-10-CM | POA: Diagnosis present

## 2022-03-22 DIAGNOSIS — Z7982 Long term (current) use of aspirin: Secondary | ICD-10-CM

## 2022-03-22 DIAGNOSIS — Z8249 Family history of ischemic heart disease and other diseases of the circulatory system: Secondary | ICD-10-CM

## 2022-03-22 DIAGNOSIS — E119 Type 2 diabetes mellitus without complications: Secondary | ICD-10-CM

## 2022-03-22 DIAGNOSIS — F419 Anxiety disorder, unspecified: Secondary | ICD-10-CM | POA: Diagnosis present

## 2022-03-22 DIAGNOSIS — Z8719 Personal history of other diseases of the digestive system: Secondary | ICD-10-CM

## 2022-03-22 DIAGNOSIS — Z955 Presence of coronary angioplasty implant and graft: Secondary | ICD-10-CM

## 2022-03-22 DIAGNOSIS — D508 Other iron deficiency anemias: Secondary | ICD-10-CM | POA: Diagnosis not present

## 2022-03-22 DIAGNOSIS — I493 Ventricular premature depolarization: Secondary | ICD-10-CM | POA: Diagnosis present

## 2022-03-22 DIAGNOSIS — I25110 Atherosclerotic heart disease of native coronary artery with unstable angina pectoris: Secondary | ICD-10-CM | POA: Diagnosis present

## 2022-03-22 DIAGNOSIS — Z87891 Personal history of nicotine dependence: Secondary | ICD-10-CM

## 2022-03-22 DIAGNOSIS — I25118 Atherosclerotic heart disease of native coronary artery with other forms of angina pectoris: Secondary | ICD-10-CM | POA: Diagnosis not present

## 2022-03-22 LAB — CBC
HCT: 24.6 % — ABNORMAL LOW (ref 39.0–52.0)
HCT: 23.1 % — ABNORMAL LOW (ref 39.0–52.0)
HCT: 24.3 % — ABNORMAL LOW (ref 39.0–52.0)
Hemoglobin: 8.1 g/dL — ABNORMAL LOW (ref 13.0–17.0)
Hemoglobin: 7.7 g/dL — ABNORMAL LOW (ref 13.0–17.0)
Hemoglobin: 8.5 g/dL — ABNORMAL LOW (ref 13.0–17.0)
MCH: 29.7 pg (ref 26.0–34.0)
MCH: 30 pg (ref 26.0–34.0)
MCH: 30.2 pg (ref 26.0–34.0)
MCHC: 32.9 g/dL (ref 30.0–36.0)
MCHC: 33.3 g/dL (ref 30.0–36.0)
MCHC: 35 g/dL (ref 30.0–36.0)
MCV: 90.1 fL (ref 80.0–100.0)
MCV: 89.9 fL (ref 80.0–100.0)
MCV: 86.5 fL (ref 80.0–100.0)
Platelets: 64 10*3/uL — ABNORMAL LOW (ref 150–400)
Platelets: 69 10*3/uL — ABNORMAL LOW (ref 150–400)
Platelets: 62 10*3/uL — ABNORMAL LOW (ref 150–400)
RBC: 2.73 MIL/uL — ABNORMAL LOW (ref 4.22–5.81)
RBC: 2.57 MIL/uL — ABNORMAL LOW (ref 4.22–5.81)
RBC: 2.81 MIL/uL — ABNORMAL LOW (ref 4.22–5.81)
RDW: 18.2 % — ABNORMAL HIGH (ref 11.5–15.5)
RDW: 18.2 % — ABNORMAL HIGH (ref 11.5–15.5)
RDW: 17.3 % — ABNORMAL HIGH (ref 11.5–15.5)
WBC: 9.4 10*3/uL (ref 4.0–10.5)
WBC: 9.6 10*3/uL (ref 4.0–10.5)
WBC: 10.1 10*3/uL (ref 4.0–10.5)
nRBC: 0 % (ref 0.0–0.2)
nRBC: 0 % (ref 0.0–0.2)
nRBC: 0 % (ref 0.0–0.2)

## 2022-03-22 LAB — BASIC METABOLIC PANEL
Anion gap: 8 (ref 5–15)
BUN: 21 mg/dL (ref 8–23)
CO2: 21 mmol/L — ABNORMAL LOW (ref 22–32)
Calcium: 8.5 mg/dL — ABNORMAL LOW (ref 8.9–10.3)
Chloride: 106 mmol/L (ref 98–111)
Creatinine, Ser: 1.15 mg/dL (ref 0.61–1.24)
GFR, Estimated: >60 mL/min (ref >60)
Glucose, Bld: 249 mg/dL — ABNORMAL HIGH (ref 70–99)
Potassium: 4.5 mmol/L (ref 3.5–5.1)
Sodium: 135 mmol/L (ref 135–145)

## 2022-03-22 LAB — IRON AND TIBC
Iron: 29 ug/dL — ABNORMAL LOW (ref 45–182)
Saturation Ratios: 13 % — ABNORMAL LOW (ref 17.9–39.5)
TIBC: 218 ug/dL — ABNORMAL LOW (ref 250–450)
UIBC: 189 ug/dL

## 2022-03-22 LAB — RETICULOCYTES
Immature Retic Fract: 25.7 % — ABNORMAL HIGH (ref 2.3–15.9)
RBC.: 2.61 MIL/uL — ABNORMAL LOW (ref 4.22–5.81)
Retic Count, Absolute: 84 10*3/uL (ref 19.0–186.0)
Retic Ct Pct: 3.2 % — ABNORMAL HIGH (ref 0.4–3.1)

## 2022-03-22 LAB — LIPID PANEL
Cholesterol: 62 mg/dL (ref 0–200)
HDL: 20 mg/dL — ABNORMAL LOW (ref >40)
LDL Cholesterol: 27 mg/dL (ref 0–99)
Total CHOL/HDL Ratio: 3.1 RATIO
Triglycerides: 75 mg/dL (ref <150)
VLDL: 15 mg/dL (ref 0–40)

## 2022-03-22 LAB — GLUCOSE, CAPILLARY: Glucose-Capillary: 193 mg/dL — ABNORMAL HIGH (ref 70–99)

## 2022-03-22 LAB — TROPONIN I (HIGH SENSITIVITY)
Troponin I (High Sensitivity): 69 ng/L — ABNORMAL HIGH (ref <18)
Troponin I (High Sensitivity): 72 ng/L — ABNORMAL HIGH (ref <18)
Troponin I (High Sensitivity): 66 ng/L — ABNORMAL HIGH (ref <18)
Troponin I (High Sensitivity): 62 ng/L — ABNORMAL HIGH (ref <18)
Troponin I (High Sensitivity): 72 ng/L — ABNORMAL HIGH (ref <18)

## 2022-03-22 LAB — HEMOGLOBIN A1C
Hgb A1c MFr Bld: 7.6 % — ABNORMAL HIGH (ref 4.8–5.6)
Mean Plasma Glucose: 171.42 mg/dL

## 2022-03-22 LAB — FOLATE: Folate: 21.4 ng/mL (ref >5.9)

## 2022-03-22 LAB — HEPARIN LEVEL (UNFRACTIONATED): Heparin Unfractionated: >1.1 IU/mL — ABNORMAL HIGH (ref 0.30–0.70)

## 2022-03-22 LAB — APTT
aPTT: 58 seconds — ABNORMAL HIGH (ref 24–36)
aPTT: 75 seconds — ABNORMAL HIGH (ref 24–36)

## 2022-03-22 LAB — FERRITIN: Ferritin: 229 ng/mL (ref 24–336)

## 2022-03-22 LAB — PROTIME-INR
INR: 2 — ABNORMAL HIGH (ref 0.8–1.2)
Prothrombin Time: 22.3 seconds — ABNORMAL HIGH (ref 11.4–15.2)

## 2022-03-22 LAB — BRAIN NATRIURETIC PEPTIDE: B Natriuretic Peptide: 431 pg/mL — ABNORMAL HIGH (ref 0.0–100.0)

## 2022-03-22 LAB — PREPARE RBC (CROSSMATCH)

## 2022-03-22 LAB — CBG MONITORING, ED: Glucose-Capillary: 168 mg/dL — ABNORMAL HIGH (ref 70–99)

## 2022-03-22 MED ORDER — PANTOPRAZOLE SODIUM 40 MG PO TBEC: 40.0000 mg | DELAYED_RELEASE_TABLET | Freq: Two times a day (BID) | ORAL | Status: DC

## 2022-03-22 MED ORDER — OXYCODONE HCL 5 MG PO TABS
10.0000 mg | ORAL_TABLET | ORAL | Status: DC | PRN
  Administered 2022-03-22 – 2022-03-31 (×26): 10 mg via ORAL
  Filled 2022-03-22 (×26): qty 2

## 2022-03-22 MED ORDER — ATORVASTATIN CALCIUM 20 MG PO TABS
40.0000 mg | ORAL_TABLET | Freq: Every day | ORAL | Status: DC
  Administered 2022-03-22 – 2022-03-30 (×9): 40 mg via ORAL
  Filled 2022-03-22 (×9): qty 2

## 2022-03-22 MED ORDER — METHOCARBAMOL 500 MG PO TABS
500.0000 mg | ORAL_TABLET | Freq: Three times a day (TID) | ORAL | Status: DC | PRN
Start: 2022-03-22 — End: 2022-03-31
  Administered 2022-03-24 – 2022-03-26 (×4): 500 mg via ORAL
  Filled 2022-03-22 (×5): qty 1

## 2022-03-22 MED ORDER — ACETAMINOPHEN 325 MG PO TABS
650.0000 mg | ORAL_TABLET | Freq: Four times a day (QID) | ORAL | Status: DC | PRN
  Administered 2022-03-24 – 2022-03-28 (×5): 650 mg via ORAL
  Filled 2022-03-22 (×6): qty 2

## 2022-03-22 MED ORDER — FUROSEMIDE 40 MG PO TABS: 40.0000 mg | ORAL_TABLET | Freq: Two times a day (BID) | ORAL | Status: DC

## 2022-03-22 MED ORDER — FUROSEMIDE 10 MG/ML IJ SOLN
40.0000 mg | Freq: Once | INTRAMUSCULAR | Status: AC
  Administered 2022-03-22: 40 mg via INTRAVENOUS
  Filled 2022-03-22: qty 4

## 2022-03-22 MED ORDER — METOPROLOL SUCCINATE ER 50 MG PO TB24
50.0000 mg | ORAL_TABLET | Freq: Every day | ORAL | Status: DC
  Administered 2022-03-23 – 2022-03-25 (×3): 50 mg via ORAL
  Filled 2022-03-22 (×3): qty 1

## 2022-03-22 MED ORDER — ONDANSETRON HCL 4 MG/2ML IJ SOLN
4.0000 mg | Freq: Three times a day (TID) | INTRAMUSCULAR | Status: DC | PRN
  Administered 2022-03-26 – 2022-03-30 (×3): 4 mg via INTRAVENOUS
  Filled 2022-03-22 (×3): qty 2

## 2022-03-22 MED ORDER — CO Q-10 200 MG PO CAPS: 200.0000 mg | ORAL_CAPSULE | Freq: Every day | ORAL | Status: DC

## 2022-03-22 MED ORDER — FINASTERIDE 5 MG PO TABS
5.0000 mg | ORAL_TABLET | Freq: Every day | ORAL | Status: DC
  Administered 2022-03-23 – 2022-03-31 (×9): 5 mg via ORAL
  Filled 2022-03-22 (×9): qty 1

## 2022-03-22 MED ORDER — ACETAMINOPHEN 325 MG PO TABS
325.0000 mg | ORAL_TABLET | Freq: Four times a day (QID) | ORAL | Status: DC | PRN
Start: 2022-03-22 — End: 2022-03-31
  Administered 2022-03-22 – 2022-03-30 (×9): 325 mg via ORAL
  Filled 2022-03-22 (×8): qty 1

## 2022-03-22 MED ORDER — SODIUM CHLORIDE 0.9% IV SOLUTION
Freq: Once | INTRAVENOUS | Status: AC
  Filled 2022-03-22: qty 250

## 2022-03-22 MED ORDER — OXYCODONE-ACETAMINOPHEN 10-325 MG PO TABS: 1.0000 | ORAL_TABLET | ORAL | Status: DC | PRN

## 2022-03-22 MED ORDER — HEPARIN (PORCINE) 25000 UT/250ML-% IV SOLN
1200.0000 [IU]/h | INTRAVENOUS | Status: AC
  Administered 2022-03-22 – 2022-03-23 (×2): 1200 [IU]/h via INTRAVENOUS
  Filled 2022-03-22 (×2): qty 250

## 2022-03-22 MED ORDER — OMEGA-3-ACID ETHYL ESTERS 1 G PO CAPS
2.0000 g | ORAL_CAPSULE | Freq: Every day | ORAL | Status: DC
  Administered 2022-03-23 – 2022-03-31 (×9): 2 g via ORAL
  Filled 2022-03-22 (×9): qty 2

## 2022-03-22 MED ORDER — LISINOPRIL 10 MG PO TABS
10.0000 mg | ORAL_TABLET | Freq: Every day | ORAL | Status: DC
  Administered 2022-03-23: 10 mg via ORAL
  Filled 2022-03-22: qty 1

## 2022-03-22 MED ORDER — SUCRALFATE 1 G PO TABS: 1.0000 g | ORAL_TABLET | Freq: Four times a day (QID) | ORAL | Status: DC

## 2022-03-22 MED ORDER — CYANOCOBALAMIN 1000 MCG/ML IJ SOLN
1000.0000 ug | INTRAMUSCULAR | Status: DC
  Administered 2022-03-26: 1000 ug via INTRAMUSCULAR
  Filled 2022-03-22: qty 1

## 2022-03-22 MED ORDER — OXYCODONE HCL 5 MG PO TABS: 5.0000 mg | ORAL_TABLET | Freq: Four times a day (QID) | ORAL | Status: DC | PRN

## 2022-03-22 MED ORDER — MORPHINE SULFATE (PF) 2 MG/ML IV SOLN
2.0000 mg | INTRAVENOUS | Status: DC | PRN
  Administered 2022-03-23 – 2022-03-28 (×8): 2 mg via INTRAVENOUS
  Filled 2022-03-22 (×8): qty 1

## 2022-03-22 MED ORDER — DM-GUAIFENESIN ER 30-600 MG PO TB12: 1.0000 | ORAL_TABLET | Freq: Two times a day (BID) | ORAL | Status: DC | PRN

## 2022-03-22 MED ORDER — ADULT MULTIVITAMIN W/MINERALS CH
1.0000 | ORAL_TABLET | Freq: Every day | ORAL | Status: DC
Start: 2022-03-23 — End: 2022-03-31
  Administered 2022-03-23 – 2022-03-31 (×9): 1 via ORAL
  Filled 2022-03-22 (×9): qty 1

## 2022-03-22 MED ORDER — FUROSEMIDE 40 MG PO TABS
40.0000 mg | ORAL_TABLET | Freq: Two times a day (BID) | ORAL | Status: DC
  Administered 2022-03-23: 40 mg via ORAL
  Filled 2022-03-22: qty 1

## 2022-03-22 MED ORDER — TAMSULOSIN HCL 0.4 MG PO CAPS
0.4000 mg | ORAL_CAPSULE | Freq: Every day | ORAL | Status: DC
  Administered 2022-03-23 – 2022-03-31 (×9): 0.4 mg via ORAL
  Filled 2022-03-22 (×9): qty 1

## 2022-03-22 MED ORDER — INSULIN ASPART 100 UNIT/ML IJ SOLN
0.0000 [IU] | Freq: Every day | INTRAMUSCULAR | Status: DC
  Administered 2022-03-24 – 2022-03-26 (×3): 2 [IU] via SUBCUTANEOUS
  Administered 2022-03-27: 3 [IU] via SUBCUTANEOUS
  Administered 2022-03-29: 2 [IU] via SUBCUTANEOUS
  Filled 2022-03-22 (×5): qty 1

## 2022-03-22 MED ORDER — PANTOPRAZOLE SODIUM 40 MG PO TBEC: 40.0000 mg | DELAYED_RELEASE_TABLET | Freq: Every day | ORAL | Status: DC

## 2022-03-22 MED ORDER — HYDRALAZINE HCL 20 MG/ML IJ SOLN
5.0000 mg | INTRAMUSCULAR | Status: DC | PRN
Start: 2022-03-22 — End: 2022-03-31

## 2022-03-22 MED ORDER — ALBUTEROL SULFATE (2.5 MG/3ML) 0.083% IN NEBU: 3.0000 mL | INHALATION_SOLUTION | RESPIRATORY_TRACT | Status: DC | PRN

## 2022-03-22 MED ORDER — PANTOPRAZOLE SODIUM 40 MG IV SOLR
40.0000 mg | Freq: Two times a day (BID) | INTRAVENOUS | Status: DC
  Administered 2022-03-22 – 2022-03-26 (×8): 40 mg via INTRAVENOUS
  Filled 2022-03-22 (×8): qty 10

## 2022-03-22 MED ORDER — ASPIRIN 81 MG PO TBEC
81.0000 mg | DELAYED_RELEASE_TABLET | Freq: Every day | ORAL | Status: DC
  Administered 2022-03-23 – 2022-03-29 (×7): 81 mg via ORAL
  Filled 2022-03-22 (×9): qty 1

## 2022-03-22 MED ORDER — INSULIN ASPART 100 UNIT/ML IJ SOLN
0.0000 [IU] | Freq: Three times a day (TID) | INTRAMUSCULAR | Status: DC
  Administered 2022-03-22: 2 [IU] via SUBCUTANEOUS
  Administered 2022-03-23: 1 [IU] via SUBCUTANEOUS
  Administered 2022-03-23: 3 [IU] via SUBCUTANEOUS
  Administered 2022-03-23: 2 [IU] via SUBCUTANEOUS
  Administered 2022-03-24: 1 [IU] via SUBCUTANEOUS
  Administered 2022-03-25 (×2): 3 [IU] via SUBCUTANEOUS
  Administered 2022-03-25: 1 [IU] via SUBCUTANEOUS
  Administered 2022-03-26 (×2): 2 [IU] via SUBCUTANEOUS
  Administered 2022-03-26: 5 [IU] via SUBCUTANEOUS
  Administered 2022-03-27: 3 [IU] via SUBCUTANEOUS
  Administered 2022-03-27: 1 [IU] via SUBCUTANEOUS
  Administered 2022-03-27: 5 [IU] via SUBCUTANEOUS
  Administered 2022-03-28: 3 [IU] via SUBCUTANEOUS
  Administered 2022-03-28: 5 [IU] via SUBCUTANEOUS
  Administered 2022-03-28: 1 [IU] via SUBCUTANEOUS
  Administered 2022-03-29: 3 [IU] via SUBCUTANEOUS
  Administered 2022-03-29: 5 [IU] via SUBCUTANEOUS
  Administered 2022-03-29: 2 [IU] via SUBCUTANEOUS
  Administered 2022-03-30 (×2): 5 [IU] via SUBCUTANEOUS
  Administered 2022-03-31 (×2): 2 [IU] via SUBCUTANEOUS
  Filled 2022-03-22 (×21): qty 1

## 2022-03-22 MED ORDER — DIAZEPAM 5 MG PO TABS
5.0000 mg | ORAL_TABLET | Freq: Three times a day (TID) | ORAL | Status: DC | PRN
  Administered 2022-03-23 – 2022-03-29 (×5): 5 mg via ORAL
  Filled 2022-03-22 (×5): qty 1

## 2022-03-22 NOTE — Assessment & Plan Note (Signed)
Lipitor 

## 2022-03-22 NOTE — Assessment & Plan Note (Signed)
2D echo on 01/22/2021 showed EF of 50-55%.  Patient has shortness of breath associated with chest pain, elevated BNP 433,  but patient only has trace leg edema, chest x-ray is negative for pulmonary edema.  No oxygen desaturation.  Patient may have mild fluid overload, but does not seem to have full CHF exacerbation.  -Give 1 dose IV Lasix 40 mg now -Continue home Lasix 40 mg twice daily .

## 2022-03-22 NOTE — Assessment & Plan Note (Signed)
-   IV heparin

## 2022-03-22 NOTE — Assessment & Plan Note (Signed)
NSTEMI and hx of CAD with stent placement: pt has chest pain for 3 days, troponin is elevated at 69, 72.  Consulted Dr. Sophronia Simas of cardiology, who is planning to do cardiac cath on Wednesday  - admit to tele bed as inpatient - switched Eliquis to IV heparin - Trend Trop - pt is allergic to nitroglycerin, - Morphine, and aspirin, lipitor  - Risk factor stratification: will check FLP and A1C

## 2022-03-22 NOTE — Assessment & Plan Note (Signed)
This is a chronic issue.  Platelet 64 (73 on 10/09/2021) -Follow-up with CBC

## 2022-03-22 NOTE — Assessment & Plan Note (Addendum)
Hemoglobin 8.1 (12.7 on 01/07/2022), no active rectal bleeding, but reports dark stool which he attributed to use drinking. -Check INR/PTT/type screen -Follow-up CBC  q8h -check anemia panel -protonix 40 mg bid, oral empirically -will transfuse 1 Unit of blood -consulted GI, Dr. Virgina Jock

## 2022-03-22 NOTE — Progress Notes (Signed)
ANTICOAGULATION CONSULT NOTE - Initial Consult  Pharmacy Consult for Heparin Drip Indication: chest pain/ACS  Allergies  Allergen Reactions   Novocain [Procaine] Other (See Comments)    Syncope   Procaine Hcl Other (See Comments)    Passes out   Lyrica [Pregabalin]    Neurontin [Gabapentin]    Amitriptyline Other (See Comments)    Sleepy   Dicyclomine Other (See Comments)    Other reaction(s): Abdominal Pain   Metformin Nausea Only   Nitroglycerin Other (See Comments)    Makes Blood pressure go to low.     Patient Measurements: Weight: 106.6 kg (235 lb) Heparin Dosing Weight: 99.9 kg  Vital Signs: Temp: 97.9 F (36.6 C) (07/17 2111) Temp Source: Oral (07/17 2111) BP: 110/50 (07/17 2111) Pulse Rate: 96 (07/17 2111)  Labs: Recent Labs    03/22/22 1021 03/22/22 1227 03/22/22 1430 03/22/22 1506 03/22/22 1820 03/22/22 2252  HGB 8.1*  --   --   --  7.7*  --   HCT 24.6*  --   --   --  23.1*  --   PLT 64*  --   --   --  69*  --   APTT  --   --  58*  --   --  75*  LABPROT  --   --  22.3*  --   --   --   INR  --   --  2.0*  --   --   --   HEPARINUNFRC  --   --   --  >1.10*  --   --   CREATININE 1.15  --   --   --   --   --   TROPONINIHS 69* 72* 66*  --  62*  --      Estimated Creatinine Clearance: 66.7 mL/min (by C-G formula based on SCr of 1.15 mg/dL).   Medical History: Past Medical History:  Diagnosis Date   Arthritis    degenerative back    BPH with obstruction/lower urinary tract symptoms    CAD (coronary artery disease) 2010   stent   Diabetes mellitus    ED (erectile dysfunction)    Elevated PSA    GERD (gastroesophageal reflux disease)    Gross hematuria    H/O CT scan    recent renal CT due to hematuria, cystoscopy - normal     Hematuria    History of hiatal hernia    History of kidney stones    HLD (hyperlipidemia)    Hypertension    Ischemic heart disease 1991   with angioplasty   Kidney stone    Morbid obesity ([place])    Myocardial  infarction ([place])    Peripheral neuropathy    Pernicious anemia    Pneumonia 1995   /w PE,    Pulmonary embolism ([place])    previous   Renal cyst    Sleep apnea    pt. denies sleep apnea, pt. states he has had 2 studies - last one 2011   Spinal stenosis    Assessment: Patient is a 81yo male presenting with intermittent chest pain. Pharmacy consulted for Heparin dosing for ACS/STEMI.  Patient was taking Apixaban prior to admission and did have a dose this morning.   Date Time HL/aPTT Rate/Comment  7/17 1506 >1.10  Baseline 7/17 2252 aPTT 75s Therapeutic x 1  Goal of Therapy:  Heparin level 0.3-0.7 units/ml aPTT 66 - 102 seconds Monitor platelets by anticoagulation protocol: Yes   Plan:  Heparin  therapeutic x 1 (drawn 2 hours early) Continue heparin at  1200 units/hr Re-check aPTT 8 hours  Will check daily CBC while on Heparin infusion Will check HL at least daily until correlation with aPTT then will switch to HL for monitoring  Dorothe Pea, PharmD, BCPS Clinical Pharmacist   03/22/2022 11:20 PM

## 2022-03-22 NOTE — Progress Notes (Signed)
I discussed the case with Dr. Virgina Jock from gastroenterology.  The patient has significant anemia and thrombocytopenia.  He needs GI evaluation with an EGD and colonoscopy.  We discussed the timing of this.  I favor that his anemia work-up is performed before proceeding with cardiac catheterization.  We will obtain an echocardiogram and as long as that does not show significant changes from last year, the patient can proceed with endoscopic GI evaluation from a cardiac standpoint even though he is at moderate risk.  Cardiac catheterization 7 months ago showed stable findings overall with chronically occluded right coronary artery with left-to-right collaterals, patent LAD stent and moderate left circumflex stenosis.  This was stable when compared to previous cardiac catheterization in 2021.

## 2022-03-22 NOTE — Consult Note (Addendum)
Consultation  Referring Provider:     Dr Blaine Hamper Admit date 03/22/16 Consult date     03/22/16    Reason for Consultation:     anemia exacerbation         HPI:   Cody Price is a 81 y.o. male coronary artery disease status post multiple PCI's, chronic diastolic heart failure, permanent atrial fibrillation on Eliquis, prior PUD, type 2 diabetes, previous tobacco use, essential hypertension and hyperlipidemia who presented to ED today with chest pain, suspected to have NSTEMI- has been seen by cardiology with tentative plans for right and left caridac cath Wednesday. His eliquis has been stopped and he has been heparinized. It is also noted that his hemoglobin has dropped to 8 from 11.9 and GI has been consulted for this and dark stools.Note his BUn and creatine are normal today. His ferritin is normal, iron low, folate normal. He has chronic thrombocytopenia of unknown etiology for a nurmber of years. Had eval at [facility] some years ago. He denies any history of liver disease. Liver enzymes normal wth exception of mild fluctuations of bilirubin, and no evidence of liver abnormalities on prior irmaging. He does have some stable splenomegaly. Patient reports he has been doing fairly well in his abdomen- states he takes prune juice qod and this helps move his bowels. Describes them as dark brown. He denies any abdominal pain, NVD, dysphagia, hematochezia. Notes he has lost some weight over the last year or so. Denies any nsaids. Last dose of eliquis today. States his main concern is the extreme DOE/fatigue and chest pain has has been having over the last 3d. He is to receive PRBCs today and has been started on oral PPI  PREVIOUS ENDOSCOPIES:            Colonoscopy 07/11/19- Dr Alice Reichert- diverticulosis and 2 small polyps- one adenomatous and on was benign colonic tissue. EGD 07/11/19-Dr Toledo- irregular zline, gastritis, single gastric polyp, normal duodenum. There was no barretts, h pylori, dysplasia, metaplasia  or malignancy  EGD and colonoscopy 2015. EGD gastritis. There was no H pylori, Barretts, dysplasia/malignancy Colonoscopy adenomatous polyps, diverticulosis, hemorrhoids  CT A/P 9/20-IMPRESSION: 1. No explanation for abdominal pain. 2. Stable splenomegaly. 3.  Aortic Atherosclerosis (ICD10-I70.0). 4. Posterior lumbar fusion and decompression without acute findings.    Past Medical History:  Diagnosis Date   Arthritis    degenerative back    BPH with obstruction/lower urinary tract symptoms    CAD (coronary artery disease) 2010   stent   Diabetes mellitus    ED (erectile dysfunction)    Elevated PSA    GERD (gastroesophageal reflux disease)    Gross hematuria    H/O CT scan    recent renal CT due to hematuria, cystoscopy - normal     Hematuria    History of hiatal hernia    History of kidney stones    HLD (hyperlipidemia)    Hypertension    Ischemic heart disease 1991   with angioplasty   Kidney stone    Morbid obesity ([place])    Myocardial infarction ([place])    Peripheral neuropathy    Pernicious anemia    Pneumonia 1995   /w PE,    Pulmonary embolism ([place])    previous   Renal cyst    Sleep apnea    pt. denies sleep apnea, pt. states he has had 2 studies - last one 2011   Spinal stenosis     Past Surgical History:  Procedure Laterality Date  APPENDECTOMY     BACK SURGERY     x3 back surgeries - /w fusioin, 1- [place] 2- [facility]   bilateral inguinal hernia repair     cad     stent 2010   CARDIAC CATHETERIZATION  08/17/2014   CARPAL TUNNEL RELEASE  2014   bilateral    CHOLECYSTECTOMY     COLONOSCOPY WITH PROPOFOL N/A 07/11/2019   Procedure: COLONOSCOPY WITH PROPOFOL;  Surgeon: Toledo, Benay Pike, MD;  Location: [facility] ENDOSCOPY;  Service: Gastroenterology;  Laterality: N/A;   CORONARY BALLOON ANGIOPLASTY N/A 07/11/2020   Procedure: CORONARY BALLOON ANGIOPLASTY;  Surgeon: Isaias Cowman, MD;  Location: [place] [facility];  Service: Cardiovascular;   Laterality: N/A;   CORONARY STENT INTERVENTION N/A 06/07/2018   Procedure: CORONARY STENT INTERVENTION;  Surgeon: Nelva Bush, MD;  Location: [place] [facility];  Service: Cardiovascular;  Laterality: N/A;   CORONARY STENT INTERVENTION N/A 07/11/2020   Procedure: CORONARY STENT INTERVENTION;  Surgeon: Isaias Cowman, MD;  Location: [place] [facility];  Service: Cardiovascular;  Laterality: N/A;   ESOPHAGOGASTRODUODENOSCOPY (EGD) WITH PROPOFOL N/A 07/11/2019   Procedure: ESOPHAGOGASTRODUODENOSCOPY (EGD) WITH PROPOFOL;  Surgeon: Toledo, Benay Pike, MD;  Location: [facility] ENDOSCOPY;  Service: Gastroenterology;  Laterality: N/A;   LAMINECTOMY     LEFT HEART CATH AND CORONARY ANGIOGRAPHY N/A 06/07/2018   Procedure: LEFT HEART CATH AND CORONARY ANGIOGRAPHY;  Surgeon: Nelva Bush, MD;  Location: [place] [facility];  Service: Cardiovascular;  Laterality: N/A;   LEFT HEART CATH AND CORONARY ANGIOGRAPHY N/A 08/26/2021   Procedure: LEFT HEART CATH AND CORONARY ANGIOGRAPHY;  Surgeon: Wellington Hampshire, MD;  Location: [place] [facility];  Service: Cardiovascular;  Laterality: N/A;   RIGHT/LEFT HEART CATH AND CORONARY ANGIOGRAPHY N/A 07/11/2020   Procedure: RIGHT/LEFT HEART CATH AND CORONARY ANGIOGRAPHY;  Surgeon: Minna Merritts, MD;  Location: [place] [facility];  Service: Cardiovascular;  Laterality: N/A;    Family History  Problem Relation Age of Onset   Heart failure Mother    Heart disease Father    Heart attack Brother    Skin cancer Sister    Kidney disease Neg Hx    Prostate cancer Neg Hx    Kidney cancer Neg Hx    Bladder Cancer Neg Hx      Social History   Tobacco Use   Smoking status: Former    Packs/day: 3.00    Years: 15.00    Total pack years: 45.00    Types: Cigarettes    Quit date: 10/18/1985    Years since quitting: 36.4   Smokeless tobacco: Former   Tobacco comments:    tobacco use- no   Vaping Use   Vaping Use: Never used  Substance Use Topics    Alcohol use: Not Currently    Comment: rare   Drug use: No    Prior to Admission medications   Medication Sig Start Date End Date Taking? Authorizing Provider  apixaban (ELIQUIS) 5 MG TABS tablet TAKE 1 TABLET BY MOUTH TWICE A DAY 10/22/21  Yes Gollan, Kathlene November, MD  atorvastatin (LIPITOR) 40 MG tablet TAKE 1 TABLET BY MOUTH DAILY AT 6 PM. 04/23/21  Yes Gollan, Kathlene November, MD  Coenzyme Q10 (CO Q-10) 200 MG CAPS Take 200 mg by mouth daily.   Yes [provider]  cyanocobalamin (,VITAMIN B-12,) 1000 MCG/ML injection Inject 1,000 mcg into the muscle every 30 (thirty) days. 09/12/19  Yes [provider]  diazepam (VALIUM) 5 MG tablet Take 5 mg  by mouth 3 (three) times daily as needed for anxiety or muscle spasms.   Yes [provider]  finasteride (PROSCAR) 5 MG tablet TAKE 1 TABLET BY MOUTH EVERY DAY 12/01/20  Yes McGowan, Larene Beach A, PA-C  furosemide (LASIX) 40 MG tablet Take 1 tablet (40 mg total) by mouth 2 (two) times daily. 08/29/21 03/22/22 Yes Sreenath, Sudheer B, MD  glimepiride (AMARYL) 4 MG tablet Take 4 mg by mouth 2 (two) times daily.   Yes [provider]  KLOR-CON M20 20 MEQ tablet Take 20 mEq by mouth daily. 12/31/21  Yes [provider]  lisinopril (ZESTRIL) 10 MG tablet Take 10 mg by mouth daily. 01/08/22  Yes [provider]  methocarbamol (ROBAXIN) 500 MG tablet Take 1 tablet (500 mg total) by mouth every 8 (eight) hours as needed for muscle spasms. 09/29/21  Yes Carrie Mew, MD  metoprolol succinate (TOPROL-XL) 50 MG 24 hr tablet TAKE 1 TABLET BY MOUTH EVERY DAY WITH OR IMMEDIATELY FOLLOWING A MEAL 02/05/22  Yes Gollan, Kathlene November, MD  Multiple Vitamin (MULTIVITAMIN WITH MINERALS) TABS tablet Take 1 tablet by mouth daily. 06/08/18  Yes Wieting, Richard, MD  omega-3 acid ethyl esters (LOVAZA) 1 g capsule Take 2 g by mouth daily.   Yes [provider]  omeprazole (PRILOSEC) 40 MG capsule Take 40 mg by mouth 2 (two) times daily.  06/07/19  Yes [provider]  oxyCODONE (OXY IR/ROXICODONE) 5 MG immediate release tablet Take 5 mg by mouth 4 (four) times daily as needed. 03/22/22  Yes [provider]  oxyCODONE-acetaminophen (PERCOCET) 10-325 MG tablet Take 1 tablet by mouth every 4 (four) hours as needed for pain.    Yes [provider]  potassium chloride (KLOR-CON) 10 MEQ tablet Take 10 mEq by mouth daily. 07/17/21 07/17/22 Yes [provider]  sucralfate (CARAFATE) 1 g tablet Take 1 g by mouth 4 (four) times daily. 03/13/22  Yes [provider]  tamsulosin (FLOMAX) 0.4 MG CAPS capsule TAKE 1 CAPSULE BY MOUTH EVERY DAY 12/01/20  Yes McGowan, Larene Beach A, PA-C  testosterone cypionate (DEPOTESTOSTERONE CYPIONATE) 200 MG/ML injection Inject 200 mg into the muscle every 28 (twenty-eight) days.   Yes [provider]  acetaminophen (TYLENOL) 500 MG tablet Take 500 mg by mouth as needed.     [provider]  aspirin EC 81 MG EC tablet Take 1 tablet (81 mg total) by mouth daily. Swallow whole. Patient not taking: Reported on 03/22/2022 08/30/21   Sidney Ace, MD  fluticasone [facility]) 50 MCG/ACT nasal spray Place 2 sprays into both nostrils daily. 08/29/21 11/27/21  [place] Donnamarie Poag, DO  lisinopril (ZESTRIL) 5 MG tablet Take 1 tablet (5 mg total) by mouth daily. Patient not taking: Reported on 03/22/2022 04/21/21   Minna Merritts, MD  Oxycodone HCl 10 MG TABS Take 10 mg by mouth every 4 (four) hours as needed. Patient not taking: Reported on 03/22/2022 02/05/22   [provider]  sildenafil (VIAGRA) 100 MG tablet Take 100 mg by mouth daily as needed for erectile dysfunction.    [provider]    Current Facility-Administered Medications  Medication Dose Route Frequency Provider Last Rate Last Admin   0.9 %  sodium chloride infusion (Manually program via Guardrails IV Fluids)   Intravenous Once Ivor Costa, MD       acetaminophen (TYLENOL) tablet 650 mg   650 mg Oral Q6H PRN Ivor Costa, MD       albuterol (PROVENTIL) (2.5 MG/3ML) 0.083%  nebulizer solution 3 mL  3 mL Inhalation Q4H PRN Ivor Costa, MD       aspirin EC tablet 81 mg  81 mg Oral Daily Ivor Costa, MD       atorvastatin (LIPITOR) tablet 40 mg  40 mg Oral Daily Ivor Costa, MD       cyanocobalamin ((VITAMIN B-12)) injection 1,000 mcg  1,000 mcg Intramuscular Q30 days Ivor Costa, MD       dextromethorphan-guaiFENesin [facility] DM) 30-600 MG per 12 hr tablet 1 tablet  1 tablet Oral BID PRN Ivor Costa, MD       diazepam (VALIUM) tablet 5 mg  5 mg Oral TID PRN Ivor Costa, MD       [START ON 03/23/2022] finasteride (PROSCAR) tablet 5 mg  5 mg Oral Daily Ivor Costa, MD       furosemide (LASIX) injection 40 mg  40 mg Intravenous Once Ivor Costa, MD       [START ON 03/23/2022] furosemide (LASIX) tablet 40 mg  40 mg Oral BID Ivor Costa, MD       heparin ADULT infusion 100 units/mL (25000 units/233m)  1,200 Units/hr Intravenous Continuous KVira Blanco RPH       hydrALAZINE (APRESOLINE) injection 5 mg  5 mg Intravenous Q2H PRN NIvor Costa MD       insulin aspart (novoLOG) injection 0-5 Units  0-5 Units Subcutaneous QHS NIvor Costa MD       insulin aspart (novoLOG) injection 0-9 Units  0-9 Units Subcutaneous TID WC NIvor Costa MD       [START ON 03/23/2022] lisinopril (ZESTRIL) tablet 10 mg  10 mg Oral Daily NIvor Costa MD       methocarbamol (ROBAXIN) tablet 500 mg  500 mg Oral Q8H PRN NIvor Costa MD       [START ON 03/23/2022] metoprolol succinate (TOPROL-XL) 24 hr tablet 50 mg  50 mg Oral Daily NIvor Costa MD       morphine (PF) 2 MG/ML injection 2 mg  2 mg Intravenous Q4H PRN NIvor Costa MD       [START ON 03/23/2022] multivitamin with minerals tablet 1 tablet  1 tablet Oral Daily NIvor Costa MD       [START ON 03/23/2022] omega-3 acid ethyl esters (LOVAZA) capsule 2 g  2 g Oral Daily NIvor Costa MD       ondansetron ([facility] injection 4 mg  4 mg Intravenous Q8H PRN NIvor Costa MD        oxyCODONE-acetaminophen (PERCOCET) 10-325 MG per tablet 1 tablet  1 tablet Oral Q4H PRN NIvor Costa MD       pantoprazole (PROTONIX) EC tablet 40 mg  40 mg Oral BID NIvor Costa MD       sucralfate (CARAFATE) tablet 1 g  1 g Oral QID NIvor Costa MD       [START ON 03/23/2022] tamsulosin (FLOMAX) capsule 0.4 mg  0.4 mg Oral Daily NIvor Costa MD       Current Outpatient Medications  Medication Sig Dispense Refill   apixaban (ELIQUIS) 5 MG TABS tablet TAKE 1 TABLET BY MOUTH TWICE A DAY 60 tablet 5   atorvastatin (LIPITOR) 40 MG tablet TAKE 1 TABLET BY MOUTH DAILY AT 6 PM. 90 tablet 3   Coenzyme Q10 (CO Q-10) 200 MG CAPS Take 200 mg by mouth daily.     cyanocobalamin (,VITAMIN B-12,) 1000 MCG/ML injection Inject 1,000 mcg into the muscle every 30 (thirty) days.     diazepam (VALIUM) 5  MG tablet Take 5 mg by mouth 3 (three) times daily as needed for anxiety or muscle spasms.     finasteride (PROSCAR) 5 MG tablet TAKE 1 TABLET BY MOUTH EVERY DAY 90 tablet 3   furosemide (LASIX) 40 MG tablet Take 1 tablet (40 mg total) by mouth 2 (two) times daily. 60 tablet 0   glimepiride (AMARYL) 4 MG tablet Take 4 mg by mouth 2 (two) times daily.     KLOR-CON M20 20 MEQ tablet Take 20 mEq by mouth daily.     lisinopril (ZESTRIL) 10 MG tablet Take 10 mg by mouth daily.     methocarbamol (ROBAXIN) 500 MG tablet Take 1 tablet (500 mg total) by mouth every 8 (eight) hours as needed for muscle spasms. 15 tablet 0   metoprolol succinate (TOPROL-XL) 50 MG 24 hr tablet TAKE 1 TABLET BY MOUTH EVERY DAY WITH OR IMMEDIATELY FOLLOWING A MEAL 90 tablet 0   Multiple Vitamin (MULTIVITAMIN WITH MINERALS) TABS tablet Take 1 tablet by mouth daily. 30 tablet 0   omega-3 acid ethyl esters (LOVAZA) 1 g capsule Take 2 g by mouth daily.     omeprazole (PRILOSEC) 40 MG capsule Take 40 mg by mouth 2 (two) times daily.     oxyCODONE (OXY IR/ROXICODONE) 5 MG immediate release tablet Take 5 mg by mouth 4 (four) times daily as needed.      oxyCODONE-acetaminophen (PERCOCET) 10-325 MG tablet Take 1 tablet by mouth every 4 (four) hours as needed for pain.      potassium chloride (KLOR-CON) 10 MEQ tablet Take 10 mEq by mouth daily.     sucralfate (CARAFATE) 1 g tablet Take 1 g by mouth 4 (four) times daily.     tamsulosin (FLOMAX) 0.4 MG CAPS capsule TAKE 1 CAPSULE BY MOUTH EVERY DAY 90 capsule 3   testosterone cypionate (DEPOTESTOSTERONE CYPIONATE) 200 MG/ML injection Inject 200 mg into the muscle every 28 (twenty-eight) days.     acetaminophen (TYLENOL) 500 MG tablet Take 500 mg by mouth as needed.      aspirin EC 81 MG EC tablet Take 1 tablet (81 mg total) by mouth daily. Swallow whole. (Patient not taking: Reported on 03/22/2022) 30 tablet 11   fluticasone (FLONASE) 50 MCG/ACT nasal spray Place 2 sprays into both nostrils daily. 9.9 mL 3   lisinopril (ZESTRIL) 5 MG tablet Take 1 tablet (5 mg total) by mouth daily. (Patient not taking: Reported on 03/22/2022) 90 tablet 3   Oxycodone HCl 10 MG TABS Take 10 mg by mouth every 4 (four) hours as needed. (Patient not taking: Reported on 03/22/2022)     sildenafil (VIAGRA) 100 MG tablet Take 100 mg by mouth daily as needed for erectile dysfunction.      Allergies as of 03/22/2022 - Review Complete 03/22/2022  Allergen Reaction Noted   Novocain [procaine] Other (See Comments) 09/25/2015   Procaine hcl Other (See Comments) 05/28/2010   Lyrica [pregabalin]  09/25/2015   Neurontin [gabapentin]  09/25/2015   Amitriptyline Other (See Comments) 09/25/2015   Dicyclomine Other (See Comments) 06/01/2019   Metformin Nausea Only 11/28/2013   Nitroglycerin Other (See Comments) 05/28/2010     Review of Systems:    All systems reviewed and negative except where noted in HPI with exception of chronic neck and back pain.     Physical Exam:  Vital signs in last 24 hours: Temp:  [97.7 F (36.5 C)-98.9 F (37.2 C)] 98.9 F (37.2 C) (07/17 1443) Pulse Rate:  [84-155] 84 (07/17  1500) Resp:   [15-20] 20 (07/17 1500) BP: (101-121)/(46-59) 101/56 (07/17 1500) SpO2:  [100 %] 100 % (07/17 1500) Weight:  [106.6 kg] 106.6 kg (07/17 1443)   General:   Pleasant man in NAD Head:  Normocephalic and atraumatic. Eyes:   No icterus.   Conjunctiva pink. Ears:  Normal auditory acuity. Mouth: Mucosa pink moist, no lesions. Neck:  Supple; no masses felt Lungs:  Respirations even and unlabored. There is some mild DOE on position changes.Lungs clear to auscultation bilaterally.   No wheezes, crackles, or rhonchi.  Heart:  S1S2, RRR, no MRG. No edema. Abdomen:   protuberant, soft, nondistended, nontender. Normal bowel sounds. No appreciable masses or hepatomegaly. No rebound signs or other peritoneal signs. Rectal:  Stool medium brown. No rectal abnormalities Msk:  MAEW x4, No clubbing or cyanosis. Strength 5/5. Symmetrical without gross deformities. Neurologic:  Alert and  oriented x4;  Cranial nerves II-XII intact.  Skin:  Warm, dry, pink without significant lesions or rashes. Telangiectasias  Psych:  Alert and cooperative. Normal affect.  LAB RESULTS: Recent Labs    03/22/22 1021  WBC 9.4  HGB 8.1*  HCT 24.6*  PLT 64*   BMET Recent Labs    03/22/22 1021  NA 135  K 4.5  CL 106  CO2 21*  GLUCOSE 249*  BUN 21  CREATININE 1.15  CALCIUM 8.5*   LFT No results for input(s): "PROT", "ALBUMIN", "AST", "ALT", "ALKPHOS", "BILITOT", "BILIDIR", "IBILI" in the last 72 hours. PT/INR No results for input(s): "LABPROT", "INR" in the last 72 hours.  STUDIES: DG Chest 2 View  Result Date: 03/22/2022 CLINICAL DATA:  Chest pain. EXAM: CHEST - 2 VIEW COMPARISON:  August 21, 2021. FINDINGS: Stable cardiomediastinal silhouette. No acute pulmonary disease is noted. Bony thorax is unremarkable. IMPRESSION: No active cardiopulmonary disease. Electronically Signed   By: Marijo Conception M.D.   On: 03/22/2022 10:40       Impression / Plan:   Acute on chronic anemia- no definite GI loss,  considering egd as cliniccally feasible. Given his history of chronic anemia/thrombocytopenia/splenomegaly, recommend hematology consult. He states he has not been following with hematology. Will changes his PPI to bid IV. Agree with transfusion and serial hemoglobins. Further recommendations to follow  Thank you very much for this consult. These services were provided by Stephens November, NP-C, in collaboration with Volney American, DO, with whom I have discussed this patient in full.   Stephens November, NP-C

## 2022-03-22 NOTE — Consult Note (Signed)
Cardiology Consultation:   Patient ID: Cody Price MRN: [id number]; DOB: Jan 03, 1941  Admit date: 03/22/2022 Date of Consult: 03/22/2022  PCP:  Baxter Hire, MD   [facility] Providers Cardiologist:  Ida Rogue, MD     Patient Profile:   Cody Price is a 81 y.o. male with a hx of coronary artery disease status post multiple PCI's, chronic diastolic heart failure, permanent atrial fibrillation, type 2 diabetes, previous tobacco use, essential hypertension and hyperlipidemia who is being seen 03/22/2022 for the evaluation of chest pain with possible unstable angina at the request of Dr. Blaine Hamper.  History of Present Illness:   Cody Price is an 81 year old male with extensive cardiac history.  He is a patient of Dr. Rockey Situ.  He had multiple PCI's in the past.  He had initially stent placement to the right coronary artery that was found to be later occluded with collaterals.  In addition, he had an LAD stent.  He has chronic atrial fibrillation being treated with rate control and anticoagulation.  In addition, he has known history of chronic anemia and thrombocytopenia followed by hematology. He was hospitalized in December 2022 with COVID infection and non-ST elevation myocardial infarction.  He underwent cardiac catheterization by me during that admission which showed an EF of 50 to 55%, patent LAD stent with no significant restenosis and chronically occluded right coronary artery with left-to-right collaterals and moderate left circumflex disease.  Overall, there was no significant change in coronary anatomy since previous cardiac catheterization and the patient was treated medically.  Troponin elevation was felt to be due to supply demand ischemia.  He takes furosemide 40 mg twice daily. He presents to the ED with worsening exertional dyspnea, fatigue and exertional dizziness.  He has minimal chest discomfort but reports his current symptoms are very similar to previous myocardial  infarction.  His initial troponin was 69 and subsequent 1 was 72.  Last dose of Eliquis was this morning.   Past Medical History:  Diagnosis Date   Arthritis    degenerative back    BPH with obstruction/lower urinary tract symptoms    CAD (coronary artery disease) 2010   stent   Diabetes mellitus    ED (erectile dysfunction)    Elevated PSA    GERD (gastroesophageal reflux disease)    Gross hematuria    H/O CT scan    recent renal CT due to hematuria, cystoscopy - normal     Hematuria    History of hiatal hernia    History of kidney stones    HLD (hyperlipidemia)    Hypertension    Ischemic heart disease 1991   with angioplasty   Kidney stone    Morbid obesity ([place])    Myocardial infarction ([place])    Peripheral neuropathy    Pernicious anemia    Pneumonia 1995   /w PE,    Pulmonary embolism ([place])    previous   Renal cyst    Sleep apnea    pt. denies sleep apnea, pt. states he has had 2 studies - last one 2011   Spinal stenosis     Past Surgical History:  Procedure Laterality Date   APPENDECTOMY     BACK SURGERY     x3 back surgeries - /w fusioin, 1- [place] 2- [facility]   bilateral inguinal hernia repair     cad     stent 2010   CARDIAC CATHETERIZATION  08/17/2014   CARPAL TUNNEL RELEASE  2014   bilateral  CHOLECYSTECTOMY     COLONOSCOPY WITH PROPOFOL N/A 07/11/2019   Procedure: COLONOSCOPY WITH PROPOFOL;  Surgeon: Toledo, Benay Pike, MD;  Location: [facility] ENDOSCOPY;  Service: Gastroenterology;  Laterality: N/A;   CORONARY BALLOON ANGIOPLASTY N/A 07/11/2020   Procedure: CORONARY BALLOON ANGIOPLASTY;  Surgeon: Isaias Cowman, MD;  Location: [place] CV LAB;  Service: Cardiovascular;  Laterality: N/A;   CORONARY STENT INTERVENTION N/A 06/07/2018   Procedure: CORONARY STENT INTERVENTION;  Surgeon: Nelva Bush, MD;  Location: [place] CV LAB;  Service: Cardiovascular;  Laterality: N/A;   CORONARY STENT INTERVENTION N/A 07/11/2020   Procedure:  CORONARY STENT INTERVENTION;  Surgeon: Isaias Cowman, MD;  Location: [place] CV LAB;  Service: Cardiovascular;  Laterality: N/A;   ESOPHAGOGASTRODUODENOSCOPY (EGD) WITH PROPOFOL N/A 07/11/2019   Procedure: ESOPHAGOGASTRODUODENOSCOPY (EGD) WITH PROPOFOL;  Surgeon: Toledo, Benay Pike, MD;  Location: [facility] ENDOSCOPY;  Service: Gastroenterology;  Laterality: N/A;   LAMINECTOMY     LEFT HEART CATH AND CORONARY ANGIOGRAPHY N/A 06/07/2018   Procedure: LEFT HEART CATH AND CORONARY ANGIOGRAPHY;  Surgeon: Nelva Bush, MD;  Location: [place] CV LAB;  Service: Cardiovascular;  Laterality: N/A;   LEFT HEART CATH AND CORONARY ANGIOGRAPHY N/A 08/26/2021   Procedure: LEFT HEART CATH AND CORONARY ANGIOGRAPHY;  Surgeon: Wellington Hampshire, MD;  Location: [place] CV LAB;  Service: Cardiovascular;  Laterality: N/A;   RIGHT/LEFT HEART CATH AND CORONARY ANGIOGRAPHY N/A 07/11/2020   Procedure: RIGHT/LEFT HEART CATH AND CORONARY ANGIOGRAPHY;  Surgeon: Minna Merritts, MD;  Location: [place] [facility];  Service: Cardiovascular;  Laterality: N/A;     Home Medications:  Prior to Admission medications   Medication Sig Start Date End Date Taking? Authorizing Provider  apixaban (ELIQUIS) 5 MG TABS tablet TAKE 1 TABLET BY MOUTH TWICE A DAY 10/22/21  Yes Gollan, Kathlene November, MD  atorvastatin (LIPITOR) 40 MG tablet TAKE 1 TABLET BY MOUTH DAILY AT 6 PM. 04/23/21  Yes Gollan, Kathlene November, MD  Coenzyme Q10 (CO Q-10) 200 MG CAPS Take 200 mg by mouth daily.   Yes [provider]  cyanocobalamin (,VITAMIN B-12,) 1000 MCG/ML injection Inject 1,000 mcg into the muscle every 30 (thirty) days. 09/12/19  Yes [provider]  diazepam (VALIUM) 5 MG tablet Take 5 mg by mouth 3 (three) times daily as needed for anxiety or muscle spasms.   Yes [provider]  finasteride (PROSCAR) 5 MG tablet TAKE 1 TABLET BY MOUTH EVERY DAY 12/01/20  Yes McGowan, Larene Beach A, PA-C  furosemide (LASIX) 40 MG tablet  Take 1 tablet (40 mg total) by mouth 2 (two) times daily. 08/29/21 03/22/22 Yes Sreenath, Sudheer B, MD  glimepiride (AMARYL) 4 MG tablet Take 4 mg by mouth 2 (two) times daily.   Yes [provider]  KLOR-CON M20 20 MEQ tablet Take 20 mEq by mouth daily. 12/31/21  Yes [provider]  lisinopril (ZESTRIL) 10 MG tablet Take 10 mg by mouth daily. 01/08/22  Yes [provider]  methocarbamol (ROBAXIN) 500 MG tablet Take 1 tablet (500 mg total) by mouth every 8 (eight) hours as needed for muscle spasms. 09/29/21  Yes Carrie Mew, MD  metoprolol succinate (TOPROL-XL) 50 MG 24 hr tablet TAKE 1 TABLET BY MOUTH EVERY DAY WITH OR IMMEDIATELY FOLLOWING A MEAL 02/05/22  Yes Gollan, Kathlene November, MD  Multiple Vitamin (MULTIVITAMIN WITH MINERALS) TABS tablet Take 1 tablet by mouth daily. 06/08/18  Yes Wieting, Richard, MD  omega-3 acid ethyl esters (LOVAZA) 1 g capsule Take 2 g by  mouth daily.   Yes [provider]  omeprazole (PRILOSEC) 40 MG capsule Take 40 mg by mouth 2 (two) times daily. 06/07/19  Yes [provider]  oxyCODONE (OXY IR/ROXICODONE) 5 MG immediate release tablet Take 5 mg by mouth 4 (four) times daily as needed. 03/22/22  Yes [provider]  oxyCODONE-acetaminophen (PERCOCET) 10-325 MG tablet Take 1 tablet by mouth every 4 (four) hours as needed for pain.    Yes [provider]  potassium chloride (KLOR-CON) 10 MEQ tablet Take 10 mEq by mouth daily. 07/17/21 07/17/22 Yes [provider]  sucralfate (CARAFATE) 1 g tablet Take 1 g by mouth 4 (four) times daily. 03/13/22  Yes [provider]  tamsulosin (FLOMAX) 0.4 MG CAPS capsule TAKE 1 CAPSULE BY MOUTH EVERY DAY 12/01/20  Yes McGowan, Larene Beach A, PA-C  testosterone cypionate (DEPOTESTOSTERONE CYPIONATE) 200 MG/ML injection Inject 200 mg into the muscle every 28 (twenty-eight) days.   Yes [provider]  acetaminophen (TYLENOL) 500 MG tablet Take 500 mg by mouth as  needed.     [provider]  aspirin EC 81 MG EC tablet Take 1 tablet (81 mg total) by mouth daily. Swallow whole. Patient not taking: Reported on 03/22/2022 08/30/21   Sidney Ace, MD  fluticasone [facility]) 50 MCG/ACT nasal spray Place 2 sprays into both nostrils daily. 08/29/21 11/27/21  [place] Donnamarie Poag, DO  lisinopril (ZESTRIL) 5 MG tablet Take 1 tablet (5 mg total) by mouth daily. Patient not taking: Reported on 03/22/2022 04/21/21   Minna Merritts, MD  Oxycodone HCl 10 MG TABS Take 10 mg by mouth every 4 (four) hours as needed. Patient not taking: Reported on 03/22/2022 02/05/22   [provider]  sildenafil (VIAGRA) 100 MG tablet Take 100 mg by mouth daily as needed for erectile dysfunction.    [provider]    Inpatient Medications: Scheduled Meds:  aspirin EC  81 mg Oral Daily   atorvastatin  40 mg Oral Daily   cyanocobalamin  1,000 mcg Intramuscular Q30 days   [START ON 03/23/2022] finasteride  5 mg Oral Daily   furosemide  40 mg Intravenous Once   [START ON 03/23/2022] furosemide  40 mg Oral BID   insulin aspart  0-5 Units Subcutaneous QHS   insulin aspart  0-9 Units Subcutaneous TID WC   [START ON 03/23/2022] lisinopril  10 mg Oral Daily   [START ON 03/23/2022] metoprolol succinate  50 mg Oral Daily   [START ON 03/23/2022] multivitamin with minerals  1 tablet Oral Daily   [START ON 03/23/2022] omega-3 acid ethyl esters  2 g Oral Daily   pantoprazole  40 mg Oral BID   sucralfate  1 g Oral QID   [START ON 03/23/2022] tamsulosin  0.4 mg Oral Daily   Continuous Infusions:  heparin     PRN Meds: acetaminophen, albuterol, dextromethorphan-guaiFENesin, diazepam, hydrALAZINE, methocarbamol, morphine injection, ondansetron (ZOFRAN) IV, oxyCODONE-acetaminophen  Allergies:    Allergies  Allergen Reactions   Novocain [Procaine] Other (See Comments)    Syncope   Procaine Hcl Other (See Comments)    Passes out   Lyrica [Pregabalin]    Neurontin  [Gabapentin]    Amitriptyline Other (See Comments)    Sleepy   Dicyclomine Other (See Comments)    Other reaction(s): Abdominal Pain   Metformin Nausea Only   Nitroglycerin Other (See Comments)    Makes Blood pressure go to low.     Social History:   Social History   Socioeconomic  History   Marital status: Married    Spouse name: Not on file   Number of children: Not on file   Years of education: Not on file   Highest education level: Not on file  Occupational History   Not on file  Tobacco Use   Smoking status: Former    Packs/day: 3.00    Years: 15.00    Total pack years: 45.00    Types: Cigarettes    Quit date: 10/18/1985    Years since quitting: 36.4   Smokeless tobacco: Former   Tobacco comments:    tobacco use- no   Vaping Use   Vaping Use: Never used  Substance and Sexual Activity   Alcohol use: Not Currently    Comment: rare   Drug use: No   Sexual activity: Not on file  Other Topics Concern   Not on file  Social History Narrative   Walks regularly. Retired.    Social Determinants of Health   Financial Resource Strain: Not on file  Food Insecurity: Not on file  Transportation Needs: Not on file  Physical Activity: Not on file  Stress: Not on file  Social Connections: Not on file  Intimate Partner Violence: Not on file    Family History:    Family History  Problem Relation Age of Onset   Heart failure Mother    Heart disease Father    Heart attack Brother    Skin cancer Sister    Kidney disease Neg Hx    Prostate cancer Neg Hx    Kidney cancer Neg Hx    Bladder Cancer Neg Hx      ROS:  Please see the history of present illness.   All other ROS reviewed and negative.     Physical Exam/Data:   Vitals:   03/22/22 1220 03/22/22 1400 03/22/22 1443 03/22/22 1500  BP: (!) 109/56 (!) 106/59  (!) 101/56  Pulse:  (!) 155  84  Resp: '16 15  20  '$ Temp:   98.9 F (37.2 C)   TempSrc:   Oral   SpO2: 100% 100%  100%  Weight:   106.6 kg    No  intake or output data in the 24 hours ending 03/22/22 1548    03/22/2022    2:43 PM 09/29/2021   11:01 AM 09/28/2021   10:36 AM  Last 3 Weights  Weight (lbs) 235 lb 233 lb 233 lb 6 oz  Weight (kg) 106.595 kg 105.688 kg 105.858 kg     Body mass index is 30.17 kg/m.  General:  Well nourished, well developed, in no acute distress HEENT: normal Neck: Mild JVD Vascular: No carotid bruits; Distal pulses 2+ bilaterally Cardiac:  normal S1, S2; irregularly irregular with no murmurs. Lungs:  clear to auscultation bilaterally, no wheezing, rhonchi or rales  Abd: soft, nontender, no hepatomegaly  Ext: Mild bilateral leg edema Musculoskeletal:  No deformities, BUE and BLE strength normal and equal Skin: warm and dry  Neuro:  CNs 2-12 intact, no focal abnormalities noted Psych:  Normal affect   EKG:  The EKG was personally reviewed and demonstrates: Atrial fibrillation with poor R wave progression in the precordial leads. Telemetry:  Telemetry was personally reviewed and demonstrates: Atrial fibrillation  Relevant CV Studies:  Most recent echocardiogram in December 2022 showed an EF of 50 to 55% with moderate mitral regurgitation and moderate pulmonary hypertension.  Laboratory Data:  High Sensitivity Troponin:   Recent Labs  Lab 03/22/22 1021 03/22/22 1227  TROPONINIHS  69* 72*     Chemistry Recent Labs  Lab 03/22/22 1021  NA 135  K 4.5  CL 106  CO2 21*  GLUCOSE 249*  BUN 21  CREATININE 1.15  CALCIUM 8.5*  GFRNONAA >60  ANIONGAP 8    No results for input(s): "PROT", "ALBUMIN", "AST", "ALT", "ALKPHOS", "BILITOT" in the last 168 hours. Lipids  Recent Labs  Lab 03/22/22 1025  CHOL 62  TRIG 75  HDL 20*  LDLCALC 27  CHOLHDL 3.1    Hematology Recent Labs  Lab 03/22/22 1021  WBC 9.4  RBC 2.73*  HGB 8.1*  HCT 24.6*  MCV 90.1  MCH 29.7  MCHC 32.9  RDW 18.2*  PLT 64*   Thyroid No results for input(s): "TSH", "FREET4" in the last 168 hours.  BNP Recent Labs   Lab 03/22/22 1015  BNP 431.0*    DDimer No results for input(s): "DDIMER" in the last 168 hours.   Radiology/Studies:  DG Chest 2 View  Result Date: 03/22/2022 CLINICAL DATA:  Chest pain. EXAM: CHEST - 2 VIEW COMPARISON:  August 21, 2021. FINDINGS: Stable cardiomediastinal silhouette. No acute pulmonary disease is noted. Bony thorax is unremarkable. IMPRESSION: No active cardiopulmonary disease. Electronically Signed   By: Marijo Conception M.D.   On: 03/22/2022 10:40     Assessment and Plan:   Possible small non-ST elevation myocardial infarction: The patient reports that current symptoms are very similar to prior myocardial infarction.  Troponins only mildly elevated but EKG shows new poor R wave progression in the anterior leads.  Recommends holding Eliquis and start heparin drip in anticipation of right and left cardiac catheterization on Wednesday.  However, we have to make sure that his hemoglobin and platelet counts are both stable.  The patient does have chronic history of thrombocytopenia and anemia.  I discussed with Dr. Blaine Hamper regarding the need for further work-up of this. Possible acute on chronic diastolic heart failure: He appears to be mildly volume overloaded.  I am going to repeat his echocardiogram given that most recent EF was 50 to 55% with moderate pulmonary hypertension and moderate mitral regurgitation.  Right heart catheterization will be planned if we proceed with left heart catheterization on Wednesday. Permanent atrial fibrillation: Continue ventricular rate with metoprolol.  Hold Eliquis for now and start anticoagulation with heparin. Worsening anemia: This could be contributing to some of his symptoms of fatigue.  Further work-up per hospitalist.  In addition, the patient does have chronic thrombocytopenia which seems to be stable.  Prefer that his platelet count remains above 50,000 to be able to proceed with invasive cardiac evaluation.   Risk Assessment/Risk  Scores:     TIMI Risk Score for Unstable Angina or Non-ST Elevation MI:   The patient's TIMI risk score is 5, which indicates a 26% risk of all cause mortality, new or recurrent myocardial infarction or need for urgent revascularization in the next 14 days.{   For questions or updates, please contact [place] Please consult [email or web address] for contact info under    Signed, Kathlyn Sacramento, MD  03/22/2022 3:48 PM

## 2022-03-22 NOTE — Assessment & Plan Note (Signed)
Recent A1c 6.6, well controlled.  Patient is taking Amaryl -Sliding scale insulin

## 2022-03-22 NOTE — ED Triage Notes (Signed)
Pt comes with c/o increased SOb and CP. Pt states this started 3 days ago. Pt states hx of MI. Pt states pressure and very winded when ambulating.

## 2022-03-22 NOTE — Assessment & Plan Note (Signed)
-   Continue home Valium as needed

## 2022-03-22 NOTE — ED Provider Notes (Signed)
[facility] Provider Note    Event Date/Time   First MD Initiated Contact with Patient 03/22/22 1320     (approximate)   History   Chest Pain and Shortness of Breath   HPI  Cody Price is a 81 y.o. male with a history of angina and A-fib who reports increasing chest pain and shortness of breath when he gets up and tries to move around for the last 3 days.  Last day or so he is almost passing out even when he just walks across the room to get to the toilet.  This happened here in the emergency department.      Physical Exam   Triage Vital Signs: ED Triage Vitals  Enc Vitals Group     BP 03/22/22 1010 (!) 121/46     Pulse Rate 03/22/22 1010 89     Resp 03/22/22 1010 20     Temp 03/22/22 1010 97.7 F (36.5 C)     Temp Source 03/22/22 1010 Oral     SpO2 03/22/22 1010 100 %     Weight --      Height --      Head Circumference --      Peak Flow --      Pain Score 03/22/22 1014 4     Pain Loc --      Pain Edu? --      Excl. in [place]? --     Most recent vital signs: Vitals:   03/22/22 1010 03/22/22 1220  BP: (!) 121/46 (!) 109/56  Pulse: 89   Resp: 20 16  Temp: 97.7 F (36.5 C)   SpO2: 100% 100%     General: Awake, no distress.  CV:  Good peripheral perfusion.  Resp:  Normal effort.  Some crackles in the bases otherwise clear Abd:  No distention.  Nontender Extremities: Almost imperceptible edema.   ED Results / Procedures / Treatments   Labs (all labs ordered are listed, but only abnormal results are displayed) Labs Reviewed  BASIC METABOLIC PANEL - Abnormal; Notable for the following components:      Result Value   CO2 21 (*)    Glucose, Bld 249 (*)    Calcium 8.5 (*)    All other components within normal limits  CBC - Abnormal; Notable for the following components:   RBC 2.73 (*)    Hemoglobin 8.1 (*)    HCT 24.6 (*)    RDW 18.2 (*)    Platelets 64 (*)    All other components within normal limits  TROPONIN I (HIGH  SENSITIVITY) - Abnormal; Notable for the following components:   Troponin I (High Sensitivity) 69 (*)    All other components within normal limits  TROPONIN I (HIGH SENSITIVITY) - Abnormal; Notable for the following components:   Troponin I (High Sensitivity) 72 (*)    All other components within normal limits     EKG  EKG read and interpreted by me shows A-fib at a rate of 23 normal axis no acute changes   RADIOLOGY Chest x-ray read by radiology reviewed by me shows no acute disease   PROCEDURES:  Critical Care performed: Critical care time 10 minutes this includes discussing the patient in detail with cardiology and with the hospitalist.  Additionally I spoke with the patient's son and the patient himself.  Procedures   MEDICATIONS ORDERED IN ED: Medications - No data to display   IMPRESSION / MDM / ASSESSMENT AND PLAN /  ED COURSE  I reviewed the triage vital signs and the nursing notes. Patient goes to walk to the bathroom here in the emergency department gets back and almost passed out with chest tightness and shortness of breath.  Patient with a history of heart disease symptoms most consistent with gender angina.  Does not look like CHF on the x-ray no sign of acute MI or at least no sign of STEMI.  Troponins are mildly elevated and mildly increasing but not drastically like with an NSTEMI.  There is no pleuritic pain and I do not think he has any PE.'s history certainly not consistent with that.  Patient's presentation is most consistent with acute presentation with potential threat to life or bodily function.  The patient is on the cardiac monitor to evaluate for evidence of arrhythmia and/or significant heart rate changes none have been seen except tenderness A-fib      FINAL CLINICAL IMPRESSION(S) / ED DIAGNOSES   Final diagnoses:  Near syncope  Crescendo angina ([place])     Rx / DC Orders   ED Discharge Orders     None        Note:  This document  was prepared using Dragon voice recognition software and may include unintentional dictation errors.   Nena Polio, MD 03/22/22 1622

## 2022-03-22 NOTE — Assessment & Plan Note (Signed)
-  IV hydralazine as needed -Lisinopril, metoprolol,

## 2022-03-22 NOTE — Progress Notes (Addendum)
ANTICOAGULATION CONSULT NOTE - Initial Consult  Pharmacy Consult for Heparin Drip Indication: chest pain/ACS  Allergies  Allergen Reactions   Novocain [Procaine] Other (See Comments)    Syncope   Procaine Hcl Other (See Comments)    Passes out   Lyrica [Pregabalin]    Neurontin [Gabapentin]    Amitriptyline Other (See Comments)    Sleepy   Dicyclomine Other (See Comments)    Other reaction(s): Abdominal Pain   Metformin Nausea Only   Nitroglycerin Other (See Comments)    Makes Blood pressure go to low.     Patient Measurements: Weight: 106.6 kg (235 lb) Heparin Dosing Weight: 99.9 kg  Vital Signs: Temp: 98.9 F (37.2 C) (07/17 1443) Temp Source: Oral (07/17 1443) BP: 106/59 (07/17 1400) Pulse Rate: 155 (07/17 1400)  Labs: Recent Labs    03/22/22 1021 03/22/22 1227  HGB 8.1*  --   HCT 24.6*  --   PLT 64*  --   CREATININE 1.15  --   TROPONINIHS 69* 72*    Estimated Creatinine Clearance: 66.7 mL/min (by C-G formula based on SCr of 1.15 mg/dL).   Medical History: Past Medical History:  Diagnosis Date   Arthritis    degenerative back    BPH with obstruction/lower urinary tract symptoms    CAD (coronary artery disease) 2010   stent   Diabetes mellitus    ED (erectile dysfunction)    Elevated PSA    GERD (gastroesophageal reflux disease)    Gross hematuria    H/O CT scan    recent renal CT due to hematuria, cystoscopy - normal     Hematuria    History of hiatal hernia    History of kidney stones    HLD (hyperlipidemia)    Hypertension    Ischemic heart disease 1991   with angioplasty   Kidney stone    Morbid obesity ([place])    Myocardial infarction ([place])    Peripheral neuropathy    Pernicious anemia    Pneumonia 1995   /w PE,    Pulmonary embolism ([place])    previous   Renal cyst    Sleep apnea    pt. denies sleep apnea, pt. states he has had 2 studies - last one 2011   Spinal stenosis    Assessment: Patient is a 81yo male presenting with  intermittent chest pain. Pharmacy consulted for Heparin dosing for ACS/STEMI.  Patient was taking Apixaban prior to admission and did have a dose this morning.   Goal of Therapy:  Heparin level 0.3-0.7 units/ml aPTT 66 - 102 seconds Monitor platelets by anticoagulation protocol: Yes   Plan:  No Heparin bolus since patient had apixaban prior to admission Heparin 1200 units/hr Baseline HL is > 1.10, will need to use aPTT for monitoring aPTT 8 hours after start of infusion Will check daily CBC while on Heparin infusion Will check HL at least daily until correlation with aPTT then will switch to HL for monitoring  Paulina Fusi, PharmD, BCPS 03/22/2022 3:38 PM

## 2022-03-22 NOTE — H&P (Addendum)
History and Physical    Cody Price ZWC:[id number] DOB: 03-24-1941 DOA: 03/22/2022  Referring MD/NP/PA:   PCP: Baxter Hire, MD   Patient coming from:  The patient is coming from home.  At baseline, pt is independent for most of ADL.        Chief Complaint: chest pain  HPI: Cody Price is a 81 y.o. male with medical history significant of CAD with stent, hypertension, hyperlipidemia, diabetes mellitus, atrial fibrillation and PE on Eliquis, BPH, kidney stone, thrombocytopenia, ulcer disease, dCHF, who presents with chest pain.  Patient states that he has intermittent chest pain for almost 3 days, which is located in the front chest, pressure-like, sometimes 10 out of 10 severity, currently mild, pressure-like, nonradiating, aggravated by exertion and movement.  Associated with shortness breath which is also exertional.  Patient does not have cough, fever or chills.  No nausea, vomiting, diarrhea or abdominal pain.  No symptoms of UTI.  Patient's hemoglobin dropped from 12.7 on 01/07/22 to 8.1 today. He denies rectal bleeding or bleeding from anywhere.  Patient states that he is drinking some juice which make his stool dark.  Data reviewed independently and ED Course: pt was found to have WBC 9.  4, troponin levels 69, 72, GFR> 60, temperature normal, blood pressure 109/56, heart rate 89, RR 20, oxygen saturation 100% on room air.  Chest X-.  Patient is admitted to telemetry bed as inpatient.  Dr. Fletcher Anon of card is consulted.   EKG: I have personally reviewed.  Atrial fibrillation, QTc 452, low voltage, anteroseptal infarction pattern, PVC, poor R wave progression   Review of Systems:   General: no fevers, chills, no body weight gain, has fatigue HEENT: no blurry vision, hearing changes or sore throat Respiratory: has dyspnea, coughing, no wheezing CV: has chest pain, no palpitations GI: no nausea, vomiting, abdominal pain, diarrhea, constipation GU: no dysuria, burning on  urination, increased urinary frequency, hematuria  Ext: has leg edema Neuro: no unilateral weakness, numbness, or tingling, no vision change or hearing loss Skin: no rash, no skin tear. MSK: No muscle spasm, no deformity, no limitation of range of movement in spin Heme: No easy bruising.  Travel history: No recent long distant travel.   Allergy:  Allergies  Allergen Reactions   Novocain [Procaine] Other (See Comments)    Syncope   Procaine Hcl Other (See Comments)    Passes out   Lyrica [Pregabalin]    Neurontin [Gabapentin]    Amitriptyline Other (See Comments)    Sleepy   Dicyclomine Other (See Comments)    Other reaction(s): Abdominal Pain   Metformin Nausea Only   Nitroglycerin Other (See Comments)    Makes Blood pressure go to low.     Past Medical History:  Diagnosis Date   Arthritis    degenerative back    BPH with obstruction/lower urinary tract symptoms    CAD (coronary artery disease) 2010   stent   Diabetes mellitus    ED (erectile dysfunction)    Elevated PSA    GERD (gastroesophageal reflux disease)    Gross hematuria    H/O CT scan    recent renal CT due to hematuria, cystoscopy - normal     Hematuria    History of hiatal hernia    History of kidney stones    HLD (hyperlipidemia)    Hypertension    Ischemic heart disease 1991   with angioplasty   Kidney stone    Morbid obesity ([place])  Myocardial infarction [facility])    Peripheral neuropathy    Pernicious anemia    Pneumonia 1995   /w PE,    Pulmonary embolism (HCC)    previous   Renal cyst    Sleep apnea    pt. denies sleep apnea, pt. states he has had 2 studies - last one 2011   Spinal stenosis     Past Surgical History:  Procedure Laterality Date   APPENDECTOMY     BACK SURGERY     x3 back surgeries - /w fusioin, 1- [facility] 2- [facility]   bilateral inguinal hernia repair     cad     stent 2010   CARDIAC CATHETERIZATION  08/17/2014   CARPAL TUNNEL RELEASE  2014   bilateral     CHOLECYSTECTOMY     COLONOSCOPY WITH PROPOFOL N/A 07/11/2019   Procedure: COLONOSCOPY WITH PROPOFOL;  Surgeon: Toledo, Benay Pike, MD;  Location: [facility] ENDOSCOPY;  Service: Gastroenterology;  Laterality: N/A;   CORONARY BALLOON ANGIOPLASTY N/A 07/11/2020   Procedure: CORONARY BALLOON ANGIOPLASTY;  Surgeon: Isaias Cowman, MD;  Location: [place] [facility];  Service: Cardiovascular;  Laterality: N/A;   CORONARY STENT INTERVENTION N/A 06/07/2018   Procedure: CORONARY STENT INTERVENTION;  Surgeon: Nelva Bush, MD;  Location: [place] [facility];  Service: Cardiovascular;  Laterality: N/A;   CORONARY STENT INTERVENTION N/A 07/11/2020   Procedure: CORONARY STENT INTERVENTION;  Surgeon: Isaias Cowman, MD;  Location: [place] [facility];  Service: Cardiovascular;  Laterality: N/A;   ESOPHAGOGASTRODUODENOSCOPY (EGD) WITH PROPOFOL N/A 07/11/2019   Procedure: ESOPHAGOGASTRODUODENOSCOPY (EGD) WITH PROPOFOL;  Surgeon: Toledo, Benay Pike, MD;  Location: [facility] ENDOSCOPY;  Service: Gastroenterology;  Laterality: N/A;   LAMINECTOMY     LEFT HEART CATH AND CORONARY ANGIOGRAPHY N/A 06/07/2018   Procedure: LEFT HEART CATH AND CORONARY ANGIOGRAPHY;  Surgeon: Nelva Bush, MD;  Location: [place] [facility];  Service: Cardiovascular;  Laterality: N/A;   LEFT HEART CATH AND CORONARY ANGIOGRAPHY N/A 08/26/2021   Procedure: LEFT HEART CATH AND CORONARY ANGIOGRAPHY;  Surgeon: Wellington Hampshire, MD;  Location: [place] [facility];  Service: Cardiovascular;  Laterality: N/A;   RIGHT/LEFT HEART CATH AND CORONARY ANGIOGRAPHY N/A 07/11/2020   Procedure: RIGHT/LEFT HEART CATH AND CORONARY ANGIOGRAPHY;  Surgeon: Minna Merritts, MD;  Location: [place] [facility];  Service: Cardiovascular;  Laterality: N/A;    Social History:  reports that he quit smoking about 36 years ago. His smoking use included cigarettes. He has a 45.00 pack-year smoking history. He has quit using smokeless tobacco. He reports that he  does not currently use alcohol. He reports that he does not use drugs.  Family History:  Family History  Problem Relation Age of Onset   Heart failure Mother    Heart disease Father    Heart attack Brother    Skin cancer Sister    Kidney disease Neg Hx    Prostate cancer Neg Hx    Kidney cancer Neg Hx    Bladder Cancer Neg Hx      Prior to Admission medications   Medication Sig Start Date End Date Taking? Authorizing Provider  acetaminophen (TYLENOL) 500 MG tablet Take 500 mg by mouth as needed.     [provider]  apixaban (ELIQUIS) 5 MG TABS tablet TAKE 1 TABLET BY MOUTH TWICE A DAY 10/22/21   Minna Merritts, MD  aspirin EC 81 MG EC tablet Take 1 tablet (81 mg total) by mouth daily. Swallow whole. 08/30/21  Priscella Mann, Sudheer B, MD  atorvastatin (LIPITOR) 40 MG tablet TAKE 1 TABLET BY MOUTH DAILY AT 6 PM. 04/23/21   Gollan, Kathlene November, MD  Coenzyme Q10 (CO Q-10) 200 MG CAPS Take 200 mg by mouth daily.    [provider]  cyanocobalamin (,VITAMIN B-12,) 1000 MCG/ML injection Inject 1,000 mcg into the muscle every 30 (thirty) days. 09/12/19   [provider]  diazepam (VALIUM) 5 MG tablet Take 5 mg by mouth 3 (three) times daily as needed for anxiety or muscle spasms.    [provider]  finasteride (PROSCAR) 5 MG tablet TAKE 1 TABLET BY MOUTH EVERY DAY 12/01/20   McGowan, Larene Beach A, PA-C  fluticasone (FLONASE) 50 MCG/ACT nasal spray Place 2 sprays into both nostrils daily. 08/29/21 11/27/21  [place] Donnamarie Poag, DO  furosemide (LASIX) 40 MG tablet Take 1 tablet (40 mg total) by mouth 2 (two) times daily. 08/29/21 09/28/21  Sidney Ace, MD  glimepiride (AMARYL) 4 MG tablet Take 4 mg by mouth 2 (two) times daily.    [provider]  lisinopril (ZESTRIL) 5 MG tablet Take 1 tablet (5 mg total) by mouth daily. 04/21/21   Minna Merritts, MD  methocarbamol (ROBAXIN) 500 MG tablet Take 1 tablet (500 mg total) by mouth every 8 (eight) hours as  needed for muscle spasms. 09/29/21   Carrie Mew, MD  metoprolol succinate (TOPROL-XL) 50 MG 24 hr tablet TAKE 1 TABLET BY MOUTH EVERY DAY WITH OR IMMEDIATELY FOLLOWING A MEAL 02/05/22   Gollan, Kathlene November, MD  Multiple Vitamin (MULTIVITAMIN WITH MINERALS) TABS tablet Take 1 tablet by mouth daily. 06/08/18   Loletha Grayer, MD  omega-3 acid ethyl esters (LOVAZA) 1 g capsule Take 2 g by mouth daily.    [provider]  omeprazole (PRILOSEC) 40 MG capsule Take 40 mg by mouth 2 (two) times daily. 06/07/19   [provider]  oxyCODONE-acetaminophen (PERCOCET) 10-325 MG tablet Take 1 tablet by mouth every 4 (four) hours as needed for pain.     [provider]  potassium chloride (KLOR-CON) 10 MEQ tablet Take 10 mEq by mouth daily. 07/17/21 07/17/22  [provider]  sildenafil (VIAGRA) 100 MG tablet Take 100 mg by mouth daily as needed for erectile dysfunction.    [provider]  tamsulosin (FLOMAX) 0.4 MG CAPS capsule TAKE 1 CAPSULE BY MOUTH EVERY DAY 12/01/20   Ernestine Conrad, Larene Beach A, PA-C  testosterone cypionate (DEPOTESTOSTERONE CYPIONATE) 200 MG/ML injection Inject 200 mg into the muscle every 28 (twenty-eight) days.    [provider]    Physical Exam: Vitals:   03/22/22 1400 03/22/22 1443 03/22/22 1500 03/22/22 1600  BP: (!) 106/59  (!) 101/56 (!) 107/58  Pulse: (!) 155  84 91  Resp: 15  20 (!) 23  Temp:  98.9 F (37.2 C)    TempSrc:  Oral    SpO2: 100%  100% 100%  Weight:  106.6 kg     General: Not in acute distress HEENT:       Eyes: PERRL, EOMI, no scleral icterus.       ENT: No discharge from the ears and nose, no pharynx injection, no tonsillar enlargement.        Neck: No JVD, no bruit, no mass felt. Heme: No neck lymph node enlargement. Cardiac: S1/S2, RRR, No murmurs, No gallops or rubs. Respiratory: No rales, wheezing, rhonchi or rubs. GI: Soft, nondistended, nontender, no rebound pain, no organomegaly, BS present. GU: No  hematuria Ext:  has trace pitting leg edema bilaterally. 1+DP/PT pulse bilaterally. Musculoskeletal: No joint deformities, No joint redness or warmth, no limitation of ROM in spin. Skin: No rashes.  Neuro: Alert, oriented X3, cranial nerves II-XII grossly intact, moves all extremities normally.  Psych: Patient is not psychotic, no suicidal or hemocidal ideation.  Labs on Admission: I have personally reviewed following labs and imaging studies  CBC: Recent Labs  Lab 03/22/22 1021  WBC 9.4  HGB 8.1*  HCT 24.6*  MCV 90.1  PLT 64*   Basic Metabolic Panel: Recent Labs  Lab 03/22/22 1021  NA 135  K 4.5  CL 106  CO2 21*  GLUCOSE 249*  BUN 21  CREATININE 1.15  CALCIUM 8.5*   GFR: Estimated Creatinine Clearance: 66.7 mL/min (by C-G formula based on SCr of 1.15 mg/dL). Liver Function Tests: No results for input(s): "AST", "ALT", "ALKPHOS", "BILITOT", "PROT", "ALBUMIN" in the last 168 hours. No results for input(s): "LIPASE", "AMYLASE" in the last 168 hours. No results for input(s): "AMMONIA" in the last 168 hours. Coagulation Profile: Recent Labs  Lab 03/22/22 1430  INR 2.0*   Cardiac Enzymes: No results for input(s): "CKTOTAL", "CKMB", "CKMBINDEX", "TROPONINI" in the last 168 hours. BNP (last 3 results) No results for input(s): "PROBNP" in the last 8760 hours. HbA1C: No results for input(s): "HGBA1C" in the last 72 hours. CBG: Recent Labs  Lab 03/22/22 1636  GLUCAP 168*   Lipid Profile: Recent Labs    03/22/22 1025  CHOL 62  HDL 20*  LDLCALC 27  TRIG 75  CHOLHDL 3.1   Thyroid Function Tests: No results for input(s): "TSH", "T4TOTAL", "FREET4", "T3FREE", "THYROIDAB" in the last 72 hours. Anemia Panel: Recent Labs    03/22/22 1506  FOLATE 21.4  FERRITIN 229  TIBC 218*  IRON 29*  RETICCTPCT 3.2*   Urine analysis:    Component Value Date/Time   COLORURINE YELLOW 09/29/2021 1402   APPEARANCEUR CLEAR (A) 09/29/2021 1402   APPEARANCEUR Clear  01/01/2016 1354   LABSPEC 1.015 09/29/2021 1402   LABSPEC 1.023 04/25/2014 0238   PHURINE 5.0 09/29/2021 1402   GLUCOSEU NEGATIVE 09/29/2021 1402   GLUCOSEU 50 mg/dL 04/25/2014 0238   HGBUR NEGATIVE 09/29/2021 1402   BILIRUBINUR NEGATIVE 09/29/2021 1402   BILIRUBINUR Negative 01/01/2016 1354   BILIRUBINUR Negative 04/25/2014 0238   KETONESUR NEGATIVE 09/29/2021 1402   PROTEINUR NEGATIVE 09/29/2021 1402   NITRITE NEGATIVE 09/29/2021 1402   LEUKOCYTESUR NEGATIVE 09/29/2021 1402   LEUKOCYTESUR Negative 04/25/2014 0238   Sepsis Labs: '@LABRCNTIP'$ (procalcitonin:4,lacticidven:4) )No results found for this or any previous visit (from the past 240 hour(s)).   Radiological Exams on Admission: DG Chest 2 View  Result Date: 03/22/2022 CLINICAL DATA:  Chest pain. EXAM: CHEST - 2 VIEW COMPARISON:  August 21, 2021. FINDINGS: Stable cardiomediastinal silhouette. No acute pulmonary disease is noted. Bony thorax is unremarkable. IMPRESSION: No active cardiopulmonary disease. Electronically Signed   By: Marijo Conception M.D.   On: 03/22/2022 10:40      Assessment/Plan Principal Problem:   NSTEMI (non-ST elevated myocardial infarction) ([place]) Active Problems:   CAD (coronary artery disease)   Chronic diastolic CHF (congestive heart failure) (HCC)   Atrial fibrillation, chronic (HCC)   Hx of pulmonary embolus   Diabetes mellitus without complication (HCC)   Thrombocytopenia (HCC)   BPH with obstruction/lower urinary tract symptoms   Normocytic anemia   Hypertension   HLD (hyperlipidemia)   Anxiety   Assessment and Plan: * NSTEMI (non-ST elevated myocardial infarction) ([place]) NSTEMI and  hx of CAD with stent placement: pt has chest pain for 3 days, troponin is elevated at 69, 72.  Consulted Dr. Sophronia Simas of cardiology, who is planning to do cardiac cath on Wednesday  - admit to tele bed as inpatient - switched Eliquis to IV heparin - [employer] - pt is allergic to nitroglycerin, - Morphine,  and aspirin, lipitor  - Risk factor stratification: will check FLP and A1C    CAD (coronary artery disease) -see above  Chronic diastolic CHF (congestive heart failure) ([place]) 2D echo on 01/22/2021 showed EF of 50-55%.  Patient has shortness of breath associated with chest pain, elevated BNP 433,  but patient only has trace leg edema, chest x-ray is negative for pulmonary edema.  No oxygen desaturation.  Patient may have mild fluid overload, but does not seem to have full CHF exacerbation.  -Give 1 dose IV Lasix 40 mg now -Continue home Lasix 40 mg twice daily .  Atrial fibrillation, chronic (HCC) Heart rate 89 -Switched Eliquis to IV heparin -Metoprolol  Hx of pulmonary embolus - IV heparin  Diabetes mellitus without complication (HCC) Recent A1c 6.6, well controlled.  Patient is taking Amaryl -Sliding scale insulin  Thrombocytopenia ([place]) This is a chronic issue.  Platelet 64 (73 on 10/09/2021) -Follow-up with CBC  BPH with obstruction/lower urinary tract symptoms Continue home Proscar and Flomax  Normocytic anemia Hemoglobin 8.1 (12.7 on 01/07/2022), no active rectal bleeding, but reports dark stool which he attributed to use drinking. -Check INR/PTT/type screen -Follow-up CBC  q8h -check anemia panel -protonix 40 mg bid, oral empirically -will transfuse 1 Unit of blood -consulted GI, Dr. Virgina Jock  Hypertension -IV hydralazine as needed -Lisinopril, metoprolol,  HLD (hyperlipidemia) - Lipitor  Anxiety - Continue home Valium as needed          DVT ppx: on IV Heparin     Code Status: Full code  Family Communication: Yes, patient's wife  by phone  Disposition Plan:  Anticipate discharge back to previous environment  Consults called:  Dr. Fletcher Anon of card  Admission status and Level of care: Telemetry Cardiac:    as inpt      Severity of Illness:  The appropriate patient status for this patient is INPATIENT. Inpatient status is judged to be reasonable and  necessary in order to provide the required intensity of service to ensure the patient's safety. The patient's presenting symptoms, physical exam findings, and initial radiographic and laboratory data in the context of their chronic comorbidities is felt to place them at high risk for further clinical deterioration. Furthermore, it is not anticipated that the patient will be medically stable for discharge from the hospital within 2 midnights of admission.   * I certify that at the point of admission it is my clinical judgment that the patient will require inpatient hospital care spanning beyond 2 midnights from the point of admission due to high intensity of service, high risk for further deterioration and high frequency of surveillance required.*       Date of Service 03/22/2022    Ivor Costa [facility]   If 7PM-7AM, please contact night-coverage [email or web address] 03/22/2022, 4:41 PM

## 2022-03-22 NOTE — ED Notes (Signed)
Informed RN bed assigned 

## 2022-03-22 NOTE — Assessment & Plan Note (Signed)
Heart rate 89 -Switched Eliquis to IV heparin -Metoprolol

## 2022-03-22 NOTE — Assessment & Plan Note (Signed)
-  see above 

## 2022-03-22 NOTE — Assessment & Plan Note (Signed)
Continue home Proscar and Flomax

## 2022-03-22 NOTE — Progress Notes (Signed)
PHARMACIST - PHYSICIAN ORDER COMMUNICATION  CONCERNING: P&T Medication Policy on Herbal Medications  DESCRIPTION:  This patient's order for:  Co Q-10  has been noted.  This product(s) is classified as an "herbal" or natural product. Due to a lack of definitive safety studies or FDA approval, nonstandard manufacturing practices, plus the potential risk of unknown drug-drug interactions while on inpatient medications, the Pharmacy and Therapeutics Committee does not permit the use of "herbal" or natural products of this type within [facility].   ACTION TAKEN: The pharmacy department is unable to verify this order at this time and your patient has been informed of this safety policy. Please reevaluate patient's clinical condition at discharge and address if the herbal or natural product(s) should be resumed at that time.   Gretel Acre, PharmD PGY1 [place] Resident 03/22/2022 3:03 PM

## 2022-03-23 ENCOUNTER — Inpatient Hospital Stay (HOSPITAL_COMMUNITY)
Admit: 2022-03-23 | Discharge: 2022-03-23 | Disposition: A | Discharge status: Home / Self Care | Payer: Medicare HMO | Attending: Cardiovascular Disease | Admitting: Cardiovascular Disease

## 2022-03-23 DIAGNOSIS — I5033 Acute on chronic diastolic (congestive) heart failure: Secondary | ICD-10-CM | POA: Diagnosis not present

## 2022-03-23 DIAGNOSIS — I214 Non-ST elevation (NSTEMI) myocardial infarction: Secondary | ICD-10-CM | POA: Diagnosis not present

## 2022-03-23 DIAGNOSIS — R778 Other specified abnormalities of plasma proteins: Secondary | ICD-10-CM | POA: Diagnosis not present

## 2022-03-23 DIAGNOSIS — D649 Anemia, unspecified: Secondary | ICD-10-CM | POA: Diagnosis not present

## 2022-03-23 DIAGNOSIS — I482 Chronic atrial fibrillation, unspecified: Secondary | ICD-10-CM | POA: Diagnosis not present

## 2022-03-23 LAB — ECHOCARDIOGRAM COMPLETE
AR max vel: 1.95 cm2
AV Area VTI: 2.12 cm2
AV Area mean vel: 1.81 cm2
AV Mean grad: 4 mmHg
AV Peak grad: 8.3 mmHg
Ao pk vel: 1.44 m/s
Area-P 1/2: 2.83 cm2
S' Lateral: 4.03 cm
Weight: 3943.59 oz

## 2022-03-23 LAB — BASIC METABOLIC PANEL
Anion gap: 6 (ref 5–15)
BUN: 18 mg/dL (ref 8–23)
CO2: 24 mmol/L (ref 22–32)
Calcium: 8.1 mg/dL — ABNORMAL LOW (ref 8.9–10.3)
Chloride: 106 mmol/L (ref 98–111)
Creatinine, Ser: 0.92 mg/dL (ref 0.61–1.24)
GFR, Estimated: >60 mL/min (ref >60)
Glucose, Bld: 133 mg/dL — ABNORMAL HIGH (ref 70–99)
Potassium: 3.8 mmol/L (ref 3.5–5.1)
Sodium: 136 mmol/L (ref 135–145)

## 2022-03-23 LAB — CBC
HCT: 23.8 % — ABNORMAL LOW (ref 39.0–52.0)
Hemoglobin: 8.3 g/dL — ABNORMAL LOW (ref 13.0–17.0)
MCH: 30.3 pg (ref 26.0–34.0)
MCHC: 34.9 g/dL (ref 30.0–36.0)
MCV: 86.9 fL (ref 80.0–100.0)
Platelets: 63 10*3/uL — ABNORMAL LOW (ref 150–400)
RBC: 2.74 MIL/uL — ABNORMAL LOW (ref 4.22–5.81)
RDW: 17.9 % — ABNORMAL HIGH (ref 11.5–15.5)
WBC: 8.8 10*3/uL (ref 4.0–10.5)
nRBC: 0 % (ref 0.0–0.2)

## 2022-03-23 LAB — HEPARIN LEVEL (UNFRACTIONATED): Heparin Unfractionated: >1.1 IU/mL — ABNORMAL HIGH (ref 0.30–0.70)

## 2022-03-23 LAB — GLUCOSE, CAPILLARY
Glucose-Capillary: 125 mg/dL — ABNORMAL HIGH (ref 70–99)
Glucose-Capillary: 229 mg/dL — ABNORMAL HIGH (ref 70–99)
Glucose-Capillary: 152 mg/dL — ABNORMAL HIGH (ref 70–99)
Glucose-Capillary: 173 mg/dL — ABNORMAL HIGH (ref 70–99)

## 2022-03-23 LAB — APTT: aPTT: 73 seconds — ABNORMAL HIGH (ref 24–36)

## 2022-03-23 LAB — VITAMIN B12: Vitamin B-12: 1328 pg/mL — ABNORMAL HIGH (ref 180–914)

## 2022-03-23 MED ORDER — IPRATROPIUM-ALBUTEROL 0.5-2.5 (3) MG/3ML IN SOLN
3.0000 mL | RESPIRATORY_TRACT | Status: DC | PRN
  Administered 2022-03-26: 3 mL via RESPIRATORY_TRACT
  Filled 2022-03-23 (×2): qty 3

## 2022-03-23 MED ORDER — PERFLUTREN LIPID MICROSPHERE
1.0000 mL | INTRAVENOUS | Status: AC | PRN
  Administered 2022-03-23: 3 mL via INTRAVENOUS

## 2022-03-23 MED ORDER — FUROSEMIDE 10 MG/ML IJ SOLN
40.0000 mg | Freq: Two times a day (BID) | INTRAMUSCULAR | Status: DC
  Administered 2022-03-23 – 2022-03-25 (×5): 40 mg via INTRAVENOUS
  Filled 2022-03-23 (×6): qty 4

## 2022-03-23 NOTE — Progress Notes (Signed)
ANTICOAGULATION CONSULT NOTE  Pharmacy Consult for Heparin Drip Indication: chest pain/ACS  Patient Measurements: Weight: 111.8 kg (246 lb 7.6 oz) Heparin Dosing Weight: 99.9 kg  Labs: Recent Labs    03/22/22 1021 03/22/22 1227 03/22/22 1430 03/22/22 1506 03/22/22 1820 03/22/22 2252 03/23/22 0727 03/23/22 0829  HGB 8.1*  --   --   --  7.7* 8.5* 8.3*  --   HCT 24.6*  --   --   --  23.1* 24.3* 23.8*  --   PLT 64*  --   --   --  69* 62* 63*  --   APTT  --   --  58*  --   --  75* 73*  --   LABPROT  --   --  22.3*  --   --   --   --   --   INR  --   --  2.0*  --   --   --   --   --   HEPARINUNFRC  --   --   --  >1.10*  --   --  >1.10*  --   CREATININE 1.15  --   --   --   --   --   --  0.92  TROPONINIHS 69*   < > 66*  --  62* 72*  --   --    < > = values in this interval not displayed.    Estimated Creatinine Clearance: 85.1 mL/min (by C-G formula based on SCr of 0.92 mg/dL).  Medical History: Past Medical History:  Diagnosis Date   Arthritis    degenerative back    BPH with obstruction/lower urinary tract symptoms    CAD (coronary artery disease) 2010   stent   Diabetes mellitus    ED (erectile dysfunction)    Elevated PSA    GERD (gastroesophageal reflux disease)    Gross hematuria    H/O CT scan    recent renal CT due to hematuria, cystoscopy - normal     Hematuria    History of hiatal hernia    History of kidney stones    HLD (hyperlipidemia)    Hypertension    Ischemic heart disease 1991   with angioplasty   Kidney stone    Morbid obesity ([place])    Myocardial infarction ([place])    Peripheral neuropathy    Pernicious anemia    Pneumonia 1995   /w PE,    Pulmonary embolism ([place])    previous   Renal cyst    Sleep apnea    pt. denies sleep apnea, pt. states he has had 2 studies - last one 2011   Spinal stenosis    Assessment: Patient is a 81yo male presenting with intermittent chest pain. Pharmacy consulted for Heparin dosing for ACS/STEMI.  Patient  was taking Apixaban prior to admission and did have a dose this morning.   Date Time HL/aPTT Rate/Comment  7/17 1506 >1.10  Baseline 7/17 2252 aPTT 75s Therapeutic x 1 7/18 0727 aPTT 73s Therapeutic x 2  Goal of Therapy:  Heparin level 0.3-0.7 units/ml aPTT 66 - 102 seconds Monitor platelets by anticoagulation protocol: Yes   Plan:  Heparin therapeutic x 2 Continue heparin at 1200 units/hr Re-check aPTT & HL tomorrow AM Will check daily CBC per protocol Will check HL at least daily until correlation with aPTT then will switch to HL for monitoring  Benita Gutter 03/23/2022 8:56 AM

## 2022-03-23 NOTE — Progress Notes (Signed)
PROGRESS NOTE   HPI was taken from Dr. Blaine Hamper: Cody Price is a 81 y.o. male with medical history significant of CAD with stent, hypertension, hyperlipidemia, diabetes mellitus, atrial fibrillation and PE on Eliquis, BPH, kidney stone, thrombocytopenia, ulcer disease, dCHF, who presents with chest pain.   Patient states that he has intermittent chest pain for almost 3 days, which is located in the front chest, pressure-like, sometimes 10 out of 10 severity, currently mild, pressure-like, nonradiating, aggravated by exertion and movement.  Associated with shortness breath which is also exertional.  Patient does not have cough, fever or chills.  No nausea, vomiting, diarrhea or abdominal pain.  No symptoms of UTI.  Patient's hemoglobin dropped from 12.7 on 01/07/22 to 8.1 today. He denies rectal bleeding or bleeding from anywhere.  Patient states that he is drinking some juice which make his stool dark.   Data reviewed independently and ED Course: pt was found to have WBC 9.  4, troponin levels 69, 72, GFR> 60, temperature normal, blood pressure 109/56, heart rate 89, RR 20, oxygen saturation 100% on room air.  Chest X-.  Patient is admitted to telemetry bed as inpatient.  Dr. Fletcher Anon of card is consulted.     EKG: I have personally reviewed.  Atrial fibrillation, QTc 452, low voltage, anteroseptal infarction pattern, PVC, poor R wave progression   DEROLD DORSCH  YQM:[id number] DOB: 1941/04/18 DOA: 03/22/2022 PCP: Baxter Hire, MD    Assessment & Plan:   Principal Problem:   NSTEMI (non-ST elevated myocardial infarction) [facility]) Active Problems:   CAD (coronary artery disease)   Chronic diastolic CHF (congestive heart failure) (HCC)   Atrial fibrillation, chronic (HCC)   Hx of pulmonary embolus   Diabetes mellitus without complication (HCC)   Thrombocytopenia (HCC)   BPH with obstruction/lower urinary tract symptoms   Normocytic anemia   Hypertension   HLD (hyperlipidemia)    Anxiety  Assessment and Plan: NSTEMI: w/ mild elevation of troponins. Continue on IV heparin. Cardiac cath is planned for tomorrow. Morphine prn. Continue on aspirin, statin. Continue on tele. Cardio following and recs apprec   Normocytic anemia: w/ dark stools. H&H are labile. S/p 1 unit of pRBCs transfused. Will continue to monitor H&H. Possible EGD tomorrow as per GI. Hold IV heparin 6 hrs prior to EGD as per GI. NPO after midnight. GI recs apprec   Hx of CAD: continue on metoprolol. Hx of previous stent placement    Chronic diastolic CHF: echo on 02/01/4131 showed EF of 50-55%.  Patient has shortness of breath associated with chest pain, elevated BNP 433,  but patient only has trace leg edema, chest x-ray is negative for pulmonary edema.  May have mild fluid overload. Continue on lasix. Monitor I/Os    Chronic a. fib: continue on metoprolol, IV heparin. Continue to hold eliquis   Hx of pulmonary embolus: continue on IV heparin. Holding home dose of eliquis    DM2: well controlled, HbA1c 6.6 Continue on SSI w/ accuchecks   Thrombocytopenia: chronic. Labile    BPH: continue on home dose of flomax, proscar    HTN: continue on home dose of lisinopril, metoprolol. IV hydralazine   HLD: continue on statin    Anxiety: severity unknown. Continue on home dose of valium         DVT prophylaxis: IV heparin  Code Status: full  Family Communication:  Disposition Plan: depends on PT/OT recs (not consulted yet)   Level of care: Telemetry Cardiac  Status is:  Inpatient Remains inpatient appropriate because: severity of illness   Consultants:  Cardio  GI   Procedures:   Antimicrobials:    Subjective: Pt c/o fatigue   Objective: Vitals:   03/22/22 2111 03/22/22 2326 03/23/22 0434 03/23/22 0437  BP: (!) 110/50 (!) 100/46 118/60   Pulse: 96 98 82   Resp: '18 18 18   '$ Temp: 97.9 F (36.6 C) 98 F (36.7 C) 98 F (36.7 C)   TempSrc: Oral     SpO2: 97% 100% 92%   Weight:     111.8 kg    Intake/Output Summary (Last 24 hours) at 03/23/2022 0755 Last data filed at 03/23/2022 0436 Gross per 24 hour  Intake 384 ml  Output 700 ml  Net -316 ml   Filed Weights   03/22/22 1443 03/23/22 0437  Weight: 106.6 kg 111.8 kg    Examination:  General exam: Appears calm and comfortable  Respiratory system: decreased breath sounds b/l  Cardiovascular system: irregularly irregular. No rubs, gallops or clicks. Gastrointestinal system: Abdomen is obese, soft and nontender. Normal bowel sounds heard. Central nervous system: Alert and oriented. Moves all extremities  Psychiatry: Judgement and insight appear normal. Mood & affect appropriate.     Data Reviewed: I have personally reviewed following labs and imaging studies  CBC: Recent Labs  Lab 03/22/22 1021 03/22/22 1820 03/22/22 2252  WBC 9.4 9.6 10.1  HGB 8.1* 7.7* 8.5*  HCT 24.6* 23.1* 24.3*  MCV 90.1 89.9 86.5  PLT 64* 69* 62*   Basic Metabolic Panel: Recent Labs  Lab 03/22/22 1021  NA 135  K 4.5  CL 106  CO2 21*  GLUCOSE 249*  BUN 21  CREATININE 1.15  CALCIUM 8.5*   GFR: Estimated Creatinine Clearance: 68.1 mL/min (by C-G formula based on SCr of 1.15 mg/dL). Liver Function Tests: No results for input(s): "AST", "ALT", "ALKPHOS", "BILITOT", "PROT", "ALBUMIN" in the last 168 hours. No results for input(s): "LIPASE", "AMYLASE" in the last 168 hours. No results for input(s): "AMMONIA" in the last 168 hours. Coagulation Profile: Recent Labs  Lab 03/22/22 1430  INR 2.0*   Cardiac Enzymes: No results for input(s): "CKTOTAL", "CKMB", "CKMBINDEX", "TROPONINI" in the last 168 hours. BNP (last 3 results) No results for input(s): "PROBNP" in the last 8760 hours. HbA1C: Recent Labs    03/22/22 1430  HGBA1C 7.6*   CBG: Recent Labs  Lab 03/22/22 1636 03/22/22 2234  GLUCAP 168* 193*   Lipid Profile: Recent Labs    03/22/22 1025  CHOL 62  HDL 20*  LDLCALC 27  TRIG 75  CHOLHDL 3.1    Thyroid Function Tests: No results for input(s): "TSH", "T4TOTAL", "FREET4", "T3FREE", "THYROIDAB" in the last 72 hours. Anemia Panel: Recent Labs    03/22/22 1506 03/22/22 1820  VITAMINB12  --  1,328*  FOLATE 21.4  --   FERRITIN 229  --   TIBC 218*  --   IRON 29*  --   RETICCTPCT 3.2*  --    Sepsis Labs: No results for input(s): "PROCALCITON", "LATICACIDVEN" in the last 168 hours.  No results found for this or any previous visit (from the past 240 hour(s)).       Radiology Studies: US Abdomen Complete  Result Date: 03/22/2022 CLINICAL DATA:  Assess for cirrhosis and splenomegaly. EXAM: ABDOMEN ULTRASOUND COMPLETE COMPARISON:  Abdominopelvic CT 05/30/2019. FINDINGS: Gallbladder: Surgically absent. Common bile duct: Diameter: 7 mm, normal for cholecystectomy state. Liver: There may be subtle capsular nodularity of the left lobe. No focal lesion  identified. Within normal limits in parenchymal echogenicity. Portal vein is patent on color Doppler imaging with normal direction of blood flow towards the liver. IVC: No abnormality visualized. Pancreas: The majority is obscured, visualized portion unremarkable. Spleen: Enlarged measuring 13.4 x 7.7 x 16.4 cm, volume is 885 cc. Splenic volume has decreased from prior abdominal CT. Right Kidney: Length: 12 cm. 2.4 cm cyst arises from the upper pole. This is benign and needs no further follow-up. Normal parenchymal echogenicity. No hydronephrosis. No renal calculi. Left Kidney: Length: 12.3 cm. Normal parenchymal echogenicity. No hydronephrosis. No visualized stone or focal lesion. Abdominal aorta: No aneurysm visualized. The mid and distal aorta are obscured by overlying bowel gas. Other findings: No abdominal ascites. IMPRESSION: 1. Chronic splenomegaly, although splenic volume has diminished from 2020 CT. 2. Equivocal subtle capsular nodularity of the left lobe of the liver. There are no other findings of cirrhosis, no focal hepatic lesion. 3.  Cholecystectomy without biliary dilatation. Electronically Signed   By: Keith Rake M.D.   On: 03/22/2022 17:43   DG Chest 2 View  Result Date: 03/22/2022 CLINICAL DATA:  Chest pain. EXAM: CHEST - 2 VIEW COMPARISON:  August 21, 2021. FINDINGS: Stable cardiomediastinal silhouette. No acute pulmonary disease is noted. Bony thorax is unremarkable. IMPRESSION: No active cardiopulmonary disease. Electronically Signed   By: Marijo Conception M.D.   On: 03/22/2022 10:40        Scheduled Meds:  aspirin EC  81 mg Oral Daily   atorvastatin  40 mg Oral Daily   [START ON 03/26/2022] cyanocobalamin  1,000 mcg Intramuscular Q30 days   finasteride  5 mg Oral Daily   furosemide  40 mg Oral BID   insulin aspart  0-5 Units Subcutaneous QHS   insulin aspart  0-9 Units Subcutaneous TID WC   lisinopril  10 mg Oral Daily   metoprolol succinate  50 mg Oral Daily   multivitamin with minerals  1 tablet Oral Daily   omega-3 acid ethyl esters  2 g Oral Daily   pantoprazole (PROTONIX) IV  40 mg Intravenous Q12H   tamsulosin  0.4 mg Oral Daily   Continuous Infusions:  heparin 1,200 Units/hr (03/22/22 1714)     LOS: 1 day    Time spent: 35 mins    Wyvonnia Dusky, MD [facility] Pager [phone]  If 7PM-7AM, please contact night-coverage [email or web address] 03/23/2022, 7:55 AM

## 2022-03-23 NOTE — Progress Notes (Signed)
Spoke with patient about PRN treatment. Patient states he is not SOB at this time. Patients BBS are clear/diminished. Instructed patient to call if he changed his mind.

## 2022-03-23 NOTE — Plan of Care (Signed)

## 2022-03-23 NOTE — Progress Notes (Addendum)
Inpatient Follow-up/Progress Note   Patient ID: Cody Price is a 81 y.o. male.  Overnight Events / Subjective Findings NAEON. Pt rcd 1 u prbc with improvement in hgb. Requiring 2LNC at this time. Being evaluated by cardiology. Awaiting echocardiogram. Tolerating PO. Denies n/v. Some upper abdomen/chest pain relieved by pain medication per pt. No further bowel movements. No signs of GIB.  Review of Systems  Constitutional:  Positive for activity change. Negative for appetite change, chills, diaphoresis, fatigue, fever and unexpected weight change.  HENT:  Negative for trouble swallowing and voice change.   Respiratory:  Positive for shortness of breath. Negative for wheezing.   Cardiovascular:  Positive for chest pain and leg swelling. Negative for palpitations.  Gastrointestinal:  Negative for abdominal distention, abdominal pain, anal bleeding, blood in stool, constipation, diarrhea, nausea and vomiting.  Musculoskeletal:  Negative for arthralgias and myalgias.  Skin:  Negative for color change and pallor.  Neurological:  Negative for dizziness, syncope and weakness.  Psychiatric/Behavioral:  Negative for confusion. The patient is not nervous/anxious.   All other systems reviewed and are negative.    Medications  Current Facility-Administered Medications:    oxyCODONE (Oxy IR/ROXICODONE) immediate release tablet 10 mg, 10 mg, Oral, Q4H PRN, 10 mg at 03/23/22 0443 **AND** acetaminophen (TYLENOL) tablet 325 mg, 325 mg, Oral, Q6H PRN, Darrick Penna, RPH, 325 mg at 03/23/22 0272   acetaminophen (TYLENOL) tablet 650 mg, 650 mg, Oral, Q6H PRN, Ivor Costa, MD   albuterol (PROVENTIL) (2.5 MG/3ML) 0.083% nebulizer solution 3 mL, 3 mL, Inhalation, Q4H PRN, Ivor Costa, MD   aspirin EC tablet 81 mg, 81 mg, Oral, Daily, Ivor Costa, MD, 81 mg at 03/23/22 5366   atorvastatin (LIPITOR) tablet 40 mg, 40 mg, Oral, Daily, Ivor Costa, MD, 40 mg at 03/22/22 1708   [START ON 03/26/2022]  cyanocobalamin ((VITAMIN B-12)) injection 1,000 mcg, 1,000 mcg, Intramuscular, Q30 days, Ivor Costa, MD   dextromethorphan-guaiFENesin ([place] DM) 30-600 MG per 12 hr tablet 1 tablet, 1 tablet, Oral, BID PRN, Ivor Costa, MD   diazepam (VALIUM) tablet 5 mg, 5 mg, Oral, TID PRN, Ivor Costa, MD   finasteride (PROSCAR) tablet 5 mg, 5 mg, Oral, Daily, Ivor Costa, MD, 5 mg at 03/23/22 0839   furosemide (LASIX) tablet 40 mg, 40 mg, Oral, BID, Ivor Costa, MD, 40 mg at 03/23/22 0839   heparin ADULT infusion 100 units/mL (25000 units/265m), 1,200 Units/hr, Intravenous, Continuous, KVira Blanco RPH, Last Rate: 12 mL/hr at 03/22/22 1714, 1,200 Units/hr at 03/22/22 1714   hydrALAZINE (APRESOLINE) injection 5 mg, 5 mg, Intravenous, Q2H PRN, NIvor Costa MD   insulin aspart (novoLOG) injection 0-5 Units, 0-5 Units, Subcutaneous, QHS, Niu, XSoledad Gerlach MD   insulin aspart (novoLOG) injection 0-9 Units, 0-9 Units, Subcutaneous, TID WC, NIvor Costa MD, 1 Units at 03/23/22 0839   lisinopril (ZESTRIL) tablet 10 mg, 10 mg, Oral, Daily, NIvor Costa MD, 10 mg at 03/23/22 04403  methocarbamol (ROBAXIN) tablet 500 mg, 500 mg, Oral, Q8H PRN, NIvor Costa MD   metoprolol succinate (TOPROL-XL) 24 hr tablet 50 mg, 50 mg, Oral, Daily, NIvor Costa MD, 50 mg at 03/23/22 04742  morphine (PF) 2 MG/ML injection 2 mg, 2 mg, Intravenous, Q4H PRN, NIvor Costa MD, 2 mg at 03/23/22 0551   multivitamin with minerals tablet 1 tablet, 1 tablet, Oral, Daily, NIvor Costa MD, 1 tablet at 03/23/22 05956  omega-3 acid ethyl esters (LOVAZA) capsule 2 g, 2 g, Oral, Daily, NIvor Costa MD, 2  g at 03/23/22 0838   ondansetron (ZOFRAN) injection 4 mg, 4 mg, Intravenous, Q8H PRN, Ivor Costa, MD   pantoprazole (PROTONIX) injection 40 mg, 40 mg, Intravenous, Q12H, Annamaria Helling, DO, 40 mg at 03/23/22 0839   tamsulosin (FLOMAX) capsule 0.4 mg, 0.4 mg, Oral, Daily, Ivor Costa, MD, 0.4 mg at 03/23/22 0838  heparin 1,200 Units/hr (03/22/22 1714)     oxyCODONE **AND** acetaminophen, acetaminophen, albuterol, dextromethorphan-guaiFENesin, diazepam, hydrALAZINE, methocarbamol, morphine injection, ondansetron (ZOFRAN) IV   Objective    Vitals:   03/22/22 2326 03/23/22 0434 03/23/22 0437 03/23/22 0820  BP: (!) 100/46 118/60  107/63  Pulse: 98 82  83  Resp: '18 18  18  '$ Temp: 98 F (36.7 C) 98 F (36.7 C)  98 F (36.7 C)  TempSrc:      SpO2: 100% 92%  98%  Weight:   111.8 kg      Physical Exam Vitals and nursing note reviewed.  Constitutional:      General: He is not in acute distress.    Appearance: He is obese. He is not ill-appearing, toxic-appearing or diaphoretic.  HENT:     Head: Normocephalic and atraumatic.     Nose: Nose normal.     Mouth/Throat:     Mouth: Mucous membranes are moist.     Pharynx: Oropharynx is clear.  Eyes:     General: No scleral icterus.    Extraocular Movements: Extraocular movements intact.  Cardiovascular:     Rate and Rhythm: Normal rate and regular rhythm.     Heart sounds: Normal heart sounds. No murmur heard.    No friction rub. No gallop.  Pulmonary:     Breath sounds: No wheezing, rhonchi or rales.     Comments: Dyspneic on exertion. On 2L [place]; diminished bilaterally Abdominal:     General: Bowel sounds are normal. There is no distension.     Palpations: Abdomen is soft.     Tenderness: There is no abdominal tenderness. There is no guarding or rebound.  Musculoskeletal:     Cervical back: Neck supple.     Right lower leg: Edema present.     Left lower leg: Edema present.  Skin:    General: Skin is warm and dry.     Coloration: Skin is not jaundiced or pale.     Comments: Facial telangiectasias  Neurological:     General: No focal deficit present.     Mental Status: He is alert and oriented to person, place, and time. Mental status is at baseline.  Psychiatric:        Mood and Affect: Mood normal.        Behavior: Behavior normal.        Thought Content: Thought content  normal.        Judgment: Judgment normal.     Laboratory Data Recent Labs  Lab 03/22/22 1820 03/22/22 2252 03/23/22 0727  WBC 9.6 10.1 8.8  HGB 7.7* 8.5* 8.3*  HCT 23.1* 24.3* 23.8*  PLT 69* 62* 63*   Recent Labs  Lab 03/22/22 1021 03/23/22 0829  NA 135 136  K 4.5 3.8  CL 106 106  CO2 21* 24  BUN 21 18  CREATININE 1.15 0.92  CALCIUM 8.5* 8.1*  GLUCOSE 249* 133*   Recent Labs  Lab 03/22/22 1430  INR 2.0*      Imaging Studies: US Abdomen Complete  Result Date: 03/22/2022 CLINICAL DATA:  Assess for cirrhosis and splenomegaly. EXAM: ABDOMEN ULTRASOUND COMPLETE COMPARISON:  Abdominopelvic  CT 05/30/2019. FINDINGS: Gallbladder: Surgically absent. Common bile duct: Diameter: 7 mm, normal for cholecystectomy state. Liver: There may be subtle capsular nodularity of the left lobe. No focal lesion identified. Within normal limits in parenchymal echogenicity. Portal vein is patent on color Doppler imaging with normal direction of blood flow towards the liver. IVC: No abnormality visualized. Pancreas: The majority is obscured, visualized portion unremarkable. Spleen: Enlarged measuring 13.4 x 7.7 x 16.4 cm, volume is 885 cc. Splenic volume has decreased from prior abdominal CT. Right Kidney: Length: 12 cm. 2.4 cm cyst arises from the upper pole. This is benign and needs no further follow-up. Normal parenchymal echogenicity. No hydronephrosis. No renal calculi. Left Kidney: Length: 12.3 cm. Normal parenchymal echogenicity. No hydronephrosis. No visualized stone or focal lesion. Abdominal aorta: No aneurysm visualized. The mid and distal aorta are obscured by overlying bowel gas. Other findings: No abdominal ascites. IMPRESSION: 1. Chronic splenomegaly, although splenic volume has diminished from 2020 CT. 2. Equivocal subtle capsular nodularity of the left lobe of the liver. There are no other findings of cirrhosis, no focal hepatic lesion. 3. Cholecystectomy without biliary dilatation.  Electronically Signed   By: Keith Rake M.D.   On: 03/22/2022 17:43   DG Chest 2 View  Result Date: 03/22/2022 CLINICAL DATA:  Chest pain. EXAM: CHEST - 2 VIEW COMPARISON:  August 21, 2021. FINDINGS: Stable cardiomediastinal silhouette. No acute pulmonary disease is noted. Bony thorax is unremarkable. IMPRESSION: No active cardiopulmonary disease. Electronically Signed   By: Marijo Conception M.D.   On: 03/22/2022 10:40    Assessment:   # Acute on chronic anemia - stool brown on exam yesterday - no further signs of gib - iron sat 13%, ferritin elevated at 229  # Chest pain and DOE  # Afib on Eliquis  # CAD s/p PCI/stent  # Splenomegaly  # Abnormal Contour to liver on [place] - no h/o cirrhosis  # Thrombocytopenia- chronic  # HFpEF  # diverticulosis # phx colon polyps  Plan:  Pt received 1 u prbc yesterday with appropriate improvement in  hgb No further signs of GIB Discussed case with cardiology, Echocardiogram today. Pending findings will help determine if pt risk stratification to undergo endoscopy/sedation; will continue to monitor patient's clinical status Tentatively plan for EGD tmrw pending pt clinical status; Heparin gtt will need to be held for 6 hours prior to endoscopy Full liquids today NPO at midnight pending ECHO Protonix 40 mg iv q12 h Eliquis on hold. Would need 48 hour washout prior to any endoscopic procedure (last dose morning of 7/17) Monitor H&H.  Transfusion and resuscitation as per primary team (to receive 1 u prbc in ED 7/17) Avoid frequent lab draws to prevent lab induced anemia Supportive care and antiemetics as per primary team Maintain two sites IV access Avoid nsaids Monitor for GIB.  Abdominal US reviewed  Recommend outpatient w/u for possible cirrhosis- possible nodularity on [place], not previously appreciated on CT scans  Esophagogastroduodenoscopy with possible biopsy, control of bleeding, polypectomy, and interventions as necessary has  been discussed with the patient/patient representative. Informed consent was obtained from the patient/patient representative after explaining the indication, nature, and risks of the procedure including but not limited to death, bleeding, perforation, missed neoplasm/lesions, cardiorespiratory compromise, and reaction to medications. Opportunity for questions was given and appropriate answers were provided. Patient/patient representative has verbalized understanding is amenable to undergoing the procedure.   ADDENDUM 1632: Discussed with cardiology post echocardiogram. No real change since 08/2021. Ok to  proceed with endoscopic procedures. Plan for EGD on 7/19 and colonoscopy on 7/20 pending pt condition. D/w cardiology and felt that splitting procedures would be better for pt tolerance  NPO at midnight  I personally performed the service.  Management of other medical comorbidities as per primary team  Thank you for allowing [place] to participate in this patient's care. Please don't hesitate to call if any questions or concerns arise.   Annamaria Helling, DO [facility]  Portions of the record may have been created with voice recognition software. Occasional wrong-word or 'sound-a-like' substitutions may have occurred due to the inherent limitations of voice recognition software.  Read the chart carefully and recognize, using context, where substitutions may have occurred.

## 2022-03-23 NOTE — Progress Notes (Signed)
Progress Note  Patient Name: Cody Price Date of Encounter: 03/23/2022  [facility] Cardiologist: Ida Rogue, MD   Subjective   Patient seen on a.m. rounds.  He denies any chest pain or worsening shortness of breath but states that he feels lousy.  He was placed on oxygen via nasal cannula which he states helps a little.  Hemoglobin 8.3.  Inpatient Medications    Scheduled Meds:  aspirin EC  81 mg Oral Daily   atorvastatin  40 mg Oral Daily   [START ON 03/26/2022] cyanocobalamin  1,000 mcg Intramuscular Q30 days   finasteride  5 mg Oral Daily   furosemide  40 mg Oral BID   insulin aspart  0-5 Units Subcutaneous QHS   insulin aspart  0-9 Units Subcutaneous TID WC   lisinopril  10 mg Oral Daily   metoprolol succinate  50 mg Oral Daily   multivitamin with minerals  1 tablet Oral Daily   omega-3 acid ethyl esters  2 g Oral Daily   pantoprazole (PROTONIX) IV  40 mg Intravenous Q12H   tamsulosin  0.4 mg Oral Daily   Continuous Infusions:  heparin 1,200 Units/hr (03/22/22 1714)   PRN Meds: oxyCODONE **AND** acetaminophen, acetaminophen, albuterol, dextromethorphan-guaiFENesin, diazepam, hydrALAZINE, methocarbamol, morphine injection, ondansetron (ZOFRAN) IV   Vital Signs    Vitals:   03/23/22 0434 03/23/22 0437 03/23/22 0820 03/23/22 1135  BP: 118/60  107/63 (!) 105/57  Pulse: 82  83 69  Resp: '18  18 18  '$ Temp: 98 F (36.7 C)  98 F (36.7 C) 98.2 F (36.8 C)  TempSrc:    Oral  SpO2: 92%  98% 100%  Weight:  111.8 kg      Intake/Output Summary (Last 24 hours) at 03/23/2022 1352 Last data filed at 03/23/2022 1135 Gross per 24 hour  Intake 624 ml  Output 1400 ml  Net -776 ml      03/23/2022    4:37 AM 03/22/2022    2:43 PM 09/29/2021   11:01 AM  Last 3 Weights  Weight (lbs) 246 lb 7.6 oz 235 lb 233 lb  Weight (kg) 111.8 kg 106.595 kg 105.688 kg      Telemetry    Rate controlled A-fib atrial fibrillation rates 70-90- Personally Reviewed  ECG    No  new tracings- Personally Reviewed  Physical Exam   GEN: No acute distress.   Neck: no JVD Cardiac: Irregular, no murmurs, rubs, or gallops.  Respiratory: Clear to auscultation bilaterally.  Respirations are unlabored on 2 L O2 via nasal cannula at rest GI: Soft, nontender, non-distended  MS: 1+ edema to the bilateral lower extremities; No deformity. Neuro:  Nonfocal  Psych: Normal affect   Labs    High Sensitivity Troponin:   Recent Labs  Lab 03/22/22 1021 03/22/22 1227 03/22/22 1430 03/22/22 1820 03/22/22 2252  TROPONINIHS 69* 72* 66* 62* 72*     Chemistry Recent Labs  Lab 03/22/22 1021 03/23/22 0829  NA 135 136  K 4.5 3.8  CL 106 106  CO2 21* 24  GLUCOSE 249* 133*  BUN 21 18  CREATININE 1.15 0.92  CALCIUM 8.5* 8.1*  GFRNONAA >60 >60  ANIONGAP 8 6    Lipids  Recent Labs  Lab 03/22/22 1025  CHOL 62  TRIG 75  HDL 20*  LDLCALC 27  CHOLHDL 3.1    Hematology Recent Labs  Lab 03/22/22 1820 03/22/22 2252 03/23/22 0727  WBC 9.6 10.1 8.8  RBC 2.57* 2.81* 2.74*  HGB 7.7* 8.5*  8.3*  HCT 23.1* 24.3* 23.8*  MCV 89.9 86.5 86.9  MCH 30.0 30.2 30.3  MCHC 33.3 35.0 34.9  RDW 18.2* 17.3* 17.9*  PLT 69* 62* 63*   Thyroid No results for input(s): "TSH", "FREET4" in the last 168 hours.  BNP Recent Labs  Lab 03/22/22 1015  BNP 431.0*    DDimer No results for input(s): "DDIMER" in the last 168 hours.   Radiology    US Abdomen Complete  Result Date: 03/22/2022 CLINICAL DATA:  Assess for cirrhosis and splenomegaly. EXAM: ABDOMEN ULTRASOUND COMPLETE COMPARISON:  Abdominopelvic CT 05/30/2019. FINDINGS: Gallbladder: Surgically absent. Common bile duct: Diameter: 7 mm, normal for cholecystectomy state. Liver: There may be subtle capsular nodularity of the left lobe. No focal lesion identified. Within normal limits in parenchymal echogenicity. Portal vein is patent on color Doppler imaging with normal direction of blood flow towards the liver. IVC: No abnormality  visualized. Pancreas: The majority is obscured, visualized portion unremarkable. Spleen: Enlarged measuring 13.4 x 7.7 x 16.4 cm, volume is 885 cc. Splenic volume has decreased from prior abdominal CT. Right Kidney: Length: 12 cm. 2.4 cm cyst arises from the upper pole. This is benign and needs no further follow-up. Normal parenchymal echogenicity. No hydronephrosis. No renal calculi. Left Kidney: Length: 12.3 cm. Normal parenchymal echogenicity. No hydronephrosis. No visualized stone or focal lesion. Abdominal aorta: No aneurysm visualized. The mid and distal aorta are obscured by overlying bowel gas. Other findings: No abdominal ascites. IMPRESSION: 1. Chronic splenomegaly, although splenic volume has diminished from 2020 CT. 2. Equivocal subtle capsular nodularity of the left lobe of the liver. There are no other findings of cirrhosis, no focal hepatic lesion. 3. Cholecystectomy without biliary dilatation. Electronically Signed   By: Keith Rake M.D.   On: 03/22/2022 17:43   DG Chest 2 View  Result Date: 03/22/2022 CLINICAL DATA:  Chest pain. EXAM: CHEST - 2 VIEW COMPARISON:  August 21, 2021. FINDINGS: Stable cardiomediastinal silhouette. No acute pulmonary disease is noted. Bony thorax is unremarkable. IMPRESSION: No active cardiopulmonary disease. Electronically Signed   By: Marijo Conception M.D.   On: 03/22/2022 10:40    Cardiac Studies  [facility] 08/26/21   Dist LAD lesion is 80% stenosed.   Prox LAD-1 lesion is 10% stenosed.   Prox Cx lesion is 50% stenosed.   Dist RCA lesion is 100% stenosed.   Ost 1st Diag lesion is 90% stenosed.   Prox RCA lesion is 99% stenosed.   Ost 3rd Mrg to 3rd Mrg lesion is 50% stenosed.   Mid LAD lesion is 10% stenosed.   Ost LM lesion is 20% stenosed.   Non-stenotic Prox LAD-2 lesion was previously treated.   The left ventricular systolic function is normal.   LV end diastolic pressure is moderately elevated.   The left ventricular ejection fraction is 50-55%  by visual estimate.   1.  Patent LAD stents with no significant restenosis.  Chronically occluded right coronary artery with left-to-right collaterals and moderate left circumflex disease.  No significant change in coronary anatomy since most recent cardiac catheterization. 2.  Low normal LV systolic function with an EF of 50 to 55%.  Moderately elevated left ventricular end-diastolic pressure at 22 mmHg.  TTE completed 08/24/2021 1. Left ventricular ejection fraction, by estimation, is 50 to 55%. The  left ventricle has low normal function. The left ventricle has no regional  wall motion abnormalities. The left ventricular internal cavity size was  mildly dilated. There is mild  left ventricular  hypertrophy. Left ventricular diastolic parameters are  indeterminate.   2. Right ventricular systolic function is normal. The right ventricular  size is normal. There is moderately elevated pulmonary artery systolic  pressure. The estimated right ventricular systolic pressure is 42.3 mmHg.   3. Left atrial size was moderately dilated.   4. Right atrial size was moderately dilated.   5. The mitral valve is abnormal. Moderate mitral valve regurgitation. No  evidence of mitral stenosis.   6. The aortic valve is calcified. Aortic valve regurgitation is not  visualized. Aortic valve sclerosis/calcification is present, without any  evidence of aortic stenosis.     Patient Profile     81 y.o. male with a past medical history of coronary artery disease status post multiple PCI, chronic diastolic heart failure, permanent atrial fibrillation, type 2 diabetes, previous tobacco use, essential hypertension, and hyperlipidemia who is being seen and evaluated for chest pain with possible unstable angina.  Assessment & Plan    NSTEMI -Patient states that he is pain-free today -Previous symptoms were very similar to prior myocardial infarction that he had had in the past -Troponins only mildly elevated 72,  66, 62, 72 -EKG shows poor R wave progression in anterior leads -Continue heparin drip -Holding off on completion of right and left heart catheterization until GI work-up is complete -Continue cardiac monitor -Continue aspirin -EKG as needed changes or pain  Acute on chronic diastolic heart failure -Echocardiogram ordered and pending -LVEF previously 50 to 55% with moderate pulmonary hypertension and moderate mitral regurgitation -Daily weight, I&O, low-sodium diet -Continue furosemide 40 mg twice daily - -776 output in the last 24 hours  Permanent atrial fibrillation -Rate controlled atrial fibrillation 70-90 -Eliquis is on hold and heparin drip infusing -Continue metoprolol succinate 50 mg daily  Worsening anemia -Hemoglobin 8.3 -Patient continues with shortness of breath that is now requiring oxygen therapy -Pending GI work-up -After GI work-up is completed we will revisit right and left heart catheterization  Essential hypertension -Blood pressure 107/63 -Continue lisinopril 10 mg daily -Vital signs per unit protocol  Hyperlipidemia -LDL 27 on 03/22/2022 -Continue atorvastatin 40 mg daily -Continue Lovaza 2 g daily      For questions or updates, please contact [facility] Please consult [email or web address] for contact info under        Signed, Anwita Mencer, NP  03/23/2022, 1:52 PM

## 2022-03-23 NOTE — TOC Initial Note (Signed)
Transition of Care [facility]) - Initial/Assessment Note    Patient Details  Name: Cody Price MRN: [id number] Date of Birth: Nov 04, 1940  [facility] CM/SW Contact:    Candie Chroman, LCSW Phone Number: 03/23/2022, 11:10 AM  Clinical Narrative:  Readmission prevention screen complete. CSW met with patient. No supports at bedside. CSW introduced role and explained that discharge planning would be discussed. PCP is Harrel Lemon, MD. Patient drives himself to appointments. [facility] He reports having issues with pharmacy not wanting to fill his prescriptions due to documentation concerns. No home health or DME use prior to admission. He is not on oxygen at home but is currently on oxygen in the room. His nephew [name] will likely transport him home at discharge. No further concerns. CSW encouraged patient to contact CSW as needed. CSW will continue to follow patient for support and facilitate return home when stable.               Expected Discharge Plan: Home/Self Care Barriers to Discharge: Continued Medical Work up   Patient Goals and CMS Choice        Expected Discharge Plan and Services Expected Discharge Plan: Home/Self Care     Post Acute Care Choice: NA Living arrangements for the past 2 months: Single Family Home                                      Prior Living Arrangements/Services Living arrangements for the past 2 months: Single Family Home Lives with:: Spouse Patient language and need for interpreter reviewed:: Yes Do you feel safe going back to the place where you live?: Yes      Need for Family Participation in Patient Care: Yes (Comment) Care giver support system in place?: Yes (comment)   Criminal Activity/Legal Involvement Pertinent to Current Situation/Hospitalization: No - Comment as needed  Activities of Daily Living Home Assistive Devices/Equipment: Cane (specify quad or straight) ADL Screening (condition at time of  admission) Patient's cognitive ability adequate to safely complete daily activities?: Yes Is the patient deaf or have difficulty hearing?: No Does the patient have difficulty seeing, even when wearing glasses/contacts?: No Does the patient have difficulty concentrating, remembering, or making decisions?: No Patient able to express need for assistance with ADLs?: Yes Does the patient have difficulty dressing or bathing?: No Independently performs ADLs?: No Communication: Independent Dressing (OT): Independent Grooming: Independent Feeding: Independent Bathing: Needs assistance Is this a change from baseline?: Change from baseline, expected to last <3 days Toileting: Needs assistance Is this a change from baseline?: Change from baseline, expected to last <3 days In/Out Bed: Needs assistance Is this a change from baseline?: Change from baseline, expected to last <3 days Walks in Home: Independent Does the patient have difficulty walking or climbing stairs?: Yes Weakness of Legs: Both Weakness of Arms/Hands: None  Permission Sought/Granted                  Emotional Assessment Appearance:: Appears stated age Attitude/Demeanor/Rapport: Engaged, Gracious Affect (typically observed): Accepting, Appropriate, Calm, Pleasant Orientation: : Oriented to Self, Oriented to Place, Oriented to  Time, Oriented to Situation Alcohol / Substance Use: Not Applicable Psych Involvement: No (comment)  Admission diagnosis:  Crescendo angina ([place]) [I20.0] NSTEMI (non-ST elevated myocardial infarction) ([place]) [I21.4] Near syncope [R55] Chest pain [R07.9] Patient Active Problem List   Diagnosis Date Noted   HLD (hyperlipidemia)  Atrial fibrillation, chronic (HCC)    Diabetes mellitus without complication ([place])    Anxiety    Chronic diastolic CHF (congestive heart failure) ([place])    Coronary artery disease involving native coronary artery of native heart without angina pectoris    Acute on  chronic diastolic CHF (congestive heart failure) ([place]) 07/08/2020   Atherosclerosis of native coronary artery of native heart with stable angina pectoris ([place]) 06/12/2018   NSTEMI (non-ST elevated myocardial infarction) ([place])    Weakness 02/13/2018   Chronic postoperative pain 04/13/2017   Lumbar post-laminectomy syndrome 04/13/2017   Antiplatelet or antithrombotic long-term use 04/11/2017   Neuropathy 09/21/2016   Chronic, continuous use of opioids 05/27/2016   Erectile dysfunction 01/01/2016   BPH with obstruction/lower urinary tract symptoms 01/01/2016   Pneumonia 11/03/2015   Nocturia 10/02/2015   Erectile dysfunction of organic origin 10/02/2015   Angina pectoris ([place]) 09/23/2015   Thrombocytopenia ([place]) 09/23/2015   B12 deficiency 05/26/2015   Normocytic anemia 05/08/2014   Diabetes mellitus type 2, uncomplicated ([place]) 32/10/3341   DDD (degenerative disc disease) 05/08/2014   Elevated PSA 05/08/2014   H/O ulcer disease 05/08/2014   History of kidney stones 05/08/2014   Hx of pulmonary embolus 05/08/2014   Hypertension 05/08/2014   Kidney stones 05/08/2014   Obesity 05/08/2014   Spinal stenosis 05/08/2014   Chronic back pain 06/21/2013   Diabetes mellitus ([place]) 06/20/2012   Coronary atherosclerosis 05/28/2010   ATRIAL FIBRILLATION 05/28/2010   CAD (coronary artery disease) 2010   PCP:  Baxter Hire, MD [facility]   [id number] [place] [place] [address]Phone: [phone]: [phone]    Social Determinants of Health (SDOH) Interventions    Readmission Risk Interventions    03/23/2022   11:07 AM 08/24/2021    9:55 AM 07/09/2020    9:40 AM  Readmission Risk Prevention Plan  Transportation Screening Complete Complete Complete  PCP or Specialist Appt within 3-5 Days Complete Complete Complete  HRI or [place] Complete Complete  Social Work Consult for R[place]aleskiPlanning/Counseling Complete  Complete Complete  Palliative Care Screening Not Applicable Not Applicable Complete  Medication Review (Press photographer Complete Complete Complete

## 2022-03-24 ENCOUNTER — Encounter
Admission: EM | Disposition: A | Discharge status: Home Health | Payer: Self-pay | Source: Home / Self Care | Attending: Internal Medicine

## 2022-03-24 ENCOUNTER — Inpatient Hospital Stay: Payer: Medicare HMO | Admitting: General Practice

## 2022-03-24 ENCOUNTER — Encounter: Payer: Self-pay | Admitting: Internal Medicine

## 2022-03-24 DIAGNOSIS — R079 Chest pain, unspecified: Secondary | ICD-10-CM

## 2022-03-24 DIAGNOSIS — I10 Essential (primary) hypertension: Secondary | ICD-10-CM | POA: Diagnosis not present

## 2022-03-24 DIAGNOSIS — Z86711 Personal history of pulmonary embolism: Secondary | ICD-10-CM | POA: Diagnosis not present

## 2022-03-24 DIAGNOSIS — D509 Iron deficiency anemia, unspecified: Secondary | ICD-10-CM

## 2022-03-24 DIAGNOSIS — I5033 Acute on chronic diastolic (congestive) heart failure: Secondary | ICD-10-CM | POA: Diagnosis not present

## 2022-03-24 DIAGNOSIS — I482 Chronic atrial fibrillation, unspecified: Secondary | ICD-10-CM | POA: Diagnosis not present

## 2022-03-24 DIAGNOSIS — I2510 Atherosclerotic heart disease of native coronary artery without angina pectoris: Secondary | ICD-10-CM

## 2022-03-24 HISTORY — PX: ESOPHAGOGASTRODUODENOSCOPY (EGD) WITH PROPOFOL: SHX5813

## 2022-03-24 LAB — BASIC METABOLIC PANEL
Anion gap: 4 — ABNORMAL LOW (ref 5–15)
BUN: 17 mg/dL (ref 8–23)
CO2: 25 mmol/L (ref 22–32)
Calcium: 8 mg/dL — ABNORMAL LOW (ref 8.9–10.3)
Chloride: 108 mmol/L (ref 98–111)
Creatinine, Ser: 0.9 mg/dL (ref 0.61–1.24)
GFR, Estimated: >60 mL/min (ref >60)
Glucose, Bld: 117 mg/dL — ABNORMAL HIGH (ref 70–99)
Potassium: 3.8 mmol/L (ref 3.5–5.1)
Sodium: 137 mmol/L (ref 135–145)

## 2022-03-24 LAB — GLUCOSE, CAPILLARY
Glucose-Capillary: 103 mg/dL — ABNORMAL HIGH (ref 70–99)
Glucose-Capillary: 129 mg/dL — ABNORMAL HIGH (ref 70–99)
Glucose-Capillary: 148 mg/dL — ABNORMAL HIGH (ref 70–99)
Glucose-Capillary: 215 mg/dL — ABNORMAL HIGH (ref 70–99)

## 2022-03-24 LAB — CBC
HCT: 23.1 % — ABNORMAL LOW (ref 39.0–52.0)
Hemoglobin: 7.8 g/dL — ABNORMAL LOW (ref 13.0–17.0)
MCH: 29.7 pg (ref 26.0–34.0)
MCHC: 33.8 g/dL (ref 30.0–36.0)
MCV: 87.8 fL (ref 80.0–100.0)
Platelets: 59 10*3/uL — ABNORMAL LOW (ref 150–400)
RBC: 2.63 MIL/uL — ABNORMAL LOW (ref 4.22–5.81)
RDW: 18 % — ABNORMAL HIGH (ref 11.5–15.5)
WBC: 8.7 10*3/uL (ref 4.0–10.5)
nRBC: 0 % (ref 0.0–0.2)

## 2022-03-24 LAB — HEPARIN LEVEL (UNFRACTIONATED): Heparin Unfractionated: >1.1 IU/mL — ABNORMAL HIGH (ref 0.30–0.70)

## 2022-03-24 LAB — PREPARE RBC (CROSSMATCH)

## 2022-03-24 LAB — APTT: aPTT: 77 seconds — ABNORMAL HIGH (ref 24–36)

## 2022-03-24 SURGERY — ESOPHAGOGASTRODUODENOSCOPY (EGD) WITH PROPOFOL
Anesthesia: General

## 2022-03-24 MED ORDER — LISINOPRIL 5 MG PO TABS
5.0000 mg | ORAL_TABLET | Freq: Every day | ORAL | Status: DC
  Administered 2022-03-24: 5 mg via ORAL
  Filled 2022-03-24: qty 1

## 2022-03-24 MED ORDER — DEXMEDETOMIDINE (PRECEDEX) IN NS 20 MCG/5ML (4 MCG/ML) IV SYRINGE
PREFILLED_SYRINGE | INTRAVENOUS | Status: DC | PRN
  Administered 2022-03-24: 8 ug via INTRAVENOUS

## 2022-03-24 MED ORDER — PHENYLEPHRINE HCL (PRESSORS) 10 MG/ML IV SOLN
INTRAVENOUS | Status: DC | PRN
  Administered 2022-03-24 (×4): 160 ug via INTRAVENOUS
  Administered 2022-03-24: 80 ug via INTRAVENOUS

## 2022-03-24 MED ORDER — PROPOFOL 10 MG/ML IV BOLUS
INTRAVENOUS | Status: DC | PRN
  Administered 2022-03-24 (×2): 20 mg via INTRAVENOUS

## 2022-03-24 MED ORDER — FENTANYL CITRATE (PF) 100 MCG/2ML IJ SOLN
INTRAMUSCULAR | Status: AC
  Filled 2022-03-24: qty 2

## 2022-03-24 MED ORDER — SODIUM CHLORIDE 0.9 % IV SOLN: INTRAVENOUS | Status: DC

## 2022-03-24 MED ORDER — EPHEDRINE 5 MG/ML INJ
INTRAVENOUS | Status: AC
  Filled 2022-03-24: qty 5

## 2022-03-24 MED ORDER — FENTANYL CITRATE (PF) 100 MCG/2ML IJ SOLN
INTRAMUSCULAR | Status: DC | PRN
  Administered 2022-03-24: 25 ug via INTRAVENOUS

## 2022-03-24 MED ORDER — SODIUM CHLORIDE 0.9% IV SOLUTION: Freq: Once | INTRAVENOUS | Status: DC

## 2022-03-24 MED ORDER — HEPARIN (PORCINE) 25000 UT/250ML-% IV SOLN
1200.0000 [IU]/h | INTRAVENOUS | Status: DC
  Administered 2022-03-24: 1200 [IU]/h via INTRAVENOUS
  Filled 2022-03-24: qty 250

## 2022-03-24 NOTE — Interval H&P Note (Signed)
History and Physical Interval Note: Progress note from 03/24/22  was reviewed and there was no interval change after seeing and examining the patient.  Written consent was obtained from the patient after discussion of risks, benefits, and alternatives. Patient has consented to proceed with Esophagogastroduodenoscopy with possible intervention   03/24/2022 1:45 PM  Cody Price  has presented today for surgery, with the diagnosis of anemia, melena.  The various methods of treatment have been discussed with the patient and family. After consideration of risks, benefits and other options for treatment, the patient has consented to  Procedure(s): ESOPHAGOGASTRODUODENOSCOPY (EGD) WITH PROPOFOL (N/A) as a surgical intervention.  The patient's history has been reviewed, patient examined, no change in status, stable for surgery.  I have reviewed the patient's chart and labs.  Questions were answered to the patient's satisfaction.     Annamaria Helling

## 2022-03-24 NOTE — H&P (View-Only) (Signed)
Inpatient Follow-up/Progress Note   Patient ID: Cody Price is a 81 y.o. male.  Overnight Events / Subjective Findings Pt feels he is no longer needs oxygen supplementation. Receiving 1 u prbc today since hgb was 7.8. NPO since midnight for EGD today. No other acute gi complaints.  Review of Systems  Constitutional:  Positive for activity change. Negative for appetite change, chills, diaphoresis, fatigue, fever and unexpected weight change.  HENT:  Negative for trouble swallowing and voice change.   Respiratory:  Positive for shortness of breath. Negative for wheezing.   Cardiovascular:  Positive for chest pain and leg swelling. Negative for palpitations.  Gastrointestinal:  Negative for abdominal distention, abdominal pain, anal bleeding, blood in stool, constipation, diarrhea, nausea and vomiting.  Musculoskeletal:  Negative for arthralgias and myalgias.  Skin:  Negative for color change and pallor.  Neurological:  Negative for dizziness, syncope and weakness.  Psychiatric/Behavioral:  Negative for confusion. The patient is not nervous/anxious.   All other systems reviewed and are negative.    Medications  Current Facility-Administered Medications:    0.9 %  sodium chloride infusion (Manually program via Guardrails IV Fluids), , Intravenous, Once, Jennye Boroughs, MD   oxyCODONE (Oxy IR/ROXICODONE) immediate release tablet 10 mg, 10 mg, Oral, Q4H PRN, 10 mg at 03/24/22 0815 **AND** acetaminophen (TYLENOL) tablet 325 mg, 325 mg, Oral, Q6H PRN, Darrick Penna, RPH, 325 mg at 03/24/22 0815   acetaminophen (TYLENOL) tablet 650 mg, 650 mg, Oral, Q6H PRN, Ivor Costa, MD   aspirin EC tablet 81 mg, 81 mg, Oral, Daily, Ivor Costa, MD, 81 mg at 03/24/22 0816   atorvastatin (LIPITOR) tablet 40 mg, 40 mg, Oral, Daily, Ivor Costa, MD, 40 mg at 03/23/22 1742   [START ON 03/26/2022] cyanocobalamin ((VITAMIN B-12)) injection 1,000 mcg, 1,000 mcg, Intramuscular, Q30 days, Ivor Costa, MD    dextromethorphan-guaiFENesin [facility] DM) 30-600 MG per 12 hr tablet 1 tablet, 1 tablet, Oral, BID PRN, Ivor Costa, MD   diazepam (VALIUM) tablet 5 mg, 5 mg, Oral, TID PRN, Ivor Costa, MD, 5 mg at 03/23/22 2311   finasteride (PROSCAR) tablet 5 mg, 5 mg, Oral, Daily, Ivor Costa, MD, 5 mg at 03/24/22 0816   furosemide (LASIX) injection 40 mg, 40 mg, Intravenous, BID, End, Christopher, MD, 40 mg at 03/24/22 0818   hydrALAZINE (APRESOLINE) injection 5 mg, 5 mg, Intravenous, Q2H PRN, Ivor Costa, MD   insulin aspart (novoLOG) injection 0-5 Units, 0-5 Units, Subcutaneous, QHS, Niu, Xilin, MD   insulin aspart (novoLOG) injection 0-9 Units, 0-9 Units, Subcutaneous, TID WC, Ivor Costa, MD, 2 Units at 03/23/22 1741   ipratropium-albuterol (DUONEB) 0.5-2.5 (3) MG/3ML nebulizer solution 3 mL, 3 mL, Nebulization, Q4H PRN, Wyvonnia Dusky, MD   lisinopril (ZESTRIL) tablet 5 mg, 5 mg, Oral, Daily, Agbor-Etang, Brian, MD, 5 mg at 03/24/22 0817   methocarbamol (ROBAXIN) tablet 500 mg, 500 mg, Oral, Q8H PRN, Ivor Costa, MD   metoprolol succinate (TOPROL-XL) 24 hr tablet 50 mg, 50 mg, Oral, Daily, Ivor Costa, MD, 50 mg at 03/24/22 0816   morphine (PF) 2 MG/ML injection 2 mg, 2 mg, Intravenous, Q4H PRN, Ivor Costa, MD, 2 mg at 03/23/22 1253   multivitamin with minerals tablet 1 tablet, 1 tablet, Oral, Daily, Ivor Costa, MD, 1 tablet at 03/24/22 0816   omega-3 acid ethyl esters (LOVAZA) capsule 2 g, 2 g, Oral, Daily, Ivor Costa, MD, 2 g at 03/24/22 0817   ondansetron (ZOFRAN) injection 4 mg, 4 mg, Intravenous, Q8H PRN, Blaine Hamper,  Soledad Gerlach, MD   pantoprazole (PROTONIX) injection 40 mg, 40 mg, Intravenous, Q12H, Annamaria Helling, DO, 40 mg at 03/24/22 0817   tamsulosin (FLOMAX) capsule 0.4 mg, 0.4 mg, Oral, Daily, Ivor Costa, MD, 0.4 mg at 03/24/22 0816    oxyCODONE **AND** acetaminophen, acetaminophen, dextromethorphan-guaiFENesin, diazepam, hydrALAZINE, ipratropium-albuterol, methocarbamol, morphine injection,  ondansetron (ZOFRAN) IV   Objective    Vitals:   03/23/22 2311 03/24/22 0423 03/24/22 0538 03/24/22 0742  BP: (!) 104/50 (!) 102/54  (!) 94/32  Pulse: 95 77  90  Resp: '16 16  19  '$ Temp: 97.6 F (36.4 C) 98.4 F (36.9 C)  98.2 F (36.8 C)  TempSrc:      SpO2: 99% 99%  95%  Weight:   110.9 kg      Physical Exam Vitals and nursing note reviewed.  Constitutional:      General: He is not in acute distress.    Appearance: He is obese. He is not ill-appearing, toxic-appearing or diaphoretic.  HENT:     Head: Normocephalic and atraumatic.     Nose: Nose normal.     Mouth/Throat:     Mouth: Mucous membranes are moist.     Pharynx: Oropharynx is clear.  Eyes:     General: No scleral icterus.    Extraocular Movements: Extraocular movements intact.  Cardiovascular:     Rate and Rhythm: Normal rate and regular rhythm.     Heart sounds: Normal heart sounds. No murmur heard.    No friction rub. No gallop.  Pulmonary:     Breath sounds: No wheezing, rhonchi or rales.     Comments: Diminished bilaterally Abdominal:     General: Bowel sounds are normal. There is no distension.     Palpations: Abdomen is soft.     Tenderness: There is no abdominal tenderness. There is no guarding or rebound.  Musculoskeletal:     Cervical back: Neck supple.     Right lower leg: Edema present.     Left lower leg: Edema present.  Skin:    General: Skin is warm and dry.     Coloration: Skin is not jaundiced or pale.     Comments: Facial telangiectasias  Neurological:     General: No focal deficit present.     Mental Status: He is alert and oriented to person, place, and time. Mental status is at baseline.  Psychiatric:        Mood and Affect: Mood normal.        Behavior: Behavior normal.        Thought Content: Thought content normal.        Judgment: Judgment normal.      Laboratory Data Recent Labs  Lab 03/22/22 2252 03/23/22 0727 03/24/22 0439  WBC 10.1 8.8 8.7  HGB 8.5* 8.3* 7.8*   HCT 24.3* 23.8* 23.1*  PLT 62* 63* 59*    Recent Labs  Lab 03/22/22 1021 03/23/22 0829 03/24/22 0439  NA 135 136 137  K 4.5 3.8 3.8  CL 106 106 108  CO2 21* 24 25  BUN '21 18 17  '$ CREATININE 1.15 0.92 0.90  CALCIUM 8.5* 8.1* 8.0*  GLUCOSE 249* 133* 117*    Recent Labs  Lab 03/22/22 1430  INR 2.0*       Imaging Studies: ECHOCARDIOGRAM COMPLETE  Result Date: 03/23/2022    ECHOCARDIOGRAM REPORT   Patient Name:   Cherlyn Labella Date of Exam: 03/23/2022 Medical Rec #:  [id number]      Height:  74.0 in Accession #:    [id number]     Weight:       246.5 lb Date of Birth:  08-29-1941       BSA:          2.375 m Patient Age:    56 years       BP:           105/57 mmHg Patient Gender: M              HR:           83 bpm. Exam Location:  [facility] Procedure: 2D Echo, Cardiac Doppler, Color Doppler and Intracardiac            Opacification Agent Indications:     Elevated troponin  History:         Patient has prior history of Echocardiogram examinations, most                  recent 08/24/2021. CHF, CAD and Previous Myocardial Infarction,                  Coronary stent, Arrythmias:Atrial Fibrillation; Risk                  Factors:Former Smoker, Diabetes, Hypertension and Dyslipidemia.  Sonographer:     Rosalia Hammers Referring Phys:  [id number] A ARIDA Diagnosing Phys: Nelva Bush MD  Sonographer Comments: Image acquisition challenging due to patient body habitus. IMPRESSIONS  1. Left ventricular ejection fraction, by estimation, is 50 to 55%. The left ventricle has low normal function. The left ventricle demonstrates regional wall motion abnormalities (see scoring diagram/findings for description). There is moderate left ventricular hypertrophy. Left ventricular diastolic function could not be evaluated. There is mild hypokinesis of the left ventricular, basal inferior and inferolateral segments.  2. Right ventricular systolic function is normal. The right ventricular size is mildly enlarged.  There is moderately elevated pulmonary artery systolic pressure. The estimated right ventricular systolic pressure is 22.0 mmHg.  3. Left atrial size was mildly dilated.  4. Right atrial size was mildly dilated.  5. The mitral valve is abnormal. Mild to moderate mitral valve regurgitation.  6. Tricuspid valve regurgitation is mild to moderate.  7. The aortic valve is tricuspid. There is mild thickening of the aortic valve. Aortic valve regurgitation is not visualized. Aortic valve sclerosis is present, with no evidence of aortic valve stenosis.  8. Aortic dilatation noted. There is borderline dilatation of the aortic root and of the ascending aorta, measuring 38 mm.  9. The inferior vena cava is dilated in size with <50% respiratory variability, suggesting right atrial pressure of 15 mmHg. FINDINGS  Left Ventricle: Left ventricular ejection fraction, by estimation, is 50 to 55%. The left ventricle has low normal function. The left ventricle demonstrates regional wall motion abnormalities. Mild hypokinesis of the left ventricular, basal inferior and  inferolateral segments. Definity contrast agent was given IV to delineate the left ventricular endocardial borders. The left ventricular internal cavity size was normal in size. There is moderate left ventricular hypertrophy. Left ventricular diastolic function could not be evaluated due to atrial fibrillation. Left ventricular diastolic function could not be evaluated. Right Ventricle: The right ventricular size is mildly enlarged. No increase in right ventricular wall thickness. Right ventricular systolic function is normal. There is moderately elevated pulmonary artery systolic pressure. The tricuspid regurgitant velocity is 3.12 m/s, and with an assumed right atrial pressure of 15 mmHg, the estimated right ventricular systolic pressure  is 53.9 mmHg. Left Atrium: Left atrial size was mildly dilated. Right Atrium: Right atrial size was mildly dilated. Pericardium:  There is no evidence of pericardial effusion. Mitral Valve: The mitral valve is abnormal. There is mild thickening of the mitral valve leaflet(s). Mild to moderate mitral valve regurgitation. Tricuspid Valve: The tricuspid valve is normal in structure. Tricuspid valve regurgitation is mild to moderate. Aortic Valve: The aortic valve is tricuspid. There is mild thickening of the aortic valve. There is mild aortic valve annular calcification. Aortic valve regurgitation is not visualized. Aortic valve sclerosis is present, with no evidence of aortic valve  stenosis. Aortic valve mean gradient measures 4.0 mmHg. Aortic valve peak gradient measures 8.3 mmHg. Aortic valve area, by VTI measures 2.12 cm. Pulmonic Valve: The pulmonic valve was normal in structure. Pulmonic valve regurgitation is mild. Aorta: Aortic dilatation noted. There is borderline dilatation of the aortic root and of the ascending aorta, measuring 38 mm. Pulmonary Artery: The pulmonary artery is of normal size. Venous: The inferior vena cava is dilated in size with less than 50% respiratory variability, suggesting right atrial pressure of 15 mmHg. IAS/Shunts: No atrial level shunt detected by color flow Doppler.  LEFT VENTRICLE PLAX 2D LVIDd:         4.76 cm LVIDs:         4.03 cm LV PW:         1.51 cm LV IVS:        1.64 cm LVOT diam:     1.90 cm LV SV:         58 LV SV Index:   25 LVOT Area:     2.84 cm  RIGHT VENTRICLE RV Basal diam:  5.18 cm TAPSE (M-mode): 2.5 cm LEFT ATRIUM              Index        RIGHT ATRIUM           Index LA diam:        4.30 cm  1.81 cm/m   RA Area:     23.50 cm LA Vol (A2C):   111.0 ml 46.73 ml/m  RA Volume:   73.10 ml  30.78 ml/m LA Vol (A4C):   70.2 ml  29.55 ml/m LA Biplane Vol: 88.3 ml  37.17 ml/m  AORTIC VALVE AV Area (Vmax):    1.95 cm AV Area (Vmean):   1.81 cm AV Area (VTI):     2.12 cm AV Vmax:           144.00 cm/s AV Vmean:          96.800 cm/s AV VTI:            0.275 m AV Peak Grad:      8.3 mmHg  AV Mean Grad:      4.0 mmHg LVOT Vmax:         99.00 cm/s LVOT Vmean:        61.900 cm/s LVOT VTI:          0.206 m LVOT/AV VTI ratio: 0.75  AORTA Ao Root diam: 3.80 cm MITRAL VALVE                TRICUSPID VALVE MV Area (PHT): 2.83 cm     TR Peak grad:   38.9 mmHg MV Decel Time: 268 msec     TR Vmax:        312.00 cm/s MV E velocity: 114.00 cm/s  SHUNTS                             Systemic VTI:  0.21 m                             Systemic Diam: 1.90 cm Nelva Bush MD Electronically signed by Nelva Bush MD Signature Date/Time: 03/23/2022/4:26:17 PM    Final    US Abdomen Complete  Result Date: 03/22/2022 CLINICAL DATA:  Assess for cirrhosis and splenomegaly. EXAM: ABDOMEN ULTRASOUND COMPLETE COMPARISON:  Abdominopelvic CT 05/30/2019. FINDINGS: Gallbladder: Surgically absent. Common bile duct: Diameter: 7 mm, normal for cholecystectomy state. Liver: There may be subtle capsular nodularity of the left lobe. No focal lesion identified. Within normal limits in parenchymal echogenicity. Portal vein is patent on color Doppler imaging with normal direction of blood flow towards the liver. IVC: No abnormality visualized. Pancreas: The majority is obscured, visualized portion unremarkable. Spleen: Enlarged measuring 13.4 x 7.7 x 16.4 cm, volume is 885 cc. Splenic volume has decreased from prior abdominal CT. Right Kidney: Length: 12 cm. 2.4 cm cyst arises from the upper pole. This is benign and needs no further follow-up. Normal parenchymal echogenicity. No hydronephrosis. No renal calculi. Left Kidney: Length: 12.3 cm. Normal parenchymal echogenicity. No hydronephrosis. No visualized stone or focal lesion. Abdominal aorta: No aneurysm visualized. The mid and distal aorta are obscured by overlying bowel gas. Other findings: No abdominal ascites. IMPRESSION: 1. Chronic splenomegaly, although splenic volume has diminished from 2020 CT. 2. Equivocal subtle capsular nodularity of the left  lobe of the liver. There are no other findings of cirrhosis, no focal hepatic lesion. 3. Cholecystectomy without biliary dilatation. Electronically Signed   By: Keith Rake M.D.   On: 03/22/2022 17:43    Assessment:   # Acute on chronic anemia - stool brown on exam on presentation - no further signs of gib - iron sat 13%, ferritin elevated at 229 - has rcd 2 u prbc during hospitalization  # Chest pain and DOE  # Afib on Eliquis  # CAD s/p PCI/stent  # Splenomegaly  # Abnormal Contour to liver on [place] - no h/o cirrhosis  # Thrombocytopenia- chronic  # HFpEF  # diverticulosis # phx colon polyps  Plan:  Pt received 1 u prbc today for hgb 7.8 No further signs of GIB Plan for EGD today, npo since midnight Heparin gtt held since this morning Plan for colonoscopy tomorrow. Will provide go lytely prep. NPO at midnight. Will need heparin gtt held again. Prep ok to drink after npo order.  Protonix 40 mg iv q12 h Eliquis on hold. Would need 48 hour washout prior to any endoscopic procedure (last dose morning of 7/17) Monitor H&H.  Transfusion and resuscitation as per primary team  Avoid frequent lab draws to prevent lab induced anemia Supportive care and antiemetics as per primary team Maintain two sites IV access Avoid nsaids Monitor for GIB.  Recommend outpatient w/u for possible cirrhosis- possible nodularity on [place], not previously appreciated on CT scans  Esophagogastroduodenoscopy with possible biopsy, control of bleeding, polypectomy, and interventions as necessary has been discussed with the patient/patient representative. Informed consent was obtained from the patient/patient representative after explaining the indication, nature, and risks of the procedure including but not limited to death, bleeding, perforation, missed neoplasm/lesions, cardiorespiratory compromise, and reaction to medications. Opportunity for questions was given and appropriate answers were provided.  Patient/patient representative has verbalized understanding is amenable to undergoing the procedure.  I personally performed the service.  Management of other medical comorbidities as per primary team  Thank you for allowing [place] to participate in this patient's care. Please don't hesitate to call if any questions or concerns arise.   Annamaria Helling, DO [facility]  Portions of the record may have been created with voice recognition software. Occasional wrong-word or 'sound-a-like' substitutions may have occurred due to the inherent limitations of voice recognition software.  Read the chart carefully and recognize, using context, where substitutions may have occurred.

## 2022-03-24 NOTE — Transfer of Care (Signed)
Immediate Anesthesia Transfer of Care Note  Patient: Cody Price  Procedure(s) Performed: ESOPHAGOGASTRODUODENOSCOPY (EGD) WITH PROPOFOL  Patient Location: PACU  Anesthesia Type:General  Level of Consciousness: awake, alert  and oriented  Airway & Oxygen Therapy: Patient Spontanous Breathing  Post-op Assessment: Report given to RN and Post -op Vital signs reviewed and stable  Post vital signs: Reviewed and stable  Last Vitals:  Vitals Value Taken Time  BP    Temp    Pulse    Resp    SpO2      Last Pain:  Vitals:   03/24/22 1310  TempSrc: Temporal  PainSc: 0-No pain         Complications: No notable events documented.

## 2022-03-24 NOTE — Op Note (Addendum)
[facility] Patient Name: Cody Price Procedure Date: 03/24/2022 2:01 PM MRN: [id number] Account #: [id number] Date of Birth: 1940/11/21 Admit Type: Inpatient Age: 81 Room: [facility] ENDO ROOM 4 Gender: Male Note Status: Supervisor Override Instrument Name: Upper Endoscope [id number] Procedure:             Upper GI endoscopy Indications:           Iron deficiency anemia Providers:             Annamaria Helling DO, DO Medicines:             Monitored Anesthesia Care Complications:         No immediate complications. Estimated blood loss: None. Procedure:             Pre-Anesthesia Assessment:                        - Prior to the procedure, a History and Physical was                         performed, and patient medications and allergies were                         reviewed. The patient is competent. The risks and                         benefits of the procedure and the sedation options and                         risks were discussed with the patient. All questions                         were answered and informed consent was obtained.                         Patient identification and proposed procedure were                         verified by the physician, the nurse, the anesthetist                         and the technician in the endoscopy suite. Mental                         Status Examination: alert and oriented. Airway                         Examination: normal oropharyngeal airway and neck                         mobility. Respiratory Examination: clear to                         auscultation. CV Examination: regular rate and rhythm.                         Prophylactic Antibiotics: The patient does not require  prophylactic antibiotics. Prior Anticoagulants: The                         patient has taken Eliquis (apixaban), last dose was 2                         days prior to procedure. ASA Grade Assessment: III -  A                         patient with severe systemic disease. After reviewing                         the risks and benefits, the patient was deemed in                         satisfactory condition to undergo the procedure. The                         anesthesia plan was to use monitored anesthesia care                         (MAC). Immediately prior to administration of                         medications, the patient was re-assessed for adequacy                         to receive sedatives. The heart rate, respiratory                         rate, oxygen saturations, blood pressure, adequacy of                         pulmonary ventilation, and response to care were                         monitored throughout the procedure. The physical                         status of the patient was re-assessed after the                         procedure.                        After obtaining informed consent, the endoscope was                         passed under direct vision. Throughout the procedure,                         the patient's blood pressure, pulse, and oxygen                         saturations were monitored continuously. The Endoscope                         was introduced through the mouth, and advanced to the  second part of duodenum. The upper GI endoscopy was                         accomplished without difficulty. The patient tolerated                         the procedure well. Findings:      The duodenal bulb, first portion of the duodenum and second portion of       the duodenum were normal. Estimated blood loss: none.      A single 2 to 3 mm sessile polyp with no bleeding and no stigmata of       recent bleeding was found in the gastric fundus. Estimated blood loss:       none.      The Z-line was regular. Estimated blood loss: none.      Esophagogastric landmarks were identified: the gastroesophageal junction       was found at 37 cm from the  incisors.      The exam of the esophagus was otherwise normal. Impression:            - Normal duodenal bulb, first portion of the duodenum                         and second portion of the duodenum.                        - A single gastric polyp.                        - Z-line regular.                        - Esophagogastric landmarks identified.                        - No specimens collected. Recommendation:        - Return patient to hospital ward for ongoing care.                        - Resume previous diet.                        - Can resume heparin gtt                        - Use Protonix (pantoprazole) 40 mg PO daily.                        - The findings and recommendations were discussed with                         the referring physician.                        - The findings and recommendations were discussed with                         the patient.                        - The findings and recommendations  were discussed with                         the patient's primary physician.                        - Given the patient's low blood pressures during the                         procedure (SBP 90-100), discussion was held that we                         will not proceed with colonoscopy for safety reasons.                         Should bleeding occur (which has not been evident),                         can perform a tagged RBC scan (if hemodynamically                         stable) or CTA (if hemodynamically unstable), but only                         during active bleeding. Procedure Code(s):     --- Professional ---                        [phone], Esophagogastroduodenoscopy, flexible,                         transoral; diagnostic, including collection of                         specimen(s) by brushing or washing, when performed                         (separate procedure) Diagnosis Code(s):     --- Professional ---                        K31.7, Polyp of stomach  and duodenum                        D50.9, Iron deficiency anemia, unspecified CPT copyright 2019 [employer]. All rights reserved. The codes documented in this report are preliminary and upon coder review may  be revised to meet current compliance requirements. Attending Participation:      I personally performed the entire procedure. Volney American, DO Annamaria Helling DO, DO 03/24/2022 2:34:16 PM This report has been signed electronically. Number of Addenda: 0 Note Initiated On: 03/24/2022 2:01 PM Estimated Blood Loss:  Estimated blood loss: none.      [facility]

## 2022-03-24 NOTE — Plan of Care (Signed)
  Problem: Education: Goal: Knowledge of General Education information will improve Description: Including pain rating scale, medication(s)/side effects and non-pharmacologic comfort measures Outcome: Progressing   Problem: Health Behavior/Discharge Planning: Goal: Ability to manage health-related needs will improve Outcome: Progressing   Problem: Clinical Measurements: Goal: Ability to maintain clinical measurements within normal limits will improve Outcome: Progressing Goal: Will remain free from infection Outcome: Progressing Goal: Diagnostic test results will improve Outcome: Progressing Goal: Respiratory complications will improve Outcome: Progressing Goal: Cardiovascular complication will be avoided Outcome: Progressing   Problem: Coping: Goal: Level of anxiety will decrease Outcome: Progressing   Problem: Elimination: Goal: Will not experience complications related to urinary retention Outcome: Progressing   Problem: Pain Managment: Goal: General experience of comfort will improve Outcome: Progressing   Problem: Safety: Goal: Ability to remain free from injury will improve Outcome: Progressing   Problem: Activity: Goal: Risk for activity intolerance will decrease Outcome: Not Progressing   Problem: Nutrition: Goal: Adequate nutrition will be maintained Outcome: Not Progressing   Problem: Elimination: Goal: Will not experience complications related to bowel motility Outcome: Not Progressing   

## 2022-03-24 NOTE — Progress Notes (Signed)
Progress Note  Patient Name: Cody Price Date of Encounter: 03/24/2022  [facility] Cardiologist: Ida Rogue, MD   Subjective   Patient seen on AM rounds. He denies any chest pain or shortness of breath. Endorses back, neck, and foot pain. Has taken himself off of oxygen stating he feels as if he no longer needs it. Scheduled for EGD today and colonoscopy tomorrow. LHC remains on hold until GI work-up has been completed.  Hgb this morning is 7.8.  -1.5 L output in the last 24 hours.  Inpatient Medications    Scheduled Meds:  sodium chloride   Intravenous Once   aspirin EC  81 mg Oral Daily   atorvastatin  40 mg Oral Daily   [START ON 03/26/2022] cyanocobalamin  1,000 mcg Intramuscular Q30 days   finasteride  5 mg Oral Daily   furosemide  40 mg Intravenous BID   insulin aspart  0-5 Units Subcutaneous QHS   insulin aspart  0-9 Units Subcutaneous TID WC   lisinopril  5 mg Oral Daily   metoprolol succinate  50 mg Oral Daily   multivitamin with minerals  1 tablet Oral Daily   omega-3 acid ethyl esters  2 g Oral Daily   pantoprazole (PROTONIX) IV  40 mg Intravenous Q12H   tamsulosin  0.4 mg Oral Daily   Continuous Infusions:  PRN Meds: oxyCODONE **AND** acetaminophen, acetaminophen, dextromethorphan-guaiFENesin, diazepam, hydrALAZINE, ipratropium-albuterol, methocarbamol, morphine injection, ondansetron (ZOFRAN) IV   Vital Signs    Vitals:   03/23/22 2311 03/24/22 0423 03/24/22 0538 03/24/22 0742  BP: (!) 104/50 (!) 102/54  (!) 94/32  Pulse: 95 77  90  Resp: '16 16  19  '$ Temp: 97.6 F (36.4 C) 98.4 F (36.9 C)  98.2 F (36.8 C)  TempSrc:      SpO2: 99% 99%  95%  Weight:   110.9 kg     Intake/Output Summary (Last 24 hours) at 03/24/2022 0831 Last data filed at 03/24/2022 0400 Gross per 24 hour  Intake 480 ml  Output 2050 ml  Net -1570 ml      03/24/2022    5:38 AM 03/23/2022    4:37 AM 03/22/2022    2:43 PM  Last 3 Weights  Weight (lbs) 244 lb 7.8 oz 246  lb 7.6 oz 235 lb  Weight (kg) 110.9 kg 111.8 kg 106.595 kg      Telemetry     Rate controlled atrial fibrillation rates 80-90- Personally Reviewed  ECG    No new tracings - Personally Reviewed  Physical Exam   GEN: No acute distress.   Neck: No JVD Cardiac: irregularly irregular, no murmurs, rubs, or gallops.  Respiratory: Clear to auscultation bilaterally. Respirations are unlabored on room air GI: Soft, nontender, non-distended, bowel sounds present in all 4 quadrants MS: 1+ edema to bilateral lower extremities; No deformity. Neuro:  Nonfocal  Psych: Normal affect   Labs    High Sensitivity Troponin:   Recent Labs  Lab 03/22/22 1021 03/22/22 1227 03/22/22 1430 03/22/22 1820 03/22/22 2252  TROPONINIHS 69* 72* 66* 62* 72*     Chemistry Recent Labs  Lab 03/22/22 1021 03/23/22 0829 03/24/22 0439  NA 135 136 137  K 4.5 3.8 3.8  CL 106 106 108  CO2 21* 24 25  GLUCOSE 249* 133* 117*  BUN '21 18 17  '$ CREATININE 1.15 0.92 0.90  CALCIUM 8.5* 8.1* 8.0*  GFRNONAA >60 >60 >60  ANIONGAP 8 6 4*    Lipids  Recent Labs  Lab 03/22/22 1025  CHOL 62  TRIG 75  HDL 20*  LDLCALC 27  CHOLHDL 3.1    Hematology Recent Labs  Lab 03/22/22 2252 03/23/22 0727 03/24/22 0439  WBC 10.1 8.8 8.7  RBC 2.81* 2.74* 2.63*  HGB 8.5* 8.3* 7.8*  HCT 24.3* 23.8* 23.1*  MCV 86.5 86.9 87.8  MCH 30.2 30.3 29.7  MCHC 35.0 34.9 33.8  RDW 17.3* 17.9* 18.0*  PLT 62* 63* 59*   Thyroid No results for input(s): "TSH", "FREET4" in the last 168 hours.  BNP Recent Labs  Lab 03/22/22 1015  BNP 431.0*    DDimer No results for input(s): "DDIMER" in the last 168 hours.   Radiology     Cardiac Studies  Echocardiogram completed 03/23/2022 1. Left ventricular ejection fraction, by estimation, is 50 to 55%. The  left ventricle has low normal function. The left ventricle demonstrates  regional wall motion abnormalities (see scoring diagram/findings for  description). There is moderate  left  ventricular hypertrophy. Left ventricular diastolic function could not be  evaluated. There is mild hypokinesis of the left ventricular, basal  inferior and inferolateral segments.   2. Right ventricular systolic function is normal. The right ventricular  size is mildly enlarged. There is moderately elevated pulmonary artery  systolic pressure. The estimated right ventricular systolic pressure is  82.5 mmHg.   3. Left atrial size was mildly dilated.   4. Right atrial size was mildly dilated.   5. The mitral valve is abnormal. Mild to moderate mitral valve  regurgitation.   6. Tricuspid valve regurgitation is mild to moderate.   7. The aortic valve is tricuspid. There is mild thickening of the aortic  valve. Aortic valve regurgitation is not visualized. Aortic valve  sclerosis is present, with no evidence of aortic valve stenosis.   8. Aortic dilatation noted. There is borderline dilatation of the aortic  root and of the ascending aorta, measuring 38 mm.   9. The inferior vena cava is dilated in size with <50% respiratory  variability, suggesting right atrial pressure of 15 mmHg.   Patient Profile     81 y.o. male with a past medical history of coronary artery disease s/p multiple PCI's, chronic diastolic heart failure, permanent atrial fibrillation, DM II, tobacco use, essential hypertension, and hyperlipidemia who is being seen and evaluated for chest pain with possible unstable angina.  Assessment & Plan    NSTEMI -patient remains chest pain free today -previous symptoms were similar to his previous prior myocardial infarction -Hs troponins 72/66/62/72 -EKG showed poor R wave progression in anterior leads -continue heparin drip after GI procedures -LHC on hold until GI work-up has been completed -continue cardiac monitor -continue ASA -EKG for pain or changes  Acute on chronic diastolic heart failure - -1.5L output in the last 24 hours -LVEF 50-55%, with subtle basal  inferior/inferolateral hypokinesis, moderate pulmonary hypertension -oral lasix changed to IV lasix 40 mg IV BID -daily BMP while on diuretics  -continue metoprolol, lisinopril -daily weights, I&O, low sodium diet  Permanent atrial fibrillation -eliquis is currently on hold, patient was placed on heparin drip -heparin drip off for EGD, will need to be restarted after EGD if ok with GI -rate controlled -continue metoprolol  Worsening anemia -hgb 7.8 down from yesterday  -scheduled for EGD with GI today and colonoscopy tomorrow -ordered for blood transfusion of 1 unit today -recommend keeping hgb 8 or better with cardiac history -daily cbc  Essential hypertension -blood pressure soft this morning at 102/54 -  continue lisinopril and metoprolol   Hyperlipidemia -LDL 27 on 03/22/22 -continue atorvastatin 40 mg daily and lovaza 2g daily     For questions or updates, please contact [facility] Please consult [email or web address] for contact info under        Signed, Reynold Mantell, NP  03/24/2022, 8:31 AM

## 2022-03-24 NOTE — Progress Notes (Signed)
Inpatient Follow-up/Progress Note   Patient ID: Cody Price is a 81 y.o. male.  Overnight Events / Subjective Findings Pt feels he is no longer needs oxygen supplementation. Receiving 1 u prbc today since hgb was 7.8. NPO since midnight for EGD today. No other acute gi complaints.  Review of Systems  Constitutional:  Positive for activity change. Negative for appetite change, chills, diaphoresis, fatigue, fever and unexpected weight change.  HENT:  Negative for trouble swallowing and voice change.   Respiratory:  Positive for shortness of breath. Negative for wheezing.   Cardiovascular:  Positive for chest pain and leg swelling. Negative for palpitations.  Gastrointestinal:  Negative for abdominal distention, abdominal pain, anal bleeding, blood in stool, constipation, diarrhea, nausea and vomiting.  Musculoskeletal:  Negative for arthralgias and myalgias.  Skin:  Negative for color change and pallor.  Neurological:  Negative for dizziness, syncope and weakness.  Psychiatric/Behavioral:  Negative for confusion. The patient is not nervous/anxious.   All other systems reviewed and are negative.    Medications  Current Facility-Administered Medications:    0.9 %  sodium chloride infusion (Manually program via Guardrails IV Fluids), , Intravenous, Once, Jennye Boroughs, MD   oxyCODONE (Oxy IR/ROXICODONE) immediate release tablet 10 mg, 10 mg, Oral, Q4H PRN, 10 mg at 03/24/22 0815 **AND** acetaminophen (TYLENOL) tablet 325 mg, 325 mg, Oral, Q6H PRN, Darrick Penna, RPH, 325 mg at 03/24/22 0815   acetaminophen (TYLENOL) tablet 650 mg, 650 mg, Oral, Q6H PRN, Ivor Costa, MD   aspirin EC tablet 81 mg, 81 mg, Oral, Daily, Ivor Costa, MD, 81 mg at 03/24/22 0816   atorvastatin (LIPITOR) tablet 40 mg, 40 mg, Oral, Daily, Ivor Costa, MD, 40 mg at 03/23/22 1742   [START ON 03/26/2022] cyanocobalamin ((VITAMIN B-12)) injection 1,000 mcg, 1,000 mcg, Intramuscular, Q30 days, Ivor Costa, MD    dextromethorphan-guaiFENesin [facility] DM) 30-600 MG per 12 hr tablet 1 tablet, 1 tablet, Oral, BID PRN, Ivor Costa, MD   diazepam (VALIUM) tablet 5 mg, 5 mg, Oral, TID PRN, Ivor Costa, MD, 5 mg at 03/23/22 2311   finasteride (PROSCAR) tablet 5 mg, 5 mg, Oral, Daily, Ivor Costa, MD, 5 mg at 03/24/22 0816   furosemide (LASIX) injection 40 mg, 40 mg, Intravenous, BID, End, Christopher, MD, 40 mg at 03/24/22 0818   hydrALAZINE (APRESOLINE) injection 5 mg, 5 mg, Intravenous, Q2H PRN, Ivor Costa, MD   insulin aspart (novoLOG) injection 0-5 Units, 0-5 Units, Subcutaneous, QHS, Niu, Xilin, MD   insulin aspart (novoLOG) injection 0-9 Units, 0-9 Units, Subcutaneous, TID WC, Ivor Costa, MD, 2 Units at 03/23/22 1741   ipratropium-albuterol (DUONEB) 0.5-2.5 (3) MG/3ML nebulizer solution 3 mL, 3 mL, Nebulization, Q4H PRN, Wyvonnia Dusky, MD   lisinopril (ZESTRIL) tablet 5 mg, 5 mg, Oral, Daily, Agbor-Etang, Brian, MD, 5 mg at 03/24/22 0817   methocarbamol (ROBAXIN) tablet 500 mg, 500 mg, Oral, Q8H PRN, Ivor Costa, MD   metoprolol succinate (TOPROL-XL) 24 hr tablet 50 mg, 50 mg, Oral, Daily, Ivor Costa, MD, 50 mg at 03/24/22 0816   morphine (PF) 2 MG/ML injection 2 mg, 2 mg, Intravenous, Q4H PRN, Ivor Costa, MD, 2 mg at 03/23/22 1253   multivitamin with minerals tablet 1 tablet, 1 tablet, Oral, Daily, Ivor Costa, MD, 1 tablet at 03/24/22 0816   omega-3 acid ethyl esters (LOVAZA) capsule 2 g, 2 g, Oral, Daily, Ivor Costa, MD, 2 g at 03/24/22 0817   ondansetron (ZOFRAN) injection 4 mg, 4 mg, Intravenous, Q8H PRN, Blaine Hamper,  Soledad Gerlach, MD   pantoprazole (PROTONIX) injection 40 mg, 40 mg, Intravenous, Q12H, Annamaria Helling, DO, 40 mg at 03/24/22 0817   tamsulosin (FLOMAX) capsule 0.4 mg, 0.4 mg, Oral, Daily, Ivor Costa, MD, 0.4 mg at 03/24/22 0816    oxyCODONE **AND** acetaminophen, acetaminophen, dextromethorphan-guaiFENesin, diazepam, hydrALAZINE, ipratropium-albuterol, methocarbamol, morphine injection,  ondansetron (ZOFRAN) IV   Objective    Vitals:   03/23/22 2311 03/24/22 0423 03/24/22 0538 03/24/22 0742  BP: (!) 104/50 (!) 102/54  (!) 94/32  Pulse: 95 77  90  Resp: '16 16  19  '$ Temp: 97.6 F (36.4 C) 98.4 F (36.9 C)  98.2 F (36.8 C)  TempSrc:      SpO2: 99% 99%  95%  Weight:   110.9 kg      Physical Exam Vitals and nursing note reviewed.  Constitutional:      General: He is not in acute distress.    Appearance: He is obese. He is not ill-appearing, toxic-appearing or diaphoretic.  HENT:     Head: Normocephalic and atraumatic.     Nose: Nose normal.     Mouth/Throat:     Mouth: Mucous membranes are moist.     Pharynx: Oropharynx is clear.  Eyes:     General: No scleral icterus.    Extraocular Movements: Extraocular movements intact.  Cardiovascular:     Rate and Rhythm: Normal rate and regular rhythm.     Heart sounds: Normal heart sounds. No murmur heard.    No friction rub. No gallop.  Pulmonary:     Breath sounds: No wheezing, rhonchi or rales.     Comments: Diminished bilaterally Abdominal:     General: Bowel sounds are normal. There is no distension.     Palpations: Abdomen is soft.     Tenderness: There is no abdominal tenderness. There is no guarding or rebound.  Musculoskeletal:     Cervical back: Neck supple.     Right lower leg: Edema present.     Left lower leg: Edema present.  Skin:    General: Skin is warm and dry.     Coloration: Skin is not jaundiced or pale.     Comments: Facial telangiectasias  Neurological:     General: No focal deficit present.     Mental Status: He is alert and oriented to person, place, and time. Mental status is at baseline.  Psychiatric:        Mood and Affect: Mood normal.        Behavior: Behavior normal.        Thought Content: Thought content normal.        Judgment: Judgment normal.      Laboratory Data Recent Labs  Lab 03/22/22 2252 03/23/22 0727 03/24/22 0439  WBC 10.1 8.8 8.7  HGB 8.5* 8.3* 7.8*   HCT 24.3* 23.8* 23.1*  PLT 62* 63* 59*    Recent Labs  Lab 03/22/22 1021 03/23/22 0829 03/24/22 0439  NA 135 136 137  K 4.5 3.8 3.8  CL 106 106 108  CO2 21* 24 25  BUN '21 18 17  '$ CREATININE 1.15 0.92 0.90  CALCIUM 8.5* 8.1* 8.0*  GLUCOSE 249* 133* 117*    Recent Labs  Lab 03/22/22 1430  INR 2.0*       Imaging Studies: ECHOCARDIOGRAM COMPLETE  Result Date: 03/23/2022    ECHOCARDIOGRAM REPORT   Patient Name:   Cherlyn Labella Date of Exam: 03/23/2022 Medical Rec #:  [id number]      Height:  74.0 in Accession #:    [id number]     Weight:       246.5 lb Date of Birth:  [birth year]       BSA:          2.375 m Patient Age:    69 years       BP:           105/57 mmHg Patient Gender: M              HR:           83 bpm. Exam Location:  [facility] Procedure: 2D Echo, Cardiac Doppler, Color Doppler and Intracardiac            Opacification Agent Indications:     Elevated troponin  History:         Patient has prior history of Echocardiogram examinations, most                  recent 08/24/2021. CHF, CAD and Previous Myocardial Infarction,                  Coronary stent, Arrythmias:Atrial Fibrillation; Risk                  Factors:Former Smoker, Diabetes, Hypertension and Dyslipidemia.  Sonographer:     Rosalia Hammers Referring Phys:  [id number] VELFYBOF A ARIDA Diagnosing Phys: Nelva Bush MD  Sonographer Comments: Image acquisition challenging due to patient body habitus. IMPRESSIONS  1. Left ventricular ejection fraction, by estimation, is 50 to 55%. The left ventricle has low normal function. The left ventricle demonstrates regional wall motion abnormalities (see scoring diagram/findings for description). There is moderate left ventricular hypertrophy. Left ventricular diastolic function could not be evaluated. There is mild hypokinesis of the left ventricular, basal inferior and inferolateral segments.  2. Right ventricular systolic function is normal. The right ventricular size is mildly enlarged.  There is moderately elevated pulmonary artery systolic pressure. The estimated right ventricular systolic pressure is 75.1 mmHg.  3. Left atrial size was mildly dilated.  4. Right atrial size was mildly dilated.  5. The mitral valve is abnormal. Mild to moderate mitral valve regurgitation.  6. Tricuspid valve regurgitation is mild to moderate.  7. The aortic valve is tricuspid. There is mild thickening of the aortic valve. Aortic valve regurgitation is not visualized. Aortic valve sclerosis is present, with no evidence of aortic valve stenosis.  8. Aortic dilatation noted. There is borderline dilatation of the aortic root and of the ascending aorta, measuring 38 mm.  9. The inferior vena cava is dilated in size with <50% respiratory variability, suggesting right atrial pressure of 15 mmHg. FINDINGS  Left Ventricle: Left ventricular ejection fraction, by estimation, is 50 to 55%. The left ventricle has low normal function. The left ventricle demonstrates regional wall motion abnormalities. Mild hypokinesis of the left ventricular, basal inferior and  inferolateral segments. Definity contrast agent was given IV to delineate the left ventricular endocardial borders. The left ventricular internal cavity size was normal in size. There is moderate left ventricular hypertrophy. Left ventricular diastolic function could not be evaluated due to atrial fibrillation. Left ventricular diastolic function could not be evaluated. Right Ventricle: The right ventricular size is mildly enlarged. No increase in right ventricular wall thickness. Right ventricular systolic function is normal. There is moderately elevated pulmonary artery systolic pressure. The tricuspid regurgitant velocity is 3.12 m/s, and with an assumed right atrial pressure of 15 mmHg, the estimated right ventricular systolic pressure  is 53.9 mmHg. Left Atrium: Left atrial size was mildly dilated. Right Atrium: Right atrial size was mildly dilated. Pericardium:  There is no evidence of pericardial effusion. Mitral Valve: The mitral valve is abnormal. There is mild thickening of the mitral valve leaflet(s). Mild to moderate mitral valve regurgitation. Tricuspid Valve: The tricuspid valve is normal in structure. Tricuspid valve regurgitation is mild to moderate. Aortic Valve: The aortic valve is tricuspid. There is mild thickening of the aortic valve. There is mild aortic valve annular calcification. Aortic valve regurgitation is not visualized. Aortic valve sclerosis is present, with no evidence of aortic valve  stenosis. Aortic valve mean gradient measures 4.0 mmHg. Aortic valve peak gradient measures 8.3 mmHg. Aortic valve area, by VTI measures 2.12 cm. Pulmonic Valve: The pulmonic valve was normal in structure. Pulmonic valve regurgitation is mild. Aorta: Aortic dilatation noted. There is borderline dilatation of the aortic root and of the ascending aorta, measuring 38 mm. Pulmonary Artery: The pulmonary artery is of normal size. Venous: The inferior vena cava is dilated in size with less than 50% respiratory variability, suggesting right atrial pressure of 15 mmHg. IAS/Shunts: No atrial level shunt detected by color flow Doppler.  LEFT VENTRICLE PLAX 2D LVIDd:         4.76 cm LVIDs:         4.03 cm LV PW:         1.51 cm LV IVS:        1.64 cm LVOT diam:     1.90 cm LV SV:         58 LV SV Index:   25 LVOT Area:     2.84 cm  RIGHT VENTRICLE RV Basal diam:  5.18 cm TAPSE (M-mode): 2.5 cm LEFT ATRIUM              Index        RIGHT ATRIUM           Index LA diam:        4.30 cm  1.81 cm/m   RA Area:     23.50 cm LA Vol (A2C):   111.0 ml 46.73 ml/m  RA Volume:   73.10 ml  30.78 ml/m LA Vol (A4C):   70.2 ml  29.55 ml/m LA Biplane Vol: 88.3 ml  37.17 ml/m  AORTIC VALVE AV Area (Vmax):    1.95 cm AV Area (Vmean):   1.81 cm AV Area (VTI):     2.12 cm AV Vmax:           144.00 cm/s AV Vmean:          96.800 cm/s AV VTI:            0.275 m AV Peak Grad:      8.3 mmHg  AV Mean Grad:      4.0 mmHg LVOT Vmax:         99.00 cm/s LVOT Vmean:        61.900 cm/s LVOT VTI:          0.206 m LVOT/AV VTI ratio: 0.75  AORTA Ao Root diam: 3.80 cm MITRAL VALVE                TRICUSPID VALVE MV Area (PHT): 2.83 cm     TR Peak grad:   38.9 mmHg MV Decel Time: 268 msec     TR Vmax:        312.00 cm/s MV E velocity: 114.00 cm/s  SHUNTS                             Systemic VTI:  0.21 m                             Systemic Diam: 1.90 cm Nelva Bush MD Electronically signed by Nelva Bush MD Signature Date/Time: 03/23/2022/4:26:17 PM    Final    US Abdomen Complete  Result Date: 03/22/2022 CLINICAL DATA:  Assess for cirrhosis and splenomegaly. EXAM: ABDOMEN ULTRASOUND COMPLETE COMPARISON:  Abdominopelvic CT 05/30/2019. FINDINGS: Gallbladder: Surgically absent. Common bile duct: Diameter: 7 mm, normal for cholecystectomy state. Liver: There may be subtle capsular nodularity of the left lobe. No focal lesion identified. Within normal limits in parenchymal echogenicity. Portal vein is patent on color Doppler imaging with normal direction of blood flow towards the liver. IVC: No abnormality visualized. Pancreas: The majority is obscured, visualized portion unremarkable. Spleen: Enlarged measuring 13.4 x 7.7 x 16.4 cm, volume is 885 cc. Splenic volume has decreased from prior abdominal CT. Right Kidney: Length: 12 cm. 2.4 cm cyst arises from the upper pole. This is benign and needs no further follow-up. Normal parenchymal echogenicity. No hydronephrosis. No renal calculi. Left Kidney: Length: 12.3 cm. Normal parenchymal echogenicity. No hydronephrosis. No visualized stone or focal lesion. Abdominal aorta: No aneurysm visualized. The mid and distal aorta are obscured by overlying bowel gas. Other findings: No abdominal ascites. IMPRESSION: 1. Chronic splenomegaly, although splenic volume has diminished from 2020 CT. 2. Equivocal subtle capsular nodularity of the left  lobe of the liver. There are no other findings of cirrhosis, no focal hepatic lesion. 3. Cholecystectomy without biliary dilatation. Electronically Signed   By: Keith Rake M.D.   On: 03/22/2022 17:43    Assessment:   # Acute on chronic anemia - stool brown on exam on presentation - no further signs of gib - iron sat 13%, ferritin elevated at 229 - has rcd 2 u prbc during hospitalization  # Chest pain and DOE  # Afib on Eliquis  # CAD s/p PCI/stent  # Splenomegaly  # Abnormal Contour to liver on [place] - no h/o cirrhosis  # Thrombocytopenia- chronic  # HFpEF  # diverticulosis # phx colon polyps  Plan:  Pt received 1 u prbc today for hgb 7.8 No further signs of GIB Plan for EGD today, npo since midnight Heparin gtt held since this morning Plan for colonoscopy tomorrow. Will provide go lytely prep. NPO at midnight. Will need heparin gtt held again. Prep ok to drink after npo order.  Protonix 40 mg iv q12 h Eliquis on hold. Would need 48 hour washout prior to any endoscopic procedure (last dose morning of 7/17) Monitor H&H.  Transfusion and resuscitation as per primary team  Avoid frequent lab draws to prevent lab induced anemia Supportive care and antiemetics as per primary team Maintain two sites IV access Avoid nsaids Monitor for GIB.  Recommend outpatient w/u for possible cirrhosis- possible nodularity on [place], not previously appreciated on CT scans  Esophagogastroduodenoscopy with possible biopsy, control of bleeding, polypectomy, and interventions as necessary has been discussed with the patient/patient representative. Informed consent was obtained from the patient/patient representative after explaining the indication, nature, and risks of the procedure including but not limited to death, bleeding, perforation, missed neoplasm/lesions, cardiorespiratory compromise, and reaction to medications. Opportunity for questions was given and appropriate answers were provided.  Patient/patient representative has verbalized understanding is amenable to undergoing the procedure.  I personally performed the service.  Management of other medical comorbidities as per primary team  Thank you for allowing [place] to participate in this patient's care. Please don't hesitate to call if any questions or concerns arise.   Annamaria Helling, DO [facility]  Portions of the record may have been created with voice recognition software. Occasional wrong-word or 'sound-a-like' substitutions may have occurred due to the inherent limitations of voice recognition software.  Read the chart carefully and recognize, using context, where substitutions may have occurred.

## 2022-03-24 NOTE — Anesthesia Postprocedure Evaluation (Signed)
Anesthesia Post Note  Patient: Cody Price  Procedure(s) Performed: ESOPHAGOGASTRODUODENOSCOPY (EGD) WITH PROPOFOL  Patient location during evaluation: Endoscopy Anesthesia Type: General Level of consciousness: awake and alert Pain management: pain level controlled Vital Signs Assessment: post-procedure vital signs reviewed and stable Respiratory status: spontaneous breathing, nonlabored ventilation, respiratory function stable and patient connected to nasal cannula oxygen Cardiovascular status: blood pressure returned to baseline and stable Postop Assessment: no apparent nausea or vomiting Anesthetic complications: no   No notable events documented.   Last Vitals:  Vitals:   03/24/22 1426 03/24/22 1436  BP: (!) 111/56 (!) 103/52  Pulse: 98 91  Resp: (!) 21 15  Temp: (!) 36.1 C   SpO2: 96% 100%    Last Pain:  Vitals:   03/24/22 1436  TempSrc:   PainSc: 0-No pain                 Dimas Millin

## 2022-03-24 NOTE — Anesthesia Preprocedure Evaluation (Signed)
Anesthesia Evaluation  Patient identified by MRN, date of birth, ID band Patient awake    Reviewed: Allergy & Precautions, NPO status , Patient's Chart, lab work & pertinent test results  History of Anesthesia Complications Negative for: history of anesthetic complications  Airway Mallampati: III  TM Distance: >3 FB Neck ROM: full    Dental  (+) Chipped   Pulmonary sleep apnea , former smoker,    Pulmonary exam normal        Cardiovascular hypertension, + angina + CAD and + Past MI  negative cardio ROS Normal cardiovascular exam(-) dysrhythmias      Neuro/Psych PSYCHIATRIC DISORDERS negative neurological ROS     GI/Hepatic negative GI ROS, Neg liver ROS, GERD  ,  Endo/Other  negative endocrine ROSdiabetes  Renal/GU negative Renal ROS  negative genitourinary   Musculoskeletal  (+) Arthritis , Osteoarthritis,    Abdominal Normal abdominal exam  (+)   Peds negative pediatric ROS (+)  Hematology negative hematology ROS (+) anemia ,   Anesthesia Other Findings PMH of NSTEMI on 03/22/22. Patient denies feeling any chest pain, shortness of breath.   Past Medical History: No date: Arthritis     Comment:  degenerative back  No date: BPH with obstruction/lower urinary tract symptoms 12/03/08: CAD (coronary artery disease)     Comment:  stent No date: Diabetes mellitus No date: ED (erectile dysfunction) No date: Elevated PSA No date: GERD (gastroesophageal reflux disease) No date: Gross hematuria No date: H/O CT scan     Comment:  recent renal CT due to hematuria, cystoscopy - normal   No date: Hematuria No date: History of hiatal hernia No date: History of kidney stones No date: HLD (hyperlipidemia) No date: Hypertension 1991: Ischemic heart disease     Comment:  with angioplasty No date: Kidney stone No date: Morbid obesity ([place]) No date: Myocardial infarction ([place]) No date: Peripheral neuropathy No date:  Pernicious anemia 1995: Pneumonia     Comment:  /w PE,  No date: Pulmonary embolism ([facility])     Comment:  previous No date: Renal cyst No date: Sleep apnea     Comment:  pt. denies sleep apnea, pt. states he has had 2 studies               - last one December 03, 2009 No date: Spinal stenosis  Past Surgical History: No date: APPENDECTOMY No date: BACK SURGERY     Comment:  x3 back surgeries - /w fusioin, 1- [facility] 2- [facility] No date: bilateral inguinal hernia repair No date: cad     Comment:  stent 03-Dec-2008 08/17/2014: CARDIAC CATHETERIZATION 12-03-2012: CARPAL TUNNEL RELEASE     Comment:  bilateral  No date: CHOLECYSTECTOMY 07/11/2019: COLONOSCOPY WITH PROPOFOL; N/A     Comment:  Procedure: COLONOSCOPY WITH PROPOFOL;  Surgeon: [place],               Benay Pike, MD;  Location: [facility] ENDOSCOPY;  Service:               Gastroenterology;  Laterality: N/A; 07/11/2020: CORONARY BALLOON ANGIOPLASTY; N/A     Comment:  Procedure: CORONARY BALLOON ANGIOPLASTY;  Surgeon:               Isaias Cowman, MD;  Location: [place] [facility]               LAB;  Service: Cardiovascular;  Laterality: N/A; 06/07/2018: CORONARY STENT INTERVENTION; N/A     Comment:  Procedure: CORONARY STENT INTERVENTION;  Surgeon: End,  Harrell Gave, MD;  Location: [place] [facility] LAB;                Service: Cardiovascular;  Laterality: N/A; 07/11/2020: CORONARY STENT INTERVENTION; N/A     Comment:  Procedure: CORONARY STENT INTERVENTION;  Surgeon:               Isaias Cowman, MD;  Location: [place] [facility]               LAB;  Service: Cardiovascular;  Laterality: N/A; 07/11/2019: ESOPHAGOGASTRODUODENOSCOPY (EGD) WITH PROPOFOL; N/A     Comment:  Procedure: ESOPHAGOGASTRODUODENOSCOPY (EGD) WITH               PROPOFOL;  Surgeon: [place], Benay Pike, MD;  Location:               [facility] ENDOSCOPY;  Service: Gastroenterology;  Laterality:               N/A; No date: LAMINECTOMY 06/07/2018: LEFT HEART CATH AND CORONARY  ANGIOGRAPHY; N/A     Comment:  Procedure: LEFT HEART CATH AND CORONARY ANGIOGRAPHY;                Surgeon: Nelva Bush, MD;  Location: [place]               CV LAB;  Service: Cardiovascular;  Laterality: N/A; 08/26/2021: LEFT HEART CATH AND CORONARY ANGIOGRAPHY; N/A     Comment:  Procedure: LEFT HEART CATH AND CORONARY ANGIOGRAPHY;                Surgeon: Wellington Hampshire, MD;  Location: [place]               CV LAB;  Service: Cardiovascular;  Laterality: N/A; 07/11/2020: RIGHT/LEFT HEART CATH AND CORONARY ANGIOGRAPHY; N/A     Comment:  Procedure: RIGHT/LEFT HEART CATH AND CORONARY               ANGIOGRAPHY;  Surgeon: Minna Merritts, MD;  Location:               [place] [facility] LAB;  Service: Cardiovascular;                Laterality: N/A;  BMI    Body Mass Index: 31.39 kg/m      Reproductive/Obstetrics negative OB ROS                             Anesthesia Physical  Anesthesia Plan  ASA: 3  Anesthesia Plan: General   Post-op Pain Management: Minimal or no pain anticipated   Induction: Intravenous  PONV Risk Score and Plan: 3 and Propofol infusion, TIVA and Ondansetron  Airway Management Planned: Nasal Cannula  Additional Equipment: None  Intra-op Plan:   Post-operative Plan:   Informed Consent: I have reviewed the patients History and Physical, chart, labs and discussed the procedure including the risks, benefits and alternatives for the proposed anesthesia with the patient or authorized representative who has indicated his/her understanding and acceptance.     Dental advisory given  Plan Discussed with: CRNA and Surgeon  Anesthesia Plan Comments: (Discussed risks of anesthesia with patient, including possibility of difficulty with spontaneous ventilation under anesthesia necessitating airway intervention, PONV, and rare risks such as cardiac or respiratory or neurological events, and allergic reactions. Discussed the  role of CRNA in patient's perioperative care. Patient understands.)        Anesthesia Quick Evaluation

## 2022-03-24 NOTE — Progress Notes (Signed)
Progress Note    SACHIT GILMAN  KKX:[id number] DOB: 06/09/1941  DOA: 03/22/2022 PCP: Baxter Hire, MD      Brief Narrative:    Medical records reviewed and are as summarized below:  TERIQUE KAWABATA is a 81 y.o. male with medical history significant of CAD with stent, hypertension, hyperlipidemia, diabetes mellitus, atrial fibrillation and PE on Eliquis, BPH, kidney stone, thrombocytopenia, peptic ulcer disease, chronic diastolic CHF, who presented to the hospital with intermittent chest pain, exertional shortness of breath.  He also reported dark stools which she attributed to some juice.      Assessment/Plan:   Principal Problem:   Chest pain Active Problems:   CAD (coronary artery disease)   Chronic diastolic CHF (congestive heart failure) (HCC)   Atrial fibrillation, chronic (HCC)   Hx of pulmonary embolus   Diabetes mellitus without complication (HCC)   Thrombocytopenia (HCC)   BPH with obstruction/lower urinary tract symptoms   Normocytic anemia   Hypertension   HLD (hyperlipidemia)   Anxiety   Acute on chronic heart failure with preserved ejection fraction (HFpEF) (HCC)    Body mass index is 31.39 kg/m.  (Obesity)  Iron deficiency anemia, symptomatic anemia: Transfuse 1 unit of PRBCs.  Monitor H&H.  S/p transfusion with 1 unit of PRBCs on 03/22/2022.  No overt GI bleeding.  S/p EGD on 03/24/2022 which showed single gastric polyp.  No plan for colonoscopy per Dr. Virgina Jock, gastroenterologist.  Patient's blood pressure dropped during EGD requiring Neo-Synephrine..  Elevated troponin, CAD with PCI to LAD (CTO dRCA, 80% distal LAD, 50% prox Lcx): No NSTEMI per cardiologist.  No plans for cardiac cath at this time.  Chronic atrial fibrillation: Continue Toprol-XL.  Restart IV heparin per cardiologist.  Acute on chronic diastolic CHF: Improved.  Continue IV Lasix.  2D echo in 03/23/2022 showed EF estimated at 50 to 55%, moderate LVH, moderate to elevated pulmonary  artery systolic pressure, mild to moderate MR, mild to moderate TR.  History of pulmonary embolism: Restart IV heparin per cardiologist   Diet Order             Diet Heart Room service appropriate? Yes; Fluid consistency: Thin  Diet effective now                            Consultants: Cardiologist Gastroenterologist  Procedures: EGD on 03/24/2022    Medications:    sodium chloride   Intravenous Once   aspirin EC  81 mg Oral Daily   atorvastatin  40 mg Oral Daily   [START ON 03/26/2022] cyanocobalamin  1,000 mcg Intramuscular Q30 days   finasteride  5 mg Oral Daily   furosemide  40 mg Intravenous BID   insulin aspart  0-5 Units Subcutaneous QHS   insulin aspart  0-9 Units Subcutaneous TID WC   metoprolol succinate  50 mg Oral Daily   multivitamin with minerals  1 tablet Oral Daily   omega-3 acid ethyl esters  2 g Oral Daily   pantoprazole (PROTONIX) IV  40 mg Intravenous Q12H   tamsulosin  0.4 mg Oral Daily   Continuous Infusions:  heparin       Anti-infectives (From admission, onward)    None              Family Communication/Anticipated D/C date and plan/Code Status   DVT prophylaxis:      Code Status: Full Code  Family Communication: None Disposition Plan:  Plan to discharge home in 1 to 2 days   Status is: Inpatient Remains inpatient appropriate because: Symptomatic anemia       Subjective:   Interval events noted.  He complains of nausea, generalized weakness and fatigue.  No shortness of breath or chest pain.  Objective:    Vitals:   03/24/22 1310 03/24/22 1426 03/24/22 1436 03/24/22 1446  BP: 108/67 (!) 111/56 (!) 103/52 108/62  Pulse: 80 98 91 82  Resp: (!) 21 (!) 21 15 (!) 22  Temp: (!) 97.2 F (36.2 C) (!) 97 F (36.1 C)    TempSrc: Temporal     SpO2: 96% 96% 100% 100%  Weight:       No data found.   Intake/Output Summary (Last 24 hours) at 03/24/2022 1551 Last data filed at 03/24/2022 0815 Gross per  24 hour  Intake 240 ml  Output 1800 ml  Net -1560 ml   Filed Weights   03/22/22 1443 03/23/22 0437 03/24/22 0538  Weight: 106.6 kg 111.8 kg 110.9 kg    Exam:  GEN: NAD SKIN: No rash EYES: EOMI ENT: MMM CV: RRR PULM: CTA B ABD: soft, obese, NT, +BS CNS: AAO x 3, non focal EXT: No edema or tenderness        Data Reviewed:   I have personally reviewed following labs and imaging studies:  Labs: Labs show the following:   Basic Metabolic Panel: Recent Labs  Lab 03/22/22 1021 03/23/22 0829 03/24/22 0439  NA 135 136 137  K 4.5 3.8 3.8  CL 106 106 108  CO2 21* 24 25  GLUCOSE 249* 133* 117*  BUN '21 18 17  '$ CREATININE 1.15 0.92 0.90  CALCIUM 8.5* 8.1* 8.0*   GFR Estimated Creatinine Clearance: 86.8 mL/min (by C-G formula based on SCr of 0.9 mg/dL). Liver Function Tests: No results for input(s): "AST", "ALT", "ALKPHOS", "BILITOT", "PROT", "ALBUMIN" in the last 168 hours. No results for input(s): "LIPASE", "AMYLASE" in the last 168 hours. No results for input(s): "AMMONIA" in the last 168 hours. Coagulation profile Recent Labs  Lab 03/22/22 1430  INR 2.0*    CBC: Recent Labs  Lab 03/22/22 1021 03/22/22 1820 03/22/22 2252 03/23/22 0727 03/24/22 0439  WBC 9.4 9.6 10.1 8.8 8.7  HGB 8.1* 7.7* 8.5* 8.3* 7.8*  HCT 24.6* 23.1* 24.3* 23.8* 23.1*  MCV 90.1 89.9 86.5 86.9 87.8  PLT 64* 69* 62* 63* 59*   Cardiac Enzymes: No results for input(s): "CKTOTAL", "CKMB", "CKMBINDEX", "TROPONINI" in the last 168 hours. BNP (last 3 results) No results for input(s): "PROBNP" in the last 8760 hours. CBG: Recent Labs  Lab 03/23/22 1622 03/23/22 2023 03/24/22 0727 03/24/22 1203 03/24/22 1538  GLUCAP 152* 173* 103* 129* 148*   D-Dimer: No results for input(s): "DDIMER" in the last 72 hours. Hgb A1c: Recent Labs    03/22/22 1430  HGBA1C 7.6*   Lipid Profile: Recent Labs    03/22/22 1025  CHOL 62  HDL 20*  LDLCALC 27  TRIG 75  CHOLHDL 3.1   Thyroid  function studies: No results for input(s): "TSH", "T4TOTAL", "T3FREE", "THYROIDAB" in the last 72 hours.  Invalid input(s): "FREET3" Anemia work up: Recent Labs    03/22/22 1506 03/22/22 1820  VITAMINB12  --  1,328*  FOLATE 21.4  --   FERRITIN 229  --   TIBC 218*  --   IRON 29*  --   RETICCTPCT 3.2*  --    Sepsis Labs: Recent Labs  Lab 03/22/22 1820 03/22/22  2252 03/23/22 0727 03/24/22 0439  WBC 9.6 10.1 8.8 8.7    Microbiology No results found for this or any previous visit (from the past 240 hour(s)).  Procedures and diagnostic studies:  ECHOCARDIOGRAM COMPLETE  Result Date: 03/23/2022    ECHOCARDIOGRAM REPORT   Patient Name:   JAEDYN LARD Date of Exam: 03/23/2022 Medical Rec #:  [id number]      Height:       74.0 in Accession #:    [id number]     Weight:       246.5 lb Date of Birth:  [birth year]       BSA:          2.375 m Patient Age:    86 years       BP:           105/57 mmHg Patient Gender: M              HR:           83 bpm. Exam Location:  [facility] Procedure: 2D Echo, Cardiac Doppler, Color Doppler and Intracardiac            Opacification Agent Indications:     Elevated troponin  History:         Patient has prior history of Echocardiogram examinations, most                  recent 08/24/2021. CHF, CAD and Previous Myocardial Infarction,                  Coronary stent, Arrythmias:Atrial Fibrillation; Risk                  Factors:Former Smoker, Diabetes, Hypertension and Dyslipidemia.  Sonographer:     Rosalia Hammers Referring Phys:  [id number] DJMEQAST A ARIDA Diagnosing Phys: Nelva Bush MD  Sonographer Comments: Image acquisition challenging due to patient body habitus. IMPRESSIONS  1. Left ventricular ejection fraction, by estimation, is 50 to 55%. The left ventricle has low normal function. The left ventricle demonstrates regional wall motion abnormalities (see scoring diagram/findings for description). There is moderate left ventricular hypertrophy. Left ventricular  diastolic function could not be evaluated. There is mild hypokinesis of the left ventricular, basal inferior and inferolateral segments.  2. Right ventricular systolic function is normal. The right ventricular size is mildly enlarged. There is moderately elevated pulmonary artery systolic pressure. The estimated right ventricular systolic pressure is 41.9 mmHg.  3. Left atrial size was mildly dilated.  4. Right atrial size was mildly dilated.  5. The mitral valve is abnormal. Mild to moderate mitral valve regurgitation.  6. Tricuspid valve regurgitation is mild to moderate.  7. The aortic valve is tricuspid. There is mild thickening of the aortic valve. Aortic valve regurgitation is not visualized. Aortic valve sclerosis is present, with no evidence of aortic valve stenosis.  8. Aortic dilatation noted. There is borderline dilatation of the aortic root and of the ascending aorta, measuring 38 mm.  9. The inferior vena cava is dilated in size with <50% respiratory variability, suggesting right atrial pressure of 15 mmHg. FINDINGS  Left Ventricle: Left ventricular ejection fraction, by estimation, is 50 to 55%. The left ventricle has low normal function. The left ventricle demonstrates regional wall motion abnormalities. Mild hypokinesis of the left ventricular, basal inferior and  inferolateral segments. Definity contrast agent was given IV to delineate the left ventricular endocardial borders. The left ventricular internal cavity size was normal in size. There is moderate  left ventricular hypertrophy. Left ventricular diastolic function could not be evaluated due to atrial fibrillation. Left ventricular diastolic function could not be evaluated. Right Ventricle: The right ventricular size is mildly enlarged. No increase in right ventricular wall thickness. Right ventricular systolic function is normal. There is moderately elevated pulmonary artery systolic pressure. The tricuspid regurgitant velocity is 3.12 m/s,  and with an assumed right atrial pressure of 15 mmHg, the estimated right ventricular systolic pressure is 32.2 mmHg. Left Atrium: Left atrial size was mildly dilated. Right Atrium: Right atrial size was mildly dilated. Pericardium: There is no evidence of pericardial effusion. Mitral Valve: The mitral valve is abnormal. There is mild thickening of the mitral valve leaflet(s). Mild to moderate mitral valve regurgitation. Tricuspid Valve: The tricuspid valve is normal in structure. Tricuspid valve regurgitation is mild to moderate. Aortic Valve: The aortic valve is tricuspid. There is mild thickening of the aortic valve. There is mild aortic valve annular calcification. Aortic valve regurgitation is not visualized. Aortic valve sclerosis is present, with no evidence of aortic valve  stenosis. Aortic valve mean gradient measures 4.0 mmHg. Aortic valve peak gradient measures 8.3 mmHg. Aortic valve area, by VTI measures 2.12 cm. Pulmonic Valve: The pulmonic valve was normal in structure. Pulmonic valve regurgitation is mild. Aorta: Aortic dilatation noted. There is borderline dilatation of the aortic root and of the ascending aorta, measuring 38 mm. Pulmonary Artery: The pulmonary artery is of normal size. Venous: The inferior vena cava is dilated in size with less than 50% respiratory variability, suggesting right atrial pressure of 15 mmHg. IAS/Shunts: No atrial level shunt detected by color flow Doppler.  LEFT VENTRICLE PLAX 2D LVIDd:         4.76 cm LVIDs:         4.03 cm LV PW:         1.51 cm LV IVS:        1.64 cm LVOT diam:     1.90 cm LV SV:         58 LV SV Index:   25 LVOT Area:     2.84 cm  RIGHT VENTRICLE RV Basal diam:  5.18 cm TAPSE (M-mode): 2.5 cm LEFT ATRIUM              Index        RIGHT ATRIUM           Index LA diam:        4.30 cm  1.81 cm/m   RA Area:     23.50 cm LA Vol (A2C):   111.0 ml 46.73 ml/m  RA Volume:   73.10 ml  30.78 ml/m LA Vol (A4C):   70.2 ml  29.55 ml/m LA Biplane Vol: 88.3  ml  37.17 ml/m  AORTIC VALVE AV Area (Vmax):    1.95 cm AV Area (Vmean):   1.81 cm AV Area (VTI):     2.12 cm AV Vmax:           144.00 cm/s AV Vmean:          96.800 cm/s AV VTI:            0.275 m AV Peak Grad:      8.3 mmHg AV Mean Grad:      4.0 mmHg LVOT Vmax:         99.00 cm/s LVOT Vmean:        61.900 cm/s LVOT VTI:          0.206 m LVOT/AV  VTI ratio: 0.75  AORTA Ao Root diam: 3.80 cm MITRAL VALVE                TRICUSPID VALVE MV Area (PHT): 2.83 cm     TR Peak grad:   38.9 mmHg MV Decel Time: 268 msec     TR Vmax:        312.00 cm/s MV E velocity: 114.00 cm/s                             SHUNTS                             Systemic VTI:  0.21 m                             Systemic Diam: 1.90 cm Nelva Bush MD Electronically signed by Nelva Bush MD Signature Date/Time: 03/23/2022/4:26:17 PM    Final    US Abdomen Complete  Result Date: 03/22/2022 CLINICAL DATA:  Assess for cirrhosis and splenomegaly. EXAM: ABDOMEN ULTRASOUND COMPLETE COMPARISON:  Abdominopelvic CT 05/30/2019. FINDINGS: Gallbladder: Surgically absent. Common bile duct: Diameter: 7 mm, normal for cholecystectomy state. Liver: There may be subtle capsular nodularity of the left lobe. No focal lesion identified. Within normal limits in parenchymal echogenicity. Portal vein is patent on color Doppler imaging with normal direction of blood flow towards the liver. IVC: No abnormality visualized. Pancreas: The majority is obscured, visualized portion unremarkable. Spleen: Enlarged measuring 13.4 x 7.7 x 16.4 cm, volume is 885 cc. Splenic volume has decreased from prior abdominal CT. Right Kidney: Length: 12 cm. 2.4 cm cyst arises from the upper pole. This is benign and needs no further follow-up. Normal parenchymal echogenicity. No hydronephrosis. No renal calculi. Left Kidney: Length: 12.3 cm. Normal parenchymal echogenicity. No hydronephrosis. No visualized stone or focal lesion. Abdominal aorta: No aneurysm visualized. The mid and  distal aorta are obscured by overlying bowel gas. Other findings: No abdominal ascites. IMPRESSION: 1. Chronic splenomegaly, although splenic volume has diminished from 2020 CT. 2. Equivocal subtle capsular nodularity of the left lobe of the liver. There are no other findings of cirrhosis, no focal hepatic lesion. 3. Cholecystectomy without biliary dilatation. Electronically Signed   By: Keith Rake M.D.   On: 03/22/2022 17:43               LOS: 2 days   Juwaun Inskeep  [facility]   Pager on [email or web address]. If 7PM-7AM, please contact night-coverage at [email or web address]     03/24/2022, 3:51 PM

## 2022-03-24 NOTE — Progress Notes (Signed)
ANTICOAGULATION CONSULT NOTE  Pharmacy Consult for Heparin Drip Indication: chest pain/ACS  Patient Measurements: Weight: 110.9 kg (244 lb 7.8 oz) Heparin Dosing Weight: 99.9 kg  Labs: Recent Labs    03/22/22 1021 03/22/22 1227 03/22/22 1430 03/22/22 1506 03/22/22 1820 03/22/22 2252 03/23/22 0727 03/23/22 0829 03/24/22 0439  HGB 8.1*  --   --   --  7.7* 8.5* 8.3*  --  7.8*  HCT 24.6*  --   --   --  23.1* 24.3* 23.8*  --  23.1*  PLT 64*  --   --   --  69* 62* 63*  --  59*  APTT  --    < > 58*  --   --  75* 73*  --  77*  LABPROT  --   --  22.3*  --   --   --   --   --   --   INR  --   --  2.0*  --   --   --   --   --   --   HEPARINUNFRC  --   --   --  >1.10*  --   --  >1.10*  --  >1.10*  CREATININE 1.15  --   --   --   --   --   --  0.92 0.90  TROPONINIHS 69*   < > 66*  --  62* 72*  --   --   --    < > = values in this interval not displayed.    Estimated Creatinine Clearance: 86.8 mL/min (by C-G formula based on SCr of 0.9 mg/dL).  Medical History: Past Medical History:  Diagnosis Date   Arthritis    degenerative back    BPH with obstruction/lower urinary tract symptoms    CAD (coronary artery disease) 2010   stent   Diabetes mellitus    ED (erectile dysfunction)    Elevated PSA    GERD (gastroesophageal reflux disease)    Gross hematuria    H/O CT scan    recent renal CT due to hematuria, cystoscopy - normal     Hematuria    History of hiatal hernia    History of kidney stones    HLD (hyperlipidemia)    Hypertension    Ischemic heart disease 1991   with angioplasty   Kidney stone    Morbid obesity ([place])    Myocardial infarction ([place])    Peripheral neuropathy    Pernicious anemia    Pneumonia 1995   /w PE,    Pulmonary embolism ([place])    previous   Renal cyst    Sleep apnea    pt. denies sleep apnea, pt. states he has had 2 studies - last one 2011   Spinal stenosis    Assessment: Patient is a 81yo male presenting with intermittent chest pain.  Pharmacy consulted for heparin dosing for ACS/STEMI. Patient was taking apixaban prior to admission for atrial fibrillation. Heparin was held for EGD this morning and is now being restarted following completion of the procedure  Goal of Therapy:  Heparin level 0.3-0.7 units/ml aPTT 66 - 102 seconds Monitor platelets by anticoagulation protocol: Yes   Plan:  Continue heparin at 1200 units/hr Re-check aPTT & HL tomorrow AM Will check daily CBC per protocol Will check HL at least daily until correlation with aPTT then will switch to HL for monitoring  Dallie Piles, PharmD, BCPS Clinical Pharmacist   03/24/2022 3:50 PM

## 2022-03-24 NOTE — Progress Notes (Signed)
ANTICOAGULATION CONSULT NOTE  Pharmacy Consult for Heparin Drip Indication: chest pain/ACS  Patient Measurements: Weight: 111.8 kg (246 lb 7.6 oz) Heparin Dosing Weight: 99.9 kg  Labs: Recent Labs    03/22/22 1021 03/22/22 1227 03/22/22 1430 03/22/22 1506 03/22/22 1820 03/22/22 2252 03/23/22 0727 03/23/22 0829 03/24/22 0439  HGB 8.1*  --   --   --  7.7* 8.5* 8.3*  --   --   HCT 24.6*  --   --   --  23.1* 24.3* 23.8*  --   --   PLT 64*  --   --   --  69* 62* 63*  --   --   APTT  --    < > 58*  --   --  75* 73*  --  77*  LABPROT  --   --  22.3*  --   --   --   --   --   --   INR  --   --  2.0*  --   --   --   --   --   --   HEPARINUNFRC  --   --   --  >1.10*  --   --  >1.10*  --  >1.10*  CREATININE 1.15  --   --   --   --   --   --  0.92 0.90  TROPONINIHS 69*   < > 66*  --  62* 72*  --   --   --    < > = values in this interval not displayed.    Estimated Creatinine Clearance: 87 mL/min (by C-G formula based on SCr of 0.9 mg/dL).  Medical History: Past Medical History:  Diagnosis Date   Arthritis    degenerative back    BPH with obstruction/lower urinary tract symptoms    CAD (coronary artery disease) 2010   stent   Diabetes mellitus    ED (erectile dysfunction)    Elevated PSA    GERD (gastroesophageal reflux disease)    Gross hematuria    H/O CT scan    recent renal CT due to hematuria, cystoscopy - normal     Hematuria    History of hiatal hernia    History of kidney stones    HLD (hyperlipidemia)    Hypertension    Ischemic heart disease 1991   with angioplasty   Kidney stone    Morbid obesity ([place])    Myocardial infarction ([place])    Peripheral neuropathy    Pernicious anemia    Pneumonia 1995   /w PE,    Pulmonary embolism ([place])    previous   Renal cyst    Sleep apnea    pt. denies sleep apnea, pt. states he has had 2 studies - last one 2011   Spinal stenosis    Assessment: Patient is a 81yo male presenting with intermittent chest pain.  Pharmacy consulted for Heparin dosing for ACS/STEMI.  Patient was taking Apixaban prior to admission and did have a dose this morning.   Date Time HL/aPTT Rate/Comment  7/17 1506 >1.10  Baseline 7/17 2252 aPTT 75s Therapeutic x 1 7/18 0727 aPTT 73s Therapeutic x 2 7/19 0439  >1.10/77s Therapeutic   Goal of Therapy:  Heparin level 0.3-0.7 units/ml aPTT 66 - 102 seconds Monitor platelets by anticoagulation protocol: Yes   Plan:  Heparin therapeutic x 3 Continue heparin at 1200 units/hr Re-check aPTT & HL tomorrow AM Will check daily CBC per protocol Will check  HL at least daily until correlation with aPTT then will switch to HL for monitoring  Dorothe Pea, PharmD, BCPS Clinical Pharmacist   03/24/2022 5:14 AM

## 2022-03-25 ENCOUNTER — Encounter
Admission: EM | Disposition: A | Discharge status: Home Health | Payer: Self-pay | Source: Home / Self Care | Attending: Internal Medicine

## 2022-03-25 ENCOUNTER — Encounter: Payer: Self-pay | Admitting: Gastroenterology

## 2022-03-25 DIAGNOSIS — D649 Anemia, unspecified: Secondary | ICD-10-CM | POA: Diagnosis not present

## 2022-03-25 DIAGNOSIS — Z86711 Personal history of pulmonary embolism: Secondary | ICD-10-CM | POA: Diagnosis not present

## 2022-03-25 DIAGNOSIS — I25118 Atherosclerotic heart disease of native coronary artery with other forms of angina pectoris: Secondary | ICD-10-CM | POA: Diagnosis not present

## 2022-03-25 DIAGNOSIS — I482 Chronic atrial fibrillation, unspecified: Secondary | ICD-10-CM | POA: Diagnosis not present

## 2022-03-25 DIAGNOSIS — I5032 Chronic diastolic (congestive) heart failure: Secondary | ICD-10-CM | POA: Diagnosis not present

## 2022-03-25 DIAGNOSIS — I5033 Acute on chronic diastolic (congestive) heart failure: Secondary | ICD-10-CM | POA: Diagnosis not present

## 2022-03-25 DIAGNOSIS — K922 Gastrointestinal hemorrhage, unspecified: Secondary | ICD-10-CM

## 2022-03-25 DIAGNOSIS — I200 Unstable angina: Secondary | ICD-10-CM

## 2022-03-25 LAB — CBC
HCT: 22.2 % — ABNORMAL LOW (ref 39.0–52.0)
Hemoglobin: 7.5 g/dL — ABNORMAL LOW (ref 13.0–17.0)
MCH: 29.8 pg (ref 26.0–34.0)
MCHC: 33.8 g/dL (ref 30.0–36.0)
MCV: 88.1 fL (ref 80.0–100.0)
Platelets: 62 10*3/uL — ABNORMAL LOW (ref 150–400)
RBC: 2.52 MIL/uL — ABNORMAL LOW (ref 4.22–5.81)
RDW: 18.1 % — ABNORMAL HIGH (ref 11.5–15.5)
WBC: 7.5 10*3/uL (ref 4.0–10.5)
nRBC: 0 % (ref 0.0–0.2)

## 2022-03-25 LAB — URINALYSIS, COMPLETE (UACMP) WITH MICROSCOPIC
Bilirubin Urine: NEGATIVE
Glucose, UA: NEGATIVE mg/dL
Hgb urine dipstick: NEGATIVE
Ketones, ur: NEGATIVE mg/dL
Leukocytes,Ua: NEGATIVE
Nitrite: NEGATIVE
Protein, ur: NEGATIVE mg/dL
Specific Gravity, Urine: 1.008 (ref 1.005–1.030)
Squamous Epithelial / HPF: NONE SEEN (ref 0–5)
pH: 6 (ref 5.0–8.0)

## 2022-03-25 LAB — BASIC METABOLIC PANEL
Anion gap: 7 (ref 5–15)
BUN: 17 mg/dL (ref 8–23)
CO2: 22 mmol/L (ref 22–32)
Calcium: 8 mg/dL — ABNORMAL LOW (ref 8.9–10.3)
Chloride: 108 mmol/L (ref 98–111)
Creatinine, Ser: 0.99 mg/dL (ref 0.61–1.24)
GFR, Estimated: >60 mL/min (ref >60)
Glucose, Bld: 121 mg/dL — ABNORMAL HIGH (ref 70–99)
Potassium: 3.2 mmol/L — ABNORMAL LOW (ref 3.5–5.1)
Sodium: 137 mmol/L (ref 135–145)

## 2022-03-25 LAB — GLUCOSE, CAPILLARY
Glucose-Capillary: 131 mg/dL — ABNORMAL HIGH (ref 70–99)
Glucose-Capillary: 205 mg/dL — ABNORMAL HIGH (ref 70–99)
Glucose-Capillary: 243 mg/dL — ABNORMAL HIGH (ref 70–99)
Glucose-Capillary: 205 mg/dL — ABNORMAL HIGH (ref 70–99)

## 2022-03-25 LAB — HEMOGLOBIN AND HEMATOCRIT, BLOOD
HCT: 25.8 % — ABNORMAL LOW (ref 39.0–52.0)
Hemoglobin: 8.5 g/dL — ABNORMAL LOW (ref 13.0–17.0)

## 2022-03-25 LAB — MAGNESIUM: Magnesium: 1.7 mg/dL (ref 1.7–2.4)

## 2022-03-25 LAB — APTT: aPTT: 77 seconds — ABNORMAL HIGH (ref 24–36)

## 2022-03-25 LAB — HEPARIN LEVEL (UNFRACTIONATED): Heparin Unfractionated: 0.61 IU/mL (ref 0.30–0.70)

## 2022-03-25 SURGERY — COLONOSCOPY WITH PROPOFOL
Anesthesia: General

## 2022-03-25 MED ORDER — POLYETHYLENE GLYCOL 3350 17 G PO PACK
17.0000 g | PACK | Freq: Every day | ORAL | Status: DC
  Administered 2022-03-29 – 2022-03-31 (×3): 17 g via ORAL
  Filled 2022-03-25 (×5): qty 1

## 2022-03-25 MED ORDER — MELATONIN 5 MG PO TABS
5.0000 mg | ORAL_TABLET | Freq: Every evening | ORAL | Status: DC | PRN
Start: 2022-03-25 — End: 2022-03-31
  Administered 2022-03-26 – 2022-03-30 (×5): 5 mg via ORAL
  Filled 2022-03-25 (×6): qty 1

## 2022-03-25 MED ORDER — POTASSIUM CHLORIDE CRYS ER 20 MEQ PO TBCR
40.0000 meq | EXTENDED_RELEASE_TABLET | Freq: Once | ORAL | Status: AC
  Administered 2022-03-25: 40 meq via ORAL
  Filled 2022-03-25: qty 2

## 2022-03-25 MED ORDER — BISACODYL 10 MG RE SUPP: 10.0000 mg | Freq: Every day | RECTAL | Status: DC | PRN

## 2022-03-25 NOTE — Care Management Important Message (Signed)
Important Message  Patient Details  Name: Cody Price MRN: [id number] Date of Birth: [birth year]   Medicare Important Message Given:  Yes     Dannette Barbara 03/25/2022, 1:54 PM

## 2022-03-25 NOTE — Progress Notes (Addendum)
Progress Note  Patient Name: Cody Price Date of Encounter: 03/25/2022  [facility] Cardiologist: Ida Rogue, MD   Subjective   EGD showed no active bleeding, BP was low so no plan for colonoscopy. Hgb today 7.5, planning on 1 unit PRBC transfusion. Patient overall is feeling poorly. He denies chest pain.  Inpatient Medications    Scheduled Meds:  sodium chloride   Intravenous Once   aspirin EC  81 mg Oral Daily   atorvastatin  40 mg Oral Daily   [START ON 03/26/2022] cyanocobalamin  1,000 mcg Intramuscular Q30 days   finasteride  5 mg Oral Daily   furosemide  40 mg Intravenous BID   insulin aspart  0-5 Units Subcutaneous QHS   insulin aspart  0-9 Units Subcutaneous TID WC   metoprolol succinate  50 mg Oral Daily   multivitamin with minerals  1 tablet Oral Daily   omega-3 acid ethyl esters  2 g Oral Daily   pantoprazole (PROTONIX) IV  40 mg Intravenous Q12H   potassium chloride  40 mEq Oral Once   tamsulosin  0.4 mg Oral Daily   Continuous Infusions:  heparin 1,200 Units/hr (03/24/22 2018)   PRN Meds: oxyCODONE **AND** acetaminophen, acetaminophen, dextromethorphan-guaiFENesin, diazepam, hydrALAZINE, ipratropium-albuterol, methocarbamol, morphine injection, ondansetron (ZOFRAN) IV   Vital Signs    Vitals:   03/24/22 2039 03/25/22 0342 03/25/22 0345 03/25/22 0805  BP: (!) 107/49  (!) 111/57 121/60  Pulse: 86  86 93  Resp: '20  17 19  '$ Temp: 98 F (36.7 C)  98.2 F (36.8 C) 98 F (36.7 C)  TempSrc: Oral  Oral   SpO2: 100%  94% 97%  Weight:  111 kg      Intake/Output Summary (Last 24 hours) at 03/25/2022 0900 Last data filed at 03/25/2022 0803 Gross per 24 hour  Intake 718.29 ml  Output 1425 ml  Net -706.71 ml      03/25/2022    3:42 AM 03/24/2022    5:38 AM 03/23/2022    4:37 AM  Last 3 Weights  Weight (lbs) 244 lb 11.4 oz 244 lb 7.8 oz 246 lb 7.6 oz  Weight (kg) 111 kg 110.9 kg 111.8 kg      Telemetry    Afib, HR 80s, frequent PVCs -  Personally Reviewed  ECG    No new - Personally Reviewed  Physical Exam   GEN: No acute distress.   Neck: No JVD Cardiac: Irreg Irreg, no murmurs, rubs, or gallops.  Respiratory: Clear to auscultation bilaterally. GI: Soft, nontender, non-distended  MS: No edema; No deformity. Neuro:  Nonfocal  Psych: Normal affect   Labs    High Sensitivity Troponin:   Recent Labs  Lab 03/22/22 1021 03/22/22 1227 03/22/22 1430 03/22/22 1820 03/22/22 2252  TROPONINIHS 69* 72* 66* 62* 72*     Chemistry Recent Labs  Lab 03/23/22 0829 03/24/22 0439 03/25/22 0431  NA 136 137 137  K 3.8 3.8 3.2*  CL 106 108 108  CO2 '24 25 22  '$ GLUCOSE 133* 117* 121*  BUN '18 17 17  '$ CREATININE 0.92 0.90 0.99  CALCIUM 8.1* 8.0* 8.0*  MG  --   --  1.7  GFRNONAA >60 >60 >60  ANIONGAP 6 4* 7    Lipids  Recent Labs  Lab 03/22/22 1025  CHOL 62  TRIG 75  HDL 20*  LDLCALC 27  CHOLHDL 3.1    Hematology Recent Labs  Lab 03/23/22 0727 03/24/22 0439 03/25/22 0431  WBC 8.8 8.7 7.5  RBC 2.74* 2.63* 2.52*  HGB 8.3* 7.8* 7.5*  HCT 23.8* 23.1* 22.2*  MCV 86.9 87.8 88.1  MCH 30.3 29.7 29.8  MCHC 34.9 33.8 33.8  RDW 17.9* 18.0* 18.1*  PLT 63* 59* 62*   Thyroid No results for input(s): "TSH", "FREET4" in the last 168 hours.  BNP Recent Labs  Lab 03/22/22 1015  BNP 431.0*    DDimer No results for input(s): "DDIMER" in the last 168 hours.   Radiology    ECHOCARDIOGRAM COMPLETE  Result Date: 03/23/2022    ECHOCARDIOGRAM REPORT   Patient Name:   SABIEN UMLAND Date of Exam: 03/23/2022 Medical Rec #:  [id number]      Height:       74.0 in Accession #:    [id number]     Weight:       246.5 lb Date of Birth:  Apr 14, 1941       BSA:          2.375 m Patient Age:    14 years       BP:           105/57 mmHg Patient Gender: M              HR:           83 bpm. Exam Location:  [facility] Procedure: 2D Echo, Cardiac Doppler, Color Doppler and Intracardiac            Opacification Agent Indications:     Elevated  troponin  History:         Patient has prior history of Echocardiogram examinations, most                  recent 08/24/2021. CHF, CAD and Previous Myocardial Infarction,                  Coronary stent, Arrythmias:Atrial Fibrillation; Risk                  Factors:Former Smoker, Diabetes, Hypertension and Dyslipidemia.  Sonographer:     Rosalia Hammers Referring Phys:  [id number] A ARIDA Diagnosing Phys: Nelva Bush MD  Sonographer Comments: Image acquisition challenging due to patient body habitus. IMPRESSIONS  1. Left ventricular ejection fraction, by estimation, is 50 to 55%. The left ventricle has low normal function. The left ventricle demonstrates regional wall motion abnormalities (see scoring diagram/findings for description). There is moderate left ventricular hypertrophy. Left ventricular diastolic function could not be evaluated. There is mild hypokinesis of the left ventricular, basal inferior and inferolateral segments.  2. Right ventricular systolic function is normal. The right ventricular size is mildly enlarged. There is moderately elevated pulmonary artery systolic pressure. The estimated right ventricular systolic pressure is 14.4 mmHg.  3. Left atrial size was mildly dilated.  4. Right atrial size was mildly dilated.  5. The mitral valve is abnormal. Mild to moderate mitral valve regurgitation.  6. Tricuspid valve regurgitation is mild to moderate.  7. The aortic valve is tricuspid. There is mild thickening of the aortic valve. Aortic valve regurgitation is not visualized. Aortic valve sclerosis is present, with no evidence of aortic valve stenosis.  8. Aortic dilatation noted. There is borderline dilatation of the aortic root and of the ascending aorta, measuring 38 mm.  9. The inferior vena cava is dilated in size with <50% respiratory variability, suggesting right atrial pressure of 15 mmHg. FINDINGS  Left Ventricle: Left ventricular ejection fraction, by estimation, is 50 to 55%. The  left ventricle  has low normal function. The left ventricle demonstrates regional wall motion abnormalities. Mild hypokinesis of the left ventricular, basal inferior and  inferolateral segments. Definity contrast agent was given IV to delineate the left ventricular endocardial borders. The left ventricular internal cavity size was normal in size. There is moderate left ventricular hypertrophy. Left ventricular diastolic function could not be evaluated due to atrial fibrillation. Left ventricular diastolic function could not be evaluated. Right Ventricle: The right ventricular size is mildly enlarged. No increase in right ventricular wall thickness. Right ventricular systolic function is normal. There is moderately elevated pulmonary artery systolic pressure. The tricuspid regurgitant velocity is 3.12 m/s, and with an assumed right atrial pressure of 15 mmHg, the estimated right ventricular systolic pressure is 74.2 mmHg. Left Atrium: Left atrial size was mildly dilated. Right Atrium: Right atrial size was mildly dilated. Pericardium: There is no evidence of pericardial effusion. Mitral Valve: The mitral valve is abnormal. There is mild thickening of the mitral valve leaflet(s). Mild to moderate mitral valve regurgitation. Tricuspid Valve: The tricuspid valve is normal in structure. Tricuspid valve regurgitation is mild to moderate. Aortic Valve: The aortic valve is tricuspid. There is mild thickening of the aortic valve. There is mild aortic valve annular calcification. Aortic valve regurgitation is not visualized. Aortic valve sclerosis is present, with no evidence of aortic valve  stenosis. Aortic valve mean gradient measures 4.0 mmHg. Aortic valve peak gradient measures 8.3 mmHg. Aortic valve area, by VTI measures 2.12 cm. Pulmonic Valve: The pulmonic valve was normal in structure. Pulmonic valve regurgitation is mild. Aorta: Aortic dilatation noted. There is borderline dilatation of the aortic root and of the  ascending aorta, measuring 38 mm. Pulmonary Artery: The pulmonary artery is of normal size. Venous: The inferior vena cava is dilated in size with less than 50% respiratory variability, suggesting right atrial pressure of 15 mmHg. IAS/Shunts: No atrial level shunt detected by color flow Doppler.  LEFT VENTRICLE PLAX 2D LVIDd:         4.76 cm LVIDs:         4.03 cm LV PW:         1.51 cm LV IVS:        1.64 cm LVOT diam:     1.90 cm LV SV:         58 LV SV Index:   25 LVOT Area:     2.84 cm  RIGHT VENTRICLE RV Basal diam:  5.18 cm TAPSE (M-mode): 2.5 cm LEFT ATRIUM              Index        RIGHT ATRIUM           Index LA diam:        4.30 cm  1.81 cm/m   RA Area:     23.50 cm LA Vol (A2C):   111.0 ml 46.73 ml/m  RA Volume:   73.10 ml  30.78 ml/m LA Vol (A4C):   70.2 ml  29.55 ml/m LA Biplane Vol: 88.3 ml  37.17 ml/m  AORTIC VALVE AV Area (Vmax):    1.95 cm AV Area (Vmean):   1.81 cm AV Area (VTI):     2.12 cm AV Vmax:           144.00 cm/s AV Vmean:          96.800 cm/s AV VTI:            0.275 m AV Peak Grad:  8.3 mmHg AV Mean Grad:      4.0 mmHg LVOT Vmax:         99.00 cm/s LVOT Vmean:        61.900 cm/s LVOT VTI:          0.206 m LVOT/AV VTI ratio: 0.75  AORTA Ao Root diam: 3.80 cm MITRAL VALVE                TRICUSPID VALVE MV Area (PHT): 2.83 cm     TR Peak grad:   38.9 mmHg MV Decel Time: 268 msec     TR Vmax:        312.00 cm/s MV E velocity: 114.00 cm/s                             SHUNTS                             Systemic VTI:  0.21 m                             Systemic Diam: 1.90 cm Nelva Bush MD Electronically signed by Nelva Bush MD Signature Date/Time: 03/23/2022/4:26:17 PM    Final     Cardiac Studies   Echocardiogram completed 03/23/2022 1. Left ventricular ejection fraction, by estimation, is 50 to 55%. The  left ventricle has low normal function. The left ventricle demonstrates  regional wall motion abnormalities (see scoring diagram/findings for  description).  There is moderate left  ventricular hypertrophy. Left ventricular diastolic function could not be  evaluated. There is mild hypokinesis of the left ventricular, basal  inferior and inferolateral segments.   2. Right ventricular systolic function is normal. The right ventricular  size is mildly enlarged. There is moderately elevated pulmonary artery  systolic pressure. The estimated right ventricular systolic pressure is  38.1 mmHg.   3. Left atrial size was mildly dilated.   4. Right atrial size was mildly dilated.   5. The mitral valve is abnormal. Mild to moderate mitral valve  regurgitation.   6. Tricuspid valve regurgitation is mild to moderate.   7. The aortic valve is tricuspid. There is mild thickening of the aortic  valve. Aortic valve regurgitation is not visualized. Aortic valve  sclerosis is present, with no evidence of aortic valve stenosis.   8. Aortic dilatation noted. There is borderline dilatation of the aortic  root and of the ascending aorta, measuring 38 mm.   9. The inferior vena cava is dilated in size with <50% respiratory  variability, suggesting right atrial pressure of 15 mmHg.   LHC 08/2021    Dist LAD lesion is 80% stenosed.   Prox LAD-1 lesion is 10% stenosed.   Prox Cx lesion is 50% stenosed.   Dist RCA lesion is 100% stenosed.   Ost 1st Diag lesion is 90% stenosed.   Prox RCA lesion is 99% stenosed.   Ost 3rd Mrg to 3rd Mrg lesion is 50% stenosed.   Mid LAD lesion is 10% stenosed.   Ost LM lesion is 20% stenosed.   Non-stenotic Prox LAD-2 lesion was previously treated.   The left ventricular systolic function is normal.   LV end diastolic pressure is moderately elevated.   The left ventricular ejection fraction is 50-55% by visual estimate.   1.  Patent LAD stents with no significant restenosis.  Chronically occluded right coronary artery with left-to-right collaterals and moderate left circumflex disease.  No significant change in coronary anatomy  since most recent cardiac catheterization. 2.  Low normal LV systolic function with an EF of 50 to 55%.  Moderately elevated left ventricular end-diastolic pressure at 22 mmHg.   Recommendations: Elevated troponin is likely due to supply demand ischemia. Recommend continuing medical therapy. Eliquis can be resumed tonight. The patient is volume overloaded and I elected to resume furosemide 40 mg by mouth twice daily.  Patient Profile     81 y.o. male with a past medical history of coronary artery disease s/p multiple PCI's, chronic diastolic heart failure, permanent atrial fibrillation, DM II, tobacco use, essential hypertension, and hyperlipidemia who is being seen and evaluated for chest pain with possible unstable angina.  Assessment & Plan    NSTEMI - patient denies chest pain - HS trop peak 72 - Ok to restart IV heparin per GI. Hgb is down to 7.5 today. Plan for 1 unit transfusion this AM. Continue to trend - continue ASA, statin and BB therapy - LHC in 08/2021 showed patent LAD stents with no significant disease with chronically occluded RCA with L>$ collaterals and moderate left Cx disease - Prior discussion of LHC, however may not be the best candidate given weakness and frailty.   Acute on chronic diastolic heart failure - IV lasix '40mg'$  BID - Echo showed LVEF 50-55% with subtle basal inferior/inferolateral HK, moderate pulmonary HTN - Net -3L - kidney function stable - replete K  Permanent Afib - Eliquis on hold for anemia >> heparin drip  Anemia - EGD did  not show active bleeding. Due to hypotension during procedure no plan for colonoscopy.  - Hgb 7.5 - s/p I unit 7/19, plan for another unit PRBC this AM  HTN - BP intermittently soft at times - continue Toprol '50mg'$  daily  For questions or updates, please contact [facility] Please consult [email or web address] for contact info under        Signed, Demetrica Zipp Ninfa Meeker, PA-C  03/25/2022, 9:00 AM

## 2022-03-25 NOTE — Progress Notes (Signed)
ANTICOAGULATION CONSULT NOTE  Pharmacy Consult for Heparin Drip Indication: chest pain/ACS  Patient Measurements: Weight: 111 kg (244 lb 11.4 oz) Heparin Dosing Weight: 99.9 kg  Labs: Recent Labs    03/22/22 1021 03/22/22 1227 03/22/22 1430 03/22/22 1506 03/22/22 1820 03/22/22 2252 03/23/22 0727 03/23/22 0829 03/24/22 0439  HGB 8.1*  --   --   --  7.7* 8.5* 8.3*  --  7.8*  HCT 24.6*  --   --   --  23.1* 24.3* 23.8*  --  23.1*  PLT 64*  --   --   --  69* 62* 63*  --  59*  APTT  --    < > 58*  --   --  75* 73*  --  77*  LABPROT  --   --  22.3*  --   --   --   --   --   --   INR  --   --  2.0*  --   --   --   --   --   --   HEPARINUNFRC  --   --   --  >1.10*  --   --  >1.10*  --  >1.10*  CREATININE 1.15  --   --   --   --   --   --  0.92 0.90  TROPONINIHS 69*   < > 66*  --  62* 72*  --   --   --    < > = values in this interval not displayed.    Estimated Creatinine Clearance: 86.8 mL/min (by C-G formula based on SCr of 0.9 mg/dL).  Medical History: Past Medical History:  Diagnosis Date   Arthritis    degenerative back    BPH with obstruction/lower urinary tract symptoms    CAD (coronary artery disease) 2010   stent   Diabetes mellitus    ED (erectile dysfunction)    Elevated PSA    GERD (gastroesophageal reflux disease)    Gross hematuria    H/O CT scan    recent renal CT due to hematuria, cystoscopy - normal     Hematuria    History of hiatal hernia    History of kidney stones    HLD (hyperlipidemia)    Hypertension    Ischemic heart disease 1991   with angioplasty   Kidney stone    Morbid obesity ([place])    Myocardial infarction ([place])    Peripheral neuropathy    Pernicious anemia    Pneumonia 1995   /w PE,    Pulmonary embolism ([place])    previous   Renal cyst    Sleep apnea    pt. denies sleep apnea, pt. states he has had 2 studies - last one 2011   Spinal stenosis    Assessment: Patient is a 81yo male presenting with intermittent chest pain.  Pharmacy consulted for heparin dosing for ACS/STEMI. Patient was taking apixaban prior to admission for atrial fibrillation. Heparin was held for EGD this morning and is now being restarted following completion of the procedure  Goal of Therapy:  Heparin level 0.3-0.7 units/ml aPTT 66 - 102 seconds Monitor platelets by anticoagulation protocol: Yes   Plan:  Heparin remains therapeutic  Continue heparin at 1200 units/hr Re-check HL with AM labs  Will check daily CBC per protocol  Dorothe Pea, PharmD, BCPS Clinical Pharmacist   03/25/2022 6:06 AM

## 2022-03-25 NOTE — Progress Notes (Addendum)
Progress Note    Cody Price  EPP:[id number] DOB: Nov 03, 1940  DOA: 03/22/2022 PCP: Baxter Hire, MD      Brief Narrative:    Medical records reviewed and are as summarized below:  Cody Price is a 81 y.o. male with medical history significant of CAD with stent, hypertension, hyperlipidemia, diabetes mellitus, atrial fibrillation and PE on Eliquis, BPH, kidney stone, thrombocytopenia, peptic ulcer disease, chronic diastolic CHF, who presented to the hospital with intermittent chest pain, exertional shortness of breath.  He also reported dark stools which she attributed to some juice.      Assessment/Plan:   Principal Problem:   Chest pain Active Problems:   CAD (coronary artery disease)   Chronic diastolic CHF (congestive heart failure) (HCC)   Atrial fibrillation, chronic (HCC)   Hx of pulmonary embolus   Diabetes mellitus without complication (HCC)   Thrombocytopenia (HCC)   BPH with obstruction/lower urinary tract symptoms   Normocytic anemia   Hypertension   HLD (hyperlipidemia)   Anxiety   Acute on chronic heart failure with preserved ejection fraction (HFpEF) (HCC)    Body mass index is 31.42 kg/m.  (Obesity)  Iron deficiency anemia, symptomatic anemia: Hemoglobin is still trending down.  He is getting another unit of packed red blood cells today.  Monitor H&H.  S/p transfusion with 1 units of PRBCs 03/24/2022.  S/p transfusion with 1 unit of PRBCs on 03/22/2022.  No overt GI bleeding.  S/p EGD on 03/24/2022 which showed single gastric polyp.  No plan for colonoscopy per Dr. Virgina Jock, gastroenterologist.  Patient's blood pressure dropped during EGD requiring Neo-Synephrine..  Elevated troponin, CAD with PCI to LAD (CTO dRCA, 80% distal LAD, 50% prox Lcx): No NSTEMI per cardiologist.  No plans for cardiac cath at this time.  Chronic atrial fibrillation: Continue Toprol-XL.  IV heparin has been discontinued by cardiologist.    Hypokalemia: Replete potassium  and monitor levels  Acute on chronic diastolic CHF: Improved.  Continue IV Lasix.  2D echo in 03/23/2022 showed EF estimated at 50 to 55%, moderate LVH, moderate to elevated pulmonary artery systolic pressure, mild to moderate MR, mild to moderate TR.  History of pulmonary embolism: Eliquis is on hold.   Diet Order             Diet Heart Room service appropriate? Yes; Fluid consistency: Thin  Diet effective now                            Consultants: Cardiologist Gastroenterologist  Procedures: EGD on 03/24/2022    Medications:    sodium chloride   Intravenous Once   aspirin EC  81 mg Oral Daily   atorvastatin  40 mg Oral Daily   [START ON 03/26/2022] cyanocobalamin  1,000 mcg Intramuscular Q30 days   finasteride  5 mg Oral Daily   furosemide  40 mg Intravenous BID   insulin aspart  0-5 Units Subcutaneous QHS   insulin aspart  0-9 Units Subcutaneous TID WC   metoprolol succinate  50 mg Oral Daily   multivitamin with minerals  1 tablet Oral Daily   omega-3 acid ethyl esters  2 g Oral Daily   pantoprazole (PROTONIX) IV  40 mg Intravenous Q12H   polyethylene glycol  17 g Oral Daily   tamsulosin  0.4 mg Oral Daily   Continuous Infusions:     Anti-infectives (From admission, onward)    None  Family Communication/Anticipated D/C date and plan/Code Status   DVT prophylaxis:      Code Status: Full Code  Family Communication: None Disposition Plan: Plan to discharge home in 1 to 2 days   Status is: Inpatient Remains inpatient appropriate because: Symptomatic anemia       Subjective:   He complains of generalized weakness and fatigue  Objective:    Vitals:   03/25/22 0938 03/25/22 1037 03/25/22 1146 03/25/22 1225  BP: (!) 115/56 (!) 103/59 (!) 96/39 (!) 102/57  Pulse: (!) 101 68 80   Resp: 18 20 (!) 21   Temp: 97.8 F (36.6 C) 97.6 F (36.4 C) (!) 97.5 F (36.4 C)   TempSrc:      SpO2:   97%   Weight:        No data found.   Intake/Output Summary (Last 24 hours) at 03/25/2022 1404 Last data filed at 03/25/2022 1225 Gross per 24 hour  Intake 1202.29 ml  Output 1425 ml  Net -222.71 ml   Filed Weights   03/23/22 0437 03/24/22 0538 03/25/22 0342  Weight: 111.8 kg 110.9 kg 111 kg    Exam:  GEN: NAD SKIN: Warm and dry EYES: EOMI ENT: MMM CV: RRR PULM: CTA B ABD: soft, obese, NT, +BS CNS: AAO x 3, non focal EXT: No edema or tenderness        Data Reviewed:   I have personally reviewed following labs and imaging studies:  Labs: Labs show the following:   Basic Metabolic Panel: Recent Labs  Lab 03/22/22 1021 03/23/22 0829 03/24/22 0439 03/25/22 0431  NA 135 136 137 137  K 4.5 3.8 3.8 3.2*  CL 106 106 108 108  CO2 21* '24 25 22  '$ GLUCOSE 249* 133* 117* 121*  BUN '21 18 17 17  '$ CREATININE 1.15 0.92 0.90 0.99  CALCIUM 8.5* 8.1* 8.0* 8.0*  MG  --   --   --  1.7   GFR Estimated Creatinine Clearance: 78.9 mL/min (by C-G formula based on SCr of 0.99 mg/dL). Liver Function Tests: No results for input(s): "AST", "ALT", "ALKPHOS", "BILITOT", "PROT", "ALBUMIN" in the last 168 hours. No results for input(s): "LIPASE", "AMYLASE" in the last 168 hours. No results for input(s): "AMMONIA" in the last 168 hours. Coagulation profile Recent Labs  Lab 03/22/22 1430  INR 2.0*    CBC: Recent Labs  Lab 03/22/22 1820 03/22/22 2252 03/23/22 0727 03/24/22 0439 03/25/22 0431  WBC 9.6 10.1 8.8 8.7 7.5  HGB 7.7* 8.5* 8.3* 7.8* 7.5*  HCT 23.1* 24.3* 23.8* 23.1* 22.2*  MCV 89.9 86.5 86.9 87.8 88.1  PLT 69* 62* 63* 59* 62*   Cardiac Enzymes: No results for input(s): "CKTOTAL", "CKMB", "CKMBINDEX", "TROPONINI" in the last 168 hours. BNP (last 3 results) No results for input(s): "PROBNP" in the last 8760 hours. CBG: Recent Labs  Lab 03/24/22 1203 03/24/22 1538 03/24/22 2013 03/25/22 0814 03/25/22 1146  GLUCAP 129* 148* 215* 131* 205*   D-Dimer: No results for  input(s): "DDIMER" in the last 72 hours. Hgb A1c: Recent Labs    03/22/22 1430  HGBA1C 7.6*   Lipid Profile: No results for input(s): "CHOL", "HDL", "LDLCALC", "TRIG", "CHOLHDL", "LDLDIRECT" in the last 72 hours.  Thyroid function studies: No results for input(s): "TSH", "T4TOTAL", "T3FREE", "THYROIDAB" in the last 72 hours.  Invalid input(s): "FREET3" Anemia work up: Recent Labs    03/22/22 1506 03/22/22 1820  VITAMINB12  --  1,328*  FOLATE 21.4  --   FERRITIN 229  --  TIBC 218*  --   IRON 29*  --   RETICCTPCT 3.2*  --    Sepsis Labs: Recent Labs  Lab 03/22/22 2252 03/23/22 0727 03/24/22 0439 03/25/22 0431  WBC 10.1 8.8 8.7 7.5    Microbiology No results found for this or any previous visit (from the past 240 hour(s)).  Procedures and diagnostic studies:  ECHOCARDIOGRAM COMPLETE  Result Date: 03/23/2022    ECHOCARDIOGRAM REPORT   Patient Name:   DURAN OHERN Date of Exam: 03/23/2022 Medical Rec #:  [id number]      Height:       74.0 in Accession #:    [id number]     Weight:       246.5 lb Date of Birth:  [birth year]       BSA:          2.375 m Patient Age:    69 years       BP:           105/57 mmHg Patient Gender: M              HR:           83 bpm. Exam Location:  [facility] Procedure: 2D Echo, Cardiac Doppler, Color Doppler and Intracardiac            Opacification Agent Indications:     Elevated troponin  History:         Patient has prior history of Echocardiogram examinations, most                  recent 08/24/2021. CHF, CAD and Previous Myocardial Infarction,                  Coronary stent, Arrythmias:Atrial Fibrillation; Risk                  Factors:Former Smoker, Diabetes, Hypertension and Dyslipidemia.  Sonographer:     Rosalia Hammers Referring Phys:  [id number] NATFTDDU A ARIDA Diagnosing Phys: Nelva Bush MD  Sonographer Comments: Image acquisition challenging due to patient body habitus. IMPRESSIONS  1. Left ventricular ejection fraction, by estimation, is 50 to  55%. The left ventricle has low normal function. The left ventricle demonstrates regional wall motion abnormalities (see scoring diagram/findings for description). There is moderate left ventricular hypertrophy. Left ventricular diastolic function could not be evaluated. There is mild hypokinesis of the left ventricular, basal inferior and inferolateral segments.  2. Right ventricular systolic function is normal. The right ventricular size is mildly enlarged. There is moderately elevated pulmonary artery systolic pressure. The estimated right ventricular systolic pressure is 20.2 mmHg.  3. Left atrial size was mildly dilated.  4. Right atrial size was mildly dilated.  5. The mitral valve is abnormal. Mild to moderate mitral valve regurgitation.  6. Tricuspid valve regurgitation is mild to moderate.  7. The aortic valve is tricuspid. There is mild thickening of the aortic valve. Aortic valve regurgitation is not visualized. Aortic valve sclerosis is present, with no evidence of aortic valve stenosis.  8. Aortic dilatation noted. There is borderline dilatation of the aortic root and of the ascending aorta, measuring 38 mm.  9. The inferior vena cava is dilated in size with <50% respiratory variability, suggesting right atrial pressure of 15 mmHg. FINDINGS  Left Ventricle: Left ventricular ejection fraction, by estimation, is 50 to 55%. The left ventricle has low normal function. The left ventricle demonstrates regional wall motion abnormalities. Mild hypokinesis of the left ventricular, basal inferior and  inferolateral segments. Definity contrast agent was given IV to delineate the left ventricular endocardial borders. The left ventricular internal cavity size was normal in size. There is moderate left ventricular hypertrophy. Left ventricular diastolic function could not be evaluated due to atrial fibrillation. Left ventricular diastolic function could not be evaluated. Right Ventricle: The right ventricular size is  mildly enlarged. No increase in right ventricular wall thickness. Right ventricular systolic function is normal. There is moderately elevated pulmonary artery systolic pressure. The tricuspid regurgitant velocity is 3.12 m/s, and with an assumed right atrial pressure of 15 mmHg, the estimated right ventricular systolic pressure is 88.5 mmHg. Left Atrium: Left atrial size was mildly dilated. Right Atrium: Right atrial size was mildly dilated. Pericardium: There is no evidence of pericardial effusion. Mitral Valve: The mitral valve is abnormal. There is mild thickening of the mitral valve leaflet(s). Mild to moderate mitral valve regurgitation. Tricuspid Valve: The tricuspid valve is normal in structure. Tricuspid valve regurgitation is mild to moderate. Aortic Valve: The aortic valve is tricuspid. There is mild thickening of the aortic valve. There is mild aortic valve annular calcification. Aortic valve regurgitation is not visualized. Aortic valve sclerosis is present, with no evidence of aortic valve  stenosis. Aortic valve mean gradient measures 4.0 mmHg. Aortic valve peak gradient measures 8.3 mmHg. Aortic valve area, by VTI measures 2.12 cm. Pulmonic Valve: The pulmonic valve was normal in structure. Pulmonic valve regurgitation is mild. Aorta: Aortic dilatation noted. There is borderline dilatation of the aortic root and of the ascending aorta, measuring 38 mm. Pulmonary Artery: The pulmonary artery is of normal size. Venous: The inferior vena cava is dilated in size with less than 50% respiratory variability, suggesting right atrial pressure of 15 mmHg. IAS/Shunts: No atrial level shunt detected by color flow Doppler.  LEFT VENTRICLE PLAX 2D LVIDd:         4.76 cm LVIDs:         4.03 cm LV PW:         1.51 cm LV IVS:        1.64 cm LVOT diam:     1.90 cm LV SV:         58 LV SV Index:   25 LVOT Area:     2.84 cm  RIGHT VENTRICLE RV Basal diam:  5.18 cm TAPSE (M-mode): 2.5 cm LEFT ATRIUM              Index         RIGHT ATRIUM           Index LA diam:        4.30 cm  1.81 cm/m   RA Area:     23.50 cm LA Vol (A2C):   111.0 ml 46.73 ml/m  RA Volume:   73.10 ml  30.78 ml/m LA Vol (A4C):   70.2 ml  29.55 ml/m LA Biplane Vol: 88.3 ml  37.17 ml/m  AORTIC VALVE AV Area (Vmax):    1.95 cm AV Area (Vmean):   1.81 cm AV Area (VTI):     2.12 cm AV Vmax:           144.00 cm/s AV Vmean:          96.800 cm/s AV VTI:            0.275 m AV Peak Grad:      8.3 mmHg AV Mean Grad:      4.0 mmHg LVOT Vmax:  99.00 cm/s LVOT Vmean:        61.900 cm/s LVOT VTI:          0.206 m LVOT/AV VTI ratio: 0.75  AORTA Ao Root diam: 3.80 cm MITRAL VALVE                TRICUSPID VALVE MV Area (PHT): 2.83 cm     TR Peak grad:   38.9 mmHg MV Decel Time: 268 msec     TR Vmax:        312.00 cm/s MV E velocity: 114.00 cm/s                             SHUNTS                             Systemic VTI:  0.21 m                             Systemic Diam: 1.90 cm Nelva Bush MD Electronically signed by Nelva Bush MD Signature Date/Time: 03/23/2022/4:26:17 PM    Final                LOS: 3 days   Jasmen Emrich  [facility]   Pager on [email or web address]. If 7PM-7AM, please contact night-coverage at [email or web address]     03/25/2022, 2:04 PM

## 2022-03-26 ENCOUNTER — Other Ambulatory Visit: Payer: Self-pay

## 2022-03-26 ENCOUNTER — Encounter
Admission: EM | Disposition: A | Discharge status: Home Health | Payer: Self-pay | Source: Home / Self Care | Attending: Internal Medicine

## 2022-03-26 DIAGNOSIS — I5032 Chronic diastolic (congestive) heart failure: Secondary | ICD-10-CM | POA: Diagnosis not present

## 2022-03-26 DIAGNOSIS — I2510 Atherosclerotic heart disease of native coronary artery without angina pectoris: Secondary | ICD-10-CM | POA: Diagnosis not present

## 2022-03-26 DIAGNOSIS — F419 Anxiety disorder, unspecified: Secondary | ICD-10-CM | POA: Diagnosis not present

## 2022-03-26 DIAGNOSIS — I25110 Atherosclerotic heart disease of native coronary artery with unstable angina pectoris: Secondary | ICD-10-CM

## 2022-03-26 DIAGNOSIS — R079 Chest pain, unspecified: Secondary | ICD-10-CM | POA: Diagnosis not present

## 2022-03-26 DIAGNOSIS — I5033 Acute on chronic diastolic (congestive) heart failure: Secondary | ICD-10-CM | POA: Diagnosis not present

## 2022-03-26 DIAGNOSIS — I482 Chronic atrial fibrillation, unspecified: Secondary | ICD-10-CM | POA: Diagnosis not present

## 2022-03-26 DIAGNOSIS — Z86711 Personal history of pulmonary embolism: Secondary | ICD-10-CM | POA: Diagnosis not present

## 2022-03-26 HISTORY — PX: RIGHT/LEFT HEART CATH AND CORONARY ANGIOGRAPHY: CATH118266

## 2022-03-26 LAB — TYPE AND SCREEN
ABO/RH(D): A POS
Antibody Screen: NEGATIVE
Unit division: 0
Unit division: 0

## 2022-03-26 LAB — BASIC METABOLIC PANEL
Anion gap: 5 (ref 5–15)
BUN: 14 mg/dL (ref 8–23)
CO2: 23 mmol/L (ref 22–32)
Calcium: 8.1 mg/dL — ABNORMAL LOW (ref 8.9–10.3)
Chloride: 110 mmol/L (ref 98–111)
Creatinine, Ser: 0.84 mg/dL (ref 0.61–1.24)
GFR, Estimated: >60 mL/min (ref >60)
Glucose, Bld: 127 mg/dL — ABNORMAL HIGH (ref 70–99)
Potassium: 3.5 mmol/L (ref 3.5–5.1)
Sodium: 138 mmol/L (ref 135–145)

## 2022-03-26 LAB — BPAM RBC
Blood Product Expiration Date: 202307252359
Blood Product Expiration Date: 202308052359
ISSUE DATE / TIME: 202307171752
ISSUE DATE / TIME: 202307200915
Unit Type and Rh: 600
Unit Type and Rh: 6200

## 2022-03-26 LAB — GLUCOSE, CAPILLARY
Glucose-Capillary: 160 mg/dL — ABNORMAL HIGH (ref 70–99)
Glucose-Capillary: 274 mg/dL — ABNORMAL HIGH (ref 70–99)
Glucose-Capillary: 183 mg/dL — ABNORMAL HIGH (ref 70–99)
Glucose-Capillary: 163 mg/dL — ABNORMAL HIGH (ref 70–99)
Glucose-Capillary: 202 mg/dL — ABNORMAL HIGH (ref 70–99)

## 2022-03-26 LAB — CBC
HCT: 26.1 % — ABNORMAL LOW (ref 39.0–52.0)
Hemoglobin: 8.8 g/dL — ABNORMAL LOW (ref 13.0–17.0)
MCH: 29.9 pg (ref 26.0–34.0)
MCHC: 33.7 g/dL (ref 30.0–36.0)
MCV: 88.8 fL (ref 80.0–100.0)
Platelets: 64 10*3/uL — ABNORMAL LOW (ref 150–400)
RBC: 2.94 MIL/uL — ABNORMAL LOW (ref 4.22–5.81)
RDW: 18 % — ABNORMAL HIGH (ref 11.5–15.5)
WBC: 9 10*3/uL (ref 4.0–10.5)
nRBC: 0 % (ref 0.0–0.2)

## 2022-03-26 LAB — MAGNESIUM: Magnesium: 1.8 mg/dL (ref 1.7–2.4)

## 2022-03-26 SURGERY — RIGHT/LEFT HEART CATH AND CORONARY ANGIOGRAPHY
Anesthesia: Moderate Sedation

## 2022-03-26 MED ORDER — FENTANYL CITRATE (PF) 100 MCG/2ML IJ SOLN
INTRAMUSCULAR | Status: AC
  Filled 2022-03-26: qty 2

## 2022-03-26 MED ORDER — VERAPAMIL HCL 2.5 MG/ML IV SOLN
INTRAVENOUS | Status: DC | PRN
  Administered 2022-03-26: 2.5 mg via INTRA_ARTERIAL

## 2022-03-26 MED ORDER — SODIUM CHLORIDE 0.9 % WEIGHT BASED INFUSION
3.0000 mL/kg/h | INTRAVENOUS | Status: DC
Start: 2022-03-27 — End: 2022-03-26
  Administered 2022-03-26: 3 mL/kg/h via INTRAVENOUS

## 2022-03-26 MED ORDER — HEPARIN (PORCINE) IN NACL 1000-0.9 UT/500ML-% IV SOLN
INTRAVENOUS | Status: AC
  Filled 2022-03-26: qty 1000

## 2022-03-26 MED ORDER — DILTIAZEM HCL 25 MG/5ML IV SOLN
20.0000 mg | Freq: Once | INTRAVENOUS | Status: AC
  Administered 2022-03-26: 20 mg via INTRAVENOUS

## 2022-03-26 MED ORDER — VERAPAMIL HCL 2.5 MG/ML IV SOLN
INTRAVENOUS | Status: AC
  Filled 2022-03-26: qty 2

## 2022-03-26 MED ORDER — SODIUM CHLORIDE 0.9 % WEIGHT BASED INFUSION
1.0000 mL/kg/h | INTRAVENOUS | Status: DC
Start: 2022-03-27 — End: 2022-03-26
  Administered 2022-03-26: 1 mL/kg/h via INTRAVENOUS

## 2022-03-26 MED ORDER — FUROSEMIDE 10 MG/ML IJ SOLN
40.0000 mg | Freq: Once | INTRAMUSCULAR | Status: AC
  Administered 2022-03-26: 40 mg via INTRAVENOUS
  Filled 2022-03-26: qty 4

## 2022-03-26 MED ORDER — MIDAZOLAM HCL 2 MG/2ML IJ SOLN
INTRAMUSCULAR | Status: AC
  Filled 2022-03-26: qty 2

## 2022-03-26 MED ORDER — SODIUM CHLORIDE 0.9 % IV SOLN: 250.0000 mL | INTRAVENOUS | Status: DC | PRN

## 2022-03-26 MED ORDER — POTASSIUM CHLORIDE CRYS ER 20 MEQ PO TBCR
40.0000 meq | EXTENDED_RELEASE_TABLET | Freq: Once | ORAL | Status: AC
Start: 2022-03-26 — End: 2022-03-26
  Administered 2022-03-26: 40 meq via ORAL
  Filled 2022-03-26: qty 2

## 2022-03-26 MED ORDER — LIDOCAINE HCL (PF) 1 % IJ SOLN
INTRAMUSCULAR | Status: DC | PRN
  Administered 2022-03-26 (×2): 2 mL

## 2022-03-26 MED ORDER — LIDOCAINE HCL 1 % IJ SOLN
INTRAMUSCULAR | Status: AC
  Filled 2022-03-26: qty 20

## 2022-03-26 MED ORDER — HEPARIN SODIUM (PORCINE) 1000 UNIT/ML IJ SOLN
INTRAMUSCULAR | Status: AC
  Filled 2022-03-26: qty 10

## 2022-03-26 MED ORDER — MIDAZOLAM HCL 2 MG/2ML IJ SOLN
INTRAMUSCULAR | Status: DC | PRN
  Administered 2022-03-26: 1 mg via INTRAVENOUS

## 2022-03-26 MED ORDER — ASPIRIN 81 MG PO CHEW: 81.0000 mg | CHEWABLE_TABLET | ORAL | Status: DC

## 2022-03-26 MED ORDER — IOHEXOL 300 MG/ML  SOLN
INTRAMUSCULAR | Status: DC | PRN
  Administered 2022-03-26: 35 mL

## 2022-03-26 MED ORDER — HEPARIN (PORCINE) IN NACL 1000-0.9 UT/500ML-% IV SOLN
INTRAVENOUS | Status: DC | PRN
  Administered 2022-03-26: 1000 mL

## 2022-03-26 MED ORDER — HEPARIN SODIUM (PORCINE) 1000 UNIT/ML IJ SOLN
INTRAMUSCULAR | Status: DC | PRN
  Administered 2022-03-26: 5000 [IU] via INTRAVENOUS

## 2022-03-26 MED ORDER — METOPROLOL SUCCINATE ER 100 MG PO TB24
100.0000 mg | ORAL_TABLET | Freq: Every day | ORAL | Status: DC
  Administered 2022-03-26 – 2022-03-31 (×6): 100 mg via ORAL
  Filled 2022-03-26 (×6): qty 1

## 2022-03-26 MED ORDER — FUROSEMIDE 10 MG/ML IJ SOLN
40.0000 mg | Freq: Two times a day (BID) | INTRAMUSCULAR | Status: DC
  Administered 2022-03-26 – 2022-03-27 (×3): 40 mg via INTRAVENOUS
  Filled 2022-03-26 (×3): qty 4

## 2022-03-26 MED ORDER — MORPHINE SULFATE (PF) 2 MG/ML IV SOLN
2.0000 mg | Freq: Once | INTRAVENOUS | Status: AC
  Administered 2022-03-26: 2 mg via INTRAVENOUS
  Filled 2022-03-26: qty 1

## 2022-03-26 MED ORDER — FENTANYL CITRATE (PF) 100 MCG/2ML IJ SOLN
INTRAMUSCULAR | Status: DC | PRN
  Administered 2022-03-26: 25 ug via INTRAVENOUS

## 2022-03-26 MED ORDER — SODIUM CHLORIDE 0.9% FLUSH
3.0000 mL | Freq: Two times a day (BID) | INTRAVENOUS | Status: DC
  Administered 2022-03-26 – 2022-03-31 (×10): 3 mL via INTRAVENOUS

## 2022-03-26 MED ORDER — PANTOPRAZOLE SODIUM 40 MG PO TBEC
40.0000 mg | DELAYED_RELEASE_TABLET | Freq: Two times a day (BID) | ORAL | Status: DC
  Administered 2022-03-26 – 2022-03-31 (×10): 40 mg via ORAL
  Filled 2022-03-26 (×10): qty 1

## 2022-03-26 MED ORDER — DILTIAZEM HCL-DEXTROSE 125-5 MG/125ML-% IV SOLN (PREMIX): 5.0000 mg/h | INTRAVENOUS | Status: DC

## 2022-03-26 MED ORDER — MAGNESIUM SULFATE 2 GM/50ML IV SOLN
2.0000 g | Freq: Once | INTRAVENOUS | Status: AC
  Administered 2022-03-26: 2 g via INTRAVENOUS
  Filled 2022-03-26: qty 50

## 2022-03-26 MED ORDER — SODIUM CHLORIDE 0.9% FLUSH: 3.0000 mL | INTRAVENOUS | Status: DC | PRN

## 2022-03-26 MED ORDER — DILTIAZEM HCL 25 MG/5ML IV SOLN
INTRAVENOUS | Status: AC
  Filled 2022-03-26: qty 5

## 2022-03-26 MED ORDER — ORAL CARE MOUTH RINSE
15.0000 mL | OROMUCOSAL | Status: DC | PRN
Start: 2022-03-26 — End: 2022-03-31

## 2022-03-26 SURGICAL SUPPLY — 14 items
BAND ZEPHYR COMPRESS 30 LONG (HEMOSTASIS) ×1 IMPLANT
CATH 5F 110X4 TIG (CATHETERS) ×1 IMPLANT
CATH BALLN WEDGE 5F 110CM (CATHETERS) ×1 IMPLANT
CATH INFINITI 5FR JL4 (CATHETERS) ×1 IMPLANT
DRAPE BRACHIAL (DRAPES) ×2 IMPLANT
GLIDESHEATH SLEND SS 6F .021 (SHEATH) ×1 IMPLANT
GUIDEWIRE .025 260CM (WIRE) ×1 IMPLANT
GUIDEWIRE INQWIRE 1.5J.035X260 (WIRE) IMPLANT
INQWIRE 1.5J .035X260CM (WIRE) ×2
PACK CARDIAC CATH (CUSTOM PROCEDURE TRAY) ×2 IMPLANT
PROTECTION STATION PRESSURIZED (MISCELLANEOUS) ×2
SET ATX SIMPLICITY (MISCELLANEOUS) ×1 IMPLANT
SHEATH GLIDE SLENDER 4/5FR (SHEATH) ×1 IMPLANT
STATION PROTECTION PRESSURIZED (MISCELLANEOUS) IMPLANT

## 2022-03-26 NOTE — Progress Notes (Signed)
[employer] - nurse reports patient with sudden onset 10/10 chest pain, tachycardia and crackles bilaterally. No relief from additional imitial morphine given EKG - reviewed by me.artifact diminishing ST evaluation some however shows afib with RVR, no acute ST abnormalities.  Action  Additional morphine and lasix dose odered  Response  Will need follow up

## 2022-03-26 NOTE — H&P (View-Only) (Signed)
Progress Note  Patient Name: Cody Price Date of Encounter: 03/26/2022  [facility] Cardiologist: Ida Rogue, MD   Subjective   Hgb improved today after blood, up to 8.8 today He reports non-bloody BM. He is still feeling weak. The nurse reported he had chest pain overnight. He feels short of breath.   Inpatient Medications    Scheduled Meds:  sodium chloride   Intravenous Once   aspirin EC  81 mg Oral Daily   atorvastatin  40 mg Oral Daily   cyanocobalamin  1,000 mcg Intramuscular Q30 days   finasteride  5 mg Oral Daily   furosemide  40 mg Intravenous BID   insulin aspart  0-5 Units Subcutaneous QHS   insulin aspart  0-9 Units Subcutaneous TID WC   metoprolol succinate  50 mg Oral Daily   multivitamin with minerals  1 tablet Oral Daily   omega-3 acid ethyl esters  2 g Oral Daily   pantoprazole (PROTONIX) IV  40 mg Intravenous Q12H   polyethylene glycol  17 g Oral Daily   tamsulosin  0.4 mg Oral Daily   Continuous Infusions:  PRN Meds: oxyCODONE **AND** acetaminophen, acetaminophen, bisacodyl, dextromethorphan-guaiFENesin, diazepam, hydrALAZINE, ipratropium-albuterol, melatonin, methocarbamol, morphine injection, ondansetron (ZOFRAN) IV   Vital Signs    Vitals:   03/26/22 0545 03/26/22 0602 03/26/22 0617 03/26/22 0632  BP: 124/71 124/60 127/65 132/65  Pulse: 88 (!) 101 100 100  Resp:      Temp:      TempSrc:      SpO2: 97% 94% (!) 89% 96%  Weight:        Intake/Output Summary (Last 24 hours) at 03/26/2022 0812 Last data filed at 03/26/2022 5427 Gross per 24 hour  Intake 484 ml  Output 1750 ml  Net -1266 ml      03/26/2022    3:59 AM 03/25/2022    3:42 AM 03/24/2022    5:38 AM  Last 3 Weights  Weight (lbs) 235 lb 0.2 oz 244 lb 11.4 oz 244 lb 7.8 oz  Weight (kg) 106.6 kg 111 kg 110.9 kg      Telemetry    Aiv HR 90-110, PVCs - Personally Reviewed  ECG    No new - Personally Reviewed  Physical Exam   GEN: No acute distress.   Neck: No  JVD Cardiac: Irreg Irreg, no murmurs, rubs, or gallops.  Respiratory: Clear to auscultation bilaterally. GI: Soft, nontender, non-distended  MS: No edema; No deformity. Neuro:  Nonfocal  Psych: Normal affect   Labs    High Sensitivity Troponin:   Recent Labs  Lab 03/22/22 1021 03/22/22 1227 03/22/22 1430 03/22/22 1820 03/22/22 2252  TROPONINIHS 69* 72* 66* 62* 72*     Chemistry Recent Labs  Lab 03/24/22 0439 03/25/22 0431 03/26/22 0438  NA 137 137 138  K 3.8 3.2* 3.5  CL 108 108 110  CO2 '25 22 23  '$ GLUCOSE 117* 121* 127*  BUN '17 17 14  '$ CREATININE 0.90 0.99 0.84  CALCIUM 8.0* 8.0* 8.1*  MG  --  1.7 1.8  GFRNONAA >60 >60 >60  ANIONGAP 4* 7 5    Lipids  Recent Labs  Lab 03/22/22 1025  CHOL 62  TRIG 75  HDL 20*  LDLCALC 27  CHOLHDL 3.1    Hematology Recent Labs  Lab 03/24/22 0439 03/25/22 0431 03/25/22 1409 03/26/22 0438  WBC 8.7 7.5  --  9.0  RBC 2.63* 2.52*  --  2.94*  HGB 7.8* 7.5* 8.5* 8.8*  HCT 23.1* 22.2* 25.8* 26.1*  MCV 87.8 88.1  --  88.8  MCH 29.7 29.8  --  29.9  MCHC 33.8 33.8  --  33.7  RDW 18.0* 18.1*  --  18.0*  PLT 59* 62*  --  64*   Thyroid No results for input(s): "TSH", "FREET4" in the last 168 hours.  BNP Recent Labs  Lab 03/22/22 1015  BNP 431.0*    DDimer No results for input(s): "DDIMER" in the last 168 hours.   Radiology    No results found.  Cardiac Studies   Echocardiogram completed 03/23/2022 1. Left ventricular ejection fraction, by estimation, is 50 to 55%. The  left ventricle has low normal function. The left ventricle demonstrates  regional wall motion abnormalities (see scoring diagram/findings for  description). There is moderate left  ventricular hypertrophy. Left ventricular diastolic function could not be  evaluated. There is mild hypokinesis of the left ventricular, basal  inferior and inferolateral segments.   2. Right ventricular systolic function is normal. The right ventricular  size is mildly  enlarged. There is moderately elevated pulmonary artery  systolic pressure. The estimated right ventricular systolic pressure is  62.9 mmHg.   3. Left atrial size was mildly dilated.   4. Right atrial size was mildly dilated.   5. The mitral valve is abnormal. Mild to moderate mitral valve  regurgitation.   6. Tricuspid valve regurgitation is mild to moderate.   7. The aortic valve is tricuspid. There is mild thickening of the aortic  valve. Aortic valve regurgitation is not visualized. Aortic valve  sclerosis is present, with no evidence of aortic valve stenosis.   8. Aortic dilatation noted. There is borderline dilatation of the aortic  root and of the ascending aorta, measuring 38 mm.   9. The inferior vena cava is dilated in size with <50% respiratory  variability, suggesting right atrial pressure of 15 mmHg.    LHC 08/2021    Dist LAD lesion is 80% stenosed.   Prox LAD-1 lesion is 10% stenosed.   Prox Cx lesion is 50% stenosed.   Dist RCA lesion is 100% stenosed.   Ost 1st Diag lesion is 90% stenosed.   Prox RCA lesion is 99% stenosed.   Ost 3rd Mrg to 3rd Mrg lesion is 50% stenosed.   Mid LAD lesion is 10% stenosed.   Ost LM lesion is 20% stenosed.   Non-stenotic Prox LAD-2 lesion was previously treated.   The left ventricular systolic function is normal.   LV end diastolic pressure is moderately elevated.   The left ventricular ejection fraction is 50-55% by visual estimate.   1.  Patent LAD stents with no significant restenosis.  Chronically occluded right coronary artery with left-to-right collaterals and moderate left circumflex disease.  No significant change in coronary anatomy since most recent cardiac catheterization. 2.  Low normal LV systolic function with an EF of 50 to 55%.  Moderately elevated left ventricular end-diastolic pressure at 22 mmHg.   Recommendations: Elevated troponin is likely due to supply demand ischemia. Recommend continuing medical  therapy. Eliquis can be resumed tonight. The patient is volume overloaded and I elected to resume furosemide 40 mg by mouth twice daily.  Patient Profile     81 y.o. male  with a past medical history of coronary artery disease s/p multiple PCI's, chronic diastolic heart failure, permanent atrial fibrillation, DM II, tobacco use, essential hypertension, and hyperlipidemia who is being seen and evaluated for chest pain with possible unstable angina.  Assessment & Plan    NSTEMI - reportedly chest pain overnight - HS trop peak 72 - IV heparin stopped post-EGD for persistently low Hgb. HGB better today after 1 unit PRBC, Hgb 8.8 today - continue ASA, statin and BB therapy - LHC in 08/2021 showed patent LAD stents with no significant disease with chronically occluded RCA with L>R collaterals and moderate left Cx disease - Prior discussion of LHC especially given chest pain, however may not be the best candidate given anemia and weakness. May need to re-visit once anemia has resolved. Will discuss with MD.    Acute on chronic diastolic heart failure - IV lasix '40mg'$  BID - Echo showed LVEF 50-55% with subtle basal inferior/inferolateral HK, moderate pulmonary HTN - Net -4.3L - kidney function stable - replete K - continue Toprol    Permanent Afib - Eliquis on hold for anemia> will discuss restarting a/c with MD. May need further GI work-up prior to re-initiation of eliquis - afib rates are relatively controlled   Anemia - EGD did  not show active bleeding. Due to hypotension during procedure no plan for colonoscopy.  - s/p 2 units PRBCs - Hgb 7.5>8.8 - per GI   HTN - BP intermittently soft at times - continue Toprol '50mg'$  daily  For questions or updates, please contact [facility] Please consult [email or web address] for contact info under        Signed, Jeneal Vogl Ninfa Meeker, PA-C  03/26/2022, 8:12 AM

## 2022-03-26 NOTE — Progress Notes (Signed)
Progress Note  Patient Name: Cody Price Date of Encounter: 03/26/2022  [facility] Cardiologist: Ida Rogue, MD   Subjective   Hgb improved today after blood, up to 8.8 today He reports non-bloody BM. He is still feeling weak. The nurse reported he had chest pain overnight. He feels short of breath.   Inpatient Medications    Scheduled Meds:  sodium chloride   Intravenous Once   aspirin EC  81 mg Oral Daily   atorvastatin  40 mg Oral Daily   cyanocobalamin  1,000 mcg Intramuscular Q30 days   finasteride  5 mg Oral Daily   furosemide  40 mg Intravenous BID   insulin aspart  0-5 Units Subcutaneous QHS   insulin aspart  0-9 Units Subcutaneous TID WC   metoprolol succinate  50 mg Oral Daily   multivitamin with minerals  1 tablet Oral Daily   omega-3 acid ethyl esters  2 g Oral Daily   pantoprazole (PROTONIX) IV  40 mg Intravenous Q12H   polyethylene glycol  17 g Oral Daily   tamsulosin  0.4 mg Oral Daily   Continuous Infusions:  PRN Meds: oxyCODONE **AND** acetaminophen, acetaminophen, bisacodyl, dextromethorphan-guaiFENesin, diazepam, hydrALAZINE, ipratropium-albuterol, melatonin, methocarbamol, morphine injection, ondansetron (ZOFRAN) IV   Vital Signs    Vitals:   03/26/22 0545 03/26/22 0602 03/26/22 0617 03/26/22 0632  BP: 124/71 124/60 127/65 132/65  Pulse: 88 (!) 101 100 100  Resp:      Temp:      TempSrc:      SpO2: 97% 94% (!) 89% 96%  Weight:        Intake/Output Summary (Last 24 hours) at 03/26/2022 0812 Last data filed at 03/26/2022 7408 Gross per 24 hour  Intake 484 ml  Output 1750 ml  Net -1266 ml      03/26/2022    3:59 AM 03/25/2022    3:42 AM 03/24/2022    5:38 AM  Last 3 Weights  Weight (lbs) 235 lb 0.2 oz 244 lb 11.4 oz 244 lb 7.8 oz  Weight (kg) 106.6 kg 111 kg 110.9 kg      Telemetry    Aiv HR 90-110, PVCs - Personally Reviewed  ECG    No new - Personally Reviewed  Physical Exam   GEN: No acute distress.   Neck: No  JVD Cardiac: Irreg Irreg, no murmurs, rubs, or gallops.  Respiratory: Clear to auscultation bilaterally. GI: Soft, nontender, non-distended  MS: No edema; No deformity. Neuro:  Nonfocal  Psych: Normal affect   Labs    High Sensitivity Troponin:   Recent Labs  Lab 03/22/22 1021 03/22/22 1227 03/22/22 1430 03/22/22 1820 03/22/22 2252  TROPONINIHS 69* 72* 66* 62* 72*     Chemistry Recent Labs  Lab 03/24/22 0439 03/25/22 0431 03/26/22 0438  NA 137 137 138  K 3.8 3.2* 3.5  CL 108 108 110  CO2 '25 22 23  '$ GLUCOSE 117* 121* 127*  BUN '17 17 14  '$ CREATININE 0.90 0.99 0.84  CALCIUM 8.0* 8.0* 8.1*  MG  --  1.7 1.8  GFRNONAA >60 >60 >60  ANIONGAP 4* 7 5    Lipids  Recent Labs  Lab 03/22/22 1025  CHOL 62  TRIG 75  HDL 20*  LDLCALC 27  CHOLHDL 3.1    Hematology Recent Labs  Lab 03/24/22 0439 03/25/22 0431 03/25/22 1409 03/26/22 0438  WBC 8.7 7.5  --  9.0  RBC 2.63* 2.52*  --  2.94*  HGB 7.8* 7.5* 8.5* 8.8*  HCT 23.1* 22.2* 25.8* 26.1*  MCV 87.8 88.1  --  88.8  MCH 29.7 29.8  --  29.9  MCHC 33.8 33.8  --  33.7  RDW 18.0* 18.1*  --  18.0*  PLT 59* 62*  --  64*   Thyroid No results for input(s): "TSH", "FREET4" in the last 168 hours.  BNP Recent Labs  Lab 03/22/22 1015  BNP 431.0*    DDimer No results for input(s): "DDIMER" in the last 168 hours.   Radiology    No results found.  Cardiac Studies   Echocardiogram completed 03/23/2022 1. Left ventricular ejection fraction, by estimation, is 50 to 55%. The  left ventricle has low normal function. The left ventricle demonstrates  regional wall motion abnormalities (see scoring diagram/findings for  description). There is moderate left  ventricular hypertrophy. Left ventricular diastolic function could not be  evaluated. There is mild hypokinesis of the left ventricular, basal  inferior and inferolateral segments.   2. Right ventricular systolic function is normal. The right ventricular  size is mildly  enlarged. There is moderately elevated pulmonary artery  systolic pressure. The estimated right ventricular systolic pressure is  36.1 mmHg.   3. Left atrial size was mildly dilated.   4. Right atrial size was mildly dilated.   5. The mitral valve is abnormal. Mild to moderate mitral valve  regurgitation.   6. Tricuspid valve regurgitation is mild to moderate.   7. The aortic valve is tricuspid. There is mild thickening of the aortic  valve. Aortic valve regurgitation is not visualized. Aortic valve  sclerosis is present, with no evidence of aortic valve stenosis.   8. Aortic dilatation noted. There is borderline dilatation of the aortic  root and of the ascending aorta, measuring 38 mm.   9. The inferior vena cava is dilated in size with <50% respiratory  variability, suggesting right atrial pressure of 15 mmHg.    LHC 08/2021    Dist LAD lesion is 80% stenosed.   Prox LAD-1 lesion is 10% stenosed.   Prox Cx lesion is 50% stenosed.   Dist RCA lesion is 100% stenosed.   Ost 1st Diag lesion is 90% stenosed.   Prox RCA lesion is 99% stenosed.   Ost 3rd Mrg to 3rd Mrg lesion is 50% stenosed.   Mid LAD lesion is 10% stenosed.   Ost LM lesion is 20% stenosed.   Non-stenotic Prox LAD-2 lesion was previously treated.   The left ventricular systolic function is normal.   LV end diastolic pressure is moderately elevated.   The left ventricular ejection fraction is 50-55% by visual estimate.   1.  Patent LAD stents with no significant restenosis.  Chronically occluded right coronary artery with left-to-right collaterals and moderate left circumflex disease.  No significant change in coronary anatomy since most recent cardiac catheterization. 2.  Low normal LV systolic function with an EF of 50 to 55%.  Moderately elevated left ventricular end-diastolic pressure at 22 mmHg.   Recommendations: Elevated troponin is likely due to supply demand ischemia. Recommend continuing medical  therapy. Eliquis can be resumed tonight. The patient is volume overloaded and I elected to resume furosemide 40 mg by mouth twice daily.  Patient Profile     81 y.o. male  with a past medical history of coronary artery disease s/p multiple PCI's, chronic diastolic heart failure, permanent atrial fibrillation, DM II, tobacco use, essential hypertension, and hyperlipidemia who is being seen and evaluated for chest pain with possible unstable angina.  Assessment & Plan    NSTEMI - reportedly chest pain overnight - HS trop peak 72 - IV heparin stopped post-EGD for persistently low Hgb. HGB better today after 1 unit PRBC, Hgb 8.8 today - continue ASA, statin and BB therapy - LHC in 08/2021 showed patent LAD stents with no significant disease with chronically occluded RCA with L>R collaterals and moderate left Cx disease - Prior discussion of LHC especially given chest pain, however may not be the best candidate given anemia and weakness. May need to re-visit once anemia has resolved. Will discuss with MD.    Acute on chronic diastolic heart failure - IV lasix '40mg'$  BID - Echo showed LVEF 50-55% with subtle basal inferior/inferolateral HK, moderate pulmonary HTN - Net -4.3L - kidney function stable - replete K - continue Toprol    Permanent Afib - Eliquis on hold for anemia> will discuss restarting a/c with MD. May need further GI work-up prior to re-initiation of eliquis - afib rates are relatively controlled   Anemia - EGD did  not show active bleeding. Due to hypotension during procedure no plan for colonoscopy.  - s/p 2 units PRBCs - Hgb 7.5>8.8 - per GI   HTN - BP intermittently soft at times - continue Toprol '50mg'$  daily  For questions or updates, please contact [facility] Please consult [email or web address] for contact info under        Signed, Aluel Schwarz Ninfa Meeker, PA-C  03/26/2022, 8:12 AM

## 2022-03-26 NOTE — Interval H&P Note (Signed)
History and Physical Interval Note:  03/26/2022 2:32 PM  Cody Price  has presented today for surgery, with the diagnosis of unstable angina.  The various methods of treatment have been discussed with the patient and family. After consideration of risks, benefits and other options for treatment, the patient has consented to  Procedure(s): RIGHT/LEFT HEART CATH AND CORONARY ANGIOGRAPHY (N/A) as a surgical intervention.  The patient's history has been reviewed, patient examined, no change in status, stable for surgery.  I have reviewed the patient's chart and labs.  Questions were answered to the patient's satisfaction.     Kathlyn Sacramento

## 2022-03-26 NOTE — Plan of Care (Signed)
  Problem: Coping: Goal: Ability to adjust to condition or change in health will improve Outcome: Not Progressing   Problem: Pain Managment: Goal: General experience of comfort will improve Outcome: Not Progressing

## 2022-03-26 NOTE — Progress Notes (Addendum)
Progress Note    Cody Price  UVO:[id number] DOB: 1941/01/18  DOA: 03/22/2022 PCP: Baxter Hire, MD      Brief Narrative:    Medical records reviewed and are as summarized below:  Cody Price is a 81 y.o. male with medical history significant of CAD with stent, hypertension, hyperlipidemia, diabetes mellitus, atrial fibrillation and PE on Eliquis, BPH, kidney stone, thrombocytopenia, peptic ulcer disease, chronic diastolic CHF, who presented to the hospital with intermittent chest pain, exertional shortness of breath.  He also reported dark stools which she attributed to some juice.      Assessment/Plan:   Principal Problem:   Chest pain Active Problems:   CAD (coronary artery disease)   Chronic diastolic CHF (congestive heart failure) (HCC)   Atrial fibrillation, chronic (HCC)   Hx of pulmonary embolus   Diabetes mellitus without complication (HCC)   Thrombocytopenia (HCC)   BPH with obstruction/lower urinary tract symptoms   Normocytic anemia   Hypertension   HLD (hyperlipidemia)   Anxiety   Acute on chronic heart failure with preserved ejection fraction (HFpEF) (HCC)    Body mass index is 30.17 kg/m.  (Obesity)  Iron deficiency anemia, symptomatic anemia: Hemoglobin is still trending down.  S/p transfusion with 1 unit of PRBCs on 03/25/2022.  S/p transfusion with 1 units of PRBCs 03/24/2022.  S/p transfusion with 1 unit of PRBCs on 03/22/2022.  No overt GI bleeding.  S/p EGD on 03/24/2022 which showed single gastric polyp.  No plan for colonoscopy per Dr. Virgina Jock, gastroenterologist.  Patient's blood pressure dropped during EGD requiring Neo-Synephrine..  Elevated troponin, CAD with PCI to LAD (CTO dRCA, 80% distal LAD, 50% prox Lcx): No NSTEMI per cardiologist.  Patient underwent left and right heart cath today.  This revealed chronically occluded right ICA with collaterals and elevated wedge pressure.    Chronic atrial fibrillation with RVR: Heart rate went  up to 175 today.  He was given IV Cardizem 20 mg bolus x1 dose.  Heart rate slowed down after IV Cardizem bolus.  Cardizem drip that was ordered was not given because heart rate had improved.  Toprol-XL was increased from 50 to 100 mg daily.  Hypokalemia: Potassium 3.5.  Continue potassium repletion because of A-fib.  Magnesium was 1.8.  Replete magnesium with IV mag sulfate because of rapid A-fib  Acute on chronic diastolic CHF: Improved.  Continue IV Lasix.  2D echo in 03/23/2022 showed EF estimated at 50 to 55%, moderate LVH, moderate to elevated pulmonary artery systolic pressure, mild to moderate MR, mild to moderate TR.  History of pulmonary embolism: Eliquis is on hold.  Discussed goals of care with the patient.  He prefers to continue full scope of care at this time.    Diet Order             Diet Heart Room service appropriate? Yes; Fluid consistency: Thin  Diet effective now                            Consultants: Cardiologist Gastroenterologist  Procedures: EGD on 03/24/2022 Left and right heart cath on 03/26/2022    Medications:    aspirin EC  81 mg Oral Daily   atorvastatin  40 mg Oral Daily   cyanocobalamin  1,000 mcg Intramuscular Q30 days   diltiazem  20 mg Intravenous Once   finasteride  5 mg Oral Daily   furosemide  40 mg Intravenous BID  insulin aspart  0-5 Units Subcutaneous QHS   insulin aspart  0-9 Units Subcutaneous TID WC   metoprolol succinate  50 mg Oral Daily   multivitamin with minerals  1 tablet Oral Daily   omega-3 acid ethyl esters  2 g Oral Daily   pantoprazole (PROTONIX) IV  40 mg Intravenous Q12H   polyethylene glycol  17 g Oral Daily   tamsulosin  0.4 mg Oral Daily   Continuous Infusions:     Anti-infectives (From admission, onward)    None              Family Communication/Anticipated D/C date and plan/Code Status   DVT prophylaxis:      Code Status: Full Code  Family Communication:  None Disposition Plan: Plan to discharge home in 1 to 2 days   Status is: Inpatient Remains inpatient appropriate because: Symptomatic anemia       Subjective:   Interval events noted.  He complains of chest pain, generalized weakness and fatigue.  Heart rate went up to 175 and telemetry showed rapid A-fib.  Objective:    Vitals:   03/26/22 0545 03/26/22 0602 03/26/22 0617 03/26/22 0632  BP: 124/71 124/60 127/65 132/65  Pulse: 88 (!) 101 100 100  Resp:      Temp:      TempSrc:      SpO2: 97% 94% (!) 89% 96%  Weight:       No data found.   Intake/Output Summary (Last 24 hours) at 03/26/2022 0856 Last data filed at 03/26/2022 2202 Gross per 24 hour  Intake 484 ml  Output 1750 ml  Net -1266 ml   Filed Weights   03/24/22 0538 03/25/22 0342 03/26/22 0359  Weight: 110.9 kg 111 kg 106.6 kg    Exam:  GEN: NAD SKIN: Warm and dry EYES: No pallor or icterus ENT: MMM CV: Irregular rate and rhythm, tachycardic PULM: CTA B ABD: soft, obese, NT, +BS CNS: AAO x 3, non focal EXT: No edema or tenderness        Data Reviewed:   I have personally reviewed following labs and imaging studies:  Labs: Labs show the following:   Basic Metabolic Panel: Recent Labs  Lab 03/22/22 1021 03/23/22 0829 03/24/22 0439 03/25/22 0431 03/26/22 0438  NA 135 136 137 137 138  K 4.5 3.8 3.8 3.2* 3.5  CL 106 106 108 108 110  CO2 21* '24 25 22 23  '$ GLUCOSE 249* 133* 117* 121* 127*  BUN '21 18 17 17 14  '$ CREATININE 1.15 0.92 0.90 0.99 0.84  CALCIUM 8.5* 8.1* 8.0* 8.0* 8.1*  MG  --   --   --  1.7 1.8   GFR Estimated Creatinine Clearance: 91.3 mL/min (by C-G formula based on SCr of 0.84 mg/dL). Liver Function Tests: No results for input(s): "AST", "ALT", "ALKPHOS", "BILITOT", "PROT", "ALBUMIN" in the last 168 hours. No results for input(s): "LIPASE", "AMYLASE" in the last 168 hours. No results for input(s): "AMMONIA" in the last 168 hours. Coagulation profile Recent Labs   Lab 03/22/22 1430  INR 2.0*    CBC: Recent Labs  Lab 03/22/22 2252 03/23/22 0727 03/24/22 0439 03/25/22 0431 03/25/22 1409 03/26/22 0438  WBC 10.1 8.8 8.7 7.5  --  9.0  HGB 8.5* 8.3* 7.8* 7.5* 8.5* 8.8*  HCT 24.3* 23.8* 23.1* 22.2* 25.8* 26.1*  MCV 86.5 86.9 87.8 88.1  --  88.8  PLT 62* 63* 59* 62*  --  64*   Cardiac Enzymes: No results for input(s): "  CKTOTAL", "CKMB", "CKMBINDEX", "TROPONINI" in the last 168 hours. BNP (last 3 results) No results for input(s): "PROBNP" in the last 8760 hours. CBG: Recent Labs  Lab 03/25/22 0814 03/25/22 1146 03/25/22 1636 03/25/22 2114 03/26/22 0752  GLUCAP 131* 205* 243* 205* 160*   D-Dimer: No results for input(s): "DDIMER" in the last 72 hours. Hgb A1c: No results for input(s): "HGBA1C" in the last 72 hours.  Lipid Profile: No results for input(s): "CHOL", "HDL", "LDLCALC", "TRIG", "CHOLHDL", "LDLDIRECT" in the last 72 hours.  Thyroid function studies: No results for input(s): "TSH", "T4TOTAL", "T3FREE", "THYROIDAB" in the last 72 hours.  Invalid input(s): "FREET3" Anemia work up: No results for input(s): "VITAMINB12", "FOLATE", "FERRITIN", "TIBC", "IRON", "RETICCTPCT" in the last 72 hours.  Sepsis Labs: Recent Labs  Lab 03/23/22 0727 03/24/22 0439 03/25/22 0431 03/26/22 0438  WBC 8.8 8.7 7.5 9.0    Microbiology No results found for this or any previous visit (from the past 240 hour(s)).  Procedures and diagnostic studies:  No results found.             LOS: 4 days   Maliyah Willets  [facility]   Pager on [email or web address]. If 7PM-7AM, please contact night-coverage at [email or web address]     03/26/2022, 8:56 AM

## 2022-03-26 NOTE — Progress Notes (Addendum)
Patient awaken complaining of shortness of breath and chest pain 10 out of 10. Administered morphine and duoneb. Patient is allergic to nitroglycerin. Will continue to monitor.  Notified Randol Kern NP of current status. Patient continues to be short of breath and having chest pain. An additional '2mg'$  of morphine was given along with '40mg'$  of iv lasix. EKG was unremarkable. Will continue to monitor.

## 2022-03-26 NOTE — Progress Notes (Signed)
Inpatient Diabetes Program Recommendations  AACE/ADA: New Consensus Statement on Inpatient Glycemic Control (2015)  Target Ranges:  Prepandial:   less than 140 mg/dL      Peak postprandial:   less than 180 mg/dL (1-2 hours)      Critically ill patients:  140 - 180 mg/dL   Lab Results  Component Value Date   GLUCAP 274 (H) 03/26/2022   HGBA1C 7.6 (H) 03/22/2022    Review of Glycemic Control  Latest Reference Range & Units 03/26/22 07:52 03/26/22 11:43  Glucose-Capillary 70 - 99 mg/dL 160 (H) 274 (H)   Diabetes history: DM 2 Outpatient Diabetes medications:  Amaryl 4 mg bid Current orders for Inpatient glycemic control:  Novolog sensitive tid with meals and HS Inpatient Diabetes Program Recommendations:   Consider changing Novolog correction to q 4 hours while NPO.   Thanks,  Adah Perl, RN, BC-ADM Inpatient Diabetes Coordinator Pager [phone]  (8a-5p)

## 2022-03-27 DIAGNOSIS — I5041 Acute combined systolic (congestive) and diastolic (congestive) heart failure: Secondary | ICD-10-CM

## 2022-03-27 DIAGNOSIS — I10 Essential (primary) hypertension: Secondary | ICD-10-CM | POA: Diagnosis not present

## 2022-03-27 DIAGNOSIS — D649 Anemia, unspecified: Secondary | ICD-10-CM | POA: Diagnosis not present

## 2022-03-27 DIAGNOSIS — R079 Chest pain, unspecified: Secondary | ICD-10-CM | POA: Diagnosis not present

## 2022-03-27 DIAGNOSIS — F419 Anxiety disorder, unspecified: Secondary | ICD-10-CM | POA: Diagnosis not present

## 2022-03-27 DIAGNOSIS — E785 Hyperlipidemia, unspecified: Secondary | ICD-10-CM | POA: Diagnosis not present

## 2022-03-27 DIAGNOSIS — I5033 Acute on chronic diastolic (congestive) heart failure: Secondary | ICD-10-CM | POA: Diagnosis not present

## 2022-03-27 DIAGNOSIS — I482 Chronic atrial fibrillation, unspecified: Secondary | ICD-10-CM | POA: Diagnosis not present

## 2022-03-27 LAB — CBC
HCT: 27.1 % — ABNORMAL LOW (ref 39.0–52.0)
Hemoglobin: 8.9 g/dL — ABNORMAL LOW (ref 13.0–17.0)
MCH: 29.5 pg (ref 26.0–34.0)
MCHC: 32.8 g/dL (ref 30.0–36.0)
MCV: 89.7 fL (ref 80.0–100.0)
Platelets: 75 10*3/uL — ABNORMAL LOW (ref 150–400)
RBC: 3.02 MIL/uL — ABNORMAL LOW (ref 4.22–5.81)
RDW: 18.6 % — ABNORMAL HIGH (ref 11.5–15.5)
WBC: 8.9 10*3/uL (ref 4.0–10.5)
nRBC: 0 % (ref 0.0–0.2)

## 2022-03-27 LAB — BASIC METABOLIC PANEL
Anion gap: 9 (ref 5–15)
BUN: 15 mg/dL (ref 8–23)
CO2: 23 mmol/L (ref 22–32)
Calcium: 8.3 mg/dL — ABNORMAL LOW (ref 8.9–10.3)
Chloride: 105 mmol/L (ref 98–111)
Creatinine, Ser: 0.89 mg/dL (ref 0.61–1.24)
GFR, Estimated: >60 mL/min (ref >60)
Glucose, Bld: 235 mg/dL — ABNORMAL HIGH (ref 70–99)
Potassium: 3.7 mmol/L (ref 3.5–5.1)
Sodium: 137 mmol/L (ref 135–145)

## 2022-03-27 LAB — GLUCOSE, CAPILLARY
Glucose-Capillary: 136 mg/dL — ABNORMAL HIGH (ref 70–99)
Glucose-Capillary: 274 mg/dL — ABNORMAL HIGH (ref 70–99)
Glucose-Capillary: 221 mg/dL — ABNORMAL HIGH (ref 70–99)
Glucose-Capillary: 256 mg/dL — ABNORMAL HIGH (ref 70–99)

## 2022-03-27 MED ORDER — HEPARIN SODIUM (PORCINE) 5000 UNIT/ML IJ SOLN
5000.0000 [IU] | Freq: Three times a day (TID) | INTRAMUSCULAR | Status: DC
  Administered 2022-03-27 – 2022-03-29 (×6): 5000 [IU] via SUBCUTANEOUS
  Filled 2022-03-27 (×6): qty 1

## 2022-03-27 MED ORDER — FUROSEMIDE 10 MG/ML IJ SOLN
80.0000 mg | Freq: Two times a day (BID) | INTRAMUSCULAR | Status: DC
  Administered 2022-03-27 – 2022-03-31 (×8): 80 mg via INTRAVENOUS
  Filled 2022-03-27 (×8): qty 8

## 2022-03-27 MED ORDER — SODIUM CHLORIDE 0.9 % IV SOLN
200.0000 mg | Freq: Every day | INTRAVENOUS | Status: AC
  Administered 2022-03-27 – 2022-03-28 (×2): 200 mg via INTRAVENOUS
  Filled 2022-03-27 (×2): qty 200
  Filled 2022-03-27: qty 10

## 2022-03-27 NOTE — Progress Notes (Addendum)
Progress Note    Cody Price  DXI:[id number] DOB: [birth year]  DOA: 03/22/2022 PCP: Baxter Hire, MD      Brief Narrative:    Medical records reviewed and are as summarized below:  [name] is a 81 y.o. male with medical history significant of CAD with stent, hypertension, hyperlipidemia, diabetes mellitus, atrial fibrillation and PE on Eliquis, BPH, kidney stone, thrombocytopenia, peptic ulcer disease, chronic diastolic CHF, who presented to the hospital with intermittent chest pain, exertional shortness of breath.  He also reported dark stools which she attributed to some juice.      Assessment/Plan:   Principal Problem:   Chest pain Active Problems:   CAD (coronary artery disease)   Chronic diastolic CHF (congestive heart failure) (HCC)   Atrial fibrillation, chronic (HCC)   Hx of pulmonary embolus   Diabetes mellitus without complication (HCC)   Thrombocytopenia (HCC)   BPH with obstruction/lower urinary tract symptoms   Normocytic anemia   Hypertension   HLD (hyperlipidemia)   Anxiety   Acute on chronic heart failure with preserved ejection fraction (HFpEF) (HCC)   Acute combined systolic and diastolic heart failure (HCC)    Body mass index is 31.02 kg/m.  (Obesity)  Iron deficiency anemia, symptomatic anemia: H&H is stable.  Treat with IV iron sucrose today.  Repeat dose tomorrow.  S/p transfusion with 1 unit of PRBCs on 03/25/2022.  S/p transfusion with 1 units of PRBCs 03/24/2022.  S/p transfusion with 1 unit of PRBCs on 03/22/2022.  No overt GI bleeding.  S/p EGD on 03/24/2022 which showed single gastric polyp.  No plan for colonoscopy per Dr. Virgina Jock, gastroenterologist.  Patient's blood pressure dropped during EGD requiring Neo-Synephrine..  Elevated troponin, CAD with PCI to LAD (CTO dRCA, 80% distal LAD, 50% prox Lcx): No NSTEMI per cardiologist.  S/p left and right heart cath on 03/26/2022.  This revealed significant two-vessel CAD with patent LAD  stents with minimal restenosis.  He has chronically occluded  RCA with left to right collaterals.    (Distal RCA lesion is 100% stenosed, OST 1st diagonal lesion is 90% stenosed, Ost 3rd marginal lesion is 50% stenosed, proximal RCA lesion is 100% stenosed, proximal Cx lesion is 50% stenosed).  EF is 35 to 40%, moderately elevated filling pressures, moderate pulmonary hypertension and normal cardiac output.  Findings also suggestive of significant mitral regurgitation)   Chronic atrial fibrillation with RVR: Overall, heart rate is better.  Continue Toprol-XL.  Follow-up with cardiologist for decision on Eliquis  Hypokalemia: Potassium 3.5.  Continue potassium repletion because of A-fib.  Magnesium was 1.8.  Replete magnesium with IV mag sulfate because of rapid A-fib  Acute on chronic diastolic CHF: Improved.  Continue IV Lasix (Lasix has been increased from 40 to 80 mg twice daily) 2D echo in 03/23/2022 showed EF estimated at 50 to 55%, moderate LVH, moderate to elevated pulmonary artery systolic pressure, mild to moderate MR, mild to moderate TR. Possible TEE per cardiologist when euvolemic.  History of pulmonary embolism: Eliquis is on hold.     Diet Order             Diet Heart Room service appropriate? Yes; Fluid consistency: Thin  Diet effective now                            Consultants: Cardiologist Gastroenterologist  Procedures: EGD on 03/24/2022 Left and right heart cath on 03/26/2022  Medications:    aspirin EC  81 mg Oral Daily   atorvastatin  40 mg Oral Daily   cyanocobalamin  1,000 mcg Intramuscular Q30 days   finasteride  5 mg Oral Daily   furosemide  80 mg Intravenous BID   heparin injection (subcutaneous)  5,000 Units Subcutaneous Q8H   insulin aspart  0-5 Units Subcutaneous QHS   insulin aspart  0-9 Units Subcutaneous TID WC   metoprolol succinate  100 mg Oral Daily   multivitamin with minerals  1 tablet Oral Daily   omega-3 acid ethyl  esters  2 g Oral Daily   pantoprazole  40 mg Oral BID   polyethylene glycol  17 g Oral Daily   sodium chloride flush  3 mL Intravenous Q12H   tamsulosin  0.4 mg Oral Daily   Continuous Infusions:  sodium chloride     iron sucrose        Anti-infectives (From admission, onward)    None              Family Communication/Anticipated D/C date and plan/Code Status   DVT prophylaxis: heparin injection 5,000 Units Start: 03/27/22 1400     Code Status: Full Code  Family Communication: None Disposition Plan: Plan to discharge home in 1 to 2 days   Status is: Inpatient Remains inpatient appropriate because: Symptomatic anemia       Subjective:   Interval events noted.  He complains of fatigue and generalized weakness.  No chest pain.  Objective:    Vitals:   03/27/22 0422 03/27/22 0450 03/27/22 0749 03/27/22 1123  BP: 106/68  126/62 (!) 116/54  Pulse: 72  87 73  Resp: 16  19 (!) 21  Temp: 97.7 F (36.5 C)  97.7 F (36.5 C) 97.7 F (36.5 C)  TempSrc: Oral     SpO2: 95%  95% 95%  Weight:  109.6 kg    Height:       No data found.   Intake/Output Summary (Last 24 hours) at 03/27/2022 1226 Last data filed at 03/27/2022 1123 Gross per 24 hour  Intake 893.96 ml  Output 1100 ml  Net -206.04 ml   Filed Weights   03/26/22 0359 03/26/22 1348 03/27/22 0450  Weight: 106.6 kg 106.6 kg 109.6 kg    Exam:  GEN: NAD SKIN: No rash EYES: EOMI ENT: MMM CV: Irregular rate and rhythm PULM: CTA B ABD: soft, obese, NT, +BS CNS: AAO x 3, non focal EXT: No edema or tenderness        Data Reviewed:   I have personally reviewed following labs and imaging studies:  Labs: Labs show the following:   Basic Metabolic Panel: Recent Labs  Lab 03/23/22 0829 03/24/22 0439 03/25/22 0431 03/26/22 0438 03/27/22 1041  NA 136 137 137 138 137  K 3.8 3.8 3.2* 3.5 3.7  CL 106 108 108 110 105  CO2 '24 25 22 23 23  '$ GLUCOSE 133* 117* 121* 127* 235*  BUN '18 17  17 14 15  '$ CREATININE 0.92 0.90 0.99 0.84 0.89  CALCIUM 8.1* 8.0* 8.0* 8.1* 8.3*  MG  --   --  1.7 1.8  --    GFR Estimated Creatinine Clearance: 87.3 mL/min (by C-G formula based on SCr of 0.89 mg/dL). Liver Function Tests: No results for input(s): "AST", "ALT", "ALKPHOS", "BILITOT", "PROT", "ALBUMIN" in the last 168 hours. No results for input(s): "LIPASE", "AMYLASE" in the last 168 hours. No results for input(s): "AMMONIA" in the last 168 hours. Coagulation profile  Recent Labs  Lab 03/22/22 1430  INR 2.0*    CBC: Recent Labs  Lab 03/23/22 0727 03/24/22 0439 03/25/22 0431 03/25/22 1409 03/26/22 0438 03/27/22 1041  WBC 8.8 8.7 7.5  --  9.0 8.9  HGB 8.3* 7.8* 7.5* 8.5* 8.8* 8.9*  HCT 23.8* 23.1* 22.2* 25.8* 26.1* 27.1*  MCV 86.9 87.8 88.1  --  88.8 89.7  PLT 63* 59* 62*  --  64* 75*   Cardiac Enzymes: No results for input(s): "CKTOTAL", "CKMB", "CKMBINDEX", "TROPONINI" in the last 168 hours. BNP (last 3 results) No results for input(s): "PROBNP" in the last 8760 hours. CBG: Recent Labs  Lab 03/26/22 1403 03/26/22 1544 03/26/22 2025 03/27/22 0750 03/27/22 1128  GLUCAP 183* 163* 202* 136* 274*   D-Dimer: No results for input(s): "DDIMER" in the last 72 hours. Hgb A1c: No results for input(s): "HGBA1C" in the last 72 hours.  Lipid Profile: No results for input(s): "CHOL", "HDL", "LDLCALC", "TRIG", "CHOLHDL", "LDLDIRECT" in the last 72 hours.  Thyroid function studies: No results for input(s): "TSH", "T4TOTAL", "T3FREE", "THYROIDAB" in the last 72 hours.  Invalid input(s): "FREET3" Anemia work up: No results for input(s): "VITAMINB12", "FOLATE", "FERRITIN", "TIBC", "IRON", "RETICCTPCT" in the last 72 hours.  Sepsis Labs: Recent Labs  Lab 03/24/22 0439 03/25/22 0431 03/26/22 0438 03/27/22 1041  WBC 8.7 7.5 9.0 8.9    Microbiology No results found for this or any previous visit (from the past 240 hour(s)).  Procedures and diagnostic  studies:  CARDIAC CATHETERIZATION  Result Date: 03/26/2022   Ost LM lesion is 20% stenosed.   Dist LAD lesion is 80% stenosed.   Prox LAD-1 lesion is 10% stenosed.   Mid LAD lesion is 10% stenosed.   Prox Cx lesion is 50% stenosed.   Dist RCA lesion is 100% stenosed.   Ost 1st Diag lesion is 90% stenosed.   Ost 3rd Mrg to 3rd Mrg lesion is 50% stenosed.   Prox RCA lesion is 100% stenosed.   Non-stenotic Prox LAD-2 lesion was previously treated.   There is moderate left ventricular systolic dysfunction.   LV end diastolic pressure is mildly elevated.   The left ventricular ejection fraction is 35-45% by visual estimate. 1.  Significant two-vessel coronary artery disease with patent LAD stents with minimal restenosis.  Chronically occluded right coronary artery with left-to-right collaterals.  Moderate left circumflex disease.  No significant change overall since most recent cardiac catheterization.  The coronary arteries are moderately calcified and diffusely diseased. 2.  Moderately reduced LV systolic function with an EF of 35 to 40%. 3.  Right heart catheterization showed moderately elevated filling pressures, mild pulmonary hypertension and normal cardiac output.  Pulmonary wedge pressure was 25 mmHg with very prominent V waves suggestive of significant mitral regurgitation. Recommendations: The patient is still volume overloaded.  Continue diuresis. Recommend optimization of systolic heart failure. Mitral regurgitation might be underestimated by echocardiogram.  Consider transesophageal echocardiogram once his heart failure is optimized.              LOS: 5 days   Jovonne Wilton  [facility]   Pager on [email or web address]. If 7PM-7AM, please contact night-coverage at [email or web address]     03/27/2022, 12:26 PM

## 2022-03-27 NOTE — Plan of Care (Signed)
  Problem: Education: Goal: Ability to describe self-care measures that may prevent or decrease complications (Diabetes Survival Skills Education) will improve Outcome: Progressing   

## 2022-03-27 NOTE — Progress Notes (Signed)
Progress Note  Patient Name: Cody Price Date of Encounter: 03/27/2022  [facility] Cardiologist: Ida Rogue, MD   Subjective   [facility] showed patent stents, otherwise unchanged anatomy. LVEF 35-40%. RHC showed volume overload. Cath sites stable. AM labs pending. HE says he is still feeling poorly.   Inpatient Medications    Scheduled Meds:  aspirin EC  81 mg Oral Daily   atorvastatin  40 mg Oral Daily   cyanocobalamin  1,000 mcg Intramuscular Q30 days   finasteride  5 mg Oral Daily   furosemide  40 mg Intravenous BID   insulin aspart  0-5 Units Subcutaneous QHS   insulin aspart  0-9 Units Subcutaneous TID WC   metoprolol succinate  100 mg Oral Daily   multivitamin with minerals  1 tablet Oral Daily   omega-3 acid ethyl esters  2 g Oral Daily   pantoprazole  40 mg Oral BID   polyethylene glycol  17 g Oral Daily   sodium chloride flush  3 mL Intravenous Q12H   tamsulosin  0.4 mg Oral Daily   Continuous Infusions:  sodium chloride     diltiazem (CARDIZEM) infusion Stopped (03/26/22 0943)   iron sucrose     PRN Meds: sodium chloride, oxyCODONE **AND** acetaminophen, acetaminophen, bisacodyl, dextromethorphan-guaiFENesin, diazepam, hydrALAZINE, ipratropium-albuterol, melatonin, methocarbamol, morphine injection, ondansetron (ZOFRAN) IV, mouth rinse, sodium chloride flush   Vital Signs    Vitals:   03/26/22 2348 03/27/22 0422 03/27/22 0450 03/27/22 0749  BP: (!) 100/53 106/68  126/62  Pulse: 72 72  87  Resp: '15 16  19  '$ Temp: 98.1 F (36.7 C) 97.7 F (36.5 C)  97.7 F (36.5 C)  TempSrc: Oral Oral    SpO2: 93% 95%  95%  Weight:   109.6 kg   Height:        Intake/Output Summary (Last 24 hours) at 03/27/2022 1013 Last data filed at 03/27/2022 0753 Gross per 24 hour  Intake 1073.96 ml  Output 600 ml  Net 473.96 ml      03/27/2022    4:50 AM 03/26/2022    1:48 PM 03/26/2022    3:59 AM  Last 3 Weights  Weight (lbs) 241 lb 10 oz 235 lb 0.2 oz 235 lb 0.2 oz   Weight (kg) 109.6 kg 106.6 kg 106.6 kg      Telemetry    Afib HR 70s, PVCs - Personally Reviewed  ECG    No new - Personally Reviewed  Physical Exam   GEN: No acute distress.   Neck: No JVD Cardiac: Irreg Irreg, no murmurs, rubs, or gallops.  Respiratory: Clear to auscultation bilaterally. GI: Soft, nontender, non-distended  MS: No edema; No deformity. Neuro:  Nonfocal  Psych: Normal affect   Labs    High Sensitivity Troponin:   Recent Labs  Lab 03/22/22 1021 03/22/22 1227 03/22/22 1430 03/22/22 1820 03/22/22 2252  TROPONINIHS 69* 72* 66* 62* 72*     Chemistry Recent Labs  Lab 03/24/22 0439 03/25/22 0431 03/26/22 0438  NA 137 137 138  K 3.8 3.2* 3.5  CL 108 108 110  CO2 '25 22 23  '$ GLUCOSE 117* 121* 127*  BUN '17 17 14  '$ CREATININE 0.90 0.99 0.84  CALCIUM 8.0* 8.0* 8.1*  MG  --  1.7 1.8  GFRNONAA >60 >60 >60  ANIONGAP 4* 7 5    Lipids  Recent Labs  Lab 03/22/22 1025  CHOL 62  TRIG 75  HDL 20*  LDLCALC 27  CHOLHDL 3.1  Hematology Recent Labs  Lab 03/24/22 0439 03/25/22 0431 03/25/22 1409 03/26/22 0438  WBC 8.7 7.5  --  9.0  RBC 2.63* 2.52*  --  2.94*  HGB 7.8* 7.5* 8.5* 8.8*  HCT 23.1* 22.2* 25.8* 26.1*  MCV 87.8 88.1  --  88.8  MCH 29.7 29.8  --  29.9  MCHC 33.8 33.8  --  33.7  RDW 18.0* 18.1*  --  18.0*  PLT 59* 62*  --  64*   Thyroid No results for input(s): "TSH", "FREET4" in the last 168 hours.  BNP Recent Labs  Lab 03/22/22 1015  BNP 431.0*    DDimer No results for input(s): "DDIMER" in the last 168 hours.   Radiology    CARDIAC CATHETERIZATION  Result Date: 03/26/2022   Ost LM lesion is 20% stenosed.   Dist LAD lesion is 80% stenosed.   Prox LAD-1 lesion is 10% stenosed.   Mid LAD lesion is 10% stenosed.   Prox Cx lesion is 50% stenosed.   Dist RCA lesion is 100% stenosed.   Ost 1st Diag lesion is 90% stenosed.   Ost 3rd Mrg to 3rd Mrg lesion is 50% stenosed.   Prox RCA lesion is 100% stenosed.   Non-stenotic Prox LAD-2  lesion was previously treated.   There is moderate left ventricular systolic dysfunction.   LV end diastolic pressure is mildly elevated.   The left ventricular ejection fraction is 35-45% by visual estimate. 1.  Significant two-vessel coronary artery disease with patent LAD stents with minimal restenosis.  Chronically occluded right coronary artery with left-to-right collaterals.  Moderate left circumflex disease.  No significant change overall since most recent cardiac catheterization.  The coronary arteries are moderately calcified and diffusely diseased. 2.  Moderately reduced LV systolic function with an EF of 35 to 40%. 3.  Right heart catheterization showed moderately elevated filling pressures, mild pulmonary hypertension and normal cardiac output.  Pulmonary wedge pressure was 25 mmHg with very prominent V waves suggestive of significant mitral regurgitation. Recommendations: The patient is still volume overloaded.  Continue diuresis. Recommend optimization of systolic heart failure. Mitral regurgitation might be underestimated by echocardiogram.  Consider transesophageal echocardiogram once his heart failure is optimized.   Cardiac Studies   Cardiac cath 03/26/22   Ost LM lesion is 20% stenosed.   Dist LAD lesion is 80% stenosed.   Prox LAD-1 lesion is 10% stenosed.   Mid LAD lesion is 10% stenosed.   Prox Cx lesion is 50% stenosed.   Dist RCA lesion is 100% stenosed.   Ost 1st Diag lesion is 90% stenosed.   Ost 3rd Mrg to 3rd Mrg lesion is 50% stenosed.   Prox RCA lesion is 100% stenosed.   Non-stenotic Prox LAD-2 lesion was previously treated.   There is moderate left ventricular systolic dysfunction.   LV end diastolic pressure is mildly elevated.   The left ventricular ejection fraction is 35-45% by visual estimate.   1.  Significant two-vessel coronary artery disease with patent LAD stents with minimal restenosis.  Chronically occluded right coronary artery with left-to-right  collaterals.  Moderate left circumflex disease.  No significant change overall since most recent cardiac catheterization.  The coronary arteries are moderately calcified and diffusely diseased. 2.  Moderately reduced LV systolic function with an EF of 35 to 40%. 3.  Right heart catheterization showed moderately elevated filling pressures, mild pulmonary hypertension and normal cardiac output.  Pulmonary wedge pressure was 25 mmHg with very prominent V waves suggestive of significant  mitral regurgitation.   Recommendations: The patient is still volume overloaded.  Continue diuresis. Recommend optimization of systolic heart failure. Mitral regurgitation might be underestimated by echocardiogram.  Consider transesophageal echocardiogram once his heart failure is optimized.    Echocardiogram completed 03/23/2022 1. Left ventricular ejection fraction, by estimation, is 50 to 55%. The  left ventricle has low normal function. The left ventricle demonstrates  regional wall motion abnormalities (see scoring diagram/findings for  description). There is moderate left  ventricular hypertrophy. Left ventricular diastolic function could not be  evaluated. There is mild hypokinesis of the left ventricular, basal  inferior and inferolateral segments.   2. Right ventricular systolic function is normal. The right ventricular  size is mildly enlarged. There is moderately elevated pulmonary artery  systolic pressure. The estimated right ventricular systolic pressure is  11.9 mmHg.   3. Left atrial size was mildly dilated.   4. Right atrial size was mildly dilated.   5. The mitral valve is abnormal. Mild to moderate mitral valve  regurgitation.   6. Tricuspid valve regurgitation is mild to moderate.   7. The aortic valve is tricuspid. There is mild thickening of the aortic  valve. Aortic valve regurgitation is not visualized. Aortic valve  sclerosis is present, with no evidence of aortic valve stenosis.   8.  Aortic dilatation noted. There is borderline dilatation of the aortic  root and of the ascending aorta, measuring 38 mm.   9. The inferior vena cava is dilated in size with <50% respiratory  variability, suggesting right atrial pressure of 15 mmHg.    LHC 08/2021    Dist LAD lesion is 80% stenosed.   Prox LAD-1 lesion is 10% stenosed.   Prox Cx lesion is 50% stenosed.   Dist RCA lesion is 100% stenosed.   Ost 1st Diag lesion is 90% stenosed.   Prox RCA lesion is 99% stenosed.   Ost 3rd Mrg to 3rd Mrg lesion is 50% stenosed.   Mid LAD lesion is 10% stenosed.   Ost LM lesion is 20% stenosed.   Non-stenotic Prox LAD-2 lesion was previously treated.   The left ventricular systolic function is normal.   LV end diastolic pressure is moderately elevated.   The left ventricular ejection fraction is 50-55% by visual estimate.   1.  Patent LAD stents with no significant restenosis.  Chronically occluded right coronary artery with left-to-right collaterals and moderate left circumflex disease.  No significant change in coronary anatomy since most recent cardiac catheterization. 2.  Low normal LV systolic function with an EF of 50 to 55%.  Moderately elevated left ventricular end-diastolic pressure at 22 mmHg.   Recommendations: Elevated troponin is likely due to supply demand ischemia. Recommend continuing medical therapy. Eliquis can be resumed tonight. The patient is volume overloaded and I elected to resume furosemide 40 mg by mouth twice daily.  Patient Profile     81 y.o. male with a past medical history of coronary artery disease s/p multiple PCI's, chronic diastolic heart failure, permanent atrial fibrillation, DM II, tobacco use, essential hypertension, and hyperlipidemia who is being seen and evaluated for chest pain with possible unstable angina.  Assessment & Plan    NSTEMI - presented with UA with HS trop peak 72, in the setting of acute anemia - IV heparin stopped post-EGD for  persistently low Hgb. S/p 2 units PRBCs - LHC 7/21 showed patent stents with unchanged anatomy, LVEF 35-40%.  - continue ASA, statin and BB therapy   Acute on  chronic diastolic heart failure - IV lasix '40mg'$  BID - Echo showed LVEF 50-55% with subtle basal inferior/inferolateral HK, moderate pulmonary HTN - LHC this admission showed LVEF 35-40%. RHC showed moderately elevated filling pressures, mild pulmonary HTN and significant MR - Net -3.8L - kidney function stable - replete K - continue Toprol  - continue with diuresis. AM labs pending  Permanent Afib - Eliquis on hold for anemia. May need further GI work-up prior to re-initiation of eliquis - Toprol increased for elevated rates yesterday   Anemia - EGD did  not show active bleeding. Due to hypotension during procedure no plan for colonoscopy.  - s/p 2 units PRBCs - Hgb 7.5>8.8 - per GI - AM labs pending  MR - significant MR by cath - may need TEE as outpatient   HTN - BP intermittently soft at times - continue Toprol '50mg'$  daily  For questions or updates, please contact [facility] Please consult [email or web address] for contact info under        Signed, Emonee Winkowski Ninfa Meeker, PA-C  03/27/2022, 10:13 AM

## 2022-03-28 DIAGNOSIS — I5033 Acute on chronic diastolic (congestive) heart failure: Secondary | ICD-10-CM | POA: Diagnosis not present

## 2022-03-28 DIAGNOSIS — I5041 Acute combined systolic (congestive) and diastolic (congestive) heart failure: Secondary | ICD-10-CM | POA: Diagnosis not present

## 2022-03-28 DIAGNOSIS — I482 Chronic atrial fibrillation, unspecified: Secondary | ICD-10-CM | POA: Diagnosis not present

## 2022-03-28 DIAGNOSIS — D649 Anemia, unspecified: Secondary | ICD-10-CM | POA: Diagnosis not present

## 2022-03-28 DIAGNOSIS — R079 Chest pain, unspecified: Secondary | ICD-10-CM | POA: Diagnosis not present

## 2022-03-28 LAB — CBC
HCT: 29.5 % — ABNORMAL LOW (ref 39.0–52.0)
Hemoglobin: 9.6 g/dL — ABNORMAL LOW (ref 13.0–17.0)
MCH: 29.2 pg (ref 26.0–34.0)
MCHC: 32.5 g/dL (ref 30.0–36.0)
MCV: 89.7 fL (ref 80.0–100.0)
Platelets: 77 10*3/uL — ABNORMAL LOW (ref 150–400)
RBC: 3.29 MIL/uL — ABNORMAL LOW (ref 4.22–5.81)
RDW: 18.9 % — ABNORMAL HIGH (ref 11.5–15.5)
WBC: 9.7 10*3/uL (ref 4.0–10.5)
nRBC: 0 % (ref 0.0–0.2)

## 2022-03-28 LAB — BASIC METABOLIC PANEL
Anion gap: 10 (ref 5–15)
BUN: 15 mg/dL (ref 8–23)
CO2: 25 mmol/L (ref 22–32)
Calcium: 8.5 mg/dL — ABNORMAL LOW (ref 8.9–10.3)
Chloride: 102 mmol/L (ref 98–111)
Creatinine, Ser: 1.08 mg/dL (ref 0.61–1.24)
GFR, Estimated: >60 mL/min (ref >60)
Glucose, Bld: 264 mg/dL — ABNORMAL HIGH (ref 70–99)
Potassium: 3.9 mmol/L (ref 3.5–5.1)
Sodium: 137 mmol/L (ref 135–145)

## 2022-03-28 LAB — GLUCOSE, CAPILLARY
Glucose-Capillary: 144 mg/dL — ABNORMAL HIGH (ref 70–99)
Glucose-Capillary: 273 mg/dL — ABNORMAL HIGH (ref 70–99)
Glucose-Capillary: 255 mg/dL — ABNORMAL HIGH (ref 70–99)
Glucose-Capillary: 177 mg/dL — ABNORMAL HIGH (ref 70–99)

## 2022-03-28 MED ORDER — DAPAGLIFLOZIN PROPANEDIOL 10 MG PO TABS
10.0000 mg | ORAL_TABLET | Freq: Every day | ORAL | Status: DC
  Administered 2022-03-28 – 2022-03-31 (×4): 10 mg via ORAL
  Filled 2022-03-28 (×4): qty 1

## 2022-03-28 NOTE — Plan of Care (Signed)
  Problem: Education: Goal: Ability to describe self-care measures that may prevent or decrease complications (Diabetes Survival Skills Education) will improve Outcome: Progressing   

## 2022-03-28 NOTE — Progress Notes (Signed)
Progress Note  Patient Name: Cody Price Date of Encounter: 03/28/2022  [facility] Cardiologist: Ida Rogue, MD   Subjective   Still not feeling well.  Thinks his breathing may be a little better than yesterday.   Inpatient Medications    Scheduled Meds:  aspirin EC  81 mg Oral Daily   atorvastatin  40 mg Oral Daily   cyanocobalamin  1,000 mcg Intramuscular Q30 days   finasteride  5 mg Oral Daily   furosemide  80 mg Intravenous BID   heparin injection (subcutaneous)  5,000 Units Subcutaneous Q8H   insulin aspart  0-5 Units Subcutaneous QHS   insulin aspart  0-9 Units Subcutaneous TID WC   metoprolol succinate  100 mg Oral Daily   multivitamin with minerals  1 tablet Oral Daily   omega-3 acid ethyl esters  2 g Oral Daily   pantoprazole  40 mg Oral BID   polyethylene glycol  17 g Oral Daily   sodium chloride flush  3 mL Intravenous Q12H   tamsulosin  0.4 mg Oral Daily   Continuous Infusions:  sodium chloride     PRN Meds: sodium chloride, oxyCODONE **AND** acetaminophen, acetaminophen, bisacodyl, dextromethorphan-guaiFENesin, diazepam, hydrALAZINE, ipratropium-albuterol, melatonin, methocarbamol, morphine injection, ondansetron (ZOFRAN) IV, mouth rinse, sodium chloride flush   Vital Signs    Vitals:   03/27/22 1746 03/27/22 1927 03/27/22 2351 03/28/22 0504  BP:  137/62 (!) 105/51 (!) 131/59  Pulse: 68 63 76 79  Resp: '16 19 17 20  '$ Temp: 97.7 F (36.5 C) 98.1 F (36.7 C) 98.3 F (36.8 C) 98.3 F (36.8 C)  TempSrc:      SpO2: 100% 97% 97% 95%  Weight:    110.1 kg  Height:        Intake/Output Summary (Last 24 hours) at 03/28/2022 1047 Last data filed at 03/28/2022 0810 Gross per 24 hour  Intake 483 ml  Output 3050 ml  Net -2567 ml      03/28/2022    5:04 AM 03/27/2022    4:50 AM 03/26/2022    1:48 PM  Last 3 Weights  Weight (lbs) 242 lb 11.6 oz 241 lb 10 oz 235 lb 0.2 oz  Weight (kg) 110.1 kg 109.6 kg 106.6 kg      Telemetry    Atrial  fibrillation.  Rate <100 bpm.  PVCs - Personally Reviewed  ECG    Na/ - Personally Reviewed  Physical Exam    VS:  BP (!) 131/59   Pulse 79   Temp 98.3 F (36.8 C)   Resp 20   Ht '6\' 2"'$  (1.88 m)   Wt 110.1 kg   SpO2 95%   BMI 31.16 kg/m  , BMI Body mass index is 31.16 kg/m. GENERAL:  Well appearing HEENT: Pupils equal round and reactive, fundi not visualized, oral mucosa unremarkable NECK:  No jugular venous distention, waveform within normal limits, carotid upstroke brisk and symmetric, no bruits, no thyromegaly LUNGS:  Crackles at the L base HEART:  Irregularly irregular.  PMI not displaced or sustained,S1 and S2 within normal limits, no S3, no S4, no clicks, no rubs, no murmurs ABD:  Flat, positive bowel sounds normal in frequency in pitch, no bruits, no rebound, no guarding, no midline pulsatile mass, no hepatomegaly, no splenomegaly EXT:  2 plus pulses throughout, trace edema, no cyanosis no clubbing SKIN:  No rashes no nodules NEURO:  Cranial nerves II through XII grossly intact, motor grossly intact throughout PSYCH:  Cognitively intact, oriented to  person place and time   Labs    High Sensitivity Troponin:   Recent Labs  Lab 03/22/22 1021 03/22/22 1227 03/22/22 1430 03/22/22 1820 03/22/22 2252  TROPONINIHS 69* 72* 66* 62* 72*     Chemistry Recent Labs  Lab 03/25/22 0431 03/26/22 0438 03/27/22 1041  NA 137 138 137  K 3.2* 3.5 3.7  CL 108 110 105  CO2 '22 23 23  '$ GLUCOSE 121* 127* 235*  BUN '17 14 15  '$ CREATININE 0.99 0.84 0.89  CALCIUM 8.0* 8.1* 8.3*  MG 1.7 1.8  --   GFRNONAA >60 >60 >60  ANIONGAP '7 5 9    '$ Lipids  Recent Labs  Lab 03/22/22 1025  CHOL 62  TRIG 75  HDL 20*  LDLCALC 27  CHOLHDL 3.1    Hematology Recent Labs  Lab 03/25/22 0431 03/25/22 1409 03/26/22 0438 03/27/22 1041  WBC 7.5  --  9.0 8.9  RBC 2.52*  --  2.94* 3.02*  HGB 7.5* 8.5* 8.8* 8.9*  HCT 22.2* 25.8* 26.1* 27.1*  MCV 88.1  --  88.8 89.7  MCH 29.8  --  29.9  29.5  MCHC 33.8  --  33.7 32.8  RDW 18.1*  --  18.0* 18.6*  PLT 62*  --  64* 75*   Thyroid No results for input(s): "TSH", "FREET4" in the last 168 hours.  BNP Recent Labs  Lab 03/22/22 1015  BNP 431.0*    DDimer No results for input(s): "DDIMER" in the last 168 hours.   Radiology    CARDIAC CATHETERIZATION  Result Date: 03/26/2022   Ost LM lesion is 20% stenosed.   Dist LAD lesion is 80% stenosed.   Prox LAD-1 lesion is 10% stenosed.   Mid LAD lesion is 10% stenosed.   Prox Cx lesion is 50% stenosed.   Dist RCA lesion is 100% stenosed.   Ost 1st Diag lesion is 90% stenosed.   Ost 3rd Mrg to 3rd Mrg lesion is 50% stenosed.   Prox RCA lesion is 100% stenosed.   Non-stenotic Prox LAD-2 lesion was previously treated.   There is moderate left ventricular systolic dysfunction.   LV end diastolic pressure is mildly elevated.   The left ventricular ejection fraction is 35-45% by visual estimate. 1.  Significant two-vessel coronary artery disease with patent LAD stents with minimal restenosis.  Chronically occluded right coronary artery with left-to-right collaterals.  Moderate left circumflex disease.  No significant change overall since most recent cardiac catheterization.  The coronary arteries are moderately calcified and diffusely diseased. 2.  Moderately reduced LV systolic function with an EF of 35 to 40%. 3.  Right heart catheterization showed moderately elevated filling pressures, mild pulmonary hypertension and normal cardiac output.  Pulmonary wedge pressure was 25 mmHg with very prominent V waves suggestive of significant mitral regurgitation. Recommendations: The patient is still volume overloaded.  Continue diuresis. Recommend optimization of systolic heart failure. Mitral regurgitation might be underestimated by echocardiogram.  Consider transesophageal echocardiogram once his heart failure is optimized.   Cardiac Studies   RHC/LHC 03/26/22:   Ost LM lesion is 20% stenosed.   Dist LAD  lesion is 80% stenosed.   Prox LAD-1 lesion is 10% stenosed.   Mid LAD lesion is 10% stenosed.   Prox Cx lesion is 50% stenosed.   Dist RCA lesion is 100% stenosed.   Ost 1st Diag lesion is 90% stenosed.   Ost 3rd Mrg to 3rd Mrg lesion is 50% stenosed.   Prox RCA lesion is 100%  stenosed.   Non-stenotic Prox LAD-2 lesion was previously treated.   There is moderate left ventricular systolic dysfunction.   LV end diastolic pressure is mildly elevated.   The left ventricular ejection fraction is 35-45% by visual estimate.   1.  Significant two-vessel coronary artery disease with patent LAD stents with minimal restenosis.  Chronically occluded right coronary artery with left-to-right collaterals.  Moderate left circumflex disease.  No significant change overall since most recent cardiac catheterization.  The coronary arteries are moderately calcified and diffusely diseased. 2.  Moderately reduced LV systolic function with an EF of 35 to 40%. 3.  Right heart catheterization showed moderately elevated filling pressures, mild pulmonary hypertension and normal cardiac output.  Pulmonary wedge pressure was 25 mmHg with very prominent V waves suggestive of significant mitral regurgitation.   Recommendations: The patient is still volume overloaded.  Continue diuresis. Recommend optimization of systolic heart failure. Mitral regurgitation might be underestimated by echocardiogram.  Consider transesophageal echocardiogram once his heart failure is optimized.  Echo 03/23/22:  1. Left ventricular ejection fraction, by estimation, is 50 to 55%. The  left ventricle has low normal function. The left ventricle demonstrates  regional wall motion abnormalities (see scoring diagram/findings for  description). There is moderate left  ventricular hypertrophy. Left ventricular diastolic function could not be  evaluated. There is mild hypokinesis of the left ventricular, basal  inferior and inferolateral segments.    2. Right ventricular systolic function is normal. The right ventricular  size is mildly enlarged. There is moderately elevated pulmonary artery  systolic pressure. The estimated right ventricular systolic pressure is  62.1 mmHg.   3. Left atrial size was mildly dilated.   4. Right atrial size was mildly dilated.   5. The mitral valve is abnormal. Mild to moderate mitral valve  regurgitation.   6. Tricuspid valve regurgitation is mild to moderate.   7. The aortic valve is tricuspid. There is mild thickening of the aortic  valve. Aortic valve regurgitation is not visualized. Aortic valve  sclerosis is present, with no evidence of aortic valve stenosis.   8. Aortic dilatation noted. There is borderline dilatation of the aortic  root and of the ascending aorta, measuring 38 mm.   9. The inferior vena cava is dilated in size with <50% respiratory  variability, suggesting right atrial pressure of 15 mmHg.   Patient Profile     Mr. Larmer is an 33M with obstructive coronary disease (chronically occluded RCA with left-to-right collaterals, moderate left circumflex disease, patent LAD stents, 80% distal LAD, 90% D1, 50% OM 3) diabetes, permanent atrial fibrillation, tobacco abuse, hypertension, and hyperlipidemia admitted with chest pain and mildly elevated troponin.    Assessment & Plan    # Acute systolic and diastolic heart failure: # Mitral regurgitation:  # Hypertension: Echo this admission revealed LVEF 35-40, down from 50-55% previously.  His right heart cath revealed elevated right heart pressures.  Echo was also notable for mild to moderate mitral regurgitation.  At the time of cath it was thought this may be more severe.  Recommend TEE once he is more euvolemic  Lasix was increased to '80mg'$  bid on 7/22 and renal function is stable.  He is net -1.6L in the last 24 hours.  BP stable on metoprolol.  Add ACE-I/ARB/Entresto as BP allows.  Will add [place].  Add BMP and CBC for today.  # CAD:   # Hyperlipidemia: Cath this admission revealed patent LAD stents and otherwise unchanged from prior.  Continue  aspirin, atorvastatin  # Acute on chronic anemia:  He has had acute on chronic anemia this admission with hemoglobin of 7.5 requiring transfusion.  No clear source was identified on EGD.  He developed hypotension and they did not proceed with colonoscopy.  Continue to hold anticoagulation for now.  Agree with further GI work-up before reinitiating Eliquis if possible.      For questions or updates, please contact [place] Please consult [email or web address] for contact info under        Signed, Skeet Latch, MD  03/28/2022, 10:47 AM

## 2022-03-28 NOTE — Progress Notes (Addendum)
Progress Note    Cody Price  MEQ:[id number] DOB: 17-May-1941  DOA: 03/22/2022 PCP: Baxter Hire, MD      Brief Narrative:    Medical records reviewed and are as summarized below:  Cody Price is a 81 y.o. male with medical history significant of CAD with stent, hypertension, hyperlipidemia, diabetes mellitus, atrial fibrillation and PE on Eliquis, BPH, kidney stone, thrombocytopenia, peptic ulcer disease, chronic diastolic CHF, who presented to the hospital with intermittent chest pain, exertional shortness of breath.  He also reported dark stools which she attributed to some juice.      Assessment/Plan:   Principal Problem:   Chest pain Active Problems:   CAD (coronary artery disease)   Chronic diastolic CHF (congestive heart failure) (HCC)   Atrial fibrillation, chronic (HCC)   Hx of pulmonary embolus   Diabetes mellitus without complication (HCC)   Thrombocytopenia (HCC)   BPH with obstruction/lower urinary tract symptoms   Normocytic anemia   Hypertension   HLD (hyperlipidemia)   Anxiety   Acute on chronic heart failure with preserved ejection fraction (HFpEF) (HCC)   Acute combined systolic and diastolic heart failure (HCC)    Body mass index is 31.16 kg/m.  (Obesity)  Iron deficiency anemia, symptomatic anemia: H&H is stable.  S/p treatment with IV iron sucrose on 7/22 on 7/23.  S/p transfusion with 1 unit of PRBCs on 03/25/2022.  S/p transfusion with 1 units of PRBCs 03/24/2022.  S/p transfusion with 1 unit of PRBCs on 03/22/2022.  No overt GI bleeding.  S/p EGD on 03/24/2022 which showed single gastric polyp.  No plan for colonoscopy per Dr. Virgina Jock, gastroenterologist.  Patient's blood pressure dropped during EGD requiring Neo-Synephrine..  Elevated troponin, CAD with PCI to LAD (CTO dRCA, 80% distal LAD, 50% prox Lcx): No NSTEMI per cardiologist.  S/p left and right heart cath on 03/26/2022.  This revealed significant two-vessel CAD with patent LAD  stents with minimal restenosis.  He has chronically occluded  RCA with left to right collaterals.    (Distal RCA lesion is 100% stenosed, OST 1st diagonal lesion is 90% stenosed, Ost 3rd marginal lesion is 50% stenosed, proximal RCA lesion is 100% stenosed, proximal Cx lesion is 50% stenosed).  EF is 35 to 40%, moderately elevated filling pressures, moderate pulmonary hypertension and normal cardiac output.  Findings also suggestive of significant mitral regurgitation)   Chronic atrial fibrillation with RVR: Overall, heart rate is better.  Continue Toprol-XL.  Follow-up with cardiologist for decision on Eliquis  Hypokalemia: Improved.  Monitor BMP  Acute on chronic systolic and diastolic CHF: Continue IV Lasix.  2D echo in 03/23/2022 showed EF estimated at 50 to 55%, moderate LVH, moderate to elevated pulmonary artery systolic pressure, mild to moderate MR, mild to moderate TR. EF by cath was 35 to 40%. Possible TEE per cardiologist when euvolemic.  History of pulmonary embolism: Eliquis is on hold.  Chronic thrombocytopenia: Platelet count is stable  Generalized weakness: PT and OT evaluation     Diet Order             Diet Heart Room service appropriate? Yes; Fluid consistency: Thin  Diet effective now                            Consultants: Cardiologist Gastroenterologist  Procedures: EGD on 03/24/2022 Left and right heart cath on 03/26/2022    Medications:    aspirin EC  81 mg  Oral Daily   atorvastatin  40 mg Oral Daily   cyanocobalamin  1,000 mcg Intramuscular Q30 days   dapagliflozin propanediol  10 mg Oral Daily   finasteride  5 mg Oral Daily   furosemide  80 mg Intravenous BID   heparin injection (subcutaneous)  5,000 Units Subcutaneous Q8H   insulin aspart  0-5 Units Subcutaneous QHS   insulin aspart  0-9 Units Subcutaneous TID WC   metoprolol succinate  100 mg Oral Daily   multivitamin with minerals  1 tablet Oral Daily   omega-3 acid ethyl  esters  2 g Oral Daily   pantoprazole  40 mg Oral BID   polyethylene glycol  17 g Oral Daily   sodium chloride flush  3 mL Intravenous Q12H   tamsulosin  0.4 mg Oral Daily   Continuous Infusions:  sodium chloride        Anti-infectives (From admission, onward)    None              Family Communication/Anticipated D/C date and plan/Code Status   DVT prophylaxis: heparin injection 5,000 Units Start: 03/27/22 1400     Code Status: Full Code  Family Communication: None Disposition Plan: Plan to discharge home in 1 to 2 days   Status is: Inpatient Remains inpatient appropriate because: Symptomatic anemia       Subjective:   He still feels weak and tired.  No chest pain.  Objective:    Vitals:   03/27/22 1746 03/27/22 1927 03/27/22 2351 03/28/22 0504  BP:  137/62 (!) 105/51 (!) 131/59  Pulse: 68 63 76 79  Resp: '16 19 17 20  '$ Temp: 97.7 F (36.5 C) 98.1 F (36.7 C) 98.3 F (36.8 C) 98.3 F (36.8 C)  TempSrc:      SpO2: 100% 97% 97% 95%  Weight:    110.1 kg  Height:       No data found.   Intake/Output Summary (Last 24 hours) at 03/28/2022 1130 Last data filed at 03/28/2022 0810 Gross per 24 hour  Intake 483 ml  Output 2550 ml  Net -2067 ml   Filed Weights   03/26/22 1348 03/27/22 0450 03/28/22 0504  Weight: 106.6 kg 109.6 kg 110.1 kg    Exam:  GEN: NAD SKIN: Warm and dry EYES: No pallor or icterus ENT: MMM CV: Irregular rate and rhythm PULM: CTA B ABD: soft, ND, NT, +BS CNS: AAO x 3, non focal EXT: No edema or tenderness        Data Reviewed:   I have personally reviewed following labs and imaging studies:  Labs: Labs show the following:   Basic Metabolic Panel: Recent Labs  Lab 03/23/22 0829 03/24/22 0439 03/25/22 0431 03/26/22 0438 03/27/22 1041  NA 136 137 137 138 137  K 3.8 3.8 3.2* 3.5 3.7  CL 106 108 108 110 105  CO2 '24 25 22 23 23  '$ GLUCOSE 133* 117* 121* 127* 235*  BUN '18 17 17 14 15  '$ CREATININE 0.92  0.90 0.99 0.84 0.89  CALCIUM 8.1* 8.0* 8.0* 8.1* 8.3*  MG  --   --  1.7 1.8  --    GFR Estimated Creatinine Clearance: 87.5 mL/min (by C-G formula based on SCr of 0.89 mg/dL). Liver Function Tests: No results for input(s): "AST", "ALT", "ALKPHOS", "BILITOT", "PROT", "ALBUMIN" in the last 168 hours. No results for input(s): "LIPASE", "AMYLASE" in the last 168 hours. No results for input(s): "AMMONIA" in the last 168 hours. Coagulation profile Recent Labs  Lab  03/22/22 1430  INR 2.0*    CBC: Recent Labs  Lab 03/23/22 0727 03/24/22 0439 03/25/22 0431 03/25/22 1409 03/26/22 0438 03/27/22 1041  WBC 8.8 8.7 7.5  --  9.0 8.9  HGB 8.3* 7.8* 7.5* 8.5* 8.8* 8.9*  HCT 23.8* 23.1* 22.2* 25.8* 26.1* 27.1*  MCV 86.9 87.8 88.1  --  88.8 89.7  PLT 63* 59* 62*  --  64* 75*   Cardiac Enzymes: No results for input(s): "CKTOTAL", "CKMB", "CKMBINDEX", "TROPONINI" in the last 168 hours. BNP (last 3 results) No results for input(s): "PROBNP" in the last 8760 hours. CBG: Recent Labs  Lab 03/27/22 0750 03/27/22 1128 03/27/22 1747 03/27/22 1926 03/28/22 0808  GLUCAP 136* 274* 221* 256* 144*   D-Dimer: No results for input(s): "DDIMER" in the last 72 hours. Hgb A1c: No results for input(s): "HGBA1C" in the last 72 hours.  Lipid Profile: No results for input(s): "CHOL", "HDL", "LDLCALC", "TRIG", "CHOLHDL", "LDLDIRECT" in the last 72 hours.  Thyroid function studies: No results for input(s): "TSH", "T4TOTAL", "T3FREE", "THYROIDAB" in the last 72 hours.  Invalid input(s): "FREET3" Anemia work up: No results for input(s): "VITAMINB12", "FOLATE", "FERRITIN", "TIBC", "IRON", "RETICCTPCT" in the last 72 hours.  Sepsis Labs: Recent Labs  Lab 03/24/22 0439 03/25/22 0431 03/26/22 0438 03/27/22 1041  WBC 8.7 7.5 9.0 8.9    Microbiology No results found for this or any previous visit (from the past 240 hour(s)).  Procedures and diagnostic studies:  CARDIAC  CATHETERIZATION  Result Date: 03/26/2022   Ost LM lesion is 20% stenosed.   Dist LAD lesion is 80% stenosed.   Prox LAD-1 lesion is 10% stenosed.   Mid LAD lesion is 10% stenosed.   Prox Cx lesion is 50% stenosed.   Dist RCA lesion is 100% stenosed.   Ost 1st Diag lesion is 90% stenosed.   Ost 3rd Mrg to 3rd Mrg lesion is 50% stenosed.   Prox RCA lesion is 100% stenosed.   Non-stenotic Prox LAD-2 lesion was previously treated.   There is moderate left ventricular systolic dysfunction.   LV end diastolic pressure is mildly elevated.   The left ventricular ejection fraction is 35-45% by visual estimate. 1.  Significant two-vessel coronary artery disease with patent LAD stents with minimal restenosis.  Chronically occluded right coronary artery with left-to-right collaterals.  Moderate left circumflex disease.  No significant change overall since most recent cardiac catheterization.  The coronary arteries are moderately calcified and diffusely diseased. 2.  Moderately reduced LV systolic function with an EF of 35 to 40%. 3.  Right heart catheterization showed moderately elevated filling pressures, mild pulmonary hypertension and normal cardiac output.  Pulmonary wedge pressure was 25 mmHg with very prominent V waves suggestive of significant mitral regurgitation. Recommendations: The patient is still volume overloaded.  Continue diuresis. Recommend optimization of systolic heart failure. Mitral regurgitation might be underestimated by echocardiogram.  Consider transesophageal echocardiogram once his heart failure is optimized.              LOS: 6 days   Faigy Stretch  [facility]   Pager on [email or web address]. If 7PM-7AM, please contact night-coverage at [email or web address]     03/28/2022, 11:30 AM

## 2022-03-29 ENCOUNTER — Encounter: Payer: Self-pay | Admitting: Cardiovascular Disease

## 2022-03-29 DIAGNOSIS — I5041 Acute combined systolic (congestive) and diastolic (congestive) heart failure: Secondary | ICD-10-CM | POA: Diagnosis not present

## 2022-03-29 DIAGNOSIS — I5033 Acute on chronic diastolic (congestive) heart failure: Secondary | ICD-10-CM | POA: Diagnosis not present

## 2022-03-29 DIAGNOSIS — I482 Chronic atrial fibrillation, unspecified: Secondary | ICD-10-CM | POA: Diagnosis not present

## 2022-03-29 LAB — BASIC METABOLIC PANEL WITH GFR
Anion gap: 8 (ref 5–15)
BUN: 14 mg/dL (ref 8–23)
CO2: 28 mmol/L (ref 22–32)
Calcium: 8.8 mg/dL — ABNORMAL LOW (ref 8.9–10.3)
Chloride: 105 mmol/L (ref 98–111)
Creatinine, Ser: 0.99 mg/dL (ref 0.61–1.24)
GFR, Estimated: >60 mL/min
Glucose, Bld: 136 mg/dL — ABNORMAL HIGH (ref 70–99)
Potassium: 3.4 mmol/L — ABNORMAL LOW (ref 3.5–5.1)
Sodium: 141 mmol/L (ref 135–145)

## 2022-03-29 LAB — GLUCOSE, CAPILLARY
Glucose-Capillary: 169 mg/dL — ABNORMAL HIGH (ref 70–99)
Glucose-Capillary: 234 mg/dL — ABNORMAL HIGH (ref 70–99)
Glucose-Capillary: 286 mg/dL — ABNORMAL HIGH (ref 70–99)
Glucose-Capillary: 234 mg/dL — ABNORMAL HIGH (ref 70–99)

## 2022-03-29 LAB — MAGNESIUM: Magnesium: 2 mg/dL (ref 1.7–2.4)

## 2022-03-29 MED ORDER — SODIUM CHLORIDE 0.9 % IV SOLN: INTRAVENOUS | Status: DC

## 2022-03-29 MED ORDER — SUCRALFATE 1 G PO TABS
1.0000 g | ORAL_TABLET | Freq: Four times a day (QID) | ORAL | Status: DC
  Administered 2022-03-29 – 2022-03-31 (×10): 1 g via ORAL
  Filled 2022-03-29 (×10): qty 1

## 2022-03-29 MED ORDER — APIXABAN 5 MG PO TABS
5.0000 mg | ORAL_TABLET | Freq: Two times a day (BID) | ORAL | Status: DC
  Administered 2022-03-29 – 2022-03-31 (×5): 5 mg via ORAL
  Filled 2022-03-29 (×5): qty 1

## 2022-03-29 MED ORDER — POTASSIUM CHLORIDE CRYS ER 20 MEQ PO TBCR
40.0000 meq | EXTENDED_RELEASE_TABLET | Freq: Once | ORAL | Status: AC
  Administered 2022-03-29: 40 meq via ORAL
  Filled 2022-03-29: qty 2

## 2022-03-29 NOTE — Evaluation (Signed)
Physical Therapy Evaluation Patient Details Name: Cody Price MRN: [id number] DOB: 1940/12/15 Today's Date: 03/29/2022  History of Present Illness  Cody Price is an 39yoM PMH CAD s/p stent, HTN, HLD, DM, Afib, PE on Eliquis, BPH, kidney stone, thrombocytopenia, PUD, dCHF, who presented to the hospital 03/22/22 with intermittent chest pain, exertional shortness of breath. No NSTEMI per cardiologist.  S/p left and right heart cath on 03/26/2022.  Clinical Impression  Pt admitted with above diagnosis. Pt currently with functional limitations due to the deficits listed below (see "PT Problem List"). Upon entry, pt in bed, awake and agreeable to participate. The pt is alert, pleasant, interactive, and able to provide some basic info regarding prior level of function, both in tolerance and independence, however he has limited tolerance to extensive questioning and stops short of describing what symptoms he has with his regular "sick spells" before abruptly asking author to "get the hell out if you're gonna keep asking me all these questions." Pt moves well in bed from supine to sitting EOB. Pt able to STS unsupported and acquire balance immediately, but he is not willing to attempt marching in place without UE support, and even with RW does not feel strong enough to attempt AMB in room. Pt is avoidant of author's recommendations for DME for safe mobility as he sees use of DME as a remark on his character, although he does acknowledge that he will have to take additional safety measures upon return to home. Patient's performance this date reveals decreased ability, independence, and tolerance in performing all basic mobility required for performance of activities of daily living. Pt requires additional DME, close physical assistance, and cues for safe participate in mobility. Pt will benefit from skilled PT intervention to increase independence and safety with basic mobility in preparation for discharge to the  venue listed below.     No data found.        Recommendations for follow up therapy are one component of a multi-disciplinary discharge planning process, led by the attending physician.  Recommendations may be updated based on patient status, additional functional criteria and insurance authorization.  Follow Up Recommendations Home health PT      Assistance Recommended at Discharge Set up Supervision/Assistance  Patient can return home with the following  A little help with walking and/or transfers;Assist for transportation    Equipment Recommendations None recommended by PT (pt has avaialbel DME as needed)  Recommendations for Other Services       Functional Status Assessment Patient has had a recent decline in their functional status and demonstrates the ability to make significant improvements in function in a reasonable and predictable amount of time.     Precautions / Restrictions Precautions Precautions: Fall Restrictions Weight Bearing Restrictions: No      Mobility  Bed Mobility Overal bed mobility: Modified Independent             General bed mobility comments: appears to have good strength in general    Transfers Overall transfer level: Needs assistance Equipment used: None Transfers: Sit to/from Stand, Bed to chair/wheelchair/BSC Sit to Stand: Modified independent (Device/Increase time) Stand pivot transfers: Supervision (refuses RW)         General transfer comment: able to demo static balance unsupported x 30sec    Ambulation/Gait Ambulation/Gait assistance:  (deferred at this time)                Soil scientist  Mobility    Modified Rankin (Stroke Patients Only)       Balance                                             Pertinent Vitals/Pain Pain Assessment Pain Assessment: No/denies pain    Home Living Family/patient expects to be discharged to:: Private residence Living  Arrangements: Spouse/significant other Available Help at Discharge: Family (DTR lives nextdoor) Type of Home: House Home Access: Level entry       Home Layout: One level Home Equipment: Conservation officer, nature (2 wheels) Additional Comments: does not use DME at baseline, but has chronic post surgical neck/back pain hence uses electric scooter at grocery store    Prior Function Prior Level of Function : Independent/Modified Independent;Driving                     Hand Dominance        Extremity/Trunk Assessment   Upper Extremity Assessment Upper Extremity Assessment: Generalized weakness;Overall [facility] for tasks assessed    Lower Extremity Assessment Lower Extremity Assessment: Generalized weakness;Overall WFL for tasks assessed       Communication      Cognition Arousal/Alertness: Awake/alert Behavior During Therapy: WFL for tasks assessed/performed Overall Cognitive Status: Within Functional Limits for tasks assessed                                          General Comments      Exercises Other Exercises Other Exercises: marching in place x 40   Assessment/Plan    PT Assessment Patient needs continued PT services  PT Problem List Decreased strength;Decreased range of motion;Decreased activity tolerance;Decreased balance;Decreased mobility;Decreased knowledge of use of DME       PT Treatment Interventions DME instruction;Balance training;Gait training;Stair training;Functional mobility training;Therapeutic activities;Therapeutic exercise;Patient/family education    PT Goals (Current goals can be found in the Care Plan section)  Acute Rehab PT Goals Patient Stated Goal: regain strength PT Goal Formulation: With patient Time For Goal Achievement: 04/12/22 Potential to Achieve Goals: Fair    Frequency Min 2X/week     Co-evaluation               AM-PAC PT "6 Clicks" Mobility  Outcome Measure Help needed turning from your back to your  side while in a flat bed without using bedrails?: None Help needed moving from lying on your back to sitting on the side of a flat bed without using bedrails?: None Help needed moving to and from a bed to a chair (including a wheelchair)?: None Help needed standing up from a chair using your arms (e.g., wheelchair or bedside chair)?: None Help needed to walk in hospital room?: A Little Help needed climbing 3-5 steps with a railing? : A Little 6 Click Score: 22    End of Session   Activity Tolerance: Patient tolerated treatment well;Patient limited by fatigue Patient left: in chair;with call bell/phone within reach;with chair alarm set Nurse Communication: Mobility status PT Visit Diagnosis: Difficulty in walking, not elsewhere classified (R26.2);Unsteadiness on feet (R26.81);Other abnormalities of gait and mobility (R26.89);Muscle weakness (generalized) (M62.81)    Time: 3382-5053 PT Time Calculation (min) (ACUTE ONLY): 23 min   Charges:   PT Evaluation $PT Eval Moderate Complexity: 1 Mod  12:23 PM, 03/29/22 Etta Grandchild, PT, DPT Physical Therapist - [facility]  [phone] ([place])    [place] C 03/29/2022, 12:18 PM

## 2022-03-29 NOTE — Inpatient Diabetes Management (Signed)
Inpatient Diabetes Program Recommendations  AACE/ADA: New Consensus Statement on Inpatient Glycemic Control (2015)  Target Ranges:  Prepandial:   less than 140 mg/dL      Peak postprandial:   less than 180 mg/dL (1-2 hours)      Critically ill patients:  140 - 180 mg/dL    Latest Reference Range & Units 03/28/22 08:08 03/28/22 11:47 03/28/22 17:06 03/28/22 21:22  Glucose-Capillary 70 - 99 mg/dL 144 (H)  1 unit Novolog  273 (H)  3 units Novolog  255 (H)  5 units Novolog  177 (H)  (H): Data is abnormally high  Latest Reference Range & Units 03/29/22 07:46 03/29/22 12:02  Glucose-Capillary 70 - 99 mg/dL 169 (H)  2 units Novolog  234 (H)  3 units Novolog   (H): Data is abnormally high    Home DM Meds: Amaryl 4 mg BID  Current Orders: Novolog Sensitive Correction Scale/ SSI (0-9 units) TID AC + HS     Farxiga 10 mg daily     MD- Note afternoon CBGs are elevated.  Home Amaryl on hold.  After pt resumes PO diet tomorrow after TEE, please consider stating low dose Novolog Meal Coverage:   Novolog 3 units TID with meals HOLD if pt eats <50% meals    --Will follow patient during hospitalization--  Wyn Quaker RN, MSN, [place] Diabetes Coordinator Inpatient Glycemic Control Team Team Pager: [phone] (8a-5p)

## 2022-03-29 NOTE — Progress Notes (Signed)
Progress Note    [name]  WNU:[id number] DOB: July 10, 1941  DOA: 03/22/2022 PCP: Baxter Hire, MD      Brief Narrative:    Medical records reviewed and are as summarized below:  Cody Price is a 81 y.o. male with medical history significant of CAD with stent, hypertension, hyperlipidemia, diabetes mellitus, atrial fibrillation and PE on Eliquis, BPH, kidney stone, thrombocytopenia, peptic ulcer disease, chronic diastolic CHF, who presented to the hospital with intermittent chest pain, exertional shortness of breath.  He also reported dark stools which she attributed to some juice.      Assessment/Plan:   Principal Problem:   Chest pain Active Problems:   CAD (coronary artery disease)   Chronic diastolic CHF (congestive heart failure) (HCC)   Atrial fibrillation, chronic (HCC)   Hx of pulmonary embolus   Diabetes mellitus without complication (HCC)   Thrombocytopenia (HCC)   BPH with obstruction/lower urinary tract symptoms   Normocytic anemia   Hypertension   HLD (hyperlipidemia)   Anxiety   Acute on chronic heart failure with preserved ejection fraction (HFpEF) (HCC)   Acute combined systolic and diastolic heart failure (HCC)    Body mass index is 30.23 kg/m.  (Obesity)  Iron deficiency anemia, symptomatic anemia: Hemoglobin has improved to 9.6.  S/p treatment with IV iron sucrose on 7/22 on 7/23.  S/p transfusion with 1 unit of PRBCs on 03/25/2022.  S/p transfusion with 1 units of PRBCs 03/24/2022.  S/p transfusion with 1 unit of PRBCs on 03/22/2022.  No overt GI bleeding.  S/p EGD on 03/24/2022 which showed single gastric polyp.  No plan for colonoscopy per Dr. Virgina Jock, gastroenterologist.  Patient's blood pressure dropped during EGD requiring Neo-Synephrine.. Reengaged Dr. Virgina Jock, gastroenterologist, today.  He still thinks that colonoscopy can be avoided at this time since patient's hemoglobin has remained stable  Elevated troponin, CAD with PCI to LAD  (CTO dRCA, 80% distal LAD, 50% prox Lcx): No NSTEMI per cardiologist.  S/p left and right heart cath on 03/26/2022.  This revealed significant two-vessel CAD with patent LAD stents with minimal restenosis.  He has chronically occluded  RCA with left to right collaterals.    (Distal RCA lesion is 100% stenosed, OST 1st diagonal lesion is 90% stenosed, Ost 3rd marginal lesion is 50% stenosed, proximal RCA lesion is 100% stenosed, proximal Cx lesion is 50% stenosed).  EF is 35 to 40%, moderately elevated filling pressures, moderate pulmonary hypertension and normal cardiac output.  Findings also suggestive of significant mitral regurgitation)   Chronic atrial fibrillation with RVR: Overall, heart rate is better.  Continue Toprol-XL.  Cardiologist has restarted Eliquis for stroke prophylaxis.  Case discussed with Dr. Fletcher Anon, cardiologist.  Hypokalemia: Replete potassium and monitor levels.   Acute on chronic systolic and diastolic CHF: Continue IV Lasix per cardiologist.  2D echo in 03/23/2022 showed EF estimated at 50 to 55%, moderate LVH, moderate to elevated pulmonary artery systolic pressure, mild to moderate MR, mild to moderate TR. EF by cath was 35 to 40%. Possible TEE per cardiologist when euvolemic.  History of pulmonary embolism: On Eliquis  Chronic thrombocytopenia: Platelet count is stable  Generalized weakness: Home health PT and OT recommended     Diet Order             Diet NPO time specified  Diet effective midnight           Diet Heart Room service appropriate? Yes; Fluid consistency: Thin  Diet effective now  Consultants: Cardiologist Gastroenterologist  Procedures: EGD on 03/24/2022 Left and right heart cath on 03/26/2022    Medications:    apixaban  5 mg Oral BID   aspirin EC  81 mg Oral Daily   atorvastatin  40 mg Oral Daily   cyanocobalamin  1,000 mcg Intramuscular Q30 days   dapagliflozin propanediol  10 mg Oral Daily    finasteride  5 mg Oral Daily   furosemide  80 mg Intravenous BID   insulin aspart  0-5 Units Subcutaneous QHS   insulin aspart  0-9 Units Subcutaneous TID WC   metoprolol succinate  100 mg Oral Daily   multivitamin with minerals  1 tablet Oral Daily   omega-3 acid ethyl esters  2 g Oral Daily   pantoprazole  40 mg Oral BID   polyethylene glycol  17 g Oral Daily   sodium chloride flush  3 mL Intravenous Q12H   sucralfate  1 g Oral QID   tamsulosin  0.4 mg Oral Daily   Continuous Infusions:  sodium chloride        Anti-infectives (From admission, onward)    None              Family Communication/Anticipated D/C date and plan/Code Status   DVT prophylaxis:  apixaban (ELIQUIS) tablet 5 mg     Code Status: Full Code  Family Communication: None Disposition Plan: Plan to discharge home in 1 to 2 days   Status is: Inpatient Remains inpatient appropriate because: Symptomatic anemia       Subjective:   He complains of fatigue and general weakness. He's sitting up in the chair  Objective:    Vitals:   03/29/22 0508 03/29/22 0600 03/29/22 0744 03/29/22 1200  BP: (!) 108/57  112/62 (!) 117/53  Pulse: 70  74 (!) 105  Resp: '20 17 20 18  '$ Temp: 97.8 F (36.6 C)  97.7 F (36.5 C) 97.7 F (36.5 C)  TempSrc:      SpO2: 91%  91% 98%  Weight: 106.8 kg     Height:       No data found.   Intake/Output Summary (Last 24 hours) at 03/29/2022 1418 Last data filed at 03/29/2022 1100 Gross per 24 hour  Intake 960 ml  Output 3150 ml  Net -2190 ml   Filed Weights   03/27/22 0450 03/28/22 0504 03/29/22 0508  Weight: 109.6 kg 110.1 kg 106.8 kg    Exam:  GEN: NAD SKIN: Warm and dry EYES: EOMI ENT: MMM CV: Irregular rate and rhythm PULM: Bibasilar rales ABD: soft, ND, NT, +BS CNS: AAO x 3, non focal EXT: No edema or tenderness          Data Reviewed:   I have personally reviewed following labs and imaging studies:  Labs: Labs show the  following:   Basic Metabolic Panel: Recent Labs  Lab 03/25/22 0431 03/26/22 0438 03/27/22 1041 03/28/22 1211 03/29/22 0643  NA 137 138 137 137 141  K 3.2* 3.5 3.7 3.9 3.4*  CL 108 110 105 102 105  CO2 '22 23 23 25 28  '$ GLUCOSE 121* 127* 235* 264* 136*  BUN '17 14 15 15 14  '$ CREATININE 0.99 0.84 0.89 1.08 0.99  CALCIUM 8.0* 8.1* 8.3* 8.5* 8.8*  MG 1.7 1.8  --   --  2.0   GFR Estimated Creatinine Clearance: 77.4 mL/min (by C-G formula based on SCr of 0.99 mg/dL). Liver Function Tests: No results for input(s): "AST", "ALT", "ALKPHOS", "BILITOT", "PROT", "ALBUMIN" in the  last 168 hours. No results for input(s): "LIPASE", "AMYLASE" in the last 168 hours. No results for input(s): "AMMONIA" in the last 168 hours. Coagulation profile Recent Labs  Lab 03/22/22 1430  INR 2.0*    CBC: Recent Labs  Lab 03/24/22 0439 03/25/22 0431 03/25/22 1409 03/26/22 0438 03/27/22 1041 03/28/22 1211  WBC 8.7 7.5  --  9.0 8.9 9.7  HGB 7.8* 7.5* 8.5* 8.8* 8.9* 9.6*  HCT 23.1* 22.2* 25.8* 26.1* 27.1* 29.5*  MCV 87.8 88.1  --  88.8 89.7 89.7  PLT 59* 62*  --  64* 75* 77*   Cardiac Enzymes: No results for input(s): "CKTOTAL", "CKMB", "CKMBINDEX", "TROPONINI" in the last 168 hours. BNP (last 3 results) No results for input(s): "PROBNP" in the last 8760 hours. CBG: Recent Labs  Lab 03/28/22 1147 03/28/22 1706 03/28/22 2122 03/29/22 0746 03/29/22 1202  GLUCAP 273* 255* 177* 169* 234*   D-Dimer: No results for input(s): "DDIMER" in the last 72 hours. Hgb A1c: No results for input(s): "HGBA1C" in the last 72 hours.  Lipid Profile: No results for input(s): "CHOL", "HDL", "LDLCALC", "TRIG", "CHOLHDL", "LDLDIRECT" in the last 72 hours.  Thyroid function studies: No results for input(s): "TSH", "T4TOTAL", "T3FREE", "THYROIDAB" in the last 72 hours.  Invalid input(s): "FREET3" Anemia work up: No results for input(s): "VITAMINB12", "FOLATE", "FERRITIN", "TIBC", "IRON", "RETICCTPCT" in the  last 72 hours.  Sepsis Labs: Recent Labs  Lab 03/25/22 0431 03/26/22 0438 03/27/22 1041 03/28/22 1211  WBC 7.5 9.0 8.9 9.7    Microbiology No results found for this or any previous visit (from the past 240 hour(s)).  Procedures and diagnostic studies:  No results found.             LOS: 7 days   Naja Apperson  [facility]   Pager on [email or web address]. If 7PM-7AM, please contact night-coverage at [email or web address]     03/29/2022, 2:18 PM

## 2022-03-29 NOTE — TOC Progression Note (Signed)
Transition of Care [facility]) - Progression Note    Patient Details  Name: Cody Price MRN: [id number] Date of Birth: 03-06-1941  Transition of [facility] CM/SW Contact  Beverly Sessions, RN Phone Number: 03/29/2022, 3:01 PM  Clinical Narrative:    Therapy recommending home health Patient agreeable to home health services.  States he does not have a preference of home health agency.  Referral made and accepted by Malachy Mood with [facility] for RN PT and OT   Expected Discharge Plan: Home/Self Care Barriers to Discharge: Continued Medical Work up  Expected Discharge Plan and Services Expected Discharge Plan: Home/Self Care     Post Acute Care Choice: NA Living arrangements for the past 2 months: Single Family Home                                       Social Determinants of Health (SDOH) Interventions    Readmission Risk Interventions    03/23/2022   11:07 AM 08/24/2021    9:55 AM 07/09/2020    9:40 AM  Readmission Risk Prevention Plan  Transportation Screening Complete Complete Complete  PCP or Specialist Appt within 3-5 Days Complete Complete Complete  HRI or [place]  Complete Complete  Social Work Consult for [place] Planning/Counseling Complete Complete Complete  Palliative Care Screening Not Applicable Not Applicable Complete  Medication Review Press photographer) Complete Complete Complete

## 2022-03-29 NOTE — Progress Notes (Signed)
   [facility] has been requested to perform a transesophageal echocardiogram on Cody Price for mitral regurgitation.  After careful review of history and examination, the risks and benefits of transesophageal echocardiogram have been explained including risks of esophageal damage, perforation (1:10,000 risk), bleeding, pharyngeal hematoma as well as other potential complications associated with conscious sedation including aspiration, arrhythmia, respiratory failure and death. Alternatives to treatment were discussed, questions were answered. Patient is willing to proceed. He is tentatively scheduled for 7/25 at 12:30 PM.  Murray Hodgkins, NP  03/29/2022 1:39 PM

## 2022-03-29 NOTE — Care Management Important Message (Signed)
Important Message  Patient Details  Name: Cody Price MRN: [id number] Date of Birth: 08-Jul-1941   Medicare Important Message Given:  Yes     Dannette Barbara 03/29/2022, 11:43 AM

## 2022-03-29 NOTE — Evaluation (Signed)
Occupational Therapy Evaluation Patient Details Name: Cody Price MRN: [id number] DOB: [birth year] Today's Date: 03/29/2022   History of Present Illness Cody Price is an 24yoM PMH CAD s/p stent, HTN, HLD, DM, Afib, PE on Eliquis, BPH, kidney stone, thrombocytopenia, PUD, dCHF, who presented to the hospital 03/22/22 with intermittent chest pain, exertional shortness of breath. No NSTEMI per cardiologist.  S/p left and right heart cath on 03/26/2022.   Clinical Impression   Pt was seen for OT evaluation this date. Prior to hospital admission, pt reports he was indep with ADL and IADL including mowing lawns with riding mower and other yard work activities. Pt up in the recliner after PT session and reports mild SOB and 9/10 chest/stomach/back pain. RN notified. Pt educated in role of OT, home/routines modifications, falls prevention, and additional energy conservation strategies to maximize and maintain independence and safety with ADL/IADL. Currently pt demonstrates impairments as described below (See OT problem list) which functionally limit his ability to perform ADL and IADL tasks. Anticipate pt requires PRN Assist for LB ADL at this time. Pt would benefit from skilled OT services to address noted impairments and functional limitations (see below for any additional details) in order to maximize safety and independence while minimizing falls risk and caregiver burden. Upon hospital discharge, recommend HHOT to maximize pt safety and return to functional independence during meaningful occupations of daily life.      Recommendations for follow up therapy are one component of a multi-disciplinary discharge planning process, led by the attending physician.  Recommendations may be updated based on patient status, additional functional criteria and insurance authorization.   Follow Up Recommendations  Home health OT    Assistance Recommended at Discharge PRN  Patient can return home with the following A  little help with bathing/dressing/bathroom;Assistance with cooking/housework;Assist for transportation    Functional Status Assessment  Patient has had a recent decline in their functional status and demonstrates the ability to make significant improvements in function in a reasonable and predictable amount of time.  Equipment Recommendations  Tub/shower seat    Recommendations for Other Services       Precautions / Restrictions Precautions Precautions: Fall Restrictions Weight Bearing Restrictions: No      Mobility Bed Mobility               General bed mobility comments: NT, up in recliner    Transfers                   General transfer comment: declines, states he's able to complete on his own (per PT evaluation he was mod indep)      Balance Overall balance assessment: History of Falls, Needs assistance Sitting-balance support: No upper extremity supported, Feet supported Sitting balance-Leahy Scale: Good                                     ADL either performed or assessed with clinical judgement   ADL                                         General ADL Comments: Pt endorses some difficulty with standing at a sink to shave at baseline. Pt insists he's able to complete all basic ADL without assist.     Vision  Perception     Praxis      Pertinent Vitals/Pain Pain Assessment Pain Assessment: 0-10 Pain Score: 9  Pain Location: stomach, back, chest Pain Descriptors / Indicators: Aching Pain Intervention(s): Monitored during session, Patient requesting pain meds-RN notified     Hand Dominance     Extremity/Trunk Assessment Upper Extremity Assessment Upper Extremity Assessment: Overall WFL for tasks assessed;Generalized weakness   Lower Extremity Assessment Lower Extremity Assessment: Overall WFL for tasks assessed;Generalized weakness       Communication     Cognition Arousal/Alertness:  Awake/alert Behavior During Therapy: WFL for tasks assessed/performed Overall Cognitive Status: Within Functional Limits for tasks assessed                                       General Comments       Exercises Other Exercises Other Exercises: Pt educated in role of OT, home/routines modifications, falls prevention, and additional energy conservation strategies to maximize and maintain independence and safety with ADL/IADL.   Shoulder Instructions      Home Living Family/patient expects to be discharged to:: Private residence Living Arrangements: Spouse/significant other Available Help at Discharge: Family (DTR lives nextdoor) Type of Home: House Home Access: Level entry     Home Layout: One level     Bathroom Shower/Tub: Walk-in shower (International aid/development worker)         Home Equipment: Conservation officer, nature (2 wheels)   Additional Comments: does not use DME at baseline, but has chronic post surgical neck/back pain hence uses electric scooter at grocery store      Prior Functioning/Environment Prior Level of Function : Independent/Modified Independent;Driving;History of Falls (last six months)             Mobility Comments: pt reports PRN SPC use at home ADLs Comments: indep with ADL, yardwork at baseline, per pt report. he does endorse falls but states they were of no consequence        OT Problem List: Pain;Decreased activity tolerance;Decreased knowledge of use of DME or AE      OT Treatment/Interventions: Self-care/ADL training;Therapeutic exercise;Therapeutic activities;Energy conservation;DME and/or AE instruction;Patient/family education    OT Goals(Current goals can be found in the care plan section) Acute Rehab OT Goals Patient Stated Goal: go home and be independent OT Goal Formulation: With patient Time For Goal Achievement: 04/12/22 Potential to Achieve Goals: Good ADL Goals Pt Will Perform Lower Body Dressing: with modified independence;sit  to/from stand Pt Will Transfer to Toilet: with modified independence Pt Will Perform Toileting - Clothing Manipulation and hygiene: with modified independence Additional ADL Goal #1: Pt will identify at least 2 learned ECS to incorporate into ADL/IADL routines to maximize safety/indep.  OT Frequency: Min 2X/week    Co-evaluation              AM-PAC OT "6 Clicks" Daily Activity     Outcome Measure Help from another person eating meals?: None Help from another person taking care of personal grooming?: None Help from another person toileting, which includes using toliet, bedpan, or urinal?: A Little Help from another person bathing (including washing, rinsing, drying)?: A Little Help from another person to put on and taking off regular upper body clothing?: None Help from another person to put on and taking off regular lower body clothing?: A Little 6 Click Score: 21   End of Session    Activity Tolerance: Patient limited by pain  Patient left: in chair;with call bell/phone within reach  OT Visit Diagnosis: Other abnormalities of gait and mobility (R26.89);Pain Pain - Right/Left:  (chest, back, stomach)                Time: 9476-5465 OT Time Calculation (min): 16 min Charges:  OT General Charges $OT Visit: 1 Visit OT Evaluation $OT Eval Low Complexity: 1 Low $OT Eval Moderate Complexity: 1 Mod OT Treatments $Self Care/Home Management : 8-22 mins  Ardeth Perfect., MPH, MS, OTR/L ascom [phone] 03/29/22, 1:24 PM

## 2022-03-29 NOTE — Progress Notes (Signed)
Cardiology Progress Note   Patient Name: Cody Price Date of Encounter: 03/29/2022  Primary Cardiologist: Ida Rogue, MD  Subjective   Not feeling great today. Having epigastric pain.  Patient continues to have shortness of breath but improving along with decreased edema.    Inpatient Medications    Scheduled Meds:  aspirin EC  81 mg Oral Daily   atorvastatin  40 mg Oral Daily   cyanocobalamin  1,000 mcg Intramuscular Q30 days   dapagliflozin propanediol  10 mg Oral Daily   finasteride  5 mg Oral Daily   furosemide  80 mg Intravenous BID   heparin injection (subcutaneous)  5,000 Units Subcutaneous Q8H   insulin aspart  0-5 Units Subcutaneous QHS   insulin aspart  0-9 Units Subcutaneous TID WC   metoprolol succinate  100 mg Oral Daily   multivitamin with minerals  1 tablet Oral Daily   omega-3 acid ethyl esters  2 g Oral Daily   pantoprazole  40 mg Oral BID   polyethylene glycol  17 g Oral Daily   potassium chloride  40 mEq Oral Once   sodium chloride flush  3 mL Intravenous Q12H   tamsulosin  0.4 mg Oral Daily   Continuous Infusions:  sodium chloride     PRN Meds: sodium chloride, oxyCODONE **AND** acetaminophen, acetaminophen, bisacodyl, dextromethorphan-guaiFENesin, diazepam, hydrALAZINE, ipratropium-albuterol, melatonin, methocarbamol, morphine injection, ondansetron (ZOFRAN) IV, mouth rinse, sodium chloride flush   Vital Signs    Vitals:   03/29/22 0012 03/29/22 0508 03/29/22 0600 03/29/22 0744  BP: (!) 103/50 (!) 108/57  112/62  Pulse: (!) 55 70  74  Resp: '16 20 17 20  '$ Temp: 97.9 F (36.6 C) 97.8 F (36.6 C)  97.7 F (36.5 C)  TempSrc:      SpO2: 93% 91%  91%  Weight:  106.8 kg    Height:        Intake/Output Summary (Last 24 hours) at 03/29/2022 0809 Last data filed at 03/28/2022 2200 Gross per 24 hour  Intake 820 ml  Output 2975 ml  Net -2155 ml   Filed Weights   03/27/22 0450 03/28/22 0504 03/29/22 0508  Weight: 109.6 kg 110.1 kg 106.8  kg    Physical Exam   GEN: Well nourished, well developed, in no acute distress.  HEENT: Grossly normal.  Neck: Supple, mildly elevated JVP, no carotid bruits, or masses. Cardiac: irregularly irregular, no murmurs, rubs, or gallops. No clubbing or cyanosis. Trace edema.  Radials 2+, DP/PT 2+ and equal bilaterally.  Respiratory:  Respirations regular and unlabored, diminished breath sounds and  mild crackles at bases bilaterally. GI: semi-firm and protuberant, BS + x 4. MS: no deformity or atrophy. Skin: warm and dry, no rash. Neuro:  Strength and sensation are intact. Psych: AAOx3.  Normal affect.  Labs    Chemistry Recent Labs  Lab 03/27/22 1041 03/28/22 1211 03/29/22 0643  NA 137 137 141  K 3.7 3.9 3.4*  CL 105 102 105  CO2 '23 25 28  '$ GLUCOSE 235* 264* 136*  BUN '15 15 14  '$ CREATININE 0.89 1.08 0.99  CALCIUM 8.3* 8.5* 8.8*  GFRNONAA >60 >60 >60  ANIONGAP '9 10 8     '$ Hematology Recent Labs  Lab 03/26/22 0438 03/27/22 1041 03/28/22 1211  WBC 9.0 8.9 9.7  RBC 2.94* 3.02* 3.29*  HGB 8.8* 8.9* 9.6*  HCT 26.1* 27.1* 29.5*  MCV 88.8 89.7 89.7  MCH 29.9 29.5 29.2  MCHC 33.7 32.8 32.5  RDW 18.0* 18.6* 18.9*  PLT 64* 75* 77*    Cardiac Enzymes  Recent Labs  Lab 03/22/22 1021 03/22/22 1227 03/22/22 1430 03/22/22 1820 03/22/22 2252  TROPONINIHS 69* 72* 66* 62* 72*      BNP    Component Value Date/Time   BNP 431.0 (H) 03/22/2022 1015    Lipids  Lab Results  Component Value Date   CHOL 62 03/22/2022   HDL 20 (L) 03/22/2022   LDLCALC 27 03/22/2022   TRIG 75 03/22/2022   CHOLHDL 3.1 03/22/2022    HbA1c  Lab Results  Component Value Date   HGBA1C 7.6 (H) 03/22/2022    Radiology    --------------  Telemetry    Atrial Fibrillation, rate 60-70s - Personally Reviewed  ECG    No new tracings - Personally Reviewed  Cardiac Studies   RHC/LHC 03/26/22: Diagnostic Dominance: Right    1.  Significant two-vessel coronary artery disease with  patent LAD stents with minimal restenosis.  Chronically occluded right coronary artery with left-to-right collaterals.  Moderate left circumflex disease.  No significant change overall since most recent cardiac catheterization.  The coronary arteries are moderately calcified and diffusely diseased. 2.  Moderately reduced LV systolic function with an EF of 35 to 40%. 3.  Right heart catheterization showed moderately elevated filling pressures, mild pulmonary hypertension and normal cardiac output.  Pulmonary wedge pressure was 25 mmHg with very prominent V waves suggestive of significant mitral regurgitation.   Recommendations: The patient is still volume overloaded.  Continue diuresis. Recommend optimization of systolic heart failure. Mitral regurgitation might be underestimated by echocardiogram.  Consider transesophageal echocardiogram once his heart failure is optimized.  _____________   Echo 03/23/22:  1. Left ventricular ejection fraction, by estimation, is 50 to 55%. The  left ventricle has low normal function. The left ventricle demonstrates  regional wall motion abnormalities (see scoring diagram/findings for  description). There is moderate left  ventricular hypertrophy. Left ventricular diastolic function could not be  evaluated. There is mild hypokinesis of the left ventricular, basal  inferior and inferolateral segments.   2. Right ventricular systolic function is normal. The right ventricular  size is mildly enlarged. There is moderately elevated pulmonary artery  systolic pressure. The estimated right ventricular systolic pressure is  65.4 mmHg.   3. Left atrial size was mildly dilated.   4. Right atrial size was mildly dilated.   5. The mitral valve is abnormal. Mild to moderate mitral valve  regurgitation.   6. Tricuspid valve regurgitation is mild to moderate.   7. The aortic valve is tricuspid. There is mild thickening of the aortic  valve. Aortic valve regurgitation is  not visualized. Aortic valve  sclerosis is present, with no evidence of aortic valve stenosis.   8. Aortic dilatation noted. There is borderline dilatation of the aortic  root and of the ascending aorta, measuring 38 mm.   9. The inferior vena cava is dilated in size with <50% respiratory  variability, suggesting right atrial pressure of 15 mmHg.   Patient Profile     81 y.o. male with obstructive coronary disease (chronically occluded RCA with left-to-right collaterals, moderate left circumflex disease, patent LAD stents, 80% distal LAD, 90% D1, 50% OM 3) diabetes, permanent atrial fibrillation, tobacco abuse, hypertension, and hyperlipidemia admitted with chest pain, anemia, and mildly elevated troponin.    Assessment & Plan    1. Acute systolic and diastolic heart failure/ Mitral regurgitation:  Patient admitted with slightly elevated troponins with peak of 72, likely demand ischemia from  volume excess and anemia, although originally thought to have NSTEMI. Echo revealed LVEF 35-40, down from 50-55% on previous echo.  His right heart cath revealed elevated right heart pressures.  Echo was also notable for mild to moderate mitral regurgitation.  At the time of cath it was thought this may be more severe (prominent V waves).  He has responded well to IV lasix.  He is down -2.9 L in the last 24 hours and down 7.9 L since admission.  He remains at least mildly volume overloaded w/ JVD, bibasilar crackles, and some inc in abd girth.  Cont IV diuresis today (BUN/Creat stable, though bicarb coming up).  Re: MR, will consider TEE w/in next 24-48 hrs.   BP stable in the low 100s on Toprol-XL 100 mg daily.  Farxiga added yesterday.  In the setting of relative hypotension, no acei/arb/arni/mra.  Continue to monitor kidney fxn and strict I&Os.   2. Hypertension: BP stable in low 100s on Toprol-XL 100 mg daily.  Consider ACE-I/ARB/Entresto/MRA as BP allows.    3. CAD: Cath this admission revealed patent LAD  stents and otherwise unchanged from prior (CTO RCA).  No chest pain.  Continue aspirin, ? blocker, and atorvastatin.  4. Permanent Atrial Fibrillation: Eliquis currently on hold given concern for GI bleed and anemia.  EGD showed no identifiable source, colonoscopy not completed d/t hypotension during EGD. Metoprolol increased from 50 to 100 mg on 7/21 for elevated rates, now stabilized. Restarting anticoagulation today.  5. Hyperlipidemia: LDL 27 on 03/22/22, at goal of <70.  Continue Atorvastatin 40 mg daily.   6. Acute on chronic anemia: He has had acute on chronic anemia this admission with hemoglobin of 7.5 requiring transfusion.  Hemoglobin stabilized at 9.6 yesterday. No clear source was identified on EGD.  He developed hypotension and they did not proceed with colonoscopy.  No further GI workup given increased risks with sedation.  Recommend restarting Eliquis for Afib as hemoglobin has been stable. Continue on aspirin. Monitor H&H.  7. Hypokalemia: Potassium 3.4 today on labs.  Replacement per primary team.  Trend/Replete with goal of 4.   8. Diabetes Mellitus Type 2: A1c 7.6 on 03/22/22.  Management per IM.   Signed, Murray Hodgkins, NP  03/29/2022, 8:09 AM    For questions or updates, please contact   Please consult [email or web address] for contact info under Cardiology/STEMI.

## 2022-03-30 ENCOUNTER — Encounter
Admission: EM | Disposition: A | Discharge status: Home Health | Payer: Self-pay | Source: Home / Self Care | Attending: Internal Medicine

## 2022-03-30 ENCOUNTER — Inpatient Hospital Stay (HOSPITAL_COMMUNITY)
Admit: 2022-03-30 | Discharge: 2022-03-30 | Disposition: A | Discharge status: Home / Self Care | Payer: Medicare HMO | Attending: Cardiovascular Disease | Admitting: Cardiovascular Disease

## 2022-03-30 DIAGNOSIS — I361 Nonrheumatic tricuspid (valve) insufficiency: Secondary | ICD-10-CM | POA: Diagnosis not present

## 2022-03-30 DIAGNOSIS — I5041 Acute combined systolic (congestive) and diastolic (congestive) heart failure: Secondary | ICD-10-CM | POA: Diagnosis not present

## 2022-03-30 DIAGNOSIS — I340 Nonrheumatic mitral (valve) insufficiency: Secondary | ICD-10-CM

## 2022-03-30 DIAGNOSIS — I248 Other forms of acute ischemic heart disease: Secondary | ICD-10-CM

## 2022-03-30 DIAGNOSIS — D649 Anemia, unspecified: Secondary | ICD-10-CM | POA: Diagnosis not present

## 2022-03-30 DIAGNOSIS — I5033 Acute on chronic diastolic (congestive) heart failure: Secondary | ICD-10-CM | POA: Diagnosis not present

## 2022-03-30 DIAGNOSIS — I482 Chronic atrial fibrillation, unspecified: Secondary | ICD-10-CM | POA: Diagnosis not present

## 2022-03-30 HISTORY — PX: TEE WITHOUT CARDIOVERSION: SHX5443

## 2022-03-30 LAB — BASIC METABOLIC PANEL
Anion gap: 10 (ref 5–15)
BUN: 18 mg/dL (ref 8–23)
CO2: 28 mmol/L (ref 22–32)
Calcium: 8.9 mg/dL (ref 8.9–10.3)
Chloride: 99 mmol/L (ref 98–111)
Creatinine, Ser: 1.22 mg/dL (ref 0.61–1.24)
GFR, Estimated: 60 mL/min — ABNORMAL LOW (ref >60)
Glucose, Bld: 208 mg/dL — ABNORMAL HIGH (ref 70–99)
Potassium: 3.4 mmol/L — ABNORMAL LOW (ref 3.5–5.1)
Sodium: 137 mmol/L (ref 135–145)

## 2022-03-30 LAB — GLUCOSE, CAPILLARY
Glucose-Capillary: 128 mg/dL — ABNORMAL HIGH (ref 70–99)
Glucose-Capillary: 261 mg/dL — ABNORMAL HIGH (ref 70–99)
Glucose-Capillary: 263 mg/dL — ABNORMAL HIGH (ref 70–99)
Glucose-Capillary: 197 mg/dL — ABNORMAL HIGH (ref 70–99)

## 2022-03-30 SURGERY — ECHOCARDIOGRAM, TRANSESOPHAGEAL
Anesthesia: Moderate Sedation

## 2022-03-30 MED ORDER — FENTANYL CITRATE (PF) 100 MCG/2ML IJ SOLN
INTRAMUSCULAR | Status: AC | PRN
  Administered 2022-03-30: 25 ug via INTRAVENOUS

## 2022-03-30 MED ORDER — LIDOCAINE VISCOUS HCL 2 % MT SOLN
OROMUCOSAL | Status: AC
  Filled 2022-03-30: qty 15

## 2022-03-30 MED ORDER — MIDAZOLAM HCL 2 MG/2ML IJ SOLN
INTRAMUSCULAR | Status: AC
  Filled 2022-03-30: qty 2

## 2022-03-30 MED ORDER — BUTAMBEN-TETRACAINE-BENZOCAINE 2-2-14 % EX AERO
INHALATION_SPRAY | CUTANEOUS | Status: AC
  Filled 2022-03-30: qty 5

## 2022-03-30 MED ORDER — BUTAMBEN-TETRACAINE-BENZOCAINE 2-2-14 % EX AERO
INHALATION_SPRAY | CUTANEOUS | Status: AC | PRN
  Administered 2022-03-30: 1 via TOPICAL

## 2022-03-30 MED ORDER — FENTANYL CITRATE (PF) 100 MCG/2ML IJ SOLN
INTRAMUSCULAR | Status: AC
  Filled 2022-03-30: qty 2

## 2022-03-30 MED ORDER — FENTANYL CITRATE (PF) 100 MCG/2ML IJ SOLN
INTRAMUSCULAR | Status: AC | PRN
  Administered 2022-03-30 (×2): 25 ug via INTRAVENOUS

## 2022-03-30 MED ORDER — POTASSIUM CHLORIDE CRYS ER 20 MEQ PO TBCR
40.0000 meq | EXTENDED_RELEASE_TABLET | Freq: Once | ORAL | Status: AC
  Administered 2022-03-30: 40 meq via ORAL
  Filled 2022-03-30: qty 2

## 2022-03-30 MED ORDER — MIDAZOLAM HCL 2 MG/2ML IJ SOLN
INTRAMUSCULAR | Status: AC | PRN
  Administered 2022-03-30 (×4): 1 mg via INTRAVENOUS

## 2022-03-30 NOTE — Progress Notes (Addendum)
Progress Note    CESARE SUMLIN  SEG:[id number] DOB: 03/27/1941  DOA: 03/22/2022 PCP: Baxter Hire, MD      Brief Narrative:    Medical records reviewed and are as summarized below:  RODEL GLASPY is a 81 y.o. male with medical history significant of CAD with stent, hypertension, hyperlipidemia, diabetes mellitus, atrial fibrillation and PE on Eliquis, BPH, kidney stone, thrombocytopenia, peptic ulcer disease, chronic diastolic CHF, who presented to the hospital with intermittent chest pain, exertional shortness of breath.  He also reported dark stools which she attributed to some juice.  He was admitted to the hospital for acute on chronic systolic and diastolic CHF.  He was treated with IV Lasix.  Elevated troponins were attributed to demand ischemia.  He was transfused with 3 units of PRBCs for symptomatic anemia.  He was also given IV iron infusion for iron deficiency anemia.  He underwent EGD which showed a single gastric polyp.  There was no evidence of bleeding.  Colonoscopy was not performed because of concerns that patient dropped his blood pressure requiring Neo-Synephrine drip.  EGD. He developed atrial fibrillation with RVR requiring IV Cardizem bolus.  Metoprolol XL has been increased from 50 to 100 mg daily.      Assessment/Plan:   Principal Problem:   Chest pain Active Problems:   CAD (coronary artery disease)   Chronic diastolic CHF (congestive heart failure) (HCC)   Atrial fibrillation, chronic (HCC)   Hx of pulmonary embolus   Diabetes mellitus without complication (HCC)   Thrombocytopenia (HCC)   BPH with obstruction/lower urinary tract symptoms   Normocytic anemia   Hypertension   HLD (hyperlipidemia)   Anxiety   Acute on chronic heart failure with preserved ejection fraction (HFpEF) (HCC)   Acute combined systolic and diastolic heart failure (HCC)    Body mass index is 29.69 kg/m.  (Obesity)  Iron deficiency anemia, symptomatic anemia:  Hemoglobin has improved to 9.6.  S/p treatment with IV iron sucrose on 7/22 on 7/23.  S/p transfusion with 1 unit of PRBCs on 03/25/2022.  S/p transfusion with 1 units of PRBCs 03/24/2022.  S/p transfusion with 1 unit of PRBCs on 03/22/2022.  No overt GI bleeding.  S/p EGD on 03/24/2022 which showed single gastric polyp.  No plan for colonoscopy per Dr. Virgina Jock, gastroenterologist.  Patient's blood pressure dropped during EGD requiring Neo-Synephrine.. Reengaged Dr. Virgina Jock, gastroenterologist, on 03/29/2022.  He still thinks that colonoscopy can be avoided at this time since patient's hemoglobin has remained stable  Elevated troponin, CAD with PCI to LAD (CTO dRCA, 80% distal LAD, 50% prox Lcx): No NSTEMI per cardiologist.  S/p left and right heart cath on 03/26/2022.  This revealed significant two-vessel CAD with patent LAD stents with minimal restenosis.  He has chronically occluded  RCA with left to right collaterals.    (Distal RCA lesion is 100% stenosed, OST 1st diagonal lesion is 90% stenosed, Ost 3rd marginal lesion is 50% stenosed, proximal RCA lesion is 100% stenosed, proximal Cx lesion is 50% stenosed).  EF is 35 to 40%, moderately elevated filling pressures, moderate pulmonary hypertension and normal cardiac output.  Findings also suggestive of significant mitral regurgitation)   Chronic atrial fibrillation with RVR: Overall, heart rate is better.  Continue Toprol-XL.  Cardiologist has restarted Eliquis for stroke prophylaxis.  Case discussed with Dr. Fletcher Anon, cardiologist.  Hypokalemia: Replete potassium and monitor levels.   Hypotension: BP dropped to 85/42 today.  It has improved to 102/46.  Monitor BP closely.  Follow-up with cardiologist for further recommendations  Acute on chronic systolic and diastolic CHF: Continue IV Lasix per cardiologist.  2D echo in 03/23/2022 showed EF estimated at 50 to 55%, moderate LVH, moderate to elevated pulmonary artery systolic pressure, mild to moderate MR, mild  to moderate TR. EF by cath was 35 to 40%.  S/p TEE on 03/30/2022 which showed EF estimated at 50 to 60%, moderate mitral regurgitation, mild to moderate tricuspid regurgitation mild (grade 2) atheroma plaque involving the aortic arch and descending aorta, no evidence of atrial thrombus.  History of pulmonary embolism: On Eliquis  Chronic thrombocytopenia: Platelet count is stable  Generalized weakness: Home health PT and OT recommended     Diet Order             Diet heart healthy/carb modified Room service appropriate? Yes; Fluid consistency: Thin  Diet effective now                            Consultants: Cardiologist Gastroenterologist  Procedures: EGD on 03/24/2022 Left and right heart cath on 03/26/2022    Medications:    apixaban  5 mg Oral BID   aspirin EC  81 mg Oral Daily   atorvastatin  40 mg Oral Daily   cyanocobalamin  1,000 mcg Intramuscular Q30 days   dapagliflozin propanediol  10 mg Oral Daily   finasteride  5 mg Oral Daily   furosemide  80 mg Intravenous BID   insulin aspart  0-5 Units Subcutaneous QHS   insulin aspart  0-9 Units Subcutaneous TID WC   metoprolol succinate  100 mg Oral Daily   multivitamin with minerals  1 tablet Oral Daily   omega-3 acid ethyl esters  2 g Oral Daily   pantoprazole  40 mg Oral BID   polyethylene glycol  17 g Oral Daily   potassium chloride  40 mEq Oral Once   sodium chloride flush  3 mL Intravenous Q12H   sucralfate  1 g Oral QID   tamsulosin  0.4 mg Oral Daily   Continuous Infusions:  sodium chloride     sodium chloride        Anti-infectives (From admission, onward)    None              Family Communication/Anticipated D/C date and plan/Code Status   DVT prophylaxis:  apixaban (ELIQUIS) tablet 5 mg     Code Status: Full Code  Family Communication: None Disposition Plan: Plan to discharge home in 1 to 2 days   Status is: Inpatient Remains inpatient appropriate because:  Symptomatic anemia, CHF exacerbation       Subjective:   Interval events noted.  He had TEE today.  He said he feels "about the same".  Objective:    Vitals:   03/30/22 0846 03/30/22 1147 03/30/22 1202 03/30/22 1241  BP: (!) 107/50 (!) 102/46    Pulse:  62    Resp: '12 16 20 19  '$ Temp:  97.7 F (36.5 C)    TempSrc:      SpO2:  94%    Weight:      Height:       No data found.   Intake/Output Summary (Last 24 hours) at 03/30/2022 1423 Last data filed at 03/30/2022 1146 Gross per 24 hour  Intake 360 ml  Output 4850 ml  Net -4490 ml   Filed Weights   03/28/22 0504 03/29/22 0508 03/30/22 0500  Weight: 110.1 kg 106.8 kg 104.9 kg    Exam:  GEN: NAD SKIN: Warm and dry EYES: EOMI ENT: MMM CV: Irregular rate and rhythm PULM: Mild bibasilar rales, no wheezing ABD: soft, ND, NT, +BS CNS: AAO x 3, non focal EXT: No edema or tenderness       Data Reviewed:   I have personally reviewed following labs and imaging studies:  Labs: Labs show the following:   Basic Metabolic Panel: Recent Labs  Lab 03/25/22 0431 03/26/22 0438 03/27/22 1041 03/28/22 1211 03/29/22 0643 Apr 28, 2022 1114  NA 137 138 137 137 141 137  K 3.2* 3.5 3.7 3.9 3.4* 3.4*  CL 108 110 105 102 105 99  CO2 '22 23 23 25 28 28  '$ GLUCOSE 121* 127* 235* 264* 136* 208*  BUN '17 14 15 15 14 18  '$ CREATININE 0.99 0.84 0.89 1.08 0.99 1.22  CALCIUM 8.0* 8.1* 8.3* 8.5* 8.8* 8.9  MG 1.7 1.8  --   --  2.0  --    GFR Estimated Creatinine Clearance: 62.4 mL/min (by C-G formula based on SCr of 1.22 mg/dL). Liver Function Tests: No results for input(s): "AST", "ALT", "ALKPHOS", "BILITOT", "PROT", "ALBUMIN" in the last 168 hours. No results for input(s): "LIPASE", "AMYLASE" in the last 168 hours. No results for input(s): "AMMONIA" in the last 168 hours. Coagulation profile No results for input(s): "INR", "PROTIME" in the last 168 hours.   CBC: Recent Labs  Lab 03/24/22 0439 03/25/22 0431 03/25/22 1409  03/26/22 0438 03/27/22 1041 03/28/22 1211  WBC 8.7 7.5  --  9.0 8.9 9.7  HGB 7.8* 7.5* 8.5* 8.8* 8.9* 9.6*  HCT 23.1* 22.2* 25.8* 26.1* 27.1* 29.5*  MCV 87.8 88.1  --  88.8 89.7 89.7  PLT 59* 62*  --  64* 75* 77*   Cardiac Enzymes: No results for input(s): "CKTOTAL", "CKMB", "CKMBINDEX", "TROPONINI" in the last 168 hours. BNP (last 3 results) No results for input(s): "PROBNP" in the last 8760 hours. CBG: Recent Labs  Lab 03/29/22 1202 03/29/22 1511 03/29/22 2108 04/28/2022 0725 28-Apr-2022 1145  GLUCAP 234* 286* 234* 128* 261*   D-Dimer: No results for input(s): "DDIMER" in the last 72 hours. Hgb A1c: No results for input(s): "HGBA1C" in the last 72 hours.  Lipid Profile: No results for input(s): "CHOL", "HDL", "LDLCALC", "TRIG", "CHOLHDL", "LDLDIRECT" in the last 72 hours.  Thyroid function studies: No results for input(s): "TSH", "T4TOTAL", "T3FREE", "THYROIDAB" in the last 72 hours.  Invalid input(s): "FREET3" Anemia work up: No results for input(s): "VITAMINB12", "FOLATE", "FERRITIN", "TIBC", "IRON", "RETICCTPCT" in the last 72 hours.  Sepsis Labs: Recent Labs  Lab 03/25/22 0431 03/26/22 0438 03/27/22 1041 03/28/22 1211  WBC 7.5 9.0 8.9 9.7    Microbiology No results found for this or any previous visit (from the past 240 hour(s)).  Procedures and diagnostic studies:  ECHO TEE  Result Date: 04-28-22    TRANSESOPHOGEAL ECHO REPORT   Patient Name:   ADELAIDO NICKLAUS Date of Exam: 04/28/22 Medical Rec #:  [id number]      Height:       74.0 in Accession #:    [id number]     Weight:       231.3 lb Date of Birth:  24-Apr-1941       BSA:          2.312 m Patient Age:    15 years       BP:           92/56 mmHg  Patient Gender: M              HR:           45 bpm. Exam Location:  [facility] Procedure: Transesophageal Echo, Cardiac Doppler, Color Doppler and Saline            Contrast Bubble Study Indications:     Mitral Regurgitation  History:         Patient has prior history of  Echocardiogram examinations, most                  recent 03/23/2022. CAD and Previous Myocardial Infarction; Risk                  Factors:Diabetes.  Sonographer:     Sherrie Sport Referring Phys:  [place] Diagnosing Phys: Ida Rogue MD PROCEDURE: TEE procedure time was 25 minutes. The transesophogeal probe was passed without difficulty through the esophogus of the patient. Imaged were obtained with the patient in a left lateral decubitus position. Local oropharyngeal anesthetic was provided with viscous lidocaine and Cetacaine. Sedation performed by performing physician. Patients was under conscious sedation during this procedure. Anesthetic administered: 132mg of Fentanyl, 4.'0mg'$  of Versed. Image quality was excellent. The patient's vital signs; including heart rate, blood pressure, and oxygen saturation; remained stable throughout the procedure. The patient developed no complications during the procedure. IMPRESSIONS  1. Left ventricular ejection fraction, by estimation, is 55 to 60%. The left ventricle has normal function. The left ventricle has no regional wall motion abnormalities.  2. Right ventricular systolic function is normal. The right ventricular size is normal.  3. Left atrial size was moderately dilated. No left atrial/left atrial appendage thrombus was detected.  4. The mitral valve is normal in structure. Moderate mitral valve regurgitation, multiple jets noted. No evidence of mitral stenosis.  5. Tricuspid valve regurgitation is mild to moderate.  6. The aortic valve is normal in structure. Aortic valve regurgitation is not visualized. No aortic stenosis is present.  7. There is mild (Grade II) atheroma plaque involving the aortic arch and descending aorta.  8. The inferior vena cava is normal in size with greater than 50% respiratory variability, suggesting right atrial pressure of 3 mmHg.  9. Agitated saline contrast bubble study was negative, with no evidence of any interatrial  shunt. Conclusion(s)/Recommendation(s): Normal biventricular function without evidence of hemodynamically significant valvular heart disease. FINDINGS  Left Ventricle: Left ventricular ejection fraction, by estimation, is 55 to 60%. The left ventricle has normal function. The left ventricle has no regional wall motion abnormalities. The left ventricular internal cavity size was normal in size. There is  no left ventricular hypertrophy. Right Ventricle: The right ventricular size is normal. No increase in right ventricular wall thickness. Right ventricular systolic function is normal. Left Atrium: Left atrial size was moderately dilated. No left atrial/left atrial appendage thrombus was detected. Right Atrium: Right atrial size was normal in size. Pericardium: There is no evidence of pericardial effusion. Mitral Valve: The mitral valve is normal in structure. Moderate mitral valve regurgitation. No evidence of mitral valve stenosis. Tricuspid Valve: The tricuspid valve is normal in structure. Tricuspid valve regurgitation is mild to moderate. No evidence of tricuspid stenosis. Aortic Valve: The aortic valve is normal in structure. Aortic valve regurgitation is not visualized. No aortic stenosis is present. Pulmonic Valve: The pulmonic valve was normal in structure. Pulmonic valve regurgitation is not visualized. No evidence of pulmonic stenosis. Aorta: The aortic root is normal in  size and structure. There is mild (Grade II) atheroma plaque involving the aortic arch and descending aorta. Venous: The inferior vena cava is normal in size with greater than 50% respiratory variability, suggesting right atrial pressure of 3 mmHg. IAS/Shunts: No atrial level shunt detected by color flow Doppler. Agitated saline contrast was given intravenously to evaluate for intracardiac shunting. Agitated saline contrast bubble study was negative, with no evidence of any interatrial shunt. Ida Rogue MD Electronically signed by  Ida Rogue MD Signature Date/Time: 03/30/2022/12:51:45 PM    Final                LOS: 8 days   Jennye Boroughs  [facility]   Pager on [email or web address]. If 7PM-7AM, please contact night-coverage at [email or web address]     03/30/2022, 2:23 PM

## 2022-03-30 NOTE — Progress Notes (Signed)
Cardiology Progress Note   Patient Name: Cody Price Date of Encounter: 03/30/2022  Primary Cardiologist: Ida Rogue, MD  Subjective   Patient feeling better overall.  Denies chest pain or worsening shortness of breath.  Inpatient Medications    Scheduled Meds:  apixaban  5 mg Oral BID   aspirin EC  81 mg Oral Daily   atorvastatin  40 mg Oral Daily   cyanocobalamin  1,000 mcg Intramuscular Q30 days   dapagliflozin propanediol  10 mg Oral Daily   finasteride  5 mg Oral Daily   furosemide  80 mg Intravenous BID   insulin aspart  0-5 Units Subcutaneous QHS   insulin aspart  0-9 Units Subcutaneous TID WC   metoprolol succinate  100 mg Oral Daily   multivitamin with minerals  1 tablet Oral Daily   omega-3 acid ethyl esters  2 g Oral Daily   pantoprazole  40 mg Oral BID   polyethylene glycol  17 g Oral Daily   sodium chloride flush  3 mL Intravenous Q12H   sucralfate  1 g Oral QID   tamsulosin  0.4 mg Oral Daily   Continuous Infusions:  sodium chloride     sodium chloride     PRN Meds: sodium chloride, oxyCODONE **AND** acetaminophen, acetaminophen, bisacodyl, dextromethorphan-guaiFENesin, diazepam, hydrALAZINE, ipratropium-albuterol, melatonin, methocarbamol, morphine injection, ondansetron (ZOFRAN) IV, mouth rinse, sodium chloride flush   Vital Signs    Vitals:   03/30/22 0823 03/30/22 0828 03/30/22 0838 03/30/22 0846  BP: (!) 100/57 (!) 96/51 (!) 100/53 (!) 107/50  Pulse: 73 65    Resp: '16 12 16 12  '$ Temp:      TempSrc:      SpO2: 96% 94%    Weight:      Height:        Intake/Output Summary (Last 24 hours) at 03/30/2022 1035 Last data filed at 03/30/2022 0924 Gross per 24 hour  Intake 360 ml  Output 4950 ml  Net -4590 ml   Filed Weights   03/28/22 0504 03/29/22 0508 03/30/22 0500  Weight: 110.1 kg 106.8 kg 104.9 kg    Physical Exam   GEN: Well nourished, well developed, in no acute distress.  HEENT: Grossly normal.  Neck: Supple, mildly  elevated JVP, no carotid bruits, or masses. Cardiac:  irregularly irregular, no murmurs, rubs, or gallops. No clubbing or cyanosis. Trace edema.  Radials 2+, DP/PT 2+ and equal bilaterally.  Respiratory:  Respirations regular and unlabored, diminished breath sounds and  mild crackles at bases bilaterally. GI: Semi-firm and protuberant, BS + x 4. MS: no deformity or atrophy. Skin: warm and dry, no rash. Neuro:  Strength and sensation are intact. Psych: AAOx3.  Normal affect.  Labs    Chemistry Recent Labs  Lab 03/27/22 1041 03/28/22 1211 03/29/22 0643  NA 137 137 141  K 3.7 3.9 3.4*  CL 105 102 105  CO2 '23 25 28  '$ GLUCOSE 235* 264* 136*  BUN '15 15 14  '$ CREATININE 0.89 1.08 0.99  CALCIUM 8.3* 8.5* 8.8*  GFRNONAA >60 >60 >60  ANIONGAP '9 10 8     '$ Hematology Recent Labs  Lab 03/26/22 0438 03/27/22 1041 03/28/22 1211  WBC 9.0 8.9 9.7  RBC 2.94* 3.02* 3.29*  HGB 8.8* 8.9* 9.6*  HCT 26.1* 27.1* 29.5*  MCV 88.8 89.7 89.7  MCH 29.9 29.5 29.2  MCHC 33.7 32.8 32.5  RDW 18.0* 18.6* 18.9*  PLT 64* 75* 77*    Cardiac Enzymes  Recent Labs  Lab  03/22/22 1021 03/22/22 1227 03/22/22 1430 03/22/22 1820 03/22/22 2252  TROPONINIHS 69* 72* 66* 62* 72*      BNP    Component Value Date/Time   BNP 431.0 (H) 03/22/2022 1015   Lipids  Lab Results  Component Value Date   CHOL 62 03/22/2022   HDL 20 (L) 03/22/2022   LDLCALC 27 03/22/2022   TRIG 75 03/22/2022   CHOLHDL 3.1 03/22/2022    HbA1c  Lab Results  Component Value Date   HGBA1C 7.6 (H) 03/22/2022    Radiology    ------------  Telemetry    Afib, rate of 60-70's - Personally Reviewed  Cardiac Studies   RHC/LHC 03/26/22: Diagnostic Dominance: Right    1.  Significant two-vessel coronary artery disease with patent LAD stents with minimal restenosis.  Chronically occluded right coronary artery with left-to-right collaterals.  Moderate left circumflex disease.  No significant change overall since most  recent cardiac catheterization.  The coronary arteries are moderately calcified and diffusely diseased. 2.  Moderately reduced LV systolic function with an EF of 35 to 40%. 3.  Right heart catheterization showed moderately elevated filling pressures, mild pulmonary hypertension and normal cardiac output.  Pulmonary wedge pressure was 25 mmHg with very prominent V waves suggestive of significant mitral regurgitation.   Recommendations: The patient is still volume overloaded.  Continue diuresis. Recommend optimization of systolic heart failure. Mitral regurgitation might be underestimated by echocardiogram.  Consider transesophageal echocardiogram once his heart failure is optimized.  _____________    Echo 03/23/22:  1. Left ventricular ejection fraction, by estimation, is 50 to 55%. The  left ventricle has low normal function. The left ventricle demonstrates  regional wall motion abnormalities (see scoring diagram/findings for  description). There is moderate left  ventricular hypertrophy. Left ventricular diastolic function could not be  evaluated. There is mild hypokinesis of the left ventricular, basal  inferior and inferolateral segments.   2. Right ventricular systolic function is normal. The right ventricular  size is mildly enlarged. There is moderately elevated pulmonary artery  systolic pressure. The estimated right ventricular systolic pressure is  10.1 mmHg.   3. Left atrial size was mildly dilated.   4. Right atrial size was mildly dilated.   5. The mitral valve is abnormal. Mild to moderate mitral valve  regurgitation.   6. Tricuspid valve regurgitation is mild to moderate.   7. The aortic valve is tricuspid. There is mild thickening of the aortic  valve. Aortic valve regurgitation is not visualized. Aortic valve  sclerosis is present, with no evidence of aortic valve stenosis.   8. Aortic dilatation noted. There is borderline dilatation of the aortic  root and of the  ascending aorta, measuring 38 mm.   9. The inferior vena cava is dilated in size with <50% respiratory  variability, suggesting right atrial pressure of 15 mmHg.   Patient Profile     81 y.o. male with obstructive coronary disease (chronically occluded RCA with left-to-right collaterals, moderate left circumflex disease, patent LAD stents, 80% distal LAD, 90% D1, 50% OM 3) diabetes, permanent atrial fibrillation, tobacco abuse, hypertension, and hyperlipidemia admitted with chest pain, anemia, and mildly elevated troponin.  Assessment & Plan    1. Acute systolic and diastolic heart failure/ Mitral regurgitation:  Patient admitted with slightly elevated troponins with peak of 72, likely demand ischemia from volume excess and anemia, although originally thought to have NSTEMI. Echo revealed LVEF 35-40, down from 50-55% on previous echo.  His right heart cath revealed elevated  right heart pressures.  Echo was also notable for mild to moderate mitral regurgitation.  At the time of cath it was thought this may be more severe (prominent V waves).  He has responded well to IV lasix.  Minus 4.3L on 7/24 and minus 11L since admission.  Wt down to 104.9 kg.  He remains at least mildly volume overloaded w/ JVD, bibasilar crackles, and some inc in abd girth.  Cont IV diuresis today (bmet pending this AM).  TEE completed this morning, only mod MR. BP stable in the low 100s on Toprol-XL 100 mg daily.  Farxiga added 7/23.  In the setting of relative hypotension, no acei/arb/arni/mra.  Continue to monitor kidney fxn and strict I&Os.    2. Hypertension: BP stable in low 100s on Toprol-XL 100 mg daily.  Consider ACE-I/ARB/Entresto/MRA as BP allows.     3. CAD: Cath this admission revealed patent LAD stents and otherwise unchanged from prior (CTO RCA).  No chest pain.  Continue aspirin, ? blocker, and atorvastatin.   4. Permanent Atrial Fibrillation: Eliquis resumed 7/24.  H/H relatively stable.  Rate controlled.    5. Hyperlipidemia: LDL 27 on 03/22/22, at goal of <70.  Continue Atorvastatin 40 mg daily.    6. Acute on chronic anemia: He has had acute on chronic anemia this admission with hemoglobin of 7.5 requiring transfusion.  Hemoglobin stabilized at 9.6 yesterday. No clear source was identified on EGD.  He developed hypotension and they did not proceed with colonoscopy.  No further GI workup given increased risks with sedation.  Eliquis resumed 7/24.  H/H relatively stable.   7. Hypokalemia: Potassium 3.4 yesterday on labs.  BMET pending this AM.   8. Diabetes Mellitus Type 2: A1c 7.6 on 03/22/22.  Management per IM.     Signed, Murray Hodgkins, NP  03/30/2022, 10:35 AM    For questions or updates, please contact   Please consult [email or web address] for contact info under Cardiology/STEMI.

## 2022-03-30 NOTE — Progress Notes (Signed)
*  PRELIMINARY RESULTS* Echocardiogram Echocardiogram Transesophageal has been performed.  Sherrie Sport 03/30/2022, 8:19 AM

## 2022-03-30 NOTE — Progress Notes (Signed)
Occupational Therapy Treatment Patient Details Name: Cody Price MRN: [id number] DOB: 08-14-1941 Today's Date: 03/30/2022   History of present illness Pt. is an 2yoM PMH CAD s/p stent, HTN, HLD, DM, Afib, PE on Eliquis, BPH, kidney stone, thrombocytopenia, PUD, dCHF, who presented to the hospital 03/22/22 with intermittent chest pain, exertional shortness of breath. No NSTEMI per cardiologist.  S/p left and right heart cath on 03/26/2022.   OT comments  Pt. Reports 10/10 back pain. Pt. Education was provided about energy conservation/work simplification techniques, and strategies during ADLs, and IADL tasks. Pt. Reports that he his out in is yard from daily 8 a.m. until 5 or 6pm tending his 26 acres on his mower. Pt. Reports that he outsources the heavier yard tasks, and weed eating. Reviewed with strategies that he currently uses in the yard, and at home while cooking. Pt. Reports that he uses a stool while cooking. Additional strategies were reviewed with the pt, and a visual handout was provided. Pt. Continues to benefit from OT services for ADL training, A/E training, and pt. Education about energy conservation, work simplification, home modification, and DME. Pt. Plans to return home upon discharge with family to assist pt. as needed. Pt. Could benefit from follow-up [facility] services upon discharge.    Recommendations for follow up therapy are one component of a multi-disciplinary discharge planning process, led by the attending physician.  Recommendations may be updated based on patient status, additional functional criteria and insurance authorization.    Follow Up Recommendations  Home health OT    Assistance Recommended at Discharge PRN  Patient can return home with the following  A little help with bathing/dressing/bathroom;Assistance with cooking/housework;Assist for transportation   Equipment Recommendations  Tub/shower seat    Recommendations for Other Services      Precautions /  Restrictions Precautions Precautions: Fall Restrictions Weight Bearing Restrictions: No       Mobility Bed Mobility                    Transfers      Deferred                   Balance                                           ADL either performed or assessed with clinical judgement   ADL  Reviewed energy conservation/work simplification strategies for ADLs, and ADLs.                                            Extremity/Trunk Assessment Upper Extremity Assessment Upper Extremity Assessment: Overall WFL for tasks assessed;Generalized weakness            Vision Patient Visual Report: No change from baseline     Perception     Praxis      Cognition Arousal/Alertness: Awake/alert Behavior During Therapy: WFL for tasks assessed/performed Overall Cognitive Status: Within Functional Limits for tasks assessed                                          Exercises      Shoulder Instructions  General Comments      Pertinent Vitals/ Pain       Pain Assessment Pain Assessment: 0-10 Pain Score: 10-Worst pain ever Pain Location: Back Pain Descriptors / Indicators: Aching  Home Living                                          Prior Functioning/Environment              Frequency  Min 2X/week        Progress Toward Goals  OT Goals(current goals can now be found in the care plan section)  Progress towards OT goals: Progressing toward goals  Acute Rehab OT Goals Patient Stated Goal: To return home OT Goal Formulation: With patient Time For Goal Achievement: 04/16/22 Potential to Achieve Goals: Good  Plan Discharge plan remains appropriate    Co-evaluation                 AM-PAC OT "6 Clicks" Daily Activity     Outcome Measure   Help from another person eating meals?: None Help from another person taking care of personal grooming?: None Help  from another person toileting, which includes using toliet, bedpan, or urinal?: A Little Help from another person bathing (including washing, rinsing, drying)?: A Little Help from another person to put on and taking off regular upper body clothing?: None Help from another person to put on and taking off regular lower body clothing?: A Little 6 Click Score: 21    End of Session    OT Visit Diagnosis: Other abnormalities of gait and mobility (R26.89);Pain   Activity Tolerance Patient limited by pain   Patient Left in bed;with call bell/phone within reach   Nurse Communication          Time: 2563-8937 OT Time Calculation (min): 15 min  Charges: OT General Charges $OT Visit: 1 Visit OT Treatments $Self Care/Home Management : 8-22 mins  {Isai Gottlieb, MS, OTR/L   Harrel Carina 03/30/2022, 11:48 AM

## 2022-03-30 NOTE — Inpatient Diabetes Management (Signed)
Inpatient Diabetes Program Recommendations  AACE/ADA: New Consensus Statement on Inpatient Glycemic Control (2015)  Target Ranges:  Prepandial:   less than 140 mg/dL      Peak postprandial:   less than 180 mg/dL (1-2 hours)      Critically ill patients:  140 - 180 mg/dL   Lab Results  Component Value Date   GLUCAP 261 (H) 03/30/2022   HGBA1C 7.6 (H) 03/22/2022    Review of Glycemic Control  Latest Reference Range & Units 03/29/22 07:46 03/29/22 12:02 03/29/22 15:11 03/29/22 21:08 03/30/22 07:25 03/30/22 11:45  Glucose-Capillary 70 - 99 mg/dL 169 (H) 234 (H) 286 (H) 234 (H) 128 (H) 261 (H)  (H): Data is abnormally high  Current orders for Inpatient glycemic control: Novolog 0-9 units TID and 0-5 units QHS, Farxiga 10 mg QD  Inpatient Diabetes Program Recommendations:    Postprandials elevated.  Please consider:  Novolog 3 units TID with meals.  Will continue to follow while inpatient.  Thank you, Reche Dixon, MSN, [facility] Coordinator Inpatient Diabetes Program [phone] (team pager from 8a-5p)

## 2022-03-30 NOTE — Progress Notes (Signed)
Transesophageal Echocardiogram :  Indication: Mitral valve regurgitation Requesting/ordering  physician: arida,m  Procedure: Benzocaine spray x2 and 2 mls x 2 of viscous lidocaine were given orally to provide local anesthesia to the oropharynx. The patient was positioned supine on the left side, bite block provided. The patient was moderately sedated with the doses of versed and fentanyl as detailed below.  Using digital technique an omniplane probe was advanced into the distal esophagus without incident.   Moderate sedation: 1. Sedation used:  Versed: 4 milligram, Fentanyl: 100 ug Total sedation time 25 minutes 2. Time administered:   7:40 a.m. time when patient started recovery: 8:05 AM 3. I was face to face during this time  See report in EPIC  for complete details: In brief,  Multi jet moderate mitral valve regurgitation, otherwise well-functioning valve with no significant prolapse or calcification Mild, possibly mild to moderate tricuspid valve regurgitation No significant aortic valve regurgitation Trivial pulmonic valve regurgitation  transgastric imaging revealed normal LV function with no RWMAs and no mural apical thrombus.  .  Estimated ejection fraction was 55%.  Right sided cardiac chambers were normal with no evidence of pulmonary hypertension. Normal RV function  Imaging of the septum showed no ASD or VSD Bubble study was negative for shunt 2D and color flow confirmed no PFO  The LA was well visualized in orthogonal views.  There was no spontaneous contrast and no thrombus in the LA and LA appendage  Moderately dilated left atrium  The descending thoracic aorta had mild aortic atherosclerosis with no evidence of aneurysmal dilation or disection   Ida Rogue 03/30/2022 8:11 AM

## 2022-03-30 NOTE — Sedation Documentation (Signed)
MD aware of BP, orders for more meds, see MAR

## 2022-03-31 ENCOUNTER — Encounter: Payer: Self-pay | Admitting: Cardiology

## 2022-03-31 DIAGNOSIS — I5033 Acute on chronic diastolic (congestive) heart failure: Secondary | ICD-10-CM | POA: Diagnosis not present

## 2022-03-31 DIAGNOSIS — I482 Chronic atrial fibrillation, unspecified: Secondary | ICD-10-CM | POA: Diagnosis not present

## 2022-03-31 DIAGNOSIS — I248 Other forms of acute ischemic heart disease: Secondary | ICD-10-CM | POA: Diagnosis not present

## 2022-03-31 DIAGNOSIS — I5041 Acute combined systolic (congestive) and diastolic (congestive) heart failure: Secondary | ICD-10-CM | POA: Diagnosis not present

## 2022-03-31 DIAGNOSIS — I4821 Permanent atrial fibrillation: Secondary | ICD-10-CM | POA: Diagnosis not present

## 2022-03-31 DIAGNOSIS — D508 Other iron deficiency anemias: Secondary | ICD-10-CM | POA: Diagnosis not present

## 2022-03-31 LAB — GLUCOSE, CAPILLARY
Glucose-Capillary: 172 mg/dL — ABNORMAL HIGH (ref 70–99)
Glucose-Capillary: 197 mg/dL — ABNORMAL HIGH (ref 70–99)

## 2022-03-31 LAB — BASIC METABOLIC PANEL
Anion gap: 10 (ref 5–15)
BUN: 21 mg/dL (ref 8–23)
CO2: 30 mmol/L (ref 22–32)
Calcium: 8.7 mg/dL — ABNORMAL LOW (ref 8.9–10.3)
Chloride: 99 mmol/L (ref 98–111)
Creatinine, Ser: 1.26 mg/dL — ABNORMAL HIGH (ref 0.61–1.24)
GFR, Estimated: 58 mL/min — ABNORMAL LOW (ref >60)
Glucose, Bld: 153 mg/dL — ABNORMAL HIGH (ref 70–99)
Potassium: 3.6 mmol/L (ref 3.5–5.1)
Sodium: 139 mmol/L (ref 135–145)

## 2022-03-31 LAB — CBC
HCT: 34 % — ABNORMAL LOW (ref 39.0–52.0)
Hemoglobin: 11.1 g/dL — ABNORMAL LOW (ref 13.0–17.0)
MCH: 29.8 pg (ref 26.0–34.0)
MCHC: 32.6 g/dL (ref 30.0–36.0)
MCV: 91.4 fL (ref 80.0–100.0)
Platelets: 92 10*3/uL — ABNORMAL LOW (ref 150–400)
RBC: 3.72 MIL/uL — ABNORMAL LOW (ref 4.22–5.81)
RDW: 20.1 % — ABNORMAL HIGH (ref 11.5–15.5)
WBC: 11.4 10*3/uL — ABNORMAL HIGH (ref 4.0–10.5)
nRBC: 0 % (ref 0.0–0.2)

## 2022-03-31 MED ORDER — GLIMEPIRIDE 4 MG PO TABS: 4.0000 mg | ORAL_TABLET | Freq: Two times a day (BID) | ORAL | Status: AC

## 2022-03-31 MED ORDER — TORSEMIDE 40 MG PO TABS: 40.0000 mg | ORAL_TABLET | Freq: Two times a day (BID) | ORAL | 0 refills | Status: DC

## 2022-03-31 MED ORDER — DAPAGLIFLOZIN PROPANEDIOL 10 MG PO TABS: 10.0000 mg | ORAL_TABLET | Freq: Every day | ORAL | 0 refills | Status: AC

## 2022-03-31 MED ORDER — TORSEMIDE 20 MG PO TABS: 40.0000 mg | ORAL_TABLET | Freq: Two times a day (BID) | ORAL | Status: DC

## 2022-03-31 MED ORDER — SPIRONOLACTONE 25 MG PO TABS
12.5000 mg | ORAL_TABLET | Freq: Every day | ORAL | Status: DC
  Administered 2022-03-31: 12.5 mg via ORAL
  Filled 2022-03-31: qty 0.5
  Filled 2022-03-31: qty 1

## 2022-03-31 MED ORDER — METOPROLOL SUCCINATE ER 100 MG PO TB24: 100.0000 mg | ORAL_TABLET | Freq: Every day | ORAL | 0 refills | Status: DC

## 2022-03-31 MED ORDER — SPIRONOLACTONE 25 MG PO TABS: 12.5000 mg | ORAL_TABLET | Freq: Every day | ORAL | 0 refills | Status: AC

## 2022-03-31 MED ORDER — INSULIN ASPART 100 UNIT/ML IJ SOLN
3.0000 [IU] | Freq: Three times a day (TID) | INTRAMUSCULAR | Status: DC
  Administered 2022-03-31: 3 [IU] via SUBCUTANEOUS
  Filled 2022-03-31: qty 1

## 2022-03-31 NOTE — Inpatient Diabetes Management (Signed)
Inpatient Diabetes Program Recommendations  AACE/ADA: New Consensus Statement on Inpatient Glycemic Control (2015)  Target Ranges:  Prepandial:   less than 140 mg/dL      Peak postprandial:   less than 180 mg/dL (1-2 hours)      Critically ill patients:  140 - 180 mg/dL   Lab Results  Component Value Date   GLUCAP 172 (H) 03/31/2022   HGBA1C 7.6 (H) 03/22/2022    Review of Glycemic Control  Latest Reference Range & Units 03/30/22 07:25 03/30/22 11:45 03/30/22 16:54 03/30/22 21:01 03/31/22 07:27  Glucose-Capillary 70 - 99 mg/dL 128 (H) 261 (H) 263 (H) 197 (H) 172 (H)  (H): Data is abnormally high  Current orders for Inpatient glycemic control: Novolog 0-9 units TID and 0-5 units QHS, Farxiga 10 mg QD   Inpatient Diabetes Program Recommendations:     Postprandials elevated.  Please consider:   Novolog 3 units TID with meals.   Will continue to follow while inpatient.   Thank you, Reche Dixon, MSN, [place] [facility] Coordinator Inpatient Diabetes Program [phone] (team pager from 8a-5p)

## 2022-03-31 NOTE — Discharge Summary (Addendum)
Physician Discharge Summary  Cody Price GUR:[id number] DOB: [birth year] DOA: 03/22/2022  PCP: Baxter Hire, MD  Admit date: 03/22/2022 Discharge date: 03/31/2022  Admitted From: home  Disposition:  home w/ home health   Recommendations for Outpatient Follow-up:  Follow up with PCP in 1-2 weeks F/u w/ cardio, Christell Faith, on 04/06/22 at [place]: yes Equipment/Devices:  Discharge Condition: stable  CODE STATUS: full  Diet recommendation: Heart Healthy / Carb Modified   Brief/Interim Summary: HPI was taken from Dr. Blaine Hamper: Cody Price is a 81 y.o. male with medical history significant of CAD with stent, hypertension, hyperlipidemia, diabetes mellitus, atrial fibrillation and PE on Eliquis, BPH, kidney stone, thrombocytopenia, ulcer disease, dCHF, who presents with chest pain.   Patient states that he has intermittent chest pain for almost 3 days, which is located in the front chest, pressure-like, sometimes 10 out of 10 severity, currently mild, pressure-like, nonradiating, aggravated by exertion and movement.  Associated with shortness breath which is also exertional.  Patient does not have cough, fever or chills.  No nausea, vomiting, diarrhea or abdominal pain.  No symptoms of UTI.  Patient's hemoglobin dropped from 12.7 on 01/07/22 to 8.1 today. He denies rectal bleeding or bleeding from anywhere.  Patient states that he is drinking some juice which make his stool dark.   Data reviewed independently and ED Course: pt was found to have WBC 9.  4, troponin levels 69, 72, GFR> 60, temperature normal, blood pressure 109/56, heart rate 89, RR 20, oxygen saturation 100% on room air.  Chest X-.  Patient is admitted to telemetry bed as inpatient.  Dr. Fletcher Anon of card is consulted.     EKG: I have personally reviewed.  Atrial fibrillation, QTc 452, low voltage, anteroseptal infarction pattern, PVC, poor R wave progression  As per Dr. Mal Misty: Cody Price is a 81 y.o. male with  medical history significant of CAD with stent, hypertension, hyperlipidemia, diabetes mellitus, atrial fibrillation and PE on Eliquis, BPH, kidney stone, thrombocytopenia, peptic ulcer disease, chronic diastolic CHF, who presented to the hospital with intermittent chest pain, exertional shortness of breath.  He also reported dark stools which she attributed to some juice.   He was admitted to the hospital for acute on chronic systolic and diastolic CHF.  He was treated with IV Lasix.  Elevated troponins were attributed to demand ischemia.  He was transfused with 3 units of PRBCs for symptomatic anemia.  He was also given IV iron infusion for iron deficiency anemia.  He underwent EGD which showed a single gastric polyp.  There was no evidence of bleeding.  Colonoscopy was not performed because of concerns that patient dropped his blood pressure requiring Neo-Synephrine drip.  EGD. He developed atrial fibrillation with RVR requiring IV Cardizem bolus.  Metoprolol XL has been increased from 50 to 100 mg daily.  As per Dr. Jimmye Norman 03/31/22: Pt was medically stable for d/c as per cardio & hospitalist services. Pt was d/c home on metoprolol, torsemide, aldactone, aspirin, eliquis, farxiga, & lisinopril. PT/OT evaluated pt and recommended home health. Home health was set up by CM prior to d/c. For more information, please see previous progress/consult notes.    Discharge Diagnoses:  Principal Problem:   Chest pain Active Problems:   CAD (coronary artery disease)   Chronic diastolic CHF (congestive heart failure) (HCC)   Atrial fibrillation, chronic (HCC)   Hx of pulmonary embolus   Diabetes mellitus without complication (HCC)   Thrombocytopenia ([place])  BPH with obstruction/lower urinary tract symptoms   Normocytic anemia   Hypertension   HLD (hyperlipidemia)   Anxiety   Acute on chronic heart failure with preserved ejection fraction (HCC)   Acute combined systolic and diastolic heart failure (HCC)    Demand ischemia (HCC) IDA: s/p 3 units of pRBCs transfused. S/p IV iron,. overt GI bleeding.  S/p EGD on 03/24/2022 which showed single gastric polyp.  No plan for colonoscopy per GI    CAD: with PCI to LAD. W/ elevated tropoins. No NSTEMI per cardiologist.  S/p left and right heart cath on 03/26/2022.  This revealed significant two-vessel CAD with patent LAD stents with minimal restenosis. Has chronically occluded  RCA with left to right collaterals.    Chronic a. fib: w/ RVR. Continue on metoprolol, eliquis    DM2: HbA1c 7.6, poorly controlled. Holding home dose of glimepiride and started on farxiga as per cardio    Hypokalemia: WNL   Hypotension: resolved. Keep MAP >65  Acute on chronic combined CHF: s/p TEE on 03/30/2022 which showed EF estimated at 68 to 60%, moderate mitral regurgitation, mild to moderate tricuspid regurgitation mild (grade 2) atheroma plaque involving the aortic arch and descending aorta, no evidence of atrial thrombus. D/c lasix and continue on increased dose of metoprolol.  Started on torsemide, aldactone & farixga as per cardio. Monitor I/Os   History of pulmonary embolism: continue on eliquis   Chronic thrombocytopenia: labile   Generalized weakness: PT/OT recs HH   Discharge Instructions  Discharge Instructions     Diet - low sodium heart healthy   Complete by: As directed    Diet Carb Modified   Complete by: As directed    Discharge instructions   Complete by: As directed    F/u w/ PCP in 1-2 weeks. F/u w/ cardio, Christell Faith, on 04/06/22 at 11:20AM   Increase activity slowly   Complete by: As directed       Allergies as of 03/31/2022       Reactions   Novocain [procaine] Other (See Comments)   Syncope   Procaine Hcl Other (See Comments)   Passes out   Lyrica [pregabalin]    Neurontin [gabapentin]    Amitriptyline Other (See Comments)   Sleepy   Dicyclomine Other (See Comments)   Other reaction(s): Abdominal Pain   Metformin Nausea Only    Nitroglycerin Other (See Comments)   Makes Blood pressure go to low.         Medication List     STOP taking these medications    furosemide 40 MG tablet Commonly known as: LASIX       TAKE these medications    acetaminophen 500 MG tablet Commonly known as: TYLENOL Take 500 mg by mouth as needed.   aspirin EC 81 MG tablet Take 1 tablet (81 mg total) by mouth daily. Swallow whole.   atorvastatin 40 MG tablet Commonly known as: LIPITOR TAKE 1 TABLET BY MOUTH DAILY AT 6 PM.   Co Q-10 200 MG Caps Take 200 mg by mouth daily.   cyanocobalamin 1000 MCG/ML injection Commonly known as: (VITAMIN B-12) Inject 1,000 mcg into the muscle every 30 (thirty) days.   dapagliflozin propanediol 10 MG Tabs tablet Commonly known as: FARXIGA Take 1 tablet (10 mg total) by mouth daily. Start taking on: April 01, 2022   diazepam 5 MG tablet Commonly known as: VALIUM Take 5 mg by mouth 3 (three) times daily as needed for anxiety or muscle spasms.  Eliquis 5 MG Tabs tablet Generic drug: apixaban TAKE 1 TABLET BY MOUTH TWICE A DAY   finasteride 5 MG tablet Commonly known as: PROSCAR TAKE 1 TABLET BY MOUTH EVERY DAY   fluticasone 50 MCG/ACT nasal spray Commonly known as: FLONASE Place 2 sprays into both nostrils daily.   glimepiride 4 MG tablet Commonly known as: AMARYL Take 1 tablet (4 mg total) by mouth 2 (two) times daily. Hold this medication until you see your PCP as you were started on farxiga as per cardio What changed: additional instructions   Klor-Con M20 20 MEQ tablet Generic drug: potassium chloride SA Take 20 mEq by mouth daily.   lisinopril 10 MG tablet Commonly known as: ZESTRIL Take 10 mg by mouth daily. What changed: Another medication with the same name was removed. Continue taking this medication, and follow the directions you see here.   methocarbamol 500 MG tablet Commonly known as: Robaxin Take 1 tablet (500 mg total) by mouth every 8 (eight) hours  as needed for muscle spasms.   metoprolol succinate 100 MG 24 hr tablet Commonly known as: TOPROL-XL Take 1 tablet (100 mg total) by mouth daily. Take with or immediately following a meal. Start taking on: April 01, 2022 What changed:  medication strength See the new instructions.   multivitamin with minerals Tabs tablet Take 1 tablet by mouth daily.   omega-3 acid ethyl esters 1 g capsule Commonly known as: LOVAZA Take 2 g by mouth daily.   omeprazole 40 MG capsule Commonly known as: PRILOSEC Take 40 mg by mouth 2 (two) times daily.   oxyCODONE 5 MG immediate release tablet Commonly known as: Oxy IR/ROXICODONE Take 5 mg by mouth 4 (four) times daily as needed. What changed: Another medication with the same name was removed. Continue taking this medication, and follow the directions you see here.   oxyCODONE-acetaminophen 10-325 MG tablet Commonly known as: PERCOCET Take 1 tablet by mouth every 4 (four) hours as needed for pain.   potassium chloride 10 MEQ tablet Commonly known as: KLOR-CON Take 10 mEq by mouth daily.   sildenafil 100 MG tablet Commonly known as: VIAGRA Take 100 mg by mouth daily as needed for erectile dysfunction.   spironolactone 25 MG tablet Commonly known as: ALDACTONE Take 0.5 tablets (12.5 mg total) by mouth daily.   sucralfate 1 g tablet Commonly known as: CARAFATE Take 1 g by mouth 4 (four) times daily.   tamsulosin 0.4 MG Caps capsule Commonly known as: FLOMAX TAKE 1 CAPSULE BY MOUTH EVERY DAY   testosterone cypionate 200 MG/ML injection Commonly known as: DEPOTESTOSTERONE CYPIONATE Inject 200 mg into the muscle every 28 (twenty-eight) days.   Torsemide 40 MG Tabs Take 40 mg by mouth 2 (two) times daily.        Follow-up Information     Rise Mu, PA-C Follow up on 04/06/2022.   Specialties: Physician Assistant, Cardiology, Radiology Why: 11:20 AM Contact information: [place]  [phone]                Allergies  Allergen Reactions   Novocain [Procaine] Other (See Comments)    Syncope   Procaine Hcl Other (See Comments)    Passes out   Lyrica [Pregabalin]    Neurontin [Gabapentin]    Amitriptyline Other (See Comments)    Sleepy   Dicyclomine Other (See Comments)    Other reaction(s): Abdominal Pain   Metformin Nausea Only   Nitroglycerin Other (See  Comments)    Makes Blood pressure go to low.     Consultations: Cardio GI    Procedures/Studies: ECHO TEE  Result Date: 03/30/2022    TRANSESOPHOGEAL ECHO REPORT   Patient Name:   Cody Price Date of Exam: 03/30/2022 Medical Rec #:  [id number]      Height:       74.0 in Accession #:    [id number]     Weight:       231.3 lb Date of Birth:  [birth year]       BSA:          2.312 m Patient Age:    30 years       BP:           92/56 mmHg Patient Gender: M              HR:           45 bpm. Exam Location:  [facility] Procedure: Transesophageal Echo, Cardiac Doppler, Color Doppler and Saline            Contrast Bubble Study Indications:     Mitral Regurgitation  History:         Patient has prior history of Echocardiogram examinations, most                  recent 03/23/2022. CAD and Previous Myocardial Infarction; Risk                  Factors:Diabetes.  Sonographer:     Sherrie Sport Referring Phys:  [place] Diagnosing Phys: Ida Rogue MD PROCEDURE: TEE procedure time was 25 minutes. The transesophogeal probe was passed without difficulty through the esophogus of the patient. Imaged were obtained with the patient in a left lateral decubitus position. Local oropharyngeal anesthetic was provided with viscous lidocaine and Cetacaine. Sedation performed by performing physician. Patients was under conscious sedation during this procedure. Anesthetic administered: 154mg of Fentanyl, 4.'0mg'$  of Versed. Image quality was excellent. The patient's vital signs; including heart rate, blood pressure, and oxygen  saturation; remained stable throughout the procedure. The patient developed no complications during the procedure. IMPRESSIONS  1. Left ventricular ejection fraction, by estimation, is 55 to 60%. The left ventricle has normal function. The left ventricle has no regional wall motion abnormalities.  2. Right ventricular systolic function is normal. The right ventricular size is normal.  3. Left atrial size was moderately dilated. No left atrial/left atrial appendage thrombus was detected.  4. The mitral valve is normal in structure. Moderate mitral valve regurgitation, multiple jets noted. No evidence of mitral stenosis.  5. Tricuspid valve regurgitation is mild to moderate.  6. The aortic valve is normal in structure. Aortic valve regurgitation is not visualized. No aortic stenosis is present.  7. There is mild (Grade II) atheroma plaque involving the aortic arch and descending aorta.  8. The inferior vena cava is normal in size with greater than 50% respiratory variability, suggesting right atrial pressure of 3 mmHg.  9. Agitated saline contrast bubble study was negative, with no evidence of any interatrial shunt. Conclusion(s)/Recommendation(s): Normal biventricular function without evidence of hemodynamically significant valvular heart disease. FINDINGS  Left Ventricle: Left ventricular ejection fraction, by estimation, is 55 to 60%. The left ventricle has normal function. The left ventricle has no regional wall motion abnormalities. The left ventricular internal cavity size was normal in size. There is  no left ventricular hypertrophy. Right Ventricle: The right ventricular size is normal. No  increase in right ventricular wall thickness. Right ventricular systolic function is normal. Left Atrium: Left atrial size was moderately dilated. No left atrial/left atrial appendage thrombus was detected. Right Atrium: Right atrial size was normal in size. Pericardium: There is no evidence of pericardial effusion. Mitral  Valve: The mitral valve is normal in structure. Moderate mitral valve regurgitation. No evidence of mitral valve stenosis. Tricuspid Valve: The tricuspid valve is normal in structure. Tricuspid valve regurgitation is mild to moderate. No evidence of tricuspid stenosis. Aortic Valve: The aortic valve is normal in structure. Aortic valve regurgitation is not visualized. No aortic stenosis is present. Pulmonic Valve: The pulmonic valve was normal in structure. Pulmonic valve regurgitation is not visualized. No evidence of pulmonic stenosis. Aorta: The aortic root is normal in size and structure. There is mild (Grade II) atheroma plaque involving the aortic arch and descending aorta. Venous: The inferior vena cava is normal in size with greater than 50% respiratory variability, suggesting right atrial pressure of 3 mmHg. IAS/Shunts: No atrial level shunt detected by color flow Doppler. Agitated saline contrast was given intravenously to evaluate for intracardiac shunting. Agitated saline contrast bubble study was negative, with no evidence of any interatrial shunt. Ida Rogue MD Electronically signed by Ida Rogue MD Signature Date/Time: 03/30/2022/12:51:45 PM    Final    CARDIAC CATHETERIZATION  Result Date: 03/26/2022   Colon Flattery LM lesion is 20% stenosed.   Dist LAD lesion is 80% stenosed.   Prox LAD-1 lesion is 10% stenosed.   Mid LAD lesion is 10% stenosed.   Prox Cx lesion is 50% stenosed.   Dist RCA lesion is 100% stenosed.   Ost 1st Diag lesion is 90% stenosed.   Ost 3rd Mrg to 3rd Mrg lesion is 50% stenosed.   Prox RCA lesion is 100% stenosed.   Non-stenotic Prox LAD-2 lesion was previously treated.   There is moderate left ventricular systolic dysfunction.   LV end diastolic pressure is mildly elevated.   The left ventricular ejection fraction is 35-45% by visual estimate. 1.  Significant two-vessel coronary artery disease with patent LAD stents with minimal restenosis.  Chronically occluded right  coronary artery with left-to-right collaterals.  Moderate left circumflex disease.  No significant change overall since most recent cardiac catheterization.  The coronary arteries are moderately calcified and diffusely diseased. 2.  Moderately reduced LV systolic function with an EF of 35 to 40%. 3.  Right heart catheterization showed moderately elevated filling pressures, mild pulmonary hypertension and normal cardiac output.  Pulmonary wedge pressure was 25 mmHg with very prominent V waves suggestive of significant mitral regurgitation. Recommendations: The patient is still volume overloaded.  Continue diuresis. Recommend optimization of systolic heart failure. Mitral regurgitation might be underestimated by echocardiogram.  Consider transesophageal echocardiogram once his heart failure is optimized.  ECHOCARDIOGRAM COMPLETE  Result Date: 03/23/2022    ECHOCARDIOGRAM REPORT   Patient Name:   Cody Price Date of Exam: 03/23/2022 Medical Rec #:  [id number]      Height:       74.0 in Accession #:    [id number]     Weight:       246.5 lb Date of Birth:  [birth year]       BSA:          2.375 m Patient Age:    53 years       BP:           105/57 mmHg Patient Gender: M  HR:           83 bpm. Exam Location:  [facility] Procedure: 2D Echo, Cardiac Doppler, Color Doppler and Intracardiac            Opacification Agent Indications:     Elevated troponin  History:         Patient has prior history of Echocardiogram examinations, most                  recent 08/24/2021. CHF, CAD and Previous Myocardial Infarction,                  Coronary stent, Arrythmias:Atrial Fibrillation; Risk                  Factors:Former Smoker, Diabetes, Hypertension and Dyslipidemia.  Sonographer:     Rosalia Hammers Referring Phys:  [id number] AJOINOMV A ARIDA Diagnosing Phys: Nelva Bush MD  Sonographer Comments: Image acquisition challenging due to patient body habitus. IMPRESSIONS  1. Left ventricular ejection fraction, by estimation, is  50 to 55%. The left ventricle has low normal function. The left ventricle demonstrates regional wall motion abnormalities (see scoring diagram/findings for description). There is moderate left ventricular hypertrophy. Left ventricular diastolic function could not be evaluated. There is mild hypokinesis of the left ventricular, basal inferior and inferolateral segments.  2. Right ventricular systolic function is normal. The right ventricular size is mildly enlarged. There is moderately elevated pulmonary artery systolic pressure. The estimated right ventricular systolic pressure is 67.2 mmHg.  3. Left atrial size was mildly dilated.  4. Right atrial size was mildly dilated.  5. The mitral valve is abnormal. Mild to moderate mitral valve regurgitation.  6. Tricuspid valve regurgitation is mild to moderate.  7. The aortic valve is tricuspid. There is mild thickening of the aortic valve. Aortic valve regurgitation is not visualized. Aortic valve sclerosis is present, with no evidence of aortic valve stenosis.  8. Aortic dilatation noted. There is borderline dilatation of the aortic root and of the ascending aorta, measuring 38 mm.  9. The inferior vena cava is dilated in size with <50% respiratory variability, suggesting right atrial pressure of 15 mmHg. FINDINGS  Left Ventricle: Left ventricular ejection fraction, by estimation, is 50 to 55%. The left ventricle has low normal function. The left ventricle demonstrates regional wall motion abnormalities. Mild hypokinesis of the left ventricular, basal inferior and  inferolateral segments. Definity contrast agent was given IV to delineate the left ventricular endocardial borders. The left ventricular internal cavity size was normal in size. There is moderate left ventricular hypertrophy. Left ventricular diastolic function could not be evaluated due to atrial fibrillation. Left ventricular diastolic function could not be evaluated. Right Ventricle: The right ventricular  size is mildly enlarged. No increase in right ventricular wall thickness. Right ventricular systolic function is normal. There is moderately elevated pulmonary artery systolic pressure. The tricuspid regurgitant velocity is 3.12 m/s, and with an assumed right atrial pressure of 15 mmHg, the estimated right ventricular systolic pressure is 09.4 mmHg. Left Atrium: Left atrial size was mildly dilated. Right Atrium: Right atrial size was mildly dilated. Pericardium: There is no evidence of pericardial effusion. Mitral Valve: The mitral valve is abnormal. There is mild thickening of the mitral valve leaflet(s). Mild to moderate mitral valve regurgitation. Tricuspid Valve: The tricuspid valve is normal in structure. Tricuspid valve regurgitation is mild to moderate. Aortic Valve: The aortic valve is tricuspid. There is mild thickening of the aortic valve. There is mild aortic valve  annular calcification. Aortic valve regurgitation is not visualized. Aortic valve sclerosis is present, with no evidence of aortic valve  stenosis. Aortic valve mean gradient measures 4.0 mmHg. Aortic valve peak gradient measures 8.3 mmHg. Aortic valve area, by VTI measures 2.12 cm. Pulmonic Valve: The pulmonic valve was normal in structure. Pulmonic valve regurgitation is mild. Aorta: Aortic dilatation noted. There is borderline dilatation of the aortic root and of the ascending aorta, measuring 38 mm. Pulmonary Artery: The pulmonary artery is of normal size. Venous: The inferior vena cava is dilated in size with less than 50% respiratory variability, suggesting right atrial pressure of 15 mmHg. IAS/Shunts: No atrial level shunt detected by color flow Doppler.  LEFT VENTRICLE PLAX 2D LVIDd:         4.76 cm LVIDs:         4.03 cm LV PW:         1.51 cm LV IVS:        1.64 cm LVOT diam:     1.90 cm LV SV:         58 LV SV Index:   25 LVOT Area:     2.84 cm  RIGHT VENTRICLE RV Basal diam:  5.18 cm TAPSE (M-mode): 2.5 cm LEFT ATRIUM               Index        RIGHT ATRIUM           Index LA diam:        4.30 cm  1.81 cm/m   RA Area:     23.50 cm LA Vol (A2C):   111.0 ml 46.73 ml/m  RA Volume:   73.10 ml  30.78 ml/m LA Vol (A4C):   70.2 ml  29.55 ml/m LA Biplane Vol: 88.3 ml  37.17 ml/m  AORTIC VALVE AV Area (Vmax):    1.95 cm AV Area (Vmean):   1.81 cm AV Area (VTI):     2.12 cm AV Vmax:           144.00 cm/s AV Vmean:          96.800 cm/s AV VTI:            0.275 m AV Peak Grad:      8.3 mmHg AV Mean Grad:      4.0 mmHg LVOT Vmax:         99.00 cm/s LVOT Vmean:        61.900 cm/s LVOT VTI:          0.206 m LVOT/AV VTI ratio: 0.75  AORTA Ao Root diam: 3.80 cm MITRAL VALVE                TRICUSPID VALVE MV Area (PHT): 2.83 cm     TR Peak grad:   38.9 mmHg MV Decel Time: 268 msec     TR Vmax:        312.00 cm/s MV E velocity: 114.00 cm/s                             SHUNTS                             Systemic VTI:  0.21 m                             Systemic Diam:  1.90 cm Nelva Bush MD Electronically signed by Nelva Bush MD Signature Date/Time: 03/23/2022/4:26:17 PM    Final    US Abdomen Complete  Result Date: 03/22/2022 CLINICAL DATA:  Assess for cirrhosis and splenomegaly. EXAM: ABDOMEN ULTRASOUND COMPLETE COMPARISON:  Abdominopelvic CT 05/30/2019. FINDINGS: Gallbladder: Surgically absent. Common bile duct: Diameter: 7 mm, normal for cholecystectomy state. Liver: There may be subtle capsular nodularity of the left lobe. No focal lesion identified. Within normal limits in parenchymal echogenicity. Portal vein is patent on color Doppler imaging with normal direction of blood flow towards the liver. IVC: No abnormality visualized. Pancreas: The majority is obscured, visualized portion unremarkable. Spleen: Enlarged measuring 13.4 x 7.7 x 16.4 cm, volume is 885 cc. Splenic volume has decreased from prior abdominal CT. Right Kidney: Length: 12 cm. 2.4 cm cyst arises from the upper pole. This is benign and needs no further follow-up. Normal  parenchymal echogenicity. No hydronephrosis. No renal calculi. Left Kidney: Length: 12.3 cm. Normal parenchymal echogenicity. No hydronephrosis. No visualized stone or focal lesion. Abdominal aorta: No aneurysm visualized. The mid and distal aorta are obscured by overlying bowel gas. Other findings: No abdominal ascites. IMPRESSION: 1. Chronic splenomegaly, although splenic volume has diminished from 2020 CT. 2. Equivocal subtle capsular nodularity of the left lobe of the liver. There are no other findings of cirrhosis, no focal hepatic lesion. 3. Cholecystectomy without biliary dilatation. Electronically Signed   By: Keith Rake M.D.   On: 03/22/2022 17:43   DG Chest 2 View  Result Date: 03/22/2022 CLINICAL DATA:  Chest pain. EXAM: CHEST - 2 VIEW COMPARISON:  August 21, 2021. FINDINGS: Stable cardiomediastinal silhouette. No acute pulmonary disease is noted. Bony thorax is unremarkable. IMPRESSION: No active cardiopulmonary disease. Electronically Signed   By: Marijo Conception M.D.   On: 03/22/2022 10:40   (Echo, Carotid, EGD, Colonoscopy, ERCP)    Subjective: Pt c/o fatigue    Discharge Exam: Vitals:   03/31/22 0738 03/31/22 1148  BP: (!) 115/59 (!) 107/49  Pulse: 71 77  Resp: 16 16  Temp: 97.6 F (36.4 C) (!) 97.5 F (36.4 C)  SpO2: 96% 97%   Vitals:   03/31/22 0424 03/31/22 0543 03/31/22 0738 03/31/22 1148  BP: (!) 107/53  (!) 115/59 (!) 107/49  Pulse: 67  71 77  Resp: '20  16 16  '$ Temp: 97.8 F (36.6 C)  97.6 F (36.4 C) (!) 97.5 F (36.4 C)  TempSrc: Oral  Oral Oral  SpO2: 98%  96% 97%  Weight:  104.1 kg    Height:        General: Pt is alert, awake, not in acute distress Cardiovascular: irregularly irregular, no rubs, no gallops Respiratory: CTA bilaterally, no wheezing, no rhonchi Abdominal: Soft, NT, ND, bowel sounds + Extremities: no cyanosis    The results of significant diagnostics from this hospitalization (including imaging, microbiology, ancillary and  laboratory) are listed below for reference.     Microbiology: No results found for this or any previous visit (from the past 240 hour(s)).   Labs: BNP (last 3 results) Recent Labs    08/22/21 0500 03/22/22 1015  BNP 574.8* 009.2*   Basic Metabolic Panel: Recent Labs  Lab 03/25/22 0431 03/26/22 0438 03/27/22 1041 03/28/22 1211 03/29/22 0643 03/30/22 1114 03/31/22 0319  NA 137 138 137 137 141 137 139  K 3.2* 3.5 3.7 3.9 3.4* 3.4* 3.6  CL 108 110 105 102 105 99 99  CO2 '22 23 23 25 28 28 30  '$ GLUCOSE  121* 127* 235* 264* 136* 208* 153*  BUN '17 14 15 15 14 18 21  '$ CREATININE 0.99 0.84 0.89 1.08 0.99 1.22 1.26*  CALCIUM 8.0* 8.1* 8.3* 8.5* 8.8* 8.9 8.7*  MG 1.7 1.8  --   --  2.0  --   --    Liver Function Tests: No results for input(s): "AST", "ALT", "ALKPHOS", "BILITOT", "PROT", "ALBUMIN" in the last 168 hours. No results for input(s): "LIPASE", "AMYLASE" in the last 168 hours. No results for input(s): "AMMONIA" in the last 168 hours. CBC: Recent Labs  Lab 03/25/22 0431 03/25/22 1409 03/26/22 0438 03/27/22 1041 03/28/22 1211 03/31/22 1129  WBC 7.5  --  9.0 8.9 9.7 11.4*  HGB 7.5* 8.5* 8.8* 8.9* 9.6* 11.1*  HCT 22.2* 25.8* 26.1* 27.1* 29.5* 34.0*  MCV 88.1  --  88.8 89.7 89.7 91.4  PLT 62*  --  64* 75* 77* 92*   Cardiac Enzymes: No results for input(s): "CKTOTAL", "CKMB", "CKMBINDEX", "TROPONINI" in the last 168 hours. BNP: Invalid input(s): "POCBNP" CBG: Recent Labs  Lab 03/30/22 1145 03/30/22 1654 03/30/22 2101 03/31/22 0727 03/31/22 1132  GLUCAP 261* 263* 197* 172* 197*   D-Dimer No results for input(s): "DDIMER" in the last 72 hours. Hgb A1c No results for input(s): "HGBA1C" in the last 72 hours. Lipid Profile No results for input(s): "CHOL", "HDL", "LDLCALC", "TRIG", "CHOLHDL", "LDLDIRECT" in the last 72 hours. Thyroid function studies No results for input(s): "TSH", "T4TOTAL", "T3FREE", "THYROIDAB" in the last 72 hours.  Invalid input(s):  "FREET3" Anemia work up No results for input(s): "VITAMINB12", "FOLATE", "FERRITIN", "TIBC", "IRON", "RETICCTPCT" in the last 72 hours. Urinalysis    Component Value Date/Time   COLORURINE YELLOW (A) 03/24/2022 1045   APPEARANCEUR CLEAR (A) 03/24/2022 1045   APPEARANCEUR Clear 01/01/2016 1354   LABSPEC 1.008 03/24/2022 1045   LABSPEC 1.023 04/25/2014 0238   PHURINE 6.0 03/24/2022 1045   GLUCOSEU NEGATIVE 03/24/2022 1045   GLUCOSEU 50 mg/dL 04/25/2014 0238   HGBUR NEGATIVE 03/24/2022 1045   BILIRUBINUR NEGATIVE 03/24/2022 1045   BILIRUBINUR Negative 01/01/2016 1354   BILIRUBINUR Negative 04/25/2014 0238   KETONESUR NEGATIVE 03/24/2022 1045   PROTEINUR NEGATIVE 03/24/2022 1045   NITRITE NEGATIVE 03/24/2022 1045   LEUKOCYTESUR NEGATIVE 03/24/2022 1045   LEUKOCYTESUR Negative 04/25/2014 0238   Sepsis Labs Recent Labs  Lab 03/26/22 0438 03/27/22 1041 03/28/22 1211 03/31/22 1129  WBC 9.0 8.9 9.7 11.4*   Microbiology No results found for this or any previous visit (from the past 240 hour(s)).   Time coordinating discharge: Over 30 minutes  SIGNED:   Wyvonnia Dusky, MD  [facility] 03/31/2022, 1:23 PM Pager   If 7PM-7AM, please contact night-coverage [email or web address]

## 2022-03-31 NOTE — Progress Notes (Signed)
Physical Therapy Treatment Patient Details Name: Cody Price MRN: [id number] DOB: [birth year] Today's Date: 03/31/2022   History of Present Illness Cody Price is an 41yoM PMH CAD s/p stent, HTN, HLD, DM, Afib, PE on Eliquis, BPH, kidney stone, thrombocytopenia, PUD, dCHF, who presented to the hospital 03/22/22 with intermittent chest pain, exertional shortness of breath. No NSTEMI per cardiologist.  S/p left and right heart cath on 03/26/2022.    PT Comments    Pt in bed, agreeable to session. Reports minimal activity OOB to chair since 2 days prior, no AMB in room, says he'd be moving around a lot more if not for the chair/bed alarm. Author explains multiple falls hazards in room with tele leads, purewick suction tubing, equipment etc. Pt agreeable to trial AMB today, aware a RW will be needed given his continued unsteadiness from baseline, safe AMB out into hall and back once he's reached a sufficient amount of fatigue. Pt elects to go chair at end of session. Discussed HHPT again which he is agreeable to. He is looking forward to leaving to go home and taking a real shower.     Recommendations for follow up therapy are one component of a multi-disciplinary discharge planning process, led by the attending physician.  Recommendations may be updated based on patient status, additional functional criteria and insurance authorization.  Follow Up Recommendations  Home health PT     Assistance Recommended at Discharge Set up Supervision/Assistance  Patient can return home with the following A little help with walking and/or transfers;Assist for transportation   Equipment Recommendations       Recommendations for Other Services       Precautions / Restrictions Precautions Precautions: Fall Restrictions Weight Bearing Restrictions: No     Mobility  Bed Mobility Overal bed mobility: Needs Assistance Bed Mobility: Supine to Sit     Supine to sit: Supervision     General bed  mobility comments: moving well, but needs help managing lines/leads    Transfers Overall transfer level: Needs assistance Equipment used: None Transfers: Sit to/from Stand Sit to Stand: Modified independent (Device/Increase time)           General transfer comment: mod effort, no imbalance    Ambulation/Gait Ambulation/Gait assistance: Min guard, Supervision Gait Distance (Feet): 100 Feet Assistive device: Rolling walker (2 wheels) Gait Pattern/deviations: Step-to pattern       General Gait Details: safe, appropriate use of RW, good safety awareness and awareness of limitations, opts to turn back halfway to avoid excessive fatigue/exhaustion.   Stairs             Wheelchair Mobility    Modified Rankin (Stroke Patients Only)       Balance Overall balance assessment: History of Falls, Modified Independent, Mild deficits observed, not formally tested                                          Cognition Arousal/Alertness: Awake/alert Behavior During Therapy: WFL for tasks assessed/performed Overall Cognitive Status: Within Functional Limits for tasks assessed                                          Exercises      General Comments        Pertinent Vitals/Pain Pain Assessment Pain  Score:  (has pain all over- neck, back, legs, all chronic pain issues)    Home Living                          Prior Function            PT Goals (current goals can now be found in the care plan section) Acute Rehab PT Goals Patient Stated Goal: regain strength PT Goal Formulation: With patient Time For Goal Achievement: 04/12/22 Potential to Achieve Goals: Fair Progress towards PT goals: Progressing toward goals    Frequency    Min 2X/week      PT Plan Current plan remains appropriate    Co-evaluation              AM-PAC PT "6 Clicks" Mobility   Outcome Measure  Help needed turning from your back to  your side while in a flat bed without using bedrails?: None Help needed moving from lying on your back to sitting on the side of a flat bed without using bedrails?: None Help needed moving to and from a bed to a chair (including a wheelchair)?: None Help needed standing up from a chair using your arms (e.g., wheelchair or bedside chair)?: None Help needed to walk in hospital room?: A Little Help needed climbing 3-5 steps with a railing? : A Little 6 Click Score: 22    End of Session Equipment Utilized During Treatment: Gait belt Activity Tolerance: Patient tolerated treatment well Patient left: in chair;with call bell/phone within reach;with chair alarm set Nurse Communication: Mobility status PT Visit Diagnosis: Difficulty in walking, not elsewhere classified (R26.2);Unsteadiness on feet (R26.81);Other abnormalities of gait and mobility (R26.89);Muscle weakness (generalized) (M62.81)     Time: 8206-0156 PT Time Calculation (min) (ACUTE ONLY): 14 min  Charges:  $Therapeutic Exercise: 8-22 mins                    11:03 AM, 03/31/22 Etta Grandchild, PT, DPT Physical Therapist - [facility]  [phone] ([place])     Evoleth Nordmeyer C 03/31/2022, 11:01 AM

## 2022-03-31 NOTE — Progress Notes (Addendum)
Cardiology Progress Note   Patient Name: Cody Price Date of Encounter: 03/31/2022  Primary Cardiologist: Ida Rogue, MD  Subjective   Notes chronic, mild epigastric/lower chest discomfort, unchanged throughout admission.  Dyspnea improved.  Very eager to go home.  Inpatient Medications    Scheduled Meds:  apixaban  5 mg Oral BID   aspirin EC  81 mg Oral Daily   atorvastatin  40 mg Oral Daily   cyanocobalamin  1,000 mcg Intramuscular Q30 days   dapagliflozin propanediol  10 mg Oral Daily   finasteride  5 mg Oral Daily   insulin aspart  0-5 Units Subcutaneous QHS   insulin aspart  0-9 Units Subcutaneous TID WC   metoprolol succinate  100 mg Oral Daily   multivitamin with minerals  1 tablet Oral Daily   omega-3 acid ethyl esters  2 g Oral Daily   pantoprazole  40 mg Oral BID   polyethylene glycol  17 g Oral Daily   sodium chloride flush  3 mL Intravenous Q12H   spironolactone  12.5 mg Oral Daily   sucralfate  1 g Oral QID   tamsulosin  0.4 mg Oral Daily   torsemide  40 mg Oral BID   Continuous Infusions:  sodium chloride     sodium chloride     PRN Meds: sodium chloride, oxyCODONE **AND** acetaminophen, acetaminophen, bisacodyl, dextromethorphan-guaiFENesin, diazepam, hydrALAZINE, ipratropium-albuterol, melatonin, methocarbamol, morphine injection, ondansetron (ZOFRAN) IV, mouth rinse, sodium chloride flush   Vital Signs    Vitals:   03/31/22 0015 03/31/22 0424 03/31/22 0543 03/31/22 0738  BP: 102/62 (!) 107/53  (!) 115/59  Pulse: 63 67  71  Resp: '18 20  16  '$ Temp: 98.5 F (36.9 C) 97.8 F (36.6 C)  97.6 F (36.4 C)  TempSrc: Oral Oral  Oral  SpO2: 96% 98%  96%  Weight:   104.1 kg   Height:        Intake/Output Summary (Last 24 hours) at 03/31/2022 1009 Last data filed at 03/31/2022 1000 Gross per 24 hour  Intake 960 ml  Output 2650 ml  Net -1690 ml   Filed Weights   03/29/22 0508 03/30/22 0500 03/31/22 0543  Weight: 106.8 kg 104.9 kg 104.1 kg     Physical Exam   GEN: Well nourished, well developed, in no acute distress.  HEENT: Grossly normal.  Neck: Supple, no JVD, carotid bruits, or masses. Cardiac: Irregularly irregular, no murmurs, rubs, or gallops. No clubbing, cyanosis, edema.  Radials 2+, DP/PT 2+ and equal bilaterally.  Respiratory:  Respirations regular and unlabored, diminished breath sounds at the bases. GI: Soft, nontender, nondistended, BS + x 4. MS: no deformity or atrophy. Skin: warm and dry, no rash. Neuro:  Strength and sensation are intact. Psych: AAOx3.  Normal affect.  Labs    Chemistry Recent Labs  Lab 03/29/22 0643 03/30/22 1114 03/31/22 0319  NA 141 137 139  K 3.4* 3.4* 3.6  CL 105 99 99  CO2 '28 28 30  '$ GLUCOSE 136* 208* 153*  BUN '14 18 21  '$ CREATININE 0.99 1.22 1.26*  CALCIUM 8.8* 8.9 8.7*  GFRNONAA >60 60* 58*  ANIONGAP '8 10 10     '$ Hematology Recent Labs  Lab 03/26/22 0438 03/27/22 1041 03/28/22 1211  WBC 9.0 8.9 9.7  RBC 2.94* 3.02* 3.29*  HGB 8.8* 8.9* 9.6*  HCT 26.1* 27.1* 29.5*  MCV 88.8 89.7 89.7  MCH 29.9 29.5 29.2  MCHC 33.7 32.8 32.5  RDW 18.0* 18.6* 18.9*  PLT 64*  75* 77*    Cardiac Enzymes  Recent Labs  Lab 03/22/22 1021 03/22/22 1227 03/22/22 1430 03/22/22 1820 03/22/22 2252  TROPONINIHS 69* 72* 66* 62* 72*      BNP    Component Value Date/Time   BNP 431.0 (H) 03/22/2022 1015    Lipids  Lab Results  Component Value Date   CHOL 62 03/22/2022   HDL 20 (L) 03/22/2022   LDLCALC 27 03/22/2022   TRIG 75 03/22/2022   CHOLHDL 3.1 03/22/2022    HbA1c  Lab Results  Component Value Date   HGBA1C 7.6 (H) 03/22/2022    Radiology    -----------  Telemetry    Atrial fibrillation, 60s to 80s, occasional PVC/couplet- Personally Reviewed  Cardiac Studies   RHC/LHC 03/26/22: Diagnostic Dominance: Right    1.  Significant two-vessel coronary artery disease with patent LAD stents with minimal restenosis.  Chronically occluded right coronary  artery with left-to-right collaterals.  Moderate left circumflex disease.  No significant change overall since most recent cardiac catheterization.  The coronary arteries are moderately calcified and diffusely diseased. 2.  Moderately reduced LV systolic function with an EF of 35 to 40%. 3.  Right heart catheterization showed moderately elevated filling pressures, mild pulmonary hypertension and normal cardiac output.  Pulmonary wedge pressure was 25 mmHg with very prominent V waves suggestive of significant mitral regurgitation.   Recommendations: The patient is still volume overloaded.  Continue diuresis. Recommend optimization of systolic heart failure. Mitral regurgitation might be underestimated by echocardiogram.  Consider transesophageal echocardiogram once his heart failure is optimized.  _____________    Echo 03/23/22:  1. Left ventricular ejection fraction, by estimation, is 50 to 55%. The  left ventricle has low normal function. The left ventricle demonstrates  regional wall motion abnormalities (see scoring diagram/findings for  description). There is moderate left  ventricular hypertrophy. Left ventricular diastolic function could not be  evaluated. There is mild hypokinesis of the left ventricular, basal  inferior and inferolateral segments.   2. Right ventricular systolic function is normal. The right ventricular  size is mildly enlarged. There is moderately elevated pulmonary artery  systolic pressure. The estimated right ventricular systolic pressure is  01.7 mmHg.   3. Left atrial size was mildly dilated.   4. Right atrial size was mildly dilated.   5. The mitral valve is abnormal. Mild to moderate mitral valve  regurgitation.   6. Tricuspid valve regurgitation is mild to moderate.   7. The aortic valve is tricuspid. There is mild thickening of the aortic  valve. Aortic valve regurgitation is not visualized. Aortic valve  sclerosis is present, with no evidence of aortic  valve stenosis.   8. Aortic dilatation noted. There is borderline dilatation of the aortic  root and of the ascending aorta, measuring 38 mm.   9. The inferior vena cava is dilated in size with <50% respiratory  variability, suggesting right atrial pressure of 15 mmHg.  _____________   TEE 7.25.2023   1. Left ventricular ejection fraction, by estimation, is 55 to 60%. The  left ventricle has normal function. The left ventricle has no regional  wall motion abnormalities.   2. Right ventricular systolic function is normal. The right ventricular  size is normal.   3. Left atrial size was moderately dilated. No left atrial/left atrial  appendage thrombus was detected.   4. The mitral valve is normal in structure. Moderate mitral valve  regurgitation, multiple jets noted. No evidence of mitral stenosis.   5. Tricuspid  valve regurgitation is mild to moderate.   6. The aortic valve is normal in structure. Aortic valve regurgitation is  not visualized. No aortic stenosis is present.   7. There is mild (Grade II) atheroma plaque involving the aortic arch and  descending aorta.   8. The inferior vena cava is normal in size with greater than 50%  respiratory variability, suggesting right atrial pressure of 3 mmHg.   9. Agitated saline contrast bubble study was negative, with no evidence  of any interatrial shunt.  _____________   Patient Profile     81 y.o. male with obstructive coronary disease (chronically occluded RCA with left-to-right collaterals, moderate left circumflex disease, patent LAD stents, 80% distal LAD, 90% D1, 50% OM 3) diabetes, permanent atrial fibrillation, tobacco abuse, hypertension, and hyperlipidemia admitted with chest pain, anemia, and mildly elevated troponin.  Assessment & Plan    1.  Acute combined systolic and diastolic congestive heart failure: Patient admitted July 17 with chest pain, volume overload, and demand ischemia (troponin 72).  Low normal LV function  by echo this admission.  Right heart cath July 21 with elevated filling pressures and prominent V waves.  He has diuresed well and was -2.2 L yesterday and is -11.8 L since admission.  Weight has been stable at 104.1 kg, which is lower than previously recorded weights.  On examination, he appears euvolemic.  He was previously taking Lasix 40 mg twice daily at home.  In light of volume overload on admission, will change this to torsemide 40 mg twice daily.  Heart rate and blood pressure stable.  Continue Farxiga and beta-blocker.  Will add low-dose spironolactone.  Recommend outpatient follow-up in a week with basic metabolic panel at that time (sched to see Christell Faith on 8/1 @ 11:20a).  2.  Coronary artery disease/demand ischemia: Status post diagnostic catheterization this admission revealing patent LAD stents with apical LAD disease as well as a chronic total occlusion of the right coronary artery, and moderate circumflex disease.  He has chronic, low-level lower chest/epigastric discomfort which has not changed throughout admission.  Continue medical therapy including aspirin, statin, beta-blocker.  3.  Essential hypertension: Stable to soft.  Continue beta-blocker.  Adding low-dose spironolactone in the setting of #1.  Transitioning diuretics to p.o.  4.  Permanent atrial fibrillation: Rate controlled on beta-blocker therapy.  Eliquis resumed July 24.  5.  Hyperlipidemia: LDL 27 on July 17.  Continue atorvastatin.  6.  Acute on chronic anemia: Admitted with hemoglobin of 7.5 status post 1 unit of packed red blood cells.  EGD without evidence of bleeding.  No plan for colonoscopy.  Will need close outpatient follow-up in the setting of ongoing Eliquis therapy.  7.  Hypokalemia: Potassium stable at 3.6.  Adding low-dose spironolactone.  Will need follow-up basic metabolic panel at outpatient follow-up.  8.  Type 2 diabetes mellitus: A1c 7.6 on July 17.  Per internal medicine.  9.  Moderate mitral  regurgitation: Large V waves on right heart catheterization though only moderate MR by TEE.  We will plan to follow as outpatient.  Signed, Murray Hodgkins, NP  03/31/2022, 10:09 AM    For questions or updates, please contact   Please consult [email or web address] for contact info under Cardiology/STEMI.

## 2022-04-06 ENCOUNTER — Ambulatory Visit: Payer: Medicare HMO | Admitting: Physician Assistant

## 2022-04-08 ENCOUNTER — Ambulatory Visit (INDEPENDENT_AMBULATORY_CARE_PROVIDER_SITE_OTHER): Payer: Medicare HMO | Admitting: Medical

## 2022-04-08 ENCOUNTER — Encounter: Payer: Self-pay | Admitting: Medical

## 2022-04-08 VITALS — BP 78/52 | HR 82 | Ht 74.0 in | Wt 230.0 lb

## 2022-04-08 DIAGNOSIS — I5042 Chronic combined systolic (congestive) and diastolic (congestive) heart failure: Secondary | ICD-10-CM | POA: Diagnosis not present

## 2022-04-08 DIAGNOSIS — I4821 Permanent atrial fibrillation: Secondary | ICD-10-CM | POA: Diagnosis not present

## 2022-04-08 DIAGNOSIS — I2510 Atherosclerotic heart disease of native coronary artery without angina pectoris: Secondary | ICD-10-CM

## 2022-04-08 DIAGNOSIS — I10 Essential (primary) hypertension: Secondary | ICD-10-CM

## 2022-04-08 DIAGNOSIS — D649 Anemia, unspecified: Secondary | ICD-10-CM

## 2022-04-08 DIAGNOSIS — I25119 Atherosclerotic heart disease of native coronary artery with unspecified angina pectoris: Secondary | ICD-10-CM

## 2022-04-08 DIAGNOSIS — E782 Mixed hyperlipidemia: Secondary | ICD-10-CM

## 2022-04-08 DIAGNOSIS — I340 Nonrheumatic mitral (valve) insufficiency: Secondary | ICD-10-CM

## 2022-04-08 DIAGNOSIS — I959 Hypotension, unspecified: Secondary | ICD-10-CM

## 2022-04-08 MED ORDER — METOPROLOL SUCCINATE ER 100 MG PO TB24
50.0000 mg | ORAL_TABLET | Freq: Every day | ORAL | 0 refills | Status: DC
Start: 2022-04-08 — End: 2022-08-04

## 2022-04-08 NOTE — Progress Notes (Signed)
Cardiology Office Note:    Date:  04/09/2022   ID:  Cody Price, DOB 1941/06/06, MRN [id number]  PCP:  Baxter Hire, MD  [facility] Cardiologist:  Ida Rogue, MD  [facility] Electrophysiologist:  None   Referring MD: Baxter Hire, MD   Chief Complaint: hospital follow-up  History of Present Illness:    Cody Price is a 81 y.o. male with a hx of coronary artery disease status post multiple PCI's, chronic diastolic heart failure, permanent atrial fibrillation, type 2 diabetes, previous tobacco use, essential hypertension and hyperlipidemia who is being seen 03/22/2022 for the evaluation of hospital follow-up.   Patient has a history of CAD with multiple PCI's in the past.  He had initially stent placement to the RCA was found to be later occluded with collaterals.  He had subsequent LAD stent.  He has chronic A-fib on rate control and anticoagulation.  He also has a history of chronic anemia and thrombocytopenia followed by hematology.  He was hospitalized in December 2022 with COVID infection and non-STEMI.  He underwent cardiac cath showing EF 50 to 55%, patent LAD stent with no significant restenosis and chronically occluded RCA with left-to-right collaterals and moderate left circumflex disease.  Overall, there was no significant change in coronary anatomy since previous cardiac cath and the patient was treated medically.  Troponin elevation was felt to be due to supply demand ischemia.  Patient was recently admitted in late June with worsening exertional dyspnea fatigue and dizziness.  Reported minimal chest discomfort.  Troponin was elevated to 72.  Eliquis was held and she was started on IV heparin.  He had acute on chronic anemia with hemoglobin down to 7.5 requiring transfusion.  No clear source was identified on EGD.  He had hypotension and they did not proceed with colonoscopy.  He subsequently underwent right and left heart cath.  Echo showed LVEF 35 to 40%.   Cath showed patent LAD stent and otherwise unchanged anatomy.  Right heart cath showed elevated right heart pressures.  Patient underwent diuresis.  He was sent home on torsemide 40 mg twice daily.  Eliquis was resumed on July 24.  Spironolactone and Wilder Glade were added.  Today, the patient reports he still feels poorly being home. He feels weak and tired. He reports SOB and chest pain that is unchanged from the hospital. He is taking Eliquis '5mg'$  BID. BP is very soft today, with systolics into the 42V. He is taking torsemide '40mg'$  BID. They cut lisinorpil back to '5mg'$  daily due to low BP. PCP has ordered blood work for tomorrow. He is in rate controlled Afib.  Past Medical History:  Diagnosis Date   Arthritis    degenerative back    BPH with obstruction/lower urinary tract symptoms    CAD (coronary artery disease) 2010   stent   Diabetes mellitus    ED (erectile dysfunction)    Elevated PSA    GERD (gastroesophageal reflux disease)    Gross hematuria    H/O CT scan    recent renal CT due to hematuria, cystoscopy - normal     Hematuria    History of hiatal hernia    History of kidney stones    HLD (hyperlipidemia)    Hypertension    Ischemic heart disease 1991   with angioplasty   Kidney stone    Morbid obesity ([place])    Myocardial infarction ([place])    Peripheral neuropathy    Pernicious anemia  Pneumonia 1995   /w PE,    Pulmonary embolism ([place])    previous   Renal cyst    Sleep apnea    pt. denies sleep apnea, pt. states he has had 2 studies - last one 2011   Spinal stenosis     Past Surgical History:  Procedure Laterality Date   APPENDECTOMY     BACK SURGERY     x3 back surgeries - /w fusioin, 1- [place] 2- [facility]   bilateral inguinal hernia repair     cad     stent 2010   CARDIAC CATHETERIZATION  08/17/2014   CARPAL TUNNEL RELEASE  2014   bilateral    CHOLECYSTECTOMY     COLONOSCOPY WITH PROPOFOL N/A 07/11/2019   Procedure: COLONOSCOPY WITH PROPOFOL;  Surgeon:  Toledo, Benay Pike, MD;  Location: [facility] END[facility]COPY;  Service: Gastroenterology;  Laterality: N/A;   CORONARY BALLOON ANGIOPLASTY N/A 07/11/2020   Procedure: CORONARY BALLOON ANGIOPLASTY;  Surgeon: Isaias Cowman, MD;  Location: [place] [facility];  Service: Cardiovascular;  Laterality: N/A;   CORONARY STENT INTERVENTION N/A 06/07/2018   Procedure: CORONARY STENT INTERVENTION;  Surgeon: Nelva Bush, MD;  Location: [place] [facility];  Service: Cardiovascular;  Laterality: N/A;   CORONARY STENT INTERVENTION N/A 07/11/2020   Procedure: CORONARY STENT INTERVENTION;  Surgeon: Isaias Cowman, MD;  Location: [place] [facility];  Service: Cardiovascular;  Laterality: N/A;   ESOPHAGOGASTRODUODENOSCOPY (EGD) WITH PROPOFOL N/A 07/11/2019   Procedure: ESOPHAGOGASTRODUODENOSCOPY (EGD) WITH PROPOFOL;  Surgeon: Toledo, Benay Pike, MD;  Location: [facility];  Service: Gastroenterology;  Laterality: N/A;   ESOPHAGOGASTRODUODENOSCOPY (EGD) WITH PROPOFOL N/A 03/24/2022   Procedure: ESOPHAGOGASTRODUODENOSCOPY (EGD) WITH PROPOFOL;  Surgeon: Annamaria Helling, DO;  Location: [place];  Service: Gastroenterology;  Laterality: N/A;   LAMINECTOMY     LEFT HEART CATH AND CORONARY ANGIOGRAPHY N/A 06/07/2018   Procedure: LEFT HEART CATH AND CORONARY ANGIOGRAPHY;  Surgeon: Nelva Bush, MD;  Location: [place] [facility];  Service: Cardiovascular;  Laterality: N/A;   LEFT HEART CATH AND CORONARY ANGIOGRAPHY N/A 08/26/2021   Procedure: LEFT HEART CATH AND CORONARY ANGIOGRAPHY;  Surgeon: Wellington Hampshire, MD;  Location: [place] [facility];  Service: Cardiovascular;  Laterality: N/A;   RIGHT/LEFT HEART CATH AND CORONARY ANGIOGRAPHY N/A 07/11/2020   Procedure: RIGHT/LEFT HEART CATH AND CORONARY ANGIOGRAPHY;  Surgeon: Minna Merritts, MD;  Location: [place] [facility];  Service: Cardiovascular;  Laterality: N/A;   RIGHT/LEFT HEART CATH AND CORONARY ANGIOGRAPHY N/A 03/26/2022   Procedure:  RIGHT/LEFT HEART CATH AND CORONARY ANGIOGRAPHY;  Surgeon: Wellington Hampshire, MD;  Location: [place] [facility];  Service: Cardiovascular;  Laterality: N/A;   TEE WITHOUT CARDIOVERSION N/A 03/30/2022   Procedure: TRANSESOPHAGEAL ECHOCARDIOGRAM (TEE);  Surgeon: Kate Sable, MD;  Location: [facility];  Service: Cardiovascular;  Laterality: N/A;    Current Medications: Current Meds  Medication Sig   acetaminophen (TYLENOL) 500 MG tablet Take 500 mg by mouth as needed.    apixaban (ELIQUIS) 5 MG TABS tablet TAKE 1 TABLET BY MOUTH TWICE A DAY   atorvastatin (LIPITOR) 40 MG tablet TAKE 1 TABLET BY MOUTH DAILY AT 6 PM.   Coenzyme Q10 (CO Q-10) 200 MG CAPS Take 200 mg by mouth daily.   cyanocobalamin (,VITAMIN B-12,) 1000 MCG/ML injection Inject 1,000 mcg into the muscle every 30 (thirty) days.   dapagliflozin propanediol (FARXIGA) 10 MG TABS tablet Take 1 tablet (10 mg total) by mouth daily.   diazepam (VALIUM) 5 MG tablet Take  5 mg by mouth 3 (three) times daily as needed for anxiety or muscle spasms.   finasteride (PROSCAR) 5 MG tablet TAKE 1 TABLET BY MOUTH EVERY DAY   glimepiride (AMARYL) 4 MG tablet Take 1 tablet (4 mg total) by mouth 2 (two) times daily. Hold this medication until you see your PCP as you were started on farxiga as per cardio   KLOR-CON M20 20 MEQ tablet Take 20 mEq by mouth daily.   methocarbamol (ROBAXIN) 500 MG tablet Take 1 tablet (500 mg total) by mouth every 8 (eight) hours as needed for muscle spasms.   Multiple Vitamin (MULTIVITAMIN WITH MINERALS) TABS tablet Take 1 tablet by mouth daily.   omega-3 acid ethyl esters (LOVAZA) 1 g capsule Take 2 g by mouth daily.   omeprazole (PRILOSEC) 40 MG capsule Take 40 mg by mouth 2 (two) times daily.   oxyCODONE (OXY IR/ROXICODONE) 5 MG immediate release tablet Take 5 mg by mouth 4 (four) times daily as needed.   oxyCODONE-acetaminophen (PERCOCET) 10-325 MG tablet Take 1 tablet by mouth every 4 (four) hours as needed for pain.     potassium chloride (KLOR-CON) 10 MEQ tablet Take 10 mEq by mouth daily.   sildenafil (VIAGRA) 100 MG tablet Take 100 mg by mouth daily as needed for erectile dysfunction.   spironolactone (ALDACTONE) 25 MG tablet Take 0.5 tablets (12.5 mg total) by mouth daily.   sucralfate (CARAFATE) 1 g tablet Take 1 g by mouth 4 (four) times daily.   tamsulosin (FLOMAX) 0.4 MG CAPS capsule TAKE 1 CAPSULE BY MOUTH EVERY DAY   testosterone cypionate (DEPOTESTOSTERONE CYPIONATE) 200 MG/ML injection Inject 200 mg into the muscle every 28 (twenty-eight) days.   torsemide 40 MG TABS Take 40 mg by mouth 2 (two) times daily.   [DISCONTINUED] lisinopril (ZESTRIL) 10 MG tablet Take 10 mg by mouth daily.   [DISCONTINUED] lisinopril (ZESTRIL) 5 MG tablet Take 5 mg by mouth daily.   [DISCONTINUED] metoprolol succinate (TOPROL-XL) 100 MG 24 hr tablet Take 1 tablet (100 mg total) by mouth daily. Take with or immediately following a meal.     Allergies:   Novocain [procaine], Procaine hcl, Lyrica [pregabalin], Neurontin [gabapentin], Amitriptyline, Dicyclomine, Metformin, and Nitroglycerin   Social History   Socioeconomic History   Marital status: Married    Spouse name: Not on file   Number of children: Not on file   Years of education: Not on file   Highest education level: Not on file  Occupational History   Not on file  Tobacco Use   Smoking status: Former    Packs/day: 3.00    Years: 15.00    Total pack years: 45.00    Types: Cigarettes    Quit date: 10/18/1985    Years since quitting: 36.4   Smokeless tobacco: Former   Tobacco comments:    tobacco use- no   Vaping Use   Vaping Use: Never used  Substance and Sexual Activity   Alcohol use: Not Currently    Comment: rare   Drug use: No   Sexual activity: Not on file  Other Topics Concern   Not on file  Social History Narrative   Walks regularly. Retired.    Social Determinants of Health   Financial Resource Strain: Not on file  Food  Insecurity: Not on file  Transportation Needs: Not on file  Physical Activity: Not on file  Stress: Not on file  Social Connections: Not on file     Family History: The patient's  family history includes Heart attack in his brother; Heart disease in his father; Heart failure in his mother; Skin cancer in his sister. There is no history of Kidney disease, Prostate cancer, Kidney cancer, or Bladder Cancer.  ROS:   Please see the history of present illness.     All other systems reviewed and are negative.  EKGs/Labs/Other Studies Reviewed:    The following studies were reviewed today:  RHC/LHC 03/26/22: Diagnostic Dominance: Right    1.  Significant two-vessel coronary artery disease with patent LAD stents with minimal restenosis.  Chronically occluded right coronary artery with left-to-right collaterals.  Moderate left circumflex disease.  No significant change overall since most recent cardiac catheterization.  The coronary arteries are moderately calcified and diffusely diseased. 2.  Moderately reduced LV systolic function with an EF of 35 to 40%. 3.  Right heart catheterization showed moderately elevated filling pressures, mild pulmonary hypertension and normal cardiac output.  Pulmonary wedge pressure was 25 mmHg with very prominent V waves suggestive of significant mitral regurgitation.   Recommendations: The patient is still volume overloaded.  Continue diuresis. Recommend optimization of systolic heart failure. Mitral regurgitation might be underestimated by echocardiogram.  Consider transesophageal echocardiogram once his heart failure is optimized.  _____________    Echo 03/23/22:  1. Left ventricular ejection fraction, by estimation, is 50 to 55%. The  left ventricle has low normal function. The left ventricle demonstrates  regional wall motion abnormalities (see scoring diagram/findings for  description). There is moderate left  ventricular hypertrophy. Left ventricular  diastolic function could not be  evaluated. There is mild hypokinesis of the left ventricular, basal  inferior and inferolateral segments.   2. Right ventricular systolic function is normal. The right ventricular  size is mildly enlarged. There is moderately elevated pulmonary artery  systolic pressure. The estimated right ventricular systolic pressure is  49.7 mmHg.   3. Left atrial size was mildly dilated.   4. Right atrial size was mildly dilated.   5. The mitral valve is abnormal. Mild to moderate mitral valve  regurgitation.   6. Tricuspid valve regurgitation is mild to moderate.   7. The aortic valve is tricuspid. There is mild thickening of the aortic  valve. Aortic valve regurgitation is not visualized. Aortic valve  sclerosis is present, with no evidence of aortic valve stenosis.   8. Aortic dilatation noted. There is borderline dilatation of the aortic  root and of the ascending aorta, measuring 38 mm.   9. The inferior vena cava is dilated in size with <50% respiratory  variability, suggesting right atrial pressure of 15 mmHg.  _____________    TEE 7.25.2023    1. Left ventricular ejection fraction, by estimation, is 55 to 60%. The  left ventricle has normal function. The left ventricle has no regional  wall motion abnormalities.   2. Right ventricular systolic function is normal. The right ventricular  size is normal.   3. Left atrial size was moderately dilated. No left atrial/left atrial  appendage thrombus was detected.   4. The mitral valve is normal in structure. Moderate mitral valve  regurgitation, multiple jets noted. No evidence of mitral stenosis.   5. Tricuspid valve regurgitation is mild to moderate.   6. The aortic valve is normal in structure. Aortic valve regurgitation is  not visualized. No aortic stenosis is present.   7. There is mild (Grade II) atheroma plaque involving the aortic arch and  descending aorta.   8. The inferior vena cava is normal  in  size with greater than 50%  respiratory variability, suggesting right atrial pressure of 3 mmHg.   9. Agitated saline contrast bubble study was negative, with no evidence  of any interatrial shunt.   EKG:  EKG is ordered today.  The ekg ordered today demonstrates Afib, 82bpm, low voltage, poor baseline, no significant changes  Recent Labs: 09/29/2021: ALT 10 03/22/2022: B Natriuretic Peptide 431.0 03/29/2022: Magnesium 2.0 03/31/2022: BUN 21; Creatinine, Ser 1.26; Hemoglobin 11.1; Platelets 92; Potassium 3.6; Sodium 139  Recent Lipid Panel    Component Value Date/Time   CHOL 62 03/22/2022 1025   CHOL 94 08/26/2014 0355   TRIG 75 03/22/2022 1025   TRIG 191 08/26/2014 0355   HDL 20 (L) 03/22/2022 1025   HDL 23 (L) 08/26/2014 0355   CHOLHDL 3.1 03/22/2022 1025   VLDL 15 03/22/2022 1025   VLDL 38 08/26/2014 0355   LDLCALC 27 03/22/2022 1025   LDLCALC 33 08/26/2014 0355    Physical Exam:    VS:  BP (!) 78/52 Comment: with Dynamap  Pulse 82   Ht '6\' 2"'$  (1.88 m)   Wt 230 lb (104.3 kg)   SpO2 98%   BMI 29.53 kg/m     Wt Readings from Last 3 Encounters:  04/08/22 230 lb (104.3 kg)  03/31/22 229 lb 8 oz (104.1 kg)  09/29/21 233 lb (105.7 kg)     GEN:  Well nourished, well developed in no acute distress HEENT: Normal NECK: No JVD; No carotid bruits LYMPHATICS: No lymphadenopathy CARDIAC: RRR, no murmurs, rubs, gallops RESPIRATORY:  Clear to auscultation without rales, wheezing or rhonchi  ABDOMEN: Soft, non-tender, non-distended MUSCULOSKELETAL:  No edema; No deformity  SKIN: Warm and dry NEUROLOGIC:  Alert and oriented x 3 PSYCHIATRIC:  Normal affect   ASSESSMENT:    1. Chronic combined systolic and diastolic heart failure ([place])   2. Permanent atrial fibrillation ([place])   3. Coronary artery disease involving native coronary artery of native heart with angina pectoris ([place])   4. Anemia, unspecified type   5. Hypotension, unspecified hypotension type   6. Essential  hypertension   7. Hyperlipidemia, mixed   8. Mitral valve insufficiency, unspecified etiology    PLAN:    In order of problems listed above:  Chronic combined systolic and diastolic CHF Most recent echo showed improved LVEF 50-55%, moderate LVH, moderately elevated pulmonary artery pressure, mildly dilated atrium, mild to mod MR/TR, aortic root dilation 40m. The patient is euvolemic on exam. He is on Torsemide '40mg'$  BID. PCP wil check BMET tomorrow. BP is very low and we will stop lisinopril. We will also decrease Toprol to '50mg'$  daily. Continue Farxiga and spironolactone. Continue GDMT as able at follow-up.   CAD with prior stenting and chronic angina Demand ischemia Patient underwent diagnostic cardiac cath showing patent LAD stents with apical LAD disease and CTO of the RCA and moderate Cx disease. Cath sites are stable. He has chronic and unchanged chest pain. Continue Aspirin, statin and BB therapy. BP is limiting escalation of therapy. Can maybe consider Ranexa at follow-up if he is still having chest pain.   Hypotension H/o HTN Blood pressure today is 84/40. I will stop lisinopril as above. Decrease to Toprol '50mg'$  daily. We will re-evaluate at follow-up.   Permanent Afib EKG shows rate controlled Afib. With low BP we will go back down to Toprol '50mg'$  daily. PCP will check CBC tomorrow.   HLD LDL 27. Continue Lipitor.   Chronic Anemia Recent admission with Hgb  as los as 7.5 requiring transfusion. EGD without bleeding. No plan for colonoscopy. Discharge Hgb was 11.1 on 7/26. He has resumed Eliquis therapy. CBC tomorrow as above by PCP.   MR Moderate MR on TEE. We will follow this every 6-12 months with serial echocardiogram.   Disposition: Follow up in 1 month(s) with MD/APP    Signed, Jakelin Taussig Ninfa Meeker, PA-C  04/09/2022 8:11 AM    [place]

## 2022-04-08 NOTE — Patient Instructions (Signed)
Medication Instructions:   Your physician has recommended you make the following change in your medication:   STOP Lisinopril  DECREASE Metoprolol Succinate 50 mg daily - Cut current tablet in half  *If you need a refill on your cardiac medications before your next appointment, please call your pharmacy*   Lab Work:  None ordered  Testing/Procedures:  None ordered   Follow-Up: At [facility], you and your health needs are our priority.  As part of our continuing mission to provide you with exceptional heart care, we have created designated Provider Care Teams.  These Care Teams include your primary Cardiologist (physician) and Advanced Practice Providers (APPs -  Physician Assistants and Nurse Practitioners) who all work together to provide you with the care you need, when you need it.  We recommend signing up for the patient portal called "MyChart".  Sign up information is provided on this After Visit Summary.  MyChart is used to connect with patients for Virtual Visits (Telemedicine).  Patients are able to view lab/test results, encounter notes, upcoming appointments, etc.  Non-urgent messages can be sent to your provider as well.   To learn more about what you can do with MyChart, go to [email or web address].    Your next appointment:   1 month(s)  The format for your next appointment:   In Person  Provider:   You may see Ida Rogue, MD or one of the following Advanced Practice Providers on your designated Care Team:   Murray Hodgkins, NP Christell Faith, PA-C Cadence Kathlen Mody, [place]   Important Information About Sugar

## 2022-05-07 ENCOUNTER — Other Ambulatory Visit: Payer: Self-pay | Admitting: Cardiovascular Disease

## 2022-05-11 NOTE — Telephone Encounter (Signed)
Refill Request.  

## 2022-05-11 NOTE — Telephone Encounter (Signed)
Prescription refill request for Eliquis received. Indication: AF Last office visit: 04/08/22  C Furth PA-C Scr: 1.2 on 04/22/22 Age: 81 Weight: 104.3kg  Based on above findings Eliquis '5mg'$  twice daily is the appropriate dose.  Refill approved.

## 2022-05-19 ENCOUNTER — Ambulatory Visit: Payer: Medicare HMO | Admitting: Medical

## 2022-05-21 ENCOUNTER — Other Ambulatory Visit: Payer: Self-pay | Admitting: Cardiovascular Disease

## 2022-06-08 ENCOUNTER — Other Ambulatory Visit: Payer: Self-pay | Admitting: Cardiovascular Disease

## 2022-06-09 NOTE — Telephone Encounter (Signed)
Refill denied patient no longer followed by our office. Patient followed by [facility]

## 2022-06-23 ENCOUNTER — Other Ambulatory Visit: Payer: Self-pay | Admitting: Urology

## 2022-07-06 ENCOUNTER — Ambulatory Visit: Payer: Medicare HMO | Admitting: Cardiovascular Disease

## 2022-07-31 ENCOUNTER — Inpatient Hospital Stay
Admission: EM | Admit: 2022-07-31 | Discharge: 2022-08-04 | DRG: 291 | Disposition: A | Discharge status: Home Health | Payer: Medicare HMO | Attending: Internal Medicine | Admitting: Internal Medicine

## 2022-07-31 ENCOUNTER — Other Ambulatory Visit: Payer: Self-pay

## 2022-07-31 ENCOUNTER — Emergency Department: Payer: Medicare HMO

## 2022-07-31 DIAGNOSIS — Z87891 Personal history of nicotine dependence: Secondary | ICD-10-CM

## 2022-07-31 DIAGNOSIS — I2489 Other forms of acute ischemic heart disease: Secondary | ICD-10-CM | POA: Diagnosis present

## 2022-07-31 DIAGNOSIS — N401 Enlarged prostate with lower urinary tract symptoms: Secondary | ICD-10-CM | POA: Diagnosis present

## 2022-07-31 DIAGNOSIS — Z66 Do not resuscitate: Secondary | ICD-10-CM | POA: Diagnosis present

## 2022-07-31 DIAGNOSIS — I493 Ventricular premature depolarization: Secondary | ICD-10-CM | POA: Diagnosis present

## 2022-07-31 DIAGNOSIS — K219 Gastro-esophageal reflux disease without esophagitis: Secondary | ICD-10-CM | POA: Diagnosis present

## 2022-07-31 DIAGNOSIS — I2510 Atherosclerotic heart disease of native coronary artery without angina pectoris: Secondary | ICD-10-CM | POA: Diagnosis present

## 2022-07-31 DIAGNOSIS — N138 Other obstructive and reflux uropathy: Secondary | ICD-10-CM | POA: Diagnosis present

## 2022-07-31 DIAGNOSIS — I110 Hypertensive heart disease with heart failure: Principal | ICD-10-CM | POA: Diagnosis present

## 2022-07-31 DIAGNOSIS — I480 Paroxysmal atrial fibrillation: Secondary | ICD-10-CM | POA: Diagnosis not present

## 2022-07-31 DIAGNOSIS — M545 Low back pain, unspecified: Secondary | ICD-10-CM | POA: Diagnosis present

## 2022-07-31 DIAGNOSIS — I4891 Unspecified atrial fibrillation: Secondary | ICD-10-CM | POA: Diagnosis present

## 2022-07-31 DIAGNOSIS — I5030 Unspecified diastolic (congestive) heart failure: Secondary | ICD-10-CM | POA: Insufficient documentation

## 2022-07-31 DIAGNOSIS — Z8249 Family history of ischemic heart disease and other diseases of the circulatory system: Secondary | ICD-10-CM | POA: Diagnosis not present

## 2022-07-31 DIAGNOSIS — D696 Thrombocytopenia, unspecified: Secondary | ICD-10-CM | POA: Diagnosis present

## 2022-07-31 DIAGNOSIS — Z955 Presence of coronary angioplasty implant and graft: Secondary | ICD-10-CM | POA: Diagnosis not present

## 2022-07-31 DIAGNOSIS — Z79899 Other long term (current) drug therapy: Secondary | ICD-10-CM

## 2022-07-31 DIAGNOSIS — N400 Enlarged prostate without lower urinary tract symptoms: Secondary | ICD-10-CM | POA: Diagnosis present

## 2022-07-31 DIAGNOSIS — E785 Hyperlipidemia, unspecified: Secondary | ICD-10-CM | POA: Diagnosis present

## 2022-07-31 DIAGNOSIS — Z888 Allergy status to other drugs, medicaments and biological substances status: Secondary | ICD-10-CM

## 2022-07-31 DIAGNOSIS — R0902 Hypoxemia: Secondary | ICD-10-CM | POA: Diagnosis present

## 2022-07-31 DIAGNOSIS — I4819 Other persistent atrial fibrillation: Secondary | ICD-10-CM | POA: Diagnosis present

## 2022-07-31 DIAGNOSIS — M4800 Spinal stenosis, site unspecified: Secondary | ICD-10-CM | POA: Diagnosis present

## 2022-07-31 DIAGNOSIS — Z7901 Long term (current) use of anticoagulants: Secondary | ICD-10-CM

## 2022-07-31 DIAGNOSIS — I5033 Acute on chronic diastolic (congestive) heart failure: Secondary | ICD-10-CM | POA: Diagnosis present

## 2022-07-31 DIAGNOSIS — R7989 Other specified abnormal findings of blood chemistry: Secondary | ICD-10-CM | POA: Diagnosis present

## 2022-07-31 DIAGNOSIS — Z7984 Long term (current) use of oral hypoglycemic drugs: Secondary | ICD-10-CM

## 2022-07-31 DIAGNOSIS — G8929 Other chronic pain: Secondary | ICD-10-CM | POA: Diagnosis present

## 2022-07-31 DIAGNOSIS — F419 Anxiety disorder, unspecified: Secondary | ICD-10-CM | POA: Diagnosis present

## 2022-07-31 DIAGNOSIS — E119 Type 2 diabetes mellitus without complications: Secondary | ICD-10-CM

## 2022-07-31 DIAGNOSIS — I10 Essential (primary) hypertension: Secondary | ICD-10-CM | POA: Diagnosis not present

## 2022-07-31 LAB — CBC
HCT: 30.1 % — ABNORMAL LOW (ref 39.0–52.0)
Hemoglobin: 9.9 g/dL — ABNORMAL LOW (ref 13.0–17.0)
MCH: 29.9 pg (ref 26.0–34.0)
MCHC: 32.9 g/dL (ref 30.0–36.0)
MCV: 90.9 fL (ref 80.0–100.0)
Platelets: 85 10*3/uL — ABNORMAL LOW (ref 150–400)
RBC: 3.31 MIL/uL — ABNORMAL LOW (ref 4.22–5.81)
RDW: 17.3 % — ABNORMAL HIGH (ref 11.5–15.5)
WBC: 7.6 10*3/uL (ref 4.0–10.5)
nRBC: 0 % (ref 0.0–0.2)

## 2022-07-31 LAB — TROPONIN I (HIGH SENSITIVITY)
Troponin I (High Sensitivity): 49 ng/L — ABNORMAL HIGH (ref <18)
Troponin I (High Sensitivity): 46 ng/L — ABNORMAL HIGH (ref <18)

## 2022-07-31 LAB — BASIC METABOLIC PANEL
Anion gap: 8 (ref 5–15)
BUN: 12 mg/dL (ref 8–23)
CO2: 26 mmol/L (ref 22–32)
Calcium: 9 mg/dL (ref 8.9–10.3)
Chloride: 107 mmol/L (ref 98–111)
Creatinine, Ser: 0.78 mg/dL (ref 0.61–1.24)
GFR, Estimated: >60 mL/min (ref >60)
Glucose, Bld: 234 mg/dL — ABNORMAL HIGH (ref 70–99)
Potassium: 3.8 mmol/L (ref 3.5–5.1)
Sodium: 141 mmol/L (ref 135–145)

## 2022-07-31 LAB — CBG MONITORING, ED: Glucose-Capillary: 210 mg/dL — ABNORMAL HIGH (ref 70–99)

## 2022-07-31 LAB — BRAIN NATRIURETIC PEPTIDE: B Natriuretic Peptide: 729.6 pg/mL — ABNORMAL HIGH (ref 0.0–100.0)

## 2022-07-31 MED ORDER — SODIUM CHLORIDE 0.9 % IV SOLN: 250.0000 mL | INTRAVENOUS | Status: DC | PRN

## 2022-07-31 MED ORDER — ATORVASTATIN CALCIUM 20 MG PO TABS
40.0000 mg | ORAL_TABLET | Freq: Every day | ORAL | Status: DC
  Administered 2022-07-31 – 2022-08-04 (×5): 40 mg via ORAL
  Filled 2022-07-31 (×5): qty 2

## 2022-07-31 MED ORDER — ADULT MULTIVITAMIN W/MINERALS CH
1.0000 | ORAL_TABLET | Freq: Every day | ORAL | Status: DC
  Administered 2022-08-01 – 2022-08-04 (×4): 1 via ORAL
  Filled 2022-07-31 (×4): qty 1

## 2022-07-31 MED ORDER — OXYCODONE-ACETAMINOPHEN 10-325 MG PO TABS: 1.0000 | ORAL_TABLET | Freq: Four times a day (QID) | ORAL | Status: DC | PRN

## 2022-07-31 MED ORDER — FUROSEMIDE 10 MG/ML IJ SOLN
40.0000 mg | Freq: Two times a day (BID) | INTRAMUSCULAR | Status: DC
  Administered 2022-07-31: 40 mg via INTRAVENOUS
  Filled 2022-07-31: qty 4

## 2022-07-31 MED ORDER — PANTOPRAZOLE SODIUM 40 MG PO TBEC
40.0000 mg | DELAYED_RELEASE_TABLET | Freq: Every day | ORAL | Status: DC
  Administered 2022-07-31 – 2022-08-04 (×5): 40 mg via ORAL
  Filled 2022-07-31 (×5): qty 1

## 2022-07-31 MED ORDER — FINASTERIDE 5 MG PO TABS
5.0000 mg | ORAL_TABLET | Freq: Every day | ORAL | Status: DC
  Administered 2022-07-31 – 2022-08-04 (×5): 5 mg via ORAL
  Filled 2022-07-31 (×5): qty 1

## 2022-07-31 MED ORDER — DIAZEPAM 5 MG PO TABS
5.0000 mg | ORAL_TABLET | Freq: Three times a day (TID) | ORAL | Status: DC | PRN
  Administered 2022-08-01 – 2022-08-04 (×5): 5 mg via ORAL
  Filled 2022-07-31 (×6): qty 1

## 2022-07-31 MED ORDER — OXYCODONE-ACETAMINOPHEN 5-325 MG PO TABS
2.0000 | ORAL_TABLET | Freq: Once | ORAL | Status: AC
  Administered 2022-07-31: 2 via ORAL
  Filled 2022-07-31: qty 2

## 2022-07-31 MED ORDER — OXYCODONE HCL 5 MG PO TABS
5.0000 mg | ORAL_TABLET | Freq: Four times a day (QID) | ORAL | Status: DC | PRN
  Administered 2022-07-31 – 2022-08-03 (×7): 5 mg via ORAL
  Filled 2022-07-31 (×7): qty 1

## 2022-07-31 MED ORDER — ONDANSETRON HCL 4 MG PO TABS
4.0000 mg | ORAL_TABLET | Freq: Four times a day (QID) | ORAL | Status: DC | PRN
  Administered 2022-07-31 – 2022-08-01 (×2): 4 mg via ORAL
  Filled 2022-07-31 (×2): qty 1

## 2022-07-31 MED ORDER — METHOCARBAMOL 500 MG PO TABS: 500.0000 mg | ORAL_TABLET | Freq: Three times a day (TID) | ORAL | Status: DC | PRN

## 2022-07-31 MED ORDER — APIXABAN 5 MG PO TABS
5.0000 mg | ORAL_TABLET | Freq: Two times a day (BID) | ORAL | Status: DC
  Administered 2022-07-31 – 2022-08-04 (×8): 5 mg via ORAL
  Filled 2022-07-31 (×8): qty 1

## 2022-07-31 MED ORDER — SPIRONOLACTONE 12.5 MG HALF TABLET
12.5000 mg | ORAL_TABLET | Freq: Every day | ORAL | Status: DC
  Administered 2022-07-31 – 2022-08-04 (×5): 12.5 mg via ORAL
  Filled 2022-07-31 (×5): qty 1

## 2022-07-31 MED ORDER — SUCRALFATE 1 G PO TABS
1.0000 g | ORAL_TABLET | Freq: Four times a day (QID) | ORAL | Status: DC
  Administered 2022-07-31 – 2022-08-04 (×17): 1 g via ORAL
  Filled 2022-07-31 (×17): qty 1

## 2022-07-31 MED ORDER — TAMSULOSIN HCL 0.4 MG PO CAPS
0.4000 mg | ORAL_CAPSULE | Freq: Every day | ORAL | Status: DC
  Administered 2022-07-31 – 2022-08-04 (×5): 0.4 mg via ORAL
  Filled 2022-07-31 (×5): qty 1

## 2022-07-31 MED ORDER — METOPROLOL SUCCINATE ER 50 MG PO TB24
50.0000 mg | ORAL_TABLET | Freq: Every day | ORAL | Status: DC
  Administered 2022-07-31 – 2022-08-04 (×5): 50 mg via ORAL
  Filled 2022-07-31 (×5): qty 1

## 2022-07-31 MED ORDER — POTASSIUM CHLORIDE CRYS ER 20 MEQ PO TBCR
20.0000 meq | EXTENDED_RELEASE_TABLET | Freq: Every day | ORAL | Status: DC
  Administered 2022-07-31 – 2022-08-03 (×4): 20 meq via ORAL
  Filled 2022-07-31 (×5): qty 1

## 2022-07-31 MED ORDER — OMEGA-3-ACID ETHYL ESTERS 1 G PO CAPS
2.0000 g | ORAL_CAPSULE | Freq: Every day | ORAL | Status: DC
  Administered 2022-08-01 – 2022-08-04 (×4): 2 g via ORAL
  Filled 2022-07-31 (×4): qty 2

## 2022-07-31 MED ORDER — SODIUM CHLORIDE 0.9% FLUSH
3.0000 mL | Freq: Two times a day (BID) | INTRAVENOUS | Status: DC
  Administered 2022-07-31 – 2022-08-04 (×9): 3 mL via INTRAVENOUS

## 2022-07-31 MED ORDER — FUROSEMIDE 10 MG/ML IJ SOLN
60.0000 mg | Freq: Once | INTRAMUSCULAR | Status: AC
  Administered 2022-07-31: 60 mg via INTRAVENOUS
  Filled 2022-07-31: qty 8

## 2022-07-31 MED ORDER — ONDANSETRON HCL 4 MG/2ML IJ SOLN
4.0000 mg | Freq: Four times a day (QID) | INTRAMUSCULAR | Status: DC | PRN
  Administered 2022-08-03 (×2): 4 mg via INTRAVENOUS
  Filled 2022-07-31 (×2): qty 2

## 2022-07-31 MED ORDER — INSULIN ASPART 100 UNIT/ML IJ SOLN
0.0000 [IU] | Freq: Three times a day (TID) | INTRAMUSCULAR | Status: DC
  Administered 2022-07-31: 5 [IU] via SUBCUTANEOUS
  Administered 2022-08-01: 2 [IU] via SUBCUTANEOUS
  Administered 2022-08-01 – 2022-08-02 (×3): 3 [IU] via SUBCUTANEOUS
  Administered 2022-08-02: 2 [IU] via SUBCUTANEOUS
  Administered 2022-08-02 – 2022-08-04 (×5): 5 [IU] via SUBCUTANEOUS
  Administered 2022-08-04: 3 [IU] via SUBCUTANEOUS
  Administered 2022-08-04: 5 [IU] via SUBCUTANEOUS
  Filled 2022-07-31 (×13): qty 1

## 2022-07-31 MED ORDER — OXYCODONE-ACETAMINOPHEN 5-325 MG PO TABS
1.0000 | ORAL_TABLET | Freq: Four times a day (QID) | ORAL | Status: DC | PRN
  Administered 2022-07-31 – 2022-08-04 (×10): 1 via ORAL
  Filled 2022-07-31 (×11): qty 1

## 2022-07-31 MED ORDER — SODIUM CHLORIDE 0.9% FLUSH: 3.0000 mL | INTRAVENOUS | Status: DC | PRN

## 2022-07-31 NOTE — Assessment & Plan Note (Addendum)
Last 2D echo showed an echocardiogram of 55 to 60% from 07/23.  Take torsemide at home. --cardiology consulted Plan: --cont IV lasix 40 BID Continue metoprolol and spironolactone

## 2022-07-31 NOTE — H&P (Addendum)
History and Physical    Patient: Cody Price NAT:[id number] DOB: [birth year] DOA: 07/31/2022 DOS: the patient was seen and examined on 07/31/2022 PCP: Baxter Hire, MD  Patient coming from: Home  Chief Complaint:  Chief Complaint  Patient presents with   Shortness of Breath   HPI: Cody Price is a 81 y.o. male with medical history significant for GERD, hypertension, chronic diastolic dysfunction CHF, history of spinal stenosis, coronary artery disease status post stent angioplasty, diabetes mellitus who presents to the ER for evaluation of worsening shortness of breath over the last 1 week. Cody Price denies having any dietary indiscretion and has been compliant with his medications.  Denies any significant weight gain. Patient states that 1 day prior to his admission that Cody Price had worsening shortness of breath, two-pillow orthopnea and bilateral lower extremity swelling.  Cody Price also complains of some midsternal chest pain that is nonradiating and denies having any nausea, no vomiting, no palpitations or diaphoresis. Cody Price denies having any abdominal pain, no changes in his bowel habits, no cough, no fever, no chills, no urinary symptoms, no headache, no dizziness, no lightheadedness, no blurred vision, no focal deficit. Labs show elevated BNP levels Chest x-ray reviewed by me shows diffuse interstitial opacity suggests edema.  Twelve-lead EKG reviewed by me showed A-fib with PVC's and low voltage QRS   Review of Systems: As mentioned in the history of present illness. All other systems reviewed and are negative. Past Medical History:  Diagnosis Date   Arthritis    degenerative back    BPH with obstruction/lower urinary tract symptoms    CAD (coronary artery disease) 2010   stent   Diabetes mellitus    ED (erectile dysfunction)    Elevated PSA    GERD (gastroesophageal reflux disease)    Gross hematuria    H/O CT scan    recent renal CT due to hematuria, cystoscopy - normal      Hematuria    History of hiatal hernia    History of kidney stones    HLD (hyperlipidemia)    Hypertension    Ischemic heart disease 1991   with angioplasty   Kidney stone    Morbid obesity ([place])    Myocardial infarction ([place])    Peripheral neuropathy    Pernicious anemia    Pneumonia 1995   /w PE,    Pulmonary embolism ([place])    previous   Renal cyst    Sleep apnea    pt. denies sleep apnea, pt. states Cody Price has had 2 studies - last one 2011   Spinal stenosis    Past Surgical History:  Procedure Laterality Date   APPENDECTOMY     BACK SURGERY     x3 back surgeries - /w fusioin, 1- [place] 2- [facility]   bilateral inguinal hernia repair     cad     stent 2010   CARDIAC CATHETERIZATION  08/17/2014   CARPAL TUNNEL RELEASE  2014   bilateral    CHOLECYSTECTOMY     COLONOSCOPY WITH PROPOFOL N/A 07/11/2019   Procedure: COLONOSCOPY WITH PROPOFOL;  Surgeon: Toledo, Benay Pike, MD;  Location: [facility]COPY;  Service: Gastroenterology;  Laterality: N/A;   CORONARY BALLOON ANGIOPLASTY N/A 07/11/2020   Procedure: CORONARY BALLOON ANGIOPLASTY;  Surgeon: Isaias Cowman, MD;  Location: [place] [facility];  Service: Cardiovascular;  Laterality: N/A;   CORONARY STENT INTERVENTION N/A 06/07/2018   Procedure: CORONARY STENT INTERVENTION;  Surgeon: Nelva Bush, MD;  Location: [place] [facility];  Service: Cardiovascular;  Laterality: N/A;   CORONARY STENT INTERVENTION N/A 07/11/2020   Procedure: CORONARY STENT INTERVENTION;  Surgeon: Isaias Cowman, MD;  Location: [place] [facility];  Service: Cardiovascular;  Laterality: N/A;   ESOPHAGOGASTRODUODENOSCOPY (EGD) WITH PROPOFOL N/A 07/11/2019   Procedure: ESOPHAGOGASTRODUODENOSCOPY (EGD) WITH PROPOFOL;  Surgeon: Toledo, Benay Pike, MD;  Location: [facility];  Service: Gastroenterology;  Laterality: N/A;   ESOPHAGOGASTRODUODENOSCOPY (EGD) WITH PROPOFOL N/A 03/24/2022   Procedure: ESOPHAGOGASTRODUODENOSCOPY (EGD) WITH PROPOFOL;   Surgeon: Annamaria Helling, DO;  Location: [place];  Service: Gastroenterology;  Laterality: N/A;   LAMINECTOMY     LEFT HEART CATH AND CORONARY ANGIOGRAPHY N/A 06/07/2018   Procedure: LEFT HEART CATH AND CORONARY ANGIOGRAPHY;  Surgeon: Nelva Bush, MD;  Location: [place] [facility];  Service: Cardiovascular;  Laterality: N/A;   LEFT HEART CATH AND CORONARY ANGIOGRAPHY N/A 08/26/2021   Procedure: LEFT HEART CATH AND CORONARY ANGIOGRAPHY;  Surgeon: Wellington Hampshire, MD;  Location: [place] [facility];  Service: Cardiovascular;  Laterality: N/A;   RIGHT/LEFT HEART CATH AND CORONARY ANGIOGRAPHY N/A 07/11/2020   Procedure: RIGHT/LEFT HEART CATH AND CORONARY ANGIOGRAPHY;  Surgeon: Minna Merritts, MD;  Location: [place] [facility];  Service: Cardiovascular;  Laterality: N/A;   RIGHT/LEFT HEART CATH AND CORONARY ANGIOGRAPHY N/A 03/26/2022   Procedure: RIGHT/LEFT HEART CATH AND CORONARY ANGIOGRAPHY;  Surgeon: Wellington Hampshire, MD;  Location: [place] [facility];  Service: Cardiovascular;  Laterality: N/A;   TEE WITHOUT CARDIOVERSION N/A 03/30/2022   Procedure: TRANSESOPHAGEAL ECHOCARDIOGRAM (TEE);  Surgeon: Kate Sable, MD;  Location: [facility];  Service: Cardiovascular;  Laterality: N/A;   Social History:  reports that Cody Price quit smoking about 36 years ago. His smoking use included cigarettes. Cody Price has a 45.00 pack-year smoking history. Cody Price has quit using smokeless tobacco. Cody Price reports that Cody Price does not currently use alcohol. Cody Price reports that Cody Price does not use drugs.  Allergies  Allergen Reactions   Novocain [Procaine] Other (See Comments)    Syncope   Procaine Hcl Other (See Comments)    Passes out   Lyrica [Pregabalin]    Neurontin [Gabapentin]    Amitriptyline Other (See Comments)    Sleepy   Dicyclomine Other (See Comments)    Other reaction(s): Abdominal Pain   Metformin Nausea Only   Nitroglycerin Other (See Comments)    Makes Blood pressure go to low.     Family  History  Problem Relation Age of Onset   Heart failure Mother    Heart disease Father    Skin cancer Sister    Heart attack Brother    Kidney disease Neg Hx    Prostate cancer Neg Hx    Kidney cancer Neg Hx    Bladder Cancer Neg Hx     Prior to Admission medications   Medication Sig Start Date End Date Taking? Authorizing Provider  acetaminophen (TYLENOL) 500 MG tablet Take 500 mg by mouth as needed.     [provider]  atorvastatin (LIPITOR) 40 MG tablet TAKE 1 TABLET BY MOUTH DAILY AT 6 PM. Patient taking differently: Take 40 mg by mouth daily. TAKE 1 TABLET BY MOUTH DAILY AT 6 PM. 05/24/22   Gollan, Kathlene November, MD  Coenzyme Q10 (CO Q-10) 200 MG CAPS Take 200 mg by mouth daily.    [provider]  cyanocobalamin (,VITAMIN B-12,) 1000 MCG/ML injection Inject 1,000 mcg into the muscle every 30 (thirty) days. 09/12/19   [provider]  diazepam (VALIUM) 5 MG tablet  Take 5 mg by mouth 3 (three) times daily as needed for anxiety or muscle spasms.    [provider]  ELIQUIS 5 MG TABS tablet TAKE 1 TABLET BY MOUTH TWICE A DAY Patient taking differently: Take 5 mg by mouth 2 (two) times daily. 05/11/22   Minna Merritts, MD  finasteride (PROSCAR) 5 MG tablet TAKE 1 TABLET BY MOUTH EVERY DAY Patient taking differently: Take 5 mg by mouth daily. 12/01/20   Zara Council A, PA-C  fluticasone (FLONASE) 50 MCG/ACT nasal spray Place 2 sprays into both nostrils daily. 08/29/21 11/27/21  [place] Donnamarie Poag, DO  glimepiride (AMARYL) 4 MG tablet Take 1 tablet (4 mg total) by mouth 2 (two) times daily. Hold this medication until you see your PCP as you were started on farxiga as per cardio 03/31/22   Wyvonnia Dusky, MD  KLOR-CON M20 20 MEQ tablet Take 20 mEq by mouth daily. 12/31/21   [provider]  methocarbamol (ROBAXIN) 500 MG tablet Take 1 tablet (500 mg total) by mouth every 8 (eight) hours as needed for muscle spasms. 09/29/21   Carrie Mew, MD   metoprolol succinate (TOPROL-XL) 100 MG 24 hr tablet Take 0.5 tablets (50 mg total) by mouth daily. Take with or immediately following a meal. 04/08/22 05/08/22  Furth, Cadence H, PA-C  metoprolol succinate (TOPROL-XL) 50 MG 24 hr tablet TAKE 1 TABLET BY MOUTH EVERY DAY WITH OR IMMEDIATELY FOLLOWING A MEAL Patient taking differently: Take 50 mg by mouth daily. 05/24/22   Minna Merritts, MD  Multiple Vitamin (MULTIVITAMIN WITH MINERALS) TABS tablet Take 1 tablet by mouth daily. 06/08/18   Loletha Grayer, MD  omega-3 acid ethyl esters (LOVAZA) 1 g capsule Take 2 g by mouth daily.    [provider]  omeprazole (PRILOSEC) 40 MG capsule Take 40 mg by mouth 2 (two) times daily. 06/07/19   [provider]  oxyCODONE (OXY IR/ROXICODONE) 5 MG immediate release tablet Take 5 mg by mouth 4 (four) times daily as needed. 03/22/22   [provider]  oxyCODONE-acetaminophen (PERCOCET) 10-325 MG tablet Take 1 tablet by mouth every 4 (four) hours as needed for pain.     [provider]  sildenafil (VIAGRA) 100 MG tablet Take 100 mg by mouth daily as needed for erectile dysfunction.    [provider]  spironolactone (ALDACTONE) 25 MG tablet Take 0.5 tablets (12.5 mg total) by mouth daily. 03/31/22 04/30/22  Wyvonnia Dusky, MD  sucralfate (CARAFATE) 1 g tablet Take 1 g by mouth 4 (four) times daily. 03/13/22   [provider]  tamsulosin (FLOMAX) 0.4 MG CAPS capsule TAKE 1 CAPSULE BY MOUTH EVERY DAY 12/01/20   Ernestine Conrad, Larene Beach A, PA-C  testosterone cypionate (DEPOTESTOSTERONE CYPIONATE) 200 MG/ML injection Inject 200 mg into the muscle every 28 (twenty-eight) days.    [provider]  torsemide 40 MG TABS Take 40 mg by mouth 2 (two) times daily. 03/31/22 04/30/22  Wyvonnia Dusky, MD    Physical Exam: Vitals:   07/31/22 0827 07/31/22 1000 07/31/22 1111 07/31/22 1400  BP:  127/87 (!) 141/86 130/65  Pulse:  87 98 89  Resp:  (!) '22 20 20  '$ Temp:     98.1 F (36.7 C)  TempSrc:    Oral  SpO2:  100% 98% 100%  Weight: 106.6 kg  107.7 kg   Height: '6\' 1"'$  (1.854 m)      Physical Exam Vitals and nursing note reviewed.  HENT:  Head: Normocephalic and atraumatic.     Mouth/Throat:     Mouth: Mucous membranes are moist.  Eyes:     Pupils: Pupils are equal, round, and reactive to light.  Cardiovascular:     Rate and Rhythm: Tachycardia present. Rhythm irregular.  Pulmonary:     Effort: Tachypnea present.     Breath sounds: Examination of the right-lower field reveals rales. Examination of the left-lower field reveals rales. Rales present.  Abdominal:     General: Bowel sounds are normal.     Palpations: Abdomen is soft.  Musculoskeletal:     Cervical back: Normal range of motion and neck supple.     Right lower leg: Edema present.     Left lower leg: Edema present.  Skin:    General: Skin is warm and dry.  Neurological:     General: No focal deficit present.     Mental Status: Cody Price is alert.  Psychiatric:        Mood and Affect: Mood normal.        Behavior: Behavior normal.     Data Reviewed: Relevant notes from primary care and specialist visits, past discharge summaries as available in EHR, including Care Everywhere. Prior diagnostic testing as pertinent to current admission diagnoses Updated medications and problem lists for reconciliation ED course, including vitals, labs, imaging, treatment and response to treatment Triage notes, nursing and pharmacy notes and ED provider's notes Notable results as noted in HPI Labs reviewed.  BNP 729, troponin 49 >> 46, white count 7.6, hemoglobin 9.9, hematocrit 30, platelet count 85, sodium 141, potassium 3.8, chloride 107, bicarb 26, glucose 234, BUN 12, creatinine 0.78, calcium 9.0  There are no new results to review at this time.  Assessment and Plan: Acute on chronic heart failure with preserved ejection fraction (HFpEF) ([place]) Patient with a history of heart failure with  preserved LVEF Last 2D echo showed an echocardiogram of 55 to 60% from 07/23 Continue Lasix 40 mg IV every 12 Hold torsemide Continue metoprolol and spironolactone We will consult cardiology  Diabetes mellitus without complication (HCC) On oral hypoglycemic agents Hold Amaryl Maintain consistent carbohydrate diet Glycemic control with sliding scale insulin  Thrombocytopenia (HCC) Chronic No evidence of bleeding Monitor closely during this hospitalization   BPH (benign prostatic hyperplasia) Continue Flomax and finasteride  Hypertension Continue metoprolol  Anxiety Continue diazepam as needed   Elevated troponin I level Most likely secondary to demand ischemia from acute on chronic CHF with preserved EF. Troponin appears flat We will monitor closely  Coronary artery disease involving native coronary artery of native heart without angina pectoris Status post PCI Continue metoprolol and atorvastatin  Spinal stenosis With chronic low back pain Continue oxycodone  ATRIAL FIBRILLATION Continue metoprolol for rate control Continue apixaban as primary prophylaxis for an acute stroke      Advance Care Planning:   Code Status: DNR   Consults: Cardiology  Family Communication: Greater than 50% of time was spent discussing patient's condition and plan of care with him at the bedside.  All questions and concerns have been addressed.  Cody Price verbalizes understanding and agrees with the plan.  CODE STATUS was discussed and Cody Price wishes to be a DNR.  Severity of Illness: The appropriate patient status for this patient is INPATIENT. Inpatient status is judged to be reasonable and necessary in order to provide the required intensity of service to ensure the patient's safety. The patient's presenting symptoms, physical exam findings, and initial radiographic and laboratory data in  the context of their chronic comorbidities is felt to place them at high risk for further clinical  deterioration. Furthermore, it is not anticipated that the patient will be medically stable for discharge from the hospital within 2 midnights of admission.   * I certify that at the point of admission it is my clinical judgment that the patient will require inpatient hospital care spanning beyond 2 midnights from the point of admission due to high intensity of service, high risk for further deterioration and high frequency of surveillance required.*  Author: Collier Bullock, MD 07/31/2022 3:07 PM  For on call review [email or web address].

## 2022-07-31 NOTE — ED Provider Triage Note (Signed)
Emergency Medicine Provider Triage Evaluation Note  Cody Price , a 82 y.o. male  was evaluated in triage.  Pt complains of shortness of breath and chest pain. Chest pressure started a few days ago.  Could not lay flat last pm.   Review of Systems  Positive: + ankle swelling  Negative: No known fever or chills  Physical Exam  BP (!) 140/83   Pulse (!) 112   Temp 98 F (36.7 C)   Resp (!) 22   Ht '6\' 1"'$  (1.854 m)   Wt 106.6 kg   SpO2 99%   BMI 31.00 kg/m  Gen:   Awake, no distress.  Able to speak in complete sentences but slight SOB noted.  Resp:  Normal effort.  No wheezing noted. Crackles noted bilaterally at present.  MSK:   Moves extremities without difficulty  Other:    Medical Decision Making  Medically screening exam initiated at 8:30 AM.  Appropriate orders placed.  LORENSO Price was informed that the remainder of the evaluation will be completed by another provider, this initial triage assessment does not replace that evaluation, and the importance of remaining in the ED until their evaluation is complete.     Cody Hai, PA-C 07/31/22 [phone]

## 2022-07-31 NOTE — Assessment & Plan Note (Signed)
Most likely secondary to demand ischemia from acute on chronic CHF with preserved EF. Troponin appears flat We will monitor closely

## 2022-07-31 NOTE — Assessment & Plan Note (Signed)
On oral hypoglycemic agents Hold Amaryl Maintain consistent carbohydrate diet Glycemic control with sliding scale insulin

## 2022-07-31 NOTE — ED Notes (Signed)
Pt presents to ED with increasing SOB for the past week. Pt states he does have a HX of CHF and states he takes his diuretic daily. Pt denies any weight gain but does have swelling to BLE that pt states has got worse. Pt denies any any supplemental O2 use at home. Pt shows no s./s of distress, pt speaking in full sentences with no issues at this time. Pt denies any new pain and does c/o of chronic back pain and does endorse he has not taken his daily pain meds for chronic pain.

## 2022-07-31 NOTE — Assessment & Plan Note (Signed)
Continue Flomax and finasteride

## 2022-07-31 NOTE — Assessment & Plan Note (Signed)
Continue metoprolol for rate control Continue apixaban as primary prophylaxis for an acute stroke

## 2022-07-31 NOTE — ED Triage Notes (Signed)
Pt to ED for shob and chest pressure that started a few days ago. Denies n/v States could not lie flat last night.  States unsure of CHF or COPD hx.

## 2022-07-31 NOTE — Assessment & Plan Note (Signed)
Continue diazepam as needed

## 2022-07-31 NOTE — Assessment & Plan Note (Addendum)
- 

## 2022-07-31 NOTE — Assessment & Plan Note (Addendum)
Status post PCI --cardiology consulted

## 2022-07-31 NOTE — Assessment & Plan Note (Signed)
With chronic low back pain Continue oxycodone

## 2022-07-31 NOTE — Assessment & Plan Note (Signed)
Chronic No evidence of bleeding Monitor closely during this hospitalization

## 2022-07-31 NOTE — ED Provider Notes (Signed)
[facility] Provider Note    Event Date/Time   First MD Initiated Contact with Patient 07/31/22 [phone]     (approximate)   History   Shortness of Breath   HPI  Cody Price is a 81 y.o. male who presents to the ER for evaluation of worsening orthopnea and shortness of breath dyspnea for the past 2 to 3 days.  States that last night he was not able to sleep because he could not lay back without becoming severely short of breath.  He denies any pain.  He is on torsemide as well as Eliquis.  Follows with [facility] cardiology.     Physical Exam   Triage Vital Signs: ED Triage Vitals  Enc Vitals Group     BP 07/31/22 0826 (!) 140/83     Pulse Rate 07/31/22 0826 (!) 112     Resp 07/31/22 0826 (!) 22     Temp 07/31/22 0826 98 F (36.7 C)     Temp src --      SpO2 07/31/22 0826 99 %     Weight 07/31/22 0827 235 lb (106.6 kg)     Height 07/31/22 0827 '6\' 1"'$  (1.854 m)     Head Circumference --      Peak Flow --      Pain Score 07/31/22 0827 5     Pain Loc --      Pain Edu? --      Excl. in [place]? --     Most recent vital signs: Vitals:   07/31/22 1111 07/31/22 1400  BP: (!) 141/86 130/65  Pulse: 98 89  Resp: 20 20  Temp:  98.1 F (36.7 C)  SpO2: 98% 100%     Constitutional: Alert  Eyes: Conjunctivae are normal.  Head: Atraumatic. Nose: No congestion/rhinnorhea. Mouth/Throat: Mucous membranes are moist.   Neck: Painless ROM.  Cardiovascular:   Good peripheral circulation. Respiratory: Mild tachypnea with diminished inspiratory breath sounds posteriorly.  No wheeze. Gastrointestinal: Soft and nontender.  Musculoskeletal:  no deformity, 2+ bilateral lower extremity edema. Neurologic:  MAE spontaneously. No gross focal neurologic deficits are appreciated.  Skin:  Skin is warm, dry and intact. No rash noted. Psychiatric: Mood and affect are normal. Speech and behavior are normal.    ED Results / Procedures / Treatments   Labs (all labs ordered  are listed, but only abnormal results are displayed) Labs Reviewed  BASIC METABOLIC PANEL - Abnormal; Notable for the following components:      Result Value   Glucose, Bld 234 (*)    All other components within normal limits  CBC - Abnormal; Notable for the following components:   RBC 3.31 (*)    Hemoglobin 9.9 (*)    HCT 30.1 (*)    RDW 17.3 (*)    Platelets 85 (*)    All other components within normal limits  BRAIN NATRIURETIC PEPTIDE - Abnormal; Notable for the following components:   B Natriuretic Peptide 729.6 (*)    All other components within normal limits  TROPONIN I (HIGH SENSITIVITY) - Abnormal; Notable for the following components:   Troponin I (High Sensitivity) 49 (*)    All other components within normal limits  TROPONIN I (HIGH SENSITIVITY) - Abnormal; Notable for the following components:   Troponin I (High Sensitivity) 46 (*)    All other components within normal limits     EKG  ED ECG REPORT I, Merlyn Lot, the attending physician, personally viewed and interpreted this  ECG.   Date: 07/31/2022  EKG Time: 8:27  Rate: 105  Rhythm: afib  Axis: normal  Intervals: normal qt  ST&T Change: no stemi, no depressions    RADIOLOGY Please see ED Course for my review and interpretation.  I personally reviewed all radiographic images ordered to evaluate for the above acute complaints and reviewed radiology reports and findings.  These findings were personally discussed with the patient.  Please see medical record for radiology report.    PROCEDURES:  Critical Care performed: No  Procedures   MEDICATIONS ORDERED IN ED: Medications  oxyCODONE-acetaminophen (PERCOCET) 10-325 MG per tablet 1 tablet (has no administration in time range)  atorvastatin (LIPITOR) tablet 40 mg (has no administration in time range)  metoprolol succinate (TOPROL-XL) 24 hr tablet 50 mg (has no administration in time range)  omega-3 acid ethyl esters (LOVAZA) capsule 2 g (has  no administration in time range)  spironolactone (ALDACTONE) tablet 12.5 mg (has no administration in time range)  diazepam (VALIUM) tablet 5 mg (has no administration in time range)  pantoprazole (PROTONIX) EC tablet 40 mg (has no administration in time range)  sucralfate (CARAFATE) tablet 1 g (has no administration in time range)  finasteride (PROSCAR) tablet 5 mg (has no administration in time range)  tamsulosin (FLOMAX) capsule 0.4 mg (has no administration in time range)  apixaban (ELIQUIS) tablet 5 mg (has no administration in time range)  potassium chloride SA (KLOR-CON M) CR tablet 20 mEq (has no administration in time range)  multivitamin with minerals tablet 1 tablet (has no administration in time range)  furosemide (LASIX) injection 40 mg (has no administration in time range)  insulin aspart (novoLOG) injection 0-15 Units (has no administration in time range)  sodium chloride flush (NS) 0.9 % injection 3 mL (has no administration in time range)  sodium chloride flush (NS) 0.9 % injection 3 mL (has no administration in time range)  0.9 %  sodium chloride infusion (has no administration in time range)  ondansetron (ZOFRAN) tablet 4 mg (has no administration in time range)    Or  ondansetron (ZOFRAN) injection 4 mg (has no administration in time range)  furosemide (LASIX) injection 60 mg (60 mg Intravenous Given 07/31/22 1055)  oxyCODONE-acetaminophen (PERCOCET/ROXICET) 5-325 MG per tablet 2 tablet (2 tablets Oral Given 07/31/22 1158)     IMPRESSION / MDM / ASSESSMENT AND PLAN / ED COURSE  I reviewed the triage vital signs and the nursing notes.                              Differential diagnosis includes, but is not limited to, Asthma, copd, CHF, pna, ptx, malignancy, Pe, anemia  Patient presenting to the ER for evaluation of symptoms as described above.  Based on symptoms, risk factors and considered above differential, this presenting complaint could reflect a potentially  life-threatening illness therefore the patient will be placed on continuous pulse oximetry and telemetry for monitoring.  Laboratory evaluation will be sent to evaluate for the above complaints.     Clinical Course as of 07/31/22 1436  Sat Jul 31, 2022  0949 Chest x-ray on my review and interpretation without evidence of consolidation.  Or pneumothorax. [PR]  [phone] Patient is an A-fib.  Have a lower suspicion for ACS.  Mild elevation of troponin will add on BNP.  I am can order IV Lasix as suspect that he is having CHF exacerbation based on his exam. [PR]  1257  Repeat troponin is stable. [PR]  1327 Patient has had significant diuresis after IV Lasix.  He appears well.  Patient does not feel like he is safe to go home feeling that he is still dyspneic and short of breath.  Will consult hospitalist for observation for additional diuresis. [PR]    Clinical Course User Index [PR] Merlyn Lot, MD     FINAL CLINICAL IMPRESSION(S) / ED DIAGNOSES   Final diagnoses:  Acute on chronic diastolic CHF (congestive heart failure) ([place])     Rx / DC Orders   ED Discharge Orders     None        Note:  This document was prepared using Dragon voice recognition software and may include unintentional dictation errors.    Merlyn Lot, MD 07/31/22 1436

## 2022-08-01 DIAGNOSIS — I5033 Acute on chronic diastolic (congestive) heart failure: Secondary | ICD-10-CM | POA: Diagnosis not present

## 2022-08-01 LAB — CBC
HCT: 28.7 % — ABNORMAL LOW (ref 39.0–52.0)
Hemoglobin: 9.3 g/dL — ABNORMAL LOW (ref 13.0–17.0)
MCH: 29.2 pg (ref 26.0–34.0)
MCHC: 32.4 g/dL (ref 30.0–36.0)
MCV: 90.3 fL (ref 80.0–100.0)
Platelets: 79 10*3/uL — ABNORMAL LOW (ref 150–400)
RBC: 3.18 MIL/uL — ABNORMAL LOW (ref 4.22–5.81)
RDW: 17.6 % — ABNORMAL HIGH (ref 11.5–15.5)
WBC: 7 10*3/uL (ref 4.0–10.5)
nRBC: 0 % (ref 0.0–0.2)

## 2022-08-01 LAB — BASIC METABOLIC PANEL
Anion gap: 6 (ref 5–15)
BUN: 10 mg/dL (ref 8–23)
CO2: 27 mmol/L (ref 22–32)
Calcium: 8.6 mg/dL — ABNORMAL LOW (ref 8.9–10.3)
Chloride: 108 mmol/L (ref 98–111)
Creatinine, Ser: 0.71 mg/dL (ref 0.61–1.24)
GFR, Estimated: >60 mL/min (ref >60)
Glucose, Bld: 147 mg/dL — ABNORMAL HIGH (ref 70–99)
Potassium: 3.6 mmol/L (ref 3.5–5.1)
Sodium: 141 mmol/L (ref 135–145)

## 2022-08-01 LAB — CBG MONITORING, ED
Glucose-Capillary: 194 mg/dL — ABNORMAL HIGH (ref 70–99)
Glucose-Capillary: 161 mg/dL — ABNORMAL HIGH (ref 70–99)
Glucose-Capillary: 141 mg/dL — ABNORMAL HIGH (ref 70–99)

## 2022-08-01 MED ORDER — FUROSEMIDE 10 MG/ML IJ SOLN
40.0000 mg | Freq: Two times a day (BID) | INTRAMUSCULAR | Status: DC
  Administered 2022-08-01 – 2022-08-03 (×4): 40 mg via INTRAVENOUS
  Filled 2022-08-01 (×4): qty 4

## 2022-08-01 NOTE — ED Notes (Signed)
Pt eating breakfast. Will pull AM meds once pharm tech done loading pyxis.

## 2022-08-01 NOTE — ED Notes (Signed)
Advised nurse that patient has ready bed 

## 2022-08-01 NOTE — Progress Notes (Signed)
Pt arrived to room 249 via bed from the ED. Received report from [place], Therapist, sports. See assessment. Will continue to monitor.

## 2022-08-01 NOTE — Progress Notes (Signed)
  PROGRESS NOTE    Cody Price  [id number]:[id number] DOB: June 20, 1941 DOA: 07/31/2022 PCP: Baxter Hire, MD  [id number]  LOS: 1 day   Brief hospital course:   Assessment & Plan: Cody Price is a 81 y.o. male with medical history significant for GERD, hypertension, chronic diastolic dysfunction CHF, history of spinal stenosis, coronary artery disease status post stent angioplasty, diabetes mellitus who presents to the ER for evaluation of worsening shortness of breath over the last 1 week. He denies having any dietary indiscretion and has been compliant with his medications.  Denies any significant weight gain.   Acute on chronic heart failure with preserved ejection fraction (HFpEF) ([place]) Last 2D echo showed an echocardiogram of 55 to 60% from 07/23.  Take torsemide at home. --cardiology consulted Plan: Continue Lasix 40 mg IV every 12 Continue metoprolol and spironolactone  Diabetes mellitus without complication (HCC) On oral hypoglycemic agents Hold Amaryl --ACHS and SSI  Thrombocytopenia (HCC) Chronic No evidence of bleeding Monitor closely during this hospitalization   BPH (benign prostatic hyperplasia) Continue Flomax and finasteride  Hypertension Continue metoprolol and aldactone  Anxiety Continue diazepam as needed   Elevated troponin I level Most likely secondary to demand ischemia from acute on chronic CHF with preserved EF. --trop 40's flat  Coronary artery disease involving native coronary artery of native heart without angina pectoris Status post PCI --cardiology consulted  Spinal stenosis With chronic low back pain Continue oxycodone  ATRIAL FIBRILLATION Continue metoprolol for rate control Continue apixaban as primary prophylaxis for an acute stroke   DVT prophylaxis: KZ:[id number] Code Status: DNR  Family Communication:  Level of care: Progressive Dispo:   The patient is from: home Anticipated d/c is to: home Anticipated d/c date is: 1-2  days   Subjective and Interval History:  Pt reported chest pressure.  Having good urine output.   Objective: Vitals:   08/01/22 1030 08/01/22 1330 08/01/22 1430 08/01/22 1432  BP: 123/63  104/66   Pulse: 71 75 83   Resp: 17 (!) 21 19   Temp:    97.6 F (36.4 C)  TempSrc:    Oral  SpO2: 98% 96% 97%   Weight:      Height:        Intake/Output Summary (Last 24 hours) at 08/01/2022 1649 Last data filed at 08/01/2022 3557 Gross per 24 hour  Intake --  Output 1800 ml  Net -1800 ml   Filed Weights   07/31/22 0827 07/31/22 1111  Weight: 106.6 kg 107.7 kg    Examination:   Constitutional: NAD, AAOx3 HEENT: conjunctivae and lids normal, EOMI CV: No cyanosis.   RESP: normal respiratory effort, on RA Neuro: II - XII grossly intact.   Psych: Normal mood and affect.  Appropriate judgement and reason   Data Reviewed: I have personally reviewed labs and imaging studies  Time spent: 50 minutes  Enzo Bi, MD [facility] If 7PM-7AM, please contact night-coverage 08/01/2022, 4:49 PM

## 2022-08-01 NOTE — Consult Note (Signed)
[facility] Consultation Note  Patient ID: Cody Price, MRN: [id number], DOB/AGE: 1941/04/06 81 y.o. Admit date: 07/31/2022   Date of Consult: 08/01/2022 Primary Physician: Baxter Hire, MD Primary Cardiologist: [facility]  Chief Complaint:  Chief Complaint  Patient presents with   Shortness of Breath   Reason for Consult:Heart failure  HPI: 81 y.o. male with known coronary artery disease hypertension hyperlipidemia and diabetes who has had a previous echocardiogram showing normal LV systolic function and no evidence of significant valvular heart disease.  The patient has been maintaining with medication management including torsemide for heart failure symptoms but has had progression of lower extremity edema pulmonary edema PND and orthopnea.  Currently there is no evidence of myocardial infarction with minimal elevation of troponin at 48.  The patient does have a intravenous Lasix for which she has had some improvements of symptoms  Past Medical History:  Diagnosis Date   Arthritis    degenerative back    BPH with obstruction/lower urinary tract symptoms    CAD (coronary artery disease) 2010   stent   Diabetes mellitus    ED (erectile dysfunction)    Elevated PSA    GERD (gastroesophageal reflux disease)    Gross hematuria    H/O CT scan    recent renal CT due to hematuria, cystoscopy - normal     Hematuria    History of hiatal hernia    History of kidney stones    HLD (hyperlipidemia)    Hypertension    Ischemic heart disease 1991   with angioplasty   Kidney stone    Morbid obesity ([place])    Myocardial infarction ([place])    Peripheral neuropathy    Pernicious anemia    Pneumonia 1995   /w PE,    Pulmonary embolism ([place])    previous   Renal cyst    Sleep apnea    pt. denies sleep apnea, pt. states he has had 2 studies - last one 2011   Spinal stenosis       Surgical History:  Past Surgical History:  Procedure Laterality Date   APPENDECTOMY      BACK SURGERY     x3 back surgeries - /w fusioin, 1- [place] 2- [facility]   bilateral inguinal hernia repair     cad     stent 2010   CARDIAC CATHETERIZATION  08/17/2014   CARPAL TUNNEL RELEASE  2014   bilateral    CHOLECYSTECTOMY     COLONOSCOPY WITH PROPOFOL N/A 07/11/2019   Procedure: COLONOSCOPY WITH PROPOFOL;  Surgeon: Toledo, Benay Pike, MD;  Location: [facility] ENDOSCOPY;  Service: Gastroenterology;  Laterality: N/A;   CORONARY BALLOON ANGIOPLASTY N/A 07/11/2020   Procedure: CORONARY BALLOON ANGIOPLASTY;  Surgeon: Isaias Cowman, MD;  Location: [place] [facility];  Service: Cardiovascular;  Laterality: N/A;   CORONARY STENT INTERVENTION N/A 06/07/2018   Procedure: CORONARY STENT INTERVENTION;  Surgeon: Nelva Bush, MD;  Location: [place] [facility];  Service: Cardiovascular;  Laterality: N/A;   CORONARY STENT INTERVENTION N/A 07/11/2020   Procedure: CORONARY STENT INTERVENTION;  Surgeon: Isaias Cowman, MD;  Location: [place] [facility];  Service: Cardiovascular;  Laterality: N/A;   ESOPHAGOGASTRODUODENOSCOPY (EGD) WITH PROPOFOL N/A 07/11/2019   Procedure: ESOPHAGOGASTRODUODENOSCOPY (EGD) WITH PROPOFOL;  Surgeon: Toledo, Benay Pike, MD;  Location: [facility] ENDOSCOPY;  Service: Gastroenterology;  Laterality: N/A;   ESOPHAGOGASTRODUODENOSCOPY (EGD) WITH PROPOFOL N/A 03/24/2022   Procedure: ESOPHAGOGASTRODUODENOSCOPY (EGD) WITH PROPOFOL;  Surgeon: Annamaria Helling, DO;  Location: [facility]  ENDOSCOPY;  Service: Gastroenterology;  Laterality: N/A;   LAMINECTOMY     LEFT HEART CATH AND CORONARY ANGIOGRAPHY N/A 06/07/2018   Procedure: LEFT HEART CATH AND CORONARY ANGIOGRAPHY;  Surgeon: Nelva Bush, MD;  Location: [place] [facility];  Service: Cardiovascular;  Laterality: N/A;   LEFT HEART CATH AND CORONARY ANGIOGRAPHY N/A 08/26/2021   Procedure: LEFT HEART CATH AND CORONARY ANGIOGRAPHY;  Surgeon: Wellington Hampshire, MD;  Location: [place] [facility];  Service: Cardiovascular;   Laterality: N/A;   RIGHT/LEFT HEART CATH AND CORONARY ANGIOGRAPHY N/A 07/11/2020   Procedure: RIGHT/LEFT HEART CATH AND CORONARY ANGIOGRAPHY;  Surgeon: Minna Merritts, MD;  Location: [place] [facility];  Service: Cardiovascular;  Laterality: N/A;   RIGHT/LEFT HEART CATH AND CORONARY ANGIOGRAPHY N/A 03/26/2022   Procedure: RIGHT/LEFT HEART CATH AND CORONARY ANGIOGRAPHY;  Surgeon: Wellington Hampshire, MD;  Location: [place] [facility];  Service: Cardiovascular;  Laterality: N/A;   TEE WITHOUT CARDIOVERSION N/A 03/30/2022   Procedure: TRANSESOPHAGEAL ECHOCARDIOGRAM (TEE);  Surgeon: Kate Sable, MD;  Location: [facility] ORS;  Service: Cardiovascular;  Laterality: N/A;     Home Meds: Prior to Admission medications   Medication Sig Start Date End Date Taking? Authorizing Provider  allopurinol (ZYLOPRIM) 100 MG tablet Take 100 mg by mouth daily. 06/05/22  Yes [provider]  atorvastatin (LIPITOR) 40 MG tablet TAKE 1 TABLET BY MOUTH DAILY AT 6 PM. Patient taking differently: Take 40 mg by mouth daily. TAKE 1 TABLET BY MOUTH DAILY AT 6 PM. 05/24/22  Yes Gollan, Kathlene November, MD  Coenzyme Q10 (CO Q-10) 200 MG CAPS Take 200 mg by mouth daily.   Yes [provider]  ELIQUIS 5 MG TABS tablet TAKE 1 TABLET BY MOUTH TWICE A DAY Patient taking differently: Take 5 mg by mouth 2 (two) times daily. 05/11/22  Yes Gollan, Kathlene November, MD  finasteride (PROSCAR) 5 MG tablet TAKE 1 TABLET BY MOUTH EVERY DAY Patient taking differently: Take 5 mg by mouth daily. 12/01/20  Yes McGowan, Larene Beach A, PA-C  glimepiride (AMARYL) 4 MG tablet Take 1 tablet (4 mg total) by mouth 2 (two) times daily. Hold this medication until you see your PCP as you were started on farxiga as per cardio 03/31/22  Yes Wyvonnia Dusky, MD  KLOR-CON M20 20 MEQ tablet Take 20 mEq by mouth daily. 12/31/21  Yes [provider]  methocarbamol (ROBAXIN) 500 MG tablet Take 1 tablet (500 mg total) by mouth every 8 (eight) hours as  needed for muscle spasms. 09/29/21  Yes Carrie Mew, MD  metoprolol succinate (TOPROL-XL) 50 MG 24 hr tablet TAKE 1 TABLET BY MOUTH EVERY DAY WITH OR IMMEDIATELY FOLLOWING A MEAL Patient taking differently: Take 50 mg by mouth daily. 05/24/22  Yes Gollan, Kathlene November, MD  Multiple Vitamin (MULTIVITAMIN WITH MINERALS) TABS tablet Take 1 tablet by mouth daily. 06/08/18  Yes Wieting, Richard, MD  omega-3 acid ethyl esters (LOVAZA) 1 g capsule Take 2 g by mouth daily.   Yes [provider]  omeprazole (PRILOSEC) 40 MG capsule Take 40 mg by mouth 2 (two) times daily. 06/07/19  Yes [provider]  oxyCODONE (OXY IR/ROXICODONE) 5 MG immediate release tablet Take 5 mg by mouth 4 (four) times daily as needed. 03/22/22  Yes [provider]  oxyCODONE-acetaminophen (PERCOCET) 10-325 MG tablet Take 1 tablet by mouth every 4 (four) hours as needed for pain.    Yes [provider]  spironolactone (ALDACTONE) 25 MG tablet Take 0.5 tablets (  12.5 mg total) by mouth daily. 03/31/22 07/31/22 Yes Wyvonnia Dusky, MD  sucralfate (CARAFATE) 1 g tablet Take 1 g by mouth 4 (four) times daily. 03/13/22  Yes [provider]  tamsulosin (FLOMAX) 0.4 MG CAPS capsule TAKE 1 CAPSULE BY MOUTH EVERY DAY 12/01/20  Yes McGowan, Larene Beach A, PA-C  torsemide (DEMADEX) 20 MG tablet Take 20 mg by mouth daily. 04/27/22  Yes [provider]  acetaminophen (TYLENOL) 500 MG tablet Take 500 mg by mouth as needed.     [provider]  cyanocobalamin (,VITAMIN B-12,) 1000 MCG/ML injection Inject 1,000 mcg into the muscle every 30 (thirty) days. 09/12/19   [provider]  diazepam (VALIUM) 5 MG tablet Take 5 mg by mouth 3 (three) times daily as needed for anxiety or muscle spasms. Patient not taking: Reported on 07/31/2022    [provider]  fluticasone (FLONASE) 50 MCG/ACT nasal spray Place 2 sprays into both nostrils daily. 08/29/21 11/27/21  [place] Donnamarie Poag, DO   metoprolol succinate (TOPROL-XL) 100 MG 24 hr tablet Take 0.5 tablets (50 mg total) by mouth daily. Take with or immediately following a meal. 04/08/22 05/08/22  Furth, Cadence H, PA-C  sildenafil (VIAGRA) 100 MG tablet Take 100 mg by mouth daily as needed for erectile dysfunction.    [provider]  testosterone cypionate (DEPOTESTOSTERONE CYPIONATE) 200 MG/ML injection Inject 200 mg into the muscle every 28 (twenty-eight) days.    [provider]    Inpatient Medications:   apixaban  5 mg Oral BID   atorvastatin  40 mg Oral q1800   finasteride  5 mg Oral Daily   furosemide  40 mg Intravenous BID   insulin aspart  0-15 Units Subcutaneous TID WC   metoprolol succinate  50 mg Oral Daily   multivitamin with minerals  1 tablet Oral Daily   omega-3 acid ethyl esters  2 g Oral Daily   pantoprazole  40 mg Oral Daily   potassium chloride SA  20 mEq Oral Daily   sodium chloride flush  3 mL Intravenous Q12H   spironolactone  12.5 mg Oral Daily   sucralfate  1 g Oral QID   tamsulosin  0.4 mg Oral q1800    sodium chloride      Allergies:  Allergies  Allergen Reactions   Novocain [Procaine] Other (See Comments)    Syncope   Procaine Hcl Other (See Comments)    Passes out   Lyrica [Pregabalin]    Neurontin [Gabapentin]    Amitriptyline Other (See Comments)    Sleepy   Dicyclomine Other (See Comments)    Other reaction(s): Abdominal Pain   Metformin Nausea Only   Nitroglycerin Other (See Comments)    Makes Blood pressure go to low.     Social History   Socioeconomic History   Marital status: Married    Spouse name: Not on file   Number of children: Not on file   Years of education: Not on file   Highest education level: Not on file  Occupational History   Not on file  Tobacco Use   Smoking status: Former    Packs/day: 3.00    Years: 15.00    Total pack years: 45.00    Types: Cigarettes    Quit date: 10/18/1985    Years since quitting: 36.8   Smokeless  tobacco: Former   Tobacco comments:    tobacco use- no   Vaping Use   Vaping Use: Never used  Substance and Sexual  Activity   Alcohol use: Not Currently    Comment: rare   Drug use: No   Sexual activity: Not on file  Other Topics Concern   Not on file  Social History Narrative   Walks regularly. Retired.    Social Determinants of Health   Financial Resource Strain: Not on file  Food Insecurity: Not on file  Transportation Needs: Not on file  Physical Activity: Not on file  Stress: Not on file  Social Connections: Not on file  Intimate Partner Violence: Not on file     Family History  Problem Relation Age of Onset   Heart failure Mother    Heart disease Father    Skin cancer Sister    Heart attack Brother    Kidney disease Neg Hx    Prostate cancer Neg Hx    Kidney cancer Neg Hx    Bladder Cancer Neg Hx      Review of Systems Positive for shortness of breath PND orthopnea Negative for: General:  chills, fever, night sweats or weight changes.  Cardiovascular: Positive for PND orthopnea negative for syncope dizziness  Dermatological skin lesions rashes Respiratory: Cough congestion Urologic: Frequent urination urination at night and hematuria Abdominal: negative for nausea, vomiting, diarrhea, bright red blood per rectum, melena, or hematemesis Neurologic: negative for visual changes, and/or hearing changes  All other systems reviewed and are otherwise negative except as noted above.  Labs: No results for input(s): "CKTOTAL", "CKMB", "TROPONINI" in the last 72 hours. Lab Results  Component Value Date   WBC 7.0 08/01/2022   HGB 9.3 (L) 08/01/2022   HCT 28.7 (L) 08/01/2022   MCV 90.3 08/01/2022   PLT 79 (L) 08/01/2022    Recent Labs  Lab 08/01/22 0527  NA 141  K 3.6  CL 108  CO2 27  BUN 10  CREATININE 0.71  CALCIUM 8.6*  GLUCOSE 147*   Lab Results  Component Value Date   CHOL 62 03/22/2022   HDL 20 (L) 03/22/2022   LDLCALC 27 03/22/2022   TRIG  75 03/22/2022   Lab Results  Component Value Date   DDIMER 0.50 08/26/2021    Radiology/Studies:  DG Chest 2 View  Result Date: 07/31/2022 CLINICAL DATA:  Shortness of breath and chest pressure. EXAM: CHEST - 2 VIEW COMPARISON:  03/22/2022 FINDINGS: Diffuse interstitial opacity suggests edema. Basilar atelectasis noted left greater than right. Cardiopericardial silhouette is at upper limits of normal for size. The visualized bony structures of the thorax are unremarkable. IMPRESSION: Diffuse interstitial opacity suggests edema. Electronically Signed   By: Misty Stanley M.D.   On: 07/31/2022 09:06    EKG: Normal sinus rhythm  Weights: Filed Weights   07/31/22 0827 07/31/22 1111  Weight: 106.6 kg 107.7 kg     Physical Exam: Blood pressure 104/66, pulse 83, temperature 97.6 F (36.4 C), temperature source Oral, resp. rate 19, height '6\' 1"'$  (1.854 m), weight 107.7 kg, SpO2 97 %. Body mass index is 31.32 kg/m. General: Well developed, well nourished, in no acute distress. Head eyes ears nose throat: Normocephalic, atraumatic, sclera non-icteric, no xanthomas, nares are without discharge. No apparent thyromegaly and/or mass  Lungs: Normal respiratory effort.  no wheezes, basilar rales, no rhonchi.  Heart: RRR with normal S1 S2. no murmur gallop, no rub, PMI is normal size and placement, carotid upstroke normal without bruit, jugular venous pressure is normal Abdomen: Soft, non-tender, non-distended with normoactive bowel sounds. No hepatomegaly. No rebound/guarding. No obvious abdominal masses. Abdominal aorta is normal  size without bruit Extremities: Trace to 1+ edema. no cyanosis, no clubbing, no ulcers  Peripheral : 2+ bilateral upper extremity pulses, 2+ bilateral femoral pulses, 2+ bilateral dorsal pedal pulse Neuro: Alert and oriented. No facial asymmetry. No focal deficit. Moves all extremities spontaneously. Musculoskeletal: Normal muscle tone without kyphosis Psych:  Responds to  questions appropriately with a normal affect.    Assessment: 81 year old male with known coronary artery disease hypertension hyperlipidemia diabetes and acute on chronic diastolic dysfunction congestive heart failure without evidence of myocardial infarction  Plan: 1.  Continuation of supportive care with hypoxia with oxygenation 2.  Continue intravenous diuresis for pulmonary edema lower extremity edema and diastolic dysfunction heart failure 3.  Beta-blocker ACE inhibitor as able for hypertension and heart rate control with heart failure 4.  No further cardiac diagnostics necessary at this time 5.  Further treatment options as improved as per above  Signed, Corey Skains M.D. [facility] 08/01/2022, 5:16 PM

## 2022-08-02 ENCOUNTER — Encounter: Payer: Self-pay | Admitting: Internal Medicine

## 2022-08-02 DIAGNOSIS — I5033 Acute on chronic diastolic (congestive) heart failure: Secondary | ICD-10-CM | POA: Diagnosis not present

## 2022-08-02 LAB — BASIC METABOLIC PANEL
Anion gap: 7 (ref 5–15)
BUN: 12 mg/dL (ref 8–23)
CO2: 27 mmol/L (ref 22–32)
Calcium: 8.8 mg/dL — ABNORMAL LOW (ref 8.9–10.3)
Chloride: 106 mmol/L (ref 98–111)
Creatinine, Ser: 0.86 mg/dL (ref 0.61–1.24)
GFR, Estimated: >60 mL/min (ref >60)
Glucose, Bld: 150 mg/dL — ABNORMAL HIGH (ref 70–99)
Potassium: 3.6 mmol/L (ref 3.5–5.1)
Sodium: 140 mmol/L (ref 135–145)

## 2022-08-02 LAB — CBC
HCT: 28.7 % — ABNORMAL LOW (ref 39.0–52.0)
Hemoglobin: 9.5 g/dL — ABNORMAL LOW (ref 13.0–17.0)
MCH: 30 pg (ref 26.0–34.0)
MCHC: 33.1 g/dL (ref 30.0–36.0)
MCV: 90.5 fL (ref 80.0–100.0)
Platelets: 78 10*3/uL — ABNORMAL LOW (ref 150–400)
RBC: 3.17 MIL/uL — ABNORMAL LOW (ref 4.22–5.81)
RDW: 17.7 % — ABNORMAL HIGH (ref 11.5–15.5)
WBC: 8 10*3/uL (ref 4.0–10.5)
nRBC: 0 % (ref 0.0–0.2)

## 2022-08-02 LAB — GLUCOSE, CAPILLARY
Glucose-Capillary: 149 mg/dL — ABNORMAL HIGH (ref 70–99)
Glucose-Capillary: 213 mg/dL — ABNORMAL HIGH (ref 70–99)
Glucose-Capillary: 199 mg/dL — ABNORMAL HIGH (ref 70–99)

## 2022-08-02 LAB — MAGNESIUM: Magnesium: 1.7 mg/dL (ref 1.7–2.4)

## 2022-08-02 NOTE — Discharge Instructions (Signed)

## 2022-08-02 NOTE — Progress Notes (Signed)
  PROGRESS NOTE    Cody Price  [id number]:[id number] DOB: 1941-05-22 DOA: 07/31/2022 PCP: Baxter Hire, MD  [id number]/[id number]  LOS: 2 days   Brief hospital course:   Assessment & Plan: Cody Price is a 81 y.o. male with medical history significant for GERD, hypertension, chronic diastolic dysfunction CHF, history of spinal stenosis, coronary artery disease status post stent angioplasty, diabetes mellitus who presents to the ER for evaluation of worsening shortness of breath over the last 1 week. He denies having any dietary indiscretion and has been compliant with his medications.  Denies any significant weight gain.   Acute on chronic heart failure with preserved ejection fraction (HFpEF) ([place]) Last 2D echo showed an echocardiogram of 55 to 60% from 07/23.  Take torsemide at home. --cardiology consulted Plan: --cont IV lasix 40 BID Continue metoprolol and spironolactone  Diabetes mellitus without complication (HCC) On oral hypoglycemic agents Hold Amaryl --ACHS and SSI  Thrombocytopenia (HCC) Chronic No evidence of bleeding Monitor closely during this hospitalization   BPH (benign prostatic hyperplasia) Continue Flomax and finasteride  Hypertension Continue metoprolol and aldactone  Anxiety Continue diazepam as needed   Elevated troponin I level Most likely secondary to demand ischemia from acute on chronic CHF with preserved EF. --trop 40's flat  Coronary artery disease involving native coronary artery of native heart without angina pectoris Status post PCI --cardiology consulted  Spinal stenosis With chronic low back pain Continue oxycodone  ATRIAL FIBRILLATION Continue metoprolol for rate control Continue apixaban as primary prophylaxis for an acute stroke   DVT prophylaxis: PR:FFMBWGY Code Status: DNR  Family Communication:  Level of care: Progressive Dispo:   The patient is from: home Anticipated d/c is to: home Anticipated d/c date is: 1-2  days   Subjective and Interval History:  Pt reported chest pressure improved.  Having good urine output.   Objective: Vitals:   08/02/22 0507 08/02/22 0741 08/02/22 1158 08/02/22 1535  BP: (!) 126/90 123/65 110/60 109/61  Pulse: 88 82 91 83  Resp: '20 18 19   '$ Temp: 97.8 F (36.6 C) 97.8 F (36.6 C) 97.9 F (36.6 C)   TempSrc: Oral Oral Oral   SpO2: 96% 96% 91% 98%  Weight:      Height:        Intake/Output Summary (Last 24 hours) at 08/02/2022 1749 Last data filed at 08/02/2022 1427 Gross per 24 hour  Intake 600 ml  Output 2050 ml  Net -1450 ml   Filed Weights   07/31/22 0827 07/31/22 1111  Weight: 106.6 kg 107.7 kg    Examination:   Constitutional: NAD, AAOx3 HEENT: conjunctivae and lids normal, EOMI CV: No cyanosis.   RESP: normal respiratory effort, on RA Extremities: No effusions, edema in BLE SKIN: warm, dry Neuro: II - XII grossly intact.   Psych: Normal mood and affect.  Appropriate judgement and reason   Data Reviewed: I have personally reviewed labs and imaging studies  Time spent: 35 minutes  Enzo Bi, MD [facility] If 7PM-7AM, please contact night-coverage 08/02/2022, 5:49 PM

## 2022-08-02 NOTE — Consult Note (Signed)
   Heart Failure Nurse Navigator Note  HFmrEF 50-55%.  Wall motion abnormality noted.  Moderate left ventricular hypertrophy.  Mild biatrial enlargement.  Mild to moderate mitral regurgitation.  Mild to moderate tricuspid regurgitation.  He presented to the emergency room with complaints of shortness of breath worsening lower extremity edema, PND and orthopnea for 1 week.  Chest x-ray revealed pulmonary edema BNP 729.  Comorbidities:  GERD Hypertension Coronary artery disease standers post stent PCI Diabetes  Medications:  Icthaband 5 mg 2 times a day Atorvastatin 40 mg daily Furosemide 40 mg 2 times a day Metoprolol succinate 50 mg daily Potassium chloride 20 mEq daily Spironolactone 12 and half milligrams daily  Labs:  Sodium 140, potassium 3.6, chloride 106, CO2 27, BUN 12, creatinine 0.86, estimated GFR greater than 60 Weight not documented Intake not documented Output 1800 mL Blood pressure 110/60  Initial meeting with the patient, he is sitting up on the edge of the bed in no acute distress.  No family members present.  Patient states that he lives at home with his wife.  He states that he has a scale but does not weigh himself on a daily basis.  Explained the reasoning behind daily weights and what to report.  Also discussed fluid restriction, patient states that he can drink a gallon of water daily.  Went over fluid restriction of no more than 64 ounces and what constitutes a liquid.  Is compliant with his medications.  He also states that very infrequently will go to restaurants..  He does admit to making frozen biscuits at home or biscuits from the canned.  Encouraged to read the labels for sodium content and seeing that he does not go over 2000 mg daily.  Dates that they use fresh or frozen vegetables.  He states very rarely he will have a cup of cream of mushroom soup, explained the sodium content in them.  Also made aware of his follow-up appointment in the  outpatient heart failure clinic for which she has an appointment on December 6 at 3:30 in the afternoon.  Patient states that he has his cardiologist and even has his private phone number.  Still encouraged to follow-up in the outpatient heart failure clinic.  He was given living with heart failure teaching booklet, zone magnet, info on low-sodium and heart failure along with weight chart.  He had no further questions.  Pricilla Riffle RN [facility]

## 2022-08-02 NOTE — Progress Notes (Signed)
[facility]    SUBJECTIVE: Patient still has some shortness of breath PND orthopnea mild leg edema denies any chest pain does not feel like he is improved totally but is no worse than when he was admitted   Vitals:   08/01/22 2003 08/02/22 0000 08/02/22 0507 08/02/22 0741  BP: 111/70 (!) 109/56 (!) 126/90 123/65  Pulse: 77 74 88 82  Resp: '20 18 20 18  '$ Temp: 98 F (36.7 C) 97.6 F (36.4 C) 97.8 F (36.6 C) 97.8 F (36.6 C)  TempSrc: Oral Oral Oral Oral  SpO2: 97% 95% 96% 96%  Weight:      Height:         Intake/Output Summary (Last 24 hours) at 08/02/2022 0930 Last data filed at 08/02/2022 0500 Gross per 24 hour  Intake --  Output 700 ml  Net -700 ml      PHYSICAL EXAM  General: Well developed, well nourished, in no acute distress HEENT:  Normocephalic and atramatic Neck:  No JVD.  Lungs: Clear bilaterally to auscultation and percussion. Heart: HRRR . Normal S1 and S2 without gallops or murmurs.  Abdomen: Bowel sounds are positive, abdomen soft and non-tender  Msk:  Back normal, normal gait. Normal strength and tone for age. Extremities: No clubbing, cyanosis or edema.   Neuro: Alert and oriented X 3. Psych:  Good affect, responds appropriately   LABS: Basic Metabolic Panel: Recent Labs    08/01/22 0527 08/02/22 0502  NA 141 140  K 3.6 3.6  CL 108 106  CO2 27 27  GLUCOSE 147* 150*  BUN 10 12  CREATININE 0.71 0.86  CALCIUM 8.6* 8.8*  MG  --  1.7   Liver Function Tests: No results for input(s): "AST", "ALT", "ALKPHOS", "BILITOT", "PROT", "ALBUMIN" in the last 72 hours. No results for input(s): "LIPASE", "AMYLASE" in the last 72 hours. CBC: Recent Labs    08/01/22 0527 08/02/22 0502  WBC 7.0 8.0  HGB 9.3* 9.5*  HCT 28.7* 28.7*  MCV 90.3 90.5  PLT 79* 78*   Cardiac Enzymes: No results for input(s): "CKTOTAL", "CKMB", "CKMBINDEX", "TROPONINI" in the last 72 hours. BNP: Invalid input(s): "POCBNP" D-Dimer: No results for input(s): "DDIMER"  in the last 72 hours. Hemoglobin A1C: No results for input(s): "HGBA1C" in the last 72 hours. Fasting Lipid Panel: No results for input(s): "CHOL", "HDL", "LDLCALC", "TRIG", "CHOLHDL", "LDLDIRECT" in the last 72 hours. Thyroid Function Tests: No results for input(s): "TSH", "T4TOTAL", "T3FREE", "THYROIDAB" in the last 72 hours.  Invalid input(s): "FREET3" Anemia Panel: No results for input(s): "VITAMINB12", "FOLATE", "FERRITIN", "TIBC", "IRON", "RETICCTPCT" in the last 72 hours.  No results found.   Echo   TELEMETRY: Atrial fibrillation rate of 70 nonspecific ST-T wave changes:  ASSESSMENT AND PLAN:  Active Problems:   ATRIAL FIBRILLATION   Thrombocytopenia (HCC)   BPH (benign prostatic hyperplasia)   Hypertension   Spinal stenosis   Acute on chronic heart failure with preserved ejection fraction (HFpEF) (HCC)   Coronary artery disease involving native coronary artery of native heart without angina pectoris   Diabetes mellitus without complication (HCC)   Anxiety   Elevated troponin I level    Plan Continue diuresis for acute on chronic diastolic congestive heart failure Maintain anticoagulation for atrial fibrillation Agree with continued rate control for A-fib Continue antihypertensive medications Diabetes appears to be reasonably stable Recommend increase activity have the patient ambulate Elevated troponins more consistent with demand ischemia than a non-STEMI Continue conservative cardiac therapy for heart failure  Yolonda Kida, MD, 08/02/2022 9:30 AM

## 2022-08-02 NOTE — Progress Notes (Signed)
Attempted to call daughter back for update; unable to leave vm and no answer at this time

## 2022-08-03 DIAGNOSIS — I5033 Acute on chronic diastolic (congestive) heart failure: Secondary | ICD-10-CM | POA: Diagnosis not present

## 2022-08-03 LAB — CBC
HCT: 28.1 % — ABNORMAL LOW (ref 39.0–52.0)
Hemoglobin: 9.3 g/dL — ABNORMAL LOW (ref 13.0–17.0)
MCH: 29.8 pg (ref 26.0–34.0)
MCHC: 33.1 g/dL (ref 30.0–36.0)
MCV: 90.1 fL (ref 80.0–100.0)
Platelets: 75 10*3/uL — ABNORMAL LOW (ref 150–400)
RBC: 3.12 MIL/uL — ABNORMAL LOW (ref 4.22–5.81)
RDW: 18.1 % — ABNORMAL HIGH (ref 11.5–15.5)
WBC: 7.9 10*3/uL (ref 4.0–10.5)
nRBC: 0 % (ref 0.0–0.2)

## 2022-08-03 LAB — BASIC METABOLIC PANEL
Anion gap: 6 (ref 5–15)
BUN: 15 mg/dL (ref 8–23)
CO2: 27 mmol/L (ref 22–32)
Calcium: 8.2 mg/dL — ABNORMAL LOW (ref 8.9–10.3)
Chloride: 105 mmol/L (ref 98–111)
Creatinine, Ser: 0.84 mg/dL (ref 0.61–1.24)
GFR, Estimated: >60 mL/min (ref >60)
Glucose, Bld: 191 mg/dL — ABNORMAL HIGH (ref 70–99)
Potassium: 3.5 mmol/L (ref 3.5–5.1)
Sodium: 138 mmol/L (ref 135–145)

## 2022-08-03 LAB — MAGNESIUM: Magnesium: 1.7 mg/dL (ref 1.7–2.4)

## 2022-08-03 LAB — GLUCOSE, CAPILLARY
Glucose-Capillary: 216 mg/dL — ABNORMAL HIGH (ref 70–99)
Glucose-Capillary: 218 mg/dL — ABNORMAL HIGH (ref 70–99)
Glucose-Capillary: 224 mg/dL — ABNORMAL HIGH (ref 70–99)
Glucose-Capillary: 151 mg/dL — ABNORMAL HIGH (ref 70–99)

## 2022-08-03 MED ORDER — TORSEMIDE 20 MG PO TABS: 60.0000 mg | ORAL_TABLET | Freq: Every day | ORAL | Status: DC

## 2022-08-03 MED ORDER — MELATONIN 5 MG PO TABS
5.0000 mg | ORAL_TABLET | Freq: Once | ORAL | Status: AC
  Administered 2022-08-03: 5 mg via ORAL
  Filled 2022-08-03: qty 1

## 2022-08-03 MED ORDER — FUROSEMIDE 10 MG/ML IJ SOLN
40.0000 mg | Freq: Two times a day (BID) | INTRAMUSCULAR | Status: AC
  Administered 2022-08-03: 40 mg via INTRAVENOUS
  Filled 2022-08-03: qty 4

## 2022-08-03 MED ORDER — FUROSEMIDE 10 MG/ML IJ SOLN
40.0000 mg | Freq: Once | INTRAMUSCULAR | Status: AC
  Administered 2022-08-04: 40 mg via INTRAVENOUS
  Filled 2022-08-03: qty 4

## 2022-08-03 MED ORDER — ALLOPURINOL 100 MG PO TABS
100.0000 mg | ORAL_TABLET | Freq: Every day | ORAL | Status: DC
  Administered 2022-08-03 – 2022-08-04 (×2): 100 mg via ORAL
  Filled 2022-08-03 (×2): qty 1

## 2022-08-03 NOTE — TOC Initial Note (Signed)
Transition of Care [facility]) - Initial/Assessment Note    Patient Details  Name: Cody Price MRN: [id number] Date of Birth: November 21, 1940  Transition of Care [facility]) CM/SW Contact:    Cody Bash, LCSW Phone Number: 08/03/2022, 9:17 AM  Clinical Narrative:                  Patient with recent readmission risk assessment per chart review:  PCP is Harrel Lemon, MD. Patient drives himself to appointments.   [facility]  His nephew Cody Price will likely transport him home at discharge.   Patient with hx of [facility] PT OT and RN if recommended at time of discharge.   Expected Discharge Plan: [place] Barriers to Discharge: Continued Medical Work up   Patient Goals and CMS Choice Patient states their goals for this hospitalization and ongoing recovery are:: to go home CMS Medicare.gov Compare Post Acute Care list provided to:: Patient Choice offered to / list presented to : Patient  Expected Discharge Plan and Services Expected Discharge Plan: [place]       Living arrangements for the past 2 months: Single Family Home                                      Prior Living Arrangements/Services Living arrangements for the past 2 months: Single Family Home                     Activities of Daily Living Home Assistive Devices/Equipment: Eyeglasses ADL Screening (condition at time of admission) Patient's cognitive ability adequate to safely complete daily activities?: Yes Is the patient deaf or have difficulty hearing?: No Does the patient have difficulty seeing, even when wearing glasses/contacts?: No Does the patient have difficulty concentrating, remembering, or making decisions?: No Patient able to express need for assistance with ADLs?: Yes Does the patient have difficulty dressing or bathing?: No Independently performs ADLs?: Yes (appropriate for developmental age) Does the patient have difficulty walking or  climbing stairs?: Yes Weakness of Legs: Both Weakness of Arms/Hands: None  Permission Sought/Granted                  Emotional Assessment              Admission diagnosis:  Acute on chronic diastolic CHF (congestive heart failure) ([place]) [I50.33] (HFpEF) heart failure with preserved ejection fraction ([place]) [I50.30] Patient Active Problem List   Diagnosis Date Noted   (HFpEF) heart failure with preserved ejection fraction ([place]) 07/31/2022   Elevated troponin I level 07/31/2022   Demand ischemia    Acute combined systolic and diastolic heart failure (HCC)    Microcytic anemia    HLD (hyperlipidemia)    Atrial fibrillation, chronic (HCC)    Diabetes mellitus without complication (HCC)    Anxiety    Chronic diastolic CHF (congestive heart failure) ([place])    Coronary artery disease involving native coronary artery of native heart without angina pectoris    Acute on chronic heart failure with preserved ejection fraction (HFpEF) ([place]) 07/08/2020   Atherosclerosis of native coronary artery of native heart with stable angina pectoris ([place]) 06/12/2018   Chest pain    Weakness 02/13/2018   Chronic postoperative pain 04/13/2017   Lumbar post-laminectomy syndrome 04/13/2017   Antiplatelet or antithrombotic long-term use 04/11/2017   Neuropathy 09/21/2016   Chronic, continuous use of opioids  05/27/2016   Erectile dysfunction 01/01/2016   BPH (benign prostatic hyperplasia) 01/01/2016   Pneumonia 11/03/2015   Nocturia 10/02/2015   Erectile dysfunction of organic origin 10/02/2015   Angina pectoris ([place]) 09/23/2015   Thrombocytopenia ([place]) 09/23/2015   B12 deficiency 05/26/2015   Normocytic anemia 05/08/2014   Diabetes mellitus type 2, uncomplicated ([place]) 35/24/8185   DDD (degenerative disc disease) 05/08/2014   Elevated PSA 05/08/2014   H/O ulcer disease 05/08/2014   History of kidney stones 05/08/2014   Hx of pulmonary embolus 05/08/2014   Hypertension 05/08/2014   Kidney  stones 05/08/2014   Obesity 05/08/2014   Spinal stenosis 05/08/2014   Chronic back pain 06/21/2013   Diabetes mellitus ([place]) 06/20/2012   Coronary atherosclerosis 05/28/2010   ATRIAL FIBRILLATION 05/28/2010   CAD (coronary artery disease) 2010   PCP:  Baxter Hire, MD [facility]   [id number] [place] [id number]: [phone]: [phone]    Social Determinants of Health (SDOH) Interventions    Readmission Risk Interventions    03/23/2022   11:07 AM 08/24/2021    9:55 AM 07/09/2020    9:40 AM  Readmission Risk Prevention Plan  Transportation Screening Complete Complete Complete  PCP or Specialist Appt within 3-5 Days Complete Complete Complete  [place] or [place] Complete Complete  Social Work Consult for [place]/Counseling Complete Complete Complete  Palliative Care Screening Not Applicable Not Applicable Complete  Medication Review (Press photographer Complete Complete Complete

## 2022-08-03 NOTE — Progress Notes (Signed)
Patient having nausea and pain in back 8/10 (chronic).  Wants to get nausea medication and wait until that settles before getting pain medication.

## 2022-08-03 NOTE — Care Management Important Message (Signed)
Important Message  Patient Details  Name: ARHUM PEEPLES MRN: [id number] Date of Birth: 05-Aug-1941   Medicare Important Message Given:  Yes     Dannette Barbara 08/03/2022, 11:47 AM

## 2022-08-03 NOTE — Progress Notes (Addendum)
Patient was seen evaluated and examined by me and the PA on 08/03/22.  Course of action, evaluation, and management decisions were developed solely by me, but detailed below in the PA's note. [place] NOTE       Patient ID: Cody Price MRN: [id number] DOB/AGE: 11-06-1940 81 y.o.  Admit date: 07/31/2022 Referring Physician Dr. Francine Graven Primary Physician Dr. Edwina Barth  Primary Cardiologist Dr. Clayborn Bigness  Reason for Consultation AoCHF  HPI: Cody Price is an 10yoM with a PMH of HFpEF (50-55%, mod LVH, basal inf & inferolat hypo, mi-mod MR 03/2022), 2vCAD (CTO RCA with collaterals, 80% distal LAD 03/2022), persistent AF on eliquis, HTN, chronic thrombocytopenia, DM2 who presented to [facility] ED 07/31/22 with 1 week of shortness of breath. Cardiology is consulted with assistance with his HF.   Interval History:  -diuresing well, net neg 5.6L  -feels generally weak, but denies chest pain, shortness of breath, orthopnea or PND.  Peripheral edema has improved. -Remains in AF on telemetry, rate controlled in the 60s to 70s  Review of systems complete and found to be negative unless listed above     Past Medical History:  Diagnosis Date   Arthritis    degenerative back    BPH with obstruction/lower urinary tract symptoms    CAD (coronary artery disease) 2010   stent   Diabetes mellitus    ED (erectile dysfunction)    Elevated PSA    GERD (gastroesophageal reflux disease)    Gross hematuria    H/O CT scan    recent renal CT due to hematuria, cystoscopy - normal     Hematuria    History of hiatal hernia    History of kidney stones    HLD (hyperlipidemia)    Hypertension    Ischemic heart disease 1991   with angioplasty   Kidney stone    Morbid obesity ([place])    Myocardial infarction ([place])    Peripheral neuropathy    Pernicious anemia    Pneumonia 1995   /w PE,    Pulmonary embolism ([place])    previous   Renal cyst    Sleep apnea    pt. denies sleep  apnea, pt. states he has had 2 studies - last one 2011   Spinal stenosis     Past Surgical History:  Procedure Laterality Date   APPENDECTOMY     BACK SURGERY     x3 back surgeries - /w fusioin, 1- [place] 2- [facility]   bilateral inguinal hernia repair     cad     stent 2010   CARDIAC CATHETERIZATION  08/17/2014   CARPAL TUNNEL RELEASE  2014   bilateral    CHOLECYSTECTOMY     COLONOSCOPY WITH PROPOFOL N/A 07/11/2019   Procedure: COLONOSCOPY WITH PROPOFOL;  Surgeon: Toledo, Benay Pike, MD;  Location: [facility] ENDOSCOPY;  Service: Gastroenterology;  Laterality: N/A;   CORONARY BALLOON ANGIOPLASTY N/A 07/11/2020   Procedure: CORONARY BALLOON ANGIOPLASTY;  Surgeon: Isaias Cowman, MD;  Location: [place] CV LAB;  Service: Cardiovascular;  Laterality: N/A;   CORONARY STENT INTERVENTION N/A 06/07/2018   Procedure: CORONARY STENT INTERVENTION;  Surgeon: Nelva Bush, MD;  Location: [place] CV LAB;  Service: Cardiovascular;  Laterality: N/A;   CORONARY STENT INTERVENTION N/A 07/11/2020   Procedure: CORONARY STENT INTERVENTION;  Surgeon: Isaias Cowman, MD;  Location: [place] CV LAB;  Service: Cardiovascular;  Laterality: N/A;   ESOPHAGOGASTRODUODENOSCOPY (EGD) WITH PROPOFOL N/A 07/11/2019   Procedure: ESOPHAGOGASTRODUODENOSCOPY (EGD) WITH PROPOFOL;  Surgeon: Toledo, Benay Pike, MD;  Location: [facility] ENDOSCOPY;  Service: Gastroenterology;  Laterality: N/A;   ESOPHAGOGASTRODUODENOSCOPY (EGD) WITH PROPOFOL N/A 03/24/2022   Procedure: ESOPHAGOGASTRODUODENOSCOPY (EGD) WITH PROPOFOL;  Surgeon: Annamaria Helling, DO;  Location: [place];  Service: Gastroenterology;  Laterality: N/A;   LAMINECTOMY     LEFT HEART CATH AND CORONARY ANGIOGRAPHY N/A 06/07/2018   Procedure: LEFT HEART CATH AND CORONARY ANGIOGRAPHY;  Surgeon: Nelva Bush, MD;  Location: [place] CV LAB;  Service: Cardiovascular;  Laterality: N/A;   LEFT HEART CATH AND CORONARY ANGIOGRAPHY N/A 08/26/2021    Procedure: LEFT HEART CATH AND CORONARY ANGIOGRAPHY;  Surgeon: Wellington Hampshire, MD;  Location: [place] [facility];  Service: Cardiovascular;  Laterality: N/A;   RIGHT/LEFT HEART CATH AND CORONARY ANGIOGRAPHY N/A 07/11/2020   Procedure: RIGHT/LEFT HEART CATH AND CORONARY ANGIOGRAPHY;  Surgeon: Minna Merritts, MD;  Location: [place] CV LAB;  Service: Cardiovascular;  Laterality: N/A;   RIGHT/LEFT HEART CATH AND CORONARY ANGIOGRAPHY N/A 03/26/2022   Procedure: RIGHT/LEFT HEART CATH AND CORONARY ANGIOGRAPHY;  Surgeon: Wellington Hampshire, MD;  Location: [place] [facility] LAB;  Service: Cardiovascular;  Laterality: N/A;   TEE WITHOUT CARDIOVERSION N/A 03/30/2022   Procedure: TRANSESOPHAGEAL ECHOCARDIOGRAM (TEE);  Surgeon: Kate Sable, MD;  Location: [facility] ORS;  Service: Cardiovascular;  Laterality: N/A;    Medications Prior to Admission  Medication Sig Dispense Refill Last Dose   allopurinol (ZYLOPRIM) 100 MG tablet Take 100 mg by mouth daily.   07/30/2022   atorvastatin (LIPITOR) 40 MG tablet TAKE 1 TABLET BY MOUTH DAILY AT 6 PM. (Patient taking differently: Take 40 mg by mouth daily. TAKE 1 TABLET BY MOUTH DAILY AT 6 PM.) 90 tablet 0 07/30/2022 at am   Coenzyme Q10 (CO Q-10) 200 MG CAPS Take 200 mg by mouth daily.   07/30/2022   ELIQUIS 5 MG TABS tablet TAKE 1 TABLET BY MOUTH TWICE A DAY (Patient taking differently: Take 5 mg by mouth 2 (two) times daily.) 60 tablet 5 07/30/2022 at am   finasteride (PROSCAR) 5 MG tablet TAKE 1 TABLET BY MOUTH EVERY DAY (Patient taking differently: Take 5 mg by mouth daily.) 90 tablet 3 07/30/2022 at am   glimepiride (AMARYL) 4 MG tablet Take 1 tablet (4 mg total) by mouth 2 (two) times daily. Hold this medication until you see your PCP as you were started on farxiga as per cardio   07/30/2022   KLOR-CON M20 20 MEQ tablet Take 20 mEq by mouth daily.   07/30/2022   methocarbamol (ROBAXIN) 500 MG tablet Take 1 tablet (500 mg total) by mouth every 8 (eight)  hours as needed for muscle spasms. 15 tablet 0 07/30/2022   metoprolol succinate (TOPROL-XL) 50 MG 24 hr tablet TAKE 1 TABLET BY MOUTH EVERY DAY WITH OR IMMEDIATELY FOLLOWING A MEAL (Patient taking differently: Take 50 mg by mouth daily.) 90 tablet 0 07/30/2022   Multiple Vitamin (MULTIVITAMIN WITH MINERALS) TABS tablet Take 1 tablet by mouth daily. 30 tablet 0 07/30/2022   omega-3 acid ethyl esters (LOVAZA) 1 g capsule Take 2 g by mouth daily.   07/30/2022   omeprazole (PRILOSEC) 40 MG capsule Take 40 mg by mouth 2 (two) times daily.   07/30/2022   oxyCODONE (OXY IR/ROXICODONE) 5 MG immediate release tablet Take 5 mg by mouth 4 (four) times daily as needed.   07/30/2022   oxyCODONE-acetaminophen (PERCOCET) 10-325 MG tablet Take 1 tablet by mouth every 4 (four) hours as needed for pain.  07/30/2022   spironolactone (ALDACTONE) 25 MG tablet Take 0.5 tablets (12.5 mg total) by mouth daily. 15 tablet 0 07/30/2022   sucralfate (CARAFATE) 1 g tablet Take 1 g by mouth 4 (four) times daily.   07/30/2022   tamsulosin (FLOMAX) 0.4 MG CAPS capsule TAKE 1 CAPSULE BY MOUTH EVERY DAY 90 capsule 3 07/30/2022 at pm   torsemide (DEMADEX) 20 MG tablet Take 20 mg by mouth daily.   07/30/2022   acetaminophen (TYLENOL) 500 MG tablet Take 500 mg by mouth as needed.    prn   cyanocobalamin (,VITAMIN B-12,) 1000 MCG/ML injection Inject 1,000 mcg into the muscle every 30 (thirty) days.   07/15/2022   diazepam (VALIUM) 5 MG tablet Take 5 mg by mouth 3 (three) times daily as needed for anxiety or muscle spasms. (Patient not taking: Reported on 07/31/2022)   Not Taking   fluticasone (FLONASE) 50 MCG/ACT nasal spray Place 2 sprays into both nostrils daily. 9.9 mL 3 prn   metoprolol succinate (TOPROL-XL) 100 MG 24 hr tablet Take 0.5 tablets (50 mg total) by mouth daily. Take with or immediately following a meal. 15 tablet 0    sildenafil (VIAGRA) 100 MG tablet Take 100 mg by mouth daily as needed for erectile dysfunction.   prn    testosterone cypionate (DEPOTESTOSTERONE CYPIONATE) 200 MG/ML injection Inject 200 mg into the muscle every 28 (twenty-eight) days.   07/15/2022   Social History   Socioeconomic History   Marital status: Married    Spouse name: Not on file   Number of children: Not on file   Years of education: Not on file   Highest education level: Not on file  Occupational History   Not on file  Tobacco Use   Smoking status: Former    Packs/day: 3.00    Years: 15.00    Total pack years: 45.00    Types: Cigarettes    Quit date: 10/18/1985    Years since quitting: 36.8   Smokeless tobacco: Former   Tobacco comments:    tobacco use- no   Vaping Use   Vaping Use: Never used  Substance and Sexual Activity   Alcohol use: Not Currently    Comment: rare   Drug use: No   Sexual activity: Not on file  Other Topics Concern   Not on file  Social History Narrative   Walks regularly. Retired.    Social Determinants of Health   Financial Resource Strain: Not on file  Food Insecurity: No Food Insecurity (08/02/2022)   Hunger Vital Sign    Worried About Running Out of Food in the Last Year: Never true    Ran Out of Food in the Last Year: Never true  Transportation Needs: No Transportation Needs (08/02/2022)   PRAPARE - Hydrologist (Medical): No    Lack of Transportation (Non-Medical): No  Physical Activity: Not on file  Stress: Not on file  Social Connections: Not on file  Intimate Partner Violence: Not At Risk (08/02/2022)   Humiliation, Afraid, Rape, and Kick questionnaire    Fear of Current or Ex-Partner: No    Emotionally Abused: No    Physically Abused: No    Sexually Abused: No    Family History  Problem Relation Age of Onset   Heart failure Mother    Heart disease Father    Skin cancer Sister    Heart attack Brother    Kidney disease Neg Hx    Prostate cancer Neg Hx  Kidney cancer Neg Hx    Bladder Cancer Neg Hx      Vitals:   08/03/22 0100  08/03/22 0400 08/03/22 0500 08/03/22 0755  BP:  110/70  (!) 117/58  Pulse:  78  68  Resp:  20  18  Temp:  98.1 F (36.7 C)  (!) 97.5 F (36.4 C)  TempSrc:    Oral  SpO2: 96% 94%  96%  Weight:   101.7 kg   Height:        PHYSICAL EXAM General: Elderly [name], well nourished, in no acute distress.  No family at bedside HEENT:  Normocephalic and atraumatic. Neck:  No JVD.  Lungs: Normal respiratory effort on room air.  Trace crackles left base without appreciable wheezes.   Heart: Irregularly irregular with controlled rate. Normal S1 and S2 without gallops or murmurs.  Abdomen: Non-distended appearing with excess adiposity.  Msk: Normal strength and tone for age. Extremities: Warm and well perfused. No clubbing, cyanosis.  No peripheral edema.  Neuro: Alert and oriented X 3. Psych: Flat affect.  Answers questions appropriately.   Labs: Basic Metabolic Panel: Recent Labs    08/02/22 0502 08/03/22 0641  NA 140 138  K 3.6 3.5  CL 106 105  CO2 27 27  GLUCOSE 150* 191*  BUN 12 15  CREATININE 0.86 0.84  CALCIUM 8.8* 8.2*  MG 1.7 1.7   Liver Function Tests: No results for input(s): "AST", "ALT", "ALKPHOS", "BILITOT", "PROT", "ALBUMIN" in the last 72 hours. No results for input(s): "LIPASE", "AMYLASE" in the last 72 hours. CBC: Recent Labs    08/02/22 0502 08/03/22 0641  WBC 8.0 7.9  HGB 9.5* 9.3*  HCT 28.7* 28.1*  MCV 90.5 90.1  PLT 78* 75*   Cardiac Enzymes: No results for input(s): "CKTOTAL", "CKMB", "CKMBINDEX", "TROPONINIHS" in the last 72 hours. BNP: No results for input(s): "BNP" in the last 72 hours. D-Dimer: No results for input(s): "DDIMER" in the last 72 hours. Hemoglobin A1C: No results for input(s): "HGBA1C" in the last 72 hours. Fasting Lipid Panel: No results for input(s): "CHOL", "HDL", "LDLCALC", "TRIG", "CHOLHDL", "LDLDIRECT" in the last 72 hours. Thyroid Function Tests: No results for input(s): "TSH", "T4TOTAL", "T3FREE", "THYROIDAB"  in the last 72 hours.  Invalid input(s): "FREET3" Anemia Panel: No results for input(s): "VITAMINB12", "FOLATE", "FERRITIN", "TIBC", "IRON", "RETICCTPCT" in the last 72 hours.   Radiology: DG Chest 2 View  Result Date: 07/31/2022 CLINICAL DATA:  Shortness of breath and chest pressure. EXAM: CHEST - 2 VIEW COMPARISON:  03/22/2022 FINDINGS: Diffuse interstitial opacity suggests edema. Basilar atelectasis noted left greater than right. Cardiopericardial silhouette is at upper limits of normal for size. The visualized bony structures of the thorax are unremarkable. IMPRESSION: Diffuse interstitial opacity suggests edema. Electronically Signed   By: Misty Stanley M.D.   On: 07/31/2022 09:06    R/LHC 03/2022 Ost LM lesion is 20% stenosed.  Dist LAD lesion is 80% stenosed.  Prox LAD-1 lesion is 10% stenosed.  Mid LAD lesion is 10% stenosed.  Prox Cx lesion is 50% stenosed.  Dist RCA lesion is 100% stenosed.  Ost 1st Diag lesion is 90% stenosed.  Ost 3rd Mrg to 3rd Mrg lesion is 50% stenosed.  Prox RCA lesion is 100% stenosed.  Non-stenotic Prox LAD-2 lesion was previously treated.  There is moderate left ventricular systolic dysfunction.  LV end diastolic pressure is mildly elevated.  The left ventricular ejection fraction is 35-45% by visual estimate.   1. Significant two-vessel coronary artery  disease with patent LAD stents  with minimal restenosis. Chronically occluded right coronary artery with  left-to-right collaterals. Moderate left circumflex disease. No  significant change overall since most recent cardiac catheterization. The  coronary arteries are moderately calcified and diffusely diseased.  2. Moderately reduced LV systolic function with an EF of 35 to 40%.  3. Right heart catheterization showed moderately elevated filling  pressures, mild pulmonary hypertension and normal cardiac output.  Pulmonary wedge pressure was 25 mmHg with very prominent V waves  suggestive of  significant mitral regurgitation.   ECHO 03/2022  1. Left ventricular ejection fraction, by estimation, is 50 to 55%. The  left ventricle has low normal function. The left ventricle demonstrates  regional wall motion abnormalities (see scoring diagram/findings for  description). There is moderate left  ventricular hypertrophy. Left ventricular diastolic function could not be  evaluated. There is mild hypokinesis of the left ventricular, basal  inferior and inferolateral segments.   2. Right ventricular systolic function is normal. The right ventricular  size is mildly enlarged. There is moderately elevated pulmonary artery  systolic pressure. The estimated right ventricular systolic pressure is  27.7 mmHg.   3. Left atrial size was mildly dilated.   4. Right atrial size was mildly dilated.   5. The mitral valve is abnormal. Mild to moderate mitral valve  regurgitation.   6. Tricuspid valve regurgitation is mild to moderate.   7. The aortic valve is tricuspid. There is mild thickening of the aortic  valve. Aortic valve regurgitation is not visualized. Aortic valve  sclerosis is present, with no evidence of aortic valve stenosis.   8. Aortic dilatation noted. There is borderline dilatation of the aortic  root and of the ascending aorta, measuring 38 mm.   9. The inferior vena cava is dilated in size with <50% respiratory  variability, suggesting right atrial pressure of 15 mmHg.   TELEMETRY reviewed by me (LT) 08/03/2022 : AF 60s-70s with occasional PVCs  EKG reviewed by me: AF RVR rate 106 with nonspecific ST changes  Data reviewed by me (LT) 08/03/2022: Last outpatient cardiology note, hospitalist progress note CBC BMP chest x-ray vitals telemetry I's and O's  Principal Problem:   Acute on chronic heart failure with preserved ejection fraction (HFpEF) ([place]) Active Problems:   ATRIAL FIBRILLATION   Thrombocytopenia (HCC)   BPH (benign prostatic hyperplasia)   Hypertension    Spinal stenosis   Coronary artery disease involving native coronary artery of native heart without angina pectoris   Diabetes mellitus without complication ([place])   Anxiety   Elevated troponin I level    ASSESSMENT AND PLAN:  Cody Price is an 73yoM with a PMH of HFpEF (50-55%, mod LVH, basal inf & inferolat hypo, mi-mod MR 03/2022), 2vCAD (CTO RCA with collaterals, 80% distal LAD 03/2022), persistent AF on eliquis, HTN, chronic thrombocytopenia, DM2 who presented to [facility] ED 07/31/22 with 1 week of shortness of breath. Cardiology is consulted with assistance with his HF.   # acute on chronic HFpEF  Presented with progressively worsening shortness of breath and orthopnea x 1 week, BNP elevated to 729 on admission.  He is on room air and appears euvolemic on exam, only trace left-sided crackles today.  He has diuresed a total of 5.6 L since admission. -S/p IV Lasix 60 mg x 1 and 40 mg x 5.  Will dose another 40 mg of IV Lasix this afternoon, with anticipation of readiness for discharge following this, if not tomorrow morning at  the latest. -Can discharge home on 60 mg of torsemide once a day (previously taking 40 once a day) -Continue other GDMT with metoprolol XL 50 mg once a day, spironolactone 12.5 once a day -Follow-up with Dr. Clayborn Bigness in 1 to 2 weeks.  # persistent AF RVR  Rate controlled on telemetry. -Continue metoprolol XL 50 mg once daily -continue eliquis '5mg'$  BID. CHADS2-VASC 5 (age, HTN, CAD, DM2)    # CAD without angina # demand ischemia  Minimal elevation with a flat trent 49-46, most consistent with demand/supply mismatch and not ACS  -continue atorva 40 daily  -on eliquis monotherapy d/t chronic thrombocytopenia (plts 75K)   This patient's plan of care was discussed and created with Dr. Clayborn Bigness and he is in agreement.  Signed: Tristan Schroeder , PA-C 08/03/2022, 12:02 PM [facility] Cardiology

## 2022-08-03 NOTE — Progress Notes (Signed)
       CROSS COVER NOTE  NAME: Cody Price MRN: [id number] DOB : [birth year] ATTENDING PHYSICIAN: Enzo Bi, MD    Date of Service   08/03/2022   HPI/Events of Note   Medication request received for sleep aid.  Interventions   Assessment/Plan:  Melatonin     This document was prepared using Dragon voice recognition software and may include unintentional dictation errors.  Neomia Glass DNP, MBA, FNP-BC Nurse Practitioner [facility] Pager [phone]

## 2022-08-03 NOTE — Progress Notes (Signed)
  PROGRESS NOTE    SHOWN DISSINGER  SMO:[id number] DOB: [birth year] DOA: 07/31/2022 PCP: Baxter Hire, MD  [id number]  LOS: 4 days   Brief hospital course:   Assessment & Plan: Cody Price is a 81 y.o. male with medical history significant for GERD, hypertension, chronic diastolic dysfunction CHF, history of spinal stenosis, coronary artery disease status post stent angioplasty, diabetes mellitus who presents to the ER for evaluation of worsening shortness of breath over the last 1 week. He denies having any dietary indiscretion and has been compliant with his medications.  Denies any significant weight gain.   * Acute on chronic heart failure with preserved ejection fraction (HFpEF) ([place]) Last 2D echo showed an echocardiogram of 55 to 60% from 07/23.  Take torsemide at home. --cardiology consulted Plan: --cont IV lasix 40 BID Continue metoprolol and spironolactone  Diabetes mellitus without complication (HCC) On oral hypoglycemic agents Hold Amaryl --ACHS and SSI  Thrombocytopenia (HCC) Chronic No evidence of bleeding Monitor closely during this hospitalization   BPH (benign prostatic hyperplasia) Continue Flomax and finasteride  Hypertension Continue metoprolol and aldactone  Anxiety Continue diazepam as needed   Elevated troponin I level Most likely secondary to demand ischemia from acute on chronic CHF with preserved EF. --trop 40's flat  Coronary artery disease involving native coronary artery of native heart without angina pectoris Status post PCI --cardiology consulted  Spinal stenosis With chronic low back pain Continue oxycodone  ATRIAL FIBRILLATION Continue metoprolol for rate control Continue apixaban as primary prophylaxis for an acute stroke   DVT prophylaxis: BE:EFEOFHQ Code Status: DNR  Family Communication:  Level of care: Progressive Dispo:   The patient is from: home Anticipated d/c is to: home Anticipated d/c date is:  tomorrow   Subjective and Interval History:  Pt complained of nausea.     Objective: Vitals:   08/04/22 0420 08/04/22 0800 08/04/22 1149 08/04/22 1650  BP: (!) 130/58 114/63 (!) 113/54 119/60  Pulse: 78 73 76 77  Resp: '20 20 16 16  '$ Temp: 98.1 F (36.7 C) 97.6 F (36.4 C) (!) 97.5 F (36.4 C) (!) 97.5 F (36.4 C)  TempSrc: Oral Oral Oral   SpO2: 100% 97% 96% 98%  Weight: 101.3 kg     Height:        Intake/Output Summary (Last 24 hours) at 08/04/2022 2149 Last data filed at 08/04/2022 0947 Gross per 24 hour  Intake --  Output 1450 ml  Net -1450 ml   Filed Weights   07/31/22 1111 08/03/22 0500 08/04/22 0420  Weight: 107.7 kg 101.7 kg 101.3 kg    Examination:   Constitutional: NAD, AAOx3 HEENT: conjunctivae and lids normal, EOMI CV: No cyanosis.   RESP: normal respiratory effort, on RA Neuro: II - XII grossly intact.   Psych: Normal mood and affect.  Appropriate judgement and reason   Data Reviewed: I have personally reviewed labs and imaging studies  Time spent: 25 minutes  Enzo Bi, MD [facility] If 7PM-7AM, please contact night-coverage 08/04/2022, 9:49 PM

## 2022-08-04 DIAGNOSIS — I480 Paroxysmal atrial fibrillation: Secondary | ICD-10-CM

## 2022-08-04 DIAGNOSIS — I5033 Acute on chronic diastolic (congestive) heart failure: Secondary | ICD-10-CM | POA: Diagnosis not present

## 2022-08-04 DIAGNOSIS — I10 Essential (primary) hypertension: Secondary | ICD-10-CM

## 2022-08-04 LAB — CBC
HCT: 30.9 % — ABNORMAL LOW (ref 39.0–52.0)
Hemoglobin: 10 g/dL — ABNORMAL LOW (ref 13.0–17.0)
MCH: 29.2 pg (ref 26.0–34.0)
MCHC: 32.4 g/dL (ref 30.0–36.0)
MCV: 90.4 fL (ref 80.0–100.0)
Platelets: 78 10*3/uL — ABNORMAL LOW (ref 150–400)
RBC: 3.42 MIL/uL — ABNORMAL LOW (ref 4.22–5.81)
RDW: 18.4 % — ABNORMAL HIGH (ref 11.5–15.5)
WBC: 8.2 10*3/uL (ref 4.0–10.5)
nRBC: 0 % (ref 0.0–0.2)

## 2022-08-04 LAB — BASIC METABOLIC PANEL
Anion gap: 6 (ref 5–15)
BUN: 15 mg/dL (ref 8–23)
CO2: 26 mmol/L (ref 22–32)
Calcium: 9.2 mg/dL (ref 8.9–10.3)
Chloride: 104 mmol/L (ref 98–111)
Creatinine, Ser: 0.84 mg/dL (ref 0.61–1.24)
GFR, Estimated: >60 mL/min (ref >60)
Glucose, Bld: 147 mg/dL — ABNORMAL HIGH (ref 70–99)
Potassium: 4.3 mmol/L (ref 3.5–5.1)
Sodium: 136 mmol/L (ref 135–145)

## 2022-08-04 LAB — GLUCOSE, CAPILLARY
Glucose-Capillary: 152 mg/dL — ABNORMAL HIGH (ref 70–99)
Glucose-Capillary: 239 mg/dL — ABNORMAL HIGH (ref 70–99)
Glucose-Capillary: 226 mg/dL — ABNORMAL HIGH (ref 70–99)

## 2022-08-04 LAB — MAGNESIUM: Magnesium: 1.9 mg/dL (ref 1.7–2.4)

## 2022-08-04 MED ORDER — TORSEMIDE 60 MG PO TABS: 60.0000 mg | ORAL_TABLET | Freq: Every day | ORAL | 0 refills | Status: AC

## 2022-08-04 MED ORDER — POTASSIUM CHLORIDE CRYS ER 20 MEQ PO TBCR: 20.0000 meq | EXTENDED_RELEASE_TABLET | Freq: Every day | ORAL | Status: DC

## 2022-08-04 NOTE — Progress Notes (Addendum)
[facility] Cardiology    SUBJECTIVE: Patient states he feels somewhat better less short of breath less fatigue still has some weakness has not ambulated much today but ambulated around the nurses station twice yesterday and did reasonably well feels well enough to go home today   Vitals:   08/03/22 2014 08/04/22 0026 08/04/22 0420 08/04/22 0800  BP: 105/67 122/62 (!) 130/58 114/63  Pulse: 66 71 78 73  Resp: '16 16 20 20  '$ Temp: 97.7 F (36.5 C) (!) 97.5 F (36.4 C) 98.1 F (36.7 C) 97.6 F (36.4 C)  TempSrc: Oral  Oral Oral  SpO2: 96% 100% 100% 97%  Weight:   101.3 kg   Height:         Intake/Output Summary (Last 24 hours) at 08/04/2022 0815 Last data filed at 08/04/2022 0031 Gross per 24 hour  Intake --  Output 1800 ml  Net -1800 ml      PHYSICAL EXAM  General: Well developed, well nourished, in no acute distress HEENT:  Normocephalic and atramatic Neck:  No JVD.  Lungs: Clear bilaterally to auscultation and percussion. Heart: HRRR . Normal S1 and S2 without gallops or murmurs.  Abdomen: Bowel sounds are positive, abdomen soft and non-tender  Msk:  Back normal, normal gait. Normal strength and tone for age. Extremities: No clubbing, cyanosis or 2+edema.   Neuro: Alert and oriented X 3. Psych:  Good affect, responds appropriately   LABS: Basic Metabolic Panel: Recent Labs    08/03/22 0641 08/04/22 0453  NA 138 136  K 3.5 4.3  CL 105 104  CO2 27 26  GLUCOSE 191* 147*  BUN 15 15  CREATININE 0.84 0.84  CALCIUM 8.2* 9.2  MG 1.7 1.9   Liver Function Tests: No results for input(s): "AST", "ALT", "ALKPHOS", "BILITOT", "PROT", "ALBUMIN" in the last 72 hours. No results for input(s): "LIPASE", "AMYLASE" in the last 72 hours. CBC: Recent Labs    08/03/22 0641 08/04/22 0453  WBC 7.9 8.2  HGB 9.3* 10.0*  HCT 28.1* 30.9*  MCV 90.1 90.4  PLT 75* 78*   Cardiac Enzymes: No results for input(s): "CKTOTAL", "CKMB", "CKMBINDEX", "TROPONINI" in the last 72  hours. BNP: Invalid input(s): "POCBNP" D-Dimer: No results for input(s): "DDIMER" in the last 72 hours. Hemoglobin A1C: No results for input(s): "HGBA1C" in the last 72 hours. Fasting Lipid Panel: No results for input(s): "CHOL", "HDL", "LDLCALC", "TRIG", "CHOLHDL", "LDLDIRECT" in the last 72 hours. Thyroid Function Tests: No results for input(s): "TSH", "T4TOTAL", "T3FREE", "THYROIDAB" in the last 72 hours.  Invalid input(s): "FREET3" Anemia Panel: No results for input(s): "VITAMINB12", "FOLATE", "FERRITIN", "TIBC", "IRON", "RETICCTPCT" in the last 72 hours.  No results found.   Echo preserved left ventricular function  TELEMETRY: Normal sinus rhythm at about 60:  ASSESSMENT AND PLAN:  Principal Problem:   Acute on chronic heart failure with preserved ejection fraction (HFpEF) (HCC) Active Problems:   ATRIAL FIBRILLATION   Thrombocytopenia (HCC)   BPH (benign prostatic hyperplasia)   Hypertension   Spinal stenosis   Coronary artery disease involving native coronary artery of native heart without angina pectoris   Diabetes mellitus without complication (HCC)   Anxiety   Elevated troponin I level    Plan Acute on chronic diastolic congestive heart failure continue diuresis with torsemide will increase dose to 60 mg p.o. daily Atrial fibrillation continue anticoagulation with Eliquis rate control Agree with diabetes management and control continue diet and exercise Hyperlipidemia continue Lipitor 40 mg a day for lipid management Blood  pressure rate control with metoprolol therapy Increase activity have the patient ambulate in the halls Consider early discharge if patient ambulates adequately and shortness of breath is improved Have the patient follow-up with cardiology 2 to 4 weeks   Yolonda Kida, MD 08/04/2022 8:15 AM  This is an done improvement previously.  Note there was an area in my note I wrote Lasix but I meant torsemide.  I need him to go home on 60  mg a day of torsemide.  Apologize for the error.

## 2022-08-04 NOTE — Plan of Care (Signed)

## 2022-08-04 NOTE — Discharge Summary (Signed)
Physician Discharge Summary  Cody Price CWC:[id number] DOB: 1940-12-10 DOA: 07/31/2022  PCP: Baxter Hire, MD  Admit date: 07/31/2022 Discharge date: 08/04/2022  Admitted From: home  Disposition:  home   Recommendations for Outpatient Follow-up:  Follow up with PCP in 1-2 weeks F/u w/ cardio, Cody cardio, in 2-4 weeks   Home Health: no  Equipment/Devices:  Discharge Condition: stable  CODE STATUS: full  Diet recommendation: Heart Healthy  Brief/Interim Summary: HPI was taken from Dr. Francine Graven: Cody Price is a 81 y.o. male with medical history significant for GERD, hypertension, chronic diastolic dysfunction CHF, history of spinal stenosis, coronary artery disease status post stent angioplasty, diabetes mellitus who presents to the ER for evaluation of worsening shortness of breath over the last 1 week. He denies having any dietary indiscretion and has been compliant with his medications.  Denies any significant weight gain. Patient states that 1 day prior to his admission that he had worsening shortness of breath, two-pillow orthopnea and bilateral lower extremity swelling.  He also complains of some midsternal chest pain that is nonradiating and denies having any nausea, no vomiting, no palpitations or diaphoresis. He denies having any abdominal pain, no changes in his bowel habits, no cough, no fever, no chills, no urinary symptoms, no headache, no dizziness, no lightheadedness, no blurred vision, no focal deficit. Labs show elevated BNP levels Chest x-ray reviewed by me shows diffuse interstitial opacity suggests edema.  Twelve-lead EKG reviewed by me showed A-fib with PVC's and low voltage QRS  As per Dr. Billie Ruddy: Cody Price is a 81 y.o. male with medical history significant for GERD, hypertension, chronic diastolic dysfunction CHF, history of spinal stenosis, coronary artery disease status post stent angioplasty, diabetes mellitus who presents to the ER for evaluation of  worsening shortness of breath over the last 1 week. He denies having any dietary indiscretion and has been compliant with his medications.  Denies any significant weight gain.  As per Dr. Jimmye Norman 08/04/22: Pt's shortness of breath had resolved by the time I started to see the pt. Pt will be d/c home on torsemide '60mg'$  daily as per cardio. Pt will f/u w/ cardio in 2-4 weeks. Pt was ambulating independently so therapy was not indicated.    Discharge Diagnoses:  Principal Problem:   Acute on chronic heart failure with preserved ejection fraction (HFpEF) (HCC) Active Problems:   Diabetes mellitus without complication (HCC)   Thrombocytopenia (HCC)   BPH (benign prostatic hyperplasia)   Hypertension   Anxiety   ATRIAL FIBRILLATION   Spinal stenosis   Coronary artery disease involving native coronary artery of native heart without angina pectoris   Elevated troponin I level  Acute on chronic diastolic heart failure: echo showed an echocardiogram of 55 to 60% from 07/23.  Continue on lasix, metoprolol, aldactone as per cardio    DM2: well controlled, HbA1c 6.5. Continue on SSI w/ accuchecks    Thrombocytopenia: chronic and labile    BPH: continue on flomax, finasteride    HTN: continue on metoprolol, aldactone    Anxiety: severity unknown. Diazepam prn    Elevated troponins: likely secondary to demand ischemia    Hx of CAD: continue on metoprolol, aldactone, statin   Spinal stenosis: chronic. Continue on oxycodone    Likely PAF: continue on metoprolol, eliquis   Discharge Instructions  Discharge Instructions     Diet - low sodium heart healthy   Complete by: As directed    Discharge instructions   Complete by:  As directed    F/u w/ cardio, Cody cardio, in 2-4 weeks. F/u w/ PCP in 1-2 weeks   Increase activity slowly   Complete by: As directed       Allergies as of 08/04/2022       Reactions   Novocain [procaine] Other (See Comments)   Syncope   Procaine Hcl Other  (See Comments)   Passes out   Lyrica [pregabalin]    Neurontin [gabapentin]    Amitriptyline Other (See Comments)   Sleepy   Dicyclomine Other (See Comments)   Other reaction(s): Abdominal Pain   Metformin Nausea Only   Nitroglycerin Other (See Comments)   Makes Blood pressure go to low.         Medication List     STOP taking these medications    diazepam 5 MG tablet Commonly known as: VALIUM   oxyCODONE-acetaminophen 10-325 MG tablet Commonly known as: PERCOCET       TAKE these medications    acetaminophen 500 MG tablet Commonly known as: TYLENOL Take 500 mg by mouth as needed.   allopurinol 100 MG tablet Commonly known as: ZYLOPRIM Take 100 mg by mouth daily.   atorvastatin 40 MG tablet Commonly known as: LIPITOR TAKE 1 TABLET BY MOUTH DAILY AT 6 PM. What changed:  when to take this additional instructions   Co Q-10 200 MG Caps Take 200 mg by mouth daily.   cyanocobalamin 1000 MCG/ML injection Commonly known as: VITAMIN B12 Inject 1,000 mcg into the muscle every 30 (thirty) days.   Eliquis 5 MG Tabs tablet Generic drug: apixaban TAKE 1 TABLET BY MOUTH TWICE A DAY What changed: how much to take   finasteride 5 MG tablet Commonly known as: PROSCAR TAKE 1 TABLET BY MOUTH EVERY DAY   fluticasone 50 MCG/ACT nasal spray Commonly known as: FLONASE Place 2 sprays into both nostrils daily.   glimepiride 4 MG tablet Commonly known as: AMARYL Take 1 tablet (4 mg total) by mouth 2 (two) times daily. Hold this medication until you see your PCP as you were started on farxiga as per cardio   Klor-Con M20 20 MEQ tablet Generic drug: potassium chloride SA Take 20 mEq by mouth daily.   methocarbamol 500 MG tablet Commonly known as: Robaxin Take 1 tablet (500 mg total) by mouth every 8 (eight) hours as needed for muscle spasms.   metoprolol succinate 50 MG 24 hr tablet Commonly known as: TOPROL-XL TAKE 1 TABLET BY MOUTH EVERY DAY WITH OR IMMEDIATELY  FOLLOWING A MEAL What changed:  See the new instructions. Another medication with the same name was removed. Continue taking this medication, and follow the directions you see here.   multivitamin with minerals Tabs tablet Take 1 tablet by mouth daily.   omega-3 acid ethyl esters 1 g capsule Commonly known as: LOVAZA Take 2 g by mouth daily.   omeprazole 40 MG capsule Commonly known as: PRILOSEC Take 40 mg by mouth 2 (two) times daily.   oxyCODONE 5 MG immediate release tablet Commonly known as: Oxy IR/ROXICODONE Take 5 mg by mouth 4 (four) times daily as needed.   sildenafil 100 MG tablet Commonly known as: VIAGRA Take 100 mg by mouth daily as needed for erectile dysfunction.   spironolactone 25 MG tablet Commonly known as: ALDACTONE Take 0.5 tablets (12.5 mg total) by mouth daily.   sucralfate 1 g tablet Commonly known as: CARAFATE Take 1 g by mouth 4 (four) times daily.   tamsulosin 0.4 MG Caps capsule  Commonly known as: FLOMAX TAKE 1 CAPSULE BY MOUTH EVERY DAY   testosterone cypionate 200 MG/ML injection Commonly known as: DEPOTESTOSTERONE CYPIONATE Inject 200 mg into the muscle every 28 (twenty-eight) days.   Torsemide 60 MG Tabs Take 60 mg by mouth daily. Start taking on: August 05, 2022 What changed:  medication strength how much to take        Follow-up Information     Callwood, Karma Greaser D, MD. Go in 1 week(s).   Specialties: Cardiology, Internal Medicine Contact information: [place] [address] [phone]                Allergies  Allergen Reactions   Novocain [Procaine] Other (See Comments)    Syncope   Procaine Hcl Other (See Comments)    Passes out   Lyrica [Pregabalin]    Neurontin [Gabapentin]    Amitriptyline Other (See Comments)    Sleepy   Dicyclomine Other (See Comments)    Other reaction(s): Abdominal Pain   Metformin Nausea Only   Nitroglycerin Other (See Comments)    Makes Blood pressure go  to low.     Consultations: Cardio: Dr. Nehemiah Massed & Dr. Clayborn Bigness   Procedures/Studies: DG Chest 2 View  Result Date: 07/31/2022 CLINICAL DATA:  Shortness of breath and chest pressure. EXAM: CHEST - 2 VIEW COMPARISON:  03/22/2022 FINDINGS: Diffuse interstitial opacity suggests edema. Basilar atelectasis noted left greater than right. Cardiopericardial silhouette is at upper limits of normal for size. The visualized bony structures of the thorax are unremarkable. IMPRESSION: Diffuse interstitial opacity suggests edema. Electronically Signed   By: Misty Stanley M.D.   On: 07/31/2022 09:06   (Echo, Carotid, EGD, Colonoscopy, ERCP)    Subjective: Pt denies any complaints    Discharge Exam: Vitals:   08/04/22 0800 08/04/22 1149  BP: 114/63 (!) 113/54  Pulse: 73 76  Resp: 20 16  Temp: 97.6 F (36.4 C) (!) 97.5 F (36.4 C)  SpO2: 97% 96%   Vitals:   08/04/22 0026 08/04/22 0420 08/04/22 0800 08/04/22 1149  BP: 122/62 (!) 130/58 114/63 (!) 113/54  Pulse: 71 78 73 76  Resp: '16 20 20 16  '$ Temp: (!) 97.5 F (36.4 C) 98.1 F (36.7 C) 97.6 F (36.4 C) (!) 97.5 F (36.4 C)  TempSrc:  Oral Oral Oral  SpO2: 100% 100% 97% 96%  Weight:  101.3 kg    Height:        General: Pt is alert, awake, not in acute distress Cardiovascular: S1/S2 +, no rubs, no gallops Respiratory: decreased breath sounds b/l otherwise clear  Abdominal: Soft, NT, ND, bowel sounds + Extremities: no cyanosis    The results of significant diagnostics from this hospitalization (including imaging, microbiology, ancillary and laboratory) are listed below for reference.     Microbiology: No results found for this or any previous visit (from the past 240 hour(s)).   Labs: BNP (last 3 results) Recent Labs    08/22/21 0500 03/22/22 1015 07/31/22 0830  BNP 574.8* 431.0* 889.1*   Basic Metabolic Panel: Recent Labs  Lab 07/31/22 0830 08/01/22 0527 08/02/22 0502 08/03/22 0641 08/04/22 0453  NA 141 141 140  138 136  K 3.8 3.6 3.6 3.5 4.3  CL 107 108 106 105 104  CO2 '26 27 27 27 26  '$ GLUCOSE 234* 147* 150* 191* 147*  BUN '12 10 12 15 15  '$ CREATININE 0.78 0.71 0.86 0.84 0.84  CALCIUM 9.0 8.6* 8.8* 8.2* 9.2  MG  --   --  1.7 1.7 1.9   Liver Function Tests: No results for input(s): "AST", "ALT", "ALKPHOS", "BILITOT", "PROT", "ALBUMIN" in the last 168 hours. No results for input(s): "LIPASE", "AMYLASE" in the last 168 hours. No results for input(s): "AMMONIA" in the last 168 hours. CBC: Recent Labs  Lab 07/31/22 0830 08/01/22 0527 08/02/22 0502 08/03/22 0641 08/04/22 0453  WBC 7.6 7.0 8.0 7.9 8.2  HGB 9.9* 9.3* 9.5* 9.3* 10.0*  HCT 30.1* 28.7* 28.7* 28.1* 30.9*  MCV 90.9 90.3 90.5 90.1 90.4  PLT 85* 79* 78* 75* 78*   Cardiac Enzymes: No results for input(s): "CKTOTAL", "CKMB", "CKMBINDEX", "TROPONINI" in the last 168 hours. BNP: Invalid input(s): "POCBNP" CBG: Recent Labs  Lab 08/03/22 1710 08/03/22 2019 08/04/22 0800 08/04/22 1156 08/04/22 1624  GLUCAP 224* 151* 152* 239* 226*   D-Dimer No results for input(s): "DDIMER" in the last 72 hours. Hgb A1c No results for input(s): "HGBA1C" in the last 72 hours. Lipid Profile No results for input(s): "CHOL", "HDL", "LDLCALC", "TRIG", "CHOLHDL", "LDLDIRECT" in the last 72 hours. Thyroid function studies No results for input(s): "TSH", "T4TOTAL", "T3FREE", "THYROIDAB" in the last 72 hours.  Invalid input(s): "FREET3" Anemia work up No results for input(s): "VITAMINB12", "FOLATE", "FERRITIN", "TIBC", "IRON", "RETICCTPCT" in the last 72 hours. Urinalysis    Component Value Date/Time   COLORURINE YELLOW (A) 03/24/2022 1045   APPEARANCEUR CLEAR (A) 03/24/2022 1045   APPEARANCEUR Clear 01/01/2016 1354   LABSPEC 1.008 03/24/2022 1045   LABSPEC 1.023 04/25/2014 0238   PHURINE 6.0 03/24/2022 1045   GLUCOSEU NEGATIVE 03/24/2022 1045   GLUCOSEU 50 mg/dL 04/25/2014 0238   HGBUR NEGATIVE 03/24/2022 [place]  03/24/2022 1045   BILIRUBINUR Negative 01/01/2016 1354   BILIRUBINUR Negative 04/25/2014 [place] 03/24/2022 [place] 03/24/2022 1045   NITRITE NEGATIVE 03/24/2022 1045   LEUKOCYTESUR NEGATIVE 03/24/2022 1045   LEUKOCYTESUR Negative 04/25/2014 0238   Sepsis Labs Recent Labs  Lab 08/01/22 0527 08/02/22 0502 08/03/22 0641 08/04/22 0453  WBC 7.0 8.0 7.9 8.2   Microbiology No results found for this or any previous visit (from the past 240 hour(s)).   Time coordinating discharge: Over 30 minutes  SIGNED:   Wyvonnia Dusky, MD  [facility] 08/04/2022, 4:35 PM Pager   If 7PM-7AM, please contact night-coverage [email or web address]

## 2022-08-04 NOTE — Progress Notes (Signed)
Mobility Specialist - Progress Note   08/04/22 0947  Mobility  Activity Ambulated with assistance in room;Stood at bedside;Dangled on edge of bed  Level of Assistance Standby assist, set-up cues, supervision of patient - no hands on  Assistive Device Front wheel walker  Distance Ambulated (ft) 60 ft  Activity Response Tolerated well  $Mobility charge 1 Mobility   Pt supine in bed on RA upon arrival. Pt STS and ambulates in room Supervision. Pt returns to bed with needs in reach.   Cody Price  Mobility Specialist  08/04/22 9:48 AM

## 2022-08-04 NOTE — Progress Notes (Signed)
PROGRESS NOTE    Cody Price  AJO:[id number] DOB: 1941-01-13 DOA: 07/31/2022 PCP: Baxter Hire, MD   Assessment & Plan:   Principal Problem:   Acute on chronic heart failure with preserved ejection fraction (HFpEF) ([place]) Active Problems:   Diabetes mellitus without complication (HCC)   Thrombocytopenia (HCC)   BPH (benign prostatic hyperplasia)   Hypertension   Anxiety   ATRIAL FIBRILLATION   Spinal stenosis   Coronary artery disease involving native coronary artery of native heart without angina pectoris   Elevated troponin I level  Assessment and Plan: Acute on chronic diastolic heart failure: echo showed an echocardiogram of 55 to 60% from 07/23.  Continue on lasix, metoprolol, aldactone as per cardio    DM2: well controlled, HbA1c 6.5. Continue on SSI w/ accuchecks    Thrombocytopenia: chronic and labile    BPH: continue on flomax, finasteride    HTN: continue on metoprolol, aldactone    Anxiety: severity unknown. Diazepam prn    Elevated troponins: likely secondary to demand ischemia    Hx of CAD: continue on metoprolol, aldactone, statin   Spinal stenosis: chronic. Continue on oxycodone    Likely PAF: continue on metoprolol, eliquis       DVT prophylaxis: eliquis  Code Status: full  Family Communication: Disposition Plan: likely d/c back home   Status is: Inpatient Remains inpatient appropriate because: likely d/c back home tomrorrow     Level of care: Progressive Consultants:  Cardio   Procedures:   Antimicrobials:    Subjective: Pt c/o malaise  Objective: Vitals:   08/03/22 2014 08/04/22 0026 08/04/22 0420 08/04/22 0800  BP: 105/67 122/62 (!) 130/58 114/63  Pulse: 66 71 78 73  Resp: '16 16 20 20  '$ Temp: 97.7 F (36.5 C) (!) 97.5 F (36.4 C) 98.1 F (36.7 C) 97.6 F (36.4 C)  TempSrc: Oral  Oral Oral  SpO2: 96% 100% 100% 97%  Weight:   101.3 kg   Height:        Intake/Output Summary (Last 24 hours) at 08/04/2022  0903 Last data filed at 08/04/2022 0031 Gross per 24 hour  Intake --  Output 1800 ml  Net -1800 ml   Filed Weights   07/31/22 1111 08/03/22 0500 08/04/22 0420  Weight: 107.7 kg 101.7 kg 101.3 kg    Examination:  General exam: Appears calm and comfortable  Respiratory system: diminished breath sounds b/l  Cardiovascular system: S1 & S2 +. No rubs, gallops or clicks.  Gastrointestinal system: Abdomen is nondistended, soft and nontender. Normal bowel sounds heard. Central nervous system: Alert and oriented. Moves all extremities  Psychiatry: Judgement and insight appear normal. Mood & affect appropriate.     Data Reviewed: I have personally reviewed following labs and imaging studies  CBC: Recent Labs  Lab 07/31/22 0830 08/01/22 0527 08/02/22 0502 08/03/22 0641 08/04/22 0453  WBC 7.6 7.0 8.0 7.9 8.2  HGB 9.9* 9.3* 9.5* 9.3* 10.0*  HCT 30.1* 28.7* 28.7* 28.1* 30.9*  MCV 90.9 90.3 90.5 90.1 90.4  PLT 85* 79* 78* 75* 78*   Basic Metabolic Panel: Recent Labs  Lab 07/31/22 0830 08/01/22 0527 08/02/22 0502 08/03/22 0641 08/04/22 0453  NA 141 141 140 138 136  K 3.8 3.6 3.6 3.5 4.3  CL 107 108 106 105 104  CO2 '26 27 27 27 26  '$ GLUCOSE 234* 147* 150* 191* 147*  BUN '12 10 12 15 15  '$ CREATININE 0.78 0.71 0.86 0.84 0.84  CALCIUM 9.0 8.6* 8.8* 8.2* 9.2  MG  --   --  1.7 1.7 1.9   GFR: Estimated Creatinine Clearance: 87.8 mL/min (by C-G formula based on SCr of 0.84 mg/dL). Liver Function Tests: No results for input(s): "AST", "ALT", "ALKPHOS", "BILITOT", "PROT", "ALBUMIN" in the last 168 hours. No results for input(s): "LIPASE", "AMYLASE" in the last 168 hours. No results for input(s): "AMMONIA" in the last 168 hours. Coagulation Profile: No results for input(s): "INR", "PROTIME" in the last 168 hours. Cardiac Enzymes: No results for input(s): "CKTOTAL", "CKMB", "CKMBINDEX", "TROPONINI" in the last 168 hours. BNP (last 3 results) No results for input(s): "PROBNP" in  the last 8760 hours. HbA1C: No results for input(s): "HGBA1C" in the last 72 hours. CBG: Recent Labs  Lab 08/03/22 0756 08/03/22 1204 08/03/22 1710 08/03/22 2019 08/04/22 0800  GLUCAP 216* 218* 224* 151* 152*   Lipid Profile: No results for input(s): "CHOL", "HDL", "LDLCALC", "TRIG", "CHOLHDL", "LDLDIRECT" in the last 72 hours. Thyroid Function Tests: No results for input(s): "TSH", "T4TOTAL", "FREET4", "T3FREE", "THYROIDAB" in the last 72 hours. Anemia Panel: No results for input(s): "VITAMINB12", "FOLATE", "FERRITIN", "TIBC", "IRON", "RETICCTPCT" in the last 72 hours. Sepsis Labs: No results for input(s): "PROCALCITON", "LATICACIDVEN" in the last 168 hours.  No results found for this or any previous visit (from the past 240 hour(s)).       Radiology Studies: No results found.      Scheduled Meds:  allopurinol  100 mg Oral Daily   apixaban  5 mg Oral BID   atorvastatin  40 mg Oral q1800   finasteride  5 mg Oral Daily   insulin aspart  0-15 Units Subcutaneous TID WC   metoprolol succinate  50 mg Oral Daily   multivitamin with minerals  1 tablet Oral Daily   omega-3 acid ethyl esters  2 g Oral Daily   pantoprazole  40 mg Oral Daily   [START ON 08/05/2022] potassium chloride SA  20 mEq Oral Daily   sodium chloride flush  3 mL Intravenous Q12H   spironolactone  12.5 mg Oral Daily   sucralfate  1 g Oral QID   tamsulosin  0.4 mg Oral q1800   [START ON 08/05/2022] torsemide  60 mg Oral Daily   Continuous Infusions:  sodium chloride       LOS: 4 days    Time spent: 25 mins     Wyvonnia Dusky, MD [facility] Pager [phone]  If 7PM-7AM, please contact night-coverage [email or web address] 08/04/2022, 9:03 AM

## 2022-08-06 ENCOUNTER — Other Ambulatory Visit: Payer: Self-pay | Admitting: Cardiovascular Disease

## 2022-08-11 ENCOUNTER — Ambulatory Visit: Payer: Medicare HMO | Attending: Family | Admitting: Family

## 2022-08-11 ENCOUNTER — Encounter: Payer: Self-pay | Admitting: Family

## 2022-08-11 VITALS — BP 132/70 | HR 81 | Resp 18 | Wt 220.0 lb

## 2022-08-11 DIAGNOSIS — E1122 Type 2 diabetes mellitus with diabetic chronic kidney disease: Secondary | ICD-10-CM | POA: Insufficient documentation

## 2022-08-11 DIAGNOSIS — D649 Anemia, unspecified: Secondary | ICD-10-CM | POA: Insufficient documentation

## 2022-08-11 DIAGNOSIS — I4821 Permanent atrial fibrillation: Secondary | ICD-10-CM | POA: Diagnosis not present

## 2022-08-11 DIAGNOSIS — I2510 Atherosclerotic heart disease of native coronary artery without angina pectoris: Secondary | ICD-10-CM | POA: Diagnosis not present

## 2022-08-11 DIAGNOSIS — I5032 Chronic diastolic (congestive) heart failure: Secondary | ICD-10-CM | POA: Diagnosis not present

## 2022-08-11 DIAGNOSIS — E785 Hyperlipidemia, unspecified: Secondary | ICD-10-CM | POA: Diagnosis not present

## 2022-08-11 DIAGNOSIS — G4730 Sleep apnea, unspecified: Secondary | ICD-10-CM | POA: Insufficient documentation

## 2022-08-11 DIAGNOSIS — I10 Essential (primary) hypertension: Secondary | ICD-10-CM | POA: Diagnosis not present

## 2022-08-11 DIAGNOSIS — E1142 Type 2 diabetes mellitus with diabetic polyneuropathy: Secondary | ICD-10-CM | POA: Diagnosis not present

## 2022-08-11 DIAGNOSIS — K219 Gastro-esophageal reflux disease without esophagitis: Secondary | ICD-10-CM | POA: Insufficient documentation

## 2022-08-11 DIAGNOSIS — N189 Chronic kidney disease, unspecified: Secondary | ICD-10-CM | POA: Diagnosis not present

## 2022-08-11 DIAGNOSIS — R634 Abnormal weight loss: Secondary | ICD-10-CM | POA: Insufficient documentation

## 2022-08-11 DIAGNOSIS — I130 Hypertensive heart and chronic kidney disease with heart failure and stage 1 through stage 4 chronic kidney disease, or unspecified chronic kidney disease: Secondary | ICD-10-CM | POA: Insufficient documentation

## 2022-08-11 DIAGNOSIS — E119 Type 2 diabetes mellitus without complications: Secondary | ICD-10-CM

## 2022-08-11 DIAGNOSIS — K449 Diaphragmatic hernia without obstruction or gangrene: Secondary | ICD-10-CM | POA: Insufficient documentation

## 2022-08-11 DIAGNOSIS — Z7901 Long term (current) use of anticoagulants: Secondary | ICD-10-CM | POA: Insufficient documentation

## 2022-08-11 DIAGNOSIS — I4891 Unspecified atrial fibrillation: Secondary | ICD-10-CM | POA: Insufficient documentation

## 2022-08-11 NOTE — Progress Notes (Signed)
Patient ID: Cody Price, male    DOB: 1941/01/01, 81 y.o.   MRN: [id number]  HPI  Cody Price is a 81 y/o male with a history of CAD, DM, hyperlipidemia, HTN, CKD, BPH, ED, GERD, hiatal hernia, anemia, PE, sleep apnea, spinal stenosis, atrial fibrillation,tobacco use and chronic heart failure.   Tee report from 03/30/22 reviewed and showed an EF of 55-60% along with moderate LAE, moderate Cody and mild/moderated TR.   RHC/LHC done 03/26/22 and showed:   Ost LM lesion is 20% stenosed.   Dist LAD lesion is 80% stenosed.   Prox LAD-1 lesion is 10% stenosed.   Mid LAD lesion is 10% stenosed.   Prox Cx lesion is 50% stenosed.   Dist RCA lesion is 100% stenosed.   Ost 1st Diag lesion is 90% stenosed.   Ost 3rd Mrg to 3rd Mrg lesion is 50% stenosed.   Prox RCA lesion is 100% stenosed.   Non-stenotic Prox LAD-2 lesion was previously treated.   There is moderate left ventricular systolic dysfunction.   LV end diastolic pressure is mildly elevated.   The left ventricular ejection fraction is 35-45% by visual estimate.  1.  Significant two-vessel coronary artery disease with patent LAD stents with minimal restenosis.  Chronically occluded right coronary artery with left-to-right collaterals.  Moderate left circumflex disease.  No significant change overall since most recent cardiac catheterization.  The coronary arteries are moderately calcified and diffusely diseased. 2.  Moderately reduced LV systolic function with an EF of 35 to 40%. 3.  Right heart catheterization showed moderately elevated filling pressures, mild pulmonary hypertension and normal cardiac output.  Pulmonary wedge pressure was 25 mmHg with very prominent V waves suggestive of significant mitral regurgitation.  Admitted 07/31/22 due to worsening shortness of breath over the last 1 week. Cardiology consult obtained. Elevated troponin thought to be due to demand ischemia. Discharged after 4 days.  He presents today for his initial visit  with a chief complaint of moderate fatigue upon minimal exertion. Describes this as chronic in nature. Has associated shortness of breath, edema in his knees and back pain along with this. Denies any dizziness, difficulty sleeping, abdominal distention, palpitations, chest pain, cough or weight gain.   Daughter does his pill box so he brought a med list but can't say for certainty what he takes. Is weighing daily and says that he continues to lose weight.   Not adding salt to his food. Says that he hasn't eaten out "recently".   Past Medical History:  Diagnosis Date   Arrhythmia    atrial fibrillation   Arthritis    degenerative back    BPH with obstruction/lower urinary tract symptoms    CAD (coronary artery disease) 09/06/2008   stent   CHF (congestive heart failure) (HCC)    Diabetes mellitus    ED (erectile dysfunction)    Elevated PSA    GERD (gastroesophageal reflux disease)    Gross hematuria    H/O CT scan    recent renal CT due to hematuria, cystoscopy - normal     Hematuria    History of hiatal hernia    History of kidney stones    HLD (hyperlipidemia)    Hypertension    Ischemic heart disease 09/06/1989   with angioplasty   Kidney stone    Morbid obesity (HCC)    Myocardial infarction [facility])    Peripheral neuropathy    Pernicious anemia    Pneumonia 09/06/1993   /w PE,  Pulmonary embolism ([place])    previous   Renal cyst    Sleep apnea    pt. denies sleep apnea, pt. states he has had 2 studies - last one 2011   Spinal stenosis     Past Surgical History:  Procedure Laterality Date   APPENDECTOMY     BACK SURGERY     x3 back surgeries - /w fusioin, 1- [place] 2- [facility]   bilateral inguinal hernia repair     cad     stent 2010   CARDIAC CATHETERIZATION  08/17/2014   CARPAL TUNNEL RELEASE  2014   bilateral    CHOLECYSTECTOMY     COLONOSCOPY WITH PROPOFOL N/A 07/11/2019   Procedure: COLONOSCOPY WITH PROPOFOL;  Surgeon: Toledo, Benay Pike, MD;  Location:  [facility] ENDOSCOPY;  Service: Gastroenterology;  Laterality: N/A;   CORONARY BALLOON ANGIOPLASTY N/A 07/11/2020   Procedure: CORONARY BALLOON ANGIOPLASTY;  Surgeon: Isaias Cowman, MD;  Location: [place] CV LAB;  Service: Cardiovascular;  Laterality: N/A;   CORONARY STENT INTERVENTION N/A 06/07/2018   Procedure: CORONARY STENT INTERVENTION;  Surgeon: Nelva Bush, MD;  Location: [place] [facility];  Service: Cardiovascular;  Laterality: N/A;   CORONARY STENT INTERVENTION N/A 07/11/2020   Procedure: CORONARY STENT INTERVENTION;  Surgeon: Isaias Cowman, MD;  Location: [place] CV LAB;  Service: Cardiovascular;  Laterality: N/A;   ESOPHAGOGASTRODUODENOSCOPY (EGD) WITH PROPOFOL N/A 07/11/2019   Procedure: ESOPHAGOGASTRODUODENOSCOPY (EGD) WITH PROPOFOL;  Surgeon: Toledo, Benay Pike, MD;  Location: [facility] ENDOSCOPY;  Service: Gastroenterology;  Laterality: N/A;   ESOPHAGOGASTRODUODENOSCOPY (EGD) WITH PROPOFOL N/A 03/24/2022   Procedure: ESOPHAGOGASTRODUODENOSCOPY (EGD) WITH PROPOFOL;  Surgeon: Annamaria Helling, DO;  Location: [place];  Service: Gastroenterology;  Laterality: N/A;   LAMINECTOMY     LEFT HEART CATH AND CORONARY ANGIOGRAPHY N/A 06/07/2018   Procedure: LEFT HEART CATH AND CORONARY ANGIOGRAPHY;  Surgeon: Nelva Bush, MD;  Location: [place] [facility];  Service: Cardiovascular;  Laterality: N/A;   LEFT HEART CATH AND CORONARY ANGIOGRAPHY N/A 08/26/2021   Procedure: LEFT HEART CATH AND CORONARY ANGIOGRAPHY;  Surgeon: Wellington Hampshire, MD;  Location: [place] [facility];  Service: Cardiovascular;  Laterality: N/A;   RIGHT/LEFT HEART CATH AND CORONARY ANGIOGRAPHY N/A 07/11/2020   Procedure: RIGHT/LEFT HEART CATH AND CORONARY ANGIOGRAPHY;  Surgeon: Minna Merritts, MD;  Location: [place] [facility];  Service: Cardiovascular;  Laterality: N/A;   RIGHT/LEFT HEART CATH AND CORONARY ANGIOGRAPHY N/A 03/26/2022   Procedure: RIGHT/LEFT HEART CATH AND CORONARY  ANGIOGRAPHY;  Surgeon: Wellington Hampshire, MD;  Location: [place] [facility];  Service: Cardiovascular;  Laterality: N/A;   TEE WITHOUT CARDIOVERSION N/A 03/30/2022   Procedure: TRANSESOPHAGEAL ECHOCARDIOGRAM (TEE);  Surgeon: Kate Sable, MD;  Location: [facility] ORS;  Service: Cardiovascular;  Laterality: N/A;   Family History  Problem Relation Age of Onset   Heart failure Mother    Heart disease Father    Skin cancer Sister    Heart attack Brother    Kidney disease Neg Hx    Prostate cancer Neg Hx    Kidney cancer Neg Hx    Bladder Cancer Neg Hx    Social History   Tobacco Use   Smoking status: Former    Packs/day: 3.00    Years: 15.00    Total pack years: 45.00    Types: Cigarettes    Quit date: 10/18/1985    Years since quitting: 36.8   Smokeless tobacco: Former   Tobacco comments:    tobacco use- no  Substance Use Topics   Alcohol use: Not Currently    Comment: rare   Allergies  Allergen Reactions   Novocain [Procaine] Other (See Comments)    Syncope   Procaine Hcl Other (See Comments)    Passes out   Lyrica [Pregabalin]    Neurontin [Gabapentin]    Amitriptyline Other (See Comments)    Sleepy   Dicyclomine Other (See Comments)    Other reaction(s): Abdominal Pain   Metformin Nausea Only   Nitroglycerin Other (See Comments)    Makes Blood pressure go to low.    Prior to Admission medications   Medication Sig Start Date End Date Taking? Authorizing Provider  acetaminophen (TYLENOL) 500 MG tablet Take 500 mg by mouth as needed.    Yes [provider]  allopurinol (ZYLOPRIM) 100 MG tablet Take 100 mg by mouth daily. 06/05/22  Yes [provider]  atorvastatin (LIPITOR) 40 MG tablet TAKE 1 TABLET BY MOUTH DAILY AT 6 PM. Patient taking differently: Take 40 mg by mouth daily. TAKE 1 TABLET BY MOUTH DAILY AT 6 PM. 05/24/22  Yes Gollan, Kathlene November, MD  Coenzyme Q10 (CO Q-10) 200 MG CAPS Take 200 mg by mouth daily.   Yes [provider]   cyanocobalamin (,VITAMIN B-12,) 1000 MCG/ML injection Inject 1,000 mcg into the muscle every 30 (thirty) days. 09/12/19  Yes [provider]  ELIQUIS 5 MG TABS tablet TAKE 1 TABLET BY MOUTH TWICE A DAY Patient taking differently: Take 5 mg by mouth 2 (two) times daily. 05/11/22  Yes Gollan, Kathlene November, MD  finasteride (PROSCAR) 5 MG tablet TAKE 1 TABLET BY MOUTH EVERY DAY Patient taking differently: Take 5 mg by mouth daily. 12/01/20  Yes McGowan, Larene Beach A, PA-C  glimepiride (AMARYL) 4 MG tablet Take 1 tablet (4 mg total) by mouth 2 (two) times daily. Hold this medication until you see your PCP as you were started on farxiga as per cardio 03/31/22  Yes Wyvonnia Dusky, MD  KLOR-CON M20 20 MEQ tablet Take 20 mEq by mouth daily. 12/31/21  Yes [provider]  metoprolol succinate (TOPROL-XL) 50 MG 24 hr tablet TAKE 1 TABLET BY MOUTH EVERY DAY WITH OR IMMEDIATELY FOLLOWING A MEAL Patient taking differently: Take 50 mg by mouth daily. 05/24/22  Yes Gollan, Kathlene November, MD  Multiple Vitamin (MULTIVITAMIN WITH MINERALS) TABS tablet Take 1 tablet by mouth daily. 06/08/18  Yes Wieting, Richard, MD  omega-3 acid ethyl esters (LOVAZA) 1 g capsule Take 2 g by mouth daily.   Yes [provider]  omeprazole (PRILOSEC) 40 MG capsule Take 40 mg by mouth 2 (two) times daily. 06/07/19  Yes [provider]  oxyCODONE (OXY IR/ROXICODONE) 5 MG immediate release tablet Take 5 mg by mouth 4 (four) times daily as needed. 03/22/22  Yes [provider]  sildenafil (VIAGRA) 100 MG tablet Take 100 mg by mouth daily as needed for erectile dysfunction.   Yes [provider]  spironolactone (ALDACTONE) 25 MG tablet Take 0.5 tablets (12.5 mg total) by mouth daily. 03/31/22  Yes Wyvonnia Dusky, MD  sucralfate (CARAFATE) 1 g tablet Take 1 g by mouth 4 (four) times daily. 03/13/22  Yes [provider]  tamsulosin (FLOMAX) 0.4 MG CAPS capsule TAKE 1 CAPSULE BY MOUTH EVERY DAY  12/01/20  Yes McGowan, Larene Beach A, PA-C  testosterone cypionate (DEPOTESTOSTERONE CYPIONATE) 200 MG/ML injection Inject 200 mg into the muscle every 28 (twenty-eight) days.   Yes [provider]  torsemide 60 MG  TABS Take 60 mg by mouth daily. 08/05/22 09/04/22 Yes Wyvonnia Dusky, MD  fluticasone (FLONASE) 50 MCG/ACT nasal spray Place 2 sprays into both nostrils daily. 08/29/21 11/27/21  [place] Eric J, DO   Review of Systems  Constitutional:  Positive for fatigue (easily). Negative for appetite change.  HENT:  Negative for congestion, postnasal drip and sore throat.   Eyes: Negative.   Respiratory:  Positive for shortness of breath. Negative for chest tightness.   Cardiovascular:  Positive for leg swelling (in both knees). Negative for chest pain and palpitations.  Gastrointestinal:  Negative for abdominal distention and abdominal pain.  Endocrine: Negative.   Genitourinary: Negative.   Musculoskeletal:  Positive for back pain (chronic pain). Negative for neck pain.  Skin: Negative.   Allergic/Immunologic: Negative.   Neurological:  Negative for dizziness and light-headedness.  Hematological:  Negative for adenopathy. Does not bruise/bleed easily.  Psychiatric/Behavioral:  Negative for dysphoric mood and sleep disturbance (sleeping on 1 pillow). The patient is not nervous/anxious.     Vitals:   08/11/22 1447  BP: 132/70  Pulse: 81  Resp: 18  SpO2: 100%  Weight: 220 lb (99.8 kg)   Wt Readings from Last 3 Encounters:  08/11/22 220 lb (99.8 kg)  08/04/22 223 lb 5.2 oz (101.3 kg)  04/08/22 230 lb (104.3 kg)   Lab Results  Component Value Date   CREATININE 0.84 08/04/2022   CREATININE 0.84 08/03/2022   CREATININE 0.86 08/02/2022   Physical Exam Vitals and nursing note reviewed.  Constitutional:      Appearance: He is well-developed.  HENT:     Head: Normocephalic and atraumatic.  Neck:     Vascular: No JVD.  Cardiovascular:     Rate and Rhythm: Normal rate.  Rhythm irregular.  Pulmonary:     Effort: Pulmonary effort is normal. No accessory muscle usage.     Breath sounds: No wheezing, rhonchi or rales.  Abdominal:     Palpations: Abdomen is soft.     Tenderness: There is no abdominal tenderness.  Musculoskeletal:     Cervical back: Normal range of motion and neck supple.     Right lower leg: No tenderness. No edema.     Left lower leg: No tenderness. No edema.  Skin:    General: Skin is warm and dry.  Neurological:     General: No focal deficit present.     Mental Status: He is alert and oriented to person, place, and time.  Psychiatric:        Mood and Affect: Mood normal.        Behavior: Behavior normal.    Assessment & Plan:  1: Chronic heart failure with preserved ejection fraction with structural changes (LAE)- - NYHA class III - euvolemic today - weighing daily and says that he continues to lose weight; understands to call for an overnight weight gain of > 2 pounds or a weekly weight gain of > 5 pounds - not adding salt to his food; says that he hasn't eaten out recently - unclear of his total fluid intake; to try and keep it at 60-64 ounces - saw cardiology Clayborn Bigness) 05/25/22; returns 08/16/22 - discussed adding entresto and / SGLT2 but he declines; he says that he takes "too many meds" already and is concerned about the cost (with medicare, he would probably not qualify for patient assistance) - BNP 07/31/22 was 729.6 - reports receiving his flu vaccine for this season  2: HTN- - BP looks good (132/70) -  saw PCP Edwina Barth) 07/26/22 - BMP 08/04/22 reviewed and showed sodium 136, potassium 4.3, creatinine 0.84 & GFR >60  3: DM- - A1c 06/28/22 was 6.5% - doesn't check his glucose at home  4: Atrial fibrillation- - taking apixaban and metorplol - currently rate controlled   Medication list reviewed.   Patient opts to not make a return appointment at this time. Advised him that he could call back at anytime for  questions or if he'd like to make another appointment and he was comfortable with this plan.

## 2022-08-11 NOTE — Patient Instructions (Addendum)
Continue weighing daily and call for an overnight weight gain of 3 pounds or more or a weekly weight gain of more than 5 pounds. ? ? ?If you have voicemail, please make sure your mailbox is cleaned out so that we may leave a message and please make sure to listen to any voicemails.  ? ? ?Call us in the future if you need us for anything ?

## 2022-09-15 ENCOUNTER — Other Ambulatory Visit: Payer: Self-pay | Admitting: Urology

## 2022-09-21 ENCOUNTER — Other Ambulatory Visit: Payer: Self-pay | Admitting: Cardiovascular Disease

## 2022-12-06 DEATH — deceased

## 2023-11-20 IMAGING — CT CT ANGIO HEAD-NECK (W OR W/O PERF)
2 of 11 series · 7 of 35 positions shown · IV contrast (APPLIED)
Comparison: None.

CLINICAL DATA: Headache, neck pain, possible aneurysm versus
dissection

EXAM:
CT ANGIOGRAPHY HEAD AND NECK
TECHNIQUE: Multidetector CT imaging of the head and neck was performed using
the standard protocol during bolus administration of intravenous
contrast. Multiplanar CT image reconstructions and MIPs were
obtained to evaluate the vascular anatomy. Carotid stenosis
measurements (when applicable) are obtained utilizing NASCET
criteria, using the distal internal carotid diameter as the
denominator.

[Series 6: cta head neck · axial · 0.56mm/px · z∈[+370,+500]mm · 2 of 195 slices shown]
[im 65/195  soft-tissue]
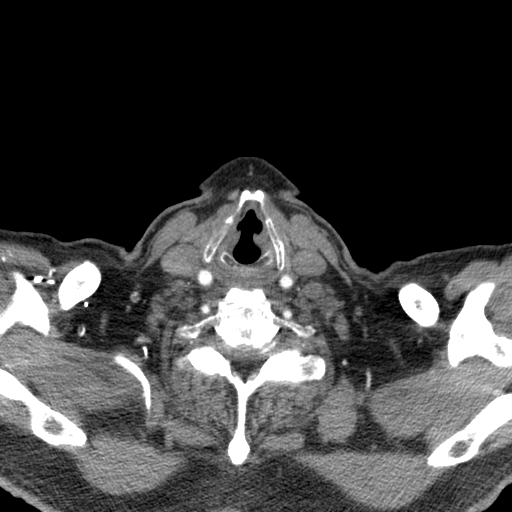
[im 130/195  soft-tissue]
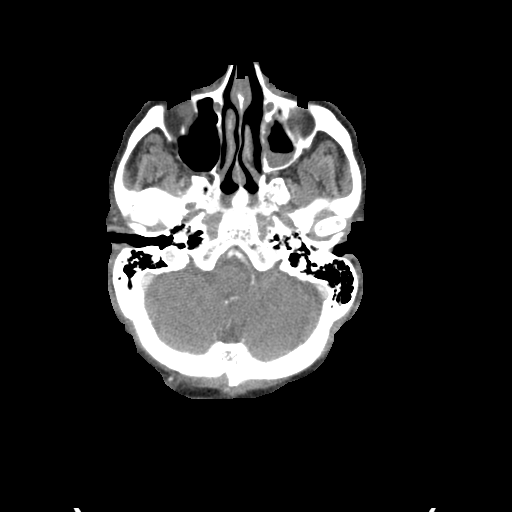

[Series 10: ax thin · axial · 0.60mm/px · z∈[+295,+555]mm · 5 of 391 slices shown]
[im 66/391  soft-tissue]
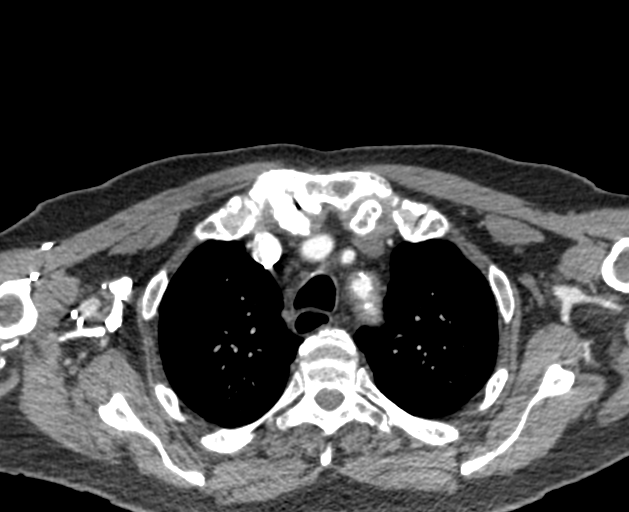
[im 131/391  bone]
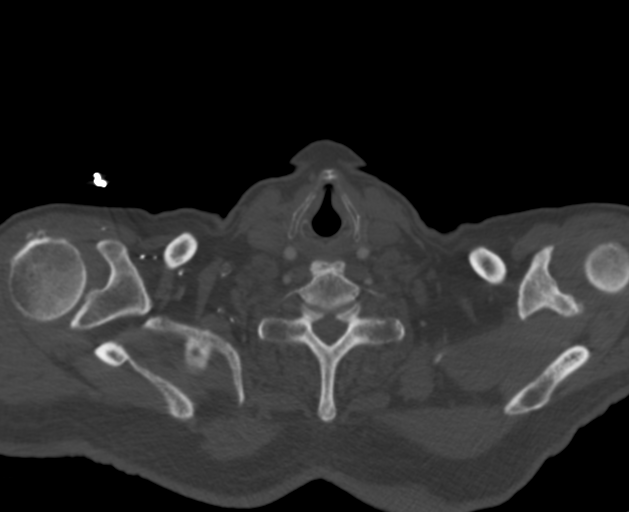
[im 196/391  soft-tissue]
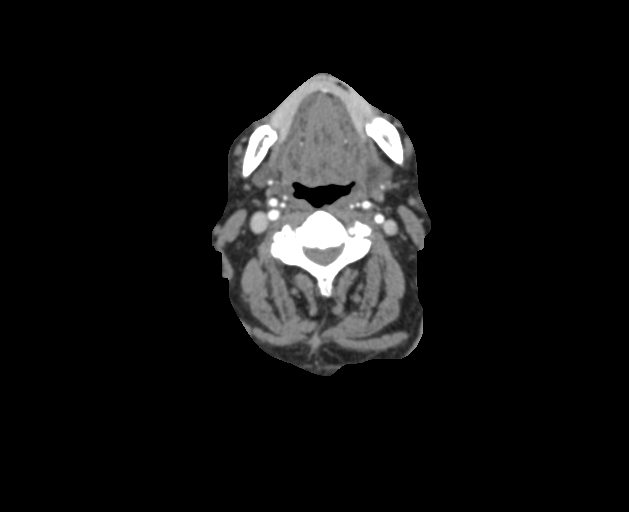
[im 261/391  bone]
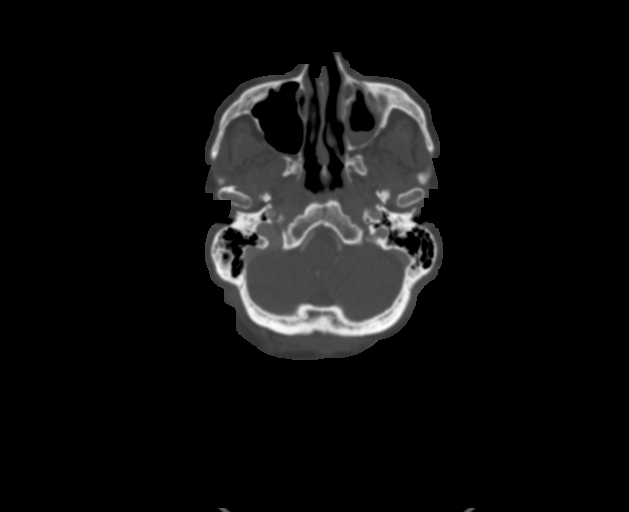
[im 326/391  soft-tissue]
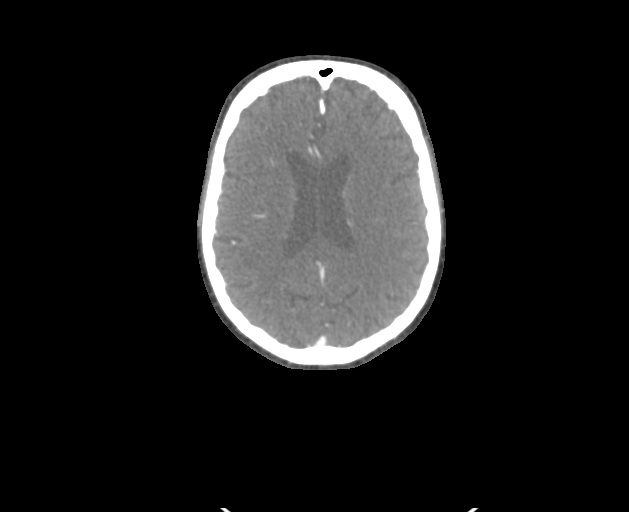

[7 of 35 positions shown; findings below may reference images not displayed]

RADIATION DOSE REDUCTION: This exam was performed according to the
departmental dose-optimization program which includes automated
exposure control, adjustment of the mA and/or kV according to
patient size and/or use of iterative reconstruction technique.

CONTRAST:  75mL OMNIPAQUE IOHEXOL 350 MG/ML SOLN
FINDINGS: CT HEAD FINDINGS

Brain: There is no evidence of acute intracranial hemorrhage,
extra-axial fluid collection, or acute infarct.

There is mild global parenchymal volume loss. The ventricles are
normal in size. Gray-white differentiation is preserved. Scattered
foci of hypodensity in the subcortical and periventricular white
matter likely reflect sequela of mild chronic white matter
microangiopathy. There is no mass lesion. There is no midline shift.

Vascular: There is calcification of the bilateral cavernous ICAs.

Skull: Normal. Negative for fracture or focal lesion.

Sinuses and orbits: There is layering fluid in the left maxillary
sinus. The globes and orbits are unremarkable.

Other: None.

Review of the MIP images confirms the above findings

CTA NECK FINDINGS

Aortic arch: There is mild calcified atherosclerotic plaque of the
aortic arch. The origins of the major branch vessels are patent. The
subclavian arteries are patent.

Right carotid system: The right common carotid artery is patent.
There is minimal calcified atherosclerotic plaque at the right
carotid bulb without hemodynamically significant stenosis or
occlusion. The right internal and external carotid arteries are
patent. There is no dissection or aneurysm.

Left carotid system: The left common carotid artery is patent. There
is mild soft and calcified atherosclerotic plaque at the left
carotid bulb resulting in approximally 40-50% stenosis. The the
distal left internal carotid artery and left external carotid artery
are patent. There is no dissection or aneurysm.

Vertebral arteries: There is mild calcified atherosclerotic plaque
at the vertebral artery origins without significant stenosis or
occlusion. Otherwise, the vertebral arteries are patent, without
hemodynamically significant stenosis or occlusion. There is no
dissection or aneurysm.

Skeleton: There is multilevel degenerative change of the cervical
spine. With a prominent disc protrusion at C4-C5 resulting in at
least moderate spinal canal stenosis with probable mass effect on
the cord. There is no visible canal hematoma. There is no acute
osseous abnormality or aggressive osseous lesion.

Other neck: The soft tissues are unremarkable.

Upper chest: The imaged lung apices are clear.

Review of the MIP images confirms the above findings

CTA HEAD FINDINGS

Anterior circulation: There is calcified atherosclerotic plaque of
the bilateral intracranial ICAs without high-grade stenosis or
occlusion. The bilateral MCAs are patent

The bilateral ACAs are patent.

There is no aneurysm or AVM.

Posterior circulation: There is calcified atherosclerotic plaque in
the left V4 segment resulting in mild stenosis. The right V4 segment
is patent. PICA is identified bilaterally. The basilar artery is
patent.

The bilateral PCAs are patent. The right posterior communicating
artery is identified. The left posterior communicating artery is not
seen.

There is no aneurysm or AVM.

Venous sinuses: Patent.

Anatomic variants: None.

Review of the MIP images confirms the above findings
IMPRESSION: 1. No acute intracranial pathology.
2. No evidence of dissection, aneurysm, or other acute vascular
pathology.
3. Soft and calcified atherosclerotic plaque in the left carotid
bulb resulting in approximately 40-50% stenosis by NASCET criteria.
Mild plaque at the right carotid bulb without hemodynamically
significant stenosis. Patent vertebral arteries.
4. Calcified atherosclerotic plaque in the bilateral intracranial
ICAs and left V4 segment resulting in no more than mild stenosis.
Otherwise, patent intracranial vasculature.
5. Layering fluid in the left maxillary sinus which can be seen with
acute sinusitis in the correct clinical setting.
6. Degenerative changes in the cervical spine with at least moderate
spinal canal stenosis at C4-C5 with probable mass effect on the
cord. This could be further evaluated with cervical spine MRI as
indicated.

Aortic Atherosclerosis ([id number]).
